# Patient Record
Sex: Female | Born: 1935 | Race: White | Hispanic: No | Marital: Married | Smoking: Former smoker
Health system: Southern US, Community
[De-identification: ages and names within clinical notes are randomized; demographics above are authoritative.]

## PROBLEM LIST (undated history)

## (undated) DIAGNOSIS — I350 Nonrheumatic aortic (valve) stenosis: Secondary | ICD-10-CM

## (undated) DIAGNOSIS — I2510 Atherosclerotic heart disease of native coronary artery without angina pectoris: Secondary | ICD-10-CM

## (undated) DIAGNOSIS — K922 Gastrointestinal hemorrhage, unspecified: Secondary | ICD-10-CM

## (undated) DIAGNOSIS — E785 Hyperlipidemia, unspecified: Secondary | ICD-10-CM

## (undated) DIAGNOSIS — E669 Obesity, unspecified: Secondary | ICD-10-CM

---
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
## 2016-08-12 DIAGNOSIS — I2510 Atherosclerotic heart disease of native coronary artery without angina pectoris: Secondary | ICD-10-CM

## 2016-08-12 HISTORY — DX: Atherosclerotic heart disease of native coronary artery without angina pectoris: I25.10

## 2017-06-01 ENCOUNTER — Encounter
Admission: EM | Disposition: A | Discharge status: Home / Self Care | Payer: Self-pay | Source: Home / Self Care | Attending: Internal Medicine

## 2017-06-01 ENCOUNTER — Inpatient Hospital Stay
Admission: EM | Admit: 2017-06-01 | Discharge: 2017-06-04 | DRG: 247 | Disposition: A | Discharge status: Home / Self Care | Payer: Medicare Other | Attending: Internal Medicine | Admitting: Internal Medicine

## 2017-06-01 DIAGNOSIS — I2119 ST elevation (STEMI) myocardial infarction involving other coronary artery of inferior wall: Principal | ICD-10-CM | POA: Diagnosis present

## 2017-06-01 DIAGNOSIS — F17210 Nicotine dependence, cigarettes, uncomplicated: Secondary | ICD-10-CM | POA: Diagnosis present

## 2017-06-01 DIAGNOSIS — R079 Chest pain, unspecified: Secondary | ICD-10-CM | POA: Diagnosis not present

## 2017-06-01 DIAGNOSIS — M79604 Pain in right leg: Secondary | ICD-10-CM

## 2017-06-01 DIAGNOSIS — N179 Acute kidney failure, unspecified: Secondary | ICD-10-CM | POA: Diagnosis present

## 2017-06-01 DIAGNOSIS — I350 Nonrheumatic aortic (valve) stenosis: Secondary | ICD-10-CM | POA: Diagnosis present

## 2017-06-01 DIAGNOSIS — I959 Hypotension, unspecified: Secondary | ICD-10-CM | POA: Diagnosis present

## 2017-06-01 DIAGNOSIS — R739 Hyperglycemia, unspecified: Secondary | ICD-10-CM | POA: Diagnosis not present

## 2017-06-01 DIAGNOSIS — Z9889 Other specified postprocedural states: Secondary | ICD-10-CM

## 2017-06-01 DIAGNOSIS — I213 ST elevation (STEMI) myocardial infarction of unspecified site: Secondary | ICD-10-CM

## 2017-06-01 DIAGNOSIS — M7989 Other specified soft tissue disorders: Secondary | ICD-10-CM

## 2017-06-01 DIAGNOSIS — E785 Hyperlipidemia, unspecified: Secondary | ICD-10-CM | POA: Diagnosis present

## 2017-06-01 DIAGNOSIS — Z8249 Family history of ischemic heart disease and other diseases of the circulatory system: Secondary | ICD-10-CM

## 2017-06-01 DIAGNOSIS — R109 Unspecified abdominal pain: Secondary | ICD-10-CM

## 2017-06-01 DIAGNOSIS — Z955 Presence of coronary angioplasty implant and graft: Secondary | ICD-10-CM

## 2017-06-01 DIAGNOSIS — I2510 Atherosclerotic heart disease of native coronary artery without angina pectoris: Secondary | ICD-10-CM | POA: Diagnosis present

## 2017-06-01 HISTORY — DX: Atherosclerotic heart disease of native coronary artery without angina pectoris: I25.10

## 2017-06-01 HISTORY — PX: CORONARY STENT INTERVENTION: CATH118234

## 2017-06-01 HISTORY — PX: LEFT HEART CATH AND CORONARY ANGIOGRAPHY: CATH118249

## 2017-06-01 LAB — LIPID PANEL
CHOLESTEROL: 240 mg/dL — AB (ref 0–200)
HDL: 52 mg/dL (ref >40)
LDL CALC: 158 mg/dL — AB (ref 0–99)
Total CHOL/HDL Ratio: 4.6 RATIO
Triglycerides: 148 mg/dL (ref <150)
VLDL: 30 mg/dL (ref 0–40)

## 2017-06-01 LAB — CBC WITH DIFFERENTIAL/PLATELET
BASOS ABS: 0.1 10*3/uL (ref 0–0.1)
Basophils Relative: 1 %
EOS PCT: 3 %
Eosinophils Absolute: 0.3 10*3/uL (ref 0–0.7)
HCT: 46.6 % (ref 35.0–47.0)
Hemoglobin: 15.5 g/dL (ref 12.0–16.0)
LYMPHS ABS: 4.1 10*3/uL — AB (ref 1.0–3.6)
LYMPHS PCT: 39 %
MCH: 29.9 pg (ref 26.0–34.0)
MCHC: 33.2 g/dL (ref 32.0–36.0)
MCV: 90.2 fL (ref 80.0–100.0)
MONO ABS: 1 10*3/uL — AB (ref 0.2–0.9)
Monocytes Relative: 9 %
Neutro Abs: 5.1 10*3/uL (ref 1.4–6.5)
Neutrophils Relative %: 48 %
PLATELETS: 246 10*3/uL (ref 150–440)
RBC: 5.17 MIL/uL (ref 3.80–5.20)
RDW: 13.7 % (ref 11.5–14.5)
WBC: 10.7 10*3/uL (ref 3.6–11.0)

## 2017-06-01 LAB — COMPREHENSIVE METABOLIC PANEL
ALK PHOS: 61 U/L (ref 38–126)
ALT: 14 U/L (ref 14–54)
AST: 21 U/L (ref 15–41)
Albumin: 4.2 g/dL (ref 3.5–5.0)
Anion gap: 9 (ref 5–15)
BUN: 24 mg/dL — AB (ref 6–20)
CALCIUM: 9.6 mg/dL (ref 8.9–10.3)
CO2: 25 mmol/L (ref 22–32)
CREATININE: 1.25 mg/dL — AB (ref 0.44–1.00)
Chloride: 104 mmol/L (ref 101–111)
GFR calc non Af Amer: 39 mL/min — ABNORMAL LOW (ref >60)
GFR, EST AFRICAN AMERICAN: 45 mL/min — AB (ref >60)
Glucose, Bld: 162 mg/dL — ABNORMAL HIGH (ref 65–99)
Potassium: 3.8 mmol/L (ref 3.5–5.1)
SODIUM: 138 mmol/L (ref 135–145)
Total Bilirubin: 1.1 mg/dL (ref 0.3–1.2)
Total Protein: 8 g/dL (ref 6.5–8.1)

## 2017-06-01 LAB — PROTIME-INR
INR: 0.99
Prothrombin Time: 13 seconds (ref 11.4–15.2)

## 2017-06-01 LAB — APTT: aPTT: 34 seconds (ref 24–36)

## 2017-06-01 LAB — TROPONIN I: Troponin I: 0.03 ng/mL (ref <0.03)

## 2017-06-01 SURGERY — LEFT HEART CATH AND CORONARY ANGIOGRAPHY
Anesthesia: Moderate Sedation

## 2017-06-01 MED ORDER — TICAGRELOR 90 MG PO TABS
ORAL_TABLET | ORAL | Status: DC | PRN
  Administered 2017-06-01: 180 mg via ORAL

## 2017-06-01 MED ORDER — BIVALIRUDIN BOLUS VIA INFUSION - CUPID
INTRAVENOUS | Status: DC | PRN
  Administered 2017-06-01: 68.025 mg via INTRAVENOUS

## 2017-06-01 MED ORDER — BIVALIRUDIN TRIFLUOROACETATE 250 MG IV SOLR
INTRAVENOUS | Status: AC | PRN
  Administered 2017-06-01: 1.75 mg/kg/h via INTRAVENOUS

## 2017-06-01 MED ORDER — VERAPAMIL HCL 2.5 MG/ML IV SOLN
INTRAVENOUS | Status: DC | PRN
  Administered 2017-06-01: 2.5 mg via INTRAVENOUS

## 2017-06-01 MED ORDER — SODIUM CHLORIDE 0.9 % IV SOLN: INTRAVENOUS | Status: DC

## 2017-06-01 MED ORDER — HEPARIN BOLUS VIA INFUSION
4000.0000 [IU] | Freq: Once | INTRAVENOUS | Status: AC
  Administered 2017-06-01: 4000 [IU] via INTRAVENOUS

## 2017-06-01 MED ORDER — VERAPAMIL HCL 2.5 MG/ML IV SOLN
INTRAVENOUS | Status: AC
  Filled 2017-06-01: qty 2

## 2017-06-01 MED ORDER — LIDOCAINE HCL (PF) 1 % IJ SOLN
INTRAMUSCULAR | Status: AC
  Filled 2017-06-01: qty 30

## 2017-06-01 MED ORDER — TICAGRELOR 90 MG PO TABS
ORAL_TABLET | ORAL | Status: AC
  Filled 2017-06-01: qty 2

## 2017-06-01 MED ORDER — ATROPINE SULFATE 1 MG/10ML IJ SOSY
PREFILLED_SYRINGE | INTRAMUSCULAR | Status: AC
  Filled 2017-06-01: qty 10

## 2017-06-01 MED ORDER — ATROPINE SULFATE 1 MG/10ML IJ SOSY
PREFILLED_SYRINGE | INTRAMUSCULAR | Status: DC | PRN
  Administered 2017-06-01: 1 mg via INTRAVENOUS

## 2017-06-01 MED ORDER — NITROGLYCERIN 5 MG/ML IV SOLN
INTRAVENOUS | Status: AC
Start: 2017-06-01 — End: ?
  Filled 2017-06-01: qty 10

## 2017-06-01 MED ORDER — HEPARIN SODIUM (PORCINE) 5000 UNIT/ML IJ SOLN
INTRAMUSCULAR | Status: AC
  Filled 2017-06-01: qty 1

## 2017-06-01 MED ORDER — NOREPINEPHRINE BITARTRATE 1 MG/ML IV SOLN
INTRAVENOUS | Status: AC
  Filled 2017-06-01: qty 4

## 2017-06-01 MED ORDER — DEXTROSE 5 % IV SOLN
INTRAVENOUS | Status: DC | PRN
  Administered 2017-06-01: 10 ug/min via INTRAVENOUS

## 2017-06-01 MED ORDER — ASPIRIN 81 MG PO CHEW: 324.0000 mg | CHEWABLE_TABLET | Freq: Once | ORAL | Status: DC

## 2017-06-01 MED ORDER — BIVALIRUDIN TRIFLUOROACETATE 250 MG IV SOLR
INTRAVENOUS | Status: AC
  Filled 2017-06-01: qty 250

## 2017-06-01 SURGICAL SUPPLY — 23 items
BALLN TREK RX 2.5X12 (BALLOONS) ×3
BALLN TREK RX 2.75X20 (BALLOONS) ×3
BALLN ~~LOC~~ EUPHORA RX 4.0X20 (BALLOONS) ×3
BALLOON TREK RX 2.5X12 (BALLOONS) ×1 IMPLANT
BALLOON TREK RX 2.75X20 (BALLOONS) ×1 IMPLANT
BALLOON ~~LOC~~ EUPHORA RX 4.0X20 (BALLOONS) ×1 IMPLANT
CATH INFINITI 5FR ANG PIGTAIL (CATHETERS) IMPLANT
CATH INFINITI 5FR JL4 (CATHETERS) ×3 IMPLANT
CATH LAUNCHER 6FR JR4 (CATHETERS) ×3 IMPLANT
DEVICE CLOSURE MYNXGRIP 6/7F (Vascular Products) ×3 IMPLANT
DEVICE INFLAT 30 PLUS (MISCELLANEOUS) ×3 IMPLANT
DEVICE RAD COMP TR BAND LRG (VASCULAR PRODUCTS) ×3 IMPLANT
GLIDESHEATH SLEND SS 6F .021 (SHEATH) ×3 IMPLANT
KIT MANI 3VAL PERCEP (MISCELLANEOUS) ×3 IMPLANT
NEEDLE PERC 18GX7CM (NEEDLE) ×3 IMPLANT
PACK CARDIAC CATH (CUSTOM PROCEDURE TRAY) ×3 IMPLANT
SHEATH AVANTI 6FR X 11CM (SHEATH) ×3 IMPLANT
STENT SIERRA 3.50 X 33 MM (Permanent Stent) ×3 IMPLANT
WIRE EMERALD 3MM-J .035X150CM (WIRE) ×3 IMPLANT
WIRE EMERALD ST .035X150CM (WIRE) ×3 IMPLANT
WIRE HITORQ VERSACORE ST 145CM (WIRE) ×3 IMPLANT
WIRE ROSEN-J .035X260CM (WIRE) ×3 IMPLANT
WIRE RUNTHROUGH .014X180CM (WIRE) ×3 IMPLANT

## 2017-06-01 NOTE — ED Triage Notes (Signed)
Pt arrives via [facility] with Code Stemi. Pt had STEMI on [facility] 12 lead. Pt reports chest pain 2-3 days with a sudden increase in the last three hours. EMS administered 324 mg baby aspirin en route. Pt is pale and diaphoretic at this time in triage.

## 2017-06-01 NOTE — ED Provider Notes (Signed)
[facility] Emergency Department Provider Note   ____________________________________________   First MD Initiated Contact with Patient 06/01/17 2229     (approximate)  I have reviewed the triage vital signs and the nursing notes.   HISTORY  Chief Complaint Code STEMI  EM caveat: Limited somewhat by time in patients time sensitive need for intervention and catheterization  HPI Monica [name] is a 83 y.o. female here for evaluation of chest pain  Patient presents for evaluation of chest pain.  Felt as though she had acid reflux this morning but began having worsening chest pain tonight.  Seen by EMS, given 324 of aspirin noted to have mild hypotension in route.  Patient activated as a STEMI  Patient reports she only takes ibuprofen, no other medications and denies significant medical history.  She is a smoker.  No trouble breathing.  No back pain.  No abdominal pain.  No headache nausea or vomiting.  No past medical history on file. Denies previous history There are no active problems to display for this patient.   No past surgical history on file.  Prior to Admission medications   Not on File  Takes no medication  Allergies Patient has no known allergies.  No family history on file.  Social History Social History  Substance Use Topics  . Smoking status: Not on file  . Smokeless tobacco: Not on file  . Alcohol use Not on file  Does smoke, no illegal drug use no illegal drug  Review of Systems Constitutional: No fever/chills Respiratory: Denies shortness of breath. Gastrointestinal: No abdominal pain.  No nausea, no vomiting.  Musculoskeletal: Negative for back pain. Neurological: Negative for headaches  EM caveat    ____________________________________________   PHYSICAL EXAM:  VITAL SIGNS: ED Triage Vitals  Enc Vitals Group     BP 06/01/17 2232 (!) 155/107     Pulse Rate 06/01/17 2232 (!) 107     Resp --      Temp  06/01/17 2232 (!) 97.5 F (36.4 C)     Temp Source 06/01/17 2232 Oral     SpO2 06/01/17 2232 98 %     Weight 06/01/17 2235 200 lb (90.7 kg)     Height 06/01/17 2235 5\' 3"  (1.6 m)     Head Circumference --      Peak Flow --      Pain Score 06/01/17 2228 10     Pain Loc --      Pain Edu? --      Excl. in GC? --     Constitutional: Alert and oriented.  Mildly ill-appearing but in no distress eyes: Conjunctivae are normal. Head: Atraumatic. Neck: No stridor.   Cardiovascular: Normal rate, regular rhythm. Grossly normal heart sounds.  Good peripheral circulation. Respiratory: Normal respiratory effort.  No retractions. Lungs CTAB for may be some very slight wheezing in the lower lobes bilateral.  Speaks in full clear sentences Gastrointestinal: Soft and nontender. Musculoskeletal: No lower extremity tenderness nor edema. Neurologic:  Normal speech and language. No gross focal neurologic deficits are appreciated.  Skin:  Skin is warm, dry and intact. No rash noted. Psychiatric: Mood and affect are normal.  ____________________________________________   LABS (all labs ordered are listed, but only abnormal results are displayed)  Labs Reviewed  CBC WITH DIFFERENTIAL/PLATELET - Abnormal; Notable for the following:       Result Value   Lymphs Abs 4.1 (*)    Monocytes Absolute 1.0 (*)    All other  components within normal limits  PROTIME-INR  APTT  COMPREHENSIVE METABOLIC PANEL  TROPONIN I  LIPID PANEL   ____________________________________________  EKG  Reviewed and entered by me at 2232 Heart rate 105 QRS 90 QTC 420 Sinus tachycardia, obvious ST elevation involving the inferior and anterior region ____________________________________________  RADIOLOGY   ____________________________________________   PROCEDURES  Procedure(s) performed: None  Procedures  Critical Care performed: Yes, see critical care note(s)  CRITICAL CARE Performed by: Sharyn CreamerQUALE,  Violette Morneault   Total critical care time: 20 minutes  Critical care time was exclusive of separately billable procedures and treating other patients.  Critical care was necessary to treat or prevent imminent or life-threatening deterioration.  Critical care was time spent personally by me on the following activities: development of treatment plan with patient and/or surrogate as well as nursing, discussions with consultants, evaluation of patient's response to treatment, examination of patient, obtaining history from patient or surrogate, ordering and performing treatments and interventions, ordering and review of laboratory studies, ordering and review of radiographic studies, pulse oximetry and re-evaluation of patient's condition.  ____________________________________________   INITIAL IMPRESSION / ASSESSMENT AND PLAN / ED COURSE  Pertinent labs & imaging results that were available during my care of the patient were reviewed by me and considered in my medical decision making (see chart for details).    Clinical Course as of Jun 01 2253  Wynelle LinkSun Jun 01, 2017  2229 Dr. Kirke CorinArida at bedside.  [MQ]    Clinical Course User Index [MQ] Sharyn CreamerQuale, Miamor Ayler, MD   Patient presents for chest pain with EKG demonstrating ST elevation MI.  Patient given heparin bolus per recommendation of Dr. Kirke CorinArida.  Patient being taken for emergency cath with Dr. Kirke CorinArida.  The patient was alert, stable hemodynamics at the time of transfer to the Cath Lab with the Cath Lab team.  ____________________________________________   FINAL CLINICAL IMPRESSION(S) / ED DIAGNOSES  Final diagnoses:  ST elevation myocardial infarction (STEMI), unspecified artery (HCC)      NEW MEDICATIONS STARTED DURING THIS VISIT:  There are no discharge medications for this patient.    Note:  This document was prepared using Dragon voice recognition software and may include unintentional dictation errors.     Sharyn CreamerQuale, Kariel Skillman, MD 06/01/17 [phone]

## 2017-06-01 NOTE — ED Notes (Signed)
Pt transported to Cath Lab at this time with this RN, Mackey BirchwoodPaulette RN, and MD Kirke CorinArida.

## 2017-06-02 ENCOUNTER — Encounter: Payer: Self-pay | Admitting: Cardiovascular Disease

## 2017-06-02 ENCOUNTER — Inpatient Hospital Stay (HOSPITAL_COMMUNITY)
Admit: 2017-06-02 | Discharge: 2017-06-02 | Disposition: A | Discharge status: Home / Self Care | Payer: Medicare Other | Attending: Cardiovascular Disease | Admitting: Cardiovascular Disease

## 2017-06-02 ENCOUNTER — Inpatient Hospital Stay: Payer: Medicare Other

## 2017-06-02 DIAGNOSIS — I340 Nonrheumatic mitral (valve) insufficiency: Secondary | ICD-10-CM

## 2017-06-02 DIAGNOSIS — I25118 Atherosclerotic heart disease of native coronary artery with other forms of angina pectoris: Secondary | ICD-10-CM | POA: Diagnosis not present

## 2017-06-02 DIAGNOSIS — I2510 Atherosclerotic heart disease of native coronary artery without angina pectoris: Secondary | ICD-10-CM | POA: Diagnosis not present

## 2017-06-02 DIAGNOSIS — N179 Acute kidney failure, unspecified: Secondary | ICD-10-CM | POA: Diagnosis present

## 2017-06-02 DIAGNOSIS — I2111 ST elevation (STEMI) myocardial infarction involving right coronary artery: Secondary | ICD-10-CM | POA: Diagnosis not present

## 2017-06-02 DIAGNOSIS — Z8249 Family history of ischemic heart disease and other diseases of the circulatory system: Secondary | ICD-10-CM | POA: Diagnosis not present

## 2017-06-02 DIAGNOSIS — I351 Nonrheumatic aortic (valve) insufficiency: Secondary | ICD-10-CM

## 2017-06-02 DIAGNOSIS — K922 Gastrointestinal hemorrhage, unspecified: Secondary | ICD-10-CM | POA: Diagnosis not present

## 2017-06-02 DIAGNOSIS — M79604 Pain in right leg: Secondary | ICD-10-CM | POA: Diagnosis not present

## 2017-06-02 DIAGNOSIS — I2119 ST elevation (STEMI) myocardial infarction involving other coronary artery of inferior wall: Secondary | ICD-10-CM | POA: Diagnosis present

## 2017-06-02 DIAGNOSIS — R112 Nausea with vomiting, unspecified: Secondary | ICD-10-CM | POA: Diagnosis not present

## 2017-06-02 DIAGNOSIS — E785 Hyperlipidemia, unspecified: Secondary | ICD-10-CM | POA: Diagnosis present

## 2017-06-02 DIAGNOSIS — F17210 Nicotine dependence, cigarettes, uncomplicated: Secondary | ICD-10-CM | POA: Diagnosis present

## 2017-06-02 DIAGNOSIS — R739 Hyperglycemia, unspecified: Secondary | ICD-10-CM | POA: Diagnosis not present

## 2017-06-02 DIAGNOSIS — Z955 Presence of coronary angioplasty implant and graft: Secondary | ICD-10-CM | POA: Diagnosis not present

## 2017-06-02 DIAGNOSIS — I350 Nonrheumatic aortic (valve) stenosis: Secondary | ICD-10-CM | POA: Diagnosis present

## 2017-06-02 DIAGNOSIS — R197 Diarrhea, unspecified: Secondary | ICD-10-CM | POA: Diagnosis not present

## 2017-06-02 DIAGNOSIS — R1030 Lower abdominal pain, unspecified: Secondary | ICD-10-CM | POA: Diagnosis not present

## 2017-06-02 DIAGNOSIS — M7989 Other specified soft tissue disorders: Secondary | ICD-10-CM | POA: Diagnosis not present

## 2017-06-02 DIAGNOSIS — R079 Chest pain, unspecified: Secondary | ICD-10-CM | POA: Diagnosis present

## 2017-06-02 DIAGNOSIS — I959 Hypotension, unspecified: Secondary | ICD-10-CM | POA: Diagnosis present

## 2017-06-02 LAB — BASIC METABOLIC PANEL WITH GFR
Anion gap: 3 — ABNORMAL LOW (ref 5–15)
BUN: 22 mg/dL — ABNORMAL HIGH (ref 6–20)
CO2: 24 mmol/L (ref 22–32)
Calcium: 8.8 mg/dL — ABNORMAL LOW (ref 8.9–10.3)
Chloride: 109 mmol/L (ref 101–111)
Creatinine, Ser: 1.03 mg/dL — ABNORMAL HIGH (ref 0.44–1.00)
GFR calc Af Amer: 57 mL/min — ABNORMAL LOW
GFR calc non Af Amer: 50 mL/min — ABNORMAL LOW
Glucose, Bld: 120 mg/dL — ABNORMAL HIGH (ref 65–99)
Potassium: 4.3 mmol/L (ref 3.5–5.1)
Sodium: 136 mmol/L (ref 135–145)

## 2017-06-02 LAB — CBC
HCT: 41.1 % (ref 35.0–47.0)
Hemoglobin: 13.8 g/dL (ref 12.0–16.0)
MCH: 30.3 pg (ref 26.0–34.0)
MCHC: 33.5 g/dL (ref 32.0–36.0)
MCV: 90.4 fL (ref 80.0–100.0)
PLATELETS: 216 10*3/uL (ref 150–440)
RBC: 4.55 MIL/uL (ref 3.80–5.20)
RDW: 13.5 % (ref 11.5–14.5)
WBC: 9.8 10*3/uL (ref 3.6–11.0)

## 2017-06-02 LAB — ECHOCARDIOGRAM COMPLETE
HEIGHTINCHES: 63 in
WEIGHTICAEL: 3200 [oz_av]

## 2017-06-02 LAB — MRSA PCR SCREENING: MRSA by PCR: NEGATIVE

## 2017-06-02 LAB — POCT ACTIVATED CLOTTING TIME: ACTIVATED CLOTTING TIME: 279 s

## 2017-06-02 LAB — PHOSPHORUS: Phosphorus: 3.6 mg/dL (ref 2.5–4.6)

## 2017-06-02 LAB — GLUCOSE, CAPILLARY: GLUCOSE-CAPILLARY: 130 mg/dL — AB (ref 65–99)

## 2017-06-02 LAB — HEMOGLOBIN A1C
Hgb A1c MFr Bld: 5.7 % — ABNORMAL HIGH (ref 4.8–5.6)
Mean Plasma Glucose: 116.89 mg/dL

## 2017-06-02 LAB — MAGNESIUM: Magnesium: 2 mg/dL (ref 1.7–2.4)

## 2017-06-02 LAB — TROPONIN I: Troponin I: 13 ng/mL

## 2017-06-02 MED ORDER — IOPAMIDOL (ISOVUE-300) INJECTION 61%
INTRAVENOUS | Status: DC | PRN
  Administered 2017-06-02: 150 mL via INTRA_ARTERIAL

## 2017-06-02 MED ORDER — ONDANSETRON HCL 4 MG/2ML IJ SOLN
4.0000 mg | Freq: Four times a day (QID) | INTRAMUSCULAR | Status: DC | PRN
  Filled 2017-06-02: qty 2

## 2017-06-02 MED ORDER — SODIUM CHLORIDE 0.9 % IV BOLUS (SEPSIS)
500.0000 mL | Freq: Once | INTRAVENOUS | Status: AC
  Administered 2017-06-02: 500 mL via INTRAVENOUS

## 2017-06-02 MED ORDER — SODIUM CHLORIDE 0.9 % IV BOLUS (SEPSIS): 1000.0000 mL | Freq: Once | INTRAVENOUS | Status: DC

## 2017-06-02 MED ORDER — MORPHINE SULFATE (PF) 2 MG/ML IV SOLN
2.0000 mg | INTRAVENOUS | Status: DC | PRN
  Administered 2017-06-02: 2 mg via INTRAVENOUS
  Filled 2017-06-02 (×2): qty 1

## 2017-06-02 MED ORDER — SODIUM CHLORIDE 0.9 % IV SOLN
0.2500 mg/kg/h | INTRAVENOUS | Status: AC
  Administered 2017-06-02: 0.25 mg/kg/h via INTRAVENOUS
  Filled 2017-06-02: qty 250

## 2017-06-02 MED ORDER — SIMETHICONE 80 MG PO CHEW
80.0000 mg | CHEWABLE_TABLET | Freq: Once | ORAL | Status: DC
  Filled 2017-06-02: qty 1

## 2017-06-02 MED ORDER — NITROGLYCERIN 0.4 MG SL SUBL
SUBLINGUAL_TABLET | SUBLINGUAL | Status: AC
  Administered 2017-06-02: 0.4 mg via SUBLINGUAL
  Filled 2017-06-02: qty 1

## 2017-06-02 MED ORDER — ATORVASTATIN CALCIUM 20 MG PO TABS
80.0000 mg | ORAL_TABLET | Freq: Every day | ORAL | Status: DC
  Administered 2017-06-02 – 2017-06-03 (×2): 80 mg via ORAL
  Filled 2017-06-02 (×2): qty 4

## 2017-06-02 MED ORDER — ALUM & MAG HYDROXIDE-SIMETH 200-200-20 MG/5ML PO SUSP
15.0000 mL | Freq: Once | ORAL | Status: AC
  Administered 2017-06-02: 15 mL via ORAL
  Filled 2017-06-02: qty 30

## 2017-06-02 MED ORDER — CARVEDILOL 3.125 MG PO TABS
3.1250 mg | ORAL_TABLET | Freq: Two times a day (BID) | ORAL | Status: DC
  Administered 2017-06-02 – 2017-06-04 (×5): 3.125 mg via ORAL
  Filled 2017-06-02 (×5): qty 1

## 2017-06-02 MED ORDER — TICAGRELOR 90 MG PO TABS
90.0000 mg | ORAL_TABLET | Freq: Two times a day (BID) | ORAL | Status: DC
  Administered 2017-06-02 – 2017-06-04 (×5): 90 mg via ORAL
  Filled 2017-06-02 (×6): qty 1

## 2017-06-02 MED ORDER — ACETAMINOPHEN 325 MG PO TABS
650.0000 mg | ORAL_TABLET | ORAL | Status: DC | PRN
  Administered 2017-06-04: 650 mg via ORAL
  Filled 2017-06-02 (×2): qty 2

## 2017-06-02 MED ORDER — ONDANSETRON HCL 4 MG/2ML IJ SOLN
4.0000 mg | Freq: Once | INTRAMUSCULAR | Status: AC
  Administered 2017-06-02: 4 mg via INTRAVENOUS

## 2017-06-02 MED ORDER — NITROGLYCERIN 0.4 MG SL SUBL
0.4000 mg | SUBLINGUAL_TABLET | SUBLINGUAL | Status: DC | PRN
  Administered 2017-06-02 (×2): 0.4 mg via SUBLINGUAL

## 2017-06-02 MED ORDER — SODIUM CHLORIDE 0.9% FLUSH
3.0000 mL | Freq: Two times a day (BID) | INTRAVENOUS | Status: DC
  Administered 2017-06-02 – 2017-06-03 (×4): 3 mL via INTRAVENOUS

## 2017-06-02 MED ORDER — SODIUM CHLORIDE 0.9 % IV SOLN
INTRAVENOUS | Status: DC
  Administered 2017-06-02 (×2): via INTRAVENOUS

## 2017-06-02 MED ORDER — SODIUM CHLORIDE 0.9 % IV SOLN: 250.0000 mL | INTRAVENOUS | Status: DC | PRN

## 2017-06-02 MED ORDER — SODIUM CHLORIDE 0.9% FLUSH: 3.0000 mL | INTRAVENOUS | Status: DC | PRN

## 2017-06-02 MED ORDER — ASPIRIN 81 MG PO CHEW
81.0000 mg | CHEWABLE_TABLET | Freq: Every day | ORAL | Status: DC
  Administered 2017-06-02 – 2017-06-04 (×3): 81 mg via ORAL
  Filled 2017-06-02 (×3): qty 1

## 2017-06-02 MED ORDER — ALUM & MAG HYDROXIDE-SIMETH 200-200-20 MG/5ML PO SUSP
15.0000 mL | ORAL | Status: DC | PRN
  Filled 2017-06-02: qty 30

## 2017-06-02 NOTE — Plan of Care (Signed)
Problem: Food- and Nutrition-Related Knowledge Deficit (NB-1.1) Goal: Nutrition education Formal process to instruct or train a patient/client in a skill or to impart knowledge to help patients/clients voluntarily manage or modify food choices and eating behavior to maintain or improve health. Outcome: Completed/Met Date Met: 06/02/17 Nutrition Education Note  RD consulted for nutrition education regarding a Heart Healthy diet.   Lipid Panel     Component Value Date/Time   CHOL 240 (H) 06/01/2017 2228   TRIG 148 06/01/2017 2228   HDL 52 06/01/2017 2228   CHOLHDL 4.6 06/01/2017 2228   VLDL 30 06/01/2017 2228   LDLCALC 158 (H) 06/01/2017 2228   Spoke with patient at bedside. She reports she has never received education on a heart healthy diet before, but is motivated to learn about it. She has not seen a doctor for 30 years. She lives at home with her husband. She reports she does not always eat a set meal pattern, but snacks during the day. She may have a piece of pie for a meal, or crackers and cheese. When she does cook meals she reports cooking with a lot of butter. She drinks water and coke during the day.  RD provided "Heart Healthy Nutrition Therapy" handout from the Academy of [employer]. Reviewed patient's dietary recall. Provided examples on ways to decrease sodium and fat intake in diet. Discouraged intake of processed foods and use of salt shaker. Encouraged fresh fruits and vegetables as well as whole grain sources of carbohydrates to maximize fiber intake. Teach back method used.  Expect good compliance.  Body mass index is 35.43 kg/m. Pt meets criteria for Obesity Class II based on current BMI.  Current diet order is Heart Healthy. Meal completion not recorded in chart, but patient reports she is eating well, finishing most of her meals, and has a good appetite. Labs and medications reviewed. No further nutrition interventions warranted at this time. RD contact  information provided. If additional nutrition issues arise, please re-consult RD.  Willey Blade, MS, [place], LDN Office: [phone] Pager: [phone] After Hours/Weekend Pager: [phone]

## 2017-06-02 NOTE — Progress Notes (Signed)
Pt arrive to floor from cath lab around 0045 on 06/02/17, her vital signs were stable but she continue to have ingestion, pain and belching numberous times. Contacted MD Kirke CorinArida and he order some Tylenol and Maalox and to continue to monitor.  Pt continue to have issues though out the night and NP Maggie ordered NTG and Morphine. Pt states she felt better after medication. Pt vital signs are stable at 0700.

## 2017-06-02 NOTE — Consult Note (Signed)
Cardiology Consultation:   Patient ID: Monica [name] Kossman; [id number]; 07-27-1934   Admit date: 06/01/2017 Date of Consult: 06/02/2017  Primary Care Provider: Patient, No Pcp Per Primary Cardiologist: Ellis ParentsNew Kirke Corin(Arida) Primary Electrophysiologist:  None   Patient Profile:   Monica [name] Staffa is a 83 y.o. female with a hx of Tobacco use who is being seen today for the evaluation of chest pain and abnormal EKG at the request of Dr. Fanny BienQuale.  History of Present Illness:   Monica [name] is an 83 year old female who does not see a primary care physician on a regular basis and has no prior cardiac history. She has prolonged history of tobacco use. She does not take any medications on a regular basis. She reports intermittent exertional chest pain over the last few weeks. However, this evening shortly after dinner, she started having severe substernal chest pain which was associated with shortness of breath and diaphoresis. The pain was very severe in intensity thus she called EMS. EKG at the field showed inferior ST elevation. A code STEMI was activated. The patient was mildly hypotensive in route. She was given aspirin and then was given 4000 units of unfractionated heparin in the emergency room. I recommended proceeding with emergent cardiac catheterization and possible PCI.  No past medical history on file.  No past surgical history on file.   Home Medications:  Prior to Admission medications   Not on File    Inpatient Medications: Scheduled Meds: . [MAR Hold] aspirin  324 mg Oral Once   Continuous Infusions: . sodium chloride     PRN Meds:   Allergies:   No Known Allergies  Social History:   Social History   Social History  . Marital status: Married    Spouse name: N/A  . Number of children: N/A  . Years of education: N/A   Occupational History  . Not on file.   Social History Main Topics  . Smoking status: Not on file  . Smokeless tobacco: Not on file  . Alcohol use Not on  file  . Drug use: Unknown  . Sexual activity: Not on file   Other Topics Concern  . Not on file   Social History Narrative  . No narrative on file    Family History:   No family history of premature coronary artery disease.  ROS:  Please see the history of present illness.  ROS  All other ROS reviewed and negative.     Physical Exam/Data:   Vitals:   06/01/17 2234 06/01/17 2235 06/01/17 2235 06/01/17 2250  BP:      Pulse:   (!) 110   Resp: (!) 21     Temp:      TempSrc:      SpO2:   98% 94%  Weight:  200 lb (90.7 kg)    Height:  5\' 3"  (1.6 m)     No intake or output data in the 24 hours ending 06/02/17 0038 Filed Weights   06/01/17 2235  Weight: 200 lb (90.7 kg)   Body mass index is 35.43 kg/m.  General:  Well nourished, well developed, in Mild distress due to pain. HEENT: normal Lymph: no adenopathy Neck: no JVD Endocrine:  No thryomegaly Vascular:  FA pulses 2+ bilaterally without bruits  Cardiac:  normal S1, S2; RRR; 2/6 crescendo decrescendo systolic murmur which is mid peaking Lungs:  clear to auscultation bilaterally with diminished breath sounds, no wheezing, rhonchi or rales  Abd: soft, nontender, no hepatomegaly  Ext: no edema  Musculoskeletal:  No deformities, BUE and BLE strength normal and equal Skin: warm and dry  Neuro:  CNs 2-12 intact, no focal abnormalities noted Psych:  Normal affect   EKG:  The EKG was personally reviewed and demonstrates: Normal sinus rhythm with at least 4 mm inferior ST elevation with reciprocal changes in the anterior leads   Relevant CV Studies: Cardiac catheterization: 1. Severe one-vessel coronary artery disease with thrombotic occlusion of the proximal and mid right coronary artery. Moderate ostial left circumflex stenosis and mild LAD disease. 2. At least moderate aortic stenosis with a peak to peak gradient of 22 mmHg. Normal left ventricular end-diastolic pressure. 3. Successful angioplasty and drug-eluting  stent placement to the proximal right coronary artery extending into the midsegment. 4. Severe hypotension and bradycardia after opening the right coronary artery  likely due to neurohormonal reflects and required treatment with atropine and norepinephrine drip which was discontinued at the end of the case.  Recommendations: Dual antiplatelet therapy for at least one year. Aggressive treatment of risk factors and smoking cessation. Obtain an echocardiogram to evaluate aortic stenosis and LV systolic function. Avoid catheterization via the right radial artery in the future due to right radial loop.  Laboratory Data:  Chemistry Recent Labs Lab 06/01/17 2228  NA 138  K 3.8  CL 104  CO2 25  GLUCOSE 162*  BUN 24*  CREATININE 1.25*  CALCIUM 9.6  GFRNONAA 39*  GFRAA 45*  ANIONGAP 9     Recent Labs Lab 06/01/17 2228  PROT 8.0  ALBUMIN 4.2  AST 21  ALT 14  ALKPHOS 61  BILITOT 1.1   Hematology Recent Labs Lab 06/01/17 2228  WBC 10.7  RBC 5.17  HGB 15.5  HCT 46.6  MCV 90.2  MCH 29.9  MCHC 33.2  RDW 13.7  PLT 246   Cardiac Enzymes Recent Labs Lab 06/01/17 2228  TROPONINI 0.03*   No results for input(s): TROPIPOC in the last 168 hours.  BNPNo results for input(s): BNP, PROBNP in the last 168 hours.  DDimer No results for input(s): DDIMER in the last 168 hours.  Radiology/Studies:  No results found.  Assessment and Plan:   1. Acute inferior ST elevation myocardial infarction: The patient was given aspirin and heparin. Emergent cardiac catheterization was performed which showed an occluded right coronary artery which was the culprit. I performed successful angioplasty and drug-eluting stent placement. There was mild to moderate disease in the left system and there was evidence of at least moderate aortic stenosis. The patient developed severe bradycardia and hypotension after opening the right coronary artery likely due to neurohormonal reflex . This was treated with  atropine and norepinephrine drip which was discontinued at the end of the case. I recommend dual antiplatelet therapy for at least one year. The patient will be admitted to the intensive care unit with close monitoring. Start small dose carvedilol once blood pressure tolerates. I ordered an echocardiogram. 2.  aortic stenosis: At least moderate by cardiac catheterization with peak to peak gradient of 22 mmHg. Obtain an echocardiogram for evaluation. 3. Tobacco use: Smoking cessation is strongly advised.   For questions or updates, please contact [facility] Please consult [email or web address] for contact info under Cardiology/STEMI.   Signed, Lorine Bears, MD  06/02/2017 12:38 AM

## 2017-06-02 NOTE — Progress Notes (Signed)
Progress Note  Patient Name: Monica [name] Date of Encounter: 06/02/2017  Primary Cardiologist: New to [facility] - consult by Arida  Subjective   No further chest pain. No SOB. Off Levophed. Vitals stable. Troponin 0.03 on 10/21, pending this AM. Tolerating medications without issues.   Inpatient Medications    Scheduled Meds: . aspirin  324 mg Oral Once  . aspirin  81 mg Oral Daily  . atorvastatin  80 mg Oral q1800  . carvedilol  3.125 mg Oral BID WC  . sodium chloride flush  3 mL Intravenous Q12H  . ticagrelor  90 mg Oral BID   Continuous Infusions: . sodium chloride    . sodium chloride 100 mL/hr at 06/02/17 0215  . sodium chloride     PRN Meds: sodium chloride, acetaminophen, alum & mag hydroxide-simeth, morphine injection, nitroGLYCERIN, ondansetron (ZOFRAN) IV, sodium chloride flush   Vital Signs    Vitals:   06/02/17 0200 06/02/17 0400 06/02/17 0500 06/02/17 0600  BP: (!) 137/98 111/70 (!) 82/42 104/61  Pulse: (!) 105 88 90 78  Resp: 17 (!) 22 20 20   Temp:  (!) 97.2 F (36.2 C)    TempSrc:  Axillary    SpO2: 97% 95% 92% 95%  Weight:      Height:        Intake/Output Summary (Last 24 hours) at 06/02/17 0740 Last data filed at 06/02/17 0200  Gross per 24 hour  Intake                0 ml  Output              400 ml  Net             -400 ml   Filed Weights   06/01/17 2235  Weight: 200 lb (90.7 kg)    Telemetry    NSR, 6 beats of NSVT - Personally Reviewed  ECG    n/a - Personally Reviewed  Physical Exam   GEN: No acute distress.   Neck: No JVD Cardiac: RRR, II/VI systolic murmur, no rubs, or gallops. Cath site without bleeding, bruising, swelling, erythema or TTP. Pulse 2+.  Respiratory: Clear to auscultation bilaterally. GI: Soft, nontender, non-distended  MS: No edema; No deformity. Neuro:  Nonfocal  Psych: Normal affect   Labs    Chemistry Recent Labs Lab 06/01/17 2228 06/02/17 0615  NA 138 136  K 3.8 4.3  CL 104 109  CO2 25  24  GLUCOSE 162* 120*  BUN 24* 22*  CREATININE 1.25* 1.03*  CALCIUM 9.6 8.8*  PROT 8.0  --   ALBUMIN 4.2  --   AST 21  --   ALT 14  --   ALKPHOS 61  --   BILITOT 1.1  --   GFRNONAA 39* 50*  GFRAA 45* 57*  ANIONGAP 9 3*     Hematology Recent Labs Lab 06/01/17 2228 06/02/17 0615  WBC 10.7 9.8  RBC 5.17 4.55  HGB 15.5 13.8  HCT 46.6 41.1  MCV 90.2 90.4  MCH 29.9 30.3  MCHC 33.2 33.5  RDW 13.7 13.5  PLT 246 216    Cardiac Enzymes Recent Labs Lab 06/01/17 2228  TROPONINI 0.03*   No results for input(s): TROPIPOC in the last 168 hours.   BNPNo results for input(s): BNP, PROBNP in the last 168 hours.   DDimer No results for input(s): DDIMER in the last 168 hours.   Radiology    Dg Abd 1 View  Addendum Date:  06/02/2017   ADDENDUM REPORT: 06/02/2017 07:23 ADDENDUM: Aortic Atherosclerosis (ICD10-I70.0). Aortic aneurysm NOS (ICD10-I71.9). Electronically Signed   By: Bretta BangWilliam  Woodruff III M.D.   On: 06/02/2017 07:23   Result Date: 06/02/2017 CLINICAL DATA:  Abdominal pain and distention EXAM: ABDOMEN - 1 VIEW COMPARISON:  None. FINDINGS: Stomach is mildly distended with air. There is no appreciable small or large bowel dilatation. No air-fluid levels. No free air evident. There is contrast in the urinary bladder. There is aortic atherosclerosis with apparent aneurysmal dilatation of the distal aorta measuring 4.8 cm by radiography. IMPRESSION: 1. Evidence of abdominal aortic aneurysm. This finding warrants ultrasound or CT to further characterize. 2. Stomach mildly distended with air. No bowel obstruction or free air evident. 3.  Contrast noted in urinary bladder. Electronically Signed: By: Bretta BangWilliam  Woodruff III M.D. On: 06/02/2017 07:17   Dg Chest Port 1 View  Result Date: 06/02/2017 CLINICAL DATA:  Status post cardiac catheterization with echocardiogram changes suggesting acute infarct EXAM: PORTABLE CHEST 1 VIEW COMPARISON:  None. FINDINGS: There is a degree of  interstitial pulmonary edema. No airspace consolidation or volume loss. Heart is mildly enlarged with pulmonary venous hypertension. No adenopathy. No bone lesions. IMPRESSION: Slight cardiomegaly with pulmonary venous hypertension. There is interstitial edema, slightly more on the left than the right. No airspace consolidation. No evident adenopathy. Electronically Signed   By: Bretta BangWilliam  Woodruff III M.D.   On: 06/02/2017 07:18    Cardiac Studies   LHC 06/01/2017: Coronary Findings   Dominance: Right  Left Main  Vessel is angiographically normal.  LM lesion, 20% stenosed.  Left Anterior Descending  There is mild the vessel.  First Diagonal Branch  Vessel is large in size.  Ost 1st Diag to 1st Diag lesion, 20% stenosed.  Third Diagonal Branch  Vessel is small in size. There is mild disease in the vessel.  Left Circumflex  Ost Cx to Prox Cx lesion, 50% stenosed.  First Obtuse Marginal Branch  There is mild disease in the vessel.  Second Obtuse Marginal Branch  The vessel exhibits minimal luminal irregularities.  Right Coronary Artery  Prox RCA to Mid RCA lesion, 100% stenosed. The lesion is type C and thrombotic. The lesion is moderately calcified.  Angioplasty: Lesion crossed with guidewire. Pre-stent angioplasty was performed. A drug eluting stent was successfully placed. Post-stent angioplasty was performed. There is no pre-interventional antegrade distal flow (TIMI 0). The post-interventional distal flow is normal (TIMI 3). The intervention was successful . No complications occurred at this lesion.  There is no residual stenosis post intervention.  Mid RCA lesion, 50% stenosed.  Dist RCA lesion, 30% stenosed.  Coronary Diagrams   Diagnostic Diagram       Post-Intervention Diagram        1. Severe one-vessel coronary artery disease with thrombotic occlusion of the proximal and mid right coronary artery. Moderate ostial left circumflex stenosis and mild LAD disease. 2. At  least moderate aortic stenosis with a peak to peak gradient of 22 mmHg. Normal left ventricular end-diastolic pressure. 3. Successful angioplasty and drug-eluting stent placement to the proximal right coronary artery extending into the midsegment. 4. Severe hypotension and bradycardia after opening the right coronary artery  likely due to neurohormonal reflects and required treatment with atropine and norepinephrine drip which was discontinued at the end of the case.  Recommendations: Dual antiplatelet therapy for at least one year. Aggressive treatment of risk factors and smoking cessation. Obtain an echocardiogram to evaluate aortic stenosis and LV systolic function.  Avoid catheterization via the right radial artery in the future due to right radial loop.   TTE pending.   Patient Profile     83 y.o. female with history of tobacco abuse who presented to [facility] on 10/21 with inferior ST elevation MI.   Assessment & Plan    1. Inferior ST elevation MI: -Currently, without chest pain -Troponin pending this morning -Continue DAPT with ASA 81 mg daily and Brilinta 90 mg bid -Echo pending -Coreg -Initially required atropine and Levophed post procedure due to bradycardia and hypotension s/p PCI felt to be 2/2 neurohormonal reflex. Now stable and off supportive care -Keep in ICU for the better part of 10/22 with possible transfer to 2A this PM vs 10/23 -Cardiac rehab  2. Aortic stenosis: -Echo pending -Outpatient follow up  3. HLD: -Goal LDL < 70 -Lipitor -Recheck lipid and liver function in 8 weeks as an outpatient  3. Tobacco abuse: -Cessation advised   4. Hyperglycemia: -A1c pending  For questions or updates, please contact [facility] Please consult [email or web address] for contact info under Cardiology/STEMI.      Signed, Eula Listen, PA-C  06/02/2017, 7:40 AM

## 2017-06-02 NOTE — Progress Notes (Signed)
CRITICAL VALUE ALERT  Critical Value:  Troponin of 13  Date & Time Notied: 06/02/2017 11:25 AM  Provider Notified: MD Kasa  Orders Received/Actions taken: No new orders, will inform MD Arida as well.

## 2017-06-02 NOTE — H&P (Signed)
Monica [name] is an 83 y.o. female.   Chief Complaint: Chest pain HPI: The patient with no previous past medical history presents emergency department complaining of chest pain. She states that she may benefit tonight which was not very strenuous and sat down to rest. She began feeling a sensation like heartburn that radiated to her back. She felt mildly nauseous but did not vomit. She denies lightheadedness or diaphoresis. She admits to feeling similar symptoms the last 4 nights but the pain was more severe tonight which prompted her to seek evaluation in the emergency department. She was found to have an ST elevation myocardial infarction which activated the cardiology team who angioplasty and stent placement. The patient remained stable throughout and is chest pain-free at this time. Following intervention the hospitalist service assumed care of the patient.  Past Medical History:  Diagnosis Date  . CAD (coronary artery disease) 2018    Past Surgical History:  Procedure Laterality Date  . ANKLE RECONSTRUCTION  1956   also ORIF of right arm  . CORONARY STENT INTERVENTION N/A 06/01/2017   Procedure: Coronary/Graft Acute MI Revascularization;  Surgeon: Wellington Hampshire, MD;  Location: [facility];  Service: Cardiovascular;  Laterality: N/A;  . LEFT HEART CATH AND CORONARY ANGIOGRAPHY N/A 06/01/2017   Procedure: LEFT HEART CATH AND CORONARY ANGIOGRAPHY;  Surgeon: Wellington Hampshire, MD;  Location: [facility];  Service: Cardiovascular;  Laterality: N/A;    Family History  Problem Relation Age of Onset  . Valvular heart disease Mother   . Diabetes Mellitus II Daughter    Social History:  has no tobacco, alcohol, and drug history on file.  Allergies: No Known Allergies  No prescriptions prior to admission.    Results for orders placed or performed during the hospital encounter of 06/01/17 (from the past 48 hour(s))  CBC with Differential/Platelet     Status: Abnormal    Collection Time: 06/01/17 10:28 PM  Result Value Ref Range   WBC 10.7 3.6 - 11.0 K/uL   RBC 5.17 3.80 - 5.20 MIL/uL   Hemoglobin 15.5 12.0 - 16.0 g/dL   HCT 46.6 35.0 - 47.0 %   MCV 90.2 80.0 - 100.0 fL   MCH 29.9 26.0 - 34.0 pg   MCHC 33.2 32.0 - 36.0 g/dL   RDW 13.7 11.5 - 14.5 %   Platelets 246 150 - 440 K/uL   Neutrophils Relative % 48 %   Neutro Abs 5.1 1.4 - 6.5 K/uL   Lymphocytes Relative 39 %   Lymphs Abs 4.1 (H) 1.0 - 3.6 K/uL   Monocytes Relative 9 %   Monocytes Absolute 1.0 (H) 0.2 - 0.9 K/uL   Eosinophils Relative 3 %   Eosinophils Absolute 0.3 0 - 0.7 K/uL   Basophils Relative 1 %   Basophils Absolute 0.1 0 - 0.1 K/uL  Protime-INR     Status: None   Collection Time: 06/01/17 10:28 PM  Result Value Ref Range   Prothrombin Time 13.0 11.4 - 15.2 seconds   INR 0.99   APTT     Status: None   Collection Time: 06/01/17 10:28 PM  Result Value Ref Range   aPTT 34 24 - 36 seconds  Comprehensive metabolic panel     Status: Abnormal   Collection Time: 06/01/17 10:28 PM  Result Value Ref Range   Sodium 138 135 - 145 mmol/L   Potassium 3.8 3.5 - 5.1 mmol/L   Chloride 104 101 - 111 mmol/L   CO2  25 22 - 32 mmol/L   Glucose, Bld 162 (H) 65 - 99 mg/dL   BUN 24 (H) 6 - 20 mg/dL   Creatinine, Ser 1.25 (H) 0.44 - 1.00 mg/dL   Calcium 9.6 8.9 - 10.3 mg/dL   Total Protein 8.0 6.5 - 8.1 g/dL   Albumin 4.2 3.5 - 5.0 g/dL   AST 21 15 - 41 U/L   ALT 14 14 - 54 U/L   Alkaline Phosphatase 61 38 - 126 U/L   Total Bilirubin 1.1 0.3 - 1.2 mg/dL   GFR calc non Af Amer 39 (L) >60 mL/min   GFR calc Af Amer 45 (L) >60 mL/min    Comment: (NOTE) The eGFR has been calculated using the CKD EPI equation. This calculation has not been validated in all clinical situations. eGFR's persistently <60 mL/min signify possible Chronic Kidney Disease.    Anion gap 9 5 - 15  Troponin I     Status: Abnormal   Collection Time: 06/01/17 10:28 PM  Result Value Ref Range   Troponin I 0.03 (HH)  <0.03 ng/mL    Comment: CRITICAL RESULT CALLED TO, READ BACK BY AND VERIFIED WITH ANNE PRIDE AT 2304 06/01/17.PMH  Lipid panel     Status: Abnormal   Collection Time: 06/01/17 10:28 PM  Result Value Ref Range   Cholesterol 240 (H) 0 - 200 mg/dL   Triglycerides 148 <150 mg/dL   HDL 52 >40 mg/dL   Total CHOL/HDL Ratio 4.6 RATIO   VLDL 30 0 - 40 mg/dL   LDL Cholesterol 158 (H) 0 - 99 mg/dL    Comment:        Total Cholesterol/HDL:CHD Risk Coronary Heart Disease Risk Table                     Men   Women  1/2 Average Risk   3.4   3.3  Average Risk       5.0   4.4  2 X Average Risk   9.6   7.1  3 X Average Risk  23.4   11.0        Use the calculated Patient Ratio above and the CHD Risk Table to determine the patient's CHD Risk.        ATP III CLASSIFICATION (LDL):  <100     mg/dL   Optimal  100-129  mg/dL   Near or Above                    Optimal  130-159  mg/dL   Borderline  160-189  mg/dL   High  >190     mg/dL   Very High   POCT Activated clotting time     Status: None   Collection Time: 06/01/17 11:20 PM  Result Value Ref Range   Activated Clotting Time 279 seconds  Glucose, capillary     Status: Abnormal   Collection Time: 06/02/17 12:50 AM  Result Value Ref Range   Glucose-Capillary 130 (H) 65 - 99 mg/dL   Comment 1 Notify RN    Comment 2 Document in Chart   MRSA PCR Screening     Status: None   Collection Time: 06/02/17  1:24 AM  Result Value Ref Range   MRSA by PCR NEGATIVE NEGATIVE    Comment:        The GeneXpert MRSA Assay (FDA approved for NASAL specimens only), is one component of a comprehensive MRSA colonization surveillance program. It is not intended  to diagnose MRSA infection nor to guide or monitor treatment for MRSA infections.   CBC     Status: None   Collection Time: 06/02/17  6:15 AM  Result Value Ref Range   WBC 9.8 3.6 - 11.0 K/uL   RBC 4.55 3.80 - 5.20 MIL/uL   Hemoglobin 13.8 12.0 - 16.0 g/dL   HCT 41.1 35.0 - 47.0 %   MCV 90.4  80.0 - 100.0 fL   MCH 30.3 26.0 - 34.0 pg   MCHC 33.5 32.0 - 36.0 g/dL   RDW 13.5 11.5 - 14.5 %   Platelets 216 150 - 440 K/uL  Basic metabolic panel     Status: Abnormal   Collection Time: 06/02/17  6:15 AM  Result Value Ref Range   Sodium 136 135 - 145 mmol/L   Potassium 4.3 3.5 - 5.1 mmol/L   Chloride 109 101 - 111 mmol/L   CO2 24 22 - 32 mmol/L   Glucose, Bld 120 (H) 65 - 99 mg/dL   BUN 22 (H) 6 - 20 mg/dL   Creatinine, Ser 1.03 (H) 0.44 - 1.00 mg/dL   Calcium 8.8 (L) 8.9 - 10.3 mg/dL   GFR calc non Af Amer 50 (L) >60 mL/min   GFR calc Af Amer 57 (L) >60 mL/min    Comment: (NOTE) The eGFR has been calculated using the CKD EPI equation. This calculation has not been validated in all clinical situations. eGFR's persistently <60 mL/min signify possible Chronic Kidney Disease.    Anion gap 3 (L) 5 - 15  Magnesium     Status: None   Collection Time: 06/02/17  6:15 AM  Result Value Ref Range   Magnesium 2.0 1.7 - 2.4 mg/dL  Phosphorus     Status: None   Collection Time: 06/02/17  6:15 AM  Result Value Ref Range   Phosphorus 3.6 2.5 - 4.6 mg/dL   Dg Abd 1 View  Addendum Date: 06/02/2017   ADDENDUM REPORT: 06/02/2017 07:23 ADDENDUM: Aortic Atherosclerosis (ICD10-I70.0). Aortic aneurysm NOS (ICD10-I71.9). Electronically Signed   By: Lowella Grip III M.D.   On: 06/02/2017 07:23   Result Date: 06/02/2017 CLINICAL DATA:  Abdominal pain and distention EXAM: ABDOMEN - 1 VIEW COMPARISON:  None. FINDINGS: Stomach is mildly distended with air. There is no appreciable small or large bowel dilatation. No air-fluid levels. No free air evident. There is contrast in the urinary bladder. There is aortic atherosclerosis with apparent aneurysmal dilatation of the distal aorta measuring 4.8 cm by radiography. IMPRESSION: 1. Evidence of abdominal aortic aneurysm. This finding warrants ultrasound or CT to further characterize. 2. Stomach mildly distended with air. No bowel obstruction or free  air evident. 3.  Contrast noted in urinary bladder. Electronically Signed: By: Lowella Grip III M.D. On: 06/02/2017 07:17   Dg Chest Port 1 View  Result Date: 06/02/2017 CLINICAL DATA:  Status post cardiac catheterization with echocardiogram changes suggesting acute infarct EXAM: PORTABLE CHEST 1 VIEW COMPARISON:  None. FINDINGS: There is a degree of interstitial pulmonary edema. No airspace consolidation or volume loss. Heart is mildly enlarged with pulmonary venous hypertension. No adenopathy. No bone lesions. IMPRESSION: Slight cardiomegaly with pulmonary venous hypertension. There is interstitial edema, slightly more on the left than the right. No airspace consolidation. No evident adenopathy. Electronically Signed   By: Lowella Grip III M.D.   On: 06/02/2017 07:18    Review of Systems  Constitutional: Negative for chills and fever.  HENT: Negative for sore throat and tinnitus.  Eyes: Negative for blurred vision and redness.  Respiratory: Negative for cough and shortness of breath.   Cardiovascular: Positive for chest pain (Resolved). Negative for palpitations, orthopnea and PND.  Gastrointestinal: Negative for abdominal pain, diarrhea, nausea and vomiting.  Genitourinary: Negative for dysuria, frequency and urgency.  Musculoskeletal: Negative for joint pain and myalgias.  Skin: Negative for rash.       No lesions  Neurological: Negative for speech change, focal weakness and weakness.  Endo/Heme/Allergies: Does not bruise/bleed easily.       No temperature intolerance  Psychiatric/Behavioral: Negative for depression and suicidal ideas.    Blood pressure 104/61, pulse 78, temperature (!) 97.2 F (36.2 C), temperature source Axillary, resp. rate 20, height _0  (1.6 m), weight 90.7 kg (200 lb), SpO2 95 %. Physical Exam  Vitals reviewed. Constitutional: She is oriented to person, place, and time. She appears well-developed and well-nourished. No distress.  HENT:  Head:  Normocephalic and atraumatic.  Mouth/Throat: Oropharynx is clear and moist.  Eyes: Pupils are equal, round, and reactive to light. Conjunctivae and EOM are normal. No scleral icterus.  Neck: Normal range of motion. Neck supple. No JVD present. No tracheal deviation present. No thyromegaly present.  Cardiovascular: Normal rate and regular rhythm.  Exam reveals no gallop and no friction rub.   Murmur heard.  Systolic murmur is present with a grade of 3/6  Heard best over aortic area  Respiratory: Effort normal and breath sounds normal.  GI: Soft. Bowel sounds are normal. She exhibits no distension. There is no tenderness.  Genitourinary:  Genitourinary Comments: Deferred  Musculoskeletal: Normal range of motion. She exhibits no edema.  Lymphadenopathy:    She has no cervical adenopathy.  Neurological: She is alert and oriented to person, place, and time. No cranial nerve deficit. She exhibits normal muscle tone.  Skin: Skin is warm and dry. No rash noted. No erythema.  Psychiatric: She has a normal mood and affect. Her behavior is normal. Judgment and thought content normal.     Assessment/Plan This is an 83 year old female admitted for STEMI. 1. ST elevation MI: Status post revascularization; continue to IIb/IIIa platelet inhibitor as well as systemic anticoagulation. Manage blood pressure to decrease myocardial oxygen demand. Carvedilol per cardiology. 2. CAD: Status post drug-eluting stent to RCA. Continue aspirin. Nitroglycerin and morphine for pain as needed. 3. AKI: Prerenal; hydrate with intravenous fluid. Monitor urine output 4. Hyperlipidemia: Elevated LDL; continue high potency statin therapy 5. DVT prophylaxis: Angiomax for now 6. GI prophylaxis: None The patient is a full code. Time spent on admission orders and critical care approximately 45 minutes  Harrie Foreman, MD 06/02/2017, 7:58 AM

## 2017-06-02 NOTE — Progress Notes (Signed)
ANTICOAGULATION CONSULT NOTE - Initial Consult  Pharmacy Consult for Angiomax Indication: chest pain/ACS  No Known Allergies  Patient Measurements: Height: 5\' 3"  (160 cm) Weight: 200 lb (90.7 kg) IBW/kg (Calculated) : 52.4 Heparin Dosing Weight:   Vital Signs: Temp: 97.7 F (36.5 C) (10/22 0045) Temp Source: Oral (10/22 0045) BP: 137/98 (10/22 0200) Pulse Rate: 105 (10/22 0200)  Labs:  Recent Labs  06/01/17 2228  HGB 15.5  HCT 46.6  PLT 246  APTT 34  LABPROT 13.0  INR 0.99  CREATININE 1.25*  TROPONINI 0.03*    Estimated Creatinine Clearance: 37.7 mL/min (A) (by C-G formula based on SCr of 1.25 mg/dL (H)).   Medical History: No past medical history on file.  Medications:  No prescriptions prior to admission.    Assessment: Pharmacy consulted to dose angiomax in this 83 year old S/P cardiac cath.  Per MD consult, only wants to run at 0.25 mg/kg/hr for 4 hrs.  CrCl = 37.7 ml/min   Goal of Therapy:  inhibition platelet aggregation    Plan:  Angiomax 0.25 mg/kg/hr (4.5 ml/hr) IV X 4 hrs ordered per MD request.    Ausha Sieh D 06/02/2017,3:02 AM

## 2017-06-02 NOTE — Progress Notes (Signed)
CH responded to a PG for a Code Stemi. Pt arrived via EMS. Family arrived soon afterwards. CH escorted family to ED waiting room. Pt was taken to Cath Lab and CH was instructed to take the family to the Special procedure waiting room. CH stayed with the family until the RN informed of the Pt progress. Seeing they were in a good space I excused myself. CH is available for follow up.    06/02/17 0000  Clinical Encounter Type  Visited With Patient;Patient and family together;Health care provider  Visit Type Initial;Spiritual support;Code;ED (Code Stemi)  Referral From Nurse  Consult/Referral To Chaplain  Spiritual Encounters  Spiritual Needs Prayer;Emotional

## 2017-06-02 NOTE — Consult Note (Signed)
PULMONARY / CRITICAL CARE MEDICINE   Name: Monica [name] MRN: [id number] DOB: 10-14-1933    ADMISSION DATE:  06/01/2017   CONSULTATION DATE: 06/02/2017  REFERRING MD: Dr. Sheryle Hailiamond  Reason: ICU management Status Post left heart catheterization with PCI and stent placement  CHIEF COMPLAINT: Chest pain  HISTORY OF PRESENT ILLNESS: This is an 83 year old Caucasian female with no past medical history and no visit to healthcare provider in over 30 years who presented to the ED with complaints of sudden onset chest pain that has persisted over 3 days.  Last night pain got excruciating hence she decided to call EMS.  Upon EMS arrival patient was pale and diaphoretic.  She was given aspirin and at the ED she was ruled in for an ST elevation MI.  She was taken to the Cath Lab emergently for left heart catheterization with PCI and stent placement to RCA.  She is being admitted to the ICU for further management status post left heart catheterization with stent placement.   He reports persistent epigastric fullness as well as gas pains denies chest pain.  Very minimal relief with sublingual nitro glycerin.   PAST MEDICAL HISTORY :  She  has no past medical history on file.  PAST SURGICAL HISTORY: She  has no past surgical history on file.  No Known Allergies  No current facility-administered medications on file prior to encounter.    No current outpatient prescriptions on file prior to encounter.    FAMILY HISTORY:  Her has no family status information on file.    SOCIAL HISTORY: She    REVIEW OF SYSTEMS:   Constitutional: Negative for fever and chills.  HENT: Negative for congestion and rhinorrhea.  Eyes: Negative for redness and visual disturbance.  Respiratory: Negative for shortness of breath and wheezing.  Cardiovascular: Negative for chest pain and palpitations.  Gastrointestinal: Positive for nausea and abdominal cramping but negative for vomiting and loose  stools Genitourinary: Negative for dysuria and urgency.  Endocrine: Denies polyuria, polyphagia and heat intolerance Musculoskeletal: Negative for myalgias and arthralgias.  Skin: Negative for pallor and wound.  Neurological: Negative for dizziness and headaches   SUBJECTIVE:   VITAL SIGNS: BP (!) 137/98 (BP Location: Right Arm)   Pulse (!) 105   Temp 97.7 F (36.5 C) (Oral)   Resp 17   Ht 5\' 3"  (1.6 m)   Wt 200 lb (90.7 kg)   SpO2 97%   BMI 35.43 kg/m   HEMODYNAMICS:    VENTILATOR SETTINGS:    INTAKE / OUTPUT: No intake/output data recorded.  PHYSICAL EXAMINATION: General: Well nourished, well developed, Moderate distress Neuro: AAO X4, CNs intact HEENT:  PERRLA, trachea midline, no JVD Cardiovascular: Regular, S1/S2, +murmur, +2 pulses, no edema Lungs: Normal work of breathing, bilateral breath sounds, diminished in the bases, no wheezes or rhonchi Abdomen: Nondistended, hypoactive bowel sounds, palpation reveals no organomegaly, tympanic on percussion Musculoskeletal: Positive range of motion, no deformity Skin: Warm and dry, no cyanosis  LABS:  BMET  Recent Labs Lab 06/01/17 2228  NA 138  K 3.8  CL 104  CO2 25  BUN 24*  CREATININE 1.25*  GLUCOSE 162*    Electrolytes  Recent Labs Lab 06/01/17 2228  CALCIUM 9.6    CBC  Recent Labs Lab 06/01/17 2228  WBC 10.7  HGB 15.5  HCT 46.6  PLT 246    Coag's  Recent Labs Lab 06/01/17 2228  APTT 34  INR 0.99    Sepsis Markers No results for input(s):  LATICACIDVEN, PROCALCITON, O2SATVEN in the last 168 hours.  ABG No results for input(s): PHART, PCO2ART, PO2ART in the last 168 hours.  Liver Enzymes  Recent Labs Lab 06/01/17 2228  AST 21  ALT 14  ALKPHOS 61  BILITOT 1.1  ALBUMIN 4.2    Cardiac Enzymes  Recent Labs Lab 06/01/17 2228  TROPONINI 0.03*    Glucose  Recent Labs Lab 06/02/17 0050  GLUCAP 130*    Imaging No results found.   STUDIES:  LEFT HEART CATH  AND CORONARY ANGIOGRAPHY  Conclusion     Mid RCA lesion, 50 %stenosed.  Dist RCA lesion, 30 %stenosed.  LM lesion, 20 %stenosed.  Ost 1st Diag to 1st Diag lesion, 20 %stenosed.  Ost Cx to Prox Cx lesion, 50 %stenosed.  A drug eluting stent was successfully placed.  Prox RCA to Mid RCA lesion, 100 %stenosed.  Post intervention, there is a 0% residual stenosis.   1. Severe one-vessel coronary artery disease with thrombotic occlusion of the proximal and mid right coronary artery. Moderate ostial left circumflex stenosis and mild LAD disease. 2. At least moderate aortic stenosis with a peak to peak gradient of 22 mmHg. Normal left ventricular end-diastolic pressure. 3. Successful angioplasty and drug-eluting stent placement to the proximal right coronary artery extending into the midsegment. 4. Severe hypotension and bradycardia after opening the right coronary artery  likely due to neurohormonal reflects and required treatment with atropine and norepinephrine drip which was discontinued at the end of the case.  Recommendations: Dual antiplatelet therapy for at least one year. Aggressive treatment of risk factors and smoking cessation. Obtain an echocardiogram to evaluate aortic stenosis and LV systolic function. Avoid catheterization via the right radial artery in the future due to right radial loop.   2D echo pending  CULTURES: None  ANTIBIOTICS: None  SIGNIFICANT EVENTS: 06/02/2017: Admitted, PCI  LINES/TUBES: Peripheral IV  DISCUSSION: 83 year old female presenting with an acute ST elevation MI; now status post left heart catheterization with stent placement to the RCA  ASSESSMENT  Acute inferior wall ST elevation MI status post PCI Heart murmur/aortic stenosis Tobacco use disorder Abdominal pain  PLAN Antiplatelet therapy per cardiology Statin and beta-blocker Hemodynamics but ICU protocol IV fluids Nitroglycerin sublingual as needed for chest  pain Morphine as needed for chest pain Maalox as needed Abdominal x-ray Chest x-ray-status post PCI left Follow-up EKG status post PCI Trend cardiac enzymes Monitor and correct electrolyte imbalances Smoking cessation advice given; patient offered a nicotine patch prn GI and DVT prophylaxis Further changes in treatment plan pending clinical course and diagnostics  FAMILY  - Updates: Patient updated on current treatment plan.  Patient is a full code  - Inter-disciplinary family meet or Palliative Care meeting due by:  day 7  Monica [name] [name] ANP-BC Pulmonary and Critical Care Medicine [facility] Pager [phone] or [phone] 06/02/2017, 3:59 AM

## 2017-06-02 NOTE — Progress Notes (Signed)
This is an 83 year old female admitted for STEMI. The patient is status post stent placement. No chest pain. Vital signs are stable. Physical examinations is unremarkable except systolic heart murmur.   1. ST elevation MI: Status post revascularization; continue to IIb/IIIa platelet inhibitor as well as systemic anticoagulation. Manage blood pressure to decrease myocardial oxygen demand. Carvedilol per cardiology. 2. CAD: Status post drug-eluting stent to RCA. Continue aspirin. Nitroglycerin and morphine for pain as needed. 3. AKI: Prerenal; hydrate with intravenous fluid. Monitor urine output 4. Hyperlipidemia: Elevated LDL; continue high potency statin therapy  Tobacco abuse. Smoking cessation was counseled for 4 minutes. The patient doesn't want a nicotine patch this time.  Time spent 30 minutes.

## 2017-06-02 NOTE — Progress Notes (Signed)
*  PRELIMINARY RESULTS* Echocardiogram 2D Echocardiogram has been performed.  Cristela BlueHege, Stylianos Stradling 06/02/2017, 12:17 PM

## 2017-06-02 NOTE — Progress Notes (Signed)
eLink Physician-Brief Progress Note Patient Name: Monica ArmourSylvia [name] DOB: Oct 03, 1933 MRN: [id number]   Date of Service  06/02/2017  HPI/Events of Note  83 year old with acute inferior wall MI status post stenting of right coronary artery  eICU Interventions  Stable on camera check No eICU interventions.     Intervention Category Evaluation Type: New Patient Evaluation  Faith Patricelli 06/02/2017, 1:24 AM

## 2017-06-03 ENCOUNTER — Encounter: Payer: Self-pay | Admitting: Internal Medicine

## 2017-06-03 DIAGNOSIS — I350 Nonrheumatic aortic (valve) stenosis: Secondary | ICD-10-CM

## 2017-06-03 LAB — TROPONIN I: Troponin I: 8 ng/mL (ref <0.03)

## 2017-06-03 NOTE — Progress Notes (Addendum)
[facility] - [place] at [facility]   PATIENT NAME: Monica [name]    MR#:  [id number]  DATE OF BIRTH:  February 17, 1934  SUBJECTIVE:  CHIEF COMPLAINT:   Chief Complaint  Patient presents with  . Code STEMI   No complaint. REVIEW OF SYSTEMS:  Review of Systems  Constitutional: Negative for chills, fever and malaise/fatigue.  HENT: Negative for sore throat.   Eyes: Negative for blurred vision and double vision.  Respiratory: Negative for cough, hemoptysis, shortness of breath, wheezing and stridor.   Cardiovascular: Negative for chest pain, palpitations, orthopnea and leg swelling.  Gastrointestinal: Negative for abdominal pain, blood in stool, diarrhea, melena, nausea and vomiting.  Genitourinary: Negative for dysuria, flank pain and hematuria.  Musculoskeletal: Negative for back pain and joint pain.  Skin: Negative for rash.  Neurological: Negative for dizziness, sensory change, focal weakness, seizures, loss of consciousness, weakness and headaches.  Endo/Heme/Allergies: Negative for polydipsia.  Psychiatric/Behavioral: Negative for depression. The patient is not nervous/anxious.     DRUG ALLERGIES:  No Known Allergies VITALS:  Blood pressure 130/70, pulse 86, temperature 97.6 F (36.4 C), temperature source Axillary, resp. rate 17, height 5\' 3"  (1.6 m), weight 200 lb (90.7 kg), SpO2 98 %. PHYSICAL EXAMINATION:  Physical Exam  Constitutional: She is oriented to person, place, and time and well-developed, well-nourished, and in no distress.  HENT:  Head: Normocephalic.  Mouth/Throat: Oropharynx is clear and moist.  Eyes: Pupils are equal, round, and reactive to light. Conjunctivae and EOM are normal. No scleral icterus.  Neck: Normal range of motion. Neck supple. No JVD present. No tracheal deviation present.  Cardiovascular: Normal rate, regular rhythm and normal heart sounds.  Exam reveals no gallop.   No murmur heard. Pulmonary/Chest: Effort normal and  breath sounds normal. No respiratory distress. She has no wheezes. She has no rales.  Abdominal: Soft. Bowel sounds are normal. She exhibits no distension. There is no tenderness. There is no rebound.  Musculoskeletal: Normal range of motion. She exhibits no edema or tenderness.  Neurological: She is alert and oriented to person, place, and time. No cranial nerve deficit.  Skin: No rash noted. No erythema.  Psychiatric: Affect normal.   LABORATORY PANEL:  Female CBC  Recent Labs Lab 06/02/17 0615  WBC 9.8  HGB 13.8  HCT 41.1  PLT 216   ------------------------------------------------------------------------------------------------------------------ Chemistries   Recent Labs Lab 06/01/17 2228 06/02/17 0615  NA 138 136  K 3.8 4.3  CL 104 109  CO2 25 24  GLUCOSE 162* 120*  BUN 24* 22*  CREATININE 1.25* 1.03*  CALCIUM 9.6 8.8*  MG  --  2.0  AST 21  --   ALT 14  --   ALKPHOS 61  --   BILITOT 1.1  --    RADIOLOGY:  No results found. ASSESSMENT AND PLAN:    1. ST elevation MI: Status post revascularization; continue to IIb/IIIa platelet inhibitor as well as systemic anticoagulation. Manage blood pressure to decrease myocardial oxygen demand. Carvedilol per cardiology.  Moderate, noncritical aortic stenosis. Follow-up cardiologist as outpatient.  2. CAD: Status post drug-eluting stent to RCA. Continue aspirin. Nitroglycerin and morphine for pain as needed. 3. AKI: Prerenal; hydrated with intravenous fluid. Improved. 4. Hyperlipidemia: Elevated LDL; continue high potency statin therapy  Tobacco abuse. Smoking cessation was counseled for 4 minutes. The patient doesn't want a nicotine patch this time.  All the records are reviewed and case discussed with Care Management/Social Worker. Management plans discussed with the patient, her  granddaughter and they are in agreement.  CODE STATUS: Full Code  TOTAL TIME TAKING CARE OF THIS PATIENT: 28 minutes.   More than 50% of  the time was spent in counseling/coordination of care: YES  POSSIBLE D/C IN 1 DAYS, DEPENDING ON CLINICAL CONDITION.   Monica [name], Monica [name] M.D on 06/03/2017 at 3:07 PM  Between 7am to 6pm - Pager - [phone]  After 6pm go to [email or web address] - Therapist, nutritionalpassword [email or web address]  [facility] [place] [facility]

## 2017-06-03 NOTE — Care Management (Signed)
Patient to discharge on Brilinta. Patient verbally  confirmed pharmacy coverage with insurance.  Provided with coupon for 30 day trial  

## 2017-06-03 NOTE — Progress Notes (Addendum)
Report called to A. Lansana on 2A, pt will transport in wheelchair with her chart, belongings, and meds.  No supp O2 required. VSS,  A&O x 4, ambulatory.

## 2017-06-03 NOTE — Progress Notes (Signed)
CH made a follow up visit. Pt was in good spirits and is looking forward to discharge tomorrow. Granddaughter is bedside. Pt stated that she is "good medicine" for her. CH will follow up as needed.    06/03/17 1100  Clinical Encounter Type  Visited With Patient;Patient and family together  Visit Type Follow-up;Spiritual support;Critical Care  Consult/Referral To Chaplain

## 2017-06-03 NOTE — Progress Notes (Signed)
No further chest pain No distress No new complaints I have placed orders for transfer to MedSurg with cardiac monitoring Counseled regarding smoking cessation After transfer, PCCM will sign off. Please call if we can be of further assistance  Billy Fischeravid Janeth Terry, MD PCCM service Mobile [phone] Pager [phone] 06/03/2017 1:52 PM

## 2017-06-03 NOTE — Progress Notes (Addendum)
Dr Sung AmabileSimonds informed of a new bruise/hematoma on patient's RLE, is sore to the touch.  Patient denies she struck her leg on anything in the room.  Writer did not observe this bruise this AM. Monitor for now, no new orders

## 2017-06-03 NOTE — Progress Notes (Signed)
Patient received from ICU.Has cath anf PCI 06/02/17.Patient is now stable,denies pain.

## 2017-06-03 NOTE — Progress Notes (Signed)
Increased swelling and bruising  of right lower extremity noted and patient said it is painful,MD notified.

## 2017-06-03 NOTE — Progress Notes (Signed)
Progress Note  Patient Name: Monica [name] Date of Encounter: 06/03/2017  Primary Cardiologist: New to [facility] - consult by Arida  Subjective   No further chest pain. No SOB. Echo showed preserved EF of 55-60% with mild basal-midinferior hypokinesis. Troponin 13 on 10/22, pending this morning. Vitals stable.   Inpatient Medications    Scheduled Meds: . aspirin  324 mg Oral Once  . aspirin  81 mg Oral Daily  . atorvastatin  80 mg Oral q1800  . carvedilol  3.125 mg Oral BID WC  . sodium chloride flush  3 mL Intravenous Q12H  . ticagrelor  90 mg Oral BID   Continuous Infusions: . sodium chloride    . sodium chloride     PRN Meds: sodium chloride, acetaminophen, alum & mag hydroxide-simeth, morphine injection, nitroGLYCERIN, ondansetron (ZOFRAN) IV, sodium chloride flush   Vital Signs    Vitals:   06/03/17 0000 06/03/17 0200 06/03/17 0400 06/03/17 0600  BP: 120/67 94/64 (!) 89/61 114/75  Pulse: 81 81 83 80  Resp: 19 19 18 17   Temp: 98.1 F (36.7 C)     TempSrc: Oral     SpO2: 93% 91% 93% 94%  Weight:      Height:        Intake/Output Summary (Last 24 hours) at 06/03/17 0708 Last data filed at 06/03/17 0441  Gross per 24 hour  Intake              865 ml  Output             1135 ml  Net             -270 ml   Filed Weights   06/01/17 2235  Weight: 200 lb (90.7 kg)    Telemetry    NSR - Personally Reviewed  ECG    n/a - Personally Reviewed  Physical Exam   GEN: No acute distress.   Neck: No JVD. Cardiac: RRR, no murmurs, rubs, or gallops.  Respiratory: Clear to auscultation bilaterally. Right radial and femoral cath sites without bleeding, bruising, swelling, or erythema. Radial pulse 2+. No femoral bruit.  GI: Soft, nontender, non-distended.   MS: No edema; No deformity. Neuro:  Alert and oriented x 3; Nonfocal.  Psych: Normal affect.  Labs    Chemistry Recent Labs Lab 06/01/17 2228 06/02/17 0615  NA 138 136  K 3.8 4.3  CL 104 109  CO2  25 24  GLUCOSE 162* 120*  BUN 24* 22*  CREATININE 1.25* 1.03*  CALCIUM 9.6 8.8*  PROT 8.0  --   ALBUMIN 4.2  --   AST 21  --   ALT 14  --   ALKPHOS 61  --   BILITOT 1.1  --   GFRNONAA 39* 50*  GFRAA 45* 57*  ANIONGAP 9 3*     Hematology Recent Labs Lab 06/01/17 2228 06/02/17 0615  WBC 10.7 9.8  RBC 5.17 4.55  HGB 15.5 13.8  HCT 46.6 41.1  MCV 90.2 90.4  MCH 29.9 30.3  MCHC 33.2 33.5  RDW 13.7 13.5  PLT 246 216    Cardiac Enzymes Recent Labs Lab 06/01/17 2228 06/02/17 0615  TROPONINI 0.03* 13.00*   BNPNo results for input(s): BNP, PROBNP in the last 168 hours.   DDimer No results for input(s): DDIMER in the last 168 hours.   Radiology    Dg Abd 1 View  Addendum Date: 06/02/2017   ADDENDUM REPORT: 06/02/2017 07:23 ADDENDUM: Aortic Atherosclerosis (ICD10-I70.0). Aortic aneurysm  NOS (ICD10-I71.9). Electronically Signed   By: Bretta Bang III M.D.   On: 06/02/2017 07:23   Result Date: 06/02/2017 IMPRESSION: 1. Evidence of abdominal aortic aneurysm. This finding warrants ultrasound or CT to further characterize. 2. Stomach mildly distended with air. No bowel obstruction or free air evident. 3.  Contrast noted in urinary bladder. Electronically Signed: By: Bretta Bang III M.D. On: 06/02/2017 07:17   Dg Chest Port 1 View  Result Date: 06/02/2017 IMPRESSION: Slight cardiomegaly with pulmonary venous hypertension. There is interstitial edema, slightly more on the left than the right. No airspace consolidation. No evident adenopathy. Electronically Signed   By: Bretta Bang III M.D.   On: 06/02/2017 07:18    Cardiac Studies   LHC 06/01/2017: Coronary Findings   Dominance: Right  Left Main  Vessel is angiographically normal.  LM lesion, 20% stenosed.  Left Anterior Descending  There is mild the vessel.  First Diagonal Branch  Vessel is large in size.  Ost 1st Diag to 1st Diag lesion, 20% stenosed.  Third Diagonal Branch  Vessel is small  in size. There is mild disease in the vessel.  Left Circumflex  Ost Cx to Prox Cx lesion, 50% stenosed.  First Obtuse Marginal Branch  There is mild disease in the vessel.  Second Obtuse Marginal Branch  The vessel exhibits minimal luminal irregularities.  Right Coronary Artery  Prox RCA to Mid RCA lesion, 100% stenosed. The lesion is type C and thrombotic. The lesion is moderately calcified.  Angioplasty: Lesion crossed with guidewire. Pre-stent angioplasty was performed. A drug eluting stent was successfully placed. Post-stent angioplasty was performed. There is no pre-interventional antegrade distal flow (TIMI 0). The post-interventional distal flow is normal (TIMI 3). The intervention was successful . No complications occurred at this lesion.  There is no residual stenosis post intervention.  Mid RCA lesion, 50% stenosed.  Dist RCA lesion, 30% stenosed.  Coronary Diagrams   Diagnostic Diagram       Post-Intervention Diagram        1. Severe one-vessel coronary artery disease with thrombotic occlusion of the proximal and mid right coronary artery. Moderate ostial left circumflex stenosis and mild LAD disease. 2. At least moderate aortic stenosis with a peak to peak gradient of 22 mmHg. Normal left ventricular end-diastolic pressure. 3. Successful angioplasty and drug-eluting stent placement to the proximal right coronary artery extending into the midsegment. 4. Severe hypotension and bradycardia after opening the right coronary artery likely due to neurohormonal reflects and required treatment with atropine and norepinephrine drip which was discontinued at the end of the case.  Recommendations: Dual antiplatelet therapy for at least one year. Aggressive treatment of risk factors and smoking cessation. Obtain an echocardiogram to evaluate aortic stenosis and LV systolic function. Avoid catheterization via the right radial artery in the future due to right radial  loop.   TTE 06/02/2017:  Study Conclusions  - Left ventricle: The cavity size was normal. There was mild   concentric hypertrophy. Systolic function was normal. The   estimated ejection fraction was in the range of 55% to 60%. Mild   hypokinesis of the basal-midinferior myocardium. Doppler   parameters are consistent with abnormal left ventricular   relaxation (grade 1 diastolic dysfunction). - Aortic valve: There was moderate to severe stenosis. Mean   gradient (S): 21 mm Hg. Valve area (VTI): 0.99 cm^2. - Mitral valve: Calcified annulus. There was mild regurgitation. - Left atrium: The atrium was mildly dilated.  Patient Profile  83 y.o. female with history of tobacco abuse who [facility] with inferior ST elevation MI.   Assessment & Plan    1. Inferior ST elevation MI: -Currently, without chest pain -Troponin peaked at 13, pending this morning -Continue DAPT with ASA 81 mg daily and Brilinta 90 mg bid -Echo showed normal EF of 55-60% and mild HK of the basal-inferior myocardium -Coreg -Initially required atropine and Levophed post procedure due to bradycardia and hypotension s/p PCI felt to be 2/2 neurohormonal reflex. Now stable and off supportive care -Keep in ICU for the better part of 10/22 with possible transfer to 2A this PM vs 10/23 -Cardiac rehab  2. Aortic stenosis: -Echo showed moderate to severe aortic stenosis with a mean gradient of 21 mmHg and a valve area of 0.99 cm^2 -Outpatient follow up  3. HLD: -Goal LDL < 70 -Lipitor -Recheck lipid and liver function in 8 weeks as an outpatient  3. Tobacco abuse: -Cessation advised   4. Hyperglycemia: -A1c 5.7% -Outpatient follow up  5. Dispo: -Transfer to 2A today -Ambulate -Discharge 10/24 if no events   For questions or updates, please contact [facility] Please consult [email or web address] for contact info under Cardiology/STEMI.    Signed, Eula Listenyan Dunn, PA-C [facility] Pager: [phone] 06/03/2017, 7:08 AM

## 2017-06-04 ENCOUNTER — Telehealth: Payer: Self-pay | Admitting: Cardiovascular Disease

## 2017-06-04 ENCOUNTER — Inpatient Hospital Stay: Payer: Medicare Other

## 2017-06-04 ENCOUNTER — Inpatient Hospital Stay
Admission: EM | Admit: 2017-06-04 | Discharge: 2017-06-07 | DRG: 378 | Disposition: A | Discharge status: Home Health | Payer: Medicare Other | Attending: Internal Medicine | Admitting: Internal Medicine

## 2017-06-04 DIAGNOSIS — Z955 Presence of coronary angioplasty implant and graft: Secondary | ICD-10-CM

## 2017-06-04 DIAGNOSIS — I2510 Atherosclerotic heart disease of native coronary artery without angina pectoris: Secondary | ICD-10-CM | POA: Diagnosis present

## 2017-06-04 DIAGNOSIS — E785 Hyperlipidemia, unspecified: Secondary | ICD-10-CM | POA: Diagnosis present

## 2017-06-04 DIAGNOSIS — R1030 Lower abdominal pain, unspecified: Secondary | ICD-10-CM

## 2017-06-04 DIAGNOSIS — K921 Melena: Secondary | ICD-10-CM | POA: Diagnosis present

## 2017-06-04 DIAGNOSIS — Z79899 Other long term (current) drug therapy: Secondary | ICD-10-CM

## 2017-06-04 DIAGNOSIS — M79604 Pain in right leg: Secondary | ICD-10-CM

## 2017-06-04 DIAGNOSIS — M7989 Other specified soft tissue disorders: Secondary | ICD-10-CM

## 2017-06-04 DIAGNOSIS — K920 Hematemesis: Secondary | ICD-10-CM | POA: Diagnosis not present

## 2017-06-04 DIAGNOSIS — E8770 Fluid overload, unspecified: Secondary | ICD-10-CM | POA: Diagnosis present

## 2017-06-04 DIAGNOSIS — D72829 Elevated white blood cell count, unspecified: Secondary | ICD-10-CM

## 2017-06-04 DIAGNOSIS — R197 Diarrhea, unspecified: Secondary | ICD-10-CM

## 2017-06-04 DIAGNOSIS — I350 Nonrheumatic aortic (valve) stenosis: Secondary | ICD-10-CM | POA: Diagnosis present

## 2017-06-04 DIAGNOSIS — E669 Obesity, unspecified: Secondary | ICD-10-CM | POA: Diagnosis present

## 2017-06-04 DIAGNOSIS — I252 Old myocardial infarction: Secondary | ICD-10-CM

## 2017-06-04 DIAGNOSIS — R112 Nausea with vomiting, unspecified: Secondary | ICD-10-CM | POA: Diagnosis not present

## 2017-06-04 DIAGNOSIS — E860 Dehydration: Secondary | ICD-10-CM

## 2017-06-04 DIAGNOSIS — Z6841 Body Mass Index (BMI) 40.0 and over, adult: Secondary | ICD-10-CM

## 2017-06-04 DIAGNOSIS — K922 Gastrointestinal hemorrhage, unspecified: Secondary | ICD-10-CM | POA: Diagnosis present

## 2017-06-04 DIAGNOSIS — I10 Essential (primary) hypertension: Secondary | ICD-10-CM | POA: Diagnosis present

## 2017-06-04 DIAGNOSIS — D62 Acute posthemorrhagic anemia: Secondary | ICD-10-CM | POA: Diagnosis present

## 2017-06-04 DIAGNOSIS — Z7982 Long term (current) use of aspirin: Secondary | ICD-10-CM

## 2017-06-04 HISTORY — DX: Hyperlipidemia, unspecified: E78.5

## 2017-06-04 HISTORY — DX: Nonrheumatic aortic (valve) stenosis: I35.0

## 2017-06-04 HISTORY — DX: Obesity, unspecified: E66.9

## 2017-06-04 HISTORY — DX: Gastrointestinal hemorrhage, unspecified: K92.2

## 2017-06-04 LAB — GLUCOSE, CAPILLARY: Glucose-Capillary: 134 mg/dL — ABNORMAL HIGH (ref 65–99)

## 2017-06-04 MED ORDER — ASPIRIN 81 MG PO CHEW: 81.0000 mg | CHEWABLE_TABLET | Freq: Every day | ORAL | 2 refills | Status: DC

## 2017-06-04 MED ORDER — CARVEDILOL 3.125 MG PO TABS: 3.1250 mg | ORAL_TABLET | Freq: Two times a day (BID) | ORAL | 2 refills | Status: DC

## 2017-06-04 MED ORDER — NITROGLYCERIN 0.4 MG SL SUBL: 0.4000 mg | SUBLINGUAL_TABLET | SUBLINGUAL | 2 refills | Status: DC | PRN

## 2017-06-04 MED ORDER — TICAGRELOR 90 MG PO TABS: 90.0000 mg | ORAL_TABLET | Freq: Two times a day (BID) | ORAL | 2 refills | Status: DC

## 2017-06-04 MED ORDER — ATORVASTATIN CALCIUM 80 MG PO TABS: 80.0000 mg | ORAL_TABLET | Freq: Every day | ORAL | 2 refills | Status: DC

## 2017-06-04 NOTE — Progress Notes (Addendum)
Patient referred to Cardiac Rehab with dx of STEMI / S/P Coronary Stent (DES) to right proximal to mid RCA.  EF on echo was 55 to 60%.   Patient with HLD and is a current smoker.  Reviewed with patient the anatomy of the heart and the location of the blocked artery causing her heart attack.  Explained to patient she has CAD and what that means.  "Bouncing Back After Heart Attack"  Booklet given and reviewed with patient.    Risk Factors of Heart Disease reviewed with patient.  Explained the importance of controlling BP, cholesterol, and blood sugar; maintaining a healthy weight; smoking cessation, and exercise.  Smoking cessation discussed with patient.  Patient informed this RN that she had been wanting to quit smoking and had been tapering down.  Patient reports smoking 1/2 pack per day.  Information on the effects of smoking on the body, tips for quitting, relaxation apps, smoking cessation apps, and Quit Smart classes was provided to patient.    Discussed the importance of following a low sodium, low fat, low cholesterol heart healthy diet.  Patient reported the Dietitian had already been in to see her and provided her with information on a heart healthy diet.  Patient stated this information was very helpful.   Exercise discussed.  I explained to patient that Dr. Kirke CorinArida had referred to Cardiac Rehab. An overview of the Cardiac Rehab program was provided to patient.   Barriers to exercise for patient:  Patient has plates / screws in her right ankle / foot.  Patient stated this limits her mobility.  In addition, she has pain in her ankle when ambulating.  Patient stated she would like to talk with Dr. Kirke CorinArida and think about Cardiac Rehab.  Brochure for Cardiac Rehab provided to patient.     Reviewed cardiac medications, rationale for taking, and side effects.  Informed patient the nurse will provide her with the stent card and instructed her to keep with her at all times.    Provided patient with  information on [facility].  Invited / Encourage patient to attend.    Patient for discharge to home today.    Patient thanked me for providing the above information to her.    Army Meliaiane Raina Sole, RN, BSN, [facility] Pulmonary Nurse Navigator

## 2017-06-04 NOTE — Discharge Summary (Addendum)
[facility] - [place] at [facility]   PATIENT NAME: Monica [name]    MR#:  [id number]  DATE OF BIRTH:  11-22-1933  DATE OF ADMISSION:  06/01/2017   ADMITTING PHYSICIAN: Arnaldo NatalMichael S Diamond, MD  DATE OF DISCHARGE: 06/04/2017 PRIMARY CARE PHYSICIAN: Patient, No Pcp Per   ADMISSION DIAGNOSIS:  Code stemi STEMI DISCHARGE DIAGNOSIS:  Active Problems:   Acute ST elevation myocardial infarction (STEMI) of inferior wall (HCC)  SECONDARY DIAGNOSIS:   Past Medical History:  Diagnosis Date  . CAD (coronary artery disease) 2018   HOSPITAL COURSE:   1. ST elevation MI: Status post revascularization; continue to IIb/IIIa platelet inhibitor as well as systemic anticoagulation. Manage blood pressure to decrease myocardial oxygen demand.Carvedilol per cardiology.  Moderate, noncritical aortic stenosis. Follow-up cardiologist as outpatient.  2. CAD: Status post drug-eluting stent to RCA. Continue aspirin. Nitroglycerin and morphine for pain as needed. 3. AKI: Prerenal; hydrated with intravenous fluid. Improved. 4. Hyperlipidemia: Elevated LDL; continue high potency statin therapy  Tobacco abuse. Smoking cessation was counseled for 4 minutes. The patient doesn't want a nicotine patch this time.  Right leg bruises and the swelling. No DVT per venous duplex. I discussed with cardiology PA, Mr. Shea EvansDunn. DISCHARGE CONDITIONS:  Stable, [facility]. CONSULTS OBTAINED:  Treatment Team:  Iran OuchArida, Muhammad A, MD DRUG ALLERGIES:  No Known Allergies DISCHARGE MEDICATIONS:   Allergies as of 06/04/2017   No Known Allergies     Medication List    TAKE these medications   aspirin 81 MG chewable tablet Chew 1 tablet (81 mg total) by mouth daily.   atorvastatin 80 MG tablet Commonly known as:  LIPITOR Take 1 tablet (80 mg total) by mouth daily at 6 PM.   carvedilol 3.125 MG tablet Commonly known as:  COREG Take 1 tablet (3.125 mg total)  by mouth 2 (two) times daily with a meal.   nitroGLYCERIN 0.4 MG SL tablet Commonly known as:  NITROSTAT Place 1 tablet (0.4 mg total) under the tongue every 5 (five) minutes as needed for chest pain.   ticagrelor 90 MG Tabs tablet Commonly known as:  BRILINTA Take 1 tablet (90 mg total) by mouth 2 (two) times daily.        DISCHARGE INSTRUCTIONS:  See AVS.   If you experience worsening of your admission symptoms, develop shortness of breath, life threatening emergency, suicidal or homicidal thoughts you must seek medical attention immediately by calling 911 or calling your MD immediately  if symptoms less severe.  You Must read complete instructions/literature along with all the possible adverse reactions/side effects for all the Medicines you take and that have been prescribed to you. Take any new Medicines after you have completely understood and accpet all the possible adverse reactions/side effects.   Please note  You were cared for by a hospitalist during your hospital stay. If you have any questions about your discharge medications or the care you received while you were in the hospital after you are discharged, you can call the unit and asked to speak with the hospitalist on call if the hospitalist that took care of you is not available. Once you are discharged, your primary care physician will handle any further medical issues. Please note that NO REFILLS for any discharge medications will be authorized once you are discharged, as it is imperative that you return to your primary care physician (or establish a relationship with a primary care physician if you do  not have one) for your aftercare needs so that they can reassess your need for medications and monitor your lab values.    On the day of Discharge:  VITAL SIGNS:  Blood pressure 107/73, pulse 89, temperature 98.2 F (36.8 C), temperature source Oral, resp. rate (!) 24, height 5\' 3"  (1.6 m), weight 200 lb (90.7 kg), SpO2  98 %. PHYSICAL EXAMINATION:  GENERAL:  83 y.o.-year-old patient lying in the bed with no acute distress.  EYES: Pupils equal, round, reactive to light and accommodation. No scleral icterus. Extraocular muscles intact.  HEENT: Head atraumatic, normocephalic. Oropharynx and nasopharynx clear.  NECK:  Supple, no jugular venous distention. No thyroid enlargement, no tenderness.  LUNGS: Normal breath sounds bilaterally, no wheezing, rales,rhonchi or crepitation. No use of accessory muscles of respiration.  CARDIOVASCULAR: S1, S2 normal. No murmurs, rubs, or gallops.  ABDOMEN: Soft, non-tender, non-distended. Bowel sounds present. No organomegaly or mass.  EXTREMITIES: No pedal edema, cyanosis, or clubbing. No calf tenderness. Lower part of shin has bruise and swelling. NEUROLOGIC: Cranial nerves II through XII are intact. Muscle strength 5/5 in all extremities. Sensation intact. Gait not checked.  PSYCHIATRIC: The patient is alert and oriented x 3.  SKIN: No obvious rash, lesion, or ulcer.  DATA REVIEW:   CBC  Recent Labs Lab 06/02/17 0615  WBC 9.8  HGB 13.8  HCT 41.1  PLT 216    Chemistries   Recent Labs Lab 06/01/17 2228 06/02/17 0615  NA 138 136  K 3.8 4.3  CL 104 109  CO2 25 24  GLUCOSE 162* 120*  BUN 24* 22*  CREATININE 1.25* 1.03*  CALCIUM 9.6 8.8*  MG  --  2.0  AST 21  --   ALT 14  --   ALKPHOS 61  --   BILITOT 1.1  --      Microbiology Results  Results for orders placed or performed during the hospital encounter of 06/01/17  MRSA PCR Screening     Status: None   Collection Time: 06/02/17  1:24 AM  Result Value Ref Range Status   MRSA by PCR NEGATIVE NEGATIVE Final    Comment:        The GeneXpert MRSA Assay (FDA approved for NASAL specimens only), is one component of a comprehensive MRSA colonization surveillance program. It is not intended to diagnose MRSA infection nor to guide or monitor treatment for MRSA infections.     RADIOLOGY:  No  results found.   Management plans discussed with the patient, family and they are in agreement.  CODE STATUS: Full Code   TOTAL TIME TAKING CARE OF THIS PATIENT: 35 minutes.    Shaune Pollack M.D on 06/04/2017 at 10:25 AM  Between 7am to 6pm - Pager - [phone]  After 6pm go to [email or web address] - Social research officer, government  [facility] [place] [facility]  Office  [phone]  CC: Primary care physician; Patient, No Pcp Per   Note: This dictation was prepared with Dragon dictation along with smaller phrase technology. Any transcriptional errors that result from this process are unintentional.

## 2017-06-04 NOTE — Progress Notes (Signed)
Patient complained of nausea to the desk.  Found her in the bathroom, SOB and diaphoretic.  Got her back to bed. BP 89/50 sitting at side of bed.  RR: 32.  PO2 98%.  Got her laying flat.  BP 103/69.  10 minutes later BP 107/73.  Dr. Mariah MillingGollan here for rounds.  Thinks she had a vasal vagal episode.  Will monitor closely.

## 2017-06-04 NOTE — Plan of Care (Signed)
Problem: Health Behavior/Discharge Planning: Goal: Ability to manage health-related needs will improve Outcome: Adequate for Discharge Will need home health supervision and encouragement to continue her meds at home.

## 2017-06-04 NOTE — Progress Notes (Signed)
Progress Note  Patient Name: Monica [name] Date of Encounter: 06/04/2017  Primary Cardiologist: New to [facility] - consult by Arida  Subjective   No further chest pain. No SOB. Echo showed preserved EF of 55-60% with mild basal-midinferior hypokinesis. Troponin peaked at 13. BP stable. Mildly tachycardic this morning in the 110s to 120s bpm.   Inpatient Medications    Scheduled Meds: . aspirin  81 mg Oral Daily  . atorvastatin  80 mg Oral q1800  . carvedilol  3.125 mg Oral BID WC  . sodium chloride flush  3 mL Intravenous Q12H  . ticagrelor  90 mg Oral BID   Continuous Infusions: . sodium chloride     PRN Meds: sodium chloride, acetaminophen, alum & mag hydroxide-simeth, nitroGLYCERIN, ondansetron (ZOFRAN) IV, sodium chloride flush   Vital Signs    Vitals:   06/03/17 1600 06/03/17 1658 06/03/17 1922 06/04/17 0502  BP: 124/84 (!) 110/56 130/61 110/86  Pulse: 90 91 92 (!) 101  Resp: 17 18    Temp: (!) 97.4 F (36.3 C)  98.6 F (37 C) 98.2 F (36.8 C)  TempSrc: Oral  Oral Oral  SpO2: 96% 94% 92% 97%  Weight:      Height:        Intake/Output Summary (Last 24 hours) at 06/04/17 0720 Last data filed at 06/04/17 0600  Gross per 24 hour  Intake              834 ml  Output             1302 ml  Net             -468 ml   Filed Weights   06/01/17 2235  Weight: 200 lb (90.7 kg)    Telemetry    Sinus tachycardia, 110s to 120s bpm, rare PVC - Personally Reviewed  ECG    n/a - Personally Reviewed  Physical Exam   GEN: No acute distress.   Neck: No JVD. Cardiac: RRR, no murmurs, rubs, or gallops.  Respiratory: Clear to auscultation bilaterally. Right radial and femoral cath sites without bleeding, bruising, swelling, or erythema. Radial pulse 2+. No femoral bruit.  GI: Soft, nontender, non-distended.   MS: No edema; No deformity. Neuro:  Alert and oriented x 3; Nonfocal.  Psych: Normal affect.  Labs    Chemistry  Recent Labs Lab 06/01/17 2228  06/02/17 0615  NA 138 136  K 3.8 4.3  CL 104 109  CO2 25 24  GLUCOSE 162* 120*  BUN 24* 22*  CREATININE 1.25* 1.03*  CALCIUM 9.6 8.8*  PROT 8.0  --   ALBUMIN 4.2  --   AST 21  --   ALT 14  --   ALKPHOS 61  --   BILITOT 1.1  --   GFRNONAA 39* 50*  GFRAA 45* 57*  ANIONGAP 9 3*     Hematology  Recent Labs Lab 06/01/17 2228 06/02/17 0615  WBC 10.7 9.8  RBC 5.17 4.55  HGB 15.5 13.8  HCT 46.6 41.1  MCV 90.2 90.4  MCH 29.9 30.3  MCHC 33.2 33.5  RDW 13.7 13.5  PLT 246 216    Cardiac Enzymes  Recent Labs Lab 06/01/17 2228 06/02/17 0615 06/03/17 0837  TROPONINI 0.03* 13.00* 8.00*   BNPNo results for input(s): BNP, PROBNP in the last 168 hours.   DDimer No results for input(s): DDIMER in the last 168 hours.   Radiology    Dg Abd 1 View  Addendum Date: 06/02/2017  ADDENDUM REPORT: 06/02/2017 07:23 ADDENDUM: Aortic Atherosclerosis (ICD10-I70.0). Aortic aneurysm NOS (ICD10-I71.9). Electronically Signed   By: Bretta Bang III M.D.   On: 06/02/2017 07:23   Result Date: 06/02/2017 IMPRESSION: 1. Evidence of abdominal aortic aneurysm. This finding warrants ultrasound or CT to further characterize. 2. Stomach mildly distended with air. No bowel obstruction or free air evident. 3.  Contrast noted in urinary bladder. Electronically Signed: By: Bretta Bang III M.D. On: 06/02/2017 07:17   Dg Chest Port 1 View  Result Date: 06/02/2017 IMPRESSION: Slight cardiomegaly with pulmonary venous hypertension. There is interstitial edema, slightly more on the left than the right. No airspace consolidation. No evident adenopathy. Electronically Signed   By: Bretta Bang III M.D.   On: 06/02/2017 07:18    Cardiac Studies   LHC 06/01/2017: Coronary Findings   Dominance: Right  Left Main  Vessel is angiographically normal.  LM lesion, 20% stenosed.  Left Anterior Descending  There is mild the vessel.  First Diagonal Branch  Vessel is large in size.  Ost  1st Diag to 1st Diag lesion, 20% stenosed.  Third Diagonal Branch  Vessel is small in size. There is mild disease in the vessel.  Left Circumflex  Ost Cx to Prox Cx lesion, 50% stenosed.  First Obtuse Marginal Branch  There is mild disease in the vessel.  Second Obtuse Marginal Branch  The vessel exhibits minimal luminal irregularities.  Right Coronary Artery  Prox RCA to Mid RCA lesion, 100% stenosed. The lesion is type C and thrombotic. The lesion is moderately calcified.  Angioplasty: Lesion crossed with guidewire. Pre-stent angioplasty was performed. A drug eluting stent was successfully placed. Post-stent angioplasty was performed. There is no pre-interventional antegrade distal flow (TIMI 0). The post-interventional distal flow is normal (TIMI 3). The intervention was successful . No complications occurred at this lesion.  There is no residual stenosis post intervention.  Mid RCA lesion, 50% stenosed.  Dist RCA lesion, 30% stenosed.  Coronary Diagrams   Diagnostic Diagram       Post-Intervention Diagram        1. Severe one-vessel coronary artery disease with thrombotic occlusion of the proximal and mid right coronary artery. Moderate ostial left circumflex stenosis and mild LAD disease. 2. At least moderate aortic stenosis with a peak to peak gradient of 22 mmHg. Normal left ventricular end-diastolic pressure. 3. Successful angioplasty and drug-eluting stent placement to the proximal right coronary artery extending into the midsegment. 4. Severe hypotension and bradycardia after opening the right coronary artery likely due to neurohormonal reflects and required treatment with atropine and norepinephrine drip which was discontinued at the end of the case.  Recommendations: Dual antiplatelet therapy for at least one year. Aggressive treatment of risk factors and smoking cessation. Obtain an echocardiogram to evaluate aortic stenosis and LV systolic function. Avoid  catheterization via the right radial artery in the future due to right radial loop.   TTE 06/02/2017:  Study Conclusions  - Left ventricle: The cavity size was normal. There was mild   concentric hypertrophy. Systolic function was normal. The   estimated ejection fraction was in the range of 55% to 60%. Mild   hypokinesis of the basal-midinferior myocardium. Doppler   parameters are consistent with abnormal left ventricular   relaxation (grade 1 diastolic dysfunction). - Aortic valve: There was moderate to severe stenosis. Mean   gradient (S): 21 mm Hg. Valve area (VTI): 0.99 cm^2. - Mitral valve: Calcified annulus. There was mild regurgitation. - Left atrium:  The atrium was mildly dilated.  Patient Profile     83 y.o. female with history of tobacco abuse who [facility] with inferior ST elevation MI.   Assessment & Plan    1. Inferior ST elevation MI: -Currently, without chest pain -Troponin peaked at 13 -Continue DAPT with ASA 81 mg daily and Brilinta 90 mg bid (has Brilinta card) -Echo showed normal EF of 55-60% and mild HK of the basal-inferior myocardium -Mildly tachycardic this morning with ambulation, could increase Coreg to 6.25 mg bid -Initially required atropine and Levophed post procedure due to bradycardia and hypotension s/p PCI felt to be 2/2 neurohormonal reflex. Now stable and off supportive care -Cardiac rehab  2. Aortic stenosis: -Echo showed moderate aortic stenosis with a mean gradient of 21 mmHg and a valve area of 0.99 cm^2 -Outpatient follow up  3. HLD: -Goal LDL < 70 -Lipitor -Recheck lipid and liver function in 8 weeks as an outpatient  3. Tobacco abuse: -Cessation advised   4. Hyperglycemia: -A1c 5.7% -Outpatient follow up  5. Dispo: -Will need follow up in 1 week   For questions or updates, please contact [facility] Please consult [email or web address] for contact info under Cardiology/STEMI.    Signed, Eula Listenyan Dunn,  PA-C [facility] Pager: [phone] 06/04/2017, 7:20 AM  Attending Note Patient seen and examined, agree with detailed note above,  Patient presentation and plan discussed on rounds.   Was doing well this morning until she try to have a bowel movement Initially had abdominal discomfort Then felt diaphoretic, hot/sweaty, dizzy Went back to bed, blood pressure 80 systolic slowly improved up to 100 systolic Continue to have some abdominal discomfort, reported having bowel movement yesterday but not sure when Felt like she has to go again  On physical exam cool, clammy, lungs clear to auscultation, heart sounds regular with 2/6 systolic ejection murmur heard right sternal border, abdomen soft nontender, no significant lower extremity edema  Lab work reviewed showing troponin of 8 down from 13  A/P: STEMI Stent placed to her RCA Catheterization results discussed with her Echocardiogram results discussed with her Stressed importance of staying on her aspirin, Brilinta, carvedilol  2) hypertension, diaphoresis Likely vasovagal event in the setting of abdominal discomfort, possibly constipation Blood pressure slowly improving in the supine position She feels that she has to have another bowel movement Recommended nurses we give IV fluids if low blood pressure persists  3) aortic valve stenosis Peak velocity in the mild to moderate range, mean gradient in the mild range This can be managed as an outpatient with periodic echocardiogram  Long discussion with patient and nursing concerning vasovagal event this morning Greater than 50% was spent in counseling and coordination of care with patient Total encounter time 35 minutes or more   Signed: Dossie Arbourim Petrina Melby  M.D., Ph.D. [facility]

## 2017-06-04 NOTE — Telephone Encounter (Signed)
Patient currently admitted at this time. 

## 2017-06-04 NOTE — Care Management (Signed)
An appointment has been made to get patient established [facility].  Patient now tells CM she does not think she has pharmacy coverage because "I have not been to a doctor in 30 years."  Discussed that now is open enrollment and needs to sign up for drug plan regardless if has to pay a penalty.  Provided patient with coupons from [employer]  for [employer] and four dollar list.  She says she will be able to pay for the nitrostat, coreg and atorvastatin.   She confirms that she has the Brilinta 30 day coupon.  Provided patient with application for [facility] and Brilinta Patient Assistance application.  Due to concerns over patient's ability to manage her new medial regime, discussed home health nurse and social worker- to assist patient with sign up for medicare D and [facility] application.  She is in agreement.  No agency preference. [facility]. Confirmed patient's demographic information

## 2017-06-04 NOTE — Discharge Instructions (Signed)
Heart healthy diet. °Smoking cessation. °

## 2017-06-04 NOTE — Progress Notes (Signed)
Discharged to home with husband and son.  Stressed importance of Coreg and 81 mg of aspirin daily.  She is concerned that she I having side effects,  "she feels weird".  Follow up with home RN and SW.

## 2017-06-04 NOTE — Care Management Important Message (Signed)
Important Message  Patient Details  Name: Monica [name] MRN: [id number] Date of Birth: [birth year]   Medicare Important Message Given:  Yes Signed IM notice given    Eber HongGreene, Madyson Lukach R, RN 06/04/2017, 9:50 AM

## 2017-06-04 NOTE — Telephone Encounter (Signed)
TCM... Pt is being discharged today They saw Alycia RossettiRyan & Dr Kirke CorinArida in hospital  It was for a stemi  They are scheduled for 06/23/17 to see Dr Kirke CorinArida

## 2017-06-05 ENCOUNTER — Encounter: Payer: Self-pay | Admitting: Emergency Medicine

## 2017-06-05 ENCOUNTER — Emergency Department: Payer: Medicare Other

## 2017-06-05 DIAGNOSIS — E669 Obesity, unspecified: Secondary | ICD-10-CM | POA: Diagnosis present

## 2017-06-05 DIAGNOSIS — E785 Hyperlipidemia, unspecified: Secondary | ICD-10-CM | POA: Diagnosis present

## 2017-06-05 DIAGNOSIS — I2510 Atherosclerotic heart disease of native coronary artery without angina pectoris: Secondary | ICD-10-CM | POA: Diagnosis present

## 2017-06-05 DIAGNOSIS — K920 Hematemesis: Secondary | ICD-10-CM | POA: Diagnosis present

## 2017-06-05 DIAGNOSIS — R197 Diarrhea, unspecified: Secondary | ICD-10-CM

## 2017-06-05 DIAGNOSIS — K922 Gastrointestinal hemorrhage, unspecified: Secondary | ICD-10-CM | POA: Diagnosis present

## 2017-06-05 DIAGNOSIS — I350 Nonrheumatic aortic (valve) stenosis: Secondary | ICD-10-CM | POA: Diagnosis present

## 2017-06-05 DIAGNOSIS — I10 Essential (primary) hypertension: Secondary | ICD-10-CM | POA: Diagnosis present

## 2017-06-05 DIAGNOSIS — R112 Nausea with vomiting, unspecified: Secondary | ICD-10-CM

## 2017-06-05 DIAGNOSIS — Z955 Presence of coronary angioplasty implant and graft: Secondary | ICD-10-CM | POA: Diagnosis not present

## 2017-06-05 DIAGNOSIS — D62 Acute posthemorrhagic anemia: Secondary | ICD-10-CM | POA: Diagnosis present

## 2017-06-05 DIAGNOSIS — I25118 Atherosclerotic heart disease of native coronary artery with other forms of angina pectoris: Secondary | ICD-10-CM | POA: Diagnosis not present

## 2017-06-05 DIAGNOSIS — Z7982 Long term (current) use of aspirin: Secondary | ICD-10-CM | POA: Diagnosis not present

## 2017-06-05 DIAGNOSIS — K921 Melena: Secondary | ICD-10-CM | POA: Diagnosis present

## 2017-06-05 DIAGNOSIS — E860 Dehydration: Secondary | ICD-10-CM | POA: Diagnosis present

## 2017-06-05 DIAGNOSIS — E8770 Fluid overload, unspecified: Secondary | ICD-10-CM | POA: Diagnosis present

## 2017-06-05 DIAGNOSIS — I252 Old myocardial infarction: Secondary | ICD-10-CM | POA: Diagnosis not present

## 2017-06-05 DIAGNOSIS — I2119 ST elevation (STEMI) myocardial infarction involving other coronary artery of inferior wall: Secondary | ICD-10-CM | POA: Diagnosis not present

## 2017-06-05 DIAGNOSIS — Z6841 Body Mass Index (BMI) 40.0 and over, adult: Secondary | ICD-10-CM | POA: Diagnosis not present

## 2017-06-05 DIAGNOSIS — Z79899 Other long term (current) drug therapy: Secondary | ICD-10-CM | POA: Diagnosis not present

## 2017-06-05 LAB — LACTIC ACID, PLASMA
Lactic Acid, Venous: 2 mmol/L (ref 0.5–1.9)
Lactic Acid, Venous: 1.3 mmol/L (ref 0.5–1.9)

## 2017-06-05 LAB — CBC
HCT: 29.3 % — ABNORMAL LOW (ref 35.0–47.0)
Hemoglobin: 9.9 g/dL — ABNORMAL LOW (ref 12.0–16.0)
MCH: 30.9 pg (ref 26.0–34.0)
MCHC: 34 g/dL (ref 32.0–36.0)
MCV: 91 fL (ref 80.0–100.0)
Platelets: 198 10*3/uL (ref 150–440)
RBC: 3.22 MIL/uL — ABNORMAL LOW (ref 3.80–5.20)
RDW: 13.7 % (ref 11.5–14.5)
WBC: 17.3 10*3/uL — ABNORMAL HIGH (ref 3.6–11.0)

## 2017-06-05 LAB — COMPREHENSIVE METABOLIC PANEL
ALT: 14 U/L (ref 14–54)
AST: 26 U/L (ref 15–41)
Albumin: 3.4 g/dL — ABNORMAL LOW (ref 3.5–5.0)
Alkaline Phosphatase: 41 U/L (ref 38–126)
Anion gap: 9 (ref 5–15)
BUN: 86 mg/dL — ABNORMAL HIGH (ref 6–20)
CO2: 18 mmol/L — ABNORMAL LOW (ref 22–32)
Calcium: 8.9 mg/dL (ref 8.9–10.3)
Chloride: 110 mmol/L (ref 101–111)
Creatinine, Ser: 1.26 mg/dL — ABNORMAL HIGH (ref 0.44–1.00)
GFR calc Af Amer: 45 mL/min — ABNORMAL LOW (ref >60)
GFR calc non Af Amer: 39 mL/min — ABNORMAL LOW (ref >60)
Glucose, Bld: 199 mg/dL — ABNORMAL HIGH (ref 65–99)
Potassium: 4.6 mmol/L (ref 3.5–5.1)
Sodium: 137 mmol/L (ref 135–145)
Total Bilirubin: 1.5 mg/dL — ABNORMAL HIGH (ref 0.3–1.2)
Total Protein: 6.6 g/dL (ref 6.5–8.1)

## 2017-06-05 LAB — BASIC METABOLIC PANEL
ANION GAP: 6 (ref 5–15)
BUN: 71 mg/dL — ABNORMAL HIGH (ref 6–20)
CHLORIDE: 113 mmol/L — AB (ref 101–111)
CO2: 20 mmol/L — AB (ref 22–32)
Calcium: 8.2 mg/dL — ABNORMAL LOW (ref 8.9–10.3)
Creatinine, Ser: 1.11 mg/dL — ABNORMAL HIGH (ref 0.44–1.00)
GFR calc non Af Amer: 45 mL/min — ABNORMAL LOW (ref >60)
GFR, EST AFRICAN AMERICAN: 53 mL/min — AB (ref >60)
Glucose, Bld: 113 mg/dL — ABNORMAL HIGH (ref 65–99)
POTASSIUM: 4.1 mmol/L (ref 3.5–5.1)
Sodium: 139 mmol/L (ref 135–145)

## 2017-06-05 LAB — PROTIME-INR
INR: 1.13
Prothrombin Time: 14.4 seconds (ref 11.4–15.2)

## 2017-06-05 LAB — HEMOGLOBIN AND HEMATOCRIT, BLOOD
HCT: 24.7 % — ABNORMAL LOW (ref 35.0–47.0)
HCT: 22.2 % — ABNORMAL LOW (ref 35.0–47.0)
HEMATOCRIT: 26.3 % — AB (ref 35.0–47.0)
HEMATOCRIT: 25.6 % — AB (ref 35.0–47.0)
HEMOGLOBIN: 8.7 g/dL — AB (ref 12.0–16.0)
HEMOGLOBIN: 7.9 g/dL — AB (ref 12.0–16.0)
HEMOGLOBIN: 8.4 g/dL — AB (ref 12.0–16.0)
Hemoglobin: 7.1 g/dL — ABNORMAL LOW (ref 12.0–16.0)

## 2017-06-05 LAB — PREPARE RBC (CROSSMATCH)

## 2017-06-05 LAB — URINALYSIS, COMPLETE (UACMP) WITH MICROSCOPIC
BILIRUBIN URINE: NEGATIVE
Bacteria, UA: NONE SEEN
GLUCOSE, UA: NEGATIVE mg/dL
Hgb urine dipstick: NEGATIVE
KETONES UR: NEGATIVE mg/dL
LEUKOCYTES UA: NEGATIVE
Nitrite: NEGATIVE
PH: 5 (ref 5.0–8.0)
PROTEIN: NEGATIVE mg/dL
Specific Gravity, Urine: 1.017 (ref 1.005–1.030)
WBC UA: NONE SEEN WBC/hpf (ref 0–5)

## 2017-06-05 LAB — TROPONIN I
Troponin I: 2.47 ng/mL (ref <0.03)
Troponin I: 2.97 ng/mL (ref <0.03)
Troponin I: 3.58 ng/mL (ref <0.03)

## 2017-06-05 LAB — BRAIN NATRIURETIC PEPTIDE: B Natriuretic Peptide: 379 pg/mL — ABNORMAL HIGH (ref 0.0–100.0)

## 2017-06-05 LAB — LIPASE, BLOOD: Lipase: 24 U/L (ref 11–51)

## 2017-06-05 LAB — GLUCOSE, CAPILLARY
GLUCOSE-CAPILLARY: 106 mg/dL — AB (ref 65–99)
Glucose-Capillary: 110 mg/dL — ABNORMAL HIGH (ref 65–99)

## 2017-06-05 LAB — ABO/RH: ABO/RH(D): A POS

## 2017-06-05 MED ORDER — CARVEDILOL 3.125 MG PO TABS
3.1250 mg | ORAL_TABLET | Freq: Two times a day (BID) | ORAL | Status: DC
  Administered 2017-06-05 – 2017-06-07 (×4): 3.125 mg via ORAL
  Filled 2017-06-05 (×4): qty 1

## 2017-06-05 MED ORDER — ACETAMINOPHEN 650 MG RE SUPP: 650.0000 mg | Freq: Four times a day (QID) | RECTAL | Status: DC | PRN

## 2017-06-05 MED ORDER — CLOPIDOGREL BISULFATE 75 MG PO TABS
75.0000 mg | ORAL_TABLET | Freq: Every day | ORAL | Status: DC
  Administered 2017-06-06 – 2017-06-07 (×2): 75 mg via ORAL
  Filled 2017-06-05 (×2): qty 1

## 2017-06-05 MED ORDER — SODIUM CHLORIDE 0.9 % IV SOLN
INTRAVENOUS | Status: DC
  Administered 2017-06-05: 06:00:00 via INTRAVENOUS

## 2017-06-05 MED ORDER — ACETAMINOPHEN 325 MG PO TABS
650.0000 mg | ORAL_TABLET | Freq: Four times a day (QID) | ORAL | Status: DC | PRN
  Administered 2017-06-05 (×2): 650 mg via ORAL
  Filled 2017-06-05 (×2): qty 2

## 2017-06-05 MED ORDER — ALBUTEROL SULFATE (2.5 MG/3ML) 0.083% IN NEBU: 2.5000 mg | INHALATION_SOLUTION | RESPIRATORY_TRACT | Status: DC | PRN

## 2017-06-05 MED ORDER — HYDROCODONE-ACETAMINOPHEN 7.5-325 MG PO TABS
1.0000 | ORAL_TABLET | Freq: Four times a day (QID) | ORAL | Status: DC | PRN
  Administered 2017-06-05 – 2017-06-06 (×4): 1 via ORAL
  Filled 2017-06-05 (×4): qty 1

## 2017-06-05 MED ORDER — PANTOPRAZOLE SODIUM 40 MG IV SOLR
40.0000 mg | Freq: Once | INTRAVENOUS | Status: AC
  Administered 2017-06-05: 40 mg via INTRAVENOUS
  Filled 2017-06-05: qty 40

## 2017-06-05 MED ORDER — ASPIRIN EC 81 MG PO TBEC
81.0000 mg | DELAYED_RELEASE_TABLET | Freq: Every day | ORAL | Status: DC
  Administered 2017-06-05: 81 mg via ORAL
  Filled 2017-06-05: qty 1

## 2017-06-05 MED ORDER — ONDANSETRON HCL 4 MG/2ML IJ SOLN: 4.0000 mg | Freq: Four times a day (QID) | INTRAMUSCULAR | Status: DC | PRN

## 2017-06-05 MED ORDER — ONDANSETRON HCL 4 MG/2ML IJ SOLN
4.0000 mg | Freq: Once | INTRAMUSCULAR | Status: AC | PRN
  Administered 2017-06-05: 4 mg via INTRAVENOUS
  Filled 2017-06-05: qty 2

## 2017-06-05 MED ORDER — ATORVASTATIN CALCIUM 20 MG PO TABS
80.0000 mg | ORAL_TABLET | Freq: Every day | ORAL | Status: DC
  Administered 2017-06-05 – 2017-06-06 (×2): 80 mg via ORAL
  Filled 2017-06-05 (×2): qty 4

## 2017-06-05 MED ORDER — NITROGLYCERIN 0.4 MG SL SUBL: 0.4000 mg | SUBLINGUAL_TABLET | SUBLINGUAL | Status: DC | PRN

## 2017-06-05 MED ORDER — SODIUM CHLORIDE 0.9 % IV SOLN
Freq: Once | INTRAVENOUS | Status: AC
  Administered 2017-06-05: 11:00:00 via INTRAVENOUS

## 2017-06-05 MED ORDER — SODIUM CHLORIDE 0.9 % IV BOLUS (SEPSIS)
1000.0000 mL | Freq: Once | INTRAVENOUS | Status: AC
  Administered 2017-06-05: 1000 mL via INTRAVENOUS

## 2017-06-05 MED ORDER — ONDANSETRON HCL 4 MG PO TABS: 4.0000 mg | ORAL_TABLET | Freq: Four times a day (QID) | ORAL | Status: DC | PRN

## 2017-06-05 MED ORDER — SODIUM CHLORIDE 0.9 % IV SOLN
8.0000 mg/h | INTRAVENOUS | Status: DC
  Administered 2017-06-05 – 2017-06-07 (×6): 8 mg/h via INTRAVENOUS
  Filled 2017-06-05 (×5): qty 80

## 2017-06-05 MED ORDER — TICAGRELOR 90 MG PO TABS
90.0000 mg | ORAL_TABLET | Freq: Two times a day (BID) | ORAL | Status: DC
  Administered 2017-06-05: 90 mg via ORAL
  Filled 2017-06-05: qty 1

## 2017-06-05 MED ORDER — SODIUM CHLORIDE 0.9 % IV SOLN: 8.0000 mg/h | INTRAVENOUS | Status: DC

## 2017-06-05 NOTE — Care Management (Signed)
Patient discharged 10/24 after stay for nstemi with pci and new Brilinta.  Presented back to ED late evening on 10/24 due to coffee ground emesis.  GI is consulting. Receiving blood transfusions. Notified [facility]

## 2017-06-05 NOTE — Progress Notes (Signed)
Patient evaluated on evening rounds HCT improved after PRBC x 1 She feels improved, less SOB Scheduled to receive another unit PRBC She denies any bowel movements. Ate broth , no N/V Talked with famoly at the bedside  Signed, Dossie Arbourim Gollan, MD, Ph.D [facility]

## 2017-06-05 NOTE — Progress Notes (Signed)
Talked to Dr. Cherlynn KaiserSainani about patient's multiple lab stick they are unable to draw blood in her bil. Upper extremities for type and cross, order ok to draw blood in the foot. Patient is to receive 2 units of PRBC. Also asked if patient need to be NPO and per MD will keep patient NPO.

## 2017-06-05 NOTE — Telephone Encounter (Signed)
Patient currently admitted at this time. 

## 2017-06-05 NOTE — Progress Notes (Signed)
Talked to Dr. Cherlynn KaiserSainani about patient's complaints of shortness of breath after trying to wean down to room air, patient SpO2 was 100% initially, place patient back to oxygen 2L [place], order to give PRN albuterol treatment. Patient's BP is also 92/46, patient has scheduled coreg, order to hold. Patient will place to clear liquid and will discontinue continuous IV fluids, patient had no episode of black stool. No other concern at the moment. RN will continue to monitor.

## 2017-06-05 NOTE — ED Triage Notes (Addendum)
Pt arrived via EMS from home. Per EMS, pt had MI on Sunday, discharged from hospital on Wednesday. Once returning home pt started to have N/V/D. Pt is A&O x4 but has O2 sat of 86% on RA. Pt c/o nausea and SOB but denies chest pain. Pt sts she has not started prescribed medications.

## 2017-06-05 NOTE — ED Provider Notes (Signed)
[facility] Emergency Department Provider Note   ____________________________________________   First MD Initiated Contact with Patient 06/05/17 0013     (approximate)  I have reviewed the triage vital signs and the nursing notes.   HISTORY  Chief Complaint Emesis    HPI Monica [name] is a 83 y.o. female brought to the ED from home via EMS with a chief complaint of nausea/vomiting/diarrhea and shortness of breath.  Patient was discharged home from the hospital yesterday following ST elevation MI.  Reports she was having vomiting and diarrhea prior to leaving the hospital. They worsened when she returned home.  Patient notes black stools.  Denies chest pain but complains of shortness of breath.  Denies fever, chills, abdominal pain, dysuria.  Discharged home on Brilinta and aspirin.   Past Medical History:  Diagnosis Date  . CAD (coronary artery disease) 2018    Patient Active Problem List   Diagnosis Date Noted  . Acute ST elevation myocardial infarction (STEMI) of inferior wall (HCC) 06/02/2017    Past Surgical History:  Procedure Laterality Date  . ANKLE RECONSTRUCTION  1956   also ORIF of right arm  . CORONARY STENT INTERVENTION N/A 06/01/2017   Procedure: Coronary/Graft Acute MI Revascularization;  Surgeon: Iran Ouch, MD;  Location: [facility] INVASIVE CV LAB;  Service: Cardiovascular;  Laterality: N/A;  . LEFT HEART CATH AND CORONARY ANGIOGRAPHY N/A 06/01/2017   Procedure: LEFT HEART CATH AND CORONARY ANGIOGRAPHY;  Surgeon: Iran Ouch, MD;  Location: [facility] INVASIVE CV LAB;  Service: Cardiovascular;  Laterality: N/A;    Prior to Admission medications   Medication Sig Start Date End Date Taking? Authorizing Provider  aspirin 81 MG chewable tablet Chew 1 tablet (81 mg total) by mouth daily. 06/04/17   Shaune Pollack, MD  atorvastatin (LIPITOR) 80 MG tablet Take 1 tablet (80 mg total) by mouth daily at 6 PM. 06/04/17   Shaune Pollack, MD    carvedilol (COREG) 3.125 MG tablet Take 1 tablet (3.125 mg total) by mouth 2 (two) times daily with a meal. 06/04/17   Shaune Pollack, MD  nitroGLYCERIN (NITROSTAT) 0.4 MG SL tablet Place 1 tablet (0.4 mg total) under the tongue every 5 (five) minutes as needed for chest pain. 06/04/17   Shaune Pollack, MD  ticagrelor (BRILINTA) 90 MG TABS tablet Take 1 tablet (90 mg total) by mouth 2 (two) times daily. 06/04/17   Shaune Pollack, MD    Allergies Patient has no known allergies.  Family History  Problem Relation Age of Onset  . Valvular heart disease Mother   . Diabetes Mellitus II Daughter     Social History Social History  Substance Use Topics  . Smoking status: Never Smoker  . Smokeless tobacco: Never Used  . Alcohol use Not on file    Review of Systems  Constitutional: No fever/chills. Eyes: No visual changes. ENT: No sore throat. Cardiovascular: Denies chest pain. Respiratory: Positive for shortness of breath. Gastrointestinal: No abdominal pain.  Positive for nausea, vomiting and diarrhea.  Positive for dark stools.  No constipation. Genitourinary: Negative for dysuria. Musculoskeletal: Negative for back pain. Skin: Negative for rash. Neurological: Negative for headaches, focal weakness or numbness.   ____________________________________________   PHYSICAL EXAM:  VITAL SIGNS: ED Triage Vitals  Enc Vitals Group     BP 06/05/17 0010 (!) 129/110     Pulse Rate 06/05/17 0007 (!) 118     Resp 06/05/17 0007 (!) 22     Temp 06/05/17 0007 98.3 F (  36.8 C)     Temp Source 06/05/17 0007 Oral     SpO2 06/05/17 0007 97 %     Weight 06/05/17 0008 200 lb (90.7 kg)     Height --      Head Circumference --      Peak Flow --      Pain Score --      Pain Loc --      Pain Edu? --      Excl. in GC? --     Constitutional: Alert and oriented.  Ill appearing and in mild acute distress. Eyes: Conjunctivae are normal. PERRL. EOMI. Head: Atraumatic. Nose: No  congestion/rhinnorhea. Mouth/Throat: Mucous membranes are moist.  Oropharynx non-erythematous. Neck: No stridor.   Cardiovascular: Tachycardic rate, regular rhythm. Grossly normal heart sounds.  Good peripheral circulation. Respiratory: Increased respiratory effort.  No retractions. Lungs diminished bibasilarly. Gastrointestinal: Obese.  Soft and nontender to light or deep palpation. No distention. No abdominal bruits. No CVA tenderness. Musculoskeletal: No lower extremity tenderness nor edema.  No joint effusions. Neurologic:  Normal speech and language. No gross focal neurologic deficits are appreciated.  Skin:  Skin is pale, cool, dry and intact. No rash noted. Psychiatric: Mood and affect are normal. Speech and behavior are normal.  ____________________________________________   LABS (all labs ordered are listed, but only abnormal results are displayed)  Labs Reviewed  COMPREHENSIVE METABOLIC PANEL - Abnormal; Notable for the following:       Result Value   CO2 18 (*)    Glucose, Bld 199 (*)    BUN 86 (*)    Creatinine, Ser 1.26 (*)    Albumin 3.4 (*)    Total Bilirubin 1.5 (*)    GFR calc non Af Amer 39 (*)    GFR calc Af Amer 45 (*)    All other components within normal limits  CBC - Abnormal; Notable for the following:    WBC 17.3 (*)    RBC 3.22 (*)    Hemoglobin 9.9 (*)    HCT 29.3 (*)    All other components within normal limits  TROPONIN I - Abnormal; Notable for the following:    Troponin I 3.58 (*)    All other components within normal limits  GASTROINTESTINAL PANEL BY PCR, STOOL (REPLACES STOOL CULTURE)  C DIFFICILE QUICK SCREEN W PCR REFLEX  CULTURE, BLOOD (ROUTINE X 2)  CULTURE, BLOOD (ROUTINE X 2)  LIPASE, BLOOD  URINALYSIS, COMPLETE (UACMP) WITH MICROSCOPIC  PROTIME-INR  LACTIC ACID, PLASMA  LACTIC ACID, PLASMA   ____________________________________________  EKG  ED ECG REPORT I, SUNG,JADE J, the attending physician, personally viewed and  interpreted this ECG.   Date: 06/05/2017  EKG Time: 0039  Rate: 117  Rhythm: sinus tachycardia  Axis: Normal  Intervals:none  ST&T Change: Nonspecific  ____________________________________________  RADIOLOGY  US Venous Img Lower Unilateral Right  Result Date: 06/04/2017 CLINICAL DATA:  Right lower extremity pain and edema for the past day. Evaluate for DVT. EXAM: RIGHT LOWER EXTREMITY VENOUS DOPPLER ULTRASOUND TECHNIQUE: Gray-scale sonography with graded compression, as well as color Doppler and duplex ultrasound were performed to evaluate the lower extremity deep venous systems from the level of the common femoral vein and including the common femoral, femoral, profunda femoral, popliteal and calf veins including the posterior tibial, peroneal and gastrocnemius veins when visible. The superficial great saphenous vein was also interrogated. Spectral Doppler was utilized to evaluate flow at rest and with distal augmentation maneuvers in the common femoral, femoral and  popliteal veins. COMPARISON:  None. FINDINGS: Contralateral Common Femoral Vein: Respiratory phasicity is normal and symmetric with the symptomatic side. No evidence of thrombus. Normal compressibility. Common Femoral Vein: No evidence of thrombus. Normal compressibility, respiratory phasicity and response to augmentation. Saphenofemoral Junction: No evidence of thrombus. Normal compressibility and flow on color Doppler imaging. Profunda Femoral Vein: No evidence of thrombus. Normal compressibility and flow on color Doppler imaging. Femoral Vein: No evidence of thrombus. Normal compressibility, respiratory phasicity and response to augmentation. Popliteal Vein: No evidence of thrombus. Normal compressibility, respiratory phasicity and response to augmentation. Calf Veins: No evidence of thrombus. Normal compressibility and flow on color Doppler imaging. Superficial Great Saphenous Vein: No evidence of thrombus. Normal compressibility.  Venous Reflux:  None. Other Findings:  None. IMPRESSION: No evidence of DVT within the right lower extremity. Electronically Signed   By: Simonne ComeJohn  Watts M.D.   On: 06/04/2017 11:39   Dg Chest Portable 1 View  Result Date: 06/05/2017 CLINICAL DATA:  Acute onset of nausea, vomiting and diarrhea. Decreased O2 saturation. Initial encounter. EXAM: PORTABLE CHEST 1 VIEW COMPARISON:  Chest radiograph performed 06/02/2017 FINDINGS: The lungs are well-aerated. Minimal right midlung scarring is noted. There is no evidence of pleural effusion or pneumothorax. The cardiomediastinal silhouette is within normal limits. No acute osseous abnormalities are seen. IMPRESSION: Minimal right midlung scarring.  Lungs otherwise clear. Electronically Signed   By: Roanna RaiderJeffery  Chang M.D.   On: 06/05/2017 00:27    ____________________________________________   PROCEDURES  Procedure(s) performed:   Rectal exam: External exam within normal limits without hemorrhoids or fissures.  Dark stool on gloved finger which is immediately heme positive.  Procedures  Critical Care performed: Yes, see critical care note(s)   CRITICAL CARE Performed by: Irean HongSUNG,JADE J   Total critical care time: 30 minutes  Critical care time was exclusive of separately billable procedures and treating other patients.  Critical care was necessary to treat or prevent imminent or life-threatening deterioration.  Critical care was time spent personally by me on the following activities: development of treatment plan with patient and/or surrogate as well as nursing, discussions with consultants, evaluation of patient's response to treatment, examination of patient, obtaining history from patient or surrogate, ordering and performing treatments and interventions, ordering and review of laboratory studies, ordering and review of radiographic studies, pulse oximetry and re-evaluation of patient's  condition.  ____________________________________________   INITIAL IMPRESSION / ASSESSMENT AND PLAN / ED COURSE  As part of my medical decision making, I reviewed the following data within the electronic MEDICAL RECORD NUMBER Nursing notes reviewed and incorporated, EKG interpreted , Radiograph reviewed, Discussed with admitting physician and Notes from prior ED visits.  83 year old female who presents with shortness of breath, N/V/D and dark stools following recent hospital discharge status post STEMI. Differential includes, but is not limited to, viral syndrome, bronchitis including COPD exacerbation, pneumonia, reactive airway disease including asthma, CHF including exacerbation with or without pulmonary/interstitial edema, pneumothorax, ACS, thoracic trauma, and pulmonary embolism, gastritis, upper GI bleed, lower GI bleed.  Patient is ill-appearing, tachycardic, borderline hypotensive with systolic in the 90s.  Will initiate IV fluid resuscitation, place second PIV, initiate Protonix bolus with drip.  Anticipate hospitalization.  Clinical Course as of Jun 05 109  Thu Jun 05, 2017  [id number] Updated patient of laboratory and imaging results.  Discussed with hospitalist Dr. Katheren ShamsSalary who will evaluate patient in the emergency department for admission.  [JS]    Clinical Course User Index [JS] Irean HongSung, Jade J, MD  ____________________________________________   FINAL CLINICAL IMPRESSION(S) / ED DIAGNOSES  Final diagnoses:  Nausea vomiting and diarrhea  Leukocytosis, unspecified type  Gastrointestinal hemorrhage, unspecified gastrointestinal hemorrhage type  Dehydration      NEW MEDICATIONS STARTED DURING THIS VISIT:  New Prescriptions   No medications on file     Note:  This document was prepared using Dragon voice recognition software and may include unintentional dictation errors.    Irean Hong, MD 06/05/17 [phone]

## 2017-06-05 NOTE — Progress Notes (Signed)
[employer] [facility] [place] [facility]   PATIENT NAME: Monica ArmourSylvia [name]    MR#:  [id number]  DATE OF BIRTH:  1933-12-25  SUBJECTIVE:   Patient here due to coffee-ground emesis and also melanotic stools. Was just discharged yesterday after a non-ST elevation MI status post cardiac catheterization and stent placement. Hemoglobin has trended down significantly and patient to be transfused 2 units of packed red blood cells. Denies any hematemesis, coffee-ground emesis, melena while in the hospital. Patient's husband and granddaughter is at bedside  REVIEW OF SYSTEMS:    Review of Systems  Constitutional: Negative for chills and fever.  HENT: Negative for congestion and tinnitus.   Eyes: Negative for blurred vision and double vision.  Respiratory: Negative for cough, shortness of breath and wheezing.   Cardiovascular: Negative for chest pain, orthopnea and PND.  Gastrointestinal: Negative for abdominal pain, diarrhea, nausea and vomiting.  Genitourinary: Negative for dysuria and hematuria.  Neurological: Positive for weakness. Negative for dizziness, sensory change and focal weakness.  All other systems reviewed and are negative.   Nutrition: NPO  Tolerating Diet: No Tolerating PT: Await Eval.   DRUG ALLERGIES:  No Known Allergies  VITALS:  Blood pressure 110/65, pulse (!) 107, temperature 98.1 F (36.7 C), temperature source Oral, resp. rate 18, height 5\' 3"  (1.6 m), weight 107 kg (235 lb 14.4 oz), SpO2 100 %.  PHYSICAL EXAMINATION:   Physical Exam  GENERAL:  83 y.o.-year-old patient lying in bed in no acute distress.  EYES: Pupils equal, round, reactive to light and accommodation. No scleral icterus. Extraocular muscles intact.  HEENT: Head atraumatic, normocephalic. Oropharynx and nasopharynx clear.  NECK:  Supple, no jugular venous distention. No thyroid enlargement, no tenderness.  LUNGS: Normal breath sounds bilaterally, no wheezing, rales, rhonchi. No use of  accessory muscles of respiration.  CARDIOVASCULAR: S1, S2 normal. III/VI SEM at RSB, No rubs, or gallops.  ABDOMEN: Soft, nontender, nondistended. Bowel sounds present. No organomegaly or mass.  EXTREMITIES: No cyanosis, clubbing or edema b/l.    NEUROLOGIC: Cranial nerves II through XII are intact. No focal Motor or sensory deficits b/l. Globally weak PSYCHIATRIC: The patient is alert and oriented x 3.  SKIN: No obvious rash, lesion, or ulcer.    LABORATORY PANEL:   CBC  Recent Labs Lab 06/05/17 0006  06/05/17 1332  WBC 17.3*  --   --   HGB 9.9*  < > 7.1*  HCT 29.3*  < > 22.2*  PLT 198  --   --   < > = values in this interval not displayed. ------------------------------------------------------------------------------------------------------------------  Chemistries   Recent Labs Lab 06/02/17 0615 06/05/17 0006 06/05/17 0733  NA 136 137 139  K 4.3 4.6 4.1  CL 109 110 113*  CO2 24 18* 20*  GLUCOSE 120* 199* 113*  BUN 22* 86* 71*  CREATININE 1.03* 1.26* 1.11*  CALCIUM 8.8* 8.9 8.2*  MG 2.0  --   --   AST  --  26  --   ALT  --  14  --   ALKPHOS  --  41  --   BILITOT  --  1.5*  --    ------------------------------------------------------------------------------------------------------------------  Cardiac Enzymes  Recent Labs Lab 06/05/17 1332  TROPONINI 2.47*   ------------------------------------------------------------------------------------------------------------------  RADIOLOGY:  [place] Venous Img Lower Unilateral Right  Result Date: 06/04/2017 CLINICAL DATA:  Right lower extremity pain and edema for the past day. Evaluate for DVT. EXAM: RIGHT LOWER EXTREMITY VENOUS DOPPLER ULTRASOUND TECHNIQUE: Gray-scale sonography with graded  compression, as well as color Doppler and duplex ultrasound were performed to evaluate the lower extremity deep venous systems from the level of the common femoral vein and including the common femoral, femoral, profunda femoral,  popliteal and calf veins including the posterior tibial, peroneal and gastrocnemius veins when visible. The superficial great saphenous vein was also interrogated. Spectral Doppler was utilized to evaluate flow at rest and with distal augmentation maneuvers in the common femoral, femoral and popliteal veins. COMPARISON:  None. FINDINGS: Contralateral Common Femoral Vein: Respiratory phasicity is normal and symmetric with the symptomatic side. No evidence of thrombus. Normal compressibility. Common Femoral Vein: No evidence of thrombus. Normal compressibility, respiratory phasicity and response to augmentation. Saphenofemoral Junction: No evidence of thrombus. Normal compressibility and flow on color Doppler imaging. Profunda Femoral Vein: No evidence of thrombus. Normal compressibility and flow on color Doppler imaging. Femoral Vein: No evidence of thrombus. Normal compressibility, respiratory phasicity and response to augmentation. Popliteal Vein: No evidence of thrombus. Normal compressibility, respiratory phasicity and response to augmentation. Calf Veins: No evidence of thrombus. Normal compressibility and flow on color Doppler imaging. Superficial Great Saphenous Vein: No evidence of thrombus. Normal compressibility. Venous Reflux:  None. Other Findings:  None. IMPRESSION: No evidence of DVT within the right lower extremity. Electronically Signed   By: Simonne Come M.D.   On: 06/04/2017 11:39   Dg Chest Portable 1 View  Result Date: 06/05/2017 CLINICAL DATA:  Acute onset of nausea, vomiting and diarrhea. Decreased O2 saturation. Initial encounter. EXAM: PORTABLE CHEST 1 VIEW COMPARISON:  Chest radiograph performed 06/02/2017 FINDINGS: The lungs are well-aerated. Minimal right midlung scarring is noted. There is no evidence of pleural effusion or pneumothorax. The cardiomediastinal silhouette is within normal limits. No acute osseous abnormalities are seen. IMPRESSION: Minimal right midlung scarring.  Lungs  otherwise clear. Electronically Signed   By: Roanna Raider M.D.   On: 06/05/2017 00:27     ASSESSMENT AND PLAN:   83 year old female with past medical history of hypertension, hyperlipidemia, aortic stenosis, coronary artery disease status post cardiac catheterization and stent to the RCA who was just discharged to the hospital yesterday and returns back due to coffee-ground emesis, melanotic stools.  1. GI bleed-this is a acute upper GI bleed given the patient's melena and coffee-ground emesis. -Patient was started on dual antiplatelet therapy due to her cardiac catheterization and non-ST elevation MI. Anticoagulation presently cannot be stopped. -We'll do supportive care with blood transfusions and patient to be transfused 2 units of packed red blood cells today. Seen by gastroenterology and no plans for acute intervention unless patient continues to have recurrent bleeding. We'll follow serial hemoglobins. Continue Protonix drip.  2. Acute blood loss anemia-secondary to the GI bleed. Patient to be transfused 2 units of packed red blood cells today and will follow hemoglobin.  3. Recent non-ST elevation MI status post drug-eluting stent placement-currently not having any chest pain or shortness of breath. -Continue aspirin, Plavix, carvedilol, atorvastatin.  4. Essential hypertension-continue carvedilol.  5. Hyperlipidemia continue atorvastatin.    All the records are reviewed and case discussed with Care Management/Social Worker. Management plans discussed with the patient, family and they are in agreement.  CODE STATUS: Full code  DVT Prophylaxis: Ted's & SCD's  TOTAL TIME TAKING CARE OF THIS PATIENT: 30 minutes.   POSSIBLE D/C IN 2-3 DAYS, DEPENDING ON CLINICAL CONDITION.   Houston Siren M.D on 06/05/2017 at 3:56 PM  Between 7am to 6pm - Pager - [phone]  After 6pm go to  [email or web address] - Social research officer, government  [facility] [place] [facility]  Office   [phone]  CC: Primary care physician; Patient, No Pcp Per

## 2017-06-05 NOTE — Plan of Care (Signed)
Problem: Bowel/Gastric: Goal: Will show no signs and symptoms of gastrointestinal bleeding Outcome: Progressing No stools noted since arrival.

## 2017-06-05 NOTE — H&P (Addendum)
[facility] [place] [facility]   PATIENT NAME: Monica [name]    MR#:  [id number]  DATE OF BIRTH:  1933/11/17  DATE OF ADMISSION:  06/04/2017  PRIMARY CARE PHYSICIAN: Patient, No Pcp Per   REQUESTING/REFERRING PHYSICIAN:   CHIEF COMPLAINT:   Chief Complaint  Patient presents with  . Emesis    HISTORY OF PRESENT ILLNESS: Monica [name]  is a 83 y.o. female with a known history of coronary artery disease, status post recent STEMI with the coronary stent drug-eluting to the right proximal to mid right coronary artery which was done a day ago. Patient was discharged home yesterday. She felt very weak and dizzy she had nausea and vomiting and also had diarrhea. Patient says she had black stool and vomited black stuff. Has generalized weakness. No complaints of any chest pain. She was evaluated in the emergency room her hemoglobin was low around 9.9. Her last hemoglobin on 22nd of October was 13.8. Patient was started on IV Protonix drip in the emergency room for gastrointestinal bleeding. Currently patient is on aspirin and Brilinta. She took her medications yesterday. Hospitalist service was consulted for further care. No complaints of any shortness of breath, orthopnea. Her lactic acid level was mildly elevated. Troponin was 3.58 but was trending down compared to the numbers that were checked at the time of discharge when she had ST elevation MI. Venous Doppler ultrasound of right lower extremity no DVT.  PAST MEDICAL HISTORY:   Past Medical History:  Diagnosis Date  . CAD (coronary artery disease) 2018    PAST SURGICAL HISTORY: Past Surgical History:  Procedure Laterality Date  . ANKLE RECONSTRUCTION  1956   also ORIF of right arm  . CORONARY STENT INTERVENTION N/A 06/01/2017   Procedure: Coronary/Graft Acute MI Revascularization;  Surgeon: Iran Ouch, MD;  Location: [facility] INVASIVE CV LAB;  Service: Cardiovascular;  Laterality: N/A;  . LEFT HEART  CATH AND CORONARY ANGIOGRAPHY N/A 06/01/2017   Procedure: LEFT HEART CATH AND CORONARY ANGIOGRAPHY;  Surgeon: Iran Ouch, MD;  Location: [facility] INVASIVE CV LAB;  Service: Cardiovascular;  Laterality: N/A;    SOCIAL HISTORY:  Social History  Substance Use Topics  . Smoking status: Never Smoker  . Smokeless tobacco: Never Used  . Alcohol use No    FAMILY HISTORY:  Family History  Problem Relation Age of Onset  . Valvular heart disease Mother   . Diabetes Mellitus II Daughter     DRUG ALLERGIES: No Known Allergies  REVIEW OF SYSTEMS:   CONSTITUTIONAL: No fever, has weakness.  EYES: No blurred or double vision.  EARS, NOSE, AND THROAT: No tinnitus or ear pain.  RESPIRATORY: No cough, shortness of breath, wheezing or hemoptysis.  CARDIOVASCULAR: No chest pain, orthopnea, edema.  GASTROINTESTINAL: Has nausea, vomiting, diarrhea , mild abdominal pain.  Black stool GENITOURINARY: No dysuria, hematuria.  ENDOCRINE: No polyuria, nocturia,  HEMATOLOGY: Has anemia SKIN: No rash or lesion. MUSCULOSKELETAL: No joint pain or arthritis.   NEUROLOGIC: No tingling, numbness, weakness.  PSYCHIATRY: No anxiety or depression.   MEDICATIONS AT HOME:  Prior to Admission medications   Medication Sig Start Date End Date Taking? Authorizing Provider  aspirin 81 MG chewable tablet Chew 1 tablet (81 mg total) by mouth daily. 06/04/17  Yes Shaune Pollack, MD  atorvastatin (LIPITOR) 80 MG tablet Take 1 tablet (80 mg total) by mouth daily at 6 PM. 06/04/17  Yes Shaune Pollack, MD  carvedilol (COREG) 3.125 MG tablet Take 1 tablet (  3.125 mg total) by mouth 2 (two) times daily with a meal. 06/04/17  Yes Shaune Pollack, MD  nitroGLYCERIN (NITROSTAT) 0.4 MG SL tablet Place 1 tablet (0.4 mg total) under the tongue every 5 (five) minutes as needed for chest pain. 06/04/17  Yes Shaune Pollack, MD  ticagrelor (BRILINTA) 90 MG TABS tablet Take 1 tablet (90 mg total) by mouth 2 (two) times daily. 06/04/17  Yes Shaune Pollack,  MD      PHYSICAL EXAMINATION:   VITAL SIGNS: Blood pressure (!) 126/59, pulse (!) 105, temperature 98.5 F (36.9 C), temperature source Rectal, resp. rate 18, weight 90.7 kg (200 lb), SpO2 100 %.  GENERAL:  83 y.o.-year-old patient lying in the bed with no acute distress.  EYES: Pupils equal, round, reactive to light and accommodation. No scleral icterus. Extraocular muscles intact. Pallor present. HEENT: Head atraumatic, normocephalic. Oropharynx dry and nasopharynx clear.  NECK:  Supple, no jugular venous distention. No thyroid enlargement, no tenderness.  LUNGS: Normal breath sounds bilaterally, no wheezing, rales,rhonchi or crepitation. No use of accessory muscles of respiration.  CARDIOVASCULAR: S1, S2 tachycardia noted. No murmurs, rubs, or gallops.  ABDOMEN: Soft, nontender, nondistended. Bowel sounds present. No organomegaly or mass.  EXTREMITIES: No pedal edema, cyanosis, or clubbing.  NEUROLOGIC: Cranial nerves II through XII are intact. Muscle strength 5/5 in all extremities. Sensation intact. Gait not checked.  PSYCHIATRIC: The patient is alert and oriented x 3.  SKIN: No obvious rash, lesion, or ulcer.   LABORATORY PANEL:   CBC  Recent Labs Lab 06/01/17 2228 06/02/17 0615 06/05/17 0006  WBC 10.7 9.8 17.3*  HGB 15.5 13.8 9.9*  HCT 46.6 41.1 29.3*  PLT 246 216 198  MCV 90.2 90.4 91.0  MCH 29.9 30.3 30.9  MCHC 33.2 33.5 34.0  RDW 13.7 13.5 13.7  LYMPHSABS 4.1*  --   --   MONOABS 1.0*  --   --   EOSABS 0.3  --   --   BASOSABS 0.1  --   --    ------------------------------------------------------------------------------------------------------------------  Chemistries   Recent Labs Lab 06/01/17 2228 06/02/17 0615 06/05/17 0006  NA 138 136 137  K 3.8 4.3 4.6  CL 104 109 110  CO2 25 24 18*  GLUCOSE 162* 120* 199*  BUN 24* 22* 86*  CREATININE 1.25* 1.03* 1.26*  CALCIUM 9.6 8.8* 8.9  MG  --  2.0  --   AST 21  --  26  ALT 14  --  14  ALKPHOS 61  --   41  BILITOT 1.1  --  1.5*   ------------------------------------------------------------------------------------------------------------------ estimated creatinine clearance is 37.4 mL/min (A) (by C-G formula based on SCr of 1.26 mg/dL (H)). ------------------------------------------------------------------------------------------------------------------ No results for input(s): TSH, T4TOTAL, T3FREE, THYROIDAB in the last 72 hours.  Invalid input(s): FREET3   Coagulation profile  Recent Labs Lab 06/01/17 2228 06/05/17 0006  INR 0.99 1.13   ------------------------------------------------------------------------------------------------------------------- No results for input(s): DDIMER in the last 72 hours. -------------------------------------------------------------------------------------------------------------------  Cardiac Enzymes  Recent Labs Lab 06/02/17 0615 06/03/17 0837 06/05/17 0006  TROPONINI 13.00* 8.00* 3.58*   ------------------------------------------------------------------------------------------------------------------ Invalid input(s): POCBNP  ---------------------------------------------------------------------------------------------------------------  Urinalysis    Component Value Date/Time   COLORURINE YELLOW (A) 06/05/2017 0041   APPEARANCEUR CLOUDY (A) 06/05/2017 0041   LABSPEC 1.017 06/05/2017 0041   PHURINE 5.0 06/05/2017 0041   GLUCOSEU NEGATIVE 06/05/2017 0041   HGBUR NEGATIVE 06/05/2017 0041   BILIRUBINUR NEGATIVE 06/05/2017 0041   KETONESUR NEGATIVE 06/05/2017 0041   PROTEINUR NEGATIVE 06/05/2017 0041  NITRITE NEGATIVE 06/05/2017 0041   LEUKOCYTESUR NEGATIVE 06/05/2017 0041     RADIOLOGY: US Venous Img Lower Unilateral Right  Result Date: 06/04/2017 CLINICAL DATA:  Right lower extremity pain and edema for the past day. Evaluate for DVT. EXAM: RIGHT LOWER EXTREMITY VENOUS DOPPLER ULTRASOUND TECHNIQUE: Gray-scale sonography  with graded compression, as well as color Doppler and duplex ultrasound were performed to evaluate the lower extremity deep venous systems from the level of the common femoral vein and including the common femoral, femoral, profunda femoral, popliteal and calf veins including the posterior tibial, peroneal and gastrocnemius veins when visible. The superficial great saphenous vein was also interrogated. Spectral Doppler was utilized to evaluate flow at rest and with distal augmentation maneuvers in the common femoral, femoral and popliteal veins. COMPARISON:  None. FINDINGS: Contralateral Common Femoral Vein: Respiratory phasicity is normal and symmetric with the symptomatic side. No evidence of thrombus. Normal compressibility. Common Femoral Vein: No evidence of thrombus. Normal compressibility, respiratory phasicity and response to augmentation. Saphenofemoral Junction: No evidence of thrombus. Normal compressibility and flow on color Doppler imaging. Profunda Femoral Vein: No evidence of thrombus. Normal compressibility and flow on color Doppler imaging. Femoral Vein: No evidence of thrombus. Normal compressibility, respiratory phasicity and response to augmentation. Popliteal Vein: No evidence of thrombus. Normal compressibility, respiratory phasicity and response to augmentation. Calf Veins: No evidence of thrombus. Normal compressibility and flow on color Doppler imaging. Superficial Great Saphenous Vein: No evidence of thrombus. Normal compressibility. Venous Reflux:  None. Other Findings:  None. IMPRESSION: No evidence of DVT within the right lower extremity. Electronically Signed   By: Simonne Come M.D.   On: 06/04/2017 11:39   Dg Chest Portable 1 View  Result Date: 06/05/2017 CLINICAL DATA:  Acute onset of nausea, vomiting and diarrhea. Decreased O2 saturation. Initial encounter. EXAM: PORTABLE CHEST 1 VIEW COMPARISON:  Chest radiograph performed 06/02/2017 FINDINGS: The lungs are well-aerated. Minimal  right midlung scarring is noted. There is no evidence of pleural effusion or pneumothorax. The cardiomediastinal silhouette is within normal limits. No acute osseous abnormalities are seen. IMPRESSION: Minimal right midlung scarring.  Lungs otherwise clear. Electronically Signed   By: Roanna Raider M.D.   On: 06/05/2017 00:27    EKG: Orders placed or performed during the hospital encounter of 06/04/17  . ED EKG  . ED EKG  . EKG 12-Lead  . EKG 12-Lead  . EKG 12-Lead  . EKG 12-Lead    IMPRESSION AND PLAN: 83 year old elderly female patient with history of coronary artery disease, recent ST elevation MI, cardiac stent presented to the emergency room with nausea, vomiting and black stool.  Admitting diagnosis 1. Acute gastrointestinal bleeding 2. Symptomatic anemia 3. Dehydration 4.Nausea vomiting. 5.coronary artery disease with cardiac stent   Treatment plan Admit patient to telemetry IV fluid hydration IV Protonix drip Hold blood thinner medication Serial hemoglobin and hematocrit monitoring Gastroenterology consultation Cardiology consultation Follow-up lactic acid level DVT prophylaxis sequential compression devices to lower extremities   All the records are reviewed and case discussed with ED provider. Management plans discussed with the patient, family and they are in agreement.  CODE STATUS: Full code Code Status History    Date Active Date Inactive Code Status Order ID Comments User Context   06/02/2017 12:59 AM 06/04/2017  7:32 PM Full Code [id number]  Iran Ouch, MD Inpatient       TOTAL TIME TAKING CARE OF THIS PATIENT: 57 minutes.    Ihor Austin M.D on 06/05/2017 at 3:18 AM  Between 7am to 6pm - Pager - [phone]  After 6pm go to [email or web address] - password [email or web address]  [facility]  Office  [phone]  CC: Primary care physician; Patient, No Pcp Per

## 2017-06-05 NOTE — Consult Note (Signed)
Midge Minium, MD [facility]  [address], [address] [place], [place] [address] Phone: [phone] Fax : [phone]  Consultation  Referring Provider:     Dr. Tobi Bastos Primary Care Physician:  Patient, No Pcp Per Primary Gastroenterologist:  Gentry Fitz         Reason for Consultation:     Melena and hematemesis  Date of Admission:  06/04/2017 Date of Consultation:  06/05/2017         HPI:   Monica [name] is a 83 y.o. female Who was recently admitted with a STEMI and had a drug eluding stent placed.  The patient reports that she was having some diarrhea before she was discharged but there is no report of her reporting any black stools or bloody stools.  The patient went home and started to have nausea and vomiting of dark material with black stools.  The patient's troponin 3 days ago was 13 that went down to 8 and is now just below 3.  She was feeling lightheaded and weak and was noted to have a drop in her hemoglobin. The patient's hemoglobin back on October On a second was 13.8 and she came in with a hemoglobin of 9.1 that has gone down to 8.7 at 3 AM and then was 7.9 this morning and this afternoon was 7.1.  The patient has had no further vomiting since she was admitted. The patient's repeat troponin this afternoon was 2.47.  The patient has been ordered 2 units of blood.  Has not received that yet.  Past Medical History:  Diagnosis Date  . CAD (coronary artery disease) 2018    Past Surgical History:  Procedure Laterality Date  . ANKLE RECONSTRUCTION  1956   also ORIF of right arm  . CORONARY STENT INTERVENTION N/A 06/01/2017   Procedure: Coronary/Graft Acute MI Revascularization;  Surgeon: Iran Ouch, MD;  Location: [facility] INVASIVE CV LAB;  Service: Cardiovascular;  Laterality: N/A;  . LEFT HEART CATH AND CORONARY ANGIOGRAPHY N/A 06/01/2017   Procedure: LEFT HEART CATH AND CORONARY ANGIOGRAPHY;  Surgeon: Iran Ouch, MD;  Location: [facility] INVASIVE CV LAB;  Service:  Cardiovascular;  Laterality: N/A;    Prior to Admission medications   Medication Sig Start Date End Date Taking? Authorizing Provider  aspirin 81 MG chewable tablet Chew 1 tablet (81 mg total) by mouth daily. 06/04/17  Yes Shaune Pollack, MD  atorvastatin (LIPITOR) 80 MG tablet Take 1 tablet (80 mg total) by mouth daily at 6 PM. 06/04/17  Yes Shaune Pollack, MD  carvedilol (COREG) 3.125 MG tablet Take 1 tablet (3.125 mg total) by mouth 2 (two) times daily with a meal. 06/04/17  Yes Shaune Pollack, MD  nitroGLYCERIN (NITROSTAT) 0.4 MG SL tablet Place 1 tablet (0.4 mg total) under the tongue every 5 (five) minutes as needed for chest pain. 06/04/17  Yes Shaune Pollack, MD  ticagrelor (BRILINTA) 90 MG TABS tablet Take 1 tablet (90 mg total) by mouth 2 (two) times daily. 06/04/17  Yes Shaune Pollack, MD    Family History  Problem Relation Age of Onset  . Valvular heart disease Mother   . Diabetes Mellitus II Daughter      Social History  Substance Use Topics  . Smoking status: Never Smoker  . Smokeless tobacco: Never Used  . Alcohol use No    Allergies as of 06/04/2017  . (No Known Allergies)    Review of Systems:    All systems reviewed and negative except where noted in HPI.  Physical Exam:  Vital signs in last 24 hours: Temp:  [98.1 F (36.7 C)-98.5 F (36.9 C)] 98.1 F (36.7 C) (10/25 0456) Pulse Rate:  [105-118] 107 (10/25 0456) Resp:  [15-22] 18 (10/25 0230) BP: (103-131)/(59-110) 110/65 (10/25 0456) SpO2:  [97 %-100 %] 100 % (10/25 0456) Weight:  [200 lb (90.7 kg)-235 lb 14.4 oz (107 kg)] 235 lb 14.4 oz (107 kg) (10/25 0454) Last BM Date: 06/04/17 General:   Pleasant, cooperative in NAD Head:  Normocephalic and atraumatic. Eyes:   No icterus.   Conjunctiva pink. PERRLA. Ears:  Normal auditory acuity. Neck:  Supple; no masses or thyroidomegaly Lungs: Respirations even and unlabored. Lungs clear to auscultation bilaterally.   No wheezes, crackles, or rhonchi.  Heart:  Regular rate and  rhythm;  Positive murmur, clicks, rubs or gallops Abdomen:  Soft, nondistended, nontender. Normal bowel sounds. No appreciable masses or hepatomegaly.  No rebound or guarding.  Rectal:  Not performed. Msk:  Symmetrical without gross deformities.    Extremities:  Without edema, cyanosis or clubbing. Neurologic:  Alert and oriented x3;  grossly normal neurologically. Skin:  Intact without significant lesions or rashes. Cervical Nodes:  No significant cervical adenopathy. Psych:  Alert and cooperative. Normal affect.  LAB RESULTS:  Recent Labs  06/05/17 0006 06/05/17 0335 06/05/17 0733  WBC 17.3*  --   --   HGB 9.9* 8.7* 7.9*  HCT 29.3* 26.3* 24.7*  PLT 198  --   --    BMET  Recent Labs  06/05/17 0006 06/05/17 0733  NA 137 139  K 4.6 4.1  CL 110 113*  CO2 18* 20*  GLUCOSE 199* 113*  BUN 86* 71*  CREATININE 1.26* 1.11*  CALCIUM 8.9 8.2*   LFT  Recent Labs  06/05/17 0006  PROT 6.6  ALBUMIN 3.4*  AST 26  ALT 14  ALKPHOS 41  BILITOT 1.5*   PT/INR  Recent Labs  06/05/17 0006  LABPROT 14.4  INR 1.13    STUDIES: [place] Venous Img Lower Unilateral Right  Result Date: 06/04/2017 CLINICAL DATA:  Right lower extremity pain and edema for the past day. Evaluate for DVT. EXAM: RIGHT LOWER EXTREMITY VENOUS DOPPLER ULTRASOUND TECHNIQUE: Gray-scale sonography with graded compression, as well as color Doppler and duplex ultrasound were performed to evaluate the lower extremity deep venous systems from the level of the common femoral vein and including the common femoral, femoral, profunda femoral, popliteal and calf veins including the posterior tibial, peroneal and gastrocnemius veins when visible. The superficial great saphenous vein was also interrogated. Spectral Doppler was utilized to evaluate flow at rest and with distal augmentation maneuvers in the common femoral, femoral and popliteal veins. COMPARISON:  None. FINDINGS: Contralateral Common Femoral Vein: Respiratory  phasicity is normal and symmetric with the symptomatic side. No evidence of thrombus. Normal compressibility. Common Femoral Vein: No evidence of thrombus. Normal compressibility, respiratory phasicity and response to augmentation. Saphenofemoral Junction: No evidence of thrombus. Normal compressibility and flow on color Doppler imaging. Profunda Femoral Vein: No evidence of thrombus. Normal compressibility and flow on color Doppler imaging. Femoral Vein: No evidence of thrombus. Normal compressibility, respiratory phasicity and response to augmentation. Popliteal Vein: No evidence of thrombus. Normal compressibility, respiratory phasicity and response to augmentation. Calf Veins: No evidence of thrombus. Normal compressibility and flow on color Doppler imaging. Superficial Great Saphenous Vein: No evidence of thrombus. Normal compressibility. Venous Reflux:  None. Other Findings:  None. IMPRESSION: No evidence of DVT within the right lower extremity. Electronically Signed  By: Simonne Come M.D.   On: 06/04/2017 11:39   Dg Chest Portable 1 View  Result Date: 06/05/2017 CLINICAL DATA:  Acute onset of nausea, vomiting and diarrhea. Decreased O2 saturation. Initial encounter. EXAM: PORTABLE CHEST 1 VIEW COMPARISON:  Chest radiograph performed 06/02/2017 FINDINGS: The lungs are well-aerated. Minimal right midlung scarring is noted. There is no evidence of pleural effusion or pneumothorax. The cardiomediastinal silhouette is within normal limits. No acute osseous abnormalities are seen. IMPRESSION: Minimal right midlung scarring.  Lungs otherwise clear. Electronically Signed   By: Roanna Raider M.D.   On: 06/05/2017 00:27      Impression / Plan:   Monica [name] is a 83 y.o. y/o female with status post a STEMI with a stent placement and the patient was put on anticoagulation and aspirin. The patient was then discharged and came back with black stools and vomiting blood.  The patient has been noted to have a  drop in her hemoglobin.  I have spoken to the cardiologist and the hospitalist about the patient and the patient is high risk for any endoscopic procedures due to her recent coronary event.  The patient will be given 2 units of blood and if the patient's blood count does not appropriately track up or the patient continues to bleed then we may have no choice but to risk an EGD on this patient.  The patient and her family have been informed that any endoscopy on the patient so close to a recent cardiac event can cause cardiac arrest and death.  I will continue to monitor the patient with you.  Thank you for involving me in the care of this patient.      LOS: 0 days   Midge Minium, MD  06/05/2017, 1:36 PM   Note: This dictation was prepared with Dragon dictation along with smaller phrase technology. Any transcriptional errors that result from this process are unintentional.

## 2017-06-05 NOTE — Consult Note (Signed)
Cardiology Consultation:   Patient ID: Monica [name]; [id number]; [birth year]   Admit date: 06/04/2017 Date of Consult: 06/05/2017  Primary Care Provider: Patient, No Pcp Per Primary Cardiologist: Kirke CorinArida   Patient Profile:   Monica [name] is a 83 y.o. female with a hx of recent inferior ST elevation MI on 10/22 s/p PCI/DEs to the RCA as detailed below, moderate aortic stenosis, HLD and tobacco abuse who is being seen today for the evaluation of GI bleed at the request of Dr. Tobi BastosPyreddy, MD.  History of Present Illness:   Monica [name] was recently admitted as an acute inferior ST elevation with successful PCI/DES to the RCA. Post procedure care with briefly complicated by hypotension and bradycardia requiring short course of Levophed. Echo showed EF 55-60%, hypokinesis of the basal-midinferior myocardium, Gr1DD, moderate to severe AS with a mean gradient of 21 and a valve area of 0.99. She did well post procedure and ambulated without issues. Troponin peaked at 13. On 10/24 she did have a fair amount of diarrhea with associated positional dizziness. CBC from 10/23 showed a stable HGB as was unremarkable. She was discharged on DAPT on 10/24. Upon arriving at home she continued to have diarrhea and began to develop emesis that was very dark/block. Her diarrhea was consistent with melena. There was also associated increased fatigue and dizziness. [facility] where she was noted to be newly anemic with a HGB of 9.9 (prior 13.8 on 10/23), WBC 17.3. HGB has continued to down trend to 7.9 at time of cardiology consult. Troponin is down trending, consistent with prior elevation of 13 from st elevation MI. BP has been soft in the 80s systolic, though is currently stable at 110 systolic. No chest pain. [facility] she last took Brilinta in the evening of 10/24.    Past Medical History:  Diagnosis Date  . CAD (coronary artery disease) 2018    Past Surgical History:    Procedure Laterality Date  . ANKLE RECONSTRUCTION  1956   also ORIF of right arm  . CORONARY STENT INTERVENTION N/A 06/01/2017   Procedure: Coronary/Graft Acute MI Revascularization;  Surgeon: Iran OuchArida, Muhammad A, MD;  Location: [facility] INVASIVE CV LAB;  Service: Cardiovascular;  Laterality: N/A;  . LEFT HEART CATH AND CORONARY ANGIOGRAPHY N/A 06/01/2017   Procedure: LEFT HEART CATH AND CORONARY ANGIOGRAPHY;  Surgeon: Iran OuchArida, Muhammad A, MD;  Location: [facility] INVASIVE CV LAB;  Service: Cardiovascular;  Laterality: N/A;     Home Meds: Prior to Admission medications   Medication Sig Start Date End Date Taking? Authorizing Provider  aspirin 81 MG chewable tablet Chew 1 tablet (81 mg total) by mouth daily. 06/04/17  Yes Shaune Pollackhen, Qing, MD  atorvastatin (LIPITOR) 80 MG tablet Take 1 tablet (80 mg total) by mouth daily at 6 PM. 06/04/17  Yes Shaune Pollackhen, Qing, MD  carvedilol (COREG) 3.125 MG tablet Take 1 tablet (3.125 mg total) by mouth 2 (two) times daily with a meal. 06/04/17  Yes Shaune Pollackhen, Qing, MD  nitroGLYCERIN (NITROSTAT) 0.4 MG SL tablet Place 1 tablet (0.4 mg total) under the tongue every 5 (five) minutes as needed for chest pain. 06/04/17  Yes Shaune Pollackhen, Qing, MD  ticagrelor (BRILINTA) 90 MG TABS tablet Take 1 tablet (90 mg total) by mouth 2 (two) times daily. 06/04/17  Yes Shaune Pollackhen, Qing, MD    Inpatient Medications: Scheduled Meds: . aspirin EC  81 mg Oral Daily  . atorvastatin  80 mg Oral q1800  . carvedilol  3.125 mg Oral BID WC  . ticagrelor  90 mg Oral BID   Continuous Infusions: . sodium chloride 100 mL/hr at 06/05/17 0613  . pantoprozole (PROTONIX) infusion 8 mg/hr (06/05/17 0114)   PRN Meds: acetaminophen **OR** acetaminophen, nitroGLYCERIN, ondansetron **OR** ondansetron (ZOFRAN) IV  Allergies:  No Known Allergies  Social History:   Social History   Social History  . Marital status: Married    Spouse name: N/A  . Number of children: N/A  . Years of education: N/A   Occupational History   . retired    Social History Main Topics  . Smoking status: Never Smoker  . Smokeless tobacco: Never Used  . Alcohol use No  . Drug use: No  . Sexual activity: No   Other Topics Concern  . Not on file   Social History Narrative  . No narrative on file     Family History:   Family History  Problem Relation Age of Onset  . Valvular heart disease Mother   . Diabetes Mellitus II Daughter     ROS:  Review of Systems  Constitutional: Positive for malaise/fatigue. Negative for chills, diaphoresis, fever and weight loss.  HENT: Negative for congestion.   Eyes: Negative for discharge and redness.  Respiratory: Negative for cough, hemoptysis, sputum production, shortness of breath and wheezing.   Cardiovascular: Negative for chest pain, palpitations, orthopnea, claudication, leg swelling and PND.  Gastrointestinal: Positive for diarrhea, melena and vomiting. Negative for abdominal pain, blood in stool, heartburn and nausea.       Vomit black  Genitourinary: Negative for hematuria.  Musculoskeletal: Positive for myalgias. Negative for falls.       Contusion RLE  Skin: Negative for rash.  Neurological: Positive for weakness. Negative for dizziness, tingling, tremors, sensory change, speech change, focal weakness and loss of consciousness.  Endo/Heme/Allergies: Does not bruise/bleed easily.  Psychiatric/Behavioral: Negative for substance abuse. The patient is not nervous/anxious.   All other systems reviewed and are negative.     Physical Exam/Data:   Vitals:   06/05/17 0230 06/05/17 0300 06/05/17 0454 06/05/17 0456  BP: 113/73 (!) 126/59  110/65  Pulse: (!) 118 (!) 105  (!) 107  Resp: 18     Temp:    98.1 F (36.7 C)  TempSrc:    Oral  SpO2: 100% 100%  100%  Weight:   235 lb 14.4 oz (107 kg)   Height:   5\' 3"  (1.6 m)     Intake/Output Summary (Last 24 hours) at 06/05/17 0802 Last data filed at 06/05/17 0333  Gross per 24 hour  Intake             1000 ml  Output                 0 ml  Net             1000 ml   Filed Weights   06/05/17 0008 06/05/17 0454  Weight: 200 lb (90.7 kg) 235 lb 14.4 oz (107 kg)   Body mass index is 41.79 kg/m.   Physical Exam: General: Pale appearing, in no acute distress. Head: Normocephalic, atraumatic, sclera non-icteric, no xanthomas, nares without discharge. Neck: Negative for carotid bruits. JVD not elevated. Lungs: Clear bilaterally to auscultation without wheezes, rales, or rhonchi. Breathing is unlabored. Heart: Mildly tachycardic with S1 S2. II/VI systolic murmur, no rubs, or gallops appreciated. Abdomen: Soft, non-tender, non-distended with normoactive bowel sounds. No hepatomegaly. No rebound/guarding. No obvious abdominal masses. Msk:  Strength  and tone appear normal for age. Extremities: Contusion noted along lateral aspect of RLE. No edema. Distal pedal pulses are 2+ and equal bilaterally. Neuro: Alert and oriented X 3. No facial asymmetry. No focal deficit. Moves all extremities spontaneously. Psych:  Responds to questions appropriately with a normal affect.   EKG:  The EKG was personally reviewed and demonstrates: sinus tachycardia, 117 bpm, prior inferior infarct with lateral st depression Telemetry:  Telemetry was personally reviewed and demonstrates: sinus tachycardia, low 100s bpm  Weights: Filed Weights   06/05/17 0008 06/05/17 0454  Weight: 200 lb (90.7 kg) 235 lb 14.4 oz (107 kg)    Relevant CV Studies: LHC 06/02/2017: Coronary Findings   Dominance: Right  Left Main  Vessel is angiographically normal.  LM lesion, 20% stenosed.  Left Anterior Descending  There is mild the vessel.  First Diagonal Branch  Vessel is large in size.  Ost 1st Diag to 1st Diag lesion, 20% stenosed.  Third Diagonal Branch  Vessel is small in size. There is mild disease in the vessel.  Left Circumflex  Ost Cx to Prox Cx lesion, 50% stenosed.  First Obtuse Marginal Branch  There is mild disease in the vessel.   Second Obtuse Marginal Branch  The vessel exhibits minimal luminal irregularities.  Right Coronary Artery  Prox RCA to Mid RCA lesion, 100% stenosed. The lesion is type C and thrombotic. The lesion is moderately calcified.  Angioplasty: Lesion crossed with guidewire. Pre-stent angioplasty was performed. A drug eluting stent was successfully placed. Post-stent angioplasty was performed. There is no pre-interventional antegrade distal flow (TIMI 0). The post-interventional distal flow is normal (TIMI 3). The intervention was successful . No complications occurred at this lesion.  There is no residual stenosis post intervention.  Mid RCA lesion, 50% stenosed.  Dist RCA lesion, 30% stenosed.  Coronary Diagrams   Diagnostic Diagram       Post-Intervention Diagram         TTE 06/02/2017: Study Conclusions  - Left ventricle: The cavity size was normal. There was mild   concentric hypertrophy. Systolic function was normal. The   estimated ejection fraction was in the range of 55% to 60%. Mild   hypokinesis of the basal-midinferior myocardium. Doppler   parameters are consistent with abnormal left ventricular   relaxation (grade 1 diastolic dysfunction). - Aortic valve: There was moderate to severe stenosis. Mean   gradient (S): 21 mm Hg. Valve area (VTI): 0.99 cm^2. - Mitral valve: Calcified annulus. There was mild regurgitation. - Left atrium: The atrium was mildly dilated.  Laboratory Data:  Chemistry Recent Labs Lab 06/01/17 2228 06/02/17 0615 06/05/17 0006  NA 138 136 137  K 3.8 4.3 4.6  CL 104 109 110  CO2 25 24 18*  GLUCOSE 162* 120* 199*  BUN 24* 22* 86*  CREATININE 1.25* 1.03* 1.26*  CALCIUM 9.6 8.8* 8.9  GFRNONAA 39* 50* 39*  GFRAA 45* 57* 45*  ANIONGAP 9 3* 9     Recent Labs Lab 06/01/17 2228 06/05/17 0006  PROT 8.0 6.6  ALBUMIN 4.2 3.4*  AST 21 26  ALT 14 14  ALKPHOS 61 41  BILITOT 1.1 1.5*   Hematology Recent Labs Lab 06/01/17 2228  06/02/17 0615 06/05/17 0006 06/05/17 0335 06/05/17 0733  WBC 10.7 9.8 17.3*  --   --   RBC 5.17 4.55 3.22*  --   --   HGB 15.5 13.8 9.9* 8.7* 7.9*  HCT 46.6 41.1 29.3* 26.3* 24.7*  MCV 90.2 90.4 91.0  --   --  MCH 29.9 30.3 30.9  --   --   MCHC 33.2 33.5 34.0  --   --   RDW 13.7 13.5 13.7  --   --   PLT 246 216 198  --   --    Cardiac Enzymes Recent Labs Lab 06/01/17 2228 06/02/17 0615 06/03/17 0837 06/05/17 0006  TROPONINI 0.03* 13.00* 8.00* 3.58*   No results for input(s): TROPIPOC in the last 168 hours.  BNP Recent Labs Lab 06/05/17 0130  BNP 379.0*    DDimer No results for input(s): DDIMER in the last 168 hours.  Radiology/Studies:  Dg Abd 1 View  Addendum Date: 06/02/2017   ADDENDUM REPORT: 06/02/2017 07:23 ADDENDUM: Aortic Atherosclerosis (ICD10-I70.0). Aortic aneurysm NOS (ICD10-I71.9). Electronically Signed   By: Bretta Bang III M.D.   On: 06/02/2017 07:23   Result Date: 06/02/2017 CLINICAL DATA:  Abdominal pain and distention EXAM: ABDOMEN - 1 VIEW COMPARISON:  None. FINDINGS: Stomach is mildly distended with air. There is no appreciable small or large bowel dilatation. No air-fluid levels. No free air evident. There is contrast in the urinary bladder. There is aortic atherosclerosis with apparent aneurysmal dilatation of the distal aorta measuring 4.8 cm by radiography. IMPRESSION: 1. Evidence of abdominal aortic aneurysm. This finding warrants ultrasound or CT to further characterize. 2. Stomach mildly distended with air. No bowel obstruction or free air evident. 3.  Contrast noted in urinary bladder. Electronically Signed: By: Bretta Bang III M.D. On: 06/02/2017 07:17   US Venous Img Lower Unilateral Right  Result Date: 06/04/2017 CLINICAL DATA:  Right lower extremity pain and edema for the past day. Evaluate for DVT. EXAM: RIGHT LOWER EXTREMITY VENOUS DOPPLER ULTRASOUND TECHNIQUE: Gray-scale sonography with graded compression, as well as color  Doppler and duplex ultrasound were performed to evaluate the lower extremity deep venous systems from the level of the common femoral vein and including the common femoral, femoral, profunda femoral, popliteal and calf veins including the posterior tibial, peroneal and gastrocnemius veins when visible. The superficial great saphenous vein was also interrogated. Spectral Doppler was utilized to evaluate flow at rest and with distal augmentation maneuvers in the common femoral, femoral and popliteal veins. COMPARISON:  None. FINDINGS: Contralateral Common Femoral Vein: Respiratory phasicity is normal and symmetric with the symptomatic side. No evidence of thrombus. Normal compressibility. Common Femoral Vein: No evidence of thrombus. Normal compressibility, respiratory phasicity and response to augmentation. Saphenofemoral Junction: No evidence of thrombus. Normal compressibility and flow on color Doppler imaging. Profunda Femoral Vein: No evidence of thrombus. Normal compressibility and flow on color Doppler imaging. Femoral Vein: No evidence of thrombus. Normal compressibility, respiratory phasicity and response to augmentation. Popliteal Vein: No evidence of thrombus. Normal compressibility, respiratory phasicity and response to augmentation. Calf Veins: No evidence of thrombus. Normal compressibility and flow on color Doppler imaging. Superficial Great Saphenous Vein: No evidence of thrombus. Normal compressibility. Venous Reflux:  None. Other Findings:  None. IMPRESSION: No evidence of DVT within the right lower extremity. Electronically Signed   By: Simonne Come M.D.   On: 06/04/2017 11:39   Dg Chest Portable 1 View  Result Date: 06/05/2017 CLINICAL DATA:  Acute onset of nausea, vomiting and diarrhea. Decreased O2 saturation. Initial encounter. EXAM: PORTABLE CHEST 1 VIEW COMPARISON:  Chest radiograph performed 06/02/2017 FINDINGS: The lungs are well-aerated. Minimal right midlung scarring is noted. There is  no evidence of pleural effusion or pneumothorax. The cardiomediastinal silhouette is within normal limits. No acute osseous abnormalities are seen. IMPRESSION: Minimal right  midlung scarring.  Lungs otherwise clear. Electronically Signed   By: Roanna Raider M.D.   On: 06/05/2017 00:27   Dg Chest Port 1 View  Result Date: 06/02/2017 CLINICAL DATA:  Status post cardiac catheterization with echocardiogram changes suggesting acute infarct EXAM: PORTABLE CHEST 1 VIEW COMPARISON:  None. FINDINGS: There is a degree of interstitial pulmonary edema. No airspace consolidation or volume loss. Heart is mildly enlarged with pulmonary venous hypertension. No adenopathy. No bone lesions. IMPRESSION: Slight cardiomegaly with pulmonary venous hypertension. There is interstitial edema, slightly more on the left than the right. No airspace consolidation. No evident adenopathy. Electronically Signed   By: Bretta Bang III M.D.   On: 06/02/2017 07:18    Assessment and Plan:   1. Recent inferior ST elevation MI: -No chest pain -Troponin is down trending, consistent with prior peak of 13 at time of STEMI -Given recently placed DES she will need to continue antiplatelet therapy  -She did receive ASA and Brilinta this morning  -We have contacted interventional cardiology who recommends changing Brilinta to Plavix and discontinuation of ASA at this time. She will not get an evening dose of Brilinta and start Plavix on 10/26 per interventional cardiology   2. GI bleed/acute blood loss anemia: -Transfuse 2 units pRBC per IM -Check H/H between each transfusion  -GI to see for possible EGD as her bleed appear to be upper -Maintain HGB > 8.5  3. Aortic stenosis: -Echo showed moderate aortic stenosis with a mean gradient of 21 mmHg and a valve area of 0.99 cm^2 -Outpatient follow up   For questions or updates, please contact [facility] Please consult [email or web address] for contact info under Cardiology/STEMI.    Signed, Eula Listen, PA-C [facility] Pager: [phone] 06/05/2017, 8:02 AM

## 2017-06-05 NOTE — ED Notes (Addendum)
Date and time results received: 06/05/17 0127  Test: Lactic Acid  Critical Value: 2.0  Name of Provider Notified: Dr. Dolores FrameSung

## 2017-06-06 ENCOUNTER — Encounter: Payer: Self-pay | Admitting: Nurse Practitioner

## 2017-06-06 DIAGNOSIS — I2119 ST elevation (STEMI) myocardial infarction involving other coronary artery of inferior wall: Secondary | ICD-10-CM

## 2017-06-06 LAB — BASIC METABOLIC PANEL
ANION GAP: 3 — AB (ref 5–15)
BUN: 39 mg/dL — ABNORMAL HIGH (ref 6–20)
CHLORIDE: 115 mmol/L — AB (ref 101–111)
CO2: 22 mmol/L (ref 22–32)
Calcium: 8 mg/dL — ABNORMAL LOW (ref 8.9–10.3)
Creatinine, Ser: 0.92 mg/dL (ref 0.44–1.00)
GFR calc non Af Amer: 57 mL/min — ABNORMAL LOW (ref >60)
Glucose, Bld: 87 mg/dL (ref 65–99)
Potassium: 3.9 mmol/L (ref 3.5–5.1)
SODIUM: 140 mmol/L (ref 135–145)

## 2017-06-06 LAB — TYPE AND SCREEN
ABO/RH(D): A POS
ANTIBODY SCREEN: NEGATIVE
UNIT DIVISION: 0
Unit division: 0

## 2017-06-06 LAB — BPAM RBC
BLOOD PRODUCT EXPIRATION DATE: 201811162359
Blood Product Expiration Date: 201811162359
ISSUE DATE / TIME: 201810252204
ISSUE DATE / TIME: 201810251614
UNIT TYPE AND RH: 6200
Unit Type and Rh: 6200

## 2017-06-06 LAB — CBC
HCT: 26.3 % — ABNORMAL LOW (ref 35.0–47.0)
HEMOGLOBIN: 9 g/dL — AB (ref 12.0–16.0)
MCH: 30.4 pg (ref 26.0–34.0)
MCHC: 34.2 g/dL (ref 32.0–36.0)
MCV: 88.8 fL (ref 80.0–100.0)
Platelets: 154 10*3/uL (ref 150–440)
RBC: 2.96 MIL/uL — AB (ref 3.80–5.20)
RDW: 14.2 % (ref 11.5–14.5)
WBC: 12 10*3/uL — AB (ref 3.6–11.0)

## 2017-06-06 MED ORDER — FUROSEMIDE 10 MG/ML IJ SOLN
20.0000 mg | Freq: Once | INTRAMUSCULAR | Status: AC
  Administered 2017-06-06: 20 mg via INTRAVENOUS
  Filled 2017-06-06: qty 2

## 2017-06-06 NOTE — Progress Notes (Signed)
Monica Miniumarren Daila Elbert, MD [facility]   [address] [place], [place] [address] Phone: [phone] Fax : [phone]   Subjective: Patient has had no further GI bleeding overnight and her hemoglobin is stable at 9.0 after 2 units of blood.  The patient's hemoglobin yesterday was 7.1.  The patient denies any abdominal pain nausea vomiting fevers or chills.   Objective: Vital signs in last 24 hours: Vitals:   06/05/17 2235 06/06/17 0052 06/06/17 0447 06/06/17 0741  BP: (!) 92/49 (!) 97/42 119/63 (!) 110/56  Pulse: 94 96 94 92  Resp: 18  18 18   Temp: 98 F (36.7 C) 98.2 F (36.8 C) 98.6 F (37 C) 97.7 F (36.5 C)  TempSrc: Oral Oral Oral Oral  SpO2: 100% 99% 100% 97%  Weight:      Height:       Weight change:   Intake/Output Summary (Last 24 hours) at 06/06/17 1131 Last data filed at 06/06/17 1026  Gross per 24 hour  Intake             2090 ml  Output             1400 ml  Net              690 ml     Exam: Heart:: Regular rate and rhythm, S1S2 present or without murmur or extra heart sounds Lungs: normal and clear to auscultation and percussion Abdomen: soft, nontender, normal bowel sounds   Lab Results: @LABTEST2 @ Micro Results: Recent Results (from the past 240 hour(s))  MRSA PCR Screening     Status: None   Collection Time: 06/02/17  1:24 AM  Result Value Ref Range Status   MRSA by PCR NEGATIVE NEGATIVE Final    Comment:        The GeneXpert MRSA Assay (FDA approved for NASAL specimens only), is one component of a comprehensive MRSA colonization surveillance program. It is not intended to diagnose MRSA infection nor to guide or monitor treatment for MRSA infections.   Culture, blood (routine x 2)     Status: None (Preliminary result)   Collection Time: 06/05/17 12:41 AM  Result Value Ref Range Status   Specimen Description BLOOD LT HAND  Final   Special Requests   Final    BOTTLES DRAWN AEROBIC AND ANAEROBIC Blood Culture adequate volume   Culture NO  GROWTH 1 DAY  Final   Report Status PENDING  Incomplete  Culture, blood (routine x 2)     Status: None (Preliminary result)   Collection Time: 06/05/17 12:41 AM  Result Value Ref Range Status   Specimen Description BLOOD RT HAND  Final   Special Requests   Final    BOTTLES DRAWN AEROBIC AND ANAEROBIC Blood Culture adequate volume   Culture NO GROWTH 1 DAY  Final   Report Status PENDING  Incomplete   Studies/Results: Dg Chest Portable 1 View  Result Date: 06/05/2017 CLINICAL DATA:  Acute onset of nausea, vomiting and diarrhea. Decreased O2 saturation. Initial encounter. EXAM: PORTABLE CHEST 1 VIEW COMPARISON:  Chest radiograph performed 06/02/2017 FINDINGS: The lungs are well-aerated. Minimal right midlung scarring is noted. There is no evidence of pleural effusion or pneumothorax. The cardiomediastinal silhouette is within normal limits. No acute osseous abnormalities are seen. IMPRESSION: Minimal right midlung scarring.  Lungs otherwise clear. Electronically Signed   By: Roanna RaiderJeffery  Chang M.D.   On: 06/05/2017 00:27   Medications: I have reviewed the patient's current medications. Scheduled Meds: . atorvastatin  80 mg  Oral q1800  . carvedilol  3.125 mg Oral BID WC  . clopidogrel  75 mg Oral Daily   Continuous Infusions: . pantoprozole (PROTONIX) infusion 8 mg/hr (06/06/17 0721)   PRN Meds:.acetaminophen **OR** acetaminophen, albuterol, HYDROcodone-acetaminophen, nitroGLYCERIN, ondansetron **OR** ondansetron (ZOFRAN) IV   Assessment: Active Problems:   GI bleed   Nausea vomiting and diarrhea    Plan: This patient is status post a STEMI with an elevated troponin and a stent placement.  The patient subsequently had a upper GI bleed with melanotic stools and a drop in her hemoglobin from her baseline of 13.8 down to 7.1.  The patient's hemoglobin is 9.0 today after 2 units of blood.  The patient does not appear to have any further GI bleeding at this time.  I would continue monitoring  her hemoglobin and only recommend an endoscopic procedure if the patient should acutely bleed.   LOS: 1 day   Monica Minium 06/06/2017, 11:31 AM

## 2017-06-06 NOTE — Care Management Important Message (Signed)
Important Message  Patient Details  Name: Monica [name] MRN: [id number] Date of Birth: Nov 08, 1933   Medicare Important Message Given:  Yes Signed IM notice given    Eber HongGreene, Teniya Filter R, RN 06/06/2017, 3:45 PM

## 2017-06-06 NOTE — Telephone Encounter (Signed)
Patient contacted regarding discharge from [facility] 06/04/17 on 06/04/17.  Patient was readmitted but her son understands to follow up with provider Dr. Kirke CorinArida on 06/23/17 at 2:40 PM at [facility].  Patients son states that she had to be taken back to hospital due to bleeding. So she is still admitted. Reviewed appointment information and advised him to have her call back if they should have any questions regarding discharge instructions or medications.

## 2017-06-06 NOTE — Progress Notes (Signed)
[facility] - [place] at [facility]   PATIENT NAME: Monica [name]    MR#:  [id number]  DATE OF BIRTH:  [birth year]  SUBJECTIVE:   No acute bleeding overnight, hemoglobin stable.  Patient denies any chest pains, shortness of breath or abdominal pain.  REVIEW OF SYSTEMS:    Review of Systems  Constitutional: Negative for chills and fever.  HENT: Negative for congestion and tinnitus.   Eyes: Negative for blurred vision and double vision.  Respiratory: Negative for cough, shortness of breath and wheezing.   Cardiovascular: Negative for chest pain, orthopnea and PND.  Gastrointestinal: Negative for abdominal pain, diarrhea, nausea and vomiting.  Genitourinary: Negative for dysuria and hematuria.  Neurological: Positive for weakness. Negative for dizziness, sensory change and focal weakness.  All other systems reviewed and are negative.   Nutrition: Full liquids Tolerating Diet: yes Tolerating PT: Ambulatory  DRUG ALLERGIES:  No Known Allergies  VITALS:  Blood pressure (!) 99/44, pulse 81, temperature 97.6 F (36.4 C), temperature source Oral, resp. rate 18, height 5\' 3"  (1.6 m), weight 107 kg (235 lb 14.4 oz), SpO2 94 %.  PHYSICAL EXAMINATION:   Physical Exam  GENERAL:  83 y.o.-year-old patient lying in bed in no acute distress.  EYES: Pupils equal, round, reactive to light and accommodation. No scleral icterus. Extraocular muscles intact.  HEENT: Head atraumatic, normocephalic. Oropharynx and nasopharynx clear.  NECK:  Supple, no jugular venous distention. No thyroid enlargement, no tenderness.  LUNGS: Normal breath sounds bilaterally, no wheezing, rales, rhonchi. No use of accessory muscles of respiration.  CARDIOVASCULAR: S1, S2 normal. III/VI SEM at RSB, No rubs, or gallops.  ABDOMEN: Soft, nontender, nondistended. Bowel sounds present. No organomegaly or mass.  EXTREMITIES: No cyanosis, clubbing or edema b/l.    NEUROLOGIC: Cranial nerves II through XII  are intact. No focal Motor or sensory deficits b/l. Globally weak PSYCHIATRIC: The patient is alert and oriented x 3.  SKIN: No obvious rash, lesion, or ulcer.    LABORATORY PANEL:   CBC  Recent Labs Lab 06/06/17 0202  WBC 12.0*  HGB 9.0*  HCT 26.3*  PLT 154   ------------------------------------------------------------------------------------------------------------------  Chemistries   Recent Labs Lab 06/02/17 0615 06/05/17 0006  06/06/17 0202  NA 136 137  < > 140  K 4.3 4.6  < > 3.9  CL 109 110  < > 115*  CO2 24 18*  < > 22  GLUCOSE 120* 199*  < > 87  BUN 22* 86*  < > 39*  CREATININE 1.03* 1.26*  < > 0.92  CALCIUM 8.8* 8.9  < > 8.0*  MG 2.0  --   --   --   AST  --  26  --   --   ALT  --  14  --   --   ALKPHOS  --  41  --   --   BILITOT  --  1.5*  --   --   < > = values in this interval not displayed. ------------------------------------------------------------------------------------------------------------------  Cardiac Enzymes  Recent Labs Lab 06/05/17 1332  TROPONINI 2.47*   ------------------------------------------------------------------------------------------------------------------  RADIOLOGY:  Dg Chest Portable 1 View  Result Date: 06/05/2017 CLINICAL DATA:  Acute onset of nausea, vomiting and diarrhea. Decreased O2 saturation. Initial encounter. EXAM: PORTABLE CHEST 1 VIEW COMPARISON:  Chest radiograph performed 06/02/2017 FINDINGS: The lungs are well-aerated. Minimal right midlung scarring is noted. There is no evidence of pleural effusion or pneumothorax. The cardiomediastinal silhouette is within normal limits. No acute osseous  abnormalities are seen. IMPRESSION: Minimal right midlung scarring.  Lungs otherwise clear. Electronically Signed   By: Roanna RaiderJeffery  Chang M.D.   On: 06/05/2017 00:27     ASSESSMENT AND PLAN:   83 year old female with past medical history of hypertension, hyperlipidemia, aortic stenosis, coronary artery disease status  post cardiac catheterization and stent to the RCA who was just discharged to the hospital yesterday and returns back due to coffee-ground emesis, melanotic stools.  1. GI bleed-this is an acute upper GI bleed given the patient's melena and coffee-ground emesis. -Patient was started on dual antiplatelet therapy due to her cardiac catheterization and non-ST elevation MI. Anticoagulation presently cannot be stopped. -Continue Protonix drip, seen by gastroenterology no plans for intervention given acute cardiac event recently.  Hemoglobin remained stable posttransfusion.  Will advance diet from clear to full liquids. - If hemoglobin remains stable tomorrow patient can likely be discharged with outpatient follow-up.drip.  2. Acute blood loss anemia-secondary to the GI bleed. -Patient was transfused 2 units of packed red blood cells yesterday and hemoglobin has improved posttransfusion at 9 and remained stable.  Will follow hemoglobin in a.m.  3. Recent non-ST elevation MI status post drug-eluting stent placement-currently not having any chest pain or shortness of breath. -Continue aspirin, Plavix, carvedilol, atorvastatin.  4. Essential hypertension-continue carvedilol.  5. Hyperlipidemia continue atorvastatin.  Likely discharge home tomorrow if hemoglobin remains stable and no other acute cardiac symptoms.  All the records are reviewed and case discussed with Care Management/Social Worker. Management plans discussed with the patient, family and they are in agreement.  CODE STATUS: Full code  DVT Prophylaxis: Ted's & SCD's  TOTAL TIME TAKING CARE OF THIS PATIENT: 30 minutes.   POSSIBLE D/C IN 1-2 DAYS, DEPENDING ON CLINICAL CONDITION.   [place] SirenSAINANI,Leannah Guse J M.D on 06/06/2017 at 3:51 PM  Between 7am to 6pm - Pager - [phone]  After 6pm go to [email or web address] - Social research officer, governmentpassword [email or web address]  [facility]  [facility]  Office  [phone]  CC: Primary care physician; Patient, No Pcp  Per

## 2017-06-06 NOTE — Progress Notes (Signed)
Progress Note  Patient Name: Monica [name] Date of Encounter: 06/06/2017  Primary Cardiologist: Judie Petit. Kirke Corin, MD   Subjective   No chest pain or shortness of breath overnight.  H&H stable following transfusion.  Inpatient Medications    Scheduled Meds: . atorvastatin  80 mg Oral q1800  . carvedilol  3.125 mg Oral BID WC  . clopidogrel  75 mg Oral Daily   Continuous Infusions: . pantoprozole (PROTONIX) infusion 8 mg/hr (06/05/17 2000)   PRN Meds: acetaminophen **OR** acetaminophen, albuterol, HYDROcodone-acetaminophen, nitroGLYCERIN, ondansetron **OR** ondansetron (ZOFRAN) IV   Vital Signs    Vitals:   06/05/17 2204 06/05/17 2235 06/06/17 0052 06/06/17 0447  BP: (!) 99/46 (!) 92/49 (!) 97/42 119/63  Pulse: 95 94 96 94  Resp: 18 18  18   Temp: 98.2 F (36.8 C) 98 F (36.7 C) 98.2 F (36.8 C) 98.6 F (37 C)  TempSrc: Oral Oral Oral Oral  SpO2: 100% 100% 99% 100%  Weight:      Height:        Intake/Output Summary (Last 24 hours) at 06/06/17 0614 Last data filed at 06/06/17 0447  Gross per 24 hour  Intake             1390 ml  Output              950 ml  Net              440 ml   Filed Weights   06/05/17 0008 06/05/17 0454  Weight: 200 lb (90.7 kg) 235 lb 14.4 oz (107 kg)    Physical Exam   GEN: Well nourished, well developed, in no acute distress.  HEENT: Grossly normal.  Neck: Supple, no JVD, carotid bruits, or masses. Cardiac: RRR, 3/6 systolic ejection murmur at the upper sternal borders but heard throughout.  No rubs, or gallops. No clubbing, cyanosis, significant right anterior lower leg ecchymosis and swelling with tenderness.  No edema otherwise.  Radials/DP/PT 2+ and equal bilaterally.  Respiratory:  Respirations regular and unlabored,  bibasilar crackles. GI: Soft, obese, nontender, nondistended, BS + x 4. MS: no deformity or atrophy. Skin: warm and dry, no rash. Neuro:  Strength and sensation are intact. Psych: AAOx3.  Normal affect.  Labs      Chemistry Recent Labs Lab 06/01/17 2228  06/05/17 0006 06/05/17 0733 06/06/17 0202  NA 138  < > 137 139 140  K 3.8  < > 4.6 4.1 3.9  CL 104  < > 110 113* 115*  CO2 25  < > 18* 20* 22  GLUCOSE 162*  < > 199* 113* 87  BUN 24*  < > 86* 71* 39*  CREATININE 1.25*  < > 1.26* 1.11* 0.92  CALCIUM 9.6  < > 8.9 8.2* 8.0*  PROT 8.0  --  6.6  --   --   ALBUMIN 4.2  --  3.4*  --   --   AST 21  --  26  --   --   ALT 14  --  14  --   --   ALKPHOS 61  --  41  --   --   BILITOT 1.1  --  1.5*  --   --   GFRNONAA 39*  < > 39* 45* 57*  GFRAA 45*  < > 45* 53* >60  ANIONGAP 9  < > 9 6 3*  < > = values in this interval not displayed.   Hematology Recent Labs Lab 06/02/17 0615 06/05/17 0006  06/05/17 1332 06/05/17 2100 06/06/17 0202  WBC 9.8 17.3*  --   --   --  12.0*  RBC 4.55 3.22*  --   --   --  2.96*  HGB 13.8 9.9*  < > 7.1* 8.4* 9.0*  HCT 41.1 29.3*  < > 22.2* 25.6* 26.3*  MCV 90.4 91.0  --   --   --  88.8  MCH 30.3 30.9  --   --   --  30.4  MCHC 33.5 34.0  --   --   --  34.2  RDW 13.5 13.7  --   --   --  14.2  PLT 216 198  --   --   --  154  < > = values in this interval not displayed.  Cardiac Enzymes Recent Labs Lab 06/03/17 0837 06/05/17 0006 06/05/17 0733 06/05/17 1332  TROPONINI 8.00* 3.58* 2.97* 2.47*      BNP Recent Labs Lab 06/05/17 0130  BNP 379.0*      Radiology    US Venous Img Lower Unilateral Right  Result Date: 06/04/2017 CLINICAL DATA:  Right lower extremity pain and edema for the past day. Evaluate for DVT. EXAM: RIGHT LOWER EXTREMITY VENOUS DOPPLER ULTRASOUND TECHNIQUE: Gray-scale sonography with graded compression, as well as color Doppler and duplex ultrasound were performed to evaluate the lower extremity deep venous systems from the level of the common femoral vein and including the common femoral, femoral, profunda femoral, popliteal and calf veins including the posterior tibial, peroneal and gastrocnemius veins when visible. The superficial  great saphenous vein was also interrogated. Spectral Doppler was utilized to evaluate flow at rest and with distal augmentation maneuvers in the common femoral, femoral and popliteal veins. COMPARISON:  None. FINDINGS: Contralateral Common Femoral Vein: Respiratory phasicity is normal and symmetric with the symptomatic side. No evidence of thrombus. Normal compressibility. Common Femoral Vein: No evidence of thrombus. Normal compressibility, respiratory phasicity and response to augmentation. Saphenofemoral Junction: No evidence of thrombus. Normal compressibility and flow on color Doppler imaging. Profunda Femoral Vein: No evidence of thrombus. Normal compressibility and flow on color Doppler imaging. Femoral Vein: No evidence of thrombus. Normal compressibility, respiratory phasicity and response to augmentation. Popliteal Vein: No evidence of thrombus. Normal compressibility, respiratory phasicity and response to augmentation. Calf Veins: No evidence of thrombus. Normal compressibility and flow on color Doppler imaging. Superficial Great Saphenous Vein: No evidence of thrombus. Normal compressibility. Venous Reflux:  None. Other Findings:  None. IMPRESSION: No evidence of DVT within the right lower extremity. Electronically Signed   By: Simonne Come M.D.   On: 06/04/2017 11:39   Dg Chest Portable 1 View  Result Date: 06/05/2017 CLINICAL DATA:  Acute onset of nausea, vomiting and diarrhea. Decreased O2 saturation. Initial encounter. EXAM: PORTABLE CHEST 1 VIEW COMPARISON:  Chest radiograph performed 06/02/2017 FINDINGS: The lungs are well-aerated. Minimal right midlung scarring is noted. There is no evidence of pleural effusion or pneumothorax. The cardiomediastinal silhouette is within normal limits. No acute osseous abnormalities are seen. IMPRESSION: Minimal right midlung scarring.  Lungs otherwise clear. Electronically Signed   By: Roanna Raider M.D.   On: 06/05/2017 00:27    Telemetry    Sinus  rhythm with frequent PVCs, couplets, 3 beat nonsustained VT.- Personally Reviewed  Cardiac Studies   TTE 06/02/2017: Study Conclusions  - Left ventricle: The cavity size was normal. There was mild concentric hypertrophy. Systolic function was normal. The estimated ejection fraction was in the range of 55% to 60%. Mild  hypokinesis of the basal-midinferior myocardium. Doppler parameters are consistent with abnormal left ventricular relaxation (grade 1 diastolic dysfunction). - Aortic valve: There was moderate to severe stenosis. Mean gradient (S): 21 mm Hg. Valve area (VTI): 0.99 cm^2. - Mitral valve: Calcified annulus. There was mild regurgitation. - Left atrium: The atrium was mildly dilated.  Patient Profile     83 y.o. female s/p recent inferior STEMI w/ PCI/DES to the rCA, mod AS, HL, and tob abuse, who was admitted 10/25 w/ GIB.  Assessment & Plan    1.  GIB: Patient admitted with melena and coffee-ground emesis in the setting of recent inferior STEMI with aspirin and Brilinta therapy.  H&H 7.1 and 22.2 on admission.  Now status post 2 units of packed red blood cells and stable this morning at 9.0 and 26.3.  Brilinta switch to Plavix.  Aspirin discontinued.  Seen by GI with plan for initial conservative therapy in the setting of recent STEMI.  She is on PPI infusion.  EGD reserved for recurrent bleeding.  2.  Coronary artery disease/inferior ST elevation MI, subsequent episode of care:  2.  Inferior STEMI, subsequent episode of care/CAD: Trop trending down from last admission.  Status post PCI drug-eluting stent placement to the occluded right coronary artery on October 22.  No chest pain this admission.  Troponin trending down from last admission.  As above, Brilinta  switched to Plavix.  Aspirin on hold.  Continue high potency statin and beta-blocker therapy.  3.  HL: LDL 158 on October 21.  Continue high potency statin therapy with plan for outpatient  follow-up.  4.  Moderate to severe AS: Noted on recent echo.  Plan on surveillance echoes as an outpatient with consideration for therapy in the future.  5.  Mild volume overload: Patient with bibasilar crackles and ongoing supplemental oxygen requirement.  I will give low-dose IV Lasix x1 given transfusions yesterday.  Signed, Nicolasa Duckinghristopher Rickard Kennerly, NP  06/06/2017, 6:14 AM    For questions or updates, please contact   Please consult [email or web address] for contact info under Cardiology/STEMI.

## 2017-06-07 LAB — CBC
HEMATOCRIT: 26.5 % — AB (ref 35.0–47.0)
HEMOGLOBIN: 9.1 g/dL — AB (ref 12.0–16.0)
MCH: 31 pg (ref 26.0–34.0)
MCHC: 34.4 g/dL (ref 32.0–36.0)
MCV: 90 fL (ref 80.0–100.0)
PLATELETS: 172 10*3/uL (ref 150–440)
RBC: 2.94 MIL/uL — AB (ref 3.80–5.20)
RDW: 14.1 % (ref 11.5–14.5)
WBC: 10.5 10*3/uL (ref 3.6–11.0)

## 2017-06-07 MED ORDER — PANTOPRAZOLE SODIUM 40 MG PO TBEC: 40.0000 mg | DELAYED_RELEASE_TABLET | Freq: Two times a day (BID) | ORAL | 0 refills | Status: DC

## 2017-06-07 MED ORDER — CLOPIDOGREL BISULFATE 75 MG PO TABS: 75.0000 mg | ORAL_TABLET | Freq: Every day | ORAL | 0 refills | Status: DC

## 2017-06-07 NOTE — Plan of Care (Signed)
Problem: Education: Goal: Knowledge of [facility] Education information/materials will improve Outcome: Completed/Met Date Met: 06/07/17 Discharge instructions: medication changes, Plavis, discharge instructions, follow up.

## 2017-06-07 NOTE — Discharge Summary (Signed)
[employer] - [place] at [facility]   PATIENT NAME: Monica [name]    MR#:  [id number]  DATE OF BIRTH:  [birth year]  DATE OF ADMISSION:  06/04/2017 ADMITTING PHYSICIAN: Ihor Austin, MD  DATE OF DISCHARGE: 06/07/2017  PRIMARY CARE PHYSICIAN: Patient, No Pcp Per    ADMISSION DIAGNOSIS:  Dehydration [E86.0] Nausea vomiting and diarrhea [R11.2, R19.7] Gastrointestinal hemorrhage, unspecified gastrointestinal hemorrhage type [K92.2] Leukocytosis, unspecified type [D72.829] GI bleed [K92.2]  DISCHARGE DIAGNOSIS:  Active Problems:   GI bleed   Nausea vomiting and diarrhea   SECONDARY DIAGNOSIS:   Past Medical History:  Diagnosis Date  . Aortic stenosis    a. LHC 06/02/17: At least moderate aortic stenosis with a peak to peak gradient of 22 mmHg; b. TTE 05/2017: EF 55-60%, mild HK basal-midinferior wall, Gr1DD, mod to sev AS w/ mean gradient 21 mmHg, valve area 0.99, mild MR, mildly dilated LA   . CAD (coronary artery disease) 2018   a. inferior STEMI 06/02/2017: LHC 06/02/17: LM 20, D1 20%, o-pLCx 50, p-mRCA 100% s/p PCI/DES, mRCA 50, dRCA 30  . GI bleed    a. noted 06/05/2017  . HLD (hyperlipidemia)   . Obesity     HOSPITAL COURSE:  83 year old female with past medical history of hypertension, hyperlipidemia, aortic stenosis, coronary artery disease status post cardiac catheterization and stent to the RCA who was just discharged home for 1 day  and returns back due to coffee-ground emesis, melanotic stools.  1.  Acute upper GI bleed with coffee-ground emesis and melena: Patient was seen and evaluated by gastroenterology.  No plans for intervention given recent acute cardiac event.  Her hemoglobin has remained stable posttransfusion. She does need dual antiplatelet therapy due to her PCI.   2.  Acute blood loss anemia: Patient has been transfused 2 units of PRBCs.  Her hemoglobin has remained stable.  3.  Recent inferior ST elevation MI status post DES PCI  to occluded right coronary artery on October 22. Patient is being discharged without chest pain. She has been switched from Brilinta to Plavix due to GI bleed.  She will continue on low-dose aspirin.  Her conditions by cardiology are dual antiplatelet therapy.  She will have follow-up with cardiology within 1 week.  4.  Hyperlipidemia: LDL 158.  She will continue high potency statin.  5.  Moderate to severe left ear: Patient will have close follow-up with cardiology   6.  Essential hypertension: Continue Coreg DISCHARGE CONDITIONS AND DIET:   Stable for discharge on cardiac diet  CONSULTS OBTAINED:  Treatment Team:  Antonieta Iba, MD Midge Minium, MD  DRUG ALLERGIES:  No Known Allergies  DISCHARGE MEDICATIONS:   Current Discharge Medication List    START taking these medications   Details  clopidogrel (PLAVIX) 75 MG tablet Take 1 tablet (75 mg total) by mouth daily. Qty: 30 tablet, Refills: 0    pantoprazole (PROTONIX) 40 MG tablet Take 1 tablet (40 mg total) by mouth 2 (two) times daily. Qty: 60 tablet, Refills: 0      CONTINUE these medications which have NOT CHANGED   Details  aspirin 81 MG chewable tablet Chew 1 tablet (81 mg total) by mouth daily. Qty: 30 tablet, Refills: 2    atorvastatin (LIPITOR) 80 MG tablet Take 1 tablet (80 mg total) by mouth daily at 6 PM. Qty: 30 tablet, Refills: 2    carvedilol (COREG) 3.125 MG tablet Take 1 tablet (3.125 mg total) by mouth 2 (two) times daily  with a meal. Qty: 60 tablet, Refills: 2    nitroGLYCERIN (NITROSTAT) 0.4 MG SL tablet Place 1 tablet (0.4 mg total) under the tongue every 5 (five) minutes as needed for chest pain. Qty: 30 tablet, Refills: 2      STOP taking these medications     ticagrelor (BRILINTA) 90 MG TABS tablet           Today   CHIEF COMPLAINT:  Patient is doing well this morning.  She has no chest pain or shortness of breath.  She wants to go home.  She does not want home health care  intervention.   VITAL SIGNS:  Blood pressure (!) 103/57, pulse 95, temperature 98.5 F (36.9 C), temperature source Oral, resp. rate 18, height 5\' 3"  (1.6 m), weight 108.6 kg (239 lb 6.4 oz), SpO2 98 %.   REVIEW OF SYSTEMS:  Review of Systems  Constitutional: Negative.  Negative for chills, fever and malaise/fatigue.  HENT: Negative.  Negative for ear discharge, ear pain, hearing loss, nosebleeds and sore throat.   Eyes: Negative.  Negative for blurred vision and pain.  Respiratory: Positive for cough. Negative for hemoptysis, shortness of breath and wheezing.   Cardiovascular: Negative.  Negative for chest pain, palpitations and leg swelling.  Gastrointestinal: Negative.  Negative for abdominal pain, blood in stool, diarrhea, nausea and vomiting.  Genitourinary: Negative.  Negative for dysuria.  Musculoskeletal: Negative.  Negative for back pain.  Skin: Negative.   Neurological: Negative for dizziness, tremors, speech change, focal weakness, seizures and headaches.  Endo/Heme/Allergies: Negative.  Does not bruise/bleed easily.  Psychiatric/Behavioral: Negative.  Negative for depression, hallucinations and suicidal ideas.     PHYSICAL EXAMINATION:  GENERAL:  83 y.o.-year-old patient lying in the bed with no acute distress.  NECK:  Supple, no jugular venous distention. No thyroid enlargement, no tenderness.  LUNGS: Normal breath sounds bilaterally, no wheezing, rales,rhonchi  No use of accessory muscles of respiration.  CARDIOVASCULAR: S1, S2 normal. 3/6 SEM NOrubs, or gallops.  ABDOMEN: Soft, non-tender, non-distended. Bowel sounds present. No organomegaly or mass.  EXTREMITIES: No pedal edema, cyanosis, or clubbing.  PSYCHIATRIC: The patient is alert and oriented x 3.  SKIN: No obvious rash, lesion, or ulcer.   DATA REVIEW:   CBC  Recent Labs Lab 06/07/17 0549  WBC 10.5  HGB 9.1*  HCT 26.5*  PLT 172    Chemistries   Recent Labs Lab 06/02/17 0615 06/05/17 0006   06/06/17 0202  NA 136 137  < > 140  K 4.3 4.6  < > 3.9  CL 109 110  < > 115*  CO2 24 18*  < > 22  GLUCOSE 120* 199*  < > 87  BUN 22* 86*  < > 39*  CREATININE 1.03* 1.26*  < > 0.92  CALCIUM 8.8* 8.9  < > 8.0*  MG 2.0  --   --   --   AST  --  26  --   --   ALT  --  14  --   --   ALKPHOS  --  41  --   --   BILITOT  --  1.5*  --   --   < > = values in this interval not displayed.  Cardiac Enzymes  Recent Labs Lab 06/05/17 0006 06/05/17 0733 06/05/17 1332  TROPONINI 3.58* 2.97* 2.47*    Microbiology Results  @MICRORSLT48 @  RADIOLOGY:  No results found.    Current Discharge Medication List    START taking these  medications   Details  clopidogrel (PLAVIX) 75 MG tablet Take 1 tablet (75 mg total) by mouth daily. Qty: 30 tablet, Refills: 0    pantoprazole (PROTONIX) 40 MG tablet Take 1 tablet (40 mg total) by mouth 2 (two) times daily. Qty: 60 tablet, Refills: 0      CONTINUE these medications which have NOT CHANGED   Details  aspirin 81 MG chewable tablet Chew 1 tablet (81 mg total) by mouth daily. Qty: 30 tablet, Refills: 2    atorvastatin (LIPITOR) 80 MG tablet Take 1 tablet (80 mg total) by mouth daily at 6 PM. Qty: 30 tablet, Refills: 2    carvedilol (COREG) 3.125 MG tablet Take 1 tablet (3.125 mg total) by mouth 2 (two) times daily with a meal. Qty: 60 tablet, Refills: 2    nitroGLYCERIN (NITROSTAT) 0.4 MG SL tablet Place 1 tablet (0.4 mg total) under the tongue every 5 (five) minutes as needed for chest pain. Qty: 30 tablet, Refills: 2      STOP taking these medications     ticagrelor (BRILINTA) 90 MG TABS tablet          Aspirin prescribed at discharge?  Yes High Intensity Statin Prescribed? (Lipitor 40-80mg  or Crestor 20-40mg ): Yes Beta Blocker Prescribed? Yes For EF <40%, was ACEI/ARB Prescribed? Yes ADP Receptor Inhibitor Prescribed? (i.e. Plavix etc.-Includes Medically Managed Patients): : yes   Management plans discussed with the patient  and she is in agreement. Stable for discharge   Patient should follow up with cardiology  CODE STATUS:     Code Status Orders        Start     Ordered   06/05/17 0522  Full code  Continuous     06/05/17 0521    Code Status History    Date Active Date Inactive Code Status Order ID Comments User Context   06/02/2017 12:59 AM 06/04/2017  7:32 PM Full Code [id number]  Iran Ouch, MD Inpatient      TOTAL TIME TAKING CARE OF THIS PATIENT: 37 minutes.    Note: This dictation was prepared with Dragon dictation along with smaller phrase technology. Any transcriptional errors that result from this process are unintentional.  Elizibeth Breau M.D on 06/07/2017 at 8:15 AM  Between 7am to 6pm - Pager - [phone] After 6pm go to [email or web address] - password [employer]  [facility] [place] [facility]  Office  [phone]  CC: Primary care physician; Patient, No Pcp Per

## 2017-06-07 NOTE — Care Management Note (Addendum)
Case Management Note  Patient Details  Name: Monica ArmourSylvia [name] MRN: [id number] Date of Birth: [birth year]  Subjective/Objective:     Call to Dr Juliene PinaMody to request orders for HH=RN, SW [facility]. Monica Monica GreaserBoswell [facility] during her last hospital admission but [facility] was not able to see her prior to her being readmitted. [facility] now has to start over and needs [name] new MD home health order for RN and SW and [name] new face-to-face.             Action/Plan:   Expected Discharge Date:  06/07/17               Expected Discharge Plan:  Home w Home Health Services  In-House Referral:  NA  Discharge planning Services  CM Consult  Post Acute Care Choice:  Home Health, Durable Medical Equipment Choice offered to:  Patient  DME Arranged:  Dan HumphreysWalker DME Agency:  [facility]  HH Arranged:  RN, Social Work HH [facility]  [facility]  Status of Service:  Completed, signed off  If discussed at [employer] Length of Stay Meetings, dates discussed:    Additional Comments:  Monica Berrett A, RN 06/07/2017, 9:32 AM

## 2017-06-10 LAB — CULTURE, BLOOD (ROUTINE X 2)
CULTURE: NO GROWTH
Culture: NO GROWTH
SPECIAL REQUESTS: ADEQUATE
SPECIAL REQUESTS: ADEQUATE

## 2017-06-10 NOTE — Care Management (Signed)
Informed by [facility] that patient would not allow agency to visit and initiate services.

## 2017-06-23 ENCOUNTER — Ambulatory Visit (INDEPENDENT_AMBULATORY_CARE_PROVIDER_SITE_OTHER): Payer: Medicare Other | Admitting: Cardiovascular Disease

## 2017-06-23 ENCOUNTER — Encounter: Payer: Self-pay | Admitting: Cardiovascular Disease

## 2017-06-23 VITALS — BP 134/62 | HR 89 | Ht 64.0 in | Wt 239.8 lb

## 2017-06-23 DIAGNOSIS — I10 Essential (primary) hypertension: Secondary | ICD-10-CM

## 2017-06-23 DIAGNOSIS — I2510 Atherosclerotic heart disease of native coronary artery without angina pectoris: Secondary | ICD-10-CM

## 2017-06-23 DIAGNOSIS — E7800 Pure hypercholesterolemia, unspecified: Secondary | ICD-10-CM | POA: Diagnosis not present

## 2017-06-23 DIAGNOSIS — I350 Nonrheumatic aortic (valve) stenosis: Secondary | ICD-10-CM

## 2017-06-23 DIAGNOSIS — Z7689 Persons encountering health services in other specified circumstances: Secondary | ICD-10-CM

## 2017-06-23 MED ORDER — ROSUVASTATIN CALCIUM 10 MG PO TABS: 10.0000 mg | ORAL_TABLET | Freq: Every day | ORAL | 3 refills | Status: DC

## 2017-06-23 NOTE — Progress Notes (Signed)
Cardiology Office Note   Date:  06/23/2017   ID:  Monica [name], DOB Apr 15, 1934, MRN [id number]  PCP:  Patient, No Pcp Per  Cardiologist:   Lorine BearsMuhammad Mala Gibbard, MD   Chief Complaint  Patient presents with  . other    [facility]; s/p cardiac cath & stent placement. Meds reviewed by the pt. verbally. Pt. c/o LE edema as well as blurred vision.       History of Present Illness: Monica ArmourSylvia Graw is a 83 y.o. female who presents for a follow-up visit after recent hospitalization for inferior ST elevation myocardial infarction followed by another hospitalization for upper GI bleed. The patient has prolonged history of tobacco use and presented last month with acute inferior ST elevation myocardial infarction.  This was complicated by hypotension and bradycardia.  She underwent successful angioplasty and drug-eluting stent placement to the proximal right coronary artery.  She was noted to have moderate aortic stenosis.  Echo showed an EF of 50-60%.  Aortic valve mean gradient was 21 mmHg with a valve area of 0.99. She was discharged home but returned one day after with upper GI bleed with a hemoglobin of 9.9 which decreased to 7.9.  She underwent blood transfusion.  Brilinta was switched to Plavix.  The patient was seen by GI who felt that she was too high risk for endoscopic procedures. She has been doing reasonably well with no recent chest pain and no further bleeding.  She did develop bilateral leg edema worse on the right side.  Lower extremity venous duplex showed no evidence of DVT.  She reports diarrhea and GI symptoms atorvastatin and stopped taking the medication.  Past Medical History:  Diagnosis Date  . Aortic stenosis    a. LHC 06/02/17: At least moderate aortic stenosis with a peak to peak gradient of 22 mmHg; b. TTE 05/2017: EF 55-60%, mild HK basal-midinferior wall, Gr1DD, mod to sev AS w/ mean gradient 21 mmHg, valve area 0.99, mild MR, mildly dilated LA   . CAD (coronary  artery disease) 2018   a. inferior STEMI 06/02/2017: LHC 06/02/17: LM 20, D1 20%, o-pLCx 50, p-mRCA 100% s/p PCI/DES, mRCA 50, dRCA 30  . GI bleed    a. noted 06/05/2017  . HLD (hyperlipidemia)   . Obesity     Past Surgical History:  Procedure Laterality Date  . ANKLE RECONSTRUCTION  1956   also ORIF of right arm     Current Outpatient Medications  Medication Sig Dispense Refill  . aspirin 81 MG chewable tablet Chew 1 tablet (81 mg total) by mouth daily. 30 tablet 2  . carvedilol (COREG) 3.125 MG tablet Take 1 tablet (3.125 mg total) by mouth 2 (two) times daily with a meal. 60 tablet 2  . clopidogrel (PLAVIX) 75 MG tablet Take 1 tablet (75 mg total) by mouth daily. 30 tablet 0  . nitroGLYCERIN (NITROSTAT) 0.4 MG SL tablet Place 1 tablet (0.4 mg total) under the tongue every 5 (five) minutes as needed for chest pain. 30 tablet 2  . pantoprazole (PROTONIX) 40 MG tablet Take 1 tablet (40 mg total) by mouth 2 (two) times daily. 60 tablet 0  . rosuvastatin (CRESTOR) 10 MG tablet Take 1 tablet (10 mg total) daily by mouth. 90 tablet 3   No current facility-administered medications for this visit.     Allergies:   Patient has no known allergies.    Social History:  The patient  reports that she quit smoking about 3 weeks  ago. Her smoking use included cigarettes. She has a 20.00 pack-year smoking history. she has never used smokeless tobacco. She reports that she does not drink alcohol or use drugs.   Family History:  The patient's family history includes Diabetes Mellitus II in her daughter; Valvular heart disease in her mother.    ROS:  Please see the history of present illness.   Otherwise, review of systems are positive for none.   All other systems are reviewed and negative.    PHYSICAL EXAM: VS:  BP 134/62 (BP Location: Left Arm, Patient Position: Sitting, Cuff Size: Large)   Pulse 89   Ht 5\' 4"  (1.626 m)   Wt 239 lb 12 oz (108.7 kg)   BMI 41.15 kg/m  , BMI Body mass index  is 41.15 kg/m. GEN: Well nourished, well developed, in no acute distress  HEENT: normal  Neck: no JVD, carotid bruits, or masses Cardiac: RRR; no  rubs, or gallops moderate leg edema worse on the right side.  3 out of 6 crescendo decrescendo systolic murmur in the aortic area which is late peaking Respiratory:  clear to auscultation bilaterally, normal work of breathing GI: soft, nontender, nondistended, + BS MS: no deformity or atrophy  Skin: warm and dry, no rash Neuro:  Strength and sensation are intact Psych: euthymic mood, full affect   EKG:  EKG is ordered today. The ekg ordered today demonstrates normal sinus rhythm with first-degree AV block with a PVC and left atrial enlargement.   Recent Labs: 06/02/2017: Magnesium 2.0 06/05/2017: ALT 14; B Natriuretic Peptide 379.0 06/06/2017: BUN 39; Creatinine, Ser 0.92; Potassium 3.9; Sodium 140 06/07/2017: Hemoglobin 9.1; Platelets 172    Lipid Panel    Component Value Date/Time   CHOL 240 (H) 06/01/2017 2228   TRIG 148 06/01/2017 2228   HDL 52 06/01/2017 2228   CHOLHDL 4.6 06/01/2017 2228   VLDL 30 06/01/2017 2228   LDLCALC 158 (H) 06/01/2017 2228      Wt Readings from Last 3 Encounters:  06/23/17 239 lb 12 oz (108.7 kg)  06/07/17 239 lb 6.4 oz (108.6 kg)  06/01/17 200 lb (90.7 kg)      No flowsheet data found.    ASSESSMENT AND PLAN:  1.  Coronary artery disease involving native coronary arteries without angina: The patient had inferior ST elevation myocardial infarction last month.  Brilinta was switched to Plavix due to GI bleed.  She seems to be tolerating aspirin and Plavix at the present time.  I recommend dual antiplatelet therapy for at least one year. I advised her to attend cardiac rehab but she is not able to do so.  2.  Moderate aortic stenosis: By physical exam, the stenosis might be severe.  I recommend a repeat echocardiogram in 6 months from now.  3.  Hyperlipidemia: She did not tolerate high-dose  atorvastatin due to GI symptoms.  I elected to start on rosuvastatin 10 mg daily.  Check lipid and liver profile in 1 month.  4.  Essential hypertension: Blood pressure is controlled on lisinopril.  5.  Recent GI bleed: Continue treatment with Protonix as long as she is on dual antiplatelet therapy.  I strongly advised her not to use any nonsteroidal anti-inflammatory medications.  6.  Previous tobacco use: She has not smoked since her myocardial infarction.   Disposition:   FU with me in 3 months  Signed,  Lorine BearsMuhammad Eyvette Cordon, MD  06/23/2017 4:24 PM    [facility]

## 2017-06-23 NOTE — Patient Instructions (Addendum)
Medication Instructions:  Your physician has recommended you make the following change in your medication:  STOP taking lipitor START taking rosuvastatin 10mg  once daily   Labwork: [facility]. No appt needed. Nothing to eat or drink after midnight the evening before your labs.   Testing/Procedures: none  Follow-Up: Your physician recommends that you schedule a follow-up appointment in: 3 months with Dr. Kirke CorinArida.    Any Other Special Instructions Will Be Listed Below (If Applicable).     If you need a refill on your cardiac medications before your next appointment, please call your pharmacy.  Lipid Profile Test Why am I having this test? The lipid profile test gives results that can help predict the likelihood of developing heart disease. The test is also used to monitor treatment for high cholesterol to see if you are reaching your goals. A lipid profile measures the following:  Total cholesterol. Cholesterol is a waxy fat in your blood. If your total cholesterol is elevated, this can increase your risk of coronary heart disease.  High-density lipoprotein (HDL). This is known as the good cholesterol. Having a high level of HDL is good. Your HDL level may be low if you smoke or do not get enough exercise.  Low-density lipoprotein (LDL). This is known as the bad cholesterol and is responsible for the formation of plaque in the arteries. Having a low level of LDL is best.  Cholesterol to HDL ratio. This is calculated by dividing the total cholesterol by the HDL cholesterol. The ratio is used by health care providers for determining your risk of heart disease. A low ratio is best.  Triglycerides. These are a type of fat in the blood responsible for providing energy to your cells. Low levels are best.  What kind of sample is taken? A blood sample is required for this test. It is usually collected by inserting a needle into a  vein. How do I prepare for this test? Do not eat or drink anything after midnight on the night before the test or as directed by your health care provider. What are the reference ranges? Reference ranges are considered healthy ranges established after testing a large group of healthy people. Reference ranges may vary among different people, labs, and hospitals. It is your responsibility to obtain your test results. Ask the lab or department performing the test when and how you will get your results. Reference ranges for the lipid profile test are as follows: Total Cholesterol  Adult or elderly: less than 200 mg/dL or less than 7.825.20 mmol/L (SI units).  Child: 120-200 mg/dL.  Infant: 70-175 mg/dL.  Newborns: 53-135 mg/dL. HDL  Female: greater than 45 mg/dL or greater than 9.560.75 mmol/L (SI units).  Female: greater than 55 mg/dL or greater than 2.130.91 mmol/L (SI units). HDL reference values based on risk of heart disease:  For low risk of heart disease: ? Female: 60 mg/dL or 0.861.55 mmol/L. ? Female: 70 mg/dL or 5.781.81 mmol/L.  For moderate risk of heart disease: ? Female: 45 mg/dL or 4.691.17 mmol/L. ? Female: 55 mg/dL or 6.291.42 mmol/L.  For high risk of heart disease: ? Female: 25 mg/dL or 5.280.65 mmol/L. ? Female: 35 mg/dL or 4.130.90 mmol/L.  LDL  Adult: less than 130 mg/dL.  Children: less than 110 mg/dL. Cholesterol to HDL Ratio Reference values based on risk for coronary heart disease:  Risk that is one half average: ? Female: 3.4. ? Female: 3.3.  Average risk: ? Female: 5.0. ? Female: 4.4.  Risk that is two times average (moderate risk): ? Female: 10.0. ? Female: 7.0.  Risk that is three times average (high risk): ? Female: 24.0. ? Female: 11.0.  Triglycerides  Adult or elderly: ? Female: 40-160 mg/dL or 1.61-0.960.45-1.81 mmol/L (SI units). ? Female: 35-135 mg/dL or 0.45-4.090.40-1.52 mmol/L (SI units).  Children 200-81 years old: ? Female: 30-86 mg/dL. ? Female: 32-99 mg/dL.  Children 6-11 years  old: ? Female: 31-108 mg/dL. ? Female: 35-114 mg/dL.  Children [id number] years old: ? Female: 36-138 mg/dL. ? Female: 41-138 mg/dL.  Children 1316-81 years old: ? Female: 40-163 mg/dL. ? Female: 40-128 mg/dL. Triglycerides should be less than 400 mg/dL even when you are not fasting. What do the results mean? Talk with your health care provider to discuss your results, treatment options, and if necessary, the need for more tests. Talk with your health care provider if you have any questions about your results. Talk with your health care provider to discuss your results, treatment options, and if necessary, the need for more tests. Talk with your health care provider if you have any questions about your results. This information is not intended to replace advice given to you by your health care provider. Make sure you discuss any questions you have with your health care provider. Document Released: 08/22/2004 Document Revised: 04/03/2016 Document Reviewed: 11/18/2013 Elsevier Interactive Patient Education  2018 [employer]  Cholesterol Cholesterol is a white, waxy, fat-like substance that is needed by the human body in small amounts. The liver makes all the cholesterol we need. Cholesterol is carried from the liver by the blood through the blood vessels. Deposits of cholesterol (plaques) may build up on blood vessel (artery) walls. Plaques make the arteries narrower and stiffer. Cholesterol plaques increase the risk for heart attack and stroke. You cannot feel your cholesterol level even if it is very high. The only way to know that it is high is to have a blood test. Once you know your cholesterol levels, you should keep a record of the test results. Work with your health care provider to keep your levels in the desired range. What do the results mean?  Total cholesterol is a rough measure of all the cholesterol in your blood.  LDL (low-density lipoprotein) is the "bad" cholesterol. This is the type  that causes plaque to build up on the artery walls. You want this level to be low.  HDL (high-density lipoprotein) is the "good" cholesterol because it cleans the arteries and carries the LDL away. You want this level to be high.  Triglycerides are fat that the body can either burn for energy or store. High levels are closely linked to heart disease. What are the desired levels of cholesterol?  Total cholesterol below 200.  LDL below 100 for people who are at risk, below 70 for people at very high risk.  HDL above 40 is good. A level of 60 or higher is considered to be protective against heart disease.  Triglycerides below 150. How can I lower my cholesterol? Diet Follow your diet program as told by your health care provider.  Choose fish or white meat chicken and [place], roasted or baked. Limit fatty cuts of red meat, fried foods, and processed meats, such as sausage and lunch meats.  Eat lots of fresh fruits and vegetables.  Choose whole grains, beans, pasta, potatoes, and cereals.  Choose olive oil, corn oil, or canola oil, and use only small amounts.  Avoid butter, mayonnaise, shortening, or palm kernel oils.  Avoid foods with trans fats.  Drink skim or nonfat milk and eat low-fat or nonfat yogurt and cheeses. Avoid whole milk, cream, ice cream, egg yolks, and full-fat cheeses.  Healthier desserts include angel food cake, ginger snaps, animal crackers, hard candy, popsicles, and low-fat or nonfat frozen yogurt. Avoid pastries, cakes, pies, and cookies.  Exercise  Follow your exercise program as told by your health care provider. A regular program: ? Helps to decrease LDL and raise HDL. ? Helps with weight control.  Do things that increase your activity level, such as gardening, walking, and taking the stairs.  Ask your health care provider about ways that you can be more active in your daily life.  Medicine  Take over-the-counter and prescription medicines only as  told by your health care provider. ? Medicine may be prescribed by your health care provider to help lower cholesterol and decrease the risk for heart disease. This is usually done if diet and exercise have failed to bring down cholesterol levels. ? If you have several risk factors, you may need medicine even if your levels are normal.  This information is not intended to replace advice given to you by your health care provider. Make sure you discuss any questions you have with your health care provider. Document Released: 04/23/2001 Document Revised: 02/24/2016 Document Reviewed: 01/27/2016 Elsevier Interactive Patient Education  2017 [employer].

## 2017-07-07 ENCOUNTER — Other Ambulatory Visit: Payer: Self-pay

## 2017-07-07 MED ORDER — CLOPIDOGREL BISULFATE 75 MG PO TABS: 75.0000 mg | ORAL_TABLET | Freq: Every day | ORAL | 0 refills | Status: DC

## 2017-07-07 MED ORDER — PANTOPRAZOLE SODIUM 40 MG PO TBEC: 40.0000 mg | DELAYED_RELEASE_TABLET | Freq: Two times a day (BID) | ORAL | 0 refills | Status: DC

## 2017-09-11 ENCOUNTER — Other Ambulatory Visit: Payer: Self-pay

## 2017-09-11 ENCOUNTER — Telehealth: Payer: Self-pay | Admitting: Cardiovascular Disease

## 2017-09-11 MED ORDER — CARVEDILOL 3.125 MG PO TABS: 3.1250 mg | ORAL_TABLET | Freq: Two times a day (BID) | ORAL | 0 refills | Status: DC

## 2017-09-11 MED ORDER — ASPIRIN 81 MG PO CHEW: 81.0000 mg | CHEWABLE_TABLET | Freq: Every day | ORAL | 0 refills | Status: DC

## 2017-09-11 NOTE — Telephone Encounter (Signed)
Requested Prescriptions   Signed Prescriptions Disp Refills  . aspirin 81 MG chewable tablet 90 tablet 0    Sig: Chew 1 tablet (81 mg total) by mouth daily.    Authorizing Provider: Lorine BearsARIDA, MUHAMMAD A    Ordering User: Margrett RudSLAYTON, Kagen Kunath N  . carvedilol (COREG) 3.125 MG tablet 180 tablet 0    Sig: Take 1 tablet (3.125 mg total) by mouth 2 (two) times daily with a meal.    Authorizing Provider: Lorine BearsARIDA, MUHAMMAD A    Ordering User: Margrett RudSLAYTON, Sharman Garrott N

## 2017-09-11 NOTE — Telephone Encounter (Signed)
°*  STAT* If patient is at the pharmacy, call can be transferred to refill team.   1. Which medications need to be refilled? (please list name of each medication and dose if known)  Coreg Baby aspirin   2. Which pharmacy/location (including street and city if local pharmacy) is medication to be sent to? [facility] on [address]  3. Do they need a 30 day or 90 day supply? 90 day

## 2017-09-11 NOTE — Telephone Encounter (Signed)
Requested Prescriptions   Signed Prescriptions Disp Refills  . aspirin 81 MG chewable tablet 90 tablet 0    Sig: Chew 1 tablet (81 mg total) by mouth daily.    Authorizing Provider: ARIDA, MUHAMMAD A    Ordering User: Frankee Gritz N  . carvedilol (COREG) 3.125 MG tablet 180 tablet 0    Sig: Take 1 tablet (3.125 mg total) by mouth 2 (two) times daily with a meal.    Authorizing Provider: ARIDA, MUHAMMAD A    Ordering User: Nashonda Limberg N    

## 2017-09-23 ENCOUNTER — Encounter: Payer: Self-pay | Admitting: Cardiovascular Disease

## 2017-09-23 ENCOUNTER — Ambulatory Visit (INDEPENDENT_AMBULATORY_CARE_PROVIDER_SITE_OTHER): Payer: Medicare Other | Admitting: Cardiovascular Disease

## 2017-09-23 ENCOUNTER — Ambulatory Visit: Payer: Medicare Other | Admitting: Cardiovascular Disease

## 2017-09-23 VITALS — BP 142/78 | HR 80 | Ht 65.0 in | Wt 240.0 lb

## 2017-09-23 DIAGNOSIS — E785 Hyperlipidemia, unspecified: Secondary | ICD-10-CM | POA: Diagnosis not present

## 2017-09-23 DIAGNOSIS — I2510 Atherosclerotic heart disease of native coronary artery without angina pectoris: Secondary | ICD-10-CM | POA: Diagnosis not present

## 2017-09-23 DIAGNOSIS — I350 Nonrheumatic aortic (valve) stenosis: Secondary | ICD-10-CM | POA: Diagnosis not present

## 2017-09-23 DIAGNOSIS — I10 Essential (primary) hypertension: Secondary | ICD-10-CM | POA: Diagnosis not present

## 2017-09-23 MED ORDER — CLOPIDOGREL BISULFATE 75 MG PO TABS: 75.0000 mg | ORAL_TABLET | Freq: Every day | ORAL | 11 refills | Status: DC

## 2017-09-23 NOTE — Progress Notes (Signed)
Cardiology Office Note   Date:  09/23/2017   ID:  Monica [name], DOB 20-Mar-1934, MRN [id number]  PCP:  Patient, No Pcp Per  Cardiologist:   Lorine Bears, MD   Chief Complaint  Patient presents with  . Other    3 month follow up. Patient c/o SOB at times and pain in both legs. Meds reviewed verbally with patient.       History of Present Illness: Monica [name] is a 84 y.o. female who presents for a follow-up visit regarding coronary artery disease.  The patient has prolonged history of tobacco use and presented in October 2018 with acute inferior ST elevation myocardial infarction.  This was complicated by hypotension and bradycardia.  She underwent successful angioplasty and drug-eluting stent placement to the proximal right coronary artery.  She was noted to have moderate aortic stenosis.  Echo showed an EF of 50-60%.  Aortic valve mean gradient was 21 mmHg with a valve area of 0.99. She was discharged home but returned one day after with upper GI bleed with a hemoglobin of 9.9 which decreased to 7.9.  She underwent blood transfusion.  Brilinta was switched to Plavix.  The patient was seen by GI who felt that she was too high risk for endoscopic procedures.  She has been doing well over the last few months with no recent chest pain.  No bleeding episodes.  Continues to have mild exertional dyspnea.  She complains of left leg pain with walking and at rest.  She did fall last year.  Pulses are palpable in the left leg.  Quit smoking since her myocardial infarction.  Past Medical History:  Diagnosis Date  . Aortic stenosis    a. LHC 06/02/17: At least moderate aortic stenosis with a peak to peak gradient of 22 mmHg; b. TTE 05/2017: EF 55-60%, mild HK basal-midinferior wall, Gr1DD, mod to sev AS w/ mean gradient 21 mmHg, valve area 0.99, mild MR, mildly dilated LA   . CAD (coronary artery disease) 2018   a. inferior STEMI 06/02/2017: LHC 06/02/17: LM 20, D1 20%, o-pLCx 50, p-mRCA  100% s/p PCI/DES, mRCA 50, dRCA 30  . GI bleed    a. noted 06/05/2017  . HLD (hyperlipidemia)   . Obesity     Past Surgical History:  Procedure Laterality Date  . ANKLE RECONSTRUCTION  1956   also ORIF of right arm  . CORONARY STENT INTERVENTION N/A 06/01/2017   Procedure: Coronary/Graft Acute MI Revascularization;  Surgeon: Iran Ouch, MD;  Location: [facility] INVASIVE CV LAB;  Service: Cardiovascular;  Laterality: N/A;  . LEFT HEART CATH AND CORONARY ANGIOGRAPHY N/A 06/01/2017   Procedure: LEFT HEART CATH AND CORONARY ANGIOGRAPHY;  Surgeon: Iran Ouch, MD;  Location: [facility] INVASIVE CV LAB;  Service: Cardiovascular;  Laterality: N/A;     Current Outpatient Medications  Medication Sig Dispense Refill  . aspirin 81 MG chewable tablet Chew 1 tablet (81 mg total) by mouth daily. 90 tablet 0  . carvedilol (COREG) 3.125 MG tablet Take 1 tablet (3.125 mg total) by mouth 2 (two) times daily with a meal. 180 tablet 0  . clopidogrel (PLAVIX) 75 MG tablet Take 1 tablet (75 mg total) by mouth daily. 90 tablet 0  . nitroGLYCERIN (NITROSTAT) 0.4 MG SL tablet Place 1 tablet (0.4 mg total) under the tongue every 5 (five) minutes as needed for chest pain. 30 tablet 2  . pantoprazole (PROTONIX) 40 MG tablet Take 1 tablet (40 mg total) by mouth 2 (two)  times daily. 180 tablet 0  . rosuvastatin (CRESTOR) 10 MG tablet Take 1 tablet (10 mg total) daily by mouth. 90 tablet 3   No current facility-administered medications for this visit.     Allergies:   Patient has no known allergies.    Social History:  The patient  reports that she quit smoking about 3 months ago. Her smoking use included cigarettes. She has a 20.00 pack-year smoking history. she has never used smokeless tobacco. She reports that she does not drink alcohol or use drugs.   Family History:  The patient's family history includes Diabetes Mellitus II in her daughter; Valvular heart disease in her mother.    ROS:  Please see the  history of present illness.   Otherwise, review of systems are positive for none.   All other systems are reviewed and negative.    PHYSICAL EXAM: VS:  BP (!) 142/78 (BP Location: Right Arm, Patient Position: Sitting, Cuff Size: Large)   Pulse 80   Ht 5\' 5"  (1.651 m)   Wt 240 lb (108.9 kg)   BMI 39.94 kg/m  , BMI Body mass index is 39.94 kg/m. GEN: Well nourished, well developed, in no acute distress  HEENT: normal  Neck: no JVD, carotid bruits, or masses Cardiac: RRR; no  rubs, or gallops moderate leg edema worse on the right side.  3 out of 6 crescendo decrescendo systolic murmur in the aortic area which is late peaking Respiratory:  clear to auscultation bilaterally, normal work of breathing GI: soft, nontender, nondistended, + BS MS: no deformity or atrophy  Skin: warm and dry, no rash Neuro:  Strength and sensation are intact Psych: euthymic mood, full affect   EKG:  EKG is ordered today. The ekg ordered today demonstrates normal sinus rhythm with evidence of old inferior infarct.   Recent Labs: 06/02/2017: Magnesium 2.0 06/05/2017: ALT 14; B Natriuretic Peptide 379.0 06/06/2017: BUN 39; Creatinine, Ser 0.92; Potassium 3.9; Sodium 140 06/07/2017: Hemoglobin 9.1; Platelets 172    Lipid Panel    Component Value Date/Time   CHOL 240 (H) 06/01/2017 2228   TRIG 148 06/01/2017 2228   HDL 52 06/01/2017 2228   CHOLHDL 4.6 06/01/2017 2228   VLDL 30 06/01/2017 2228   LDLCALC 158 (H) 06/01/2017 2228      Wt Readings from Last 3 Encounters:  09/23/17 240 lb (108.9 kg)  06/23/17 239 lb 12 oz (108.7 kg)  06/07/17 239 lb 6.4 oz (108.6 kg)      No flowsheet data found.    ASSESSMENT AND PLAN:  1.  Coronary artery disease involving native coronary arteries without angina: She is doing very well overall with no anginal symptoms.  She is tolerating dual antiplatelet therapy.  Continue same medications.  2.  Moderate aortic stenosis: I requested a follow-up  echocardiogram to be done in 3 months from now  3.  Hyperlipidemia: She did not tolerate high-dose atorvastatin due to GI symptoms.   She is tolerating rosuvastatin.  We will get a follow-up lipid and liver profile in few months as she missed her appointment for labs.  4.  Essential hypertension: Blood pressure is controlled on lisinopril.  5.  Previous tobacco use: She has not smoked since her myocardial infarction.  6.  Left leg pain: Her pulses are palpable and symptoms do not seem to be due to peripheral arterial disease.  I again stressed the importance of not taking NSAIDs given that she is on dual antiplatelet therapy and had GI bleed  in October.  I strongly advised her to establish with a primary care physician for this.  Disposition:   FU with me in 3 months  Signed,  Lorine BearsMuhammad Arida, MD  09/23/2017 4:45 PM    [facility]

## 2017-09-23 NOTE — Patient Instructions (Addendum)
Medication Instructions:  Your physician recommends that you continue on your current medications as directed. Please refer to the Current Medication list given to you today.   Labwork: Fasting lipid and liver profile when you come in for your echo. Nothing to eat or drink after midnight the evening before your labs.   Testing/Procedures: Your physician has requested that you have an echocardiogram. Echocardiography is a painless test that uses sound waves to create images of your heart. It provides your doctor with information about the size and shape of your heart and how well your heart's chambers and valves are working. This procedure takes approximately one hour. There are no restrictions for this procedure. You may have this the same day as your labs and follow up appointment.     Follow-Up: Your physician recommends that you schedule a follow-up appointment in: 3 months with Dr. Kirke CorinArida.    Any Other Special Instructions Will Be Listed Below (If Applicable).     If you need a refill on your cardiac medications before your next appointment, please call your pharmacy.  Echocardiogram An echocardiogram, or echocardiography, uses sound waves (ultrasound) to produce an image of your heart. The echocardiogram is simple, painless, obtained within a short period of time, and offers valuable information to your health care provider. The images from an echocardiogram can provide information such as:  Evidence of coronary artery disease (CAD).  Heart size.  Heart muscle function.  Heart valve function.  Aneurysm detection.  Evidence of a past heart attack.  Fluid buildup around the heart.  Heart muscle thickening.  Assess heart valve function.  Tell a health care provider about:  Any allergies you have.  All medicines you are taking, including vitamins, herbs, eye drops, creams, and over-the-counter medicines.  Any problems you or family members have had with anesthetic  medicines.  Any blood disorders you have.  Any surgeries you have had.  Any medical conditions you have.  Whether you are pregnant or may be pregnant. What happens before the procedure? No special preparation is needed. Eat and drink normally. What happens during the procedure?  In order to produce an image of your heart, gel will be applied to your chest and a wand-like tool (transducer) will be moved over your chest. The gel will help transmit the sound waves from the transducer. The sound waves will harmlessly bounce off your heart to allow the heart images to be captured in real-time motion. These images will then be recorded.  You may need an IV to receive a medicine that improves the quality of the pictures. What happens after the procedure? You may return to your normal schedule including diet, activities, and medicines, unless your health care provider tells you otherwise. This information is not intended to replace advice given to you by your health care provider. Make sure you discuss any questions you have with your health care provider. Document Released: 07/26/2000 Document Revised: 03/16/2016 Document Reviewed: 04/05/2013 Elsevier Interactive Patient Education  2017 [employer]

## 2017-09-24 ENCOUNTER — Telehealth: Payer: Self-pay | Admitting: Cardiovascular Disease

## 2017-09-24 NOTE — Telephone Encounter (Signed)
Per patient AVS, please schedule her for fasting labs, echo and follow up all in three months. They may all be done on the same day.  Routed to support pool.

## 2017-09-25 NOTE — Telephone Encounter (Signed)
Entered 3 m fu recall unable to schedule labs in office without schedule for may  Will call patient when staffing schedule available

## 2017-11-26 ENCOUNTER — Other Ambulatory Visit: Payer: Self-pay | Admitting: Cardiovascular Disease

## 2017-12-18 ENCOUNTER — Other Ambulatory Visit: Payer: Medicare Other

## 2017-12-19 ENCOUNTER — Other Ambulatory Visit: Payer: Self-pay | Admitting: Cardiovascular Disease

## 2017-12-19 NOTE — Telephone Encounter (Signed)
Please advise if ok to refill. 

## 2017-12-19 NOTE — Telephone Encounter (Signed)
Ok to refill 

## 2017-12-23 ENCOUNTER — Ambulatory Visit (INDEPENDENT_AMBULATORY_CARE_PROVIDER_SITE_OTHER): Payer: Medicare Other

## 2017-12-23 ENCOUNTER — Other Ambulatory Visit: Payer: Self-pay

## 2017-12-23 ENCOUNTER — Other Ambulatory Visit
Admission: RE | Admit: 2017-12-23 | Discharge: 2017-12-23 | Disposition: A | Discharge status: Home / Self Care | Payer: Medicare Other | Source: Ambulatory Visit | Attending: Cardiovascular Disease | Admitting: Cardiovascular Disease

## 2017-12-23 DIAGNOSIS — E7800 Pure hypercholesterolemia, unspecified: Secondary | ICD-10-CM

## 2017-12-23 DIAGNOSIS — I350 Nonrheumatic aortic (valve) stenosis: Secondary | ICD-10-CM

## 2017-12-23 LAB — LIPID PANEL
CHOL/HDL RATIO: 2.5 ratio
CHOLESTEROL: 143 mg/dL (ref 0–200)
HDL: 58 mg/dL (ref >40)
LDL CALC: 66 mg/dL (ref 0–99)
TRIGLYCERIDES: 95 mg/dL (ref <150)
VLDL: 19 mg/dL (ref 0–40)

## 2017-12-23 LAB — HEPATIC FUNCTION PANEL
ALK PHOS: 59 U/L (ref 38–126)
ALT: 10 U/L — ABNORMAL LOW (ref 14–54)
AST: 20 U/L (ref 15–41)
Albumin: 4.1 g/dL (ref 3.5–5.0)
BILIRUBIN TOTAL: 1.9 mg/dL — AB (ref 0.3–1.2)
Bilirubin, Direct: 0.2 mg/dL (ref 0.1–0.5)
Indirect Bilirubin: 1.7 mg/dL — ABNORMAL HIGH (ref 0.3–0.9)
TOTAL PROTEIN: 7.9 g/dL (ref 6.5–8.1)

## 2017-12-26 ENCOUNTER — Other Ambulatory Visit: Payer: Self-pay | Admitting: *Deleted

## 2017-12-26 ENCOUNTER — Encounter: Payer: Self-pay | Admitting: Cardiovascular Disease

## 2017-12-26 ENCOUNTER — Ambulatory Visit (INDEPENDENT_AMBULATORY_CARE_PROVIDER_SITE_OTHER): Payer: Medicare Other | Admitting: Cardiovascular Disease

## 2017-12-26 VITALS — BP 138/74 | HR 75 | Ht 65.0 in | Wt 246.5 lb

## 2017-12-26 DIAGNOSIS — E785 Hyperlipidemia, unspecified: Secondary | ICD-10-CM

## 2017-12-26 DIAGNOSIS — I10 Essential (primary) hypertension: Secondary | ICD-10-CM

## 2017-12-26 DIAGNOSIS — I2510 Atherosclerotic heart disease of native coronary artery without angina pectoris: Secondary | ICD-10-CM | POA: Diagnosis not present

## 2017-12-26 DIAGNOSIS — I350 Nonrheumatic aortic (valve) stenosis: Secondary | ICD-10-CM

## 2017-12-26 MED ORDER — PANTOPRAZOLE SODIUM 40 MG PO TBEC: 40.0000 mg | DELAYED_RELEASE_TABLET | Freq: Every day | ORAL | 3 refills | Status: DC

## 2017-12-26 NOTE — Patient Instructions (Addendum)
Medication Instructions: Your physician has recommended you make the following change in your medication:  1. DECREASE Protonix to 40 mg once daily   Labwork: None.   Procedures/Testing: None.   Follow-Up: 6 months with Dr. Kirke Corin.   Any Additional Special Instructions Will Be Listed Below (If Applicable).     If you need a refill on your cardiac medications before your next appointment, please call your pharmacy.

## 2017-12-26 NOTE — Progress Notes (Signed)
Cardiology Office Note   Date:  12/26/2017   ID:  Monica [name], DOB July 18, 1934, MRN [id number]  PCP:  Patient, No Pcp Per  Cardiologist:   Lorine Bears, MD   Chief Complaint  Patient presents with  . OTHER    F/u echo c/o sob. Meds reviewed verbally with pt.      History of Present Illness: Monica [name] is a 84 y.o. female who presents for a follow-up visit regarding coronary artery disease.  The patient has prolonged history of tobacco use and presented in October 2018 with acute inferior ST elevation myocardial infarction.  This was complicated by hypotension and bradycardia.  She underwent successful angioplasty and drug-eluting stent placement to the proximal right coronary artery.  She was noted to have moderate aortic stenosis.  Echo showed an EF of 50-60%.  Aortic valve mean gradient was 21 mmHg with a valve area of 0.99. She was rehospitalized with upper GI bleed with a hemoglobin of 9.9 which decreased to 7.9.  She underwent blood transfusion.  Brilinta was switched to Plavix.  The patient was seen by GI who felt that she was too high risk for endoscopic procedures. Quit smoking since her myocardial infarction.  She has been doing well with no recent chest pain.  Shortness of breath is stable.  No further bleeding episodes.  Past Medical History:  Diagnosis Date  . Aortic stenosis    a. LHC 06/02/17: At least moderate aortic stenosis with a peak to peak gradient of 22 mmHg; b. TTE 05/2017: EF 55-60%, mild HK basal-midinferior wall, Gr1DD, mod to sev AS w/ mean gradient 21 mmHg, valve area 0.99, mild MR, mildly dilated LA   . CAD (coronary artery disease) 2018   a. inferior STEMI 06/02/2017: LHC 06/02/17: LM 20, D1 20%, o-pLCx 50, p-mRCA 100% s/p PCI/DES, mRCA 50, dRCA 30  . GI bleed    a. noted 06/05/2017  . HLD (hyperlipidemia)   . Obesity     Past Surgical History:  Procedure Laterality Date  . ANKLE RECONSTRUCTION  1956   also ORIF of right arm  .  CORONARY STENT INTERVENTION N/A 06/01/2017   Procedure: Coronary/Graft Acute MI Revascularization;  Surgeon: Iran Ouch, MD;  Location: [facility] INVASIVE CV LAB;  Service: Cardiovascular;  Laterality: N/A;  . LEFT HEART CATH AND CORONARY ANGIOGRAPHY N/A 06/01/2017   Procedure: LEFT HEART CATH AND CORONARY ANGIOGRAPHY;  Surgeon: Iran Ouch, MD;  Location: [facility] INVASIVE CV LAB;  Service: Cardiovascular;  Laterality: N/A;     Current Outpatient Medications  Medication Sig Dispense Refill  . carvedilol (COREG) 3.125 MG tablet take 1 tablet by mouth twice a day with food 180 tablet 0  . clopidogrel (PLAVIX) 75 MG tablet Take 1 tablet (75 mg total) by mouth daily. 90 tablet 11  . nitroGLYCERIN (NITROSTAT) 0.4 MG SL tablet Place 1 tablet (0.4 mg total) under the tongue every 5 (five) minutes as needed for chest pain. 30 tablet 2  . pantoprazole (PROTONIX) 40 MG tablet take 1 tablet by mouth twice a day 180 tablet 0  . RA ASPIRIN ADULT LOW STRENGTH 81 MG chewable tablet chew and swallow 1 tablet by mouth once daily 90 tablet 2  . rosuvastatin (CRESTOR) 10 MG tablet Take 1 tablet (10 mg total) daily by mouth. 90 tablet 3   No current facility-administered medications for this visit.     Allergies:   Patient has no known allergies.    Social History:  The patient  reports that she quit smoking about 6 months ago. Her smoking use included cigarettes. She has a 20.00 pack-year smoking history. She has never used smokeless tobacco. She reports that she does not drink alcohol or use drugs.   Family History:  The patient's family history includes Diabetes Mellitus II in her daughter; Valvular heart disease in her mother.    ROS:  Please see the history of present illness.   Otherwise, review of systems are positive for none.   All other systems are reviewed and negative.    PHYSICAL EXAM: VS:  BP 138/74 (BP Location: Left Arm, Patient Position: Sitting, Cuff Size: Large)   Pulse 75   Ht   (1.651 m)   Wt 246 lb 8 oz (111.8 kg)   SpO2 96%   BMI 41.02 kg/m  , BMI Body mass index is 41.02 kg/m. GEN: Well nourished, well developed, in no acute distress  HEENT: normal  Neck: no JVD, carotid bruits, or masses Cardiac: RRR; no  rubs, or gallops.  Mild bilateral leg edema.  3 out of 6 crescendo decrescendo systolic murmur in the aortic area which is late peaking Respiratory:  clear to auscultation bilaterally, normal work of breathing GI: soft, nontender, nondistended, + BS MS: no deformity or atrophy  Skin: warm and dry, no rash Neuro:  Strength and sensation are intact Psych: euthymic mood, full affect   EKG:  EKG is ordered today. The ekg ordered today demonstrates normal sinus rhythm with sinus arrhythmia.   Recent Labs: 06/02/2017: Magnesium 2.0 06/05/2017: B Natriuretic Peptide 379.0 06/06/2017: BUN 39; Creatinine, Ser 0.92; Potassium 3.9; Sodium 140 06/07/2017: Hemoglobin 9.1; Platelets 172 12/23/2017: ALT 10    Lipid Panel    Component Value Date/Time   CHOL 143 12/23/2017 0927   TRIG 95 12/23/2017 0927   HDL 58 12/23/2017 0927   CHOLHDL 2.5 12/23/2017 0927   VLDL 19 12/23/2017 0927   LDLCALC 66 12/23/2017 0927      Wt Readings from Last 3 Encounters:  12/26/17 246 lb 8 oz (111.8 kg)  09/23/17 240 lb (108.9 kg)  06/23/17 239 lb 12 oz (108.7 kg)      No flowsheet data found.    ASSESSMENT AND PLAN:  1.  Coronary artery disease involving native coronary arteries without angina: She is doing very well overall with no anginal symptoms.  She is tolerating dual antiplatelet therapy.  Continue same medications.  The plan is to discontinue clopidogrel in October or November.  2.  Moderate aortic stenosis: She had a repeat echocardiogram recently which showed mild progression with a mean gradient of 31 mmHg and valve area of 0.97.  I plan on repeat echocardiogram in 6 months and most likely she will require evaluation for TAVR.   3.   Hyperlipidemia: She did not tolerate high-dose atorvastatin due to GI symptoms.   She is tolerating rosuvastatin.   Lipid profile recently showed significant improvement in LDL down to 66.  4.  Essential hypertension: Blood pressure is controlled on carvedilol.   Disposition:   FU with me in 6 months  Signed,  Lorine Bears, MD  12/26/2017 2:37 PM    [facility]

## 2018-03-19 ENCOUNTER — Other Ambulatory Visit: Payer: Self-pay | Admitting: Cardiovascular Disease

## 2018-04-29 ENCOUNTER — Inpatient Hospital Stay
Admission: EM | Admit: 2018-04-29 | Discharge: 2018-05-02 | DRG: 603 | Disposition: A | Discharge status: Home / Self Care | Payer: Medicare Other | Attending: Internal Medicine | Admitting: Internal Medicine

## 2018-04-29 ENCOUNTER — Other Ambulatory Visit: Payer: Self-pay

## 2018-04-29 ENCOUNTER — Encounter: Payer: Self-pay | Admitting: Emergency Medicine

## 2018-04-29 ENCOUNTER — Emergency Department: Payer: Medicare Other

## 2018-04-29 ENCOUNTER — Inpatient Hospital Stay: Admit: 2018-04-29 | Payer: Medicare Other

## 2018-04-29 DIAGNOSIS — Z87891 Personal history of nicotine dependence: Secondary | ICD-10-CM

## 2018-04-29 DIAGNOSIS — E785 Hyperlipidemia, unspecified: Secondary | ICD-10-CM | POA: Diagnosis present

## 2018-04-29 DIAGNOSIS — K219 Gastro-esophageal reflux disease without esophagitis: Secondary | ICD-10-CM | POA: Diagnosis present

## 2018-04-29 DIAGNOSIS — X58XXXA Exposure to other specified factors, initial encounter: Secondary | ICD-10-CM | POA: Diagnosis present

## 2018-04-29 DIAGNOSIS — I248 Other forms of acute ischemic heart disease: Secondary | ICD-10-CM | POA: Diagnosis present

## 2018-04-29 DIAGNOSIS — L03116 Cellulitis of left lower limb: Secondary | ICD-10-CM | POA: Diagnosis not present

## 2018-04-29 DIAGNOSIS — I350 Nonrheumatic aortic (valve) stenosis: Secondary | ICD-10-CM | POA: Diagnosis present

## 2018-04-29 DIAGNOSIS — Z79899 Other long term (current) drug therapy: Secondary | ICD-10-CM

## 2018-04-29 DIAGNOSIS — Z7982 Long term (current) use of aspirin: Secondary | ICD-10-CM | POA: Diagnosis not present

## 2018-04-29 DIAGNOSIS — R5381 Other malaise: Secondary | ICD-10-CM | POA: Diagnosis present

## 2018-04-29 DIAGNOSIS — I2510 Atherosclerotic heart disease of native coronary artery without angina pectoris: Secondary | ICD-10-CM | POA: Diagnosis present

## 2018-04-29 DIAGNOSIS — I252 Old myocardial infarction: Secondary | ICD-10-CM | POA: Diagnosis not present

## 2018-04-29 DIAGNOSIS — Z955 Presence of coronary angioplasty implant and graft: Secondary | ICD-10-CM | POA: Diagnosis not present

## 2018-04-29 DIAGNOSIS — T148XXA Other injury of unspecified body region, initial encounter: Secondary | ICD-10-CM | POA: Diagnosis present

## 2018-04-29 DIAGNOSIS — I351 Nonrheumatic aortic (valve) insufficiency: Secondary | ICD-10-CM | POA: Diagnosis not present

## 2018-04-29 DIAGNOSIS — Z66 Do not resuscitate: Secondary | ICD-10-CM | POA: Diagnosis present

## 2018-04-29 DIAGNOSIS — I10 Essential (primary) hypertension: Secondary | ICD-10-CM | POA: Diagnosis present

## 2018-04-29 DIAGNOSIS — R531 Weakness: Secondary | ICD-10-CM | POA: Diagnosis present

## 2018-04-29 DIAGNOSIS — Y92013 Bedroom of single-family (private) house as the place of occurrence of the external cause: Secondary | ICD-10-CM

## 2018-04-29 DIAGNOSIS — W06XXXA Fall from bed, initial encounter: Secondary | ICD-10-CM | POA: Diagnosis present

## 2018-04-29 DIAGNOSIS — L0390 Cellulitis, unspecified: Secondary | ICD-10-CM

## 2018-04-29 LAB — TROPONIN I
TROPONIN I: 0.23 ng/mL — AB (ref <0.03)
TROPONIN I: 0.19 ng/mL — AB (ref <0.03)
Troponin I: 0.16 ng/mL (ref <0.03)

## 2018-04-29 LAB — CBC WITH DIFFERENTIAL/PLATELET
BASOS ABS: 0 10*3/uL (ref 0–0.1)
BASOS PCT: 0 %
EOS ABS: 0 10*3/uL (ref 0–0.7)
Eosinophils Relative: 0 %
HCT: 39.3 % (ref 35.0–47.0)
HEMOGLOBIN: 13 g/dL (ref 12.0–16.0)
Lymphocytes Relative: 12 %
Lymphs Abs: 2.1 10*3/uL (ref 1.0–3.6)
MCH: 29.1 pg (ref 26.0–34.0)
MCHC: 33.1 g/dL (ref 32.0–36.0)
MCV: 87.7 fL (ref 80.0–100.0)
MONOS PCT: 11 %
Monocytes Absolute: 2 10*3/uL — ABNORMAL HIGH (ref 0.2–0.9)
NEUTROS PCT: 77 %
Neutro Abs: 13.7 10*3/uL — ABNORMAL HIGH (ref 1.4–6.5)
Platelets: 227 10*3/uL (ref 150–440)
RBC: 4.48 MIL/uL (ref 3.80–5.20)
RDW: 15 % — ABNORMAL HIGH (ref 11.5–14.5)
WBC: 17.8 10*3/uL — ABNORMAL HIGH (ref 3.6–11.0)

## 2018-04-29 LAB — URINALYSIS, COMPLETE (UACMP) WITH MICROSCOPIC
BILIRUBIN URINE: NEGATIVE
Glucose, UA: NEGATIVE mg/dL
Ketones, ur: 5 mg/dL — AB
Leukocytes, UA: NEGATIVE
NITRITE: NEGATIVE
PH: 7 (ref 5.0–8.0)
Protein, ur: NEGATIVE mg/dL
SPECIFIC GRAVITY, URINE: 1.01 (ref 1.005–1.030)

## 2018-04-29 LAB — COMPREHENSIVE METABOLIC PANEL
ALK PHOS: 67 U/L (ref 38–126)
ALT: 13 U/L (ref 0–44)
ANION GAP: 10 (ref 5–15)
AST: 34 U/L (ref 15–41)
Albumin: 3.6 g/dL (ref 3.5–5.0)
BILIRUBIN TOTAL: 3.9 mg/dL — AB (ref 0.3–1.2)
BUN: 17 mg/dL (ref 8–23)
CO2: 25 mmol/L (ref 22–32)
Calcium: 8.7 mg/dL — ABNORMAL LOW (ref 8.9–10.3)
Chloride: 97 mmol/L — ABNORMAL LOW (ref 98–111)
Creatinine, Ser: 0.9 mg/dL (ref 0.44–1.00)
GFR calc Af Amer: >60 mL/min (ref >60)
GFR, EST NON AFRICAN AMERICAN: 58 mL/min — AB (ref >60)
GLUCOSE: 110 mg/dL — AB (ref 70–99)
Potassium: 3.7 mmol/L (ref 3.5–5.1)
Sodium: 132 mmol/L — ABNORMAL LOW (ref 135–145)
TOTAL PROTEIN: 7.8 g/dL (ref 6.5–8.1)

## 2018-04-29 LAB — CK: Total CK: 386 U/L — ABNORMAL HIGH (ref 38–234)

## 2018-04-29 MED ORDER — ONDANSETRON HCL 4 MG PO TABS: 4.0000 mg | ORAL_TABLET | Freq: Four times a day (QID) | ORAL | Status: DC | PRN

## 2018-04-29 MED ORDER — CARVEDILOL 3.125 MG PO TABS
3.1250 mg | ORAL_TABLET | Freq: Two times a day (BID) | ORAL | Status: DC
  Administered 2018-04-29 – 2018-05-02 (×6): 3.125 mg via ORAL
  Filled 2018-04-29 (×6): qty 1

## 2018-04-29 MED ORDER — INFLUENZA VAC SPLIT HIGH-DOSE 0.5 ML IM SUSY
0.5000 mL | PREFILLED_SYRINGE | INTRAMUSCULAR | Status: DC
  Filled 2018-04-29: qty 0.5

## 2018-04-29 MED ORDER — ENOXAPARIN SODIUM 40 MG/0.4ML ~~LOC~~ SOLN
40.0000 mg | Freq: Two times a day (BID) | SUBCUTANEOUS | Status: DC
  Administered 2018-04-29 – 2018-05-01 (×4): 40 mg via SUBCUTANEOUS
  Filled 2018-04-29 (×6): qty 0.4

## 2018-04-29 MED ORDER — ROSUVASTATIN CALCIUM 10 MG PO TABS
10.0000 mg | ORAL_TABLET | Freq: Every day | ORAL | Status: DC
  Administered 2018-04-29 – 2018-05-02 (×4): 10 mg via ORAL
  Filled 2018-04-29 (×4): qty 1

## 2018-04-29 MED ORDER — ACETAMINOPHEN 650 MG RE SUPP: 650.0000 mg | Freq: Four times a day (QID) | RECTAL | Status: DC | PRN

## 2018-04-29 MED ORDER — VANCOMYCIN HCL 10 G IV SOLR
1250.0000 mg | INTRAVENOUS | Status: DC
  Administered 2018-04-29 – 2018-05-02 (×4): 1250 mg via INTRAVENOUS
  Filled 2018-04-29 (×5): qty 1250

## 2018-04-29 MED ORDER — RAMELTEON 8 MG PO TABS
8.0000 mg | ORAL_TABLET | Freq: Every day | ORAL | Status: DC
  Administered 2018-04-29 – 2018-05-01 (×3): 8 mg via ORAL
  Filled 2018-04-29 (×4): qty 1

## 2018-04-29 MED ORDER — PANTOPRAZOLE SODIUM 40 MG PO TBEC
40.0000 mg | DELAYED_RELEASE_TABLET | Freq: Every day | ORAL | Status: DC
  Administered 2018-04-29 – 2018-05-02 (×4): 40 mg via ORAL
  Filled 2018-04-29 (×4): qty 1

## 2018-04-29 MED ORDER — ACETAMINOPHEN 325 MG PO TABS: 650.0000 mg | ORAL_TABLET | Freq: Four times a day (QID) | ORAL | Status: DC | PRN

## 2018-04-29 MED ORDER — ASPIRIN 81 MG PO CHEW
81.0000 mg | CHEWABLE_TABLET | Freq: Every day | ORAL | Status: DC
  Administered 2018-04-30 – 2018-05-02 (×3): 81 mg via ORAL
  Filled 2018-04-29 (×3): qty 1

## 2018-04-29 MED ORDER — ASPIRIN 81 MG PO CHEW
324.0000 mg | CHEWABLE_TABLET | Freq: Once | ORAL | Status: AC
  Administered 2018-04-29: 324 mg via ORAL
  Filled 2018-04-29: qty 4

## 2018-04-29 MED ORDER — PNEUMOCOCCAL VAC POLYVALENT 25 MCG/0.5ML IJ INJ
0.5000 mL | INJECTION | INTRAMUSCULAR | Status: DC
  Filled 2018-04-29: qty 0.5

## 2018-04-29 MED ORDER — OXYCODONE HCL 5 MG PO TABS
5.0000 mg | ORAL_TABLET | Freq: Four times a day (QID) | ORAL | Status: DC | PRN
  Administered 2018-04-29 – 2018-05-02 (×6): 5 mg via ORAL
  Filled 2018-04-29 (×6): qty 1

## 2018-04-29 MED ORDER — VANCOMYCIN HCL IN DEXTROSE 1-5 GM/200ML-% IV SOLN
1000.0000 mg | Freq: Once | INTRAVENOUS | Status: AC
  Administered 2018-04-29: 1000 mg via INTRAVENOUS
  Filled 2018-04-29: qty 200

## 2018-04-29 MED ORDER — NITROGLYCERIN 0.4 MG SL SUBL: 0.4000 mg | SUBLINGUAL_TABLET | SUBLINGUAL | Status: DC | PRN

## 2018-04-29 MED ORDER — SODIUM CHLORIDE 0.9 % IV SOLN
3.0000 g | Freq: Once | INTRAVENOUS | Status: AC
  Administered 2018-04-29: 3 g via INTRAVENOUS
  Filled 2018-04-29: qty 3

## 2018-04-29 MED ORDER — ONDANSETRON HCL 4 MG/2ML IJ SOLN: 4.0000 mg | Freq: Four times a day (QID) | INTRAMUSCULAR | Status: DC | PRN

## 2018-04-29 MED ORDER — CLOPIDOGREL BISULFATE 75 MG PO TABS
75.0000 mg | ORAL_TABLET | Freq: Every day | ORAL | Status: DC
  Administered 2018-04-29 – 2018-05-02 (×4): 75 mg via ORAL
  Filled 2018-04-29 (×4): qty 1

## 2018-04-29 NOTE — Progress Notes (Addendum)
Pharmacy Antibiotic Note  Monica [name] is a 84 y.o. female admitted on 04/29/2018 with cellulitis.  Pharmacy has been consulted for vancomycin dosing.  Plan: EDP ordered Unasyn. Admitting wants to continue vancomycin and stop Unasyn. Vancomycin 1 gm IV x 1 in ED followed in approximately 6 hours (stacked dosing) by vancomycin 1.25 gm IV Q18H, predicted trough 15 mcg/ml. Pharmacy will continue to follow and adjust as needed to maintain trough 10 to 15 mcg/ml.   Vd 53.5 L, Ke 0.053 hr-1, T1/2 13.2 hr  Height: 5\' 4"  (162.6 cm) Weight: 240 lb (108.9 kg) IBW/kg (Calculated) : 54.7  Temp (24hrs), Avg:98.6 F (37 C), Min:98.6 F (37 C), Max:98.6 F (37 C)  Recent Labs  Lab 04/29/18 1502  WBC 17.8*    CrCl cannot be calculated (Patient's most recent lab result is older than the maximum 21 days allowed.).    No Known Allergies  Antimicrobials this admission: Vancomycin 1 gm and Unasyn 3 gm in ED  Dose adjustments this admission:   Microbiology results:  BCx:   UCx:    Sputum:    MRSA PCR:   Thank you for allowing pharmacy to be a part of this patient's care.  Carola FrostNathan A Jadarion Halbig, Pharm.D., BCPS Clinical Pharmacist 04/29/2018 3:46 PM

## 2018-04-29 NOTE — ED Triage Notes (Signed)
Pt arrived via [facility] after sliding out of bed at approx 0900. Denies hitting head. - LOC. Family was not able to help her get off the floor. Pt c/o of lt leg pain, redness, and swelling for approx. 1 month

## 2018-04-29 NOTE — ED Notes (Signed)
Patient transported to Ultrasound 

## 2018-04-29 NOTE — ED Provider Notes (Addendum)
[facility] Emergency Department Provider Note   ____________________________________________   First MD Initiated Contact with Patient 04/29/18 1217     (approximate)  I have reviewed the triage vital signs and the nursing notes.   HISTORY  Chief Complaint Fall    HPI Monica [name] is a 84 y.o. female patient reports she slid out of bed last night and was too weak to get up off the floor.  Last night and yesterday she been having a lot of shaking chills.  Her left leg has been red and swollen for a month is been getting worse in the last 2 weeks.  She does not think she has had a fever but she is not sure.  Any chest pain shortness of breath or any other complaints.  She is not coughing or having a headache.  She did not hit her head when she slid out of bed. Patient had a cath about 6 months ago and has been doing well since then. Past Medical History:  Diagnosis Date  . Aortic stenosis    a. LHC 06/02/17: At least moderate aortic stenosis with a peak to peak gradient of 22 mmHg; b. TTE 05/2017: EF 55-60%, mild HK basal-midinferior wall, Gr1DD, mod to sev AS w/ mean gradient 21 mmHg, valve area 0.99, mild MR, mildly dilated LA   . CAD (coronary artery disease) 2018   a. inferior STEMI 06/02/2017: LHC 06/02/17: LM 20, D1 20%, o-pLCx 50, p-mRCA 100% s/p PCI/DES, mRCA 50, dRCA 30  . GI bleed    a. noted 06/05/2017  . HLD (hyperlipidemia)   . Obesity     Patient Active Problem List   Diagnosis Date Noted  . GI bleed 06/05/2017  . Nausea vomiting and diarrhea   . Acute ST elevation myocardial infarction (STEMI) of inferior wall (HCC) 06/02/2017    Past Surgical History:  Procedure Laterality Date  . ANKLE RECONSTRUCTION  1956   also ORIF of right arm  . CORONARY STENT INTERVENTION N/A 06/01/2017   Procedure: Coronary/Graft Acute MI Revascularization;  Surgeon: Iran OuchArida, Muhammad A, MD;  Location: [facility] INVASIVE CV LAB;  Service: Cardiovascular;   Laterality: N/A;  . LEFT HEART CATH AND CORONARY ANGIOGRAPHY N/A 06/01/2017   Procedure: LEFT HEART CATH AND CORONARY ANGIOGRAPHY;  Surgeon: Iran OuchArida, Muhammad A, MD;  Location: [facility] INVASIVE CV LAB;  Service: Cardiovascular;  Laterality: N/A;    Prior to Admission medications   Medication Sig Start Date End Date Taking? Authorizing Provider  carvedilol (COREG) 3.125 MG tablet take 1 tablet by mouth twice a day with food 03/19/18  Yes Iran OuchArida, Muhammad A, MD  clopidogrel (PLAVIX) 75 MG tablet Take 1 tablet (75 mg total) by mouth daily. 09/23/17  Yes Iran OuchArida, Muhammad A, MD  nitroGLYCERIN (NITROSTAT) 0.4 MG SL tablet Place 1 tablet (0.4 mg total) under the tongue every 5 (five) minutes as needed for chest pain. 06/04/17  Yes Shaune Pollackhen, Qing, MD  pantoprazole (PROTONIX) 40 MG tablet Take 1 tablet (40 mg total) by mouth daily. 12/26/17  Yes Iran OuchArida, Muhammad A, MD  RA ASPIRIN ADULT LOW STRENGTH 81 MG chewable tablet chew and swallow 1 tablet by mouth once daily Patient taking differently: Chew 81 mg by mouth daily.  12/19/17  Yes Iran OuchArida, Muhammad A, MD  rosuvastatin (CRESTOR) 10 MG tablet Take 1 tablet (10 mg total) daily by mouth. 06/23/17 04/29/18 Yes Iran OuchArida, Muhammad A, MD    Allergies Patient has no known allergies.  Family History  Problem Relation Age of  Onset  . Valvular heart disease Mother   . Diabetes Mellitus II Daughter     Social History Social History   Tobacco Use  . Smoking status: Former Smoker    Packs/day: 1.00    Years: 20.00    Pack years: 20.00    Types: Cigarettes    Last attempt to quit: 06/01/2017    Years since quitting: 0.9  . Smokeless tobacco: Never Used  Substance Use Topics  . Alcohol use: No  . Drug use: No    Review of Systems  Constitutional: No fever/chills Eyes: No visual changes. ENT: No sore throat. Cardiovascular: Denies chest pain. Respiratory: Denies shortness of breath. Gastrointestinal: No abdominal pain.  No nausea, no vomiting.  No diarrhea.  No  constipation. Genitourinary: Negative for dysuria. Musculoskeletal: Negative for back pain. Skin: Negative for rash. Neurological: Negative for headaches, focal weakness   ____________________________________________   PHYSICAL EXAM:  VITAL SIGNS: ED Triage Vitals  Enc Vitals Group     BP 04/29/18 1223 117/71     Pulse Rate 04/29/18 1223 95     Resp 04/29/18 1223 16     Temp 04/29/18 1223 98.6 F (37 C)     Temp Source 04/29/18 1223 Oral     SpO2 04/29/18 1223 94 %     Weight 04/29/18 1224 240 lb (108.9 kg)     Height 04/29/18 1224 5\' 4"  (1.626 m)     Head Circumference --      Peak Flow --      Pain Score 04/29/18 1223 10     Pain Loc --      Pain Edu? --      Excl. in GC? --     Constitutional: Alert and oriented. Well appearing and in no acute distress. Eyes: Conjunctivae are normal.  Head: Atraumatic. Nose: No congestion/rhinnorhea. Mouth/Throat: Mucous membranes are moist.  Oropharynx non-erythematous. Neck: No stridor.  Cardiovascular: Normal rate, regular rhythm. Grossly normal heart sounds.  Good peripheral circulation. Respiratory: Normal respiratory effort.  No retractions. Lungs CTAB. Gastrointestinal: Soft and nontender. No distention. No abdominal bruits. No CVA tenderness. Musculoskeletal: Left lower leg is red swollen tender and warm.  There is some oozing of serosanguineous fluid from it as well. Neurologic:  Normal speech and language. No gross focal neurologic deficits are appreciated. Skin:  Skin is warm, dry and intact. No rash noted. Psychiatric: Mood and affect are normal. Speech and behavior are normal.  ____________________________________________   LABS (all labs ordered are listed, but only abnormal results are displayed)  Labs Reviewed  CBC WITH DIFFERENTIAL/PLATELET - Abnormal; Notable for the following components:      Result Value   WBC 17.8 (*)    RDW 15.0 (*)    Neutro Abs 13.7 (*)    Monocytes Absolute 2.0 (*)    All other  components within normal limits  COMPREHENSIVE METABOLIC PANEL  TROPONIN I  URINALYSIS, COMPLETE (UACMP) WITH MICROSCOPIC   ____________________________________________  EKG EKG read interpreted by me shows normal sinus rhythm rate of 99 normal axis very irregular baseline no obvious acute ST-T changes  ____________________________________________  RADIOLOGY  ED MD interpretation: Ultrasound shows no proximal DVT patient refused evaluation of the lower leg.  Based on the patient's symptoms I think likely would be negative as well.  Official radiology report(s): US Venous Img Lower Unilateral Left  Result Date: 04/29/2018 CLINICAL DATA:  Left lower extremity pain and edema. Evaluate for DVT. The patient is currently on anticoagulation. EXAM: LEFT  LOWER EXTREMITY VENOUS DOPPLER ULTRASOUND TECHNIQUE: Gray-scale sonography with graded compression, as well as color Doppler and duplex ultrasound were performed to evaluate the lower extremity deep venous systems from the level of the common femoral vein and including the common femoral, femoral, profunda femoral, popliteal and calf veins including the posterior tibial, peroneal and gastrocnemius veins when visible. The superficial great saphenous vein was also interrogated. Spectral Doppler was utilized to evaluate flow at rest and with distal augmentation maneuvers in the common femoral, femoral and popliteal veins. COMPARISON:  None. FINDINGS: Contralateral Common Femoral Vein: Respiratory phasicity is normal and symmetric with the symptomatic side. No evidence of thrombus. Normal compressibility. Common Femoral Vein: No evidence of thrombus. Normal compressibility, respiratory phasicity and response to augmentation. Saphenofemoral Junction: No evidence of thrombus. Normal compressibility and flow on color Doppler imaging. Profunda Femoral Vein: No evidence of thrombus. Normal compressibility and flow on color Doppler imaging. Femoral Vein: No evidence  of thrombus. Normal compressibility, respiratory phasicity and response to augmentation. Patient refused sonographic evaluation of the left greater saphenous, popliteal and tibial veins. Other Findings: Note is made of a benign appearing non pathologically enlarged left inguinal lymph node which measures approximately 0.8 cm in greatest short axis diameter and maintains a benign fatty hilum, presumably reactive in etiology. IMPRESSION: No evidence of DVT involving the proximal venous system of the left lower extremity. Patient refused sonographic evaluation of the left greater saphenous, popliteal and tibial veins. Electronically Signed   By: Simonne Come M.D.   On: 04/29/2018 14:02    ____________________________________________   PROCEDURES  Procedu  Procedures  Critical Care performed:   ____________________________________________   INITIAL IMPRESSION / ASSESSMENT AND PLAN / ED COURSE Complains of being too weak to get up this morning when she slipped onto the floor.  She has increasing redness swelling and pain in the left lower leg.  She has shaking chills and mildly elevated white blood count.  Appears to be cellulitis.  Because of the fact that she is been getting worse for a month and that she was too weak to get up out of the floor this morning I think the safest thing would be to observe her for a day or so and give her IV antibiotics and then send her home.  Also her troponin is elevated.  Discussed this with Dr. Cherlynn Kaiser hospital doc he feels it probably demand ischemia patient has no chest pain.  We will get the EKG and admit her with some aspirin.       ____________________________________________   FINAL CLINICAL IMPRESSION(S) / ED DIAGNOSES  Final diagnoses:  Weakness  Cellulitis, unspecified cellulitis site     ED Discharge Orders    None       Note:  This document was prepared using Dragon voice recognition software and may include unintentional dictation  errors.    Arnaldo Natal, MD 04/29/18 1538    Arnaldo Natal, MD 04/29/18 1605    Arnaldo Natal, MD 04/29/18 [phone]

## 2018-04-29 NOTE — Progress Notes (Signed)
Anticoagulation monitoring(Lovenox):  84 yo female ordered Lovenox 40 mg Q24h  Filed Weights   04/29/18 1224 04/29/18 1727  Weight: 240 lb (108.9 kg) 253 lb 14.4 oz (115.2 kg)   BMI 43.56   Lab Results  Component Value Date   CREATININE 0.90 04/29/2018   CREATININE 0.92 06/06/2017   CREATININE 1.11 (H) 06/05/2017   Estimated Creatinine Clearance: 60 mL/min (by C-G formula based on SCr of 0.9 mg/dL). Hemoglobin & Hematocrit     Component Value Date/Time   HGB 13.0 04/29/2018 1502   HCT 39.3 04/29/2018 1502     Per Protocol for Patient with estCrcl > 30 ml/min and BMI > 40, will transition to Lovenox 40 mg Q12h.

## 2018-04-29 NOTE — ED Notes (Signed)
Pt given blanket.

## 2018-04-29 NOTE — H&P (Signed)
[employer] [facility] [place] [facility]   PATIENT NAME: Monica [name]    MR#:  [id number]  DATE OF BIRTH:  02/16/1934  DATE OF ADMISSION:  04/29/2018  PRIMARY CARE PHYSICIAN: Patient, No Pcp Per   REQUESTING/REFERRING PHYSICIAN: Dr. Dorothea Glassman  CHIEF COMPLAINT:   Chief Complaint  Patient presents with  . Fall    HISTORY OF PRESENT ILLNESS:  Monica [name]  is a 84 y.o. female with a known history of moderate aortic stenosis, history of coronary artery disease status post stent placement, hypertension, hyperlipidemia who presents to the hospital after a fall and noted to have left lower extremity redness and pain.  Patient says she slipped out of her bed this morning was significantly weak and could not get herself back up.  She lives with her husband attempted to get her up but could not help her, EMS was called and when EMS arrived they noticed her left leg was somewhat swollen and red and therefore brought her to to the ER for further evaluation.  In the emergency room patient was clinically diagnosed with a left lower extremity cellulitis and also noted to have an elevated troponin incidentally.  Hospitalist services were contacted for admission.  Patient denies any chest pains, shortness of breath, abdominal pain, nausea, vomiting, fever.  She admits to some chills but no other associated symptoms.  He does complain of some left lower extremity pain over the past few days and also has noticed redness in the left lower ext.   PAST MEDICAL HISTORY:   Past Medical History:  Diagnosis Date  . Aortic stenosis    a. LHC 06/02/17: At least moderate aortic stenosis with a peak to peak gradient of 22 mmHg; b. TTE 05/2017: EF 55-60%, mild HK basal-midinferior wall, Gr1DD, mod to sev AS w/ mean gradient 21 mmHg, valve area 0.99, mild MR, mildly dilated LA   . CAD (coronary artery disease) 2018   a. inferior STEMI 06/02/2017: LHC 06/02/17: LM 20, D1 20%, o-pLCx 50, p-mRCA 100% s/p  PCI/DES, mRCA 50, dRCA 30  . GI bleed    a. noted 06/05/2017  . HLD (hyperlipidemia)   . Obesity     PAST SURGICAL HISTORY:   Past Surgical History:  Procedure Laterality Date  . ANKLE RECONSTRUCTION  1956   also ORIF of right arm  . CORONARY STENT INTERVENTION N/A 06/01/2017   Procedure: Coronary/Graft Acute MI Revascularization;  Surgeon: Iran Ouch, MD;  Location: [facility] INVASIVE CV LAB;  Service: Cardiovascular;  Laterality: N/A;  . LEFT HEART CATH AND CORONARY ANGIOGRAPHY N/A 06/01/2017   Procedure: LEFT HEART CATH AND CORONARY ANGIOGRAPHY;  Surgeon: Iran Ouch, MD;  Location: [facility] INVASIVE CV LAB;  Service: Cardiovascular;  Laterality: N/A;    SOCIAL HISTORY:   Social History   Tobacco Use  . Smoking status: Former Smoker    Packs/day: 0.50    Years: 50.00    Pack years: 25.00    Types: Cigarettes    Last attempt to quit: 06/01/2017    Years since quitting: 0.9  . Smokeless tobacco: Never Used  Substance Use Topics  . Alcohol use: No    FAMILY HISTORY:   Family History  Problem Relation Age of Onset  . Valvular heart disease Mother   . Diabetes Mellitus II Daughter     DRUG ALLERGIES:  No Known Allergies  REVIEW OF SYSTEMS:   Review of Systems  Constitutional: Negative for fever and weight loss.  HENT: Negative for  congestion, nosebleeds and tinnitus.   Eyes: Negative for blurred vision, double vision and redness.  Respiratory: Negative for cough, hemoptysis and shortness of breath.   Cardiovascular: Negative for chest pain, orthopnea, leg swelling and PND.  Gastrointestinal: Negative for abdominal pain, diarrhea, melena, nausea and vomiting.  Genitourinary: Negative for dysuria, hematuria and urgency.  Musculoskeletal: Positive for falls. Negative for joint pain.  Skin: Positive for rash (Left Lower Ext. cellulitis).  Neurological: Negative for dizziness, tingling, sensory change, focal weakness, seizures, weakness and headaches.   Endo/Heme/Allergies: Negative for polydipsia. Does not bruise/bleed easily.  Psychiatric/Behavioral: Negative for depression and memory loss. The patient is not nervous/anxious.     MEDICATIONS AT HOME:   Prior to Admission medications   Medication Sig Start Date End Date Taking? Authorizing Provider  carvedilol (COREG) 3.125 MG tablet take 1 tablet by mouth twice a day with food 03/19/18  Yes Iran OuchArida, Muhammad A, MD  clopidogrel (PLAVIX) 75 MG tablet Take 1 tablet (75 mg total) by mouth daily. 09/23/17  Yes Iran OuchArida, Muhammad A, MD  nitroGLYCERIN (NITROSTAT) 0.4 MG SL tablet Place 1 tablet (0.4 mg total) under the tongue every 5 (five) minutes as needed for chest pain. 06/04/17  Yes Shaune Pollackhen, Qing, MD  pantoprazole (PROTONIX) 40 MG tablet Take 1 tablet (40 mg total) by mouth daily. 12/26/17  Yes Iran OuchArida, Muhammad A, MD  RA ASPIRIN ADULT LOW STRENGTH 81 MG chewable tablet chew and swallow 1 tablet by mouth once daily Patient taking differently: Chew 81 mg by mouth daily.  12/19/17  Yes Iran OuchArida, Muhammad A, MD  rosuvastatin (CRESTOR) 10 MG tablet Take 1 tablet (10 mg total) daily by mouth. 06/23/17 04/29/18 Yes Iran OuchArida, Muhammad A, MD      VITAL SIGNS:  Blood pressure 117/71, pulse 95, temperature 98.6 F (37 C), temperature source Oral, resp. rate 16, height 5\' 4"  (1.626 m), weight 108.9 kg, SpO2 94 %.  PHYSICAL EXAMINATION:  Physical Exam  GENERAL:  84 y.o.-year-old patient lying in bed in no acute distress.  EYES: Pupils equal, round, reactive to light and accommodation. No scleral icterus. Extraocular muscles intact.  HEENT: Head atraumatic, normocephalic. Oropharynx and nasopharynx clear. No oropharyngeal erythema, moist oral mucosa  NECK:  Supple, no jugular venous distention. No thyroid enlargement, no tenderness.  LUNGS: Normal breath sounds bilaterally, no wheezing, rales, rhonchi. No use of accessory muscles of respiration.  CARDIOVASCULAR: S1, S2 RRR. Loud III/VI SEM at RSB, No rubs, gallops,  clicks.  ABDOMEN: Soft, nontender, nondistended. Bowel sounds present. No organomegaly or mass.  EXTREMITIES: No pedal edema, cyanosis, or clubbing. + 2 pedal & radial pulses b/l.   NEUROLOGIC: Cranial nerves II through XII are intact. No focal Motor or sensory deficits appreciated b/l PSYCHIATRIC: The patient is alert and oriented x 3.  SKIN: No obvious rash, lesion, or ulcer.  Left lower ext. Redness, swelling due to cellulitis.   LABORATORY PANEL:   CBC Recent Labs  Lab 04/29/18 1502  WBC 17.8*  HGB 13.0  HCT 39.3  PLT 227   ------------------------------------------------------------------------------------------------------------------  Chemistries  Recent Labs  Lab 04/29/18 1502  NA 132*  K 3.7  CL 97*  CO2 25  GLUCOSE 110*  BUN 17  CREATININE 0.90  CALCIUM 8.7*  AST 34  ALT 13  ALKPHOS 67  BILITOT 3.9*   ------------------------------------------------------------------------------------------------------------------  Cardiac Enzymes Recent Labs  Lab 04/29/18 1502  TROPONINI 0.23*   ------------------------------------------------------------------------------------------------------------------  RADIOLOGY:  [place] Venous Img Lower Unilateral Left  Result Date: 04/29/2018 CLINICAL DATA:  Left  lower extremity pain and edema. Evaluate for DVT. The patient is currently on anticoagulation. EXAM: LEFT LOWER EXTREMITY VENOUS DOPPLER ULTRASOUND TECHNIQUE: Gray-scale sonography with graded compression, as well as color Doppler and duplex ultrasound were performed to evaluate the lower extremity deep venous systems from the level of the common femoral vein and including the common femoral, femoral, profunda femoral, popliteal and calf veins including the posterior tibial, peroneal and gastrocnemius veins when visible. The superficial great saphenous vein was also interrogated. Spectral Doppler was utilized to evaluate flow at rest and with distal augmentation maneuvers in  the common femoral, femoral and popliteal veins. COMPARISON:  None. FINDINGS: Contralateral Common Femoral Vein: Respiratory phasicity is normal and symmetric with the symptomatic side. No evidence of thrombus. Normal compressibility. Common Femoral Vein: No evidence of thrombus. Normal compressibility, respiratory phasicity and response to augmentation. Saphenofemoral Junction: No evidence of thrombus. Normal compressibility and flow on color Doppler imaging. Profunda Femoral Vein: No evidence of thrombus. Normal compressibility and flow on color Doppler imaging. Femoral Vein: No evidence of thrombus. Normal compressibility, respiratory phasicity and response to augmentation. Patient refused sonographic evaluation of the left greater saphenous, popliteal and tibial veins. Other Findings: Note is made of a benign appearing non pathologically enlarged left inguinal lymph node which measures approximately 0.8 cm in greatest short axis diameter and maintains a benign fatty hilum, presumably reactive in etiology. IMPRESSION: No evidence of DVT involving the proximal venous system of the left lower extremity. Patient refused sonographic evaluation of the left greater saphenous, popliteal and tibial veins. Electronically Signed   By: Simonne Come M.D.   On: 04/29/2018 14:02     IMPRESSION AND PLAN:   84 year old female with past medical history of coronary artery disease status post stent placement, hypertension, hyperlipidemia, moderate aortic stenosis who presents to the hospital after a fall and noted to have a left lower extremity cellulitis.  1.  Left lower extremity cellulitis-this is a cause of patient's left lower extremity redness swelling and pain.  Patient underwent Doppler of her left upper part of her lower extremity which was negative for DVT. - place patient on IV vancomycin for the cellulitis, follow clinically.  2.  Status post fall- we will get physical therapy consult to assess mobility given  her deconditioning and weakness.  3.  Elevated troponin-secondary to supply demand ischemia from underlying aortic stenosis.  Patient is clinically asymptomatic with no further chest pain or shortness of breath.  I will observe her on telemetry, cycle her markers, I will get a limited echocardiogram to further evaluate her aortic valve. -If needed would consider cardiology consult.  Patient follows up with Dr. Kirke Corin.   4.  Hyperlipidemia-continue Crestor.  5.  History of coronary artery disease-continue aspirin, Crestor, Plavix, carvedilol.  6.  GERD-continue Protonix.    All the records are reviewed and case discussed with ED provider. Management plans discussed with the patient, family and they are in agreement.  CODE STATUS: Full code  TOTAL TIME TAKING CARE OF THIS PATIENT: 40 minutes.    Houston Siren M.D on 04/29/2018 at 4:39 PM  Between 7am to 6pm - Pager - [phone]  After 6pm go to [email or web address] - password [email or web address]  [facility]  Office  [phone]  CC: Primary care physician; Patient, No Pcp Per

## 2018-04-30 ENCOUNTER — Other Ambulatory Visit: Payer: Self-pay

## 2018-04-30 ENCOUNTER — Inpatient Hospital Stay (HOSPITAL_COMMUNITY)
Admit: 2018-04-30 | Discharge: 2018-04-30 | Disposition: A | Discharge status: Home / Self Care | Payer: Medicare Other | Attending: Specialist | Admitting: Specialist

## 2018-04-30 DIAGNOSIS — I351 Nonrheumatic aortic (valve) insufficiency: Secondary | ICD-10-CM

## 2018-04-30 LAB — ECHOCARDIOGRAM LIMITED
HEIGHTINCHES: 64 in
WEIGHTICAEL: 4062.4 [oz_av]

## 2018-04-30 LAB — BASIC METABOLIC PANEL
Anion gap: 7 (ref 5–15)
BUN: 17 mg/dL (ref 8–23)
CALCIUM: 8.2 mg/dL — AB (ref 8.9–10.3)
CHLORIDE: 102 mmol/L (ref 98–111)
CO2: 24 mmol/L (ref 22–32)
CREATININE: 0.86 mg/dL (ref 0.44–1.00)
GFR calc non Af Amer: >60 mL/min (ref >60)
Glucose, Bld: 151 mg/dL — ABNORMAL HIGH (ref 70–99)
Potassium: 3.7 mmol/L (ref 3.5–5.1)
SODIUM: 133 mmol/L — AB (ref 135–145)

## 2018-04-30 LAB — CBC
HCT: 34.7 % — ABNORMAL LOW (ref 35.0–47.0)
Hemoglobin: 11.6 g/dL — ABNORMAL LOW (ref 12.0–16.0)
MCH: 29.2 pg (ref 26.0–34.0)
MCHC: 33.5 g/dL (ref 32.0–36.0)
MCV: 87.2 fL (ref 80.0–100.0)
PLATELETS: 203 10*3/uL (ref 150–440)
RBC: 3.98 MIL/uL (ref 3.80–5.20)
RDW: 14.7 % — AB (ref 11.5–14.5)
WBC: 13 10*3/uL — AB (ref 3.6–11.0)

## 2018-04-30 LAB — TROPONIN I: TROPONIN I: 0.18 ng/mL — AB (ref <0.03)

## 2018-04-30 NOTE — Progress Notes (Signed)
Family Meeting Note  Advance Directive:no  Today a meeting took place with the Patient.  The following clinical team members were present during this meeting:MD  The following were discussed:Patient's diagnosis: LLE cellulitis , Patient's progosis: > 12 months and Goals for treatment: DNR  Additional follow-up to be provided: DNR Chaplain consulted to start advanced directives  Time spent during discussion:16 minutes  Monica [name], Patricia PesaSITAL, MD

## 2018-04-30 NOTE — Plan of Care (Signed)

## 2018-04-30 NOTE — Progress Notes (Signed)
[facility] - [place] at [facility]   PATIENT NAME: Monica [name]    MR#:  [id number]  DATE OF BIRTH:  09/05/1933  SUBJECTIVE:   Patient here with left lower extremity edema and cellulitis  REVIEW OF SYSTEMS:    Review of Systems  Constitutional: Negative for fever, chills weight loss HENT: Negative for ear pain, nosebleeds, congestion, facial swelling, rhinorrhea, neck pain, neck stiffness and ear discharge.   Respiratory: Negative for cough, shortness of breath, wheezing  Cardiovascular: Negative for chest pain, palpitations and leg swelling.  Gastrointestinal: Negative for heartburn, abdominal pain, vomiting, diarrhea or consitpation Genitourinary: Negative for dysuria, urgency, frequency, hematuria Musculoskeletal: Negative for back pain or joint pain Neurological: Negative for dizziness, seizures, syncope, focal weakness,  numbness and headaches.  Hematological: Does not bruise/bleed easily.  Psychiatric/Behavioral: Negative for hallucinations, confusion, dysphoric mood In: Left leg swelling and redness   Tolerating Diet: yes      DRUG ALLERGIES:  No Known Allergies  VITALS:  Blood pressure 103/61, pulse 90, temperature 98.4 F (36.9 C), temperature source Oral, resp. rate 12, height 5\' 4"  (1.626 m), weight 115.2 kg, SpO2 91 %.  PHYSICAL EXAMINATION:  Constitutional: Appears base. No distress. HENT: Normocephalic. Marland Kitchen Oropharynx is clear and moist.  Eyes: Conjunctivae and EOM are normal. PERRLA, no scleral icterus.  Neck: Normal ROM. Neck supple. No JVD. No tracheal deviation. CVS: RRR, S1/S2 +, no murmurs, no gallops, no carotid bruit.  Pulmonary: Effort and breath sounds normal, no stridor, rhonchi, wheezes, rales.  Abdominal: Soft. BS +,  no distension, tenderness, rebound or guarding.  Musculoskeletal: Normal range of motion. No edema and no tenderness.  Neuro: Alert. CN 2-12 grossly intact. No focal deficits. Skin: Left lower extremity with  erythema, tenderness to touch and warmth with 4+ edema   psychiatric: Normal mood and affect.      LABORATORY PANEL:   CBC Recent Labs  Lab 04/30/18 0145  WBC 13.0*  HGB 11.6*  HCT 34.7*  PLT 203   ------------------------------------------------------------------------------------------------------------------  Chemistries  Recent Labs  Lab 04/29/18 1502 04/30/18 0145  NA 132* 133*  K 3.7 3.7  CL 97* 102  CO2 25 24  GLUCOSE 110* 151*  BUN 17 17  CREATININE 0.90 0.86  CALCIUM 8.7* 8.2*  AST 34  --   ALT 13  --   ALKPHOS 67  --   BILITOT 3.9*  --    ------------------------------------------------------------------------------------------------------------------  Cardiac Enzymes Recent Labs  Lab 04/29/18 1750 04/29/18 2117 04/30/18 0145  TROPONINI 0.16* 0.19* 0.18*   ------------------------------------------------------------------------------------------------------------------  RADIOLOGY:  US Venous Img Lower Unilateral Left  Result Date: 04/29/2018 CLINICAL DATA:  Left lower extremity pain and edema. Evaluate for DVT. The patient is currently on anticoagulation. EXAM: LEFT LOWER EXTREMITY VENOUS DOPPLER ULTRASOUND TECHNIQUE: Gray-scale sonography with graded compression, as well as color Doppler and duplex ultrasound were performed to evaluate the lower extremity deep venous systems from the level of the common femoral vein and including the common femoral, femoral, profunda femoral, popliteal and calf veins including the posterior tibial, peroneal and gastrocnemius veins when visible. The superficial great saphenous vein was also interrogated. Spectral Doppler was utilized to evaluate flow at rest and with distal augmentation maneuvers in the common femoral, femoral and popliteal veins. COMPARISON:  None. FINDINGS: Contralateral Common Femoral Vein: Respiratory phasicity is normal and symmetric with the symptomatic side. No evidence of thrombus. Normal  compressibility. Common Femoral Vein: No evidence of thrombus. Normal compressibility, respiratory phasicity and response to augmentation. Saphenofemoral  Junction: No evidence of thrombus. Normal compressibility and flow on color Doppler imaging. Profunda Femoral Vein: No evidence of thrombus. Normal compressibility and flow on color Doppler imaging. Femoral Vein: No evidence of thrombus. Normal compressibility, respiratory phasicity and response to augmentation. Patient refused sonographic evaluation of the left greater saphenous, popliteal and tibial veins. Other Findings: Note is made of a benign appearing non pathologically enlarged left inguinal lymph node which measures approximately 0.8 cm in greatest short axis diameter and maintains a benign fatty hilum, presumably reactive in etiology. IMPRESSION: No evidence of DVT involving the proximal venous system of the left lower extremity. Patient refused sonographic evaluation of the left greater saphenous, popliteal and tibial veins. Electronically Signed   By: Simonne ComeJohn  Watts M.D.   On: 04/29/2018 14:02     ASSESSMENT AND PLAN:   84 year old female with history of CAD who presents with left lower extremity edema.  1.  Left lower extremity cellulitis with erythema and pain: Lower extreme Dopplers negative for DVT Elevate legs on 2-3 pillows Continue IV vancomycin and if no improvement then consider CT scan tomorrow to rule out abscess  2.  Status post fall: PT consultation  3.  Elevated troponin due to demand ischemia. Troponins are flat Follow-up on echocardiogram Patient follows up with Dr. Kirke CorinArida   4.  Hyperlipidemia: Continue statin  5. CAD: Continue ASA, Crestor, Plavix, Coreg     Management plans discussed with the patient and she is in agreement.  CODE STATUS: DNR  TOTAL TIME TAKING CARE OF THIS PATIENT: 30 minutes.     POSSIBLE D/C 1-2 days, DEPENDING ON CLINICAL CONDITION.   Raynald Rouillard M.D on 04/30/2018 at 10:14  AM  Between 7am to 6pm - Pager - [phone] After 6pm go to [email or web address] - password [email or web address]  [facility] [place] [facility]  Office  [phone]  CC: Primary care physician; Patient, No Pcp Per  Note: This dictation was prepared with Dragon dictation along with smaller phrase technology. Any transcriptional errors that result from this process are unintentional.

## 2018-04-30 NOTE — Progress Notes (Signed)
Nutrition Brief Note  Patient identified to be seen for malnutrition screening tool.   Filed Weights   04/29/18 1224 04/29/18 1727  Weight: 108.9 kg 115.2 kg   Pt is a 84 y.o. Female w PMHx of CAD, HLD, and coronary stent intervention admitted for cellulitis of the left lower extremity. Upon visitation with patient she says she has had a healthy appetite in the hospital and PTA. She reports no issues with eating, chewing/swallowing, N/V, or irregular bowel movements. Weight is stable. Physical exam shows no fat or muscle wasting. Pt does not meet criteria for malnutrition at this time.   Body mass index is 43.58 kg/m. Patient meets criteria for obesity class III based on current BMI.   Current diet order is Heart Healthy and patient is consuming 100% of meals. Patient educated on heart healthy diet during previous admission. Patient has no questions at this time. No nutrition interventions are warranted. Please consult RD if nutrition issues arise.    Athaliah Baumbach, Dietetic Intern [phone]

## 2018-04-30 NOTE — Evaluation (Signed)
Physical Therapy Evaluation Patient Details Name: Monica [name] MRN: [id number] DOB: 1934/06/27 Today's Date: 04/30/2018   History of Present Illness  presented to ER after 'slip' from bed, unable to recover despite help from husband, noted with significant L LE redness/swelling; admitted for management of L LE cellulitis.  Of note, patient with mild elevation in troponin (peak 0.19), attributed to demand ischemia per notes.  Clinical Impression  Upon evaluation, patient alert and oriented; follows all commands and demonstrates fair effort with functional activities.  L LE generally painful (rated 8/10; meds administered during session); poor tolerance for ROM and WBing with mobility tasks.  Currently completes bed mobility with mod indep; sit/stand and bed/chair transfer (squat/stand pivot) with bilat UEs on armrests of chair, min/mod assist +1. Unsafe/unable for additional mobility/gait efforts.  Patient generally unsafe with transfer-poor tolerance for L LE WBing in closed-chain activities, limited safety/insight.  Did introduce and encourage use of RW to improve safety, but patient consistently declines "because I'm not used to that".  Will continue to educate on use of RW with goal of improving safety/indep with all functional activities (and to allow appropriate offloading, imrpoved pain control to L LE). At this time, unable to demonstrate ability to safely and effectively complete mobility tasks necessary to facilitate safe discharge home (simple transfers, household distance gait, stairs). Would benefit from skilled PT to address above deficits and promote optimal return to PLOF; recommend transition to STR upon discharge from acute hospitalization.     Follow Up Recommendations SNF    Equipment Recommendations       Recommendations for Other Services       Precautions / Restrictions Precautions Precautions: Fall Restrictions Weight Bearing Restrictions: No      Mobility  Bed  Mobility Overal bed mobility: Modified Independent                Transfers Overall transfer level: Needs assistance   Transfers: Sit to/from Stand Sit to Stand: Min assist;Mod assist         General transfer comment: sit/stand and SPT from bed/chair, min/mod assist.  Patient fearful/unable to problem solve use of RW, prefers to complete stand/squat pivot hold armrests of recliner.  Broad BOS, poor balance reactions, generally unsafe; however, limited receptiveness to therapist cuing/input.  Ambulation/Gait             General Gait Details: unsafe/unable due to pain in L LE  Stairs            Wheelchair Mobility    Modified Rankin (Stroke Patients Only)       Balance Overall balance assessment: Needs assistance Sitting-balance support: No upper extremity supported;Feet supported Sitting balance-Leahy Scale: Good     Standing balance support: Bilateral upper extremity supported Standing balance-Leahy Scale: Poor                               Pertinent Vitals/Pain Pain Assessment: 0-10 Pain Score: 8  Pain Location: L LE Pain Descriptors / Indicators: Aching;Grimacing;Guarding Pain Intervention(s): Limited activity within patient's tolerance;Monitored during session;Repositioned;Patient requesting pain meds-RN notified;RN gave pain meds during session    Home Living Family/patient expects to be discharged to:: Private residence Living Arrangements: Spouse/significant other Available Help at Discharge: Family Type of Home: House Home Access: Stairs to enter   Secretary/administrator of Steps: 4+3 Home Layout: One level Home Equipment: Environmental consultant - 2 wheels;Cane - single point      Prior Function Level of  Independence: Independent         Comments: Indep with ADLs, household and community mobilization; intermittent use of SPC as needed.     Hand Dominance   Dominant Hand: Right    Extremity/Trunk Assessment   Upper Extremity  Assessment Upper Extremity Assessment: Overall WFL for tasks assessed    Lower Extremity Assessment Lower Extremity Assessment: (L LE grossly 3-/5, limited by pain; unable to tolerate full knee flexion due to pain/edema.  R LE grossly 4/5)       Communication   Communication: No difficulties  Cognition Arousal/Alertness: Awake/alert Behavior During Therapy: WFL for tasks assessed/performed Overall Cognitive Status: Within Functional Limits for tasks assessed                                        General Comments      Exercises  L LE ankle pumps, 1x10; positioned in elevation for edema management Educated on role of/use of RW for basic transfers and mobility; patient voiced understanding, but continues to decline use.  Will continue education as appropriate.   Assessment/Plan    PT Assessment Patient needs continued PT services  PT Problem List Decreased strength;Decreased range of motion;Decreased activity tolerance;Decreased balance;Decreased mobility;Decreased coordination;Decreased knowledge of use of DME;Decreased safety awareness;Decreased knowledge of precautions;Obesity;Pain       PT Treatment Interventions DME instruction;Gait training;Functional mobility training;Therapeutic activities;Therapeutic exercise;Patient/family education;Stair training;Balance training    PT Goals (Current goals can be found in the Care Plan section)  Acute Rehab PT Goals Patient Stated Goal: to go home-"I don't go to rehab" PT Goal Formulation: With patient Time For Goal Achievement: 05/14/18 Potential to Achieve Goals: Good    Frequency Min 2X/week   Barriers to discharge Decreased caregiver support      Co-evaluation               AM-PAC PT "6 Clicks" Daily Activity  Outcome Measure Difficulty turning over in bed (including adjusting bedclothes, sheets and blankets)?: A Little Difficulty moving from lying on back to sitting on the side of the bed? : A  Little Difficulty sitting down on and standing up from a chair with arms (e.g., wheelchair, bedside commode, etc,.)?: Unable Help needed moving to and from a bed to chair (including a wheelchair)?: A Lot Help needed walking in hospital room?: Total Help needed climbing 3-5 steps with a railing? : Total 6 Click Score: 11    End of Session Equipment Utilized During Treatment: Gait belt Activity Tolerance: Patient limited by pain Patient left: in chair;with call bell/phone within reach;with chair alarm set;with nursing/sitter in room Nurse Communication: Mobility status PT Visit Diagnosis: Unsteadiness on feet (R26.81);Muscle weakness (generalized) (M62.81);Difficulty in walking, not elsewhere classified (R26.2);Pain Pain - Right/Left: Left Pain - part of body: Leg    Time: [id number] PT Time Calculation (min) (ACUTE ONLY): 21 min   Charges:   PT Evaluation $PT Eval Moderate Complexity: 1 Mod PT Treatments $Therapeutic Activity: 8-22 mins       Miriya Cloer H. Manson PasseyBrown, PT, DPT, NCS 04/30/18, 10:32 AM [phone]

## 2018-04-30 NOTE — Progress Notes (Signed)
*  PRELIMINARY RESULTS* Echocardiogram 2D Echocardiogram has been performed.  Cristela BlueHege, Liany Mumpower 04/30/2018, 12:50 PM

## 2018-04-30 NOTE — Clinical Social Work Note (Signed)
Clinical Social Work Assessment  Patient Details  Name: Monica [name] MRN: [id number] Date of Birth: 01/18/1934  Date of referral:  04/30/18               Reason for consult:  Facility Placement                Permission sought to share information with:  Case Manager, Customer service manager, Family Supports Permission granted to share information::  Yes, Verbal Permission Granted  Name::        Agency::     Relationship::     Contact Information:     Housing/Transportation Living arrangements for the past 2 months:  Single Family Home Source of Information:  Patient Patient Interpreter Needed:  None Criminal Activity/Legal Involvement Pertinent to Current Situation/Hospitalization:  No - Comment as needed Significant Relationships:  Adult Children, Spouse Lives with:  Spouse Do you feel safe going back to the place where you live?  Yes Need for family participation in patient care:  Yes (Comment)  Care giving concerns:  Patient lives with her husband and grandson    Facilities manager / plan:  CSW consulted for SNF placement. CSW met with patient. CSW introduced self and explained role. Patient states that she lives with her husband and grandson and has no intention of going to a facility. CSW explained PT recommendation. Patient states that she will not be going to a facility and would like to go home. CSW asked about home health and patient states that she does not need any assistance. CSW notified RN CM of above. CSW signing off. Please reconsult if further needs arise.   Employment status:  Retired Forensic scientist:  [employer] PT Recommendations:  [place] / Referral to community resources:  [place]  Patient/Family's Response to care:  Patient thanked CSW for assistance   Patient/Family's Understanding of and Emotional Response to Diagnosis, Current Treatment, and Prognosis:  Patient understanding of current  treatment   Emotional Assessment Appearance:  Appears younger than stated age Attitude/Demeanor/Rapport:    Affect (typically observed):  Accepting, Adaptable, Pleasant, Happy Orientation:  Oriented to Self, Oriented to Place, Oriented to  Time, Oriented to Situation Alcohol / Substance use:  Not Applicable Psych involvement (Current and /or in the community):  No (Comment)  Discharge Needs  Concerns to be addressed:  Discharge Planning Concerns Readmission within the last 30 days:  No Current discharge risk:  None Barriers to Discharge:  Continued Medical Work up   [employer], [place] 04/30/2018, 3:23 PM

## 2018-04-30 NOTE — Progress Notes (Signed)
   04/30/18 1035  Clinical Encounter Type  Visited With Patient  Visit Type Initial;Spiritual support  Referral From Nurse  Consult/Referral To Chaplain  Spiritual Encounters  Spiritual Needs Emotional;Other (Comment)   CH received an OR to educate the patient on the AD process. The patient was in good spirits and sitting with her legs propped up. Her left leg is severally swollen from what she reported as cellulites. I discussed the AD process and she stated that she felt like the DRN she has in place is fine. She will consult with her husband to see what he thinks about doing an AD. Ms. Monica [name] will let her RN know if she wants to complete the AD.

## 2018-05-01 ENCOUNTER — Inpatient Hospital Stay: Payer: Medicare Other

## 2018-05-01 ENCOUNTER — Encounter: Payer: Self-pay | Admitting: Radiology

## 2018-05-01 LAB — BASIC METABOLIC PANEL
Anion gap: 7 (ref 5–15)
BUN: 16 mg/dL (ref 8–23)
CHLORIDE: 102 mmol/L (ref 98–111)
CO2: 24 mmol/L (ref 22–32)
Calcium: 8.2 mg/dL — ABNORMAL LOW (ref 8.9–10.3)
Creatinine, Ser: 0.92 mg/dL (ref 0.44–1.00)
GFR calc non Af Amer: 56 mL/min — ABNORMAL LOW (ref >60)
Glucose, Bld: 110 mg/dL — ABNORMAL HIGH (ref 70–99)
POTASSIUM: 4 mmol/L (ref 3.5–5.1)
Sodium: 133 mmol/L — ABNORMAL LOW (ref 135–145)

## 2018-05-01 MED ORDER — IOPAMIDOL (ISOVUE-300) INJECTION 61%
100.0000 mL | Freq: Once | INTRAVENOUS | Status: AC | PRN
  Administered 2018-05-01: 100 mL via INTRAVENOUS

## 2018-05-01 NOTE — Plan of Care (Signed)

## 2018-05-01 NOTE — Progress Notes (Signed)
[facility] - [place] at [facility]   PATIENT NAME: Monica ArmourSylvia Cake    MR#:  [id number]  DATE OF BIRTH:  27-Oct-1933  SUBJECTIVE:   Erythema has improved however pain continues of lower extremity She has some drainage from the left leg  REVIEW OF SYSTEMS:    Review of Systems  Constitutional: Negative for fever, chills weight loss HENT: Negative for ear pain, nosebleeds, congestion, facial swelling, rhinorrhea, neck pain, neck stiffness and ear discharge.   Respiratory: Negative for cough, shortness of breath, wheezing  Cardiovascular: Negative for chest pain, palpitations and leg swelling.  Gastrointestinal: Negative for heartburn, abdominal pain, vomiting, diarrhea or consitpation Genitourinary: Negative for dysuria, urgency, frequency, hematuria Musculoskeletal: Negative for back pain or joint pain Neurological: Negative for dizziness, seizures, syncope, focal weakness,  numbness and headaches.  Hematological: Does not bruise/bleed easily.  Psychiatric/Behavioral: Negative for hallucinations, confusion, dysphoric mood  Skin: Cellulitis with improvement it appears that patient has some fluctuance at the bottom of her leg  Tolerating Diet: yes      DRUG ALLERGIES:  No Known Allergies  VITALS:  Blood pressure 112/64, pulse 85, temperature 99 F (37.2 C), temperature source Oral, resp. rate 18, height 5\' 4"  (1.626 m), weight 115.2 kg, SpO2 94 %.  PHYSICAL EXAMINATION:  Constitutional: Appears base. No distress. HENT: Normocephalic. Marland Kitchen. Oropharynx is clear and moist.  Eyes: Conjunctivae and EOM are normal. PERRLA, no scleral icterus.  Neck: Normal ROM. Neck supple. No JVD. No tracheal deviation. CVS: RRR, S1/S2 +, no murmurs, no gallops, no carotid bruit.  Pulmonary: Effort and breath sounds normal, no stridor, rhonchi, wheezes, rales.  Abdominal: Soft. BS +,  no distension, tenderness, rebound or guarding.  Musculoskeletal: Normal range of motion. No edema  and no tenderness.  Neuro: Alert. CN 2-12 grossly intact. No focal deficits. Skin: Left lower extremity with erythema (improved) tenderness to touch and warmth with 4+ edema   psychiatric: Normal mood and affect.      LABORATORY PANEL:   CBC Recent Labs  Lab 04/30/18 0145  WBC 13.0*  HGB 11.6*  HCT 34.7*  PLT 203   ------------------------------------------------------------------------------------------------------------------  Chemistries  Recent Labs  Lab 04/29/18 1502  05/01/18 0434  NA 132*   < > 133*  K 3.7   < > 4.0  CL 97*   < > 102  CO2 25   < > 24  GLUCOSE 110*   < > 110*  BUN 17   < > 16  CREATININE 0.90   < > 0.92  CALCIUM 8.7*   < > 8.2*  AST 34  --   --   ALT 13  --   --   ALKPHOS 67  --   --   BILITOT 3.9*  --   --    < > = values in this interval not displayed.   ------------------------------------------------------------------------------------------------------------------  Cardiac Enzymes Recent Labs  Lab 04/29/18 1750 04/29/18 2117 04/30/18 0145  TROPONINI 0.16* 0.19* 0.18*   ------------------------------------------------------------------------------------------------------------------  RADIOLOGY:  [place] Venous Img Lower Unilateral Left  Result Date: 04/29/2018 CLINICAL DATA:  Left lower extremity pain and edema. Evaluate for DVT. The patient is currently on anticoagulation. EXAM: LEFT LOWER EXTREMITY VENOUS DOPPLER ULTRASOUND TECHNIQUE: Gray-scale sonography with graded compression, as well as color Doppler and duplex ultrasound were performed to evaluate the lower extremity deep venous systems from the level of the common femoral vein and including the common femoral, femoral, profunda femoral, popliteal and calf veins including the posterior tibial, peroneal  and gastrocnemius veins when visible. The superficial great saphenous vein was also interrogated. Spectral Doppler was utilized to evaluate flow at rest and with distal augmentation  maneuvers in the common femoral, femoral and popliteal veins. COMPARISON:  None. FINDINGS: Contralateral Common Femoral Vein: Respiratory phasicity is normal and symmetric with the symptomatic side. No evidence of thrombus. Normal compressibility. Common Femoral Vein: No evidence of thrombus. Normal compressibility, respiratory phasicity and response to augmentation. Saphenofemoral Junction: No evidence of thrombus. Normal compressibility and flow on color Doppler imaging. Profunda Femoral Vein: No evidence of thrombus. Normal compressibility and flow on color Doppler imaging. Femoral Vein: No evidence of thrombus. Normal compressibility, respiratory phasicity and response to augmentation. Patient refused sonographic evaluation of the left greater saphenous, popliteal and tibial veins. Other Findings: Note is made of a benign appearing non pathologically enlarged left inguinal lymph node which measures approximately 0.8 cm in greatest short axis diameter and maintains a benign fatty hilum, presumably reactive in etiology. IMPRESSION: No evidence of DVT involving the proximal venous system of the left lower extremity. Patient refused sonographic evaluation of the left greater saphenous, popliteal and tibial veins. Electronically Signed   By: Simonne Come M.D.   On: 04/29/2018 14:02     ASSESSMENT AND PLAN:   84 year old female with history of CAD who presents with left lower extremity edema.  1.  Left lower extremity cellulitis with erythema and pain: Lower extreme Dopplers negative for DVT To need to elevate legs on 2-3 pillows Continue IV vancomycin CT scan to rule out abscess  2.  Status post fall: PT consultation recommends skilled nursing facility however patient will go home with home health  3.  Elevated troponin due to demand ischemia. Troponins are flat Echocardiogram shows normal ejection fraction with diastolic dysfunction no wall motion abnormality   4.  Hyperlipidemia: Continue  statin  5. CAD: Continue ASA, Crestor, Plavix, Coreg     Management plans discussed with the patient and she is in agreement.  CODE STATUS: DNR  TOTAL TIME TAKING CARE OF THIS PATIENT: 24 minutes.     POSSIBLE D/C 1-2 days home with home health, DEPENDING ON CLINICAL CONDITION.   Aideen Fenster M.D on 05/01/2018 at 9:03 AM  Between 7am to 6pm - Pager - [phone] After 6pm go to [email or web address] - password [email or web address]  [facility] [place] [facility]  Office  [phone]  CC: Primary care physician; Patient, No Pcp Per  Note: This dictation was prepared with Dragon dictation along with smaller phrase technology. Any transcriptional errors that result from this process are unintentional.

## 2018-05-01 NOTE — Care Management Important Message (Addendum)
Copy of signed IM left in patient's room.    

## 2018-05-02 LAB — BASIC METABOLIC PANEL
ANION GAP: 5 (ref 5–15)
BUN: 19 mg/dL (ref 8–23)
CHLORIDE: 105 mmol/L (ref 98–111)
CO2: 26 mmol/L (ref 22–32)
Calcium: 8.3 mg/dL — ABNORMAL LOW (ref 8.9–10.3)
Creatinine, Ser: 0.96 mg/dL (ref 0.44–1.00)
GFR calc Af Amer: >60 mL/min (ref >60)
GFR calc non Af Amer: 54 mL/min — ABNORMAL LOW (ref >60)
Glucose, Bld: 127 mg/dL — ABNORMAL HIGH (ref 70–99)
POTASSIUM: 4.2 mmol/L (ref 3.5–5.1)
SODIUM: 136 mmol/L (ref 135–145)

## 2018-05-02 LAB — VANCOMYCIN, TROUGH: Vancomycin Tr: 11 ug/mL — ABNORMAL LOW (ref 15–20)

## 2018-05-02 MED ORDER — DOXYCYCLINE HYCLATE 100 MG PO TABS: 100.0000 mg | ORAL_TABLET | Freq: Two times a day (BID) | ORAL | 0 refills | Status: AC

## 2018-05-02 MED ORDER — TRAMADOL HCL 50 MG PO TABS: 50.0000 mg | ORAL_TABLET | Freq: Four times a day (QID) | ORAL | 0 refills | Status: DC | PRN

## 2018-05-02 NOTE — Progress Notes (Signed)
Pt D/C to home with son and grandson. IV removed intact by NT Angela. VSS. All belongings taken with her. Pt did claim she wanted pain medication stronger than Tramadol. Will contact MD to make aware.

## 2018-05-02 NOTE — Progress Notes (Signed)
Physical Therapy Treatment Patient Details Name: Monica [name] MRN: [id number] DOB: [birth year] Today's Date: 05/02/2018    History of Present Illness presented to ER after 'slip' from bed, unable to recover despite help from husband, noted with significant L LE redness/swelling; admitted for management of L LE cellulitis.  Of note, patient with mild elevation in troponin (peak 0.19), attributed to demand ischemia per notes.    PT Comments    Pt laying in bed beginning session this afternoon. She reports improvement in pain but continues to have 3-4/10 pain. Discussed benefit of gait training before discharges. She responded " you guys are pushing me out" I discussed with her that how did she plan to get into her house. She stated she preferred to use a SPC versus a RW. She got out of bed I, and required bed elevated to perform sit to stand. She practiced weight shifting to ease weight bearing through LLE. She ambulated 5 ft before opting to transition to the RW and ambulated 35 more ft before sitting in the recliner. Discussed she would possibly benefit from physical therapy and to potentially trial HHPT to promote safety and mobility at home, she stated she would be open it versus a SNF. No increase in pain noted at end of session.    Follow Up Recommendations  Home health PT(discuss HHPT pt stated she would be open to trying it)     Equipment Recommendations       Recommendations for Other Services       Precautions / Restrictions Restrictions Weight Bearing Restrictions: No    Mobility  Bed Mobility Overal bed mobility: Independent             General bed mobility comments: she was able to go from supine to sit independently  Transfers     Transfers: Sit to/from Stand Sit to Stand: Min assist;Mod assist         General transfer comment: she was able to perform sit to stand from EOB with bed elevated. pt did want attempt to reach with UE versus pushing up from the  bed. discussed proper form which she was resistant to with use of [facility]  Ambulation/Gait Ambulation/Gait assistance: Modified independent (Device/Increase time) Gait Distance (Feet): 40 Feet(initally 5 ft with SPC before transition to RW) Assistive device: Straight cane;Rolling walker (2 wheeled) Gait Pattern/deviations: Step-to pattern;Decreased stride length;Shuffle;Trendelenburg;Antalgic         Stairs             Wheelchair Mobility    Modified Rankin (Stroke Patients Only)       Balance                                            Cognition Arousal/Alertness: Awake/alert Behavior During Therapy: WFL for tasks assessed/performed Overall Cognitive Status: Within Functional Limits for tasks assessed                                        Exercises Other Exercises Other Exercises: standing lateral weight shifts before walking holding onto counter    General Comments        Pertinent Vitals/Pain Pain Assessment: 0-10 Pain Score: 4  Pain Location: L LE Pain Descriptors / Indicators: Aching;Sore Pain Intervention(s): Limited activity within patient's tolerance  Home Living                      Prior Function            PT Goals (current goals can now be found in the care plan section) Progress towards PT goals: Progressing toward goals    Frequency    Min 2X/week      PT Plan      Co-evaluation              AM-PAC PT "6 Clicks" Daily Activity  Outcome Measure  Difficulty turning over in bed (including adjusting bedclothes, sheets and blankets)?: A Little Difficulty moving from lying on back to sitting on the side of the bed? : A Little Difficulty sitting down on and standing up from a chair with arms (e.g., wheelchair, bedside commode, etc,.)?: A Little Help needed moving to and from a bed to chair (including a wheelchair)?: A Lot Help needed walking in hospital room?: A Lot Help needed  climbing 3-5 steps with a railing? : Total 6 Click Score: 14    End of Session Equipment Utilized During Treatment: Gait belt Activity Tolerance: Patient limited by pain;Patient limited by fatigue Patient left: in chair;with call bell/phone within reach;with chair alarm set;with nursing/sitter in room Nurse Communication: Mobility status Pain - Right/Left: Left Pain - part of body: Leg     Time: 1330-1407 PT Time Calculation (min) (ACUTE ONLY): 37 min  Charges:  $Gait Training: 8-22 mins $Therapeutic Activity: 8-22 mins                     Heidi Maclin PT, DPT, LAT, ATC  05/02/18  2:24 PM        Gerry Heaphy 05/02/2018, 2:20 PM

## 2018-05-02 NOTE — Progress Notes (Signed)
Pharmacy Antibiotic Note  Monica [name] is a 84 y.o. female admitted on 04/29/2018 with cellulitis.  Pharmacy has been consulted for vancomycin dosing.  Plan: EDP ordered Unasyn. Admitting wants to continue vancomycin and stop Unasyn. Vancomycin 1 gm IV x 1 in ED followed in approximately 6 hours (stacked dosing) by vancomycin 1.25 gm IV Q18H, predicted trough 15 mcg/ml. Pharmacy will continue to follow and adjust as needed to maintain trough 10 to 15 mcg/ml.   Vd 53.5 L, Ke 0.053 hr-1, T1/2 13.2 hr  Height: 5\' 4"  (162.6 cm) Weight: 253 lb 14.4 oz (115.2 kg) IBW/kg (Calculated) : 54.7  Temp (24hrs), Avg:98.3 F (36.8 C), Min:98 F (36.7 C), Max:98.6 F (37 C)  Recent Labs  Lab 04/29/18 1502 04/30/18 0145 05/01/18 0434 05/02/18 0336  WBC 17.8* 13.0*  --   --   CREATININE 0.90 0.86 0.92 0.96  VANCOTROUGH  --   --   --  11*    Estimated Creatinine Clearance: 56.3 mL/min (by C-G formula based on SCr of 0.96 mg/dL).    No Known Allergies  Antimicrobials this admission: Vancomycin 1 gm and Unasyn 3 gm in ED  Dose adjustments this admission: 9/21 Vanc level 11. Continue current regimen   Microbiology results:  BCx:   UCx:    Sputum:    MRSA PCR:   Thank you for allowing pharmacy to be a part of this patient's care.  Anjeanette Petzold S, Pharm.D., BCPS Clinical Pharmacist 05/02/2018 4:30 AM

## 2018-05-03 NOTE — Discharge Summary (Signed)
[facility] [place] [facility]   PATIENT NAME: Monica [name]    MR#:  [id number]  DATE OF BIRTH:  05/24/1934  DATE OF ADMISSION:  04/29/2018   ADMITTING PHYSICIAN: Houston SirenVivek J Sainani, MD  DATE OF DISCHARGE: 05/02/2018  3:08 PM  PRIMARY CARE PHYSICIAN: Patient, No Pcp Per   ADMISSION DIAGNOSIS:   Weakness [R53.1] Cellulitis, unspecified cellulitis site [L03.90]  DISCHARGE DIAGNOSIS:   Active Problems:   Cellulitis of left lower extremity   SECONDARY DIAGNOSIS:   Past Medical History:  Diagnosis Date  . Aortic stenosis    a. LHC 06/02/17: At least moderate aortic stenosis with a peak to peak gradient of 22 mmHg; b. TTE 05/2017: EF 55-60%, mild HK basal-midinferior wall, Gr1DD, mod to sev AS w/ mean gradient 21 mmHg, valve area 0.99, mild MR, mildly dilated LA   . CAD (coronary artery disease) 2018   a. inferior STEMI 06/02/2017: LHC 06/02/17: LM 20, D1 20%, o-pLCx 50, p-mRCA 100% s/p PCI/DES, mRCA 50, dRCA 30  . GI bleed    a. noted 06/05/2017  . HLD (hyperlipidemia)   . Obesity     HOSPITAL COURSE:   84 year old female with past medical history significant for aortic stenosis, CAD status post stents, hypertension and hyperlipidemia presents to hospital secondary to worsening left leg redness and pain.  1.  Left lower extremity cellulitis-secondary to open skin wounds from repeated scratching. -Started on IV antibiotics. -Advised to keep the leg elevated.  Being discharged on oral doxycycline. -CT of the leg done did not show any abscess or fluid collection.  2.  Elevated troponin-secondary to demand ischemia. -Echocardiogram showing moderate to severe aortic stenosis, normal EF. -Patient follows with liver cardiology as outpatient.  Continue outpatient follow-up  3.  CAD-stable.  Continue outpatient follow-up with cardiology.  On Plavix, Coreg and statin  4.  GERD-on Protonix  Worked with physical therapy.  They have recommended home  health.  Will be discharged home today  DISCHARGE CONDITIONS:   None  CONSULTS OBTAINED:   None  DRUG ALLERGIES:   No Known Allergies DISCHARGE MEDICATIONS:   Allergies as of 05/02/2018   No Known Allergies     Medication List    TAKE these medications   carvedilol 3.125 MG tablet Commonly known as:  COREG take 1 tablet by mouth twice a day with food   clopidogrel 75 MG tablet Commonly known as:  PLAVIX Take 1 tablet (75 mg total) by mouth daily.   doxycycline 100 MG tablet Commonly known as:  VIBRA-TABS Take 1 tablet (100 mg total) by mouth 2 (two) times daily for 10 days.   nitroGLYCERIN 0.4 MG SL tablet Commonly known as:  NITROSTAT Place 1 tablet (0.4 mg total) under the tongue every 5 (five) minutes as needed for chest pain.   pantoprazole 40 MG tablet Commonly known as:  PROTONIX Take 1 tablet (40 mg total) by mouth daily.   RA ASPIRIN ADULT LOW STRENGTH 81 MG chewable tablet Generic drug:  aspirin chew and swallow 1 tablet by mouth once daily What changed:  See the new instructions.   rosuvastatin 10 MG tablet Commonly known as:  CRESTOR Take 1 tablet (10 mg total) daily by mouth.   traMADol 50 MG tablet Commonly known as:  ULTRAM Take 1 tablet (50 mg total) by mouth every 6 (six) hours as needed.        DISCHARGE INSTRUCTIONS:   1.  PCP follow-up in 1 to 2 weeks 2.  Cardiology follow-up in 2 to 3 weeks  DIET:   Cardiac diet  ACTIVITY:   Activity as tolerated  OXYGEN:   Home Oxygen: No.  Oxygen Delivery: room air  DISCHARGE LOCATION:   home   If you experience worsening of your admission symptoms, develop shortness of breath, life threatening emergency, suicidal or homicidal thoughts you must seek medical attention immediately by calling 911 or calling your MD immediately  if symptoms less severe.  You Must read complete instructions/literature along with all the possible adverse reactions/side effects for all the Medicines you  take and that have been prescribed to you. Take any new Medicines after you have completely understood and accpet all the possible adverse reactions/side effects.   Please note  You were cared for by a hospitalist during your hospital stay. If you have any questions about your discharge medications or the care you received while you were in the hospital after you are discharged, you can call the unit and asked to speak with the hospitalist on call if the hospitalist that took care of you is not available. Once you are discharged, your primary care physician will handle any further medical issues. Please note that NO REFILLS for any discharge medications will be authorized once you are discharged, as it is imperative that you return to your primary care physician (or establish a relationship with a primary care physician if you do not have one) for your aftercare needs so that they can reassess your need for medications and monitor your lab values.    On the day of Discharge:  VITAL SIGNS:   Blood pressure 114/71, pulse 76, temperature 98.2 F (36.8 C), temperature source Oral, resp. rate 18, height 5\' 4"  (1.626 m), weight 115.2 kg, SpO2 95 %.  PHYSICAL EXAMINATION:    GENERAL:  84 y.o.-year-old elderly patient lying in the bed with no acute distress.  EYES: Pupils equal, round, reactive to light and accommodation. No scleral icterus. Extraocular muscles intact.  HEENT: Head atraumatic, normocephalic. Oropharynx and nasopharynx clear.  NECK:  Supple, no jugular venous distention. No thyroid enlargement, no tenderness.  LUNGS: Normal breath sounds bilaterally, no wheezing, rales,rhonchi or crepitation. No use of accessory muscles of respiration.  CARDIOVASCULAR: S1, S2 normal. No murmurs, rubs, or gallops.  ABDOMEN: Soft, non-tender, non-distended. Bowel sounds present. No organomegaly or mass.  EXTREMITIES: Swelling and erythema of left lower extremity noted.  Open scabby wounds noted from  scratching.  No pedal edema of right leg, no cyanosis, or clubbing.  NEUROLOGIC: Cranial nerves II through XII are intact. Muscle strength 5/5 in all extremities. Sensation intact. Gait not checked.  PSYCHIATRIC: The patient is alert and oriented x 3.  SKIN: No obvious rash, lesion, or ulcer.   DATA REVIEW:   CBC Recent Labs  Lab 04/30/18 0145  WBC 13.0*  HGB 11.6*  HCT 34.7*  PLT 203    Chemistries  Recent Labs  Lab 04/29/18 1502  05/02/18 0336  NA 132*   < > 136  K 3.7   < > 4.2  CL 97*   < > 105  CO2 25   < > 26  GLUCOSE 110*   < > 127*  BUN 17   < > 19  CREATININE 0.90   < > 0.96  CALCIUM 8.7*   < > 8.3*  AST 34  --   --   ALT 13  --   --   ALKPHOS 67  --   --   BILITOT  3.9*  --   --    < > = values in this interval not displayed.     Microbiology Results  Results for orders placed or performed during the hospital encounter of 06/04/17  Culture, blood (routine x 2)     Status: None   Collection Time: 06/05/17 12:41 AM  Result Value Ref Range Status   Specimen Description BLOOD LT HAND  Final   Special Requests   Final    BOTTLES DRAWN AEROBIC AND ANAEROBIC Blood Culture adequate volume   Culture NO GROWTH 5 DAYS  Final   Report Status 06/10/2017 FINAL  Final  Culture, blood (routine x 2)     Status: None   Collection Time: 06/05/17 12:41 AM  Result Value Ref Range Status   Specimen Description BLOOD RT HAND  Final   Special Requests   Final    BOTTLES DRAWN AEROBIC AND ANAEROBIC Blood Culture adequate volume   Culture NO GROWTH 5 DAYS  Final   Report Status 06/10/2017 FINAL  Final    RADIOLOGY:  No results found.   Management plans discussed with the patient, family and they are in agreement.  CODE STATUS:  Code Status History    Date Active Date Inactive Code Status Order ID Comments User Context   04/30/2018 1018 05/02/2018 1808 DNR [id number]  Adrian Saran, MD Inpatient   04/29/2018 1732 04/30/2018 1018 Full Code [id number]  Houston Siren, MD  Inpatient   06/05/2017 0521 06/07/2017 1404 Full Code [id number]  Ihor Austin, MD Inpatient   06/02/2017 0059 06/04/2017 1932 Full Code [id number]  Iran Ouch, MD Inpatient    Questions for Most Recent Historical Code Status (Order [id number])    Question Answer Comment   In the event of cardiac or respiratory ARREST Do not call a "code blue"    In the event of cardiac or respiratory ARREST Do not perform Intubation, CPR, defibrillation or ACLS    In the event of cardiac or respiratory ARREST Use medication by any route, position, wound care, and other measures to relive pain and suffering. May use oxygen, suction and manual treatment of airway obstruction as needed for comfort.       TOTAL TIME TAKING CARE OF THIS PATIENT: 38 minutes.    Enid Baas M.D on 05/03/2018 at 1:38 PM  Between 7am to 6pm - Pager - [phone]  After 6pm go to [email or web address] - Social research officer, government  [facility]  Hospitalists  Office  [phone]  CC: Primary care physician; Patient, No Pcp Per   Note: This dictation was prepared with Dragon dictation along with smaller phrase technology. Any transcriptional errors that result from this process are unintentional.

## 2018-05-05 ENCOUNTER — Telehealth: Payer: Self-pay

## 2018-05-05 NOTE — Telephone Encounter (Signed)
EMMI Follow-up: Noted on the report that the patient didn't know who to call if there was a change in her condition and had no follow-up appointment scheduled.  I called to talk with Mrs. Kassing and her husband said she was nappKarma Greasering as she is in a lot of pain and not sleeping very well.  Mrs. [name] does not have a PCP and I offered the [facility] number but he asked if I could call her back tomorrow and I told him I would.

## 2018-05-07 ENCOUNTER — Telehealth: Payer: Self-pay

## 2018-05-07 NOTE — Telephone Encounter (Signed)
EMMI Follow-up: Second call made to Monica [name] and her first choice of PCP would be Dr. Yetta Numbers but the referral line would be helpful too in case he wasn't taking new patients.  Number provided and she thanked me. Said she was doing pretty good but her legs were still swelling some but to be expected.  I let her know there would be a second automated call with a different series of other questions and to let us know if she had any other concerns at that time.

## 2018-06-29 ENCOUNTER — Telehealth: Payer: Self-pay | Admitting: Cardiovascular Disease

## 2018-06-29 NOTE — Telephone Encounter (Signed)
Patient calling stating she would like to know the echo results from when she was in the hospital   She did this on 04/30/18   Please call back

## 2018-06-30 NOTE — Telephone Encounter (Signed)
Echos showed normal EF and stable aortic stenosis.

## 2018-06-30 NOTE — Telephone Encounter (Signed)
Patient made aware of results and verbalized understanding.  

## 2018-06-30 NOTE — Telephone Encounter (Signed)
Pt advised that her Echo is available in her chart but since done in the hospital there is no note to us in the office as results to give her.. Will forward to Dr. Kirke CorinArida and his nurse for their review and we will let her know Dr. Jari SportsmanArida's recommendations if any.. I also made pt an appt 08/21/17 for her 6 month rov that is due 11/19.Marland Kitchen.Marland Kitchen.pt declined a sooner appt. Pt says that she is feeling well.

## 2018-07-29 ENCOUNTER — Other Ambulatory Visit: Payer: Self-pay | Admitting: Cardiovascular Disease

## 2018-07-29 MED ORDER — ROSUVASTATIN CALCIUM 10 MG PO TABS: 10.0000 mg | ORAL_TABLET | Freq: Every day | ORAL | 0 refills | Status: DC

## 2018-07-29 NOTE — Telephone Encounter (Signed)
°*  STAT* If patient is at the pharmacy, call can be transferred to refill team.   1. Which medications need to be refilled? (please list name of each medication and dose if known) rosuvastatin 10 mg po q d   2. Which pharmacy/location (including street and city if local pharmacy) is medication to be sent to? [facility] [place]   3. Do they need a 30 day or 90 day supply? 90

## 2018-08-03 ENCOUNTER — Other Ambulatory Visit: Payer: Self-pay

## 2018-08-03 MED ORDER — ROSUVASTATIN CALCIUM 10 MG PO TABS: 10.0000 mg | ORAL_TABLET | Freq: Every day | ORAL | 0 refills | Status: DC

## 2018-08-17 ENCOUNTER — Telehealth: Payer: Self-pay | Admitting: Cardiovascular Disease

## 2018-08-17 NOTE — Telephone Encounter (Signed)
Spoke with patient to reschedule 1/10 ov with Dr. Kirke Corin due to schedule change.  Patient declines sooner appt with APP . Schedule next available in March with Kirke Corin

## 2018-08-21 ENCOUNTER — Ambulatory Visit: Payer: Medicare Other | Admitting: Cardiovascular Disease

## 2018-08-27 ENCOUNTER — Ambulatory Visit: Payer: Medicare Other | Admitting: Physician Assistant

## 2018-10-22 NOTE — Progress Notes (Signed)
Cardiology Office Note   Date:  10/23/2018   ID:  Monica [name], DOB Sep 09, 1933, MRN [id number]  PCP:  Patient, No Pcp Per  Cardiologist:  Lorine Bears, MD   Chief Complaint  Patient presents with  . other    6 month f/u no complaints today. Meds reviewed verbally with pt.      History of Present Illness: Monica [name] is a 85 y.o. female who presents for a follow-up visit regarding coronary artery disease.  The patient has prolonged history of tobacco use and presented in October 2018 with acute inferior ST elevation myocardial infarction.  This was complicated by hypotension and bradycardia.  She underwent successful angioplasty and drug-eluting stent placement to the proximal right coronary artery.  She was noted to have moderate aortic stenosis.  Echo showed an EF of 50-60%.  Aortic valve mean gradient was 21 mmHg with a valve area of 0.99. She was rehospitalized in 2018 with upper GI bleed with a hemoglobin of 9.9 which decreased to 7.9.  She underwent blood transfusion.  Brilinta was switched to Plavix.  The patient was seen by GI who felt that she was too high risk for endoscopic procedures. Quit smoking since her myocardial infarction. She was hospitalized in September of last year with lower extremity cellulitis that was treated with antibiotics.  She had mildly elevated troponin thought to be due to supply demand ischemia.  She had an echocardiogram done which showed normal LV systolic function with stable moderate aortic stenosis with a mean gradient of 22 mmHg and valve area of 0.99.  The patient is doing well today. Sometimes her blood pressure is high. She has shortness of breath with exertion. Denies dizziness or getting light-headed. She quit taking clopidogrel in November 2019. She quit smoking in 2018. She is tolerating the Crestor and needs refills. She has been having headaches recently. She is taking aspirin 81 mg. She denies chest pain, palpitations or any other  related symptoms or complaints at this time.    Past Medical History:  Diagnosis Date  . Aortic stenosis    a. LHC 06/02/17: At least moderate aortic stenosis with a peak to peak gradient of 22 mmHg; b. TTE 05/2017: EF 55-60%, mild HK basal-midinferior wall, Gr1DD, mod to sev AS w/ mean gradient 21 mmHg, valve area 0.99, mild MR, mildly dilated LA   . CAD (coronary artery disease) 2018   a. inferior STEMI 06/02/2017: LHC 06/02/17: LM 20, D1 20%, o-pLCx 50, p-mRCA 100% s/p PCI/DES, mRCA 50, dRCA 30  . GI bleed    a. noted 06/05/2017  . HLD (hyperlipidemia)   . Obesity     Past Surgical History:  Procedure Laterality Date  . ANKLE RECONSTRUCTION  1956   also ORIF of right arm  . CORONARY STENT INTERVENTION N/A 06/01/2017   Procedure: Coronary/Graft Acute MI Revascularization;  Surgeon: Iran Ouch, MD;  Location: [facility] INVASIVE CV LAB;  Service: Cardiovascular;  Laterality: N/A;  . LEFT HEART CATH AND CORONARY ANGIOGRAPHY N/A 06/01/2017   Procedure: LEFT HEART CATH AND CORONARY ANGIOGRAPHY;  Surgeon: Iran Ouch, MD;  Location: [facility] INVASIVE CV LAB;  Service: Cardiovascular;  Laterality: N/A;     Current Outpatient Medications  Medication Sig Dispense Refill  . carvedilol (COREG) 3.125 MG tablet take 1 tablet by mouth twice a day with food 180 tablet 3  . clopidogrel (PLAVIX) 75 MG tablet Take 1 tablet (75 mg total) by mouth daily. 90 tablet 11  . nitroGLYCERIN (NITROSTAT)  0.4 MG SL tablet Place 1 tablet (0.4 mg total) under the tongue every 5 (five) minutes as needed for chest pain. 30 tablet 2  . pantoprazole (PROTONIX) 40 MG tablet Take 1 tablet (40 mg total) by mouth daily. 90 tablet 3  . RA ASPIRIN ADULT LOW STRENGTH 81 MG chewable tablet chew and swallow 1 tablet by mouth once daily (Patient taking differently: Chew 81 mg by mouth daily. ) 90 tablet 2  . rosuvastatin (CRESTOR) 10 MG tablet Take 1 tablet (10 mg total) by mouth daily. 90 tablet 0  . traMADol (ULTRAM) 50  MG tablet Take 1 tablet (50 mg total) by mouth every 6 (six) hours as needed. 20 tablet 0   No current facility-administered medications for this visit.     Allergies:   Patient has no known allergies.    Social History:  The patient  reports that she quit smoking about 16 months ago. Her smoking use included cigarettes. She has a 25.00 pack-year smoking history. She has never used smokeless tobacco. She reports that she does not drink alcohol or use drugs.   Family History:  The patient's family history includes Diabetes Mellitus II in her daughter; Valvular heart disease in her mother.    ROS:  Please see the history of present illness.   Otherwise, review of systems are positive for none.   All other systems are reviewed and negative.    PHYSICAL EXAM: VS:  Ht 5\' 5"  (1.651 m)   Wt 251 lb (113.9 kg)   BMI 41.77 kg/m  , BMI Body mass index is 41.77 kg/m. GEN: Well nourished, well developed, in no acute distress  HEENT: normal  Neck: no JVD, carotid bruits, or masses Cardiac: RRR; no  rubs, or gallops.  Mild bilateral leg edema.  3 / 6 crescendo decrescendo systolic murmur in the aortic area which is late peaking Respiratory:  clear to auscultation bilaterally, normal work of breathing GI: soft, nontender, nondistended, + BS MS: no deformity or atrophy  Skin: warm and dry, no rash Neuro:  Strength and sensation are intact Psych: euthymic mood, full affect   EKG:  EKG is ordered today. The ekg ordered today demonstrates normal sinus rhythm with low voltage and old inferior infarct.   Recent Labs: 04/29/2018: ALT 13 04/30/2018: Hemoglobin 11.6; Platelets 203 05/02/2018: BUN 19; Creatinine, Ser 0.96; Potassium 4.2; Sodium 136    Lipid Panel    Component Value Date/Time   CHOL 143 12/23/2017 0927   TRIG 95 12/23/2017 0927   HDL 58 12/23/2017 0927   CHOLHDL 2.5 12/23/2017 0927   VLDL 19 12/23/2017 0927   LDLCALC 66 12/23/2017 0927      Wt Readings from Last 3  Encounters:  10/23/18 251 lb (113.9 kg)  04/29/18 253 lb 14.4 oz (115.2 kg)  12/26/17 246 lb 8 oz (111.8 kg)      No flowsheet data found.    ASSESSMENT AND PLAN:  1.  Coronary artery disease involving native coronary arteries without angina: She is doing very well overall with no anginal symptoms.    2.  Moderate aortic stenosis: This has been stable on most recent echocardiogram in September.  I am going to repeat echocardiogram in September of this year.  3.  Hyperlipidemia: She did not tolerate high-dose atorvastatin due to GI symptoms.   She is tolerating rosuvastatin.   Follow-up lipid profile showed an LDL of 66.  4.  Essential hypertension: Blood pressure is mildly elevated.  We can consider  increasing carvedilol if blood pressure continues to be elevated.   Disposition:   FU with me in 6 months  I, Jesus Reyes am acting as a Neurosurgeonscribe for Lorine BearsMuhammad Macey Wurtz, M.D.  I have reviewed the above documentation for accuracy and completeness, and I agree with the above.    Signed, Lorine BearsMuhammad Brooklin Rieger, MD 10/23/18 [facility] [place] [phone]

## 2018-10-23 ENCOUNTER — Encounter: Payer: Self-pay | Admitting: Cardiovascular Disease

## 2018-10-23 ENCOUNTER — Other Ambulatory Visit: Payer: Self-pay

## 2018-10-23 ENCOUNTER — Ambulatory Visit: Payer: Medicare Other | Admitting: Cardiovascular Disease

## 2018-10-23 VITALS — BP 144/92 | HR 83 | Ht 65.0 in | Wt 251.0 lb

## 2018-10-23 DIAGNOSIS — I2510 Atherosclerotic heart disease of native coronary artery without angina pectoris: Secondary | ICD-10-CM

## 2018-10-23 DIAGNOSIS — E785 Hyperlipidemia, unspecified: Secondary | ICD-10-CM | POA: Diagnosis not present

## 2018-10-23 DIAGNOSIS — I350 Nonrheumatic aortic (valve) stenosis: Secondary | ICD-10-CM

## 2018-10-23 DIAGNOSIS — I10 Essential (primary) hypertension: Secondary | ICD-10-CM | POA: Diagnosis not present

## 2018-10-23 MED ORDER — CARVEDILOL 3.125 MG PO TABS: 3.1250 mg | ORAL_TABLET | Freq: Two times a day (BID) | ORAL | 1 refills | Status: DC

## 2018-10-23 MED ORDER — ROSUVASTATIN CALCIUM 10 MG PO TABS: 10.0000 mg | ORAL_TABLET | Freq: Every day | ORAL | 1 refills | Status: DC

## 2018-10-23 NOTE — Patient Instructions (Addendum)
Medication Instructions:  No changes If you need a refill on your cardiac medications before your next appointment, please call your pharmacy.   Lab work: None ordered  Testing/Procedures: Your physician has requested that you have an echocardiogram in 6 months. Echocardiography is a painless test that uses sound waves to create images of your heart. It provides your doctor with information about the size and shape of your heart and how well your heart's chambers and valves are working. You may receive an ultrasound enhancing agent through an IV if needed to better visualize your heart during the echo.This procedure takes approximately one hour. There are no restrictions for this procedure. This will take place at the [facility] clinic.    Follow-Up: At [facility], you and your health needs are our priority.  As part of our continuing mission to provide you with exceptional heart care, we have created designated Provider Care Teams.  These Care Teams include your primary Cardiologist (physician) and Advanced Practice Providers (APPs -  Physician Assistants and Nurse Practitioners) who all work together to provide you with the care you need, when you need it. You will need a follow up appointment in 6 months after the echo.  Please call our office 2 months in advance to schedule this appointment.  You may see Dr. Kirke Corin or one of the following Advanced Practice Providers on your designated Care Team:   Nicolasa Ducking, NP Eula Listen, PA-C . Marisue Ivan, PA-C

## 2019-05-12 ENCOUNTER — Other Ambulatory Visit: Payer: Self-pay | Admitting: Cardiovascular Disease

## 2019-05-14 ENCOUNTER — Telehealth: Payer: Self-pay | Admitting: Cardiovascular Disease

## 2019-05-14 ENCOUNTER — Other Ambulatory Visit: Payer: Self-pay

## 2019-05-14 MED ORDER — ROSUVASTATIN CALCIUM 10 MG PO TABS: 10.0000 mg | ORAL_TABLET | Freq: Every day | ORAL | 0 refills | Status: DC

## 2019-05-14 MED ORDER — CARVEDILOL 3.125 MG PO TABS: 3.1250 mg | ORAL_TABLET | Freq: Two times a day (BID) | ORAL | 1 refills | Status: DC

## 2019-05-14 NOTE — Telephone Encounter (Signed)
°*  STAT* If patient is at the pharmacy, call can be transferred to refill team.   1. Which medications need to be refilled? (please list name of each medication and dose if known)  Rosuvastatin 10 MG 1 tablet daily  carvedilol 3.125 MG  1 tablet 2 times daily   2. Which pharmacy/location (including street and city if local pharmacy) is medication to be sent to? [facility] on [address]   3. Do they need a 30 day or 90 day supply? 90 day

## 2019-05-14 NOTE — Telephone Encounter (Signed)
Refills sent

## 2019-05-14 NOTE — Telephone Encounter (Signed)
Virtual Visit Pre-Appointment Phone Call  "(Name), I am calling you today to discuss your upcoming appointment. We are currently trying to limit exposure to the virus that causes COVID-19 by seeing patients at home rather than in the office."  1. "What is the BEST phone number to call the day of the visit?" - include this in appointment notes  2. Do you have or have access to (through a family member/friend) a smartphone with video capability that we can use for your visit?" a. If yes - list this number in appt notes as cell (if different from BEST phone #) and list the appointment type as a VIDEO visit in appointment notes b. If no - list the appointment type as a PHONE visit in appointment notes  3. Confirm consent - "In the setting of the current Covid19 crisis, you are scheduled for a (phone or video) visit with your provider on (date) at (time).  Just as we do with many in-office visits, in order for you to participate in this visit, we must obtain consent.  If you'd like, I can send this to your mychart (if signed up) or email for you to review.  Otherwise, I can obtain your verbal consent now.  All virtual visits are billed to your insurance company just like a normal visit would be.  By agreeing to a virtual visit, we'd like you to understand that the technology does not allow for your provider to perform an examination, and thus may limit your provider's ability to fully assess your condition. If your provider identifies any concerns that need to be evaluated in person, we will make arrangements to do so.  Finally, though the technology is pretty good, we cannot assure that it will always work on either your or our end, and in the setting of a video visit, we may have to convert it to a phone-only visit.  In either situation, we cannot ensure that we have a secure connection.  Are you willing to proceed?" STAFF: Did the patient verbally acknowledge consent to telehealth visit? Document  YES/NO here: YES  4. Advise patient to be prepared - "Two hours prior to your appointment, go ahead and check your blood pressure, pulse, oxygen saturation, and your weight (if you have the equipment to check those) and write them all down. When your visit starts, your provider will ask you for this information. If you have an Apple Watch or Kardia device, please plan to have heart rate information ready on the day of your appointment. Please have a pen and paper handy nearby the day of the visit as well."  5. Give patient instructions for MyChart download to smartphone OR Doximity/Doxy.me as below if video visit (depending on what platform provider is using)  6. Inform patient they will receive a phone call 15 minutes prior to their appointment time (may be from unknown caller ID) so they should be prepared to answer    TELEPHONE CALL NOTE  Monica [name] has been deemed a candidate for a follow-up tele-health visit to limit community exposure during the Covid-19 pandemic. I spoke with the patient via phone to ensure availability of phone/video source, confirm preferred email & phone number, and discuss instructions and expectations.  I reminded Monica [name] to be prepared with any vital sign and/or heart rhythm information that could potentially be obtained via home monitoring, at the time of her visit. I reminded Monica [name] to expect a phone call prior to her visit.  Monica [name] 05/14/2019 11:32 AM   INSTRUCTIONS FOR DOWNLOADING THE MYCHART APP TO SMARTPHONE  - The patient must first make sure to have activated MyChart and know their login information - If Apple, go to [employer] and type in MyChart in the search bar and download the app. If Android, ask patient to go to [employer] and type in [place] in the search bar and download the app. The app is free but as with any other app downloads, their phone may require them to verify saved payment information or Apple/Android  password.  - The patient will need to then log into the app with their MyChart username and password, and select [facility] as their healthcare provider to link the account. When it is time for your visit, go to the MyChart app, find appointments, and click Begin Video Visit. Be sure to Select Allow for your device to access the Microphone and Camera for your visit. You will then be connected, and your provider will be with you shortly.  **If they have any issues connecting, or need assistance please contact MyChart service desk [phone])**  **If using a computer, in order to ensure the best quality for their visit they will need to use either of the following Internet Browsers: [employer] or Google Chrome**  IF USING DOXIMITY or DOXY.ME - The patient will receive a link just prior to their visit by text.     FULL LENGTH CONSENT FOR TELE-HEALTH VISIT   I hereby voluntarily request, consent and authorize [facility] and its employed or contracted physicians, physician assistants, nurse practitioners or other licensed health care professionals (the Practitioner), to provide me with telemedicine health care services (the Services") as deemed necessary by the treating Practitioner. I acknowledge and consent to receive the Services by the Practitioner via telemedicine. I understand that the telemedicine visit will involve communicating with the Practitioner through live audiovisual communication technology and the disclosure of certain medical information by electronic transmission. I acknowledge that I have been given the opportunity to request an in-person assessment or other available alternative prior to the telemedicine visit and am voluntarily participating in the telemedicine visit.  I understand that I have the right to withhold or withdraw my consent to the use of telemedicine in the course of my care at any time, without affecting my right to future care or treatment,  and that the Practitioner or I may terminate the telemedicine visit at any time. I understand that I have the right to inspect all information obtained and/or recorded in the course of the telemedicine visit and may receive copies of available information for a reasonable fee.  I understand that some of the potential risks of receiving the Services via telemedicine include:   Delay or interruption in medical evaluation due to technological equipment failure or disruption;  Information transmitted may not be sufficient (e.g. poor resolution of images) to allow for appropriate medical decision making by the Practitioner; and/or   In rare instances, security protocols could fail, causing a breach of personal health information.  Furthermore, I acknowledge that it is my responsibility to provide information about my medical history, conditions and care that is complete and accurate to the best of my ability. I acknowledge that Practitioner's advice, recommendations, and/or decision may be based on factors not within their control, such as incomplete or inaccurate data provided by me or distortions of diagnostic images or specimens that may result from electronic transmissions. I understand that the practice  of medicine is not an Chief Strategy Officer and that Practitioner makes no warranties or guarantees regarding treatment outcomes. I acknowledge that I will receive a copy of this consent concurrently upon execution via email to the email address I last provided but may also request a printed copy by calling the office of [place].    I understand that my insurance will be billed for this visit.   I have read or had this consent read to me.  I understand the contents of this consent, which adequately explains the benefits and risks of the Services being provided via telemedicine.   I have been provided ample opportunity to ask questions regarding this consent and the Services and have had my questions  answered to my satisfaction.  I give my informed consent for the services to be provided through the use of telemedicine in my medical care  By participating in this telemedicine visit I agree to the above.

## 2019-05-27 ENCOUNTER — Other Ambulatory Visit: Payer: Self-pay | Admitting: Cardiovascular Disease

## 2019-05-27 ENCOUNTER — Encounter: Payer: Self-pay | Admitting: Cardiovascular Disease

## 2019-05-27 ENCOUNTER — Other Ambulatory Visit: Payer: Self-pay

## 2019-05-27 ENCOUNTER — Telehealth (INDEPENDENT_AMBULATORY_CARE_PROVIDER_SITE_OTHER): Payer: Medicare Other | Admitting: Cardiovascular Disease

## 2019-05-27 VITALS — BP 150/90 | Ht 65.0 in | Wt 240.0 lb

## 2019-05-27 DIAGNOSIS — I2510 Atherosclerotic heart disease of native coronary artery without angina pectoris: Secondary | ICD-10-CM | POA: Diagnosis not present

## 2019-05-27 DIAGNOSIS — I350 Nonrheumatic aortic (valve) stenosis: Secondary | ICD-10-CM

## 2019-05-27 DIAGNOSIS — I10 Essential (primary) hypertension: Secondary | ICD-10-CM

## 2019-05-27 DIAGNOSIS — E785 Hyperlipidemia, unspecified: Secondary | ICD-10-CM

## 2019-05-27 MED ORDER — CARVEDILOL 3.125 MG PO TABS: 3.1250 mg | ORAL_TABLET | Freq: Two times a day (BID) | ORAL | 1 refills | Status: DC

## 2019-05-27 MED ORDER — ROSUVASTATIN CALCIUM 10 MG PO TABS: 10.0000 mg | ORAL_TABLET | Freq: Every day | ORAL | 3 refills | Status: DC

## 2019-05-27 MED ORDER — ROSUVASTATIN CALCIUM 10 MG PO TABS: 10.0000 mg | ORAL_TABLET | Freq: Every day | ORAL | 1 refills | Status: DC

## 2019-05-27 MED ORDER — CARVEDILOL 3.125 MG PO TABS: 3.1250 mg | ORAL_TABLET | Freq: Two times a day (BID) | ORAL | 3 refills | Status: DC

## 2019-05-27 NOTE — Telephone Encounter (Signed)
Requested Prescriptions   Signed Prescriptions Disp Refills  . rosuvastatin (CRESTOR) 10 MG tablet 90 tablet 1    Sig: Take 1 tablet (10 mg total) by mouth daily.    Authorizing Provider: Kathlyn Sacramento A    Ordering User: Raelene Bott, Ryosuke Ericksen L  . carvedilol (COREG) 3.125 MG tablet 180 tablet 1    Sig: Take 1 tablet (3.125 mg total) by mouth 2 (two) times daily with a meal.    Authorizing Provider: Kathlyn Sacramento A    Ordering User: Raelene Bott, Etheleen Valtierra L

## 2019-05-27 NOTE — Patient Instructions (Signed)

## 2019-05-27 NOTE — Telephone Encounter (Signed)
°*  STAT* If patient is at the pharmacy, call can be transferred to refill team.   1. Which medications need to be refilled? (please list name of each medication and dose if known)  Carvedilol   Rosuvastatin   2. Which pharmacy/location (including street and city if local pharmacy) is medication to be sent to?   [place]    3. Do they need a 30 day or 90 day supply? [place]

## 2019-05-27 NOTE — Progress Notes (Signed)
Virtual Visit via Telephone Note   This visit type was conducted due to national recommendations for restrictions regarding the COVID-19 Pandemic (e.g. social distancing) in an effort to limit this patient's exposure and mitigate transmission in our community.  Due to her co-morbid illnesses, this patient is at least at moderate risk for complications without adequate follow up.  This format is felt to be most appropriate for this patient at this time.  The patient did not have access to video technology/had technical difficulties with video requiring transitioning to audio format only (telephone).  All issues noted in this document were discussed and addressed.  No physical exam could be performed with this format.  Please refer to the patient's chart for her  consent to telehealth for [facility].   Date:  05/27/2019   ID:  Monica ArmourSylvia [name], DOB 1933/09/03, MRN [id number]  Patient Location: Home Provider Location: Office  PCP:  Patient, No Pcp Per  Cardiologist:  Lorine BearsMuhammad Arida, MD  Electrophysiologist:  None   Evaluation Performed:  Follow-Up Visit  Chief Complaint:   Doing well  History of Present Illness:    Monica ArmourSylvia [name] is a 85 y.o. female was reached via phone for follow-up visit regarding coronary artery disease and aortic stenosis.  The patient has prolonged history of tobacco use and presented in October 2018 with acute inferior ST elevation myocardial infarction.  This was complicated by hypotension and bradycardia.  She underwent successful angioplasty and drug-eluting stent placement to the proximal right coronary artery.  She was noted to have moderate aortic stenosis.  Echo showed an EF of 50-60%.  Aortic valve mean gradient was 21 mmHg with a valve area of 0.99. She was rehospitalized in 2018 with upper GI bleed with a hemoglobin of 9.9 which decreased to 7.9.  She underwent blood transfusion.  Brilinta was switched to Plavix.   Quit smoking since her myocardial  infarction.  Most recent echocardiogram in September 2019 showed an EF of 60 to 65% with moderate aortic stenosis.  Mean gradient was 22 mmHg with a valve area of 0.99 and mild pulmonary hypertension.  He has been doing well with no recent chest pain.  She reports stable exertional dyspnea.  She ran out of rosuvastatin and carvedilol few weeks ago and did not go to the pharmacy to pick up the medications.  We did send refills earlier this month.   The patient does not have symptoms concerning for COVID-19 infection (fever, chills, cough, or new shortness of breath).    Past Medical History:  Diagnosis Date  . Aortic stenosis    a. LHC 06/02/17: At least moderate aortic stenosis with a peak to peak gradient of 22 mmHg; b. TTE 05/2017: EF 55-60%, mild HK basal-midinferior wall, Gr1DD, mod to sev AS w/ mean gradient 21 mmHg, valve area 0.99, mild MR, mildly dilated LA   . CAD (coronary artery disease) 2018   a. inferior STEMI 06/02/2017: LHC 06/02/17: LM 20, D1 20%, o-pLCx 50, p-mRCA 100% s/p PCI/DES, mRCA 50, dRCA 30  . GI bleed    a. noted 06/05/2017  . HLD (hyperlipidemia)   . Obesity    Past Surgical History:  Procedure Laterality Date  . ANKLE RECONSTRUCTION  1956   also ORIF of right arm  . CORONARY STENT INTERVENTION N/A 06/01/2017   Procedure: Coronary/Graft Acute MI Revascularization;  Surgeon: Iran OuchArida, Muhammad A, MD;  Location: [facility] INVASIVE CV LAB;  Service: Cardiovascular;  Laterality: N/A;  . LEFT HEART CATH AND CORONARY ANGIOGRAPHY  N/A 06/01/2017   Procedure: LEFT HEART CATH AND CORONARY ANGIOGRAPHY;  Surgeon: Wellington Hampshire, MD;  Location: [place] CV LAB;  Service: Cardiovascular;  Laterality: N/A;     Current Meds  Medication Sig  . aspirin (ASPIRIN 81) 81 MG EC tablet Take 81 mg by mouth daily. Swallow whole.  . carvedilol (COREG) 3.125 MG tablet Take 1 tablet (3.125 mg total) by mouth 2 (two) times daily with a meal.  . nitroGLYCERIN (NITROSTAT) 0.4 MG SL  tablet Place 1 tablet (0.4 mg total) under the tongue every 5 (five) minutes as needed for chest pain.  . rosuvastatin (CRESTOR) 10 MG tablet Take 1 tablet (10 mg total) by mouth daily.     Allergies:   Patient has no known allergies.   Social History   Tobacco Use  . Smoking status: Former Smoker    Packs/day: 0.50    Years: 50.00    Pack years: 25.00    Types: Cigarettes    Quit date: 06/01/2017    Years since quitting: 1.9  . Smokeless tobacco: Never Used  Substance Use Topics  . Alcohol use: No  . Drug use: No     Family Hx: The patient's family history includes Diabetes Mellitus II in her daughter; Valvular heart disease in her mother.  ROS:   Please see the history of present illness.     All other systems reviewed and are negative.   Prior CV studies:   The following studies were reviewed today:  Echocardiogram in 2019 discussed with the patient as outlined above  Labs/Other Tests and Data Reviewed:    EKG:  No ECG reviewed.  Recent Labs: No results found for requested labs within last 8760 hours.   Recent Lipid Panel Lab Results  Component Value Date/Time   CHOL 143 12/23/2017 09:27 AM   TRIG 95 12/23/2017 09:27 AM   HDL 58 12/23/2017 09:27 AM   CHOLHDL 2.5 12/23/2017 09:27 AM   LDLCALC 66 12/23/2017 09:27 AM    Wt Readings from Last 3 Encounters:  05/27/19 240 lb (108.9 kg)  10/23/18 251 lb (113.9 kg)  04/29/18 253 lb 14.4 oz (115.2 kg)     Objective:    Vital Signs:  BP (!) 150/90   Ht 5\' 5"  (1.651 m)   Wt 240 lb (108.9 kg)   BMI 39.94 kg/m    VITAL SIGNS:  reviewed  ASSESSMENT & PLAN:    1.  Coronary artery disease involving native coronary arteries without angina: She is doing very well overall with no anginal symptoms.    2.  Moderate aortic stenosis: This has been stable on most recent echocardiogram in September of 2019.  I requested a repeat echocardiogram.  3.  Hyperlipidemia: She did not tolerate high-dose atorvastatin due  to GI symptoms.   She is tolerating rosuvastatin.  I instructed her to go get her refills from her pharmacy as she ran out 2 weeks ago.  Most recent LDL was 66.  4.  Essential hypertension: Blood pressure is mildly elevated but she has not taken carvedilol in few weeks and she will get her refills today.   COVID-19 Education: The signs and symptoms of COVID-19 were discussed with the patient and how to seek care for testing (follow up with PCP or arrange E-visit).  The importance of social distancing was discussed today.  Time:   Today, I have spent 8 minutes with the patient with telehealth technology discussing the above problems.     Medication  Adjustments/Labs and Tests Ordered: Current medicines are reviewed at length with the patient today.  Concerns regarding medicines are outlined above.   Tests Ordered: No orders of the defined types were placed in this encounter.   Medication Changes: No orders of the defined types were placed in this encounter.   Follow Up:  In Person in 6 month(s)  Signed, Lorine Bears, MD  05/27/2019 9:33 AM    [facility]

## 2019-08-20 ENCOUNTER — Telehealth: Payer: Self-pay | Admitting: Cardiovascular Disease

## 2019-08-20 NOTE — Telephone Encounter (Signed)
Returned the patient's call. Patient sts that she has always has arthritic pain in her legs and would take Ibuprofen for relief. She was told that she  Should no longer take NSAIDs and has been taking Tylenol for pain. Pt sts that Tylenol is not relieving the pain. She would like Dr. Jari Sportsman recommendation on what she can take.  She does not have pcp. Recommended that she try OTC voltaren topical. She prefers to have Dr. Jari Sportsman recommendation and wonders if he could possible prescribe something for pain.  Advised the patient that we do not normally prescribe non-cardiac medications. I will fwd the message to Dr. Kirke Corin and call back with his response.  Pt verbalized understanding and voiced appreciation for the call back.

## 2019-08-20 NOTE — Telephone Encounter (Signed)
Pt c/o medication issue:  1. Name of Medication: OTC or RX or leg Pain   2. How are you currently taking this medication (dosage and times per day)?  na  3. Are you having a reaction (difficulty breathing--STAT)? no  4. What is your medication issue? Patient wants to know what she can take now considering cardiac issues that she can take otc or rx .  Patient previously took motrin and was told not to take due to card meds.  Patient taking tylenol and it is not working.

## 2019-08-23 NOTE — Telephone Encounter (Signed)
I agree with over-the-counter Voltaren topical and extra strength Tylenol.  These are probably the safest options.  We should find a primary care physician for her.

## 2019-08-24 NOTE — Telephone Encounter (Signed)
Spoke with the patient and made her aware of Dr. Jari Sportsman response and recommendation. Patient sts that Tylenol has not worked for her in the past. She is currently possibly try the voltaren gel in the future of needed. Offered the patient that telephone number for [facility] to help her establish care with a pcp. Patient sts that she already has a pcp in mind. She will call back for the [facility] number if she is unable to get in with that pcp's office.  Patient voice appreciation for the call.

## 2019-10-25 ENCOUNTER — Other Ambulatory Visit: Payer: Medicare Other

## 2019-10-26 ENCOUNTER — Other Ambulatory Visit: Payer: Self-pay

## 2019-10-26 ENCOUNTER — Ambulatory Visit (INDEPENDENT_AMBULATORY_CARE_PROVIDER_SITE_OTHER): Payer: Medicare Other

## 2019-10-26 DIAGNOSIS — I350 Nonrheumatic aortic (valve) stenosis: Secondary | ICD-10-CM | POA: Diagnosis not present

## 2019-10-28 ENCOUNTER — Telehealth: Payer: Self-pay

## 2019-10-28 NOTE — Telephone Encounter (Signed)
-----   Message from Iran Ouch, MD sent at 10/27/2019  3:48 PM EDT ----- Normal ejection fraction.  However, aortic stenosis is worse than before.  Schedule a follow-up appointment to discuss.

## 2019-10-28 NOTE — Telephone Encounter (Signed)
Patient made aware of echo results and Dr. Jari Sportsman recommendation. She will need an appt after 3:30 pm since she rely's on her grandson for transportation. Appt sch with Dr. Kirke Corin 11/16/19 @ 4:40pm. Advised the pt to contact the office sooner if she develop chest pain, sob, pre-syncope or syncope. Patient verbalized understanding.

## 2019-11-16 ENCOUNTER — Ambulatory Visit (INDEPENDENT_AMBULATORY_CARE_PROVIDER_SITE_OTHER): Payer: Medicare Other | Admitting: Cardiovascular Disease

## 2019-11-16 ENCOUNTER — Encounter: Payer: Self-pay | Admitting: Cardiovascular Disease

## 2019-11-16 ENCOUNTER — Other Ambulatory Visit: Payer: Self-pay

## 2019-11-16 VITALS — BP 136/86 | HR 81 | Ht 65.0 in | Wt 240.0 lb

## 2019-11-16 DIAGNOSIS — I10 Essential (primary) hypertension: Secondary | ICD-10-CM

## 2019-11-16 DIAGNOSIS — I350 Nonrheumatic aortic (valve) stenosis: Secondary | ICD-10-CM

## 2019-11-16 DIAGNOSIS — I2510 Atherosclerotic heart disease of native coronary artery without angina pectoris: Secondary | ICD-10-CM

## 2019-11-16 DIAGNOSIS — E785 Hyperlipidemia, unspecified: Secondary | ICD-10-CM | POA: Diagnosis not present

## 2019-11-16 NOTE — Patient Instructions (Signed)
Medication Instructions:  Your physician recommends that you continue on your current medications as directed. Please refer to the Current Medication list given to you today.  *If you need a refill on your cardiac medications before your next appointment, please call your pharmacy*   Lab Work: None ordered If you have labs (blood work) drawn today and your tests are completely normal, you will receive your results only by: . MyChart Message (if you have MyChart) OR . A paper copy in the mail If you have any lab test that is abnormal or we need to change your treatment, we will call you to review the results.   Testing/Procedures: None ordered   Follow-Up: At [facility], you and your health needs are our priority.  As part of our continuing mission to provide you with exceptional heart care, we have created designated Provider Care Teams.  These Care Teams include your primary Cardiologist (physician) and Advanced Practice Providers (APPs -  Physician Assistants and Nurse Practitioners) who all work together to provide you with the care you need, when you need it.  We recommend signing up for the patient portal called "MyChart".  Sign up information is provided on this After Visit Summary.  MyChart is used to connect with patients for Virtual Visits (Telemedicine).  Patients are able to view lab/test results, encounter notes, upcoming appointments, etc.  Non-urgent messages can be sent to your provider as well.   To learn more about what you can do with MyChart, go to [email or web address].    Your next appointment:   3 month(s)  The format for your next appointment:   In Person  Provider:    You may see Muhammad Arida, MD or one of the following Advanced Practice Providers on your designated Care Team:    Christopher Berge, NP  Ryan Dunn, PA-C  Jacquelyn Visser, PA-C    Other Instructions N/A  

## 2019-11-16 NOTE — Progress Notes (Signed)
Cardiology Office Note   Date:  11/16/2019   ID:  Monica [name], DOB 11-20-1933, MRN [id number]  PCP:  Patient, No Pcp Per  Cardiologist:  Lorine Bears, MD   Chief Complaint  Patient presents with  . Other    Follow up to discuss worsening aortic stenosis. Patient c.o SOB and swelling at times. Meds reviewed verbally with patient.       History of Present Illness: Monica [name] is a 86 y.o. female who presents for a follow-up visit regarding coronary artery disease and aortic stenosis.  The patient has prolonged history of tobacco use and presented in October 2018 with acute inferior ST elevation myocardial infarction.  This was complicated by hypotension and bradycardia.  She underwent successful angioplasty and drug-eluting stent placement to the proximal right coronary artery.  She was noted to have moderate aortic stenosis.  Echo showed an EF of 50-60%.  Aortic valve mean gradient was 21 mmHg with a valve area of 0.99. She was rehospitalized in 2018 with upper GI bleed with a hemoglobin of 9.9 which decreased to 7.9.  She underwent blood transfusion.  Brilinta was switched to Plavix.   Quit smoking after her myocardial infarction. She underwent repeat echocardiogram last month which showed an EF of 60 to 65% with progression of aortic stenosis to the severe range.  Mean gradient was 34 mmHg with a valve area of 0.88.  She reports worsening exertional dyspnea but no chest pain.  No orthopnea or PND.  No significant leg edema.  No dizziness or syncope.    Past Medical History:  Diagnosis Date  . Aortic stenosis    a. LHC 06/02/17: At least moderate aortic stenosis with a peak to peak gradient of 22 mmHg; b. TTE 05/2017: EF 55-60%, mild HK basal-midinferior wall, Gr1DD, mod to sev AS w/ mean gradient 21 mmHg, valve area 0.99, mild MR, mildly dilated LA   . CAD (coronary artery disease) 2018   a. inferior STEMI 06/02/2017: LHC 06/02/17: LM 20, D1 20%, o-pLCx 50, p-mRCA 100%  s/p PCI/DES, mRCA 50, dRCA 30  . GI bleed    a. noted 06/05/2017  . HLD (hyperlipidemia)   . Obesity     Past Surgical History:  Procedure Laterality Date  . ANKLE RECONSTRUCTION  1956   also ORIF of right arm  . CORONARY STENT INTERVENTION N/A 06/01/2017   Procedure: Coronary/Graft Acute MI Revascularization;  Surgeon: Iran Ouch, MD;  Location: [facility] INVASIVE CV LAB;  Service: Cardiovascular;  Laterality: N/A;  . LEFT HEART CATH AND CORONARY ANGIOGRAPHY N/A 06/01/2017   Procedure: LEFT HEART CATH AND CORONARY ANGIOGRAPHY;  Surgeon: Iran Ouch, MD;  Location: [facility] INVASIVE CV LAB;  Service: Cardiovascular;  Laterality: N/A;     Current Outpatient Medications  Medication Sig Dispense Refill  . aspirin (ASPIRIN 81) 81 MG EC tablet Take 81 mg by mouth daily. Swallow whole.    . carvedilol (COREG) 3.125 MG tablet Take 1 tablet (3.125 mg total) by mouth 2 (two) times daily with a meal. 180 tablet 1  . nitroGLYCERIN (NITROSTAT) 0.4 MG SL tablet Place 1 tablet (0.4 mg total) under the tongue every 5 (five) minutes as needed for chest pain. 30 tablet 2  . rosuvastatin (CRESTOR) 10 MG tablet Take 1 tablet (10 mg total) by mouth daily. 90 tablet 1   No current facility-administered medications for this visit.    Allergies:   Patient has no known allergies.    Social History:  The  patient  reports that she quit smoking about 2 years ago. Her smoking use included cigarettes. She has a 25.00 pack-year smoking history. She has never used smokeless tobacco. She reports that she does not drink alcohol or use drugs.   Family History:  The patient's family history includes Diabetes Mellitus II in her daughter; Valvular heart disease in her mother.    ROS:  Please see the history of present illness.   Otherwise, review of systems are positive for none.   All other systems are reviewed and negative.    PHYSICAL EXAM: VS:  BP 136/86 (BP Location: Right Arm, Patient Position: Sitting,  Cuff Size: Large)   Pulse 81   Ht 5\' 5"  (1.651 m)   Wt 240 lb (108.9 kg)   BMI 39.94 kg/m  , BMI Body mass index is 39.94 kg/m. GEN: Well nourished, well developed, in no acute distress  HEENT: normal  Neck: no JVD, carotid bruits, or masses Cardiac: RRR; no  rubs, or gallops.  Mild bilateral leg edema.  3 / 6 crescendo decrescendo systolic murmur in the aortic area which is late peaking .S2 is almost absent. Respiratory:  clear to auscultation bilaterally, normal work of breathing GI: soft, nontender, nondistended, + BS MS: no deformity or atrophy  Skin: warm and dry, no rash Neuro:  Strength and sensation are intact Psych: euthymic mood, full affect   EKG:  EKG is ordered today. The ekg ordered today demonstrates normal sinus rhythm with no significant ST or T wave changes.   Recent Labs: No results found for requested labs within last 8760 hours.    Lipid Panel    Component Value Date/Time   CHOL 143 12/23/2017 0927   TRIG 95 12/23/2017 0927   HDL 58 12/23/2017 0927   CHOLHDL 2.5 12/23/2017 0927   VLDL 19 12/23/2017 0927   LDLCALC 66 12/23/2017 0927      Wt Readings from Last 3 Encounters:  11/16/19 240 lb (108.9 kg)  05/27/19 240 lb (108.9 kg)  10/23/18 251 lb (113.9 kg)      No flowsheet data found.    ASSESSMENT AND PLAN:  1.  Coronary artery disease involving native coronary arteries without angina: She is doing reasonably well overall.  Suspect exertional dyspnea is due to aortic stenosis.    2.  Severe aortic stenosis: Aortic stenosis progressed gradually over the last few years and currently is in the severe range although her mean gradient is still less than 40.  I do think she has severe aortic stenosis based on her physical exam.  She does describe gradual worsening of exertional dyspnea which is likely due to this.  I discussed with her different management options and recommended proceeding with evaluation for TAVR.  The patient wants to think  about this before deciding.  We will bring her back in 3 months.    3.  Hyperlipidemia: She did not tolerate high-dose atorvastatin due to GI symptoms.   She is tolerating rosuvastatin.  I instructed her to go get her refills from her pharmacy as she ran out 2 weeks ago.  Most recent LDL was 66.  4.  Essential hypertension: Blood pressure is reasonably controlled.  The patient has not received COVID-19 vaccine and she seems to have a lot of concerns about it.  I discussed this in length with her and recommended that she proceeds with obtaining this.   Disposition:   FU with me in 3 months   Signed, Kathlyn Sacramento, MD 11/16/19  [place], [place]

## 2020-02-17 ENCOUNTER — Ambulatory Visit: Payer: Medicare Other | Admitting: Cardiovascular Disease

## 2020-05-16 ENCOUNTER — Other Ambulatory Visit: Payer: Self-pay

## 2020-05-16 ENCOUNTER — Ambulatory Visit: Payer: Medicare Other | Admitting: Cardiovascular Disease

## 2020-05-16 ENCOUNTER — Encounter: Payer: Self-pay | Admitting: Cardiovascular Disease

## 2020-05-16 VITALS — BP 138/80 | HR 79 | Ht 65.0 in | Wt 256.0 lb

## 2020-05-16 DIAGNOSIS — I350 Nonrheumatic aortic (valve) stenosis: Secondary | ICD-10-CM | POA: Diagnosis not present

## 2020-05-16 DIAGNOSIS — I2510 Atherosclerotic heart disease of native coronary artery without angina pectoris: Secondary | ICD-10-CM

## 2020-05-16 DIAGNOSIS — E785 Hyperlipidemia, unspecified: Secondary | ICD-10-CM

## 2020-05-16 DIAGNOSIS — I10 Essential (primary) hypertension: Secondary | ICD-10-CM

## 2020-05-16 NOTE — Progress Notes (Signed)
Cardiology Office Note   Date:  05/16/2020   ID:  Monica [name], DOB [birth year], MRN [id number]  PCP:  Patient, No Pcp Per  Cardiologist:  Lorine Bears, MD   Chief Complaint  Patient presents with  . OTHER    3 month fu c/o sob worse when walking or light activity. Meds reviewed verbally with pt.      History of Present Illness: Monica [name] is a 86 y.o. female who presents for a follow-up visit regarding coronary artery disease and aortic stenosis.  The patient has prolonged history of tobacco use and presented in October 2018 with acute inferior ST elevation myocardial infarction.  This was complicated by hypotension and bradycardia.  She underwent successful angioplasty and drug-eluting stent placement to the proximal right coronary artery.  She was noted to have moderate aortic stenosis.  Echo showed an EF of 50-60%.  Aortic valve mean gradient was 21 mmHg with a valve area of 0.99. She was rehospitalized in 2018 with upper GI bleed with a hemoglobin of 9.9 which decreased to 7.9.  She underwent blood transfusion.  Brilinta was switched to Plavix.   Quit smoking after her myocardial infarction. Most recent echocardiogram in March 2021 showed an EF of 60 to 65% with progression of aortic stenosis to the severe range.  Mean gradient was 34 mmHg with a valve area of 0.88.  She reports stable exertional dyspnea with no dizziness or chest pain.  She walks with a cane due to previous injury to her right ankle.    Past Medical History:  Diagnosis Date  . Aortic stenosis    a. LHC 06/02/17: At least moderate aortic stenosis with a peak to peak gradient of 22 mmHg; b. TTE 05/2017: EF 55-60%, mild HK basal-midinferior wall, Gr1DD, mod to sev AS w/ mean gradient 21 mmHg, valve area 0.99, mild MR, mildly dilated LA   . CAD (coronary artery disease) 2018   a. inferior STEMI 06/02/2017: LHC 06/02/17: LM 20, D1 20%, o-pLCx 50, p-mRCA 100% s/p PCI/DES, mRCA 50, dRCA 30  . GI bleed     a. noted 06/05/2017  . HLD (hyperlipidemia)   . Obesity     Past Surgical History:  Procedure Laterality Date  . ANKLE RECONSTRUCTION  1956   also ORIF of right arm  . CORONARY STENT INTERVENTION N/A 06/01/2017   Procedure: Coronary/Graft Acute MI Revascularization;  Surgeon: Iran Ouch, MD;  Location: [facility] INVASIVE CV LAB;  Service: Cardiovascular;  Laterality: N/A;  . LEFT HEART CATH AND CORONARY ANGIOGRAPHY N/A 06/01/2017   Procedure: LEFT HEART CATH AND CORONARY ANGIOGRAPHY;  Surgeon: Iran Ouch, MD;  Location: [facility] INVASIVE CV LAB;  Service: Cardiovascular;  Laterality: N/A;     Current Outpatient Medications  Medication Sig Dispense Refill  . aspirin (ASPIRIN 81) 81 MG EC tablet Take 81 mg by mouth daily. Swallow whole.    . carvedilol (COREG) 3.125 MG tablet Take 1 tablet (3.125 mg total) by mouth 2 (two) times daily with a meal. 180 tablet 1  . nitroGLYCERIN (NITROSTAT) 0.4 MG SL tablet Place 1 tablet (0.4 mg total) under the tongue every 5 (five) minutes as needed for chest pain. 30 tablet 2  . rosuvastatin (CRESTOR) 10 MG tablet Take 1 tablet (10 mg total) by mouth daily. 90 tablet 1   No current facility-administered medications for this visit.    Allergies:   Patient has no known allergies.    Social History:  The patient  reports that  she quit smoking about 2 years ago. Her smoking use included cigarettes. She has a 25.00 pack-year smoking history. She has never used smokeless tobacco. She reports that she does not drink alcohol and does not use drugs.   Family History:  The patient's family history includes Diabetes Mellitus II in her daughter; Valvular heart disease in her mother.    ROS:  Please see the history of present illness.   Otherwise, review of systems are positive for none.   All other systems are reviewed and negative.    PHYSICAL EXAM: VS:  BP 138/80 (BP Location: Left Arm, Patient Position: Sitting, Cuff Size: Large)   Pulse 79   Ht  5\' 5"  (1.651 m)   Wt 256 lb (116.1 kg)   SpO2 96%   BMI 42.60 kg/m  , BMI Body mass index is 42.6 kg/m. GEN: Well nourished, well developed, in no acute distress  HEENT: normal  Neck: no JVD, carotid bruits, or masses Cardiac: RRR; no  rubs, or gallops.  Mild bilateral leg edema.  3 / 6 crescendo decrescendo systolic murmur in the aortic area which is late peaking .S2 is almost absent. Respiratory:  clear to auscultation bilaterally, normal work of breathing GI: soft, nontender, nondistended, + BS MS: no deformity or atrophy  Skin: warm and dry, no rash Neuro:  Strength and sensation are intact Psych: euthymic mood, full affect   EKG:  EKG is ordered today. The ekg ordered today demonstrates normal sinus rhythm with first-degree AV block   Recent Labs: No results found for requested labs within last 8760 hours.    Lipid Panel    Component Value Date/Time   CHOL 143 12/23/2017 0927   TRIG 95 12/23/2017 0927   HDL 58 12/23/2017 0927   CHOLHDL 2.5 12/23/2017 0927   VLDL 19 12/23/2017 0927   LDLCALC 66 12/23/2017 0927      Wt Readings from Last 3 Encounters:  05/16/20 256 lb (116.1 kg)  11/16/19 240 lb (108.9 kg)  05/27/19 240 lb (108.9 kg)      No flowsheet data found.    ASSESSMENT AND PLAN:  1.  Coronary artery disease involving native coronary arteries without angina: She is doing reasonably well overall.  Suspect exertional dyspnea is due to aortic stenosis.    2.  Severe aortic stenosis: She has known severe aortic stenosis and current symptoms include exertional dyspnea without chest pain or dizziness.  I am going to repeat echocardiogram in 3 months and follow-up at that time.  She wants to postpone procedures until after the holidays.  She will let me know if her symptoms worsen before then.  She will need evaluation for TAVR at that time.  3.  Hyperlipidemia: She did not tolerate high-dose atorvastatin due to GI symptoms.   She is tolerating rosuvastatin.     4.  Essential hypertension: Blood pressure is reasonably controlled.  The patient has not received COVID-19 vaccine and I again discussed the importance of this with her especially with anticipated hospitalizations.  She is still hesitant.  Disposition:   FU with me in 3 months   Signed, 05/29/19, MD 05/16/20 [facility] [place], [place]

## 2020-05-16 NOTE — Patient Instructions (Signed)
Medication Instructions:  Your physician recommends that you continue on your current medications as directed. Please refer to the Current Medication list given to you today.  *If you need a refill on your cardiac medications before your next appointment, please call your pharmacy*   Lab Work: None ordered If you have labs (blood work) drawn today and your tests are completely normal, you will receive your results only by: Marland Kitchen MyChart Message (if you have MyChart) OR . A paper copy in the mail If you have any lab test that is abnormal or we need to change your treatment, we will call you to review the results.   Testing/Procedures: Your physician has requested that you have an echocardiogram. Echocardiography is a painless test that uses sound waves to create images of your heart. It provides your doctor with information about the size and shape of your heart and how well your heart's chambers and valves are working. This procedure takes approximately one hour. There are no restrictions for this procedure. (To be scheduled in 3 months)    Follow-Up: At [facility], you and your health needs are our priority.  As part of our continuing mission to provide you with exceptional heart care, we have created designated Provider Care Teams.  These Care Teams include your primary Cardiologist (physician) and Advanced Practice Providers (APPs -  Physician Assistants and Nurse Practitioners) who all work together to provide you with the care you need, when you need it.  We recommend signing up for the patient portal called "MyChart".  Sign up information is provided on this After Visit Summary.  MyChart is used to connect with patients for Virtual Visits (Telemedicine).  Patients are able to view lab/test results, encounter notes, upcoming appointments, etc.  Non-urgent messages can be sent to your provider as well.   To learn more about what you can do with MyChart, go to [email or web address].     Your next appointment:   3 month(s)  The format for your next appointment:   In Person  Provider:   You may see Lorine Bears, MD or one of the following Advanced Practice Providers on your designated Care Team:    Nicolasa Ducking, NP  Eula Listen, PA-C  Marisue Ivan, PA-C  Cadence Fransico Michael, [place]    Other Instructions N/A

## 2020-07-24 ENCOUNTER — Other Ambulatory Visit: Payer: Self-pay | Admitting: Cardiovascular Disease

## 2020-08-15 ENCOUNTER — Other Ambulatory Visit: Payer: Medicare Other

## 2020-08-22 ENCOUNTER — Ambulatory Visit: Payer: Medicare Other | Admitting: Family

## 2020-10-02 ENCOUNTER — Inpatient Hospital Stay
Admission: EM | Admit: 2020-10-02 | Discharge: 2020-10-09 | DRG: 480 | Disposition: A | Discharge status: Skilled Nursing Facility | Payer: Medicare Other | Attending: Internal Medicine | Admitting: Internal Medicine

## 2020-10-02 ENCOUNTER — Emergency Department: Payer: Medicare Other

## 2020-10-02 ENCOUNTER — Other Ambulatory Visit: Payer: Self-pay

## 2020-10-02 DIAGNOSIS — I952 Hypotension due to drugs: Secondary | ICD-10-CM | POA: Diagnosis not present

## 2020-10-02 DIAGNOSIS — R451 Restlessness and agitation: Secondary | ICD-10-CM | POA: Diagnosis not present

## 2020-10-02 DIAGNOSIS — E8809 Other disorders of plasma-protein metabolism, not elsewhere classified: Secondary | ICD-10-CM | POA: Diagnosis present

## 2020-10-02 DIAGNOSIS — I5033 Acute on chronic diastolic (congestive) heart failure: Secondary | ICD-10-CM | POA: Diagnosis not present

## 2020-10-02 DIAGNOSIS — Z6839 Body mass index (BMI) 39.0-39.9, adult: Secondary | ICD-10-CM | POA: Diagnosis not present

## 2020-10-02 DIAGNOSIS — Y9301 Activity, walking, marching and hiking: Secondary | ICD-10-CM | POA: Diagnosis present

## 2020-10-02 DIAGNOSIS — E669 Obesity, unspecified: Secondary | ICD-10-CM | POA: Diagnosis present

## 2020-10-02 DIAGNOSIS — I110 Hypertensive heart disease with heart failure: Secondary | ICD-10-CM | POA: Diagnosis present

## 2020-10-02 DIAGNOSIS — I340 Nonrheumatic mitral (valve) insufficiency: Secondary | ICD-10-CM | POA: Diagnosis not present

## 2020-10-02 DIAGNOSIS — I272 Pulmonary hypertension, unspecified: Secondary | ICD-10-CM | POA: Diagnosis present

## 2020-10-02 DIAGNOSIS — I350 Nonrheumatic aortic (valve) stenosis: Secondary | ICD-10-CM | POA: Diagnosis present

## 2020-10-02 DIAGNOSIS — S72002A Fracture of unspecified part of neck of left femur, initial encounter for closed fracture: Secondary | ICD-10-CM | POA: Diagnosis present

## 2020-10-02 DIAGNOSIS — Z87891 Personal history of nicotine dependence: Secondary | ICD-10-CM

## 2020-10-02 DIAGNOSIS — K8020 Calculus of gallbladder without cholecystitis without obstruction: Secondary | ICD-10-CM | POA: Diagnosis present

## 2020-10-02 DIAGNOSIS — Z01810 Encounter for preprocedural cardiovascular examination: Secondary | ICD-10-CM | POA: Diagnosis not present

## 2020-10-02 DIAGNOSIS — Y92009 Unspecified place in unspecified non-institutional (private) residence as the place of occurrence of the external cause: Secondary | ICD-10-CM

## 2020-10-02 DIAGNOSIS — Z20822 Contact with and (suspected) exposure to covid-19: Secondary | ICD-10-CM | POA: Diagnosis present

## 2020-10-02 DIAGNOSIS — T3995XA Adverse effect of unspecified nonopioid analgesic, antipyretic and antirheumatic, initial encounter: Secondary | ICD-10-CM | POA: Diagnosis not present

## 2020-10-02 DIAGNOSIS — I2510 Atherosclerotic heart disease of native coronary artery without angina pectoris: Secondary | ICD-10-CM | POA: Diagnosis present

## 2020-10-02 DIAGNOSIS — W010XXA Fall on same level from slipping, tripping and stumbling without subsequent striking against object, initial encounter: Secondary | ICD-10-CM | POA: Diagnosis present

## 2020-10-02 DIAGNOSIS — E785 Hyperlipidemia, unspecified: Secondary | ICD-10-CM | POA: Diagnosis present

## 2020-10-02 DIAGNOSIS — Z7982 Long term (current) use of aspirin: Secondary | ICD-10-CM

## 2020-10-02 DIAGNOSIS — D62 Acute posthemorrhagic anemia: Secondary | ICD-10-CM | POA: Diagnosis not present

## 2020-10-02 DIAGNOSIS — I10 Essential (primary) hypertension: Secondary | ICD-10-CM | POA: Diagnosis not present

## 2020-10-02 DIAGNOSIS — Z79899 Other long term (current) drug therapy: Secondary | ICD-10-CM

## 2020-10-02 DIAGNOSIS — J439 Emphysema, unspecified: Secondary | ICD-10-CM | POA: Diagnosis present

## 2020-10-02 DIAGNOSIS — Z419 Encounter for procedure for purposes other than remedying health state, unspecified: Secondary | ICD-10-CM

## 2020-10-02 DIAGNOSIS — Z955 Presence of coronary angioplasty implant and graft: Secondary | ICD-10-CM

## 2020-10-02 DIAGNOSIS — R2681 Unsteadiness on feet: Secondary | ICD-10-CM | POA: Diagnosis present

## 2020-10-02 DIAGNOSIS — Z833 Family history of diabetes mellitus: Secondary | ICD-10-CM | POA: Diagnosis not present

## 2020-10-02 DIAGNOSIS — I252 Old myocardial infarction: Secondary | ICD-10-CM | POA: Diagnosis not present

## 2020-10-02 DIAGNOSIS — J9601 Acute respiratory failure with hypoxia: Secondary | ICD-10-CM

## 2020-10-02 DIAGNOSIS — S72142D Displaced intertrochanteric fracture of left femur, subsequent encounter for closed fracture with routine healing: Secondary | ICD-10-CM | POA: Diagnosis not present

## 2020-10-02 DIAGNOSIS — E441 Mild protein-calorie malnutrition: Secondary | ICD-10-CM | POA: Diagnosis present

## 2020-10-02 DIAGNOSIS — S72142A Displaced intertrochanteric fracture of left femur, initial encounter for closed fracture: Secondary | ICD-10-CM | POA: Diagnosis present

## 2020-10-02 DIAGNOSIS — M1812 Unilateral primary osteoarthritis of first carpometacarpal joint, left hand: Secondary | ICD-10-CM | POA: Diagnosis present

## 2020-10-02 DIAGNOSIS — Z8249 Family history of ischemic heart disease and other diseases of the circulatory system: Secondary | ICD-10-CM | POA: Diagnosis not present

## 2020-10-02 DIAGNOSIS — I25118 Atherosclerotic heart disease of native coronary artery with other forms of angina pectoris: Secondary | ICD-10-CM | POA: Diagnosis not present

## 2020-10-02 DIAGNOSIS — D509 Iron deficiency anemia, unspecified: Secondary | ICD-10-CM | POA: Diagnosis present

## 2020-10-02 DIAGNOSIS — I342 Nonrheumatic mitral (valve) stenosis: Secondary | ICD-10-CM | POA: Diagnosis not present

## 2020-10-02 LAB — CBC WITH DIFFERENTIAL/PLATELET
Abs Immature Granulocytes: 0.04 10*3/uL (ref 0.00–0.07)
Basophils Absolute: 0 10*3/uL (ref 0.0–0.1)
Basophils Relative: 0 %
Eosinophils Absolute: 0.2 10*3/uL (ref 0.0–0.5)
Eosinophils Relative: 2 %
HCT: 43.9 % (ref 36.0–46.0)
Hemoglobin: 13.9 g/dL (ref 12.0–15.0)
Immature Granulocytes: 0 %
Lymphocytes Relative: 14 %
Lymphs Abs: 1.5 10*3/uL (ref 0.7–4.0)
MCH: 27.6 pg (ref 26.0–34.0)
MCHC: 31.7 g/dL (ref 30.0–36.0)
MCV: 87.3 fL (ref 80.0–100.0)
Monocytes Absolute: 1 10*3/uL (ref 0.1–1.0)
Monocytes Relative: 9 %
Neutro Abs: 8.1 10*3/uL — ABNORMAL HIGH (ref 1.7–7.7)
Neutrophils Relative %: 75 %
Platelets: 196 10*3/uL (ref 150–400)
RBC: 5.03 MIL/uL (ref 3.87–5.11)
RDW: 14.6 % (ref 11.5–15.5)
WBC: 10.9 10*3/uL — ABNORMAL HIGH (ref 4.0–10.5)
nRBC: 0 % (ref 0.0–0.2)

## 2020-10-02 LAB — RESP PANEL BY RT-PCR (FLU A&B, COVID) ARPGX2
Influenza A by PCR: NEGATIVE
Influenza B by PCR: NEGATIVE
SARS Coronavirus 2 by RT PCR: NEGATIVE

## 2020-10-02 LAB — BASIC METABOLIC PANEL
Anion gap: 6 (ref 5–15)
BUN: 19 mg/dL (ref 8–23)
CO2: 26 mmol/L (ref 22–32)
Calcium: 8.7 mg/dL — ABNORMAL LOW (ref 8.9–10.3)
Chloride: 104 mmol/L (ref 98–111)
Creatinine, Ser: 0.87 mg/dL (ref 0.44–1.00)
GFR, Estimated: >60 mL/min (ref >60)
Glucose, Bld: 118 mg/dL — ABNORMAL HIGH (ref 70–99)
Potassium: 4.2 mmol/L (ref 3.5–5.1)
Sodium: 136 mmol/L (ref 135–145)

## 2020-10-02 LAB — TROPONIN I (HIGH SENSITIVITY)
Troponin I (High Sensitivity): 14 ng/L (ref <18)
Troponin I (High Sensitivity): 12 ng/L (ref <18)

## 2020-10-02 LAB — PROTIME-INR
INR: 1.1 (ref 0.8–1.2)
Prothrombin Time: 13.9 seconds (ref 11.4–15.2)

## 2020-10-02 LAB — BRAIN NATRIURETIC PEPTIDE: B Natriuretic Peptide: 178.2 pg/mL — ABNORMAL HIGH (ref 0.0–100.0)

## 2020-10-02 MED ORDER — SENNA 8.6 MG PO TABS
1.0000 | ORAL_TABLET | Freq: Two times a day (BID) | ORAL | Status: DC
  Administered 2020-10-02 – 2020-10-03 (×2): 8.6 mg via ORAL
  Filled 2020-10-02 (×2): qty 1

## 2020-10-02 MED ORDER — MORPHINE SULFATE (PF) 2 MG/ML IV SOLN
0.5000 mg | INTRAVENOUS | Status: DC | PRN
  Administered 2020-10-02 (×3): 0.5 mg via INTRAVENOUS
  Filled 2020-10-02 (×3): qty 1

## 2020-10-02 MED ORDER — METHOCARBAMOL 500 MG PO TABS
500.0000 mg | ORAL_TABLET | Freq: Four times a day (QID) | ORAL | Status: DC | PRN
  Administered 2020-10-02: 500 mg via ORAL
  Filled 2020-10-02 (×2): qty 1

## 2020-10-02 MED ORDER — ONDANSETRON HCL 4 MG/2ML IJ SOLN
4.0000 mg | Freq: Once | INTRAMUSCULAR | Status: AC
  Administered 2020-10-02: 4 mg via INTRAVENOUS
  Filled 2020-10-02: qty 2

## 2020-10-02 MED ORDER — NITROGLYCERIN 0.4 MG SL SUBL: 0.4000 mg | SUBLINGUAL_TABLET | SUBLINGUAL | Status: DC | PRN

## 2020-10-02 MED ORDER — METHOCARBAMOL 1000 MG/10ML IJ SOLN
500.0000 mg | Freq: Four times a day (QID) | INTRAVENOUS | Status: DC | PRN
  Filled 2020-10-02: qty 5

## 2020-10-02 MED ORDER — MORPHINE SULFATE (PF) 4 MG/ML IV SOLN
4.0000 mg | Freq: Once | INTRAVENOUS | Status: AC
  Administered 2020-10-02: 4 mg via INTRAVENOUS
  Filled 2020-10-02: qty 1

## 2020-10-02 MED ORDER — SUCCINYLCHOLINE CHLORIDE 200 MG/10ML IV SOSY
PREFILLED_SYRINGE | INTRAVENOUS | Status: AC
  Filled 2020-10-02: qty 10

## 2020-10-02 MED ORDER — LIDOCAINE HCL (PF) 2 % IJ SOLN
INTRAMUSCULAR | Status: AC
  Filled 2020-10-02: qty 5

## 2020-10-02 MED ORDER — FENTANYL CITRATE (PF) 100 MCG/2ML IJ SOLN
INTRAMUSCULAR | Status: AC
  Filled 2020-10-02: qty 2

## 2020-10-02 MED ORDER — TRANEXAMIC ACID-NACL 1000-0.7 MG/100ML-% IV SOLN: 1000.0000 mg | INTRAVENOUS | Status: DC

## 2020-10-02 MED ORDER — ROSUVASTATIN CALCIUM 10 MG PO TABS
10.0000 mg | ORAL_TABLET | Freq: Every day | ORAL | Status: DC
Start: 2020-10-02 — End: 2020-10-09
  Administered 2020-10-03 – 2020-10-09 (×7): 10 mg via ORAL
  Filled 2020-10-02 (×8): qty 1

## 2020-10-02 MED ORDER — ROCURONIUM BROMIDE 10 MG/ML (PF) SYRINGE
PREFILLED_SYRINGE | INTRAVENOUS | Status: AC
  Filled 2020-10-02: qty 10

## 2020-10-02 MED ORDER — PROPOFOL 10 MG/ML IV BOLUS
INTRAVENOUS | Status: AC
  Filled 2020-10-02: qty 20

## 2020-10-02 MED ORDER — CARVEDILOL 3.125 MG PO TABS
3.1250 mg | ORAL_TABLET | Freq: Two times a day (BID) | ORAL | Status: DC
  Administered 2020-10-02 – 2020-10-04 (×3): 3.125 mg via ORAL
  Filled 2020-10-02 (×3): qty 1

## 2020-10-02 MED ORDER — HYDROCODONE-ACETAMINOPHEN 5-325 MG PO TABS
1.0000 | ORAL_TABLET | Freq: Four times a day (QID) | ORAL | Status: DC | PRN
  Administered 2020-10-02 – 2020-10-03 (×3): 2 via ORAL
  Filled 2020-10-02 (×3): qty 2

## 2020-10-02 NOTE — Progress Notes (Addendum)
Full consult note and discussion with patient to follow later today.   Called by ED staff. Imaging reviewed.   - Plan for OR later this afternoon ~230pm, pending OR availability. - Keep NPO  - Hold anticoagulation - Admit to Hospitalist team. Appreciate preop evaluation.

## 2020-10-02 NOTE — ED Provider Notes (Signed)
[facility] Emergency Department Provider Note ____________________________________________   Event Date/Time   First MD Initiated Contact with Patient 10/02/20 [id number]     (approximate)  I have reviewed the triage vital signs and the nursing notes.   HISTORY  Chief Complaint Fall    HPI Monica [name] is a 87 y.o. female with PMH as noted below who presents with left hip injury after mechanical fall about an hour ago.  The patient also reports hitting her left hand but denies any other injuries.  She did not hit her head and had no LOC.  She was unable to get up or walk afterwards.  EMS noted that her O2 saturation was 90% on room air but the patient denies any acute shortness of breath.  Past Medical History:  Diagnosis Date  . Aortic stenosis    a. LHC 06/02/17: At least moderate aortic stenosis with a peak to peak gradient of 22 mmHg; b. TTE 05/2017: EF 55-60%, mild HK basal-midinferior wall, Gr1DD, mod to sev AS w/ mean gradient 21 mmHg, valve area 0.99, mild MR, mildly dilated LA   . CAD (coronary artery disease) 2018   a. inferior STEMI 06/02/2017: LHC 06/02/17: LM 20, D1 20%, o-pLCx 50, p-mRCA 100% s/p PCI/DES, mRCA 50, dRCA 30  . GI bleed    a. noted 06/05/2017  . HLD (hyperlipidemia)   . Obesity     Patient Active Problem List   Diagnosis Date Noted  . Cellulitis of left lower extremity 04/29/2018  . GI bleed 06/05/2017  . Nausea vomiting and diarrhea   . Acute ST elevation myocardial infarction (STEMI) of inferior wall (HCC) 06/02/2017    Past Surgical History:  Procedure Laterality Date  . ANKLE RECONSTRUCTION  1956   also ORIF of right arm  . CORONARY STENT INTERVENTION N/A 06/01/2017   Procedure: Coronary/Graft Acute MI Revascularization;  Surgeon: Iran Ouch, MD;  Location: [facility] INVASIVE CV LAB;  Service: Cardiovascular;  Laterality: N/A;  . LEFT HEART CATH AND CORONARY ANGIOGRAPHY N/A 06/01/2017   Procedure: LEFT HEART CATH  AND CORONARY ANGIOGRAPHY;  Surgeon: Iran Ouch, MD;  Location: [facility] INVASIVE CV LAB;  Service: Cardiovascular;  Laterality: N/A;    Prior to Admission medications   Medication Sig Start Date End Date Taking? Authorizing Provider  aspirin (ASPIRIN 81) 81 MG EC tablet Take 81 mg by mouth daily. Swallow whole.    [provider]  carvedilol (COREG) 3.125 MG tablet TAKE 1 TABLET BY MOUTH TWICE DAILY WITH A MEAL 07/24/20   Iran Ouch, MD  nitroGLYCERIN (NITROSTAT) 0.4 MG SL tablet Place 1 tablet (0.4 mg total) under the tongue every 5 (five) minutes as needed for chest pain. 06/04/17   Shaune Pollack, MD  rosuvastatin (CRESTOR) 10 MG tablet Take 1 tablet by mouth once daily 07/24/20   Iran Ouch, MD    Allergies Patient has no known allergies.  Family History  Problem Relation Age of Onset  . Valvular heart disease Mother   . Diabetes Mellitus II Daughter     Social History Social History   Tobacco Use  . Smoking status: Former Smoker    Packs/day: 0.50    Years: 50.00    Pack years: 25.00    Types: Cigarettes    Quit date: 06/01/2017    Years since quitting: 3.3  . Smokeless tobacco: Never Used  Vaping Use  . Vaping Use: Never used  Substance Use Topics  . Alcohol use: No  .  Drug use: No    Review of Systems  Constitutional: No fever. Eyes: No redness. ENT: No sore throat. Cardiovascular: Denies chest pain. Respiratory: Denies shortness of breath. Gastrointestinal: No vomiting or diarrhea.  Genitourinary: Negative for flank pain. Musculoskeletal: Negative for back pain.  Positive for left hip pain. Skin: Negative for rash. Neurological: Negative for headache.   ____________________________________________   PHYSICAL EXAM:  VITAL SIGNS: ED Triage Vitals  Enc Vitals Group     BP 10/02/20 0858 (!) 171/111     Pulse Rate 10/02/20 0858 89     Resp 10/02/20 0858 18     Temp 10/02/20 0858 97.9 F (36.6 C)     Temp Source 10/02/20 0858  Oral     SpO2 10/02/20 0858 100 %     Weight 10/02/20 0859 240 lb (108.9 kg)     Height 10/02/20 0859 5\' 5"  (1.651 m)     Head Circumference --      Peak Flow --      Pain Score 10/02/20 0859 5     Pain Loc --      Pain Edu? --      Excl. in GC? --     Constitutional: Alert and oriented.  Uncomfortable appearing but in no acute distress. Eyes: Conjunctivae are normal.  EOMI. Head: Atraumatic. Nose: No congestion/rhinnorhea. Mouth/Throat: Mucous membranes are moist.   Neck: Normal range of motion.  Cardiovascular: Normal rate, regular rhythm. Grossly normal heart sounds.  Good peripheral circulation. Respiratory: Normal respiratory effort.  No retractions. Lungs CTAB. Gastrointestinal: No distention.  Musculoskeletal: No lower extremity edema.  Extremities warm and well perfused.  Pain on any range of motion of left hip.  Tender laterally.  Left leg slightly shortened.  Mild tenderness and swelling to the dorsum of the left hand. Neurologic:  Normal speech and language.  Motor intact in all extremities.  Normal coordination.  No gross focal neurologic deficits are appreciated.  Skin:  Skin is warm and dry. No rash noted. Psychiatric: Mood and affect are normal. Speech and behavior are normal.  ____________________________________________   LABS (all labs ordered are listed, but only abnormal results are displayed)  Labs Reviewed  BASIC METABOLIC PANEL - Abnormal; Notable for the following components:      Result Value   Glucose, Bld 118 (*)    Calcium 8.7 (*)    All other components within normal limits  CBC WITH DIFFERENTIAL/PLATELET - Abnormal; Notable for the following components:   WBC 10.9 (*)    Neutro Abs 8.1 (*)    All other components within normal limits  BRAIN NATRIURETIC PEPTIDE - Abnormal; Notable for the following components:   B Natriuretic Peptide 178.2 (*)    All other components within normal limits  RESP PANEL BY RT-PCR (FLU A&B, COVID) [id number]   PROTIME-INR  TROPONIN I (HIGH SENSITIVITY)  TROPONIN I (HIGH SENSITIVITY)   ____________________________________________  EKG  ED ECG REPORT I, 10/04/20, the attending physician, personally viewed and interpreted this ECG.  Date: 10/02/2020 EKG Time: 0903 Rate: 87 Rhythm: normal sinus rhythm QRS Axis: Borderline left axis Intervals: normal ST/T Wave abnormalities: normal Narrative Interpretation: no evidence of acute ischemia  ____________________________________________  RADIOLOGY  Chest x-ray interpreted by me shows XR L hip: Comminuted intertrochanteric hip fracture XR L hand: No acute fracture  ____________________________________________   PROCEDURES  Procedure(s) performed: No  Procedures  Critical Care performed: No ____________________________________________   INITIAL IMPRESSION / ASSESSMENT AND PLAN / ED COURSE  Pertinent labs &  imaging results that were available during my care of the patient were reviewed by me and considered in my medical decision making (see chart for details).  87 year old female with PMH as noted above presents with left hip pain after mechanical fall from standing height.  She also has some pain to the left hand.  She was unable to get up or bear weight on the left leg.  She did not hit her head and had no LOC.  On exam, the patient is uncomfortable appearing but in no acute distress.  Her vital signs are normal except for hypertension.  She has pain on any range of motion of the left hip and slight shortening of the left leg but no deformity.  There is mild swelling to the left hand.  All extremities are neuro/vascular intact.  Neurologic exam is nonfocal.  Presentation is concerning for left hip or pelvic fracture.  We will obtain x-rays of the left hip and left hand.  The patient is borderline hypoxic on room air of unclear etiology.  She has a history of CAD and aortic stenosis.  Will obtain a chest x-ray, Covid swab,  and lab work-up as well.  ----------------------------------------- 11:15 AM on 10/02/2020 -----------------------------------------  X-ray shows a comminuted left intertrochanteric hip fracture.  I consulted Dr. Allena Katz from orthopedics who will evaluate the patient.  I then consulted Dr. Joylene Igo from the hospitalist service for admission.  __________________________  Reatha Armour was evaluated in Emergency Department on 10/02/2020 for the symptoms described in the history of present illness. She was evaluated in the context of the global COVID-19 pandemic, which necessitated consideration that the patient might be at risk for infection with the SARS-CoV-2 virus that causes COVID-19. Institutional protocols and algorithms that pertain to the evaluation of patients at risk for COVID-19 are in a state of rapid change based on information released by regulatory bodies including the CDC and federal and state organizations. These policies and algorithms were followed during the patient's care in the ED.  ____________________________________________   FINAL CLINICAL IMPRESSION(S) / ED DIAGNOSES  Final diagnoses:  Closed fracture of left hip, initial encounter (HCC)      NEW MEDICATIONS STARTED DURING THIS VISIT:  New Prescriptions   No medications on file     Note:  This document was prepared using Dragon voice recognition software and may include unintentional dictation errors.    Dionne Bucy, MD 10/02/20 1115

## 2020-10-02 NOTE — Consult Note (Signed)
Cardiology Consultation:   Patient ID: Monica [name]; [id number]; 02-Feb-1934   Admit date: 10/02/2020 Date of Consult: 10/02/2020  Primary Care Provider: Patient, No Pcp Per Primary Cardiologist: Kirke Corin Primary Electrophysiologist:  None   Patient Profile:   Monica [name] is a 87 y.o. female with a hx of CAD with inferior STEMI in 05/2017 complicated by hypotension and bradycardia status post PCI/DES to the proximal RCA, severe aortic stenosis, GI bleed in 2018, prolonged tobacco use quitting after her MI, HLD, and obesity who is being seen today for for preoperative cardiac risk stratification at the request of Dr. Joylene Igo.  History of Present Illness:   [name] was admitted to the hospital in 05/2017 with an inferior STEMI complicated by hypotension and bradycardia.  LHC showed severe one-vessel CAD with thrombotic occlusion of the proximal and mid RCA as well as moderate ostial LCx and mild LAD stenoses.  She underwent successful PCI/DES to the proximal RCA.  There was at least moderate aortic stenosis noted at that time.  Echo in 05/2017 showed an EF of 55 to 60%, mild hypokinesis of the basal mid inferior myocardium, grade 1 diastolic dysfunction, moderate to severe aortic stenosis with a mean gradient of 21 mmHg and a valve area of 0.99 cm.  She was readmitted in 2018 with an upper GI bleed which required packed red blood cell transfusion.  In this setting Brilinta was transitioned to Plavix.  Most recent echo from 10/2019 showed a preserved LV systolic function with progression of aortic disease to the severe range with a mean gradient of 34 mmHg and a valve area of 0.88 cm.  She was last seen in the office in 05/2020 and noted stable exertional dyspnea without dizziness or chest pain.  She preferred to defer any further cardiac testing regarding her aortic stenosis until after the holidays.  At baseline, she is somewhat unsteady on her feet and typically ambulates with a cane.  This morning, around 6:40 AM, she got up to turn her heat up. While walking around her bed, she lost her balance and fell onto her left hip. There was no chest pain, dizziness, palpitations, dyspnea, presyncope, or syncope associated with this. Prior to this fall, she indicates she had done well from cardiac perspective.   She was admitted to the hospital on 10/02/2020 with a mechanical fall.  Upon arrival she was noted to be hypertensive with BP in the 170s over low 100s.  Labs showed an initial high-sensitivity troponin of negative, BNP 178, WBC at 10.9, Hgb 13.9, glucose 118, normal renal function, and potassium 4.2.  Covid negative.  Chest x-ray showed possible diffuse scarring with potential superimposed atypical inflammation versus edema.  She was noted to have a severely comminuted and displaced intertrochanteric fracture of the left proximal femur.  She denies any anginal or syncopal symptoms. Maintaining sinus rhythm on telemetry.     Past Medical History:  Diagnosis Date  . Aortic stenosis    a. LHC 06/02/17: At least moderate aortic stenosis with a peak to peak gradient of 22 mmHg; b. TTE 05/2017: EF 55-60%, mild HK basal-midinferior wall, Gr1DD, mod to sev AS w/ mean gradient 21 mmHg, valve area 0.99, mild MR, mildly dilated LA   . CAD (coronary artery disease) 2018   a. inferior STEMI 06/02/2017: LHC 06/02/17: LM 20, D1 20%, o-pLCx 50, p-mRCA 100% s/p PCI/DES, mRCA 50, dRCA 30  . GI bleed    a. noted 06/05/2017  . HLD (hyperlipidemia)   .  Obesity     Past Surgical History:  Procedure Laterality Date  . ANKLE RECONSTRUCTION  1956   also ORIF of right arm  . CORONARY STENT INTERVENTION N/A 06/01/2017   Procedure: Coronary/Graft Acute MI Revascularization;  Surgeon: Iran Ouch, MD;  Location: [facility] INVASIVE CV LAB;  Service: Cardiovascular;  Laterality: N/A;  . LEFT HEART CATH AND CORONARY ANGIOGRAPHY N/A 06/01/2017   Procedure: LEFT HEART CATH AND CORONARY ANGIOGRAPHY;   Surgeon: Iran Ouch, MD;  Location: [facility] INVASIVE CV LAB;  Service: Cardiovascular;  Laterality: N/A;     Home Meds: Prior to Admission medications   Medication Sig Start Date End Date Taking? Authorizing Provider  aspirin (ASPIRIN 81) 81 MG EC tablet Take 81 mg by mouth daily. Swallow whole.    [provider]  carvedilol (COREG) 3.125 MG tablet TAKE 1 TABLET BY MOUTH TWICE DAILY WITH A MEAL 07/24/20   Iran Ouch, MD  nitroGLYCERIN (NITROSTAT) 0.4 MG SL tablet Place 1 tablet (0.4 mg total) under the tongue every 5 (five) minutes as needed for chest pain. 06/04/17   Shaune Pollack, MD  rosuvastatin (CRESTOR) 10 MG tablet Take 1 tablet by mouth once daily 07/24/20   Iran Ouch, MD    Inpatient Medications: Scheduled Meds: . carvedilol  3.125 mg Oral BID WC  . rosuvastatin  10 mg Oral Daily  . senna  1 tablet Oral BID   Continuous Infusions: . methocarbamol (ROBAXIN) IV    . tranexamic acid     PRN Meds: HYDROcodone-acetaminophen, methocarbamol **OR** methocarbamol (ROBAXIN) IV, morphine injection, nitroGLYCERIN  Allergies:  No Known Allergies  Social History:   Social History   Socioeconomic History  . Marital status: Married    Spouse name: Not on file  . Number of children: Not on file  . Years of education: Not on file  . Highest education level: Not on file  Occupational History  . Occupation: retired  Tobacco Use  . Smoking status: Former Smoker    Packs/day: 0.50    Years: 50.00    Pack years: 25.00    Types: Cigarettes    Quit date: 06/01/2017    Years since quitting: 3.3  . Smokeless tobacco: Never Used  Vaping Use  . Vaping Use: Never used  Substance and Sexual Activity  . Alcohol use: No  . Drug use: No  . Sexual activity: Never    Partners: Female  Other Topics Concern  . Not on file  Social History Narrative  . Not on file   Social Determinants of Health   Financial Resource Strain: Not on file  Food Insecurity: Not on  file  Transportation Needs: Not on file  Physical Activity: Not on file  Stress: Not on file  Social Connections: Not on file  Intimate Partner Violence: Not on file     Family History:   Family History  Problem Relation Age of Onset  . Valvular heart disease Mother   . Diabetes Mellitus II Daughter     ROS:  Review of Systems  Constitutional: Positive for malaise/fatigue. Negative for chills, diaphoresis, fever and weight loss.  HENT: Negative for congestion.   Eyes: Negative for discharge and redness.  Respiratory: Negative for cough, sputum production, shortness of breath and wheezing.   Cardiovascular: Negative for chest pain, palpitations, orthopnea, claudication, leg swelling and PND.  Gastrointestinal: Negative for abdominal pain, heartburn, nausea and vomiting.  Musculoskeletal: Positive for falls and joint pain. Negative for myalgias.  Skin: Negative for  rash.  Neurological: Positive for weakness. Negative for dizziness, tingling, tremors, sensory change, speech change, focal weakness and loss of consciousness.  Endo/Heme/Allergies: Does not bruise/bleed easily.  Psychiatric/Behavioral: Negative for substance abuse. The patient is not nervous/anxious.   All other systems reviewed and are negative.     Physical Exam/Data:   Vitals:   10/02/20 1100 10/02/20 1130 10/02/20 1200 10/02/20 1230  BP: 136/73 140/80 128/78 126/74  Pulse: 80 83 87 89  Resp: 15 14 16 15   Temp:      TempSrc:      SpO2: 100% 100% 95% 95%  Weight:      Height:       No intake or output data in the 24 hours ending 10/02/20 1249 Filed Weights   10/02/20 0859  Weight: 108.9 kg   Body mass index is 39.94 kg/m.   Physical Exam: General: Well developed, well nourished, in no acute distress. Head: Normocephalic, atraumatic, sclera non-icteric, no xanthomas, nares without discharge.  Neck: Negative for carotid bruits. JVD not elevated. Lungs: Clear bilaterally to auscultation without  wheezes, rales, or rhonchi. Breathing is unlabored. Heart: RRR with S1 S2. III/VI harsh systolic murmur in the RUSB, no rubs, or gallops appreciated. Abdomen: Soft, non-tender, non-distended with normoactive bowel sounds. No hepatomegaly. No rebound/guarding. No obvious abdominal masses. Msk:  Strength and tone appear normal for age. Extremities: No clubbing or cyanosis. No edema. Distal pedal pulses are 2+ and equal bilaterally. Neuro: Alert and oriented X 3. No facial asymmetry. No focal deficit. Moves all extremities spontaneously. Psych:  Responds to questions appropriately with a normal affect.   EKG:  The EKG was personally reviewed and demonstrates: Not in Epic for review Telemetry:  Telemetry was personally reviewed and demonstrates: SR  Weights: [employer]iled Weights   10/02/20 0859  Weight: 108.9 kg    Relevant CV Studies:  2D echo this admission pending __________  2D echo 10/2019: 1. Left ventricular ejection fraction, by estimation, is 60 to 65%. The  left ventricle has normal function. The left ventricle has no regional  wall motion abnormalities. There is moderate left ventricular hypertrophy.  Indeterminate diastolic filling due  to E-A fusion.  2. Right ventricular systolic function is normal. The right ventricular  size is normal. Tricuspid regurgitation signal is inadequate for assessing  PA pressure.  3. Left atrial size was mildly dilated.  4. The mitral valve is degenerative. No evidence of mitral valve  regurgitation. No evidence of mitral stenosis.  5. The aortic valve has an indeterminant number of cusps. Aortic valve  regurgitation is trivial. Moderate to severe aortic valve stenosis. Aortic  valve area, by VTI measures 0.88 cm. Aortic valve mean gradient measures  34.0 mmHg. Aortic valve Vmax  measures 3.92 m/s.  6. The inferior vena cava is normal in size with greater than 50%  respiratory variability, suggesting right atrial pressure of 3  mmHg. __________  2D echo 04/2018: - Left ventricle: The cavity size was normal. There was mild  concentric hypertrophy. Systolic function was normal. The  estimated ejection fraction was in the range of 60% to 65%. Wall  motion was normal; there were no regional wall motion  abnormalities. Doppler parameters are consistent with abnormal  left ventricular relaxation (grade 1 diastolic dysfunction).  - Aortic valve: There was moderate to severe stenosis. Mean  gradient (S): 22 mm Hg. Valve area (VTI): 0.99 cm^2.  - Left atrium: The atrium was moderately dilated.  - Pulmonary arteries: Systolic pressure was mildly increased. PA  peak pressure: 35 mm Hg (S).  - Pericardium, extracardiac: A trivial pericardial effusion was  identified posterior to the heart. __________  2D echo 12/2017: - Left ventricle: The cavity size was normal. There was mild  concentric hypertrophy. Systolic function was normal. The  estimated ejection fraction was in the range of 60% to 65%. Wall  motion was normal; there were no regional wall motion  abnormalities. Doppler parameters are consistent with abnormal  left ventricular relaxation (grade 1 diastolic dysfunction).  - Aortic valve: Transvalvular velocity was increased. There was  moderate to severe stenosis by estimated AVA, moderate stenosis  by mean gradient and peak velocity. . Peak velocity (S): 376  cm/s. Mean gradient (S): 31 mm Hg. Valve area (VTI): 0.97 cm^2.  - Mitral valve: Calcified annulus.  - Left atrium: The atrium was mildly dilated.  - Right ventricle: Systolic function was normal.  - Pulmonary arteries: Systolic pressure was mildly elevated. PA  peak pressure: 43 mm Hg (S). __________  2D echo 05/2017: - Left ventricle: The cavity size was normal. There was mild  concentric hypertrophy. Systolic function was normal. The  estimated ejection fraction was in the range of 55% to 60%. Mild   hypokinesis of the basal-midinferior myocardium. Doppler  parameters are consistent with abnormal left ventricular  relaxation (grade 1 diastolic dysfunction).  - Aortic valve: There was moderate to severe stenosis. Mean  gradient (S): 21 mm Hg. Valve area (VTI): 0.99 cm^2.  - Mitral valve: Calcified annulus. There was mild regurgitation.  - Left atrium: The atrium was mildly dilated. __________  LHC 05/2017:   Mid RCA lesion, 50 %stenosed.  Dist RCA lesion, 30 %stenosed.  LM lesion, 20 %stenosed.  Ost 1st Diag to 1st Diag lesion, 20 %stenosed.  Ost Cx to Prox Cx lesion, 50 %stenosed.  A drug eluting stent was successfully placed.  Prox RCA to Mid RCA lesion, 100 %stenosed.  Post intervention, there is a 0% residual stenosis.   1. Severe one-vessel coronary artery disease with thrombotic occlusion of the proximal and mid right coronary artery. Moderate ostial left circumflex stenosis and mild LAD disease. 2. At least moderate aortic stenosis with a peak to peak gradient of 22 mmHg. Normal left ventricular end-diastolic pressure. 3. Successful angioplasty and drug-eluting stent placement to the proximal right coronary artery extending into the midsegment. 4. Severe hypotension and bradycardia after opening the right coronary artery  likely due to neurohormonal reflects and required treatment with atropine and norepinephrine drip which was discontinued at the end of the case.  Recommendations: Dual antiplatelet therapy for at least one year. Aggressive treatment of risk factors and smoking cessation. Obtain an echocardiogram to evaluate aortic stenosis and LV systolic function. Avoid catheterization via the right radial artery in the future due to right radial loop.   Laboratory Data:  Chemistry Recent Labs  Lab 10/02/20 0907  NA 136  K 4.2  CL 104  CO2 26  GLUCOSE 118*  BUN 19  CREATININE 0.87  CALCIUM 8.7*  GFRNONAA >60  ANIONGAP 6    No results for  input(s): PROT, ALBUMIN, AST, ALT, ALKPHOS, BILITOT in the last 168 hours. Hematology Recent Labs  Lab 10/02/20 0907  WBC 10.9*  RBC 5.03  HGB 13.9  HCT 43.9  MCV 87.3  MCH 27.6  MCHC 31.7  RDW 14.6  PLT 196   Cardiac EnzymesNo results for input(s): TROPONINI in the last 168 hours. No results for input(s): TROPIPOC in the last 168 hours.  BNP Recent  Labs  Lab 10/02/20 0907  BNP 178.2*    DDimer No results for input(s): DDIMER in the last 168 hours.  Radiology/Studies:  DG Hand 2 View Left  Result Date: 10/02/2020 IMPRESSION: Moderate osteoarthritis of the first carpometacarpal joint. No acute abnormality seen in the left hand. Electronically Signed   By: Lupita Raider M.D.   On: 10/02/2020 09:52   DG Chest Portable 1 View  Result Date: 10/02/2020 IMPRESSION: Increased diffuse interstitial densities are noted throughout both lungs which may represent diffuse scarring, but acute superimposed atypical inflammation or edema cannot be excluded. Electronically Signed   By: Lupita Raider M.D.   On: 10/02/2020 09:50   DG Hip Unilat W or Wo Pelvis 2-3 Views Left  Result Date: 10/02/2020 IMPRESSION: Severely comminuted and displaced intertrochanteric fracture of proximal left femur. Electronically Signed   By: Lupita Raider M.D.   On: 10/02/2020 09:49    Assessment and Plan:   1.  Preoperative cardiac risk stratification: -Patient suffered a severely comminuted and displaced intertrochanteric fracture of the left proximal femur in the setting of a mechanical fall earlier today -Per [facility] Activity Status Index, she is able to achieve > 4 METs -Formal risk stratification to be provided pending echo as she has known severe aortic stenosis, discussed with MD -Recommend continuing aspirin in the perioperative setting if possible  2.  CAD involving the native coronary arteries without angina: -Never with anginal symptoms  -High-sensitivity troponin of 14 with delta of 12 -Echo  pending  -Continue ASA, carvedilol, and Crestor  3.  Severe aortic stenosis: -Most recent echo from 10/2019 showed moderate to severe aortic stenosis with a valve area of 0.88 cm and a mean gradient of 34 mmHg which showed progression of disease when compared to prior study -Update echo as outlined above with further recommendations regarding preoperative cardiac risk stratification pending these results -Will need TAVR evaluation with timing to be determined  4.  HTN: -Initially elevated in the 170s over low 100s and currently improved -Continue current medical therapy  5.  HLD: -Continue PTA Crestor   For questions or updates, please contact [facility] Please consult [email or web address] for contact info under Cardiology/STEMI.   Signed, Eula Listen, PA-C [facility] Pager: [phone] 10/02/2020, 12:49 PM

## 2020-10-02 NOTE — Consult Note (Signed)
ORTHOPAEDIC CONSULTATION  REQUESTING PHYSICIAN: Lucile Shutters, MD  Chief Complaint:   L hip pain  History of Present Illness: Monica [name] is a 87 y.o. female with a history of prior MI with stent placement in 2018, aortic stenosis, and GI bleed who had a fall earlier today.  The patient noted immediate hip pain and inability to ambulate.  The patient ambulates with a cane or walker at baseline.  The patient lives at home with her daughter. Pain is described as sharp at its worst and a dull ache at its best.  Pain is rated a 10 out of 10 in severity with movement or weightbearing. X-rays in the emergency department show a left intertrochanteric hip fracture.    Past Medical History:  Diagnosis Date  . Aortic stenosis    a. LHC 06/02/17: At least moderate aortic stenosis with a peak to peak gradient of 22 mmHg; b. TTE 05/2017: EF 55-60%, mild HK basal-midinferior wall, Gr1DD, mod to sev AS w/ mean gradient 21 mmHg, valve area 0.99, mild MR, mildly dilated LA   . CAD (coronary artery disease) 2018   a. inferior STEMI 06/02/2017: LHC 06/02/17: LM 20, D1 20%, o-pLCx 50, p-mRCA 100% s/p PCI/DES, mRCA 50, dRCA 30  . GI bleed    a. noted 06/05/2017  . HLD (hyperlipidemia)   . Obesity    Past Surgical History:  Procedure Laterality Date  . ANKLE RECONSTRUCTION  1956   also ORIF of right arm  . CORONARY STENT INTERVENTION N/A 06/01/2017   Procedure: Coronary/Graft Acute MI Revascularization;  Surgeon: Iran Ouch, MD;  Location: [facility] INVASIVE CV LAB;  Service: Cardiovascular;  Laterality: N/A;  . LEFT HEART CATH AND CORONARY ANGIOGRAPHY N/A 06/01/2017   Procedure: LEFT HEART CATH AND CORONARY ANGIOGRAPHY;  Surgeon: Iran Ouch, MD;  Location: [facility] INVASIVE CV LAB;  Service: Cardiovascular;  Laterality: N/A;   Social History   Socioeconomic History  . Marital status: Married    Spouse name: Not on file  .  Number of children: Not on file  . Years of education: Not on file  . Highest education level: Not on file  Occupational History  . Occupation: retired  Tobacco Use  . Smoking status: Former Smoker    Packs/day: 0.50    Years: 50.00    Pack years: 25.00    Types: Cigarettes    Quit date: 06/01/2017    Years since quitting: 3.3  . Smokeless tobacco: Never Used  Vaping Use  . Vaping Use: Never used  Substance and Sexual Activity  . Alcohol use: No  . Drug use: No  . Sexual activity: Never    Partners: Female  Other Topics Concern  . Not on file  Social History Narrative  . Not on file   Social Determinants of Health   Financial Resource Strain: Not on file  Food Insecurity: Not on file  Transportation Needs: Not on file  Physical Activity: Not on file  Stress: Not on file  Social Connections: Not on file   Family History  Problem Relation Age of Onset  . Valvular heart disease Mother   . Diabetes Mellitus II Daughter    No Known Allergies Prior to Admission medications   Medication Sig Start Date End Date Taking? Authorizing Provider  aspirin 81 MG EC tablet Take 81 mg by mouth daily.   Yes [provider]  carvedilol (COREG) 3.125 MG tablet TAKE 1 TABLET BY MOUTH TWICE DAILY WITH A MEAL Patient taking differently:  Take 3.125 mg by mouth 2 (two) times daily with a meal. 07/24/20  Yes Iran Ouch, MD  nitroGLYCERIN (NITROSTAT) 0.4 MG SL tablet Place 1 tablet (0.4 mg total) under the tongue every 5 (five) minutes as needed for chest pain. 06/04/17  Yes Shaune Pollack, MD  rosuvastatin (CRESTOR) 10 MG tablet Take 1 tablet by mouth once daily Patient taking differently: Take 10 mg by mouth daily. 07/24/20  Yes Iran Ouch, MD   Recent Labs    10/02/20 [phone] 10/02/20 1153  WBC 10.9*  --   HGB 13.9  --   HCT 43.9  --   PLT 196  --   K 4.2  --   CL 104  --   CO2 26  --   BUN 19  --   CREATININE 0.87  --   GLUCOSE 118*  --   CALCIUM 8.7*  --    INR  --  1.1   DG Hand 2 View Left  Result Date: 10/02/2020 CLINICAL DATA:  Left hand pain after fall. EXAM: LEFT HAND - 2 VIEW COMPARISON:  None. FINDINGS: There is no evidence of fracture or dislocation. Moderate degenerative changes seen involving the first carpometacarpal joint. Soft tissues are unremarkable. 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 No acute abnormality seen in the left hand. Electronically Signed   By: Lupita Raider M.D.   On: 10/02/2020 09:52   DG Chest Portable 1 View  Result Date: 10/02/2020 CLINICAL DATA:  Left hip fracture. EXAM: PORTABLE CHEST 1 VIEW COMPARISON:  June 05, 2017. FINDINGS: Mild cardiomegaly is noted. No pneumothorax pleural effusion is noted. Increased diffuse interstitial densities are noted throughout both lungs which may represent diffuse scarring, but acute superimposed atypical inflammation or edema cannot be excluded. Old right rib fractures are noted. IMPRESSION: Increased diffuse interstitial densities are noted throughout both lungs which may represent diffuse scarring, but acute superimposed atypical inflammation or edema cannot be excluded. Electronically Signed   By: Lupita Raider M.D.   On: 10/02/2020 09:50   DG Hip Unilat W or Wo Pelvis 2-3 Views Left  Result Date: 10/02/2020 CLINICAL DATA:  Left hip pain after fall. EXAM: DG HIP (WITH OR WITHOUT PELVIS) 2-3V LEFT COMPARISON:  None. FINDINGS: Severely comminuted and displaced fracture is seen involving the intertrochanteric region of the proximal left femur. IMPRESSION: Severely comminuted and displaced intertrochanteric fracture of proximal left femur. Electronically Signed   By: Lupita Raider M.D.   On: 10/02/2020 09:49     Positive ROS: All other systems have been reviewed and were otherwise negative with the exception of those mentioned in the HPI and as above.  Physical Exam: BP 118/74   Pulse 91   Temp 97.9 F (36.6 C) (Oral)   Resp 16    Ht 5\' 5"  (1.651 m)   Wt 108.9 kg   SpO2 97%   BMI 39.94 kg/m  General:  Alert, no acute distress Psychiatric:  Patient is competent for consent with normal mood and affect   Cardiovascular:  No pedal edema, regular rate and rhythm Respiratory:  No wheezing, non-labored breathing GI:  Abdomen is soft and non-tender Skin:  No lesions in the area of chief complaint, no erythema Neurologic:  Sensation intact distally, CN grossly intact Lymphatic:  No axillary or cervical lymphadenopathy  Orthopedic Exam:  LLE: + DF/PF/EHL SILT grossly over foot Foot wwp +Log roll/axial load   X-rays:  As above: L intertrochanteric hip fracture  Assessment/Plan:  Jem Castro is a 87 y.o. female with a L intertrochanteric hip fracture.  Cardiology team recommending echocardiogram prior to surgical intervention.   1. I discussed the various treatment options including both surgical and non-surgical management of the fracture with the patient. We discussed the high risk of perioperative complications due to patient's age and other co-morbidities. After discussion of risks, benefits, and alternatives to surgery, the patient was in agreement to proceed with surgery.The goals of surgery would be to provide adequate pain relief and allow for mobilization. Plan for surgery is L hip cephalomedullary nailing tomorrow, 10/03/20 at ~12pm. 2. NPO after midnight 3. Hold anticoagulation in advance of OR 4. Admit to Hospitalist service   Signa Kell   10/02/2020 2:27 PM

## 2020-10-02 NOTE — H&P (Signed)
History and Physical    Monica [name] NMM:[id number] DOB: 1933-09-16 DOA: 10/02/2020  PCP: Patient, No Pcp Per   Patient coming from: Home  I have personally briefly reviewed patient's old medical records in [facility]  Chief Complaint: Fall                                Left hip pain  HPI: Monica [name] is a 87 y.o. female with medical history significant for aortic stenosis, coronary artery disease, dyslipidemia and obesity who presents to the ER via EMS for evaluation following a mechanical fall.  Patient states that she was walking around her bed to turn up the thermostat when she lost balance and fell into the dresser landing on her left side.  She was unable to get up or bear weight on the left leg and her daughter who heard her fall called 911.  She denied feeling dizzy or lightheaded prior to the fall and denied head trauma or loss of consciousness.  Per EMS her left lower extremity was shortened. Patient denies having any chest pain, no shortness of breath, no palpitations, no diaphoresis, no nausea, no vomiting, no abdominal pain, no diarrhea, no constipation, no fever, no chills, no cough, no sore throat, no urinary frequency, no nocturia, no dysuria, no headache, no blurred vision, weakness or focal deficits. Labs show sodium 136, potassium 4.2, chloride 104, bicarb 26, glucose 118, BUN 19, creatinine 0.87, calcium 8.7, BNP 178, troponin XIV, white count 10.9, hemoglobin 13.8, hematocrit 43.9, MCV 87.3, RDW 14.6, platelet count 196 Respiratory viral panel is negative Chest x-ray reviewed by me shows increased diffuse interstitial densities are noted throughout both lungs which may represent diffuse scarring, but acute superimposed atypical inflammation or edema cannot be excluded. Left hand x-ray shows moderate osteoarthritis of the first carpometacarpal joint. No acute abnormality seen in the left hand. Left femur x-ray shows severely comminuted and displaced  intertrochanteric fracture of proximal left femur. Twelve-lead EKG reviewed by me shows sinus rhythm    ED Course: Patient is an 87 year old Caucasian female who presents to the ER for evaluation following a mechanical fall at home and has a severely comminuted and displaced intertrochanteric fracture of the proximal left femur.  Orthopedic surgery was consulted in the ER and surgical repair is planned for 10/02/20.  She will be admitted to the hospital for further evaluation.  Review of Systems: As per HPI otherwise all other systems reviewed and negative.    Past Medical History:  Diagnosis Date  . Aortic stenosis    a. LHC 06/02/17: At least moderate aortic stenosis with a peak to peak gradient of 22 mmHg; b. TTE 05/2017: EF 55-60%, mild HK basal-midinferior wall, Gr1DD, mod to sev AS w/ mean gradient 21 mmHg, valve area 0.99, mild MR, mildly dilated LA   . CAD (coronary artery disease) 2018   a. inferior STEMI 06/02/2017: LHC 06/02/17: LM 20, D1 20%, o-pLCx 50, p-mRCA 100% s/p PCI/DES, mRCA 50, dRCA 30  . GI bleed    a. noted 06/05/2017  . HLD (hyperlipidemia)   . Obesity     Past Surgical History:  Procedure Laterality Date  . ANKLE RECONSTRUCTION  1956   also ORIF of right arm  . CORONARY STENT INTERVENTION N/A 06/01/2017   Procedure: Coronary/Graft Acute MI Revascularization;  Surgeon: Iran Ouch, MD;  Location: [facility] INVASIVE CV LAB;  Service: Cardiovascular;  Laterality: N/A;  . LEFT  HEART CATH AND CORONARY ANGIOGRAPHY N/A 06/01/2017   Procedure: LEFT HEART CATH AND CORONARY ANGIOGRAPHY;  Surgeon: Iran Ouch, MD;  Location: [facility] INVASIVE CV LAB;  Service: Cardiovascular;  Laterality: N/A;     reports that she quit smoking about 3 years ago. Her smoking use included cigarettes. She has a 25.00 pack-year smoking history. She has never used smokeless tobacco. She reports that she does not drink alcohol and does not use drugs.  No Known Allergies  Family History   Problem Relation Age of Onset  . Valvular heart disease Mother   . Diabetes Mellitus II Daughter       Prior to Admission medications   Medication Sig Start Date End Date Taking? Authorizing Provider  aspirin (ASPIRIN 81) 81 MG EC tablet Take 81 mg by mouth daily. Swallow whole.    [provider]  carvedilol (COREG) 3.125 MG tablet TAKE 1 TABLET BY MOUTH TWICE DAILY WITH A MEAL 07/24/20   Iran Ouch, MD  nitroGLYCERIN (NITROSTAT) 0.4 MG SL tablet Place 1 tablet (0.4 mg total) under the tongue every 5 (five) minutes as needed for chest pain. 06/04/17   Shaune Pollack, MD  rosuvastatin (CRESTOR) 10 MG tablet Take 1 tablet by mouth once daily 07/24/20   Iran Ouch, MD    Physical Exam: Vitals:   10/02/20 0902 10/02/20 1000 10/02/20 1100 10/02/20 1130  BP: (!) 160/98 (!) 160/112 136/73 140/80  Pulse:  88 80 83  Resp:  17 15 14   Temp:      TempSrc:      SpO2:  98% 100% 100%  Weight:      Height:         Vitals:   10/02/20 0902 10/02/20 1000 10/02/20 1100 10/02/20 1130  BP: (!) 160/98 (!) 160/112 136/73 140/80  Pulse:  88 80 83  Resp:  17 15 14   Temp:      TempSrc:      SpO2:  98% 100% 100%  Weight:      Height:          Constitutional: Alert and oriented x 3.  Appears uncomfortable but in painful distress  HEENT:      Head: Normocephalic and atraumatic.         Eyes: PERLA, EOMI, Conjunctivae are normal. Sclera is non-icteric.       Mouth/Throat: Mucous membranes are moist.       Neck: Supple with no signs of meningismus. Cardiovascular: Regular rate and rhythm. + Systolic ejection murmur, no gallops, or rubs. 2+ symmetrical distal pulses are present . No JVD. No LE edema Respiratory: Respiratory effort normal .Lungs sounds clear bilaterally. No wheezes, crackles, or rhonchi.  Gastrointestinal: Soft, non tender, and non distended with positive bowel sounds.  Genitourinary: No CVA tenderness. Musculoskeletal:  Decreased range of motion right hip ,  shortening of left lower extremity, swelling involving the left hand Neurologic:  Face is symmetric. Moving all extremities. No gross focal neurologic deficits  Skin: Skin is warm, dry.  No rash or ulcers Psychiatric: Mood and affect are normal    Labs on Admission: I have personally reviewed following labs and imaging studies  CBC: Recent Labs  Lab 10/02/20 0907  WBC 10.9*  NEUTROABS 8.1*  HGB 13.9  HCT 43.9  MCV 87.3  PLT 196   Basic Metabolic Panel: Recent Labs  Lab 10/02/20 0907  NA 136  K 4.2  CL 104  CO2 26  GLUCOSE 118*  BUN 19  CREATININE  0.87  CALCIUM 8.7*   GFR: Estimated Creatinine Clearance: 59.1 mL/min (by C-G formula based on SCr of 0.87 mg/dL). Liver Function Tests: No results for input(s): AST, ALT, ALKPHOS, BILITOT, PROT, ALBUMIN in the last 168 hours. No results for input(s): LIPASE, AMYLASE in the last 168 hours. No results for input(s): AMMONIA in the last 168 hours. Coagulation Profile: No results for input(s): INR, PROTIME in the last 168 hours. Cardiac Enzymes: No results for input(s): CKTOTAL, CKMB, CKMBINDEX, TROPONINI in the last 168 hours. BNP (last 3 results) No results for input(s): PROBNP in the last 8760 hours. HbA1C: No results for input(s): HGBA1C in the last 72 hours. CBG: No results for input(s): GLUCAP in the last 168 hours. Lipid Profile: No results for input(s): CHOL, HDL, LDLCALC, TRIG, CHOLHDL, LDLDIRECT in the last 72 hours. Thyroid Function Tests: No results for input(s): TSH, T4TOTAL, FREET4, T3FREE, THYROIDAB in the last 72 hours. Anemia Panel: No results for input(s): VITAMINB12, FOLATE, FERRITIN, TIBC, IRON, RETICCTPCT in the last 72 hours. Urine analysis:    Component Value Date/Time   COLORURINE YELLOW (A) 04/29/2018 1357   APPEARANCEUR CLEAR (A) 04/29/2018 1357   LABSPEC 1.010 04/29/2018 1357   PHURINE 7.0 04/29/2018 1357   GLUCOSEU NEGATIVE 04/29/2018 1357   HGBUR SMALL (A) 04/29/2018 1357   BILIRUBINUR  NEGATIVE 04/29/2018 1357   KETONESUR 5 (A) 04/29/2018 1357   PROTEINUR NEGATIVE 04/29/2018 1357   NITRITE NEGATIVE 04/29/2018 1357   LEUKOCYTESUR NEGATIVE 04/29/2018 1357    Radiological Exams on Admission: DG Hand 2 View Left  Result Date: 10/02/2020 CLINICAL DATA:  Left hand pain after fall. EXAM: LEFT HAND - 2 VIEW COMPARISON:  None. FINDINGS: There is no evidence of fracture or dislocation. Moderate degenerative changes seen involving the first carpometacarpal joint. Soft tissues are unremarkable. 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 No acute abnormality seen in the left hand. Electronically Signed   By: Lupita RaiderJames  Green Jr M.D.   On: 10/02/2020 09:52   DG Chest Portable 1 View  Result Date: 10/02/2020 CLINICAL DATA:  Left hip fracture. EXAM: PORTABLE CHEST 1 VIEW COMPARISON:  June 05, 2017. FINDINGS: Mild cardiomegaly is noted. No pneumothorax pleural effusion is noted. Increased diffuse interstitial densities are noted throughout both lungs which may represent diffuse scarring, but acute superimposed atypical inflammation or edema cannot be excluded. Old right rib fractures are noted. IMPRESSION: Increased diffuse interstitial densities are noted throughout both lungs which may represent diffuse scarring, but acute superimposed atypical inflammation or edema cannot be excluded. Electronically Signed   By: Lupita RaiderJames  Green Jr M.D.   On: 10/02/2020 09:50   DG Hip Unilat W or Wo Pelvis 2-3 Views Left  Result Date: 10/02/2020 CLINICAL DATA:  Left hip pain after fall. EXAM: DG HIP (WITH OR WITHOUT PELVIS) 2-3V LEFT COMPARISON:  None. FINDINGS: Severely comminuted and displaced fracture is seen involving the intertrochanteric region of the proximal left femur. IMPRESSION: Severely comminuted and displaced intertrochanteric fracture of proximal left femur. Electronically Signed   By: Lupita RaiderJames  Green Jr M.D.   On: 10/02/2020 09:49     Assessment/Plan Principal  Problem:   Closed comminuted intertrochanteric fracture of proximal end of left femur (HCC) Active Problems:   Aortic stenosis   Obesity   CAD (coronary artery disease)    Closed comminuted intertrochanteric fracture femur Status post mechanical fall Immobilize left lower extremity Pain control Start patient on muscle relaxants Consult orthopedic surgery    History of coronary artery disease Hold aspirin for  planned surgery Continue statins and carvedilol    Obesity (BMI 39 kg/m2) Complicates overall prognosis and care  DVT prophylaxis: SCD Code Status: full code Family Communication: Greater than 50% of time was spent discussing patient's condition and plan of care with her at the bedside.  All questions and concerns have been addressed.  She verbalizes understanding and agrees with the plan. Disposition Plan: Back to previous home environment Consults called: Orthopedic surgery Status: Inpatient.  The medical decision making for this patient was of high complexity and patient is at high risk for clinical deterioration during this hospitalization.    Lucile Shutters MD [facility]     10/02/2020, 12:27 PM

## 2020-10-02 NOTE — ED Triage Notes (Signed)
Pt arrived via EMS for mechanical fall.  Pt states she lost her balance and fell into the dresser.  C/O pain to  left hip/leg  and left hand.  Shortening noticed to left lower extremity.   CMS intact.   Pt denies head head injury, denies LOC.  Pt is AAO x4.

## 2020-10-03 ENCOUNTER — Inpatient Hospital Stay (HOSPITAL_COMMUNITY)
Admit: 2020-10-03 | Discharge: 2020-10-03 | Disposition: A | Discharge status: Home / Self Care | Payer: Medicare Other | Attending: Physician Assistant | Admitting: Physician Assistant

## 2020-10-03 ENCOUNTER — Inpatient Hospital Stay: Payer: Medicare Other | Admitting: Anesthesiology

## 2020-10-03 ENCOUNTER — Inpatient Hospital Stay: Payer: Medicare Other

## 2020-10-03 ENCOUNTER — Encounter: Payer: Self-pay | Admitting: Internal Medicine

## 2020-10-03 ENCOUNTER — Encounter
Admission: EM | Disposition: A | Discharge status: Skilled Nursing Facility | Payer: Self-pay | Source: Home / Self Care | Attending: Internal Medicine

## 2020-10-03 DIAGNOSIS — I340 Nonrheumatic mitral (valve) insufficiency: Secondary | ICD-10-CM

## 2020-10-03 DIAGNOSIS — I350 Nonrheumatic aortic (valve) stenosis: Secondary | ICD-10-CM | POA: Diagnosis not present

## 2020-10-03 DIAGNOSIS — I342 Nonrheumatic mitral (valve) stenosis: Secondary | ICD-10-CM | POA: Diagnosis not present

## 2020-10-03 HISTORY — PX: INTRAMEDULLARY (IM) NAIL INTERTROCHANTERIC: SHX5875

## 2020-10-03 LAB — ECHOCARDIOGRAM COMPLETE
AR max vel: 0.53 cm2
AV Area VTI: 0.51 cm2
AV Area mean vel: 0.53 cm2
AV Mean grad: 60 mmHg
AV Peak grad: 93.3 mmHg
Ao pk vel: 4.83 m/s
Area-P 1/2: 7.29 cm2
Height: 65 in
MV VTI: 1.93 cm2
S' Lateral: 2.5 cm
Weight: 3840 oz

## 2020-10-03 SURGERY — FIXATION, FRACTURE, INTERTROCHANTERIC, WITH INTRAMEDULLARY ROD
Anesthesia: General | Laterality: Left

## 2020-10-03 MED ORDER — PROPOFOL 10 MG/ML IV BOLUS
INTRAVENOUS | Status: AC
  Filled 2020-10-03: qty 20

## 2020-10-03 MED ORDER — FLEET ENEMA 7-19 GM/118ML RE ENEM: 1.0000 | ENEMA | Freq: Once | RECTAL | Status: DC | PRN

## 2020-10-03 MED ORDER — ETOMIDATE 2 MG/ML IV SOLN
INTRAVENOUS | Status: AC
  Filled 2020-10-03: qty 10

## 2020-10-03 MED ORDER — SUCCINYLCHOLINE CHLORIDE 20 MG/ML IJ SOLN
INTRAMUSCULAR | Status: DC | PRN
  Administered 2020-10-03: 100 mg via INTRAVENOUS

## 2020-10-03 MED ORDER — CEFAZOLIN SODIUM-DEXTROSE 2-4 GM/100ML-% IV SOLN
2.0000 g | Freq: Four times a day (QID) | INTRAVENOUS | Status: AC
  Administered 2020-10-03 – 2020-10-04 (×3): 2 g via INTRAVENOUS
  Filled 2020-10-03 (×3): qty 100

## 2020-10-03 MED ORDER — DEXAMETHASONE SODIUM PHOSPHATE 10 MG/ML IJ SOLN
INTRAMUSCULAR | Status: DC | PRN
  Administered 2020-10-03: 5 mg via INTRAVENOUS

## 2020-10-03 MED ORDER — ACETAMINOPHEN 500 MG PO TABS
1000.0000 mg | ORAL_TABLET | Freq: Three times a day (TID) | ORAL | Status: DC
  Administered 2020-10-03 – 2020-10-07 (×9): 1000 mg via ORAL
  Filled 2020-10-03 (×10): qty 2

## 2020-10-03 MED ORDER — ROCURONIUM BROMIDE 100 MG/10ML IV SOLN
INTRAVENOUS | Status: DC | PRN
  Administered 2020-10-03 (×3): 20 mg via INTRAVENOUS
  Administered 2020-10-03: 30 mg via INTRAVENOUS
  Administered 2020-10-03: 10 mg via INTRAVENOUS
  Administered 2020-10-03: 20 mg via INTRAVENOUS

## 2020-10-03 MED ORDER — METHOCARBAMOL 1000 MG/10ML IJ SOLN
500.0000 mg | Freq: Four times a day (QID) | INTRAVENOUS | Status: DC | PRN
  Filled 2020-10-03: qty 5

## 2020-10-03 MED ORDER — KETOROLAC TROMETHAMINE 15 MG/ML IJ SOLN
7.5000 mg | Freq: Four times a day (QID) | INTRAMUSCULAR | Status: AC
  Administered 2020-10-03 – 2020-10-04 (×4): 7.5 mg via INTRAVENOUS
  Filled 2020-10-03 (×4): qty 1

## 2020-10-03 MED ORDER — ONDANSETRON HCL 4 MG/2ML IJ SOLN
INTRAMUSCULAR | Status: DC | PRN
  Administered 2020-10-03: 4 mg via INTRAVENOUS

## 2020-10-03 MED ORDER — LIDOCAINE HCL (PF) 2 % IJ SOLN
INTRAMUSCULAR | Status: AC
  Filled 2020-10-03: qty 5

## 2020-10-03 MED ORDER — ENOXAPARIN SODIUM 40 MG/0.4ML ~~LOC~~ SOLN
40.0000 mg | SUBCUTANEOUS | Status: DC
  Administered 2020-10-04 – 2020-10-09 (×6): 40 mg via SUBCUTANEOUS
  Filled 2020-10-03 (×6): qty 0.4

## 2020-10-03 MED ORDER — SUCCINYLCHOLINE CHLORIDE 200 MG/10ML IV SOSY
PREFILLED_SYRINGE | INTRAVENOUS | Status: AC
  Filled 2020-10-03: qty 10

## 2020-10-03 MED ORDER — DEXAMETHASONE SODIUM PHOSPHATE 10 MG/ML IJ SOLN
INTRAMUSCULAR | Status: AC
  Filled 2020-10-03: qty 1

## 2020-10-03 MED ORDER — ASPIRIN EC 81 MG PO TBEC
81.0000 mg | DELAYED_RELEASE_TABLET | Freq: Every day | ORAL | Status: DC
  Administered 2020-10-04 – 2020-10-09 (×6): 81 mg via ORAL
  Filled 2020-10-03 (×6): qty 1

## 2020-10-03 MED ORDER — ONDANSETRON HCL 4 MG/2ML IJ SOLN
INTRAMUSCULAR | Status: AC
  Filled 2020-10-03: qty 2

## 2020-10-03 MED ORDER — ROCURONIUM BROMIDE 10 MG/ML (PF) SYRINGE
PREFILLED_SYRINGE | INTRAVENOUS | Status: AC
  Filled 2020-10-03: qty 10

## 2020-10-03 MED ORDER — SUGAMMADEX SODIUM 200 MG/2ML IV SOLN
INTRAVENOUS | Status: DC | PRN
  Administered 2020-10-03: 200 mg via INTRAVENOUS

## 2020-10-03 MED ORDER — SENNOSIDES-DOCUSATE SODIUM 8.6-50 MG PO TABS
1.0000 | ORAL_TABLET | Freq: Every evening | ORAL | Status: DC | PRN
  Filled 2020-10-03: qty 1

## 2020-10-03 MED ORDER — SODIUM CHLORIDE 0.9 % IV SOLN: INTRAVENOUS | Status: DC

## 2020-10-03 MED ORDER — ENSURE ENLIVE PO LIQD
237.0000 mL | Freq: Two times a day (BID) | ORAL | Status: DC
  Administered 2020-10-04 – 2020-10-09 (×10): 237 mL via ORAL
  Filled 2020-10-03: qty 237

## 2020-10-03 MED ORDER — PHENYLEPHRINE HCL (PRESSORS) 10 MG/ML IV SOLN
INTRAVENOUS | Status: AC
  Filled 2020-10-03: qty 1

## 2020-10-03 MED ORDER — CEFAZOLIN SODIUM 1 G IJ SOLR
INTRAMUSCULAR | Status: AC
  Filled 2020-10-03: qty 40

## 2020-10-03 MED ORDER — METOCLOPRAMIDE HCL 10 MG PO TABS: 5.0000 mg | ORAL_TABLET | Freq: Three times a day (TID) | ORAL | Status: DC | PRN

## 2020-10-03 MED ORDER — FENTANYL CITRATE (PF) 100 MCG/2ML IJ SOLN
25.0000 ug | INTRAMUSCULAR | Status: DC | PRN
  Administered 2020-10-03: 12.5 ug via INTRAVENOUS

## 2020-10-03 MED ORDER — BISACODYL 10 MG RE SUPP: 10.0000 mg | Freq: Every day | RECTAL | Status: DC | PRN

## 2020-10-03 MED ORDER — METOCLOPRAMIDE HCL 5 MG/ML IJ SOLN: 5.0000 mg | Freq: Three times a day (TID) | INTRAMUSCULAR | Status: DC | PRN

## 2020-10-03 MED ORDER — FENTANYL CITRATE (PF) 100 MCG/2ML IJ SOLN
INTRAMUSCULAR | Status: DC | PRN
  Administered 2020-10-03 (×3): 25 ug via INTRAVENOUS
  Administered 2020-10-03 (×2): 12.5 ug via INTRAVENOUS

## 2020-10-03 MED ORDER — OXYCODONE HCL 5 MG PO TABS
2.5000 mg | ORAL_TABLET | ORAL | Status: DC | PRN
  Administered 2020-10-04 (×2): 5 mg via ORAL
  Filled 2020-10-03 (×2): qty 1

## 2020-10-03 MED ORDER — METHOCARBAMOL 500 MG PO TABS
500.0000 mg | ORAL_TABLET | Freq: Four times a day (QID) | ORAL | Status: DC | PRN
  Administered 2020-10-03 – 2020-10-05 (×3): 500 mg via ORAL
  Filled 2020-10-03 (×3): qty 1

## 2020-10-03 MED ORDER — DOCUSATE SODIUM 100 MG PO CAPS
100.0000 mg | ORAL_CAPSULE | Freq: Two times a day (BID) | ORAL | Status: DC
  Administered 2020-10-03 – 2020-10-09 (×8): 100 mg via ORAL
  Filled 2020-10-03 (×10): qty 1

## 2020-10-03 MED ORDER — CHLORHEXIDINE GLUCONATE CLOTH 2 % EX PADS
6.0000 | MEDICATED_PAD | Freq: Every day | CUTANEOUS | Status: DC
  Administered 2020-10-04 – 2020-10-07 (×5): 6 via TOPICAL

## 2020-10-03 MED ORDER — PROPOFOL 10 MG/ML IV BOLUS
INTRAVENOUS | Status: AC
  Filled 2020-10-03: qty 40

## 2020-10-03 MED ORDER — VASOPRESSIN 20 UNIT/ML IV SOLN
INTRAVENOUS | Status: AC
  Filled 2020-10-03: qty 1

## 2020-10-03 MED ORDER — ONDANSETRON HCL 4 MG PO TABS: 4.0000 mg | ORAL_TABLET | Freq: Four times a day (QID) | ORAL | Status: DC | PRN

## 2020-10-03 MED ORDER — ACETAMINOPHEN 10 MG/ML IV SOLN
INTRAVENOUS | Status: DC | PRN
  Administered 2020-10-03: 1000 mg via INTRAVENOUS

## 2020-10-03 MED ORDER — SODIUM CHLORIDE 0.9 % IR SOLN
Status: DC | PRN
  Administered 2020-10-03: 1000 mL via TOPICAL

## 2020-10-03 MED ORDER — ONDANSETRON HCL 4 MG/2ML IJ SOLN: 4.0000 mg | Freq: Four times a day (QID) | INTRAMUSCULAR | Status: DC | PRN

## 2020-10-03 MED ORDER — BUPIVACAINE HCL (PF) 0.5 % IJ SOLN
INTRAMUSCULAR | Status: DC | PRN
  Administered 2020-10-03: 30 mL

## 2020-10-03 MED ORDER — LACTATED RINGERS IV SOLN: INTRAVENOUS | Status: DC

## 2020-10-03 MED ORDER — VASOPRESSIN 20 UNIT/ML IV SOLN
INTRAVENOUS | Status: DC | PRN
  Administered 2020-10-03 (×4): 1 [IU] via INTRAVENOUS
  Administered 2020-10-03: 2 [IU] via INTRAVENOUS
  Administered 2020-10-03 (×4): 1 [IU] via INTRAVENOUS

## 2020-10-03 MED ORDER — LIDOCAINE HCL (CARDIAC) PF 100 MG/5ML IV SOSY
PREFILLED_SYRINGE | INTRAVENOUS | Status: DC | PRN
  Administered 2020-10-03: 60 mg via INTRAVENOUS

## 2020-10-03 MED ORDER — OXYCODONE HCL 5 MG PO TABS
5.0000 mg | ORAL_TABLET | ORAL | Status: DC | PRN
  Administered 2020-10-05 – 2020-10-08 (×8): 10 mg via ORAL
  Administered 2020-10-08: 5 mg via ORAL
  Filled 2020-10-03 (×4): qty 2
  Filled 2020-10-03: qty 1
  Filled 2020-10-03 (×4): qty 2

## 2020-10-03 MED ORDER — SODIUM CHLORIDE 0.9 % IV SOLN
INTRAVENOUS | Status: DC | PRN
  Administered 2020-10-03: 20 ug/min via INTRAVENOUS

## 2020-10-03 MED ORDER — TRAMADOL HCL 50 MG PO TABS
50.0000 mg | ORAL_TABLET | Freq: Four times a day (QID) | ORAL | Status: DC | PRN
Start: 2020-10-03 — End: 2020-10-09
  Administered 2020-10-05 – 2020-10-09 (×4): 50 mg via ORAL
  Filled 2020-10-03 (×4): qty 1

## 2020-10-03 MED ORDER — BUPIVACAINE LIPOSOME 1.3 % IJ SUSP
INTRAMUSCULAR | Status: DC | PRN
  Administered 2020-10-03: 20 mL

## 2020-10-03 MED ORDER — CEFAZOLIN SODIUM-DEXTROSE 2-3 GM-%(50ML) IV SOLR
INTRAVENOUS | Status: DC | PRN
  Administered 2020-10-03: 2 g via INTRAVENOUS

## 2020-10-03 MED ORDER — FENTANYL CITRATE (PF) 100 MCG/2ML IJ SOLN
INTRAMUSCULAR | Status: AC
  Filled 2020-10-03: qty 2

## 2020-10-03 MED ORDER — HYDROMORPHONE HCL 1 MG/ML IJ SOLN
0.2500 mg | INTRAMUSCULAR | Status: DC | PRN
Start: 2020-10-03 — End: 2020-10-07

## 2020-10-03 MED ORDER — ONDANSETRON HCL 4 MG/2ML IJ SOLN: 4.0000 mg | Freq: Once | INTRAMUSCULAR | Status: DC | PRN

## 2020-10-03 MED ORDER — ADULT MULTIVITAMIN W/MINERALS CH
1.0000 | ORAL_TABLET | Freq: Every day | ORAL | Status: DC
  Administered 2020-10-04 – 2020-10-07 (×4): 1 via ORAL
  Filled 2020-10-03 (×7): qty 1

## 2020-10-03 MED ORDER — PERFLUTREN LIPID MICROSPHERE
1.0000 mL | INTRAVENOUS | Status: AC | PRN
  Administered 2020-10-03: 2 mL via INTRAVENOUS
  Filled 2020-10-03: qty 10

## 2020-10-03 MED ORDER — ETOMIDATE 2 MG/ML IV SOLN
INTRAVENOUS | Status: DC | PRN
  Administered 2020-10-03: 16 mg via INTRAVENOUS

## 2020-10-03 MED ORDER — CEFAZOLIN SODIUM-DEXTROSE 1-4 GM/50ML-% IV SOLN
INTRAVENOUS | Status: DC | PRN
  Administered 2020-10-03: 1 g via INTRAVENOUS

## 2020-10-03 MED ORDER — ACETAMINOPHEN 10 MG/ML IV SOLN
INTRAVENOUS | Status: AC
  Filled 2020-10-03: qty 100

## 2020-10-03 SURGICAL SUPPLY — 54 items
"PENCIL ELECTRO HAND CTR " (MISCELLANEOUS) ×1 IMPLANT
BIT DRILL INTERTAN LAG SCREW (BIT) ×1 IMPLANT
BIT DRILL SHORT 4.0 (BIT) IMPLANT
BLADE SURG 15 STRL LF DISP TIS (BLADE) ×1 IMPLANT
BLADE SURG 15 STRL SS (BLADE) ×1
CANISTER SUCT 1200ML W/VALVE (MISCELLANEOUS) ×2 IMPLANT
CHLORAPREP W/TINT 26 (MISCELLANEOUS) ×3 IMPLANT
COVER WAND RF STERILE (DRAPES) ×2 IMPLANT
DRAPE 3/4 80X56 (DRAPES) ×2 IMPLANT
DRAPE SURG 17X11 SM STRL (DRAPES) ×4 IMPLANT
DRAPE U-SHAPE 47X51 STRL (DRAPES) ×4 IMPLANT
DRILL BIT SHORT 4.0 (BIT) ×1
DRSG OPSITE POSTOP 3X4 (GAUZE/BANDAGES/DRESSINGS) ×6 IMPLANT
DRSG OPSITE POSTOP 4X6 (GAUZE/BANDAGES/DRESSINGS) ×2 IMPLANT
DRSG OPSITE POSTOP 4X8 (GAUZE/BANDAGES/DRESSINGS) ×1 IMPLANT
ELECT REM PT RETURN 9FT ADLT (ELECTROSURGICAL) ×2
ELECTRODE REM PT RTRN 9FT ADLT (ELECTROSURGICAL) ×1 IMPLANT
GLOVE SRG 8 PF TXTR STRL LF DI (GLOVE) ×1 IMPLANT
GLOVE SURG SYN 7.5  E (GLOVE) ×1
GLOVE SURG SYN 7.5 E (GLOVE) ×1 IMPLANT
GLOVE SURG SYN 7.5 PF PI (GLOVE) ×1 IMPLANT
GLOVE SURG UNDER POLY LF SZ8 (GLOVE) ×1
GOWN STRL REUS W/ TWL LRG LVL3 (GOWN DISPOSABLE) ×1 IMPLANT
GOWN STRL REUS W/ TWL XL LVL3 (GOWN DISPOSABLE) ×1 IMPLANT
GOWN STRL REUS W/TWL LRG LVL3 (GOWN DISPOSABLE) ×1
GOWN STRL REUS W/TWL XL LVL3 (GOWN DISPOSABLE) ×1
GUIDE PIN 3.2X343 (PIN) ×4
GUIDE PIN 3.2X343MM (PIN) ×4
GUIDE ROD 3.0 (MISCELLANEOUS) ×6
KIT PATIENT CARE HANA TABLE (KITS) ×2 IMPLANT
KIT TURNOVER KIT A (KITS) ×2 IMPLANT
MANIFOLD NEPTUNE II (INSTRUMENTS) ×2 IMPLANT
MAT ABSORB  FLUID 56X50 GRAY (MISCELLANEOUS) ×2
MAT ABSORB FLUID 56X50 GRAY (MISCELLANEOUS) ×2 IMPLANT
NAIL LEFT 10X36 (Nail) ×1 IMPLANT
NDL FILTER BLUNT 18X1 1/2 (NEEDLE) ×1 IMPLANT
NEEDLE FILTER BLUNT 18X 1/2SAF (NEEDLE) ×1
NEEDLE FILTER BLUNT 18X1 1/2 (NEEDLE) ×1 IMPLANT
NEEDLE HYPO 22GX1.5 SAFETY (NEEDLE) ×2 IMPLANT
NS IRRIG 1000ML POUR BTL (IV SOLUTION) ×2 IMPLANT
PACK HIP COMPR (MISCELLANEOUS) ×2 IMPLANT
PENCIL ELECTRO HAND CTR (MISCELLANEOUS) ×2 IMPLANT
PIN GUIDE 3.2X343MM (PIN) IMPLANT
ROD GUIDE 3.0 (MISCELLANEOUS) IMPLANT
SCREW LAG COMPR KIT 100/95 (Screw) ×1 IMPLANT
SCREW TRIGEN LOW PROF 5.0X40 (Screw) ×1 IMPLANT
SCREW TRIGEN LOW PROF 5.0X47.5 (Screw) ×1 IMPLANT
SPONGE LAP 18X18 RF (DISPOSABLE) ×1 IMPLANT
STAPLER SKIN PROX 35W (STAPLE) ×2 IMPLANT
SUT VIC AB 0 CT1 36 (SUTURE) ×2 IMPLANT
SUT VIC AB 2-0 CT2 27 (SUTURE) ×3 IMPLANT
SYR 10ML LL (SYRINGE) ×2 IMPLANT
SYR 30ML LL (SYRINGE) ×2 IMPLANT
TAPE CLOTH 3X10 WHT NS LF (GAUZE/BANDAGES/DRESSINGS) ×4 IMPLANT

## 2020-10-03 NOTE — Plan of Care (Signed)

## 2020-10-03 NOTE — Clinical Social Work Note (Signed)
CSW acknowledges SNF consult. Please enter orders for PT and OT so patient can be evaluated for appropriateness.  Telesha Deguzman, CSW [phone]

## 2020-10-03 NOTE — Progress Notes (Signed)
Anesthesia paged. Dr Earnie Larsson MD at bedside and is aware that her pressures are in the 90's systolic. Wants to keep her MAP above 65. Patient is agitated and swinging at staff. Pulling off monitors, and hitting staff. Cursing staff. Reassured patient that she is safe, and that we are trying to help her. Notified Dr. Allena Katz of patients feet being dusky and mottled in color. He is aware and it is only in her feet. Pulses are palpable bilaterally and marked with a skin marker. 1+. Bilaterally and are symmetrical.

## 2020-10-03 NOTE — H&P (Addendum)
H&P reviewed. No significant changes noted.  

## 2020-10-03 NOTE — Progress Notes (Signed)
Patient arterial line removed at 1800. Site is clean dry intact with pressure dressing. Patient is much more calm now and is resting well. Notified Dr. Earnie Larsson of patient low urine output of 30 ml. Amber in color. See order for 500 ml bolus. Pressures are stable.

## 2020-10-03 NOTE — Progress Notes (Signed)
*  PRELIMINARY RESULTS* Echocardiogram 2D Echocardiogram has been performed.  Monica [name] 10/03/2020, 9:20 AM

## 2020-10-03 NOTE — Anesthesia Procedure Notes (Signed)
Arterial Line Insertion Start/End2/22/2022 12:25 PM, 10/03/2020 12:30 PM Performed by: Yves Dill, MD, Elmarie Mainland, CRNA, CRNA  Preanesthetic checklist: patient identified, IV checked, site marked, risks and benefits discussed, surgical consent, monitors and equipment checked, pre-op evaluation, timeout performed and anesthesia consent radial was placed Catheter size: 20 G Hand hygiene performed   Attempts: 2 Procedure performed without using ultrasound guided technique. Post procedure complications: local hematoma. Patient tolerated the procedure well with no immediate complications.

## 2020-10-03 NOTE — Op Note (Signed)
DATE OF SURGERY: 10/03/2020  PREOPERATIVE DIAGNOSIS: Left intertrochanteric hip fracture  POSTOPERATIVE DIAGNOSIS: Left intertrochanteric hip fracture  PROCEDURE: Intramedullary nailing of L femur with cephalomedullary device  SURGEON: Rosealee Albee, MD  ANESTHESIA: Gen  EBL: 400 cc  IVF: per anesthesia record  COMPONENTS:  [employer] Trigen Intertan Long Nail: 10x338mm; lag screw with 85mm compression screw; 5x 9mm & 47.11mm distal cortical interlocking screws  INDICATIONS: Monica [name] is a 87 y.o. female who sustained an intertrochanteric fracture after a fall. Risks and benefits of intramedullary nailing were explained to the patient and/or family . Risks include but are not limited to bleeding, infection, injury to tissues, nerves, vessels, nonunion/malunion, hardware failure, limb length discrepancy/hip rotation mismatch and risks of anesthesia. The patient and/or family understand these risks, have completed an informed consent, and wish to proceed.   PROCEDURE:  The patient was brought into the operating room. After administering anesthesia, the patient was placed in the supine position on the Hana table. The uninjured leg was placed in an extended position while the injured lower extremity was placed in longitudinal traction. The fracture was reduced using longitudinal traction and appropriate rotation. The adequacy of reduction was verified fluoroscopically in AP and lateral projections and found to be acceptable. The lateral aspect of the hip and thigh were prepped with ChloraPrep solution before being draped sterilely. Preoperative IV antibiotics were administered. A timeout was performed to verify the appropriate surgical site, patient, and procedure.    The greater trochanter was identified and an approximately 10 cm incision was made about 4 fingerbreadths above the tip of the greater trochanter.  This is more proximal and a longer the incision than usual due to  the patient's BMI and significant soft tissue about the lateral aspect of the hip.  Dissection was carried down through the subcutaneous tissues to expose the gluteal fascia. This was split the length of the incision, providing access to the tip of the trochanter. Under fluoroscopic guidance, a guidewire was drilled through the tip of the trochanter into the proximal metaphysis to the level of the lesser trochanter.  This required significant effort and a spoon retractor had to be utilized to allow for a ball-tipped guidewire guidewire to enter the femoral shaft.  The starting point had to be within the fracture site itself as opposed to the more medial aspect of the tip of the greater trochanter due to inability to obtain access to this region due to the patient's size.  The incision was extended further proximally in an attempt to allow for better trajectory.  After verifying the position of the guidewire fluoroscopically in AP and lateral projections, it was overreamed with the opening reamer.  However due to the angle of entry, the opening reamer bent the guidewire.  Therefore guidewire was replaced and flexible reamers were used to ream down to the level of the lesser trochanter. The guidewire was then overreamed down to the supracondylar region sequentially using the flexible reamers. The nail was selected and advanced to the appropriate depth as verified fluoroscopically.    The guide system for the lag screw was positioned and advanced through an approximately 5cm incision over the lateral aspect of the proximal femur. The guidewire was drilled up through the femoral nail and into the femoral neck to rest within 5 mm of subchondral bone. After verifying its position in the femoral neck and head in both AP and lateral projections, the guidewire was measured and appropriate sized lag screw was  selected.  The channel for the compression screw was drilled and antirotation bar was placed.  Lag screw was drilled  and placed in appropriate position.  Compression screw was then placed.  Appropriate compression was achieved.  The set screw was locked in place.  In order to lock the set screw, incision had to be extended again more proximally to allow for appropriate trajectory of the screwdriver.  Incision was now approximately 20 cm in length.  Traction was released.   Attention was directed distally. Using the "perfect circle" technique, the leg and fluoroscopy machine were positioned appropriately. A 2cm stab incision was made over the skin and IT band at the appropriate point before the drill bit was advanced through the cortex and across the static hole of the nail. Appropriate screw length was determined with a measuring guide.  2 distal interlocking screws were placed. Again, the adequacy of screw position was verified fluoroscopically in AP and lateral projections.   The wounds were irrigated thoroughly with sterile saline solution. Local anesthetic was injected into the wounds. Deep fascia was closed with 0-Vicryl. The subcutaneous tissues were closed using 2-0 Vicryl interrupted sutures. The skin was closed using staples. Sterile occlusive dressings were applied to all wounds. The patient was then transferred to the recovery room in satisfactory condition.   Of note, this case had significantly added complexity compared to standard intramedullary nailing of an intertrochanteric femur fracture.  This was primarily due to to the patient's weight as most of her weight was about her hips.  This caused significant difficulty in obtaining the start point.  Once the start point was achieved, standard opening reamer cannot be passed due to the trajectory because of the soft tissue about the hip.  Furthermore, locking of the setscrew required significant effort and extension of the incision distally.  Lastly, closure of the wounds was significant longer due to the length of the incisions.  All told, these factors caused  this surgery to take approximately 120 minutes longer than usual, which correlates to approximately 2-3 times the normal operative time.  POSTOPERATIVE PLAN: The patient will be FFWB x 4 weeks on the operative extremity. Lovenox 40mg /day x 4 weeks to start on POD#1. Perioperative IV antibiotics x 24 hours. PT/OT on POD#1.

## 2020-10-03 NOTE — Anesthesia Postprocedure Evaluation (Signed)
Anesthesia Post Note  Patient: Monica [name]  Procedure(s) Performed: INTRAMEDULLARY (IM) NAIL INTERTROCHANTRIC (Left )  Patient location during evaluation: PACU Anesthesia Type: General Level of consciousness: awake and alert Pain management: pain level controlled Vital Signs Assessment: post-procedure vital signs reviewed and stable Respiratory status: spontaneous breathing, nonlabored ventilation, respiratory function stable and patient connected to nasal cannula oxygen Cardiovascular status: blood pressure returned to baseline and stable Postop Assessment: no apparent nausea or vomiting Anesthetic complications: no   No complications documented.   Last Vitals:  Vitals:   10/03/20 1842 10/03/20 1843  BP:  108/64  Pulse: 78 83  Resp: 17 18  Temp:    SpO2:  94%    Last Pain:  Vitals:   10/03/20 1807  TempSrc:   PainSc: Asleep                 Cleda Mccreedy Heitor Steinhoff

## 2020-10-03 NOTE — Progress Notes (Signed)
PROGRESS NOTE    Monica [name]  WNU:[id number] DOB: Sep 03, 1933 DOA: 10/02/2020 PCP: Patient, No Pcp Per     Brief Narrative:  Monica [name] is a 87 y.o. WF PMHx aortic stenosis, CAD, dyslipidemia, obesity   Presents to the ER via EMS for evaluation following a mechanical fall.  Patient states that she was walking around her bed to turn up the thermostat when she lost balance and fell into the dresser landing on her left side.  She was unable to get up or bear weight on the left leg and her daughter who heard her fall called 911.  She denied feeling dizzy or lightheaded prior to the fall and denied head trauma or loss of consciousness.  Per EMS her left lower extremity was shortened. Patient denies having any chest pain, no shortness of breath, no palpitations, no diaphoresis, no nausea, no vomiting, no abdominal pain, no diarrhea, no constipation, no fever, no chills, no cough, no sore throat, no urinary frequency, no nocturia, no dysuria, no headache, no blurred vision, weakness or focal deficits. Labs show sodium 136, potassium 4.2, chloride 104, bicarb 26, glucose 118, BUN 19, creatinine 0.87, calcium 8.7, BNP 178, troponin XIV, white count 10.9, hemoglobin 13.8, hematocrit 43.9, MCV 87.3, RDW 14.6, platelet count 196 Respiratory viral panel is negative Chest x-ray reviewed by me shows increased diffuse interstitial densities are noted throughout both lungs which may represent diffuse scarring, but acute superimposed atypical inflammation or edema cannot be excluded. Left hand x-ray shows moderate osteoarthritis of the first carpometacarpal joint. No acute abnormality seen in the left hand. Left femur x-ray shows severely comminuted and displaced intertrochanteric fracture of proximal left femur. Twelve-lead EKG reviewed by me shows sinus rhythm    ED Course: Patient is an 87 year old Caucasian female who presents to the ER for evaluation following a mechanical fall at home and has a  severely comminuted and displaced intertrochanteric fracture of the proximal left femur.  Orthopedic surgery was consulted in the ER and surgical repair is planned for 10/02/20.  She will be admitted to the hospital for further evaluation.    Subjective: Attempted twice to see patient but still in operating room no charge   Assessment & Plan: Covid vaccination;   Principal Problem:   Closed comminuted intertrochanteric fracture of proximal end of left femur (HCC) Active Problems:   Aortic stenosis   Obesity   CAD (coronary artery disease)   Closed comminuted intertrochanteric fracture of proximal end of left femur (HCC) Active Problems:   Aortic stenosis   Obesity   CAD (coronary artery disease)    Closed comminuted intertrochanteric fracture femur Status post mechanical fall Immobilize left lower extremity Pain control Start patient on muscle relaxants Consult orthopedic surgery    History of coronary artery disease Hold aspirin for planned surgery Continue statins and carvedilol    Obesity (BMI 39 kg/m2) Complicates overall prognosis and care   DVT prophylaxis: SCD Code Status: Full Family Communication:  Status is: Inpatient    Dispo: The patient is from: Home              Anticipated d/c is to: Full              Anticipated d/c date is: Per surgery              Patient currently unstable      Consultants:  Orthopedic surgery  Procedures/Significant Events:    I have personally reviewed and interpreted all radiology studies and my findings  are as above.  VENTILATOR SETTINGS:    Cultures   Antimicrobials:    Devices    LINES / TUBES:      Continuous Infusions: . lactated ringers 50 mL/hr at 10/03/20 1149  . [MAR Hold] methocarbamol (ROBAXIN) IV       Objective: Vitals:   10/03/20 0352 10/03/20 0712 10/03/20 0745 10/03/20 1058  BP: 105/70  95/71 132/90  Pulse: 94  89 92  Resp: 18  16 16   Temp: 97.8 F (36.6  C)  98.3 F (36.8 C) 98.2 F (36.8 C)  TempSrc:   Oral Oral  SpO2: 97% 97% 98% 98%  Weight:      Height:        Intake/Output Summary (Last 24 hours) at 10/03/2020 1225 Last data filed at 10/03/2020 0300 Gross per 24 hour  Intake 480 ml  Output 500 ml  Net -20 ml   Filed Weights   10/02/20 0859  Weight: 108.9 kg    Examination:  Attempted to evaluate patient on 2 different occasions however still in OR.  No charge .     Data Reviewed: Care during the described time interval was provided by me .  I have reviewed this patient's available data, including medical history, events of note, physical examination, and all test results as part of my evaluation.  CBC: Recent Labs  Lab 10/02/20 0907  WBC 10.9*  NEUTROABS 8.1*  HGB 13.9  HCT 43.9  MCV 87.3  PLT 196   Basic Metabolic Panel: Recent Labs  Lab 10/02/20 0907  NA 136  K 4.2  CL 104  CO2 26  GLUCOSE 118*  BUN 19  CREATININE 0.87  CALCIUM 8.7*   GFR: Estimated Creatinine Clearance: 59.1 mL/min (by C-G formula based on SCr of 0.87 mg/dL). Liver Function Tests: No results for input(s): AST, ALT, ALKPHOS, BILITOT, PROT, ALBUMIN in the last 168 hours. No results for input(s): LIPASE, AMYLASE in the last 168 hours. No results for input(s): AMMONIA in the last 168 hours. Coagulation Profile: Recent Labs  Lab 10/02/20 1153  INR 1.1   Cardiac Enzymes: No results for input(s): CKTOTAL, CKMB, CKMBINDEX, TROPONINI in the last 168 hours. BNP (last 3 results) No results for input(s): PROBNP in the last 8760 hours. HbA1C: No results for input(s): HGBA1C in the last 72 hours. CBG: No results for input(s): GLUCAP in the last 168 hours. Lipid Profile: No results for input(s): CHOL, HDL, LDLCALC, TRIG, CHOLHDL, LDLDIRECT in the last 72 hours. Thyroid Function Tests: No results for input(s): TSH, T4TOTAL, FREET4, T3FREE, THYROIDAB in the last 72 hours. Anemia Panel: No results for input(s): VITAMINB12, FOLATE,  FERRITIN, TIBC, IRON, RETICCTPCT in the last 72 hours. Sepsis Labs: No results for input(s): PROCALCITON, LATICACIDVEN in the last 168 hours.  Recent Results (from the past 240 hour(s))  Resp Panel by RT-PCR (Flu A&B, Covid) Nasopharyngeal Swab     Status: None   Collection Time: 10/02/20  9:07 AM   Specimen: Nasopharyngeal Swab; Nasopharyngeal(NP) swabs in vial transport medium  Result Value Ref Range Status   SARS Coronavirus 2 by RT PCR NEGATIVE NEGATIVE Final    Comment: (NOTE) SARS-CoV-2 target nucleic acids are NOT DETECTED.  The SARS-CoV-2 RNA is generally detectable in upper respiratory specimens during the acute phase of infection. The lowest concentration of SARS-CoV-2 viral copies this assay can detect is 138 copies/mL. A negative result does not preclude SARS-Cov-2 infection and should not be used as the sole basis for treatment or other patient  management decisions. A negative result may occur with  improper specimen collection/handling, submission of specimen other than nasopharyngeal swab, presence of viral mutation(s) within the areas targeted by this assay, and inadequate number of viral copies(<138 copies/mL). A negative result must be combined with clinical observations, patient history, and epidemiological information. The expected result is Negative.  Fact Sheet for Patients:  BloggerCourse.com  Fact Sheet for Healthcare Providers:  SeriousBroker.it  This test is no t yet approved or cleared by the [place] FDA and  has been authorized for detection and/or diagnosis of SARS-CoV-2 by FDA under an Emergency Use Authorization (EUA). This EUA will remain  in effect (meaning this test can be used) for the duration of the COVID-19 declaration under Section 564(b)(1) of the Act, 21 U.S.C.section 360bbb-3(b)(1), unless the authorization is terminated  or revoked sooner.       Influenza A by PCR NEGATIVE  NEGATIVE Final   Influenza B by PCR NEGATIVE NEGATIVE Final    Comment: (NOTE) The Xpert Xpress SARS-CoV-2/FLU/RSV plus assay is intended as an aid in the diagnosis of influenza from Nasopharyngeal swab specimens and should not be used as a sole basis for treatment. Nasal washings and aspirates are unacceptable for Xpert Xpress SARS-CoV-2/FLU/RSV testing.  Fact Sheet for Patients: BloggerCourse.com  Fact Sheet for Healthcare Providers: SeriousBroker.it  This test is not yet approved or cleared by the [place] FDA and has been authorized for detection and/or diagnosis of SARS-CoV-2 by FDA under an Emergency Use Authorization (EUA). This EUA will remain in effect (meaning this test can be used) for the duration of the COVID-19 declaration under Section 564(b)(1) of the Act, 21 U.S.C. section 360bbb-3(b)(1), unless the authorization is terminated or revoked.  Performed at [facility], [address] [place], [place] [address]          Radiology Studies: DG Hand 2 View Left  Result Date: 10/02/2020 CLINICAL DATA:  Left hand pain after fall. EXAM: LEFT HAND - 2 VIEW COMPARISON:  None. FINDINGS: There is no evidence of fracture or dislocation. Moderate degenerative changes seen involving the first carpometacarpal joint. Soft tissues are unremarkable. 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 No acute abnormality seen in the left hand. Electronically Signed   By: Lupita Raider M.D.   On: 10/02/2020 09:52   DG Chest Portable 1 View  Result Date: 10/02/2020 CLINICAL DATA:  Left hip fracture. EXAM: PORTABLE CHEST 1 VIEW COMPARISON:  June 05, 2017. FINDINGS: Mild cardiomegaly is noted. No pneumothorax pleural effusion is noted. Increased diffuse interstitial densities are noted throughout both lungs which may represent diffuse scarring, but acute superimposed atypical inflammation or edema  cannot be excluded. Old right rib fractures are noted. IMPRESSION: Increased diffuse interstitial densities are noted throughout both lungs which may represent diffuse scarring, but acute superimposed atypical inflammation or edema cannot be excluded. Electronically Signed   By: Lupita Raider M.D.   On: 10/02/2020 09:50   ECHOCARDIOGRAM COMPLETE  Result Date: 10/03/2020    ECHOCARDIOGRAM REPORT   Patient Name:   Monica [name] Date of Exam: 10/03/2020 Medical Rec #:  [id number]      Height:       65.0 in Accession #:    [id number]     Weight:       240.0 lb Date of Birth:  12/16/1933      BSA:          2.138 m Patient Age:    20 years  BP:           95/71 mmHg Patient Gender: F              HR:           86 bpm. Exam Location:  [facility] Procedure: 2D Echo, Color Doppler, Cardiac Doppler and Intracardiac            Opacification Agent Indications:     I35.0 Aortic stenosis  History:         Patient has prior history of Echocardiogram examinations, most                  recent 10/26/2019. CAD, Signs/Symptoms:Broken hip; Risk                  Factors:Dyslipidemia.  Sonographer:     Humphrey Rolls RDCS (AE) Referring Phys:  [id number] Sondra Barges Diagnosing Phys: Yvonne Kendall MD  Sonographer Comments: Suboptimal apical window. Image acquisition challenging due to patient body habitus. IMPRESSIONS  1. Left ventricular ejection fraction, by estimation, is 60 to 65%. The left ventricle has normal function. Left ventricular endocardial border not optimally defined to evaluate regional wall motion. There is mild left ventricular hypertrophy. Left ventricular diastolic parameters are consistent with Grade I diastolic dysfunction (impaired relaxation). Elevated left atrial pressure.  2. Right ventricular systolic function is normal. The right ventricular size is mildly enlarged. Tricuspid regurgitation signal is inadequate for assessing PA pressure.  3. Left atrial size was mildly dilated.  4. Right atrial size was mildly  dilated.  5. The mitral valve is degenerative. Trivial mitral valve regurgitation. Mild mitral stenosis. Moderate mitral annular calcification.  6. Tricuspid valve regurgitation at least moderate but suboptimally visualized.  7. The aortic valve is tricuspid. There is moderate calcification of the aortic valve. There is severe thickening of the aortic valve. Aortic valve regurgitation is not visualized. Severe aortic valve stenosis. Aortic valve area, by VTI measures 0.51 cm. Aortic valve mean gradient measures 60.0 mmHg. Aortic valve Vmax measures 4.83 m/s.  8. The inferior vena cava is normal in size with greater than 50% respiratory variability, suggesting right atrial pressure of 3 mmHg. Comparison(s): Very severe aortic stenosis, worsened compared with prior echocardiogram from 10/26/2019. FINDINGS  Left Ventricle: Left ventricular ejection fraction, by estimation, is 60 to 65%. The left ventricle has normal function. Left ventricular endocardial border not optimally defined to evaluate regional wall motion. Definity contrast agent was given IV to delineate the left ventricular endocardial borders. The left ventricular internal cavity size was normal in size. There is mild left ventricular hypertrophy. Left ventricular diastolic parameters are consistent with Grade I diastolic dysfunction (impaired relaxation). Elevated left atrial pressure. Right Ventricle: The right ventricular size is mildly enlarged. No increase in right ventricular wall thickness. Right ventricular systolic function is normal. Tricuspid regurgitation signal is inadequate for assessing PA pressure. Left Atrium: Left atrial size was mildly dilated. Right Atrium: Right atrial size was mildly dilated. Pericardium: There is no evidence of pericardial effusion. Mitral Valve: The mitral valve is degenerative in appearance. There is mild thickening of the mitral valve leaflet(s). Moderate mitral annular calcification. Trivial mitral valve  regurgitation. Mild mitral valve stenosis. MV peak gradient, 11.2 mmHg. The  mean mitral valve gradient is 5.0 mmHg. Tricuspid Valve: The tricuspid valve is not well visualized. Tricuspid valve regurgitation at least moderate but suboptimally visualized. Aortic Valve: The aortic valve is tricuspid. There is moderate calcification of the aortic valve. There is severe thickening of  the aortic valve. Aortic valve regurgitation is not visualized. Severe aortic stenosis is present. Aortic valve mean gradient measures 60.0 mmHg. Aortic valve peak gradient measures 93.3 mmHg. Aortic valve area, by VTI measures 0.51 cm. Pulmonic Valve: The pulmonic valve was not well visualized. Pulmonic valve regurgitation is not visualized. No evidence of pulmonic stenosis. Aorta: The aortic root is normal in size and structure. Pulmonary Artery: The pulmonary artery is not well seen. Venous: The inferior vena cava is normal in size with greater than 50% respiratory variability, suggesting right atrial pressure of 3 mmHg. IAS/Shunts: The interatrial septum was not well visualized.  LEFT VENTRICLE PLAX 2D LVIDd:         4.00 cm  Diastology LVIDs:         2.50 cm  LV e' medial:    8.38 cm/s LV PW:         1.36 cm  LV E/e' medial:  13.8 LV IVS:        1.09 cm  LV e' lateral:   5.44 cm/s LVOT diam:     1.90 cm  LV E/e' lateral: 21.3 LV SV:         51 LV SV Index:   24 LVOT Area:     2.84 cm  RIGHT VENTRICLE RV Basal diam:  4.60 cm LEFT ATRIUM           Index       RIGHT ATRIUM           Index LA diam:      3.90 cm 1.82 cm/m  RA Area:     23.00 cm LA Vol (A4C): 69.3 ml 32.42 ml/m RA Volume:   76.20 ml  35.65 ml/m  AORTIC VALVE                    PULMONIC VALVE AV Area (Vmax):    0.53 cm     PV Vmax:       1.23 m/s AV Area (Vmean):   0.53 cm     PV Vmean:      80.200 cm/s AV Area (VTI):     0.51 cm     PV VTI:        0.229 m AV Vmax:           483.00 cm/s  PV Peak grad:  6.1 mmHg AV Vmean:          346.000 cm/s PV Mean grad:  3.0 mmHg  AV VTI:            1.007 m AV Peak Grad:      93.3 mmHg AV Mean Grad:      60.0 mmHg LVOT Vmax:         89.50 cm/s LVOT Vmean:        64.100 cm/s LVOT VTI:          0.181 m LVOT/AV VTI ratio: 0.18  AORTA Ao Root diam: 3.40 cm MITRAL VALVE MV Area (PHT): 7.29 cm     SHUNTS MV Area VTI:   1.93 cm     Systemic VTI:  0.18 m MV Peak grad:  11.2 mmHg    Systemic Diam: 1.90 cm MV Mean grad:  5.0 mmHg MV Vmax:       1.67 m/s MV Vmean:      102.0 cm/s MV Decel Time: 104 msec MV E velocity: 116.00 cm/s MV A velocity: 164.00 cm/s MV E/A ratio:  0.71 Cristal Deer End MD Electronically signed by  Yvonne Kendall MD Signature Date/Time: 10/03/2020/9:54:38 AM    Final    DG Hip Unilat W or Wo Pelvis 2-3 Views Left  Result Date: 10/02/2020 CLINICAL DATA:  Left hip pain after fall. EXAM: DG HIP (WITH OR WITHOUT PELVIS) 2-3V LEFT COMPARISON:  None. FINDINGS: Severely comminuted and displaced fracture is seen involving the intertrochanteric region of the proximal left femur. IMPRESSION: Severely comminuted and displaced intertrochanteric fracture of proximal left femur. Electronically Signed   By: Lupita Raider M.D.   On: 10/02/2020 09:49        Scheduled Meds: . [MAR Hold] carvedilol  3.125 mg Oral BID WC  . [MAR Hold] rosuvastatin  10 mg Oral Daily  . [MAR Hold] senna  1 tablet Oral BID   Continuous Infusions: . lactated ringers 50 mL/hr at 10/03/20 1149  . [MAR Hold] methocarbamol (ROBAXIN) IV       LOS: 1 day    Time spent:40 min    WOODS, Roselind Messier, MD [facility]   If 7PM-7AM, please contact night-coverage 10/03/2020, 12:25 PM

## 2020-10-03 NOTE — Anesthesia Procedure Notes (Signed)
Procedure Name: Intubation Date/Time: 10/03/2020 12:05 PM Performed by: Elmarie Mainland, CRNA Pre-anesthesia Checklist: Patient identified, Emergency Drugs available, Suction available and Patient being monitored Patient Re-evaluated:Patient Re-evaluated prior to induction Oxygen Delivery Method: Circle system utilized Preoxygenation: Pre-oxygenation with 100% oxygen Induction Type: IV induction Ventilation: Mask ventilation without difficulty Laryngoscope Size: McGraph and 3 Tube type: Oral Tube size: 7.0 mm Number of attempts: 1 Airway Equipment and Method: Stylet and Oral airway Placement Confirmation: ETT inserted through vocal cords under direct vision,  positive ETCO2 and breath sounds checked- equal and bilateral Secured at: 20 cm Tube secured with: Tape Dental Injury: Teeth and Oropharynx as per pre-operative assessment

## 2020-10-03 NOTE — Transfer of Care (Signed)
Immediate Anesthesia Transfer of Care Note  Patient: Monica [name]  Procedure(s) Performed: INTRAMEDULLARY (IM) NAIL INTERTROCHANTRIC (Left )  Patient Location: PACU  Anesthesia Type:General  Level of Consciousness: awake, drowsy and patient cooperative  Airway & Oxygen Therapy: Patient Spontanous Breathing and Patient connected to face mask oxygen  Post-op Assessment: Report given to RN and Post -op Vital signs reviewed and stable  Post vital signs: Reviewed and stable  Last Vitals:  Vitals Value Taken Time  BP 118/77 10/03/20 1645  Temp    Pulse 79 10/03/20 1647  Resp 14 10/03/20 1648  SpO2 91 % 10/03/20 1647  Vitals shown include unvalidated device data.  Last Pain:  Vitals:   10/03/20 1058  TempSrc: Oral  PainSc: 4          Complications: No complications documented.

## 2020-10-03 NOTE — Anesthesia Preprocedure Evaluation (Addendum)
Anesthesia Evaluation  Patient identified by MRN, date of birth, ID band Patient awake    Reviewed: Allergy & Precautions, NPO status , Patient's Chart, lab work & pertinent test results, reviewed documented beta blocker date and time   Airway Mallampati: III       Dental   Pulmonary former smoker,    Pulmonary exam normal        Cardiovascular + CAD and + Past MI  Normal cardiovascular exam     Neuro/Psych negative neurological ROS  negative psych ROS   GI/Hepatic negative GI ROS, Neg liver ROS,   Endo/Other    Renal/GU negative Renal ROS  negative genitourinary   Musculoskeletal  (+) Arthritis ,   Abdominal Normal abdominal exam  (+)   Peds negative pediatric ROS (+)  Hematology negative hematology ROS (+)   Anesthesia Other Findings   Reproductive/Obstetrics                             Anesthesia Physical Anesthesia Plan  ASA: III  Anesthesia Plan: General   Post-op Pain Management:    Induction: Intravenous  PONV Risk Score and Plan:   Airway Management Planned: Oral ETT  Additional Equipment:   Intra-op Plan:   Post-operative Plan: Extubation in OR  Informed Consent: I have reviewed the patients History and Physical, chart, labs and discussed the procedure including the risks, benefits and alternatives for the proposed anesthesia with the patient or authorized representative who has indicated his/her understanding and acceptance.     Dental advisory given  Plan Discussed with: CRNA and Surgeon  Anesthesia Plan Comments: (Severe AS and cleared by cardiology as a high risk patient..  Will do a GOT.)       Anesthesia Quick Evaluation

## 2020-10-03 NOTE — Progress Notes (Signed)
Initial Nutrition Assessment  DOCUMENTATION CODES:   Obesity unspecified  INTERVENTION:  Provide Ensure Enlive po BID, each supplement provides 350 kcal and 20 grams of protein.  Provide MVI po daily.  NUTRITION DIAGNOSIS:   Increased nutrient needs related to post-op healing as evidenced by estimated needs.  GOAL:   Patient will meet greater than or equal to 90% of their needs  MONITOR:   Diet advancement,PO intake,Supplement acceptance,Labs,Weight trends,I & O's  REASON FOR ASSESSMENT:   Consult Assessment of nutrition requirement/status  ASSESSMENT:   87 year old female with PMHx of CAD, HLD, aortic stenosis admitted after fall with left intertrochanteric hip fracture.   Met with patient and her daughter at bedside this morning before being taken to the OR. Patient reports her appetite is good and she eats well. She typically eats 2-3 meals per day. For breakfast she may have eggs. For other meals she may have a sandwich with sides. Discussed increased nutrient needs for post-operative healing. Patient amenable to trying ONS to help meet increased needs.  Patient reports her UBW is 240 lbs and that she is weight-stable.  Medications reviewed and include: senna 1 tablet BID, LR at 50 mL/hr.  Labs reviewed.  Patient does not meet criteria for malnutrition at this time.  NUTRITION - FOCUSED PHYSICAL EXAM:  Flowsheet Row Most Recent Value  Orbital Region No depletion  Upper Arm Region No depletion  Thoracic and Lumbar Region No depletion  Buccal Region No depletion  Temple Region No depletion  Clavicle Bone Region No depletion  Clavicle and Acromion Bone Region No depletion  Scapular Bone Region No depletion  Dorsal Hand No depletion  Patellar Region No depletion  Anterior Thigh Region No depletion  Posterior Calf Region No depletion  Edema (RD Assessment) None  Hair Reviewed  Eyes Reviewed  Mouth Reviewed  Skin Reviewed  Nails Reviewed     Diet Order:    Diet Order            Diet NPO time specified  Diet effective now                EDUCATION NEEDS:   No education needs have been identified at this time  Skin:  Skin Assessment: Reviewed RN Assessment  Last BM:  10/01/2020 per chart  Height:   Ht Readings from Last 1 Encounters:  10/02/20 _0  (1.651 m)   Weight:   Wt Readings from Last 1 Encounters:  10/02/20 108.9 kg   BMI:  Body mass index is 39.94 kg/m.  Estimated Nutritional Needs:   Kcal:  1850-2050  Protein:  100-110 grams  Fluid:  1.8-2 L/day  Jacklynn Barnacle, MS, RD, LDN Pager number available on Amion

## 2020-10-04 ENCOUNTER — Encounter: Payer: Self-pay | Admitting: Orthopedic Surgery

## 2020-10-04 DIAGNOSIS — I350 Nonrheumatic aortic (valve) stenosis: Secondary | ICD-10-CM | POA: Diagnosis not present

## 2020-10-04 DIAGNOSIS — I2510 Atherosclerotic heart disease of native coronary artery without angina pectoris: Secondary | ICD-10-CM | POA: Diagnosis not present

## 2020-10-04 LAB — COMPREHENSIVE METABOLIC PANEL
ALT: 12 U/L (ref 0–44)
AST: 39 U/L (ref 15–41)
Albumin: 2.6 g/dL — ABNORMAL LOW (ref 3.5–5.0)
Alkaline Phosphatase: 46 U/L (ref 38–126)
Anion gap: 5 (ref 5–15)
BUN: 19 mg/dL (ref 8–23)
CO2: 25 mmol/L (ref 22–32)
Calcium: 7.9 mg/dL — ABNORMAL LOW (ref 8.9–10.3)
Chloride: 102 mmol/L (ref 98–111)
Creatinine, Ser: 0.91 mg/dL (ref 0.44–1.00)
GFR, Estimated: >60 mL/min (ref >60)
Glucose, Bld: 129 mg/dL — ABNORMAL HIGH (ref 70–99)
Potassium: 4.7 mmol/L (ref 3.5–5.1)
Sodium: 132 mmol/L — ABNORMAL LOW (ref 135–145)
Total Bilirubin: 1.5 mg/dL — ABNORMAL HIGH (ref 0.3–1.2)
Total Protein: 5.9 g/dL — ABNORMAL LOW (ref 6.5–8.1)

## 2020-10-04 LAB — CBC WITH DIFFERENTIAL/PLATELET
Abs Immature Granulocytes: 0.04 10*3/uL (ref 0.00–0.07)
Basophils Absolute: 0 10*3/uL (ref 0.0–0.1)
Basophils Relative: 0 %
Eosinophils Absolute: 0 10*3/uL (ref 0.0–0.5)
Eosinophils Relative: 0 %
HCT: 31 % — ABNORMAL LOW (ref 36.0–46.0)
Hemoglobin: 9.8 g/dL — ABNORMAL LOW (ref 12.0–15.0)
Immature Granulocytes: 0 %
Lymphocytes Relative: 8 %
Lymphs Abs: 1 10*3/uL (ref 0.7–4.0)
MCH: 28.1 pg (ref 26.0–34.0)
MCHC: 31.6 g/dL (ref 30.0–36.0)
MCV: 88.8 fL (ref 80.0–100.0)
Monocytes Absolute: 1 10*3/uL (ref 0.1–1.0)
Monocytes Relative: 8 %
Neutro Abs: 10.3 10*3/uL — ABNORMAL HIGH (ref 1.7–7.7)
Neutrophils Relative %: 84 %
Platelets: 145 10*3/uL — ABNORMAL LOW (ref 150–400)
RBC: 3.49 MIL/uL — ABNORMAL LOW (ref 3.87–5.11)
RDW: 14.3 % (ref 11.5–15.5)
WBC: 12.3 10*3/uL — ABNORMAL HIGH (ref 4.0–10.5)
nRBC: 0 % (ref 0.0–0.2)

## 2020-10-04 LAB — MAGNESIUM: Magnesium: 1.9 mg/dL (ref 1.7–2.4)

## 2020-10-04 LAB — PHOSPHORUS: Phosphorus: 3.2 mg/dL (ref 2.5–4.6)

## 2020-10-04 MED ORDER — ENOXAPARIN SODIUM 40 MG/0.4ML ~~LOC~~ SOLN: 40.0000 mg | SUBCUTANEOUS | 0 refills | Status: DC

## 2020-10-04 MED ORDER — BISACODYL 5 MG PO TBEC: 5.0000 mg | DELAYED_RELEASE_TABLET | Freq: Every day | ORAL | Status: DC | PRN

## 2020-10-04 MED ORDER — TRAMADOL HCL 50 MG PO TABS: 50.0000 mg | ORAL_TABLET | Freq: Four times a day (QID) | ORAL | 0 refills | Status: DC | PRN

## 2020-10-04 MED ORDER — POLYETHYLENE GLYCOL 3350 17 G PO PACK
17.0000 g | PACK | Freq: Every day | ORAL | Status: DC
  Administered 2020-10-04 – 2020-10-09 (×5): 17 g via ORAL
  Filled 2020-10-04 (×5): qty 1

## 2020-10-04 MED ORDER — OXYCODONE HCL 5 MG PO TABS: 2.5000 mg | ORAL_TABLET | ORAL | 0 refills | Status: DC | PRN

## 2020-10-04 MED ORDER — SENNOSIDES-DOCUSATE SODIUM 8.6-50 MG PO TABS
1.0000 | ORAL_TABLET | Freq: Two times a day (BID) | ORAL | Status: DC
  Administered 2020-10-04 – 2020-10-09 (×7): 1 via ORAL
  Filled 2020-10-04 (×8): qty 1

## 2020-10-04 NOTE — Progress Notes (Signed)
Physical Therapy Treatment Patient Details Name: Monica [name] MRN: [id number] DOB: Aug 17, 1933 Today's Date: 10/04/2020    History of Present Illness 87 y.o. female with a history of prior MI with stent placement in 2018, aortic stenosis, and GI bleed who had a fall.  Found to have L intertrochanteric hip fx with subsequent IM nailing ORIF 2/22.    PT Comments    Pt laying in bed, pleasant but more confused than this AM and not wanting to do a lot of mobility at this time.  PT was able to convince her to do some exercises in the bed.  She showed good effort but needed consistent cuing to stay on task and rest breaks after every few reps.  Pt did show good effort, but effort was punctuated with confused/tangential rambling at times; relatively easy to re-direct, but similarly easy to stray off task.   Follow Up Recommendations  SNF     Equipment Recommendations   (TBD at next venue)    Recommendations for Other Services       Precautions / Restrictions Precautions Precautions: Fall Restrictions Weight Bearing Restrictions: Yes LLE Weight Bearing: Touchdown weight bearing    Mobility  Bed Mobility Overal bed mobility: Needs Assistance Bed Mobility: Supine to Sit;Sit to Supine     Supine to sit: Mod assist;Max assist Sit to supine: Max assist   General bed mobility comments: Pt not wanting to perform mobility/OOB this session    Transfers Overall transfer level: Needs assistance Equipment used: Rolling walker (2 wheeled) Transfers: Sit to/from Stand Sit to Stand: Total assist         General transfer comment: pt not willing to attempt standing trials with OT. Did make good effort to come to sitting, but endorses fatique from earlier PT session. In addition, she is concerned about having enough energy for later session with PT this afternoon.  Ambulation/Gait             General Gait Details: unable/unsafe   Stairs             Wheelchair Mobility     Modified Rankin (Stroke Patients Only)       Balance Overall balance assessment: Needs assistance   Sitting balance-Leahy Scale: Fair Sitting balance - Comments: requiring UE support and extended time and assist to sqaure up hips for EOB sitting. Once hips squared, pt does demo better static sitting, but still appears to favor leaning to R. Almost appears to be in an attempt to weight shift pressure off of L post-op LE.     Standing balance-Leahy Scale: Zero Standing balance comment: pt not agreeable to attempting standing trial. tells this author several times that she's not supposed to put any weight through the L LE despite the orders in the computer stating "Toe Touch Weightbearing."                            Cognition Arousal/Alertness: Awake/alert Behavior During Therapy: Restless Overall Cognitive Status: Within Functional Limits for tasks assessed                                 General Comments: Pt with inconsistent confusion t/o the session, needed repeat cues, etc essentially the entire time      Exercises General Exercises - Lower Extremity Ankle Circles/Pumps: AROM;10 reps Quad Sets: Strengthening;10 reps Short Arc Quad: AAROM;AROM;10 reps Heel Slides: AAROM;10  reps Hip ABduction/ADduction: AROM;AAROM;10 reps Other Exercises Other Exercises: OT facilitates ed re: importance of regular OOB activity, role of OT in acute setting especially given ortho floor & s/p hip sx. Pt with moderate/good reception. OT educates pt re: potential need for rehabilitation and potential need for AE training d/t limited L hip ROM and tolerance. Pt with moderate reception.    General Comments        Pertinent Vitals/Pain Pain Assessment: 0-10 Pain Score: 6  Pain Location: L hip Pain Descriptors / Indicators: Operative site guarding;Tender Pain Intervention(s): Limited activity within patient's tolerance;Monitored during session;RN gave pain meds during  session    Home Living Family/patient expects to be discharged to:: Skilled nursing facility               Additional Comments: Pt's dtr lives with her in [facility] and she reports that her grandson is 70 and visits her every single day.    Prior Function Level of Independence: Independent with assistive device(s)      Comments: Prior to fall pt was able to be up ad lib with cane or walker, could go out with family to run errands, and performed [facility] IADLs in addition to her self care BADLs.   PT Goals (current goals can now be found in the care plan section) Acute Rehab PT Goals Patient Stated Goal: do as much as I can for myself and ideally to go home with my daughter's help PT Goal Formulation: With patient Time For Goal Achievement: 10/18/20 Potential to Achieve Goals: Fair    Frequency    BID      PT Plan Current plan remains appropriate    Co-evaluation              AM-PAC PT "6 Clicks" Mobility   Outcome Measure  Help needed turning from your back to your side while in a flat bed without using bedrails?: A Lot Help needed moving from lying on your back to sitting on the side of a flat bed without using bedrails?: A Lot Help needed moving to and from a bed to a chair (including a wheelchair)?: Total Help needed standing up from a chair using your arms (e.g., wheelchair or bedside chair)?: Total Help needed to walk in hospital room?: Total Help needed climbing 3-5 steps with a railing? : Total 6 Click Score: 8    End of Session Equipment Utilized During Treatment: Gait belt Activity Tolerance: Patient limited by pain;Patient limited by fatigue Patient left: with bed alarm set;with call bell/phone within reach;with family/visitor present Nurse Communication: Mobility status PT Visit Diagnosis: Muscle weakness (generalized) (M62.81);Difficulty in walking, not elsewhere classified (R26.2);Pain Pain - Right/Left: Left Pain - part of body: Hip     Time:  7628-3151 PT Time Calculation (min) (ACUTE ONLY): 24 min  Charges:  $Therapeutic Exercise: 23-37 mins $Therapeutic Activity: 8-22 mins                     Malachi Pro, DPT 10/04/2020, 4:27 PM

## 2020-10-04 NOTE — Progress Notes (Signed)
Progress Note  Patient Name: Monica [name] Date of Encounter: 10/04/2020  [facility] Cardiologist: Lorine Bears, MD   Subjective   Still has some leg pain, just took a pain medication.  Planning on working with physical therapy later.  Breathing is okay.  Inpatient Medications    Scheduled Meds: . acetaminophen  1,000 mg Oral Q8H  . aspirin EC  81 mg Oral Daily  . Chlorhexidine Gluconate Cloth  6 each Topical Daily  . docusate sodium  100 mg Oral BID  . enoxaparin (LOVENOX) injection  40 mg Subcutaneous Q24H  . feeding supplement  237 mL Oral BID BM  . ketorolac  7.5 mg Intravenous Q6H  . multivitamin with minerals  1 tablet Oral Daily  . rosuvastatin  10 mg Oral Daily   Continuous Infusions: . sodium chloride 75 mL/hr at 10/04/20 0430  . methocarbamol (ROBAXIN) IV     PRN Meds: bisacodyl, HYDROmorphone (DILAUDID) injection, methocarbamol **OR** methocarbamol (ROBAXIN) IV, metoCLOPramide **OR** metoCLOPramide (REGLAN) injection, ondansetron **OR** ondansetron (ZOFRAN) IV, oxyCODONE, oxyCODONE, senna-docusate, sodium phosphate, traMADol   Vital Signs    Vitals:   10/03/20 2328 10/04/20 0223 10/04/20 0443 10/04/20 1122  BP: 97/60 107/63 100/63 (!) 102/57  Pulse: 83 82 87 88  Resp: Temp: 97.8 F (36.6 C)  97.8 F (36.6 C) 97.8 F (36.6 C)  TempSrc:   Oral Oral  SpO2: 96%  100% 95%  Weight:      Height:        Intake/Output Summary (Last 24 hours) at 10/04/2020 1130 Last data filed at 10/04/2020 4098 Gross per 24 hour  Intake 4217.66 ml  Output 1080 ml  Net 3137.66 ml   Last 3 Weights 10/02/2020 05/16/2020 11/16/2019  Weight (lbs) 240 lb 256 lb 240 lb  Weight (kg) 108.863 kg 116.121 kg 108.863 kg      Telemetry    Sinus rhythm- Personally Reviewed  ECG    No new tracing- Personally Reviewed  Physical Exam   GEN: No acute distress.   Neck: No JVD Cardiac: RRR, 3/6 systolic murmur, RUSB.  Respiratory:  Diminished breath sounds at  bases GI: Soft, nontender, non-distended  MS:  Trace edema; No deformity. Neuro:  Nonfocal  Psych: Normal affect   Labs    High Sensitivity Troponin:   Recent Labs  Lab 10/02/20 0907 10/02/20 1153  TROPONINIHS 14 12      Chemistry Recent Labs  Lab 10/02/20 0907 10/04/20 0423  NA 136 132*  K 4.2 4.7  CL 104 102  CO2 26 25  GLUCOSE 118* 129*  BUN 19 19  CREATININE 0.87 0.91  CALCIUM 8.7* 7.9*  PROT  --  5.9*  ALBUMIN  --  2.6*  AST  --  39  ALT  --  12  ALKPHOS  --  46  BILITOT  --  1.5*  GFRNONAA >60 >60  ANIONGAP 6 5     Hematology Recent Labs  Lab 10/02/20 0907 10/04/20 0423  WBC 10.9* 12.3*  RBC 5.03 3.49*  HGB 13.9 9.8*  HCT 43.9 31.0*  MCV 87.3 88.8  MCH 27.6 28.1  MCHC 31.7 31.6  RDW 14.6 14.3  PLT 196 145*    BNP Recent Labs  Lab 10/02/20 0907  BNP 178.2*     DDimer No results for input(s): DDIMER in the last 168 hours.   Radiology    ECHOCARDIOGRAM COMPLETE  Result Date: 10/03/2020    ECHOCARDIOGRAM REPORT   Patient Name:  Monica [name] Date of Exam: 10/03/2020 Medical Rec #:  [id number]      Height:       65.0 in Accession #:    [id number]     Weight:       240.0 lb Date of Birth:  03/29/1934      BSA:          2.138 m Patient Age:    86 years       BP:           95/71 mmHg Patient Gender: F              HR:           86 bpm. Exam Location:  [facility] Procedure: 2D Echo, Color Doppler, Cardiac Doppler and Intracardiac            Opacification Agent Indications:     I35.0 Aortic stenosis  History:         Patient has prior history of Echocardiogram examinations, most                  recent 10/26/2019. CAD, Signs/Symptoms:Broken hip; Risk                  Factors:Dyslipidemia.  Sonographer:     Humphrey Rolls RDCS (AE) Referring Phys:  [id number] Sondra Barges Diagnosing Phys: Yvonne Kendall MD  Sonographer Comments: Suboptimal apical window. Image acquisition challenging due to patient body habitus. IMPRESSIONS  1. Left ventricular ejection fraction, by  estimation, is 60 to 65%. The left ventricle has normal function. Left ventricular endocardial border not optimally defined to evaluate regional wall motion. There is mild left ventricular hypertrophy. Left ventricular diastolic parameters are consistent with Grade I diastolic dysfunction (impaired relaxation). Elevated left atrial pressure.  2. Right ventricular systolic function is normal. The right ventricular size is mildly enlarged. Tricuspid regurgitation signal is inadequate for assessing PA pressure.  3. Left atrial size was mildly dilated.  4. Right atrial size was mildly dilated.  5. The mitral valve is degenerative. Trivial mitral valve regurgitation. Mild mitral stenosis. Moderate mitral annular calcification.  6. Tricuspid valve regurgitation at least moderate but suboptimally visualized.  7. The aortic valve is tricuspid. There is moderate calcification of the aortic valve. There is severe thickening of the aortic valve. Aortic valve regurgitation is not visualized. Severe aortic valve stenosis. Aortic valve area, by VTI measures 0.51 cm. Aortic valve mean gradient measures 60.0 mmHg. Aortic valve Vmax measures 4.83 m/s.  8. The inferior vena cava is normal in size with greater than 50% respiratory variability, suggesting right atrial pressure of 3 mmHg. Comparison(s): Very severe aortic stenosis, worsened compared with prior echocardiogram from 10/26/2019. FINDINGS  Left Ventricle: Left ventricular ejection fraction, by estimation, is 60 to 65%. The left ventricle has normal function. Left ventricular endocardial border not optimally defined to evaluate regional wall motion. Definity contrast agent was given IV to delineate the left ventricular endocardial borders. The left ventricular internal cavity size was normal in size. There is mild left ventricular hypertrophy. Left ventricular diastolic parameters are consistent with Grade I diastolic dysfunction (impaired relaxation). Elevated left atrial  pressure. Right Ventricle: The right ventricular size is mildly enlarged. No increase in right ventricular wall thickness. Right ventricular systolic function is normal. Tricuspid regurgitation signal is inadequate for assessing PA pressure. Left Atrium: Left atrial size was mildly dilated. Right Atrium: Right atrial size was mildly dilated. Pericardium: There is no evidence of pericardial effusion. Mitral Valve: The  mitral valve is degenerative in appearance. There is mild thickening of the mitral valve leaflet(s). Moderate mitral annular calcification. Trivial mitral valve regurgitation. Mild mitral valve stenosis. MV peak gradient, 11.2 mmHg. The  mean mitral valve gradient is 5.0 mmHg. Tricuspid Valve: The tricuspid valve is not well visualized. Tricuspid valve regurgitation at least moderate but suboptimally visualized. Aortic Valve: The aortic valve is tricuspid. There is moderate calcification of the aortic valve. There is severe thickening of the aortic valve. Aortic valve regurgitation is not visualized. Severe aortic stenosis is present. Aortic valve mean gradient measures 60.0 mmHg. Aortic valve peak gradient measures 93.3 mmHg. Aortic valve area, by VTI measures 0.51 cm. Pulmonic Valve: The pulmonic valve was not well visualized. Pulmonic valve regurgitation is not visualized. No evidence of pulmonic stenosis. Aorta: The aortic root is normal in size and structure. Pulmonary Artery: The pulmonary artery is not well seen. Venous: The inferior vena cava is normal in size with greater than 50% respiratory variability, suggesting right atrial pressure of 3 mmHg. IAS/Shunts: The interatrial septum was not well visualized.  LEFT VENTRICLE PLAX 2D LVIDd:         4.00 cm  Diastology LVIDs:         2.50 cm  LV e' medial:    8.38 cm/s LV PW:         1.36 cm  LV E/e' medial:  13.8 LV IVS:        1.09 cm  LV e' lateral:   5.44 cm/s LVOT diam:     1.90 cm  LV E/e' lateral: 21.3 LV SV:         51 LV SV Index:   24  LVOT Area:     2.84 cm  RIGHT VENTRICLE RV Basal diam:  4.60 cm LEFT ATRIUM           Index       RIGHT ATRIUM           Index LA diam:      3.90 cm 1.82 cm/m  RA Area:     23.00 cm LA Vol (A4C): 69.3 ml 32.42 ml/m RA Volume:   76.20 ml  35.65 ml/m  AORTIC VALVE                    PULMONIC VALVE AV Area (Vmax):    0.53 cm     PV Vmax:       1.23 m/s AV Area (Vmean):   0.53 cm     PV Vmean:      80.200 cm/s AV Area (VTI):     0.51 cm     PV VTI:        0.229 m AV Vmax:           483.00 cm/s  PV Peak grad:  6.1 mmHg AV Vmean:          346.000 cm/s PV Mean grad:  3.0 mmHg AV VTI:            1.007 m AV Peak Grad:      93.3 mmHg AV Mean Grad:      60.0 mmHg LVOT Vmax:         89.50 cm/s LVOT Vmean:        64.100 cm/s LVOT VTI:          0.181 m LVOT/AV VTI ratio: 0.18  AORTA Ao Root diam: 3.40 cm MITRAL VALVE MV Area (PHT): 7.29 cm     SHUNTS MV  Area VTI:   1.93 cm     Systemic VTI:  0.18 m MV Peak grad:  11.2 mmHg    Systemic Diam: 1.90 cm MV Mean grad:  5.0 mmHg MV Vmax:       1.67 m/s MV Vmean:      102.0 cm/s MV Decel Time: 104 msec MV E velocity: 116.00 cm/s MV A velocity: 164.00 cm/s MV E/A ratio:  0.71 Cristal Deer End MD Electronically signed by Yvonne Kendall MD Signature Date/Time: 10/03/2020/9:54:38 AM    Final    DG HIP OPERATIVE UNILAT W OR W/O PELVIS LEFT  Result Date: 10/03/2020 CLINICAL DATA:  Intramedullary nail on the LEFT. EXAM: OPERATIVE LEFT HIP (WITH PELVIS IF PERFORMED)  VIEWS TECHNIQUE: Fluoroscopic spot image(s) were submitted for interpretation post-operatively. COMPARISON:  October 02, 2020 FINDINGS: Six intraoperative fluoroscopic views with changes of intramedullary rod placement and screw fixation across the femoral neck spanning a comminuted intertrochanteric fracture of the LEFT proximal femur. Midportion of the device is not imaged. Distal interlocking screws of been placed in the distal femur within the metadiaphyseal region. Proximally there is a compression type screw in a  cannulated lag screw in place across the femoral neck. Persistent distraction of the lesser trochanteric fragment. IMPRESSION: Post ORIF of LEFT intertrochanteric fracture with intramedullary nailing and fixation devices as described. Midportion of the devices are not imaged.  Attention on follow-up. Electronically Signed   By: Donzetta Kohut M.D.   On: 10/03/2020 22:21    Cardiac Studies   Echocardiogram 10/03/2020 1. Left ventricular ejection fraction, by estimation, is 60 to 65%. The  left ventricle has normal function. Left ventricular endocardial border  not optimally defined to evaluate regional wall motion. There is mild left  ventricular hypertrophy. Left  ventricular diastolic parameters are consistent with Grade I diastolic  dysfunction (impaired relaxation). Elevated left atrial pressure.  2. Right ventricular systolic function is normal. The right ventricular  size is mildly enlarged. Tricuspid regurgitation signal is inadequate for  assessing PA pressure.  3. Left atrial size was mildly dilated.  4. Right atrial size was mildly dilated.  5. The mitral valve is degenerative. Trivial mitral valve regurgitation.  Mild mitral stenosis. Moderate mitral annular calcification.  6. Tricuspid valve regurgitation at least moderate but suboptimally  visualized.  7. The aortic valve is tricuspid. There is moderate calcification of the  aortic valve. There is severe thickening of the aortic valve. Aortic valve  regurgitation is not visualized. Severe aortic valve stenosis. Aortic  valve area, by VTI measures 0.51  cm. Aortic valve mean gradient measures 60.0 mmHg. Aortic valve Vmax  measures 4.83 m/s.  8. The inferior vena cava is normal in size with greater than 50%  respiratory variability, suggesting right atrial pressure of 3 mmHg.   Patient Profile     87 y.o. female history of severe aortic stenosis presenting with a fall, found to have left hip fracture status post  surgical repair.  Assessment & Plan    1.  Severe AS -BP low normal.  Stop carvedilol -Follow-up outpatient for TAVR work-up.  2.  CAD/inferior MI status post PCI. -EF preserved -Aspirin, statin  3.  Left hip fracture s/p repair -Management per surgical team  No additional input from a cardiac perspective.  Continue management/rehab as per surgical/primary team.  Follow-up outpatient for TAVR planning to address severe aortic valve stenosis.  Total encounter time 35 minutes  Greater than 50% was spent in counseling and coordination of care with the patient  Signed, Debbe Odea, MD  10/04/2020, 11:30 AM

## 2020-10-04 NOTE — Hospital Course (Addendum)
87 y.o. WF PMHx aortic stenosis, CAD, dyslipidemia, obesity who presented to the ED on 10/03/20 via EMS after a mechanical fall at home resulting in a LEFT proximal femur fracture.  Patient reported the fall occurred when she lost her balance walking around her bed to turn up the thermostat, falling into a dresser and landing on her left side.    Admitted to the hospitalist service with orthopedics consulted.  Patient underwent surgery on afternoon of 2/22, with intramedullary nailing of the LEFT femur.  Patient evaluated by PT and OT postoperatively, and SNF for short-term rehab was recommended after discharge.  TOC is following and placement is underway.  Admission was prolonged due to persistent hypoxia.  Patient does not typically require supplemental oxygen, has need ~4 l/min since surgery.  Likely multifactorial due to AS, pulmonary hypertension, underlying COPD/emphysema on top of physical stress of hip fracture and surgery.  Patient was evaluated by cardiology and pulmonology.  She will require oxygen on discharge, currently at 3 L/min.    Outpatient evaluation with Dr. Kirke Corin planned after short term rehab and recovery from hip fracture.

## 2020-10-04 NOTE — Progress Notes (Addendum)
PROGRESS NOTE    Monica [name]   EAV:[id number]  DOB: 19-Oct-1933  PCP: Patient, No Pcp Per    DOA: 10/02/2020 LOS: 2   Brief Narrative   87 y.o. WF PMHx aortic stenosis, CAD, dyslipidemia, obesity who presented to the ED on 10/03/20 via EMS after a mechanical fall at home resulting in a LEFT proximal femur fracture.  Patient reported the fall occurred when she lost her balance walking around her bed to turn up the thermostat, falling into a dresser and landing on her left side.    Admitted to the hospitalist service with orthopedics consulted.  Patient underwent surgery on afternoon of 2/22, with intramedullary nailing of the LEFT femur.    Assessment & Plan   Principal Problem:   Closed comminuted intertrochanteric fracture of proximal end of left femur (HCC) Active Problems:   Aortic stenosis   Obesity   CAD (coronary artery disease)   Severe aortic stenosis   Left hip fracture -status post surgical repair on 2/22.  --PT and OT evaluations  --TOC consulted for SNF placement --Lovenox, foot pumps, TED hose for DVT prophylaxis --Toe-touch weightbearing on the left lower extremity --Follow-up in [facility] 2 weeks for staple removal and x-ray --Analgesics as needed --Bowel regimen  Acute respiratory failure with hypoxia - has been requiring supplemental oxygen of 4-6 L/min in postop setting.    --Titrate O2 for sats > 88%, wean as tolerated --Further evaluation if not weaned in next 24 hrs  Severe aortic stenosis -cardiology consulted.  Blood pressures low/normal. --Coreg stopped --Outpatient follow-up recommended for TAVR evaluation  Coronary artery disease / History of inferior MI status post PCI -stable.  EF preserved. --Continue aspirin and statin --Beta-blocker has been stopped as above   Obesity: Body mass index is 39.94 kg/m.  Complicates overall care and prognosis.  DVT prophylaxis: enoxaparin (LOVENOX) injection 40 mg Start: 10/04/20 0800 SCDs  Start: 10/03/20 1908   Diet:  Diet Orders (From admission, onward)    Start     Ordered   10/03/20 1908  Diet regular Room service appropriate? Yes; Fluid consistency: Thin  Diet effective now       Question Answer Comment  Room service appropriate? Yes   Fluid consistency: Thin      10/03/20 1907            Code Status: Full Code    Subjective 10/04/20    Patient seen at bedside this morning.  Daughter was present.  She currently is not having pain.  Was about 6 out of 10 in severity prior to medication given this morning.  She denies any cough or shortness of breath, fevers or chills.  Does not use any oxygen at home, currently on 4 L/min.   Disposition Plan & Communication   Status is: Inpatient  Remains inpatient appropriate because:Inpatient level of care appropriate due to severity of illness.  Patient continues require supplemental oxygen postoperatively, evaluation of this is underway.  Weaning of oxygen   Dispo: The patient is from: Home              Anticipated d/c is to: SNF              Anticipated d/c date is: 1 day              Patient currently is not medically stable to d/c.   Difficult to place patient No  Family Communication: Daughter at bedside on rounds today   Consults, Procedures, Significant Events  Consultants:   Orthopedics  Cardiology  Procedures:   IM nail fixation of left intertrochanteric femur fracture 2/22  Antimicrobials:  Anti-infectives (From admission, onward)   Start     Dose/Rate Route Frequency Ordered Stop   10/03/20 2000  ceFAZolin (ANCEF) IVPB 2g/100 mL premix        2 g 200 mL/hr over 30 Minutes Intravenous Every 6 hours 10/03/20 1907 10/04/20 1124        Micro    Objective   Vitals:   10/03/20 2328 10/04/20 0223 10/04/20 0443 10/04/20 1122  BP: 97/60 107/63 100/63 (!) 102/57  Pulse: 83 82 87 88  Resp: 17  16 16   Temp: 97.8 F (36.6 C)  97.8 F (36.6 C) 97.8 F (36.6 C)  TempSrc:   Oral Oral   SpO2: 96%  100% 95%  Weight:      Height:        Intake/Output Summary (Last 24 hours) at 10/04/2020 1341 Last data filed at 10/04/2020 [id number] Gross per 24 hour  Intake 4167.66 ml  Output 1080 ml  Net 3087.66 ml   Filed Weights   10/02/20 0859  Weight: 108.9 kg    Physical Exam:  General exam: awake, alert, no acute distress, obese HEENT: moist mucus membranes, hearing grossly normal  Respiratory system: CTAB, no wheezes, rales or rhonchi, normal respiratory effort. Cardiovascular system: normal S1/S2, RRR, no pedal edema.   Gastrointestinal system: soft, NT, ND, +bowel sounds. Central nervous system: A&O x3. no gross focal neurologic deficits, normal speech Extremities: moves all, no edema, normal tone Skin: dry, intact, normal temperature, normal color Psychiatry: normal mood, congruent affect, judgement and insight appear normal  Labs   Data Reviewed: I have personally reviewed following labs and imaging studies  CBC: Recent Labs  Lab 10/02/20 0907 10/04/20 0423  WBC 10.9* 12.3*  NEUTROABS 8.1* 10.3*  HGB 13.9 9.8*  HCT 43.9 31.0*  MCV 87.3 88.8  PLT 196 145*   Basic Metabolic Panel: Recent Labs  Lab 10/02/20 0907 10/04/20 0423  NA 136 132*  K 4.2 4.7  CL 104 102  CO2 26 25  GLUCOSE 118* 129*  BUN 19 19  CREATININE 0.87 0.91  CALCIUM 8.7* 7.9*  MG  --  1.9  PHOS  --  3.2   GFR: Estimated Creatinine Clearance: 56.5 mL/min (by C-G formula based on SCr of 0.91 mg/dL). Liver Function Tests: Recent Labs  Lab 10/04/20 0423  AST 39  ALT 12  ALKPHOS 46  BILITOT 1.5*  PROT 5.9*  ALBUMIN 2.6*   No results for input(s): LIPASE, AMYLASE in the last 168 hours. No results for input(s): AMMONIA in the last 168 hours. Coagulation Profile: Recent Labs  Lab 10/02/20 1153  INR 1.1   Cardiac Enzymes: No results for input(s): CKTOTAL, CKMB, CKMBINDEX, TROPONINI in the last 168 hours. BNP (last 3 results) No results for input(s): PROBNP in the last 8760  hours. HbA1C: No results for input(s): HGBA1C in the last 72 hours. CBG: No results for input(s): GLUCAP in the last 168 hours. Lipid Profile: No results for input(s): CHOL, HDL, LDLCALC, TRIG, CHOLHDL, LDLDIRECT in the last 72 hours. Thyroid Function Tests: No results for input(s): TSH, T4TOTAL, FREET4, T3FREE, THYROIDAB in the last 72 hours. Anemia Panel: No results for input(s): VITAMINB12, FOLATE, FERRITIN, TIBC, IRON, RETICCTPCT in the last 72 hours. Sepsis Labs: No results for input(s): PROCALCITON, LATICACIDVEN in the last 168 hours.  Recent Results (from the past 240 hour(s))  Resp Panel by  RT-PCR (Flu A&B, Covid) Nasopharyngeal Swab     Status: None   Collection Time: 10/02/20  9:07 AM   Specimen: Nasopharyngeal Swab; Nasopharyngeal(NP) swabs in vial transport medium  Result Value Ref Range Status   SARS Coronavirus 2 by RT PCR NEGATIVE NEGATIVE Final    Comment: (NOTE) SARS-CoV-2 target nucleic acids are NOT DETECTED.  The SARS-CoV-2 RNA is generally detectable in upper respiratory specimens during the acute phase of infection. The lowest concentration of SARS-CoV-2 viral copies this assay can detect is 138 copies/mL. A negative result does not preclude SARS-Cov-2 infection and should not be used as the sole basis for treatment or other patient management decisions. A negative result may occur with  improper specimen collection/handling, submission of specimen other than nasopharyngeal swab, presence of viral mutation(s) within the areas targeted by this assay, and inadequate number of viral copies(<138 copies/mL). A negative result must be combined with clinical observations, patient history, and epidemiological information. The expected result is Negative.  Fact Sheet for Patients:  BloggerCourse.com  Fact Sheet for Healthcare Providers:  SeriousBroker.it  This test is no t yet approved or cleared by the [place] FDA and  has been authorized for detection and/or diagnosis of SARS-CoV-2 by FDA under an Emergency Use Authorization (EUA). This EUA will remain  in effect (meaning this test can be used) for the duration of the COVID-19 declaration under Section 564(b)(1) of the Act, 21 U.S.C.section 360bbb-3(b)(1), unless the authorization is terminated  or revoked sooner.       Influenza A by PCR NEGATIVE NEGATIVE Final   Influenza B by PCR NEGATIVE NEGATIVE Final    Comment: (NOTE) The Xpert Xpress SARS-CoV-2/FLU/RSV plus assay is intended as an aid in the diagnosis of influenza from Nasopharyngeal swab specimens and should not be used as a sole basis for treatment. Nasal washings and aspirates are unacceptable for Xpert Xpress SARS-CoV-2/FLU/RSV testing.  Fact Sheet for Patients: BloggerCourse.com  Fact Sheet for Healthcare Providers: SeriousBroker.it  This test is not yet approved or cleared by the [place] FDA and has been authorized for detection and/or diagnosis of SARS-CoV-2 by FDA under an Emergency Use Authorization (EUA). This EUA will remain in effect (meaning this test can be used) for the duration of the COVID-19 declaration under Section 564(b)(1) of the Act, 21 U.S.C. section 360bbb-3(b)(1), unless the authorization is terminated or revoked.  Performed at [facility], [address] [place], [place] [address]       Imaging Studies   ECHOCARDIOGRAM COMPLETE  Result Date: 10/03/2020    ECHOCARDIOGRAM REPORT   Patient Name:   Monica [name] Date of Exam: 10/03/2020 Medical Rec #:  [id number]      Height:       65.0 in Accession #:    [id number]     Weight:       240.0 lb Date of Birth:  1934/08/11      BSA:          2.138 m Patient Age:    86 years       BP:           95/71 mmHg Patient Gender: F              HR:           86 bpm. Exam Location:  [facility] Procedure: 2D Echo, Color Doppler, Cardiac Doppler and  Intracardiac            Opacification Agent Indications:  I35.0 Aortic stenosis  History:         Patient has prior history of Echocardiogram examinations, most                  recent 10/26/2019. CAD, Signs/Symptoms:Broken hip; Risk                  Factors:Dyslipidemia.  Sonographer:     Humphrey Rolls RDCS (AE) Referring Phys:  [id number] Sondra Barges Diagnosing Phys: Yvonne Kendall MD  Sonographer Comments: Suboptimal apical window. Image acquisition challenging due to patient body habitus. IMPRESSIONS  1. Left ventricular ejection fraction, by estimation, is 60 to 65%. The left ventricle has normal function. Left ventricular endocardial border not optimally defined to evaluate regional wall motion. There is mild left ventricular hypertrophy. Left ventricular diastolic parameters are consistent with Grade I diastolic dysfunction (impaired relaxation). Elevated left atrial pressure.  2. Right ventricular systolic function is normal. The right ventricular size is mildly enlarged. Tricuspid regurgitation signal is inadequate for assessing PA pressure.  3. Left atrial size was mildly dilated.  4. Right atrial size was mildly dilated.  5. The mitral valve is degenerative. Trivial mitral valve regurgitation. Mild mitral stenosis. Moderate mitral annular calcification.  6. Tricuspid valve regurgitation at least moderate but suboptimally visualized.  7. The aortic valve is tricuspid. There is moderate calcification of the aortic valve. There is severe thickening of the aortic valve. Aortic valve regurgitation is not visualized. Severe aortic valve stenosis. Aortic valve area, by VTI measures 0.51 cm. Aortic valve mean gradient measures 60.0 mmHg. Aortic valve Vmax measures 4.83 m/s.  8. The inferior vena cava is normal in size with greater than 50% respiratory variability, suggesting right atrial pressure of 3 mmHg. Comparison(s): Very severe aortic stenosis, worsened compared with prior echocardiogram from 10/26/2019.  FINDINGS  Left Ventricle: Left ventricular ejection fraction, by estimation, is 60 to 65%. The left ventricle has normal function. Left ventricular endocardial border not optimally defined to evaluate regional wall motion. Definity contrast agent was given IV to delineate the left ventricular endocardial borders. The left ventricular internal cavity size was normal in size. There is mild left ventricular hypertrophy. Left ventricular diastolic parameters are consistent with Grade I diastolic dysfunction (impaired relaxation). Elevated left atrial pressure. Right Ventricle: The right ventricular size is mildly enlarged. No increase in right ventricular wall thickness. Right ventricular systolic function is normal. Tricuspid regurgitation signal is inadequate for assessing PA pressure. Left Atrium: Left atrial size was mildly dilated. Right Atrium: Right atrial size was mildly dilated. Pericardium: There is no evidence of pericardial effusion. Mitral Valve: The mitral valve is degenerative in appearance. There is mild thickening of the mitral valve leaflet(s). Moderate mitral annular calcification. Trivial mitral valve regurgitation. Mild mitral valve stenosis. MV peak gradient, 11.2 mmHg. The  mean mitral valve gradient is 5.0 mmHg. Tricuspid Valve: The tricuspid valve is not well visualized. Tricuspid valve regurgitation at least moderate but suboptimally visualized. Aortic Valve: The aortic valve is tricuspid. There is moderate calcification of the aortic valve. There is severe thickening of the aortic valve. Aortic valve regurgitation is not visualized. Severe aortic stenosis is present. Aortic valve mean gradient measures 60.0 mmHg. Aortic valve peak gradient measures 93.3 mmHg. Aortic valve area, by VTI measures 0.51 cm. Pulmonic Valve: The pulmonic valve was not well visualized. Pulmonic valve regurgitation is not visualized. No evidence of pulmonic stenosis. Aorta: The aortic root is normal in size and  structure. Pulmonary Artery: The pulmonary artery is not  well seen. Venous: The inferior vena cava is normal in size with greater than 50% respiratory variability, suggesting right atrial pressure of 3 mmHg. IAS/Shunts: The interatrial septum was not well visualized.  LEFT VENTRICLE PLAX 2D LVIDd:         4.00 cm  Diastology LVIDs:         2.50 cm  LV e' medial:    8.38 cm/s LV PW:         1.36 cm  LV E/e' medial:  13.8 LV IVS:        1.09 cm  LV e' lateral:   5.44 cm/s LVOT diam:     1.90 cm  LV E/e' lateral: 21.3 LV SV:         51 LV SV Index:   24 LVOT Area:     2.84 cm  RIGHT VENTRICLE RV Basal diam:  4.60 cm LEFT ATRIUM           Index       RIGHT ATRIUM           Index LA diam:      3.90 cm 1.82 cm/m  RA Area:     23.00 cm LA Vol (A4C): 69.3 ml 32.42 ml/m RA Volume:   76.20 ml  35.65 ml/m  AORTIC VALVE                    PULMONIC VALVE AV Area (Vmax):    0.53 cm     PV Vmax:       1.23 m/s AV Area (Vmean):   0.53 cm     PV Vmean:      80.200 cm/s AV Area (VTI):     0.51 cm     PV VTI:        0.229 m AV Vmax:           483.00 cm/s  PV Peak grad:  6.1 mmHg AV Vmean:          346.000 cm/s PV Mean grad:  3.0 mmHg AV VTI:            1.007 m AV Peak Grad:      93.3 mmHg AV Mean Grad:      60.0 mmHg LVOT Vmax:         89.50 cm/s LVOT Vmean:        64.100 cm/s LVOT VTI:          0.181 m LVOT/AV VTI ratio: 0.18  AORTA Ao Root diam: 3.40 cm MITRAL VALVE MV Area (PHT): 7.29 cm     SHUNTS MV Area VTI:   1.93 cm     Systemic VTI:  0.18 m MV Peak grad:  11.2 mmHg    Systemic Diam: 1.90 cm MV Mean grad:  5.0 mmHg MV Vmax:       1.67 m/s MV Vmean:      102.0 cm/s MV Decel Time: 104 msec MV E velocity: 116.00 cm/s MV A velocity: 164.00 cm/s MV E/A ratio:  0.71 Cristal Deer End MD Electronically signed by Yvonne Kendall MD Signature Date/Time: 10/03/2020/9:54:38 AM    Final    DG HIP OPERATIVE UNILAT W OR W/O PELVIS LEFT  Result Date: 10/03/2020 CLINICAL DATA:  Intramedullary nail on the LEFT. EXAM: OPERATIVE LEFT  HIP (WITH PELVIS IF PERFORMED)  VIEWS TECHNIQUE: Fluoroscopic spot image(s) were submitted for interpretation post-operatively. COMPARISON:  October 02, 2020 FINDINGS: Six intraoperative fluoroscopic views with changes of intramedullary rod placement and screw fixation  across the femoral neck spanning a comminuted intertrochanteric fracture of the LEFT proximal femur. Midportion of the device is not imaged. Distal interlocking screws of been placed in the distal femur within the metadiaphyseal region. Proximally there is a compression type screw in a cannulated lag screw in place across the femoral neck. Persistent distraction of the lesser trochanteric fragment. IMPRESSION: Post ORIF of LEFT intertrochanteric fracture with intramedullary nailing and fixation devices as described. Midportion of the devices are not imaged.  Attention on follow-up. Electronically Signed   By: Donzetta Kohut M.D.   On: 10/03/2020 22:21     Medications   Scheduled Meds: . acetaminophen  1,000 mg Oral Q8H  . aspirin EC  81 mg Oral Daily  . Chlorhexidine Gluconate Cloth  6 each Topical Daily  . docusate sodium  100 mg Oral BID  . enoxaparin (LOVENOX) injection  40 mg Subcutaneous Q24H  . feeding supplement  237 mL Oral BID BM  . multivitamin with minerals  1 tablet Oral Daily  . rosuvastatin  10 mg Oral Daily   Continuous Infusions: . sodium chloride 75 mL/hr at 10/04/20 0430  . methocarbamol (ROBAXIN) IV         LOS: 2 days    Time spent: 30 minutes    Pennie Banter, DO [facility]  10/04/2020, 1:41 PM      If 7PM-7AM, please contact night-coverage. How to contact the [facility] Attending or Consulting provider 7A - 7P or covering provider during after hours 7P -7A, for this patient?    1. Check the care team in [facility] and look for a) attending/consulting [facility] provider listed and b) the [facility] team listed 2. Log into [email or web address] and use [facility] universal password to access. If you do not have  the password, please contact the hospital operator. 3. Locate the [facility] provider you are looking for under [facility] and page to a number that you can be directly reached. 4. If you still have difficulty reaching the provider, please page the [facility] (Director on Call) for the Hospitalists listed on amion for assistance.

## 2020-10-04 NOTE — Evaluation (Signed)
Occupational Therapy Evaluation Patient Details Name: Margaretta Chittum MRN: [id number] DOB: May 31, 1934 Today's Date: 10/04/2020    History of Present Illness 87 y.o. female with a history of prior MI with stent placement in 2018, aortic stenosis, and GI bleed who had a fall.  Found to have L intertrochanteric hip fx with subsequent IM nailing ORIF 2/22.   Clinical Impression   Pt seen for OT evaluation this date in setting of acute hospitalization d/t fall, now s/p IM nailing and ORIF to L hip. Pt presents with moderate pain this date (LPN medicated pt during session) and endorses it increases with mobilization attempts. Pt reports being MOD I with fxl mobility and ADLs at baseline and even performing HH IADLs such as cooking and cleaning. Pt states that her daughter lives with her and her grandson checks on her every day.  Pt impacted by pain, but also some general deconditioning and weakness to UEs/LEs. Pt requires MOD/MAX A for LB ADLs, MOD/MAX A for bed mobility, and she declines to attempt standing trials with OT this date. In addition, pt on nasal cannula which she reports she is not typically on at home, and has to pace herself with sup<>sit transition including EOB sitting x4 minutes with PLB to slow RR and work of breathing. Pt back to bed with MAX A To manage LEs and MAX A with bed inverted to assist with propulsion towards HOB. Pt left in bed with all needs met and in reach. OT educates pt re: role of OT and potential goals of care including LB dressing and potential for education with adaptive equipment. Pt with good understanding. Pt will require f/u re: WB status as she states to this author several times "I'm not supposed to put any weight on that leg" motioning to her post-operative L LE, despite the physician's order stating touchdown WB. Will continue to follow acutely, but at this time, recommend STR given pt's current limitations with mobility and self care noted this date.    Follow Up  Recommendations  SNF    Equipment Recommendations  3 in 1 bedside commode;Tub/shower seat    Recommendations for Other Services       Precautions / Restrictions Precautions Precautions: Fall Restrictions Weight Bearing Restrictions: Yes LLE Weight Bearing: Touchdown weight bearing      Mobility Bed Mobility Overal bed mobility: Needs Assistance Bed Mobility: Supine to Sit;Sit to Supine     Supine to sit: Mod assist;Max assist Sit to supine: Max assist   General bed mobility comments: MOD/MAX A, use of draw sheet and extended time required to scoot to EOB sitting. MAX A for LE mgt back to bed    Transfers Overall transfer level: Needs assistance Equipment used: Rolling walker (2 wheeled) Transfers: Sit to/from Stand Sit to Stand: Total assist         General transfer comment: pt not willing to attempt standing trials with OT. Did make good effort to come to sitting, but endorses fatique from earlier PT session. In addition, she is concerned about having enough energy for later session with PT this afternoon.    Balance Overall balance assessment: Needs assistance   Sitting balance-Leahy Scale: Fair Sitting balance - Comments: requiring UE support and extended time and assist to sqaure up hips for EOB sitting. Once hips squared, pt does demo better static sitting, but still appears to favor leaning to R. Almost appears to be in an attempt to weight shift pressure off of L post-op LE.  Standing balance-Leahy Scale: Zero Standing balance comment: pt not agreeable to attempting standing trial. tells this author several times that she's not supposed to put any weight through the L LE despite the orders in the computer stating "Toe Touch Weightbearing."                           ADL either performed or assessed with clinical judgement   ADL                                         General ADL Comments: Pt able to perform seated UB ADLs  with SETUP and requires MOD/MAX A for LB ADLs such as threading underwear/pants over L LE d/t limited ROM.     Vision Patient Visual Report: No change from baseline       Perception     Praxis      Pertinent Vitals/Pain Pain Assessment: 0-10 Pain Score: 5  Pain Location: 2/10 at rest, 5/10 with mobilization to EOB sitting Pain Descriptors / Indicators: Operative site guarding;Tender Pain Intervention(s): Limited activity within patient's tolerance;Monitored during session;RN gave pain meds during session     Hand Dominance     Extremity/Trunk Assessment Upper Extremity Assessment Upper Extremity Assessment: Generalized weakness   Lower Extremity Assessment Lower Extremity Assessment: Generalized weakness (L LE with some post-op limitations in abd/add and hip flexion as to be expected given surgery, age and fall/pain.)       Communication Communication Communication: No difficulties   Cognition Arousal/Alertness: Awake/alert Behavior During Therapy: WFL for tasks assessed/performed Overall Cognitive Status: Within Functional Limits for tasks assessed                                 General Comments: Pt is HOH so some of her repsonses are delayed or require increased processing, but appears to be r/t hearing more than actual cognition. That being said, pt had this author repeat the answer to several rehab/therapy follow-up related questions   General Comments       Exercises Exercises: General Lower Extremity General Exercises - Lower Extremity Ankle Circles/Pumps: AROM;10 reps Quad Sets: Strengthening;10 reps Heel Slides: AAROM;5 reps Hip ABduction/ADduction: AROM;AAROM;10 reps Other Exercises Other Exercises: OT facilitates ed re: importance of regular OOB activity, role of OT in acute setting especially given ortho floor & s/p hip sx. Pt with moderate/good reception. OT educates pt re: potential need for rehabilitation and potential need for AE  training d/t limited L hip ROM and tolerance. Pt with moderate reception.   Shoulder Instructions      Home Living Family/patient expects to be discharged to:: Skilled nursing facility                                 Additional Comments: Pt's dtr lives with her in [facility] and she reports that her grandson is 73 and visits her every single day.      Prior Functioning/Environment Level of Independence: Independent with assistive device(s)        Comments: Prior to fall pt was able to be up ad lib with cane or walker, could go out with family to run errands, and performed [facility] IADLs in addition to her self care BADLs.  OT Problem List: Decreased strength;Decreased range of motion;Decreased activity tolerance;Impaired balance (sitting and/or standing);Decreased knowledge of use of DME or AE;Decreased knowledge of precautions;Cardiopulmonary status limiting activity;Obesity;Pain;Increased edema      OT Treatment/Interventions:      OT Goals(Current goals can be found in the care plan section) Acute Rehab OT Goals Patient Stated Goal: do as much as I can for myself and ideally to go home with my daughter's help OT Goal Formulation: With patient Time For Goal Achievement: 10/18/20 Potential to Achieve Goals: Fair ADL Goals Pt Will Perform Lower Body Dressing: with min assist;with adaptive equipment;sit to/from stand (with LRAD) Pt Will Transfer to Toilet: with min assist;with mod assist;stand pivot transfer;bedside commode Pt Will Perform Toileting - Clothing Manipulation and hygiene: with min assist;with mod assist;sitting/lateral leans Pt/caregiver will Perform Home Exercise Program: Increased strength;Both right and left upper extremity;With minimal assist  OT Frequency:     Barriers to D/C:            Co-evaluation              AM-PAC OT "6 Clicks" Daily Activity     Outcome Measure Help from another person eating meals?: None Help from another person  taking care of personal grooming?: A Little Help from another person toileting, which includes using toliet, bedpan, or urinal?: A Lot Help from another person bathing (including washing, rinsing, drying)?: A Lot Help from another person to put on and taking off regular upper body clothing?: A Little Help from another person to put on and taking off regular lower body clothing?: A Lot 6 Click Score: 16   End of Session Nurse Communication: Mobility status;Other (comment) (notified that the lower of two dressings to pt's L thigh was moderately saturated with serosanguinous fluid. Does not appear to be actively bleeding, but is noted to be rather saturated.)  Activity Tolerance: Patient tolerated treatment well;Patient limited by pain;Patient limited by fatigue Patient left: in bed;with call bell/phone within reach;with bed alarm set  OT Visit Diagnosis: Unsteadiness on feet (R26.81);Muscle weakness (generalized) (M62.81);Pain Pain - Right/Left: Left Pain - part of body: Leg;Hip                Time: 5027-7412 OT Time Calculation (min): 61 min Charges:  OT General Charges $OT Visit: 1 Visit OT Evaluation $OT Eval Moderate Complexity: 1 Mod OT Treatments $Self Care/Home Management : 23-37 mins $Therapeutic Activity: 23-37 mins  Gerrianne Scale, MS, OTR/L ascom [phone] 10/04/20, 3:30 PM

## 2020-10-04 NOTE — NC FL2 (Signed)
[place] MEDICAID [id number] LEVEL OF CARE SCREENING TOOL     IDENTIFICATION  Patient Name: Monica [name] Birthdate: Oct 06, 1933 Sex: female Admission Date (Current Location): 10/02/2020  [place] and [place] Number:  Chiropodist and Address:  [facility], [address] [place], [place] [address]      Provider Number: [id number]  Attending Physician Name and Address:  Pennie Banter, DO  Relative Name and Phone Number:       Current Level of Care: Hospital Recommended Level of Care: Skilled Nursing Facility Prior Approval Number:    Date Approved/Denied:   PASRR Number: Need to get SS#  Discharge Plan: SNF    Current Diagnoses: Patient Active Problem List   Diagnosis Date Noted  . Severe aortic stenosis   . Closed comminuted intertrochanteric fracture of proximal end of left femur (HCC) 10/02/2020  . Aortic stenosis   . Obesity   . Cellulitis of left lower extremity 04/29/2018  . GI bleed 06/05/2017  . Nausea vomiting and diarrhea   . Acute ST elevation myocardial infarction (STEMI) of inferior wall (HCC) 06/02/2017  . CAD (coronary artery disease) 2018    Orientation RESPIRATION BLADDER Height & Weight     Self,Situation,Time,Place  O2 (Nasal 6 L. Will wean down prior to discharge.) Incontinent,Indwelling catheter Weight: 240 lb (108.9 kg) Height:  5\' 5"  (165.1 cm)  BEHAVIORAL SYMPTOMS/MOOD NEUROLOGICAL BOWEL NUTRITION STATUS   (None)  (None) Continent Diet (Regular)  AMBULATORY STATUS COMMUNICATION OF NEEDS Skin   Extensive Assist Verbally Surgical wounds,Bruising (Incisions on left hip (honeycomb, transparent dressing), left lateral thigh (honeycomb), and left lateral knee (gauze, transparent dressing).)                       Personal Care Assistance Level of Assistance  Bathing,Feeding,Dressing Bathing Assistance: Maximum assistance Feeding assistance: Limited assistance Dressing Assistance: Maximum assistance      Functional Limitations Info  Sight,Hearing,Speech Sight Info: Adequate Hearing Info: Adequate Speech Info: Adequate    SPECIAL CARE FACTORS FREQUENCY  PT (By licensed PT),OT (By licensed OT)     PT Frequency: 5 x week OT Frequency: 5 x week            Contractures Contractures Info: Not present    Additional Factors Info  Code Status,Allergies Code Status Info: Full code Allergies Info: NKDA           Current Medications (10/04/2020):  This is the current hospital active medication list Current Facility-Administered Medications  Medication Dose Route Frequency Provider Last Rate Last Admin  . acetaminophen (TYLENOL) tablet 1,000 mg  1,000 mg Oral Q8H 10/06/2020, MD   1,000 mg at 10/04/20 1421  . aspirin EC tablet 81 mg  81 mg Oral Daily 10/06/20, MD   81 mg at 10/04/20 0856  . bisacodyl (DULCOLAX) EC tablet 5 mg  5 mg Oral Daily PRN 10/06/20 A, DO      . bisacodyl (DULCOLAX) suppository 10 mg  10 mg Rectal Daily PRN Esaw Grandchild, MD      . Chlorhexidine Gluconate Cloth 2 % PADS 6 each  6 each Topical Daily Signa Kell, MD   6 each at 10/04/20 1058  . docusate sodium (COLACE) capsule 100 mg  100 mg Oral BID 10/06/20, MD   100 mg at 10/04/20 0854  . enoxaparin (LOVENOX) injection 40 mg  40 mg Subcutaneous Q24H 10/06/20, MD   40 mg at 10/04/20 10/06/20  . feeding  supplement (ENSURE ENLIVE / ENSURE PLUS) liquid 237 mL  237 mL Oral BID BM Drema Dallas, MD   237 mL at 10/04/20 1421  . HYDROmorphone (DILAUDID) injection 0.25-0.5 mg  0.25-0.5 mg Intravenous Q2H PRN Signa Kell, MD      . methocarbamol (ROBAXIN) tablet 500 mg  500 mg Oral Q6H PRN Signa Kell, MD   500 mg at 10/04/20 8299   Or  . methocarbamol (ROBAXIN) 500 mg in dextrose 5 % 50 mL IVPB  500 mg Intravenous Q6H PRN Signa Kell, MD      . metoCLOPramide (REGLAN) tablet 5-10 mg  5-10 mg Oral Q8H PRN Signa Kell, MD       Or  . metoCLOPramide (REGLAN) injection 5-10 mg  5-10 mg Intravenous  Q8H PRN Signa Kell, MD      . multivitamin with minerals tablet 1 tablet  1 tablet Oral Daily Drema Dallas, MD   1 tablet at 10/04/20 [phone]  . ondansetron (ZOFRAN) tablet 4 mg  4 mg Oral Q6H PRN Signa Kell, MD       Or  . ondansetron [facility]) injection 4 mg  4 mg Intravenous Q6H PRN Signa Kell, MD      . oxyCODONE (Oxy IR/ROXICODONE) immediate release tablet 2.5-5 mg  2.5-5 mg Oral Q3H PRN Signa Kell, MD   5 mg at 10/04/20 0853  . oxyCODONE (Oxy IR/ROXICODONE) immediate release tablet 5-10 mg  5-10 mg Oral Q4H PRN Signa Kell, MD      . polyethylene glycol (MIRALAX / GLYCOLAX) packet 17 g  17 g Oral Daily Esaw Grandchild A, DO   17 g at 10/04/20 1422  . rosuvastatin (CRESTOR) tablet 10 mg  10 mg Oral Daily Signa Kell, MD   10 mg at 10/04/20 0854  . senna-docusate (Senokot-S) tablet 1 tablet  1 tablet Oral QHS PRN Signa Kell, MD      . senna-docusate (Senokot-S) tablet 1 tablet  1 tablet Oral BID Esaw Grandchild A, DO   1 tablet at 10/04/20 1425  . sodium phosphate (FLEET) 7-19 GM/118ML enema 1 enema  1 enema Rectal Once PRN Signa Kell, MD      . traMADol Janean Sark) tablet 50 mg  50 mg Oral Q6H PRN Signa Kell, MD         Discharge Medications: Please see discharge summary for a list of discharge medications.  Relevant Imaging Results:  Relevant Lab Results:   Additional Information Need to get her SS#  CORNELL GABER, LCSW

## 2020-10-04 NOTE — Progress Notes (Signed)
  Subjective: 1 Day Post-Op Procedure(s) (LRB): INTRAMEDULLARY (IM) NAIL INTERTROCHANTRIC (Left) Patient reports pain as moderate.   Patient is well, and has had no acute complaints or problems Plan is to go Rehab after hospital stay. Negative for chest pain and shortness of breath Fever: no Gastrointestinal: Negative for nausea and vomiting  Objective: Vital signs in last 24 hours: Temp:  [96.9 F (36.1 C)-98.3 F (36.8 C)] 97.8 F (36.6 C) (02/23 0443) Pulse Rate:  [76-92] 87 (02/23 0443) Resp:  [9-21] 16 (02/23 0443) BP: (95-132)/(54-90) 100/63 (02/23 0443) SpO2:  [84 %-100 %] 100 % (02/23 0443) Arterial Line BP: (86-108)/(41-53) 108/51 (02/22 1807)  Intake/Output from previous day:  Intake/Output Summary (Last 24 hours) at 10/04/2020 0716 Last data filed at 10/04/2020 0430 Gross per 24 hour  Intake 3997.66 ml  Output 780 ml  Net 3217.66 ml    Intake/Output this shift: No intake/output data recorded.  Labs: Recent Labs    10/02/20 0907 10/04/20 0423  HGB 13.9 9.8*   Recent Labs    10/02/20 0907 10/04/20 0423  WBC 10.9* 12.3*  RBC 5.03 3.49*  HCT 43.9 31.0*  PLT 196 145*   Recent Labs    10/02/20 0907 10/04/20 0423  NA 136 132*  K 4.2 4.7  CL 104 102  CO2 26 25  BUN 19 19  CREATININE 0.87 0.91  GLUCOSE 118* 129*  CALCIUM 8.7* 7.9*   Recent Labs    10/02/20 1153  INR 1.1     EXAM General - Patient is Alert and Oriented Extremity - Neurovascular intact Sensation intact distally Dorsiflexion/Plantar flexion intact Compartment soft Dressing/Incision - clean, dry, blood tinged drainage at the distal incision Motor Function - intact, moving foot and toes well on exam.   Past Medical History:  Diagnosis Date  . Aortic stenosis    a. LHC 06/02/17: At least moderate aortic stenosis with a peak to peak gradient of 22 mmHg; b. TTE 05/2017: EF 55-60%, mild HK basal-midinferior wall, Gr1DD, mod to sev AS w/ mean gradient 21 mmHg, valve area 0.99,  mild MR, mildly dilated LA   . CAD (coronary artery disease) 2018   a. inferior STEMI 06/02/2017: LHC 06/02/17: LM 20, D1 20%, o-pLCx 50, p-mRCA 100% s/p PCI/DES, mRCA 50, dRCA 30  . GI bleed    a. noted 06/05/2017  . HLD (hyperlipidemia)   . Obesity     Assessment/Plan: 1 Day Post-Op Procedure(s) (LRB): INTRAMEDULLARY (IM) NAIL INTERTROCHANTRIC (Left) Principal Problem:   Closed comminuted intertrochanteric fracture of proximal end of left femur (HCC) Active Problems:   Aortic stenosis   Obesity   CAD (coronary artery disease)  Estimated body mass index is 39.94 kg/m as calculated from the following:   Height as of this encounter: 5\' 5"  (1.651 m).   Weight as of this encounter: 108.9 kg. Advance diet Up with therapy D/C IV fluids   Follow-up at [facility] clinic orthopedics in 2 weeks for staple removal and x-rays of the left femur  DVT Prophylaxis - Lovenox, Foot Pumps and TED hose Toe-touch Weight-Bearing to left leg  [facility], PA-C Orthopaedic Surgery 10/04/2020, 7:16 AM

## 2020-10-04 NOTE — Evaluation (Signed)
Physical Therapy Evaluation Patient Details Name: Monica [name] MRN: [id number] DOB: 1933-12-13 Today's Date: 10/04/2020   History of Present Illness  87 y.o. female with a history of prior MI with stent placement in 2018, aortic stenosis, and GI bleed who had a fall.  Found to have L intertrochanteric hip fx with subsequent IM nailing ORIF 2/22.  Clinical Impression  Pt pleasant and eager to do what she could with PT but frankly she was very functionally limited.  She showed good effort with getting to/from sitting but ultimately needed heavy assist.  With attempts to stand she reports that her L LE is normally her stronger leg and that pushing with the R while maintaining minimal/no WBing on the L would be difficult.  This proved to be very true and she did not attain fully upright on multiple heavily assisted attempts with PT.  Overall good effort and attitude but very functionally limited, pt will need STR despite hope that she would be able to manage at home.     Follow Up Recommendations SNF    Equipment Recommendations   (TBD at next venue of care)    Recommendations for Other Services       Precautions / Restrictions Precautions Precautions: Fall Restrictions Weight Bearing Restrictions: Yes LLE Weight Bearing: Touchdown weight bearing      Mobility  Bed Mobility Overal bed mobility: Needs Assistance Bed Mobility: Supine to Sit;Sit to Supine     Supine to sit: Max assist;Mod assist Sit to supine: Max assist   General bed mobility comments: Pt showed good effort, but simply could not transition w/o heavy assist    Transfers Overall transfer level: Needs assistance Equipment used: Rolling walker (2 wheeled) Transfers: Sit to/from Stand Sit to Stand: Total assist         General transfer comment: 3 attempts at standing with only minimal success.  After raising bed ~4" and with heavy assist she was able to assume near standing but struggled with NWBing and could  not tolerate fully upright standing, heavy max assist from PT t/o each effort  Ambulation/Gait             General Gait Details: unable/unsafe  Stairs            Wheelchair Mobility    Modified Rankin (Stroke Patients Only)       Balance Overall balance assessment: Needs assistance   Sitting balance-Leahy Scale: Good       Standing balance-Leahy Scale: Zero                               Pertinent Vitals/Pain Pain Assessment: 0-10 Pain Score: 2  Pain Location: increased pain with increased activity, 2/10 at rest    Home Living Family/patient expects to be discharged to:: Skilled nursing facility                      Prior Function Level of Independence: Independent with assistive device(s)         Comments: Prior to fall pt was able to be up ad lib with walker, could go out with family to run errands, etc     Hand Dominance        Extremity/Trunk Assessment   Upper Extremity Assessment Upper Extremity Assessment: Generalized weakness    Lower Extremity Assessment Lower Extremity Assessment: Generalized weakness (expected limitations on the L with very little AROM)  Communication   Communication: No difficulties  Cognition Arousal/Alertness: Awake/alert Behavior During Therapy: WFL for tasks assessed/performed Overall Cognitive Status: Within Functional Limits for tasks assessed                                        General Comments      Exercises General Exercises - Lower Extremity Ankle Circles/Pumps: AROM;10 reps Quad Sets: Strengthening;10 reps Heel Slides: AAROM;5 reps Hip ABduction/ADduction: AROM;AAROM;10 reps   Assessment/Plan    PT Assessment Patient needs continued PT services  PT Problem List Decreased strength;Decreased range of motion;Decreased activity tolerance;Decreased balance;Decreased mobility;Decreased safety awareness;Decreased knowledge of use of DME;Decreased  knowledge of precautions;Pain       PT Treatment Interventions DME instruction;Gait training;Stair training;Therapeutic activities;Therapeutic exercise;Balance training;Neuromuscular re-education;Functional mobility training;Patient/family education    PT Goals (Current goals can be found in the Care Plan section)  Acute Rehab PT Goals Patient Stated Goal: get walking again PT Goal Formulation: With patient Time For Goal Achievement: 10/18/20 Potential to Achieve Goals: Fair    Frequency BID   Barriers to discharge        Co-evaluation               AM-PAC PT "6 Clicks" Mobility  Outcome Measure Help needed turning from your back to your side while in a flat bed without using bedrails?: A Lot Help needed moving from lying on your back to sitting on the side of a flat bed without using bedrails?: A Lot Help needed moving to and from a bed to a chair (including a wheelchair)?: Total Help needed standing up from a chair using your arms (e.g., wheelchair or bedside chair)?: Total Help needed to walk in hospital room?: Total Help needed climbing 3-5 steps with a railing? : Total 6 Click Score: 8    End of Session Equipment Utilized During Treatment: Gait belt Activity Tolerance: Patient limited by pain;Patient limited by fatigue Patient left: with bed alarm set;with call bell/phone within reach;with family/visitor present Nurse Communication: Mobility status PT Visit Diagnosis: Muscle weakness (generalized) (M62.81);Difficulty in walking, not elsewhere classified (R26.2);Pain Pain - Right/Left: Left Pain - part of body: Hip    Time: 2263-3354 PT Time Calculation (min) (ACUTE ONLY): 40 min   Charges:   PT Evaluation $PT Eval Low Complexity: 1 Low PT Treatments $Therapeutic Exercise: 8-22 mins $Therapeutic Activity: 8-22 mins        Malachi Pro, DPT 10/04/2020, 1:58 PM

## 2020-10-04 NOTE — TOC Initial Note (Signed)
Transition of Care [facility]) - Initial/Assessment Note    Patient Details  Name: Monica [name] MRN: [id number] Date of Birth: 15-Aug-1933  Transition [facility] Care [facility]) CM/SW Contact:    Margarito Liner, LCSW Phone Number: 10/04/2020, 3:47 PM  Clinical Narrative:   CSW called patient in room, introduced role, and explained that PT recommendations would be discussed. Patient unsure about SNF placement and wants to discuss with her children. Patient gave CSW permission to send out her referral in case they decide to move forward with SNF. Patient's daughter moved in with her when her husband became sick and her grandson checks in on them daily. No further concerns. CSW encouraged patient to contact CSW as needed. CSW will continue to follow patient for support and facilitate discharge when medically stable.               Expected Discharge Plan: Skilled Nursing Facility Barriers to Discharge: Continued Medical Work up   Patient Goals and CMS Choice        Expected Discharge Plan and Services Expected Discharge Plan: Skilled Nursing Facility     Post Acute Care Choice:  (TBD) Living arrangements for the past 2 months: Single Family Home                                      Prior Living Arrangements/Services Living arrangements for the past 2 months: Single Family Home Lives with:: Spouse,Adult Children Patient language and need for interpreter reviewed:: Yes Do you feel safe going back to the place where you live?: Yes      Need for Family Participation in Patient Care: Yes (Comment)     Criminal Activity/Legal Involvement Pertinent to Current Situation/Hospitalization: No - Comment as needed  Activities of Daily Living      Permission Sought/Granted Permission sought to share information with : Photographer granted to share info w AGENCY: SNF's        Emotional Assessment Appearance:: Appears stated age Attitude/Demeanor/Rapport:  Engaged Affect (typically observed): Appropriate,Calm,Pleasant Orientation: : Oriented to Self,Oriented to Place,Oriented to  Time,Oriented to Situation Alcohol / Substance Use: Not Applicable Psych Involvement: No (comment)  Admission diagnosis:  Closed fracture of left hip, initial encounter (HCC) [S72.002A] Closed comminuted intertrochanteric fracture of left femur, initial encounter (HCC) [S72.142A] Patient Active Problem List   Diagnosis Date Noted  . Severe aortic stenosis   . Closed comminuted intertrochanteric fracture of proximal end of left femur (HCC) 10/02/2020  . Aortic stenosis   . Obesity   . Cellulitis of left lower extremity 04/29/2018  . GI bleed 06/05/2017  . Nausea vomiting and diarrhea   . Acute ST elevation myocardial infarction (STEMI) of inferior wall (HCC) 06/02/2017  . CAD (coronary artery disease) 2018   PCP:  Patient, No Pcp Per Pharmacy:   [facility] [address] ([address]),  - [address] [place]) [place] [address] Phone: [phone] Fax: [phone]     Social Determinants of Health (SDOH) Interventions    Readmission Risk Interventions No flowsheet data found.

## 2020-10-04 NOTE — Discharge Instructions (Signed)
INSTRUCTIONS AFTER Surgery  o Remove items at home which could result in a fall. This includes throw rugs or furniture in walking pathways o ICE to the affected joint every three hours while awake for 30 minutes at a time, for at least the first 3-5 days, and then as needed for pain and swelling.  Continue to use ice for pain and swelling. You may notice swelling that will progress down to the foot and ankle.  This is normal after surgery.  Elevate your leg when you are not up walking on it.   o Continue to use the breathing machine you got in the hospital (incentive spirometer) which will help keep your temperature down.  It is common for your temperature to cycle up and down following surgery, especially at night when you are not up moving around and exerting yourself.  The breathing machine keeps your lungs expanded and your temperature down.   DIET:  As you were doing prior to hospitalization, we recommend a well-balanced diet.  DRESSING / WOUND CARE / SHOWERING  Dressing change as needed.  No showering.  Staples will be removed in 2 weeks at [facility] clinic orthopedics.  ACTIVITY  o Increase activity slowly as tolerated, but follow the weight bearing instructions below.   o No driving for 6 weeks or until further direction given by your physician.  You cannot drive while taking narcotics.  o No lifting or carrying greater than 10 lbs. until further directed by your surgeon. o Avoid periods of inactivity such as sitting longer than an hour when not asleep. This helps prevent blood clots.  o You may return to work once you are authorized by your doctor.     WEIGHT BEARING  Toe-touch weightbearing on the left   EXERCISES Gait training and ambulation training with physical therapy.  CONSTIPATION  Constipation is defined medically as fewer than three stools per week and severe constipation as less than one stool per week.  Even if you have a regular bowel pattern at home, your normal  regimen is likely to be disrupted due to multiple reasons following surgery.  Combination of anesthesia, postoperative narcotics, change in appetite and fluid intake all can affect your bowels.   YOU MUST use at least one of the following options; they are listed in order of increasing strength to get the job done.  They are all available over the counter, and you may need to use some, POSSIBLY even all of these options:    Drink plenty of fluids (prune juice may be helpful) and high fiber foods Colace 100 mg by mouth twice a day  Senokot for constipation as directed and as needed Dulcolax (bisacodyl), take with full glass of water  Miralax (polyethylene glycol) once or twice a day as needed.  If you have tried all these things and are unable to have a bowel movement in the first 3-4 days after surgery call either your surgeon or your primary doctor.    If you experience loose stools or diarrhea, hold the medications until you stool forms back up.  If your symptoms do not get better within 1 week or if they get worse, check with your doctor.  If you experience "the worst abdominal pain ever" or develop nausea or vomiting, please contact the office immediately for further recommendations for treatment.   ITCHING:  If you experience itching with your medications, try taking only a single pain pill, or even half a pain pill at a time.  You can also use Benadryl over the counter for itching or also to help with sleep.   TED HOSE STOCKINGS:  Use stockings on both legs until for at least 2 weeks or as directed by physician office. They may be removed at night for sleeping.  MEDICATIONS:  See your medication summary on the After Visit Summary that nursing will review with you.  You may have some home medications which will be placed on hold until you complete the course of blood thinner medication.  It is important for you to complete the blood thinner medication as prescribed.  PRECAUTIONS:  If you  experience chest pain or shortness of breath - call 911 immediately for transfer to the hospital emergency department.   If you develop a fever greater that 101 F, purulent drainage from wound, increased redness or drainage from wound, foul odor from the wound/dressing, or calf pain - CONTACT YOUR SURGEON.                                                   FOLLOW-UP APPOINTMENTS:  If you do not already have a post-op appointment, please call the office for an appointment to be seen by your surgeon.  Guidelines for how soon to be seen are listed in your After Visit Summary, but are typically between 1-4 weeks after surgery.  OTHER INSTRUCTIONS:     MAKE SURE YOU:   Understand these instructions.   Get help right away if you are not doing well or get worse.    Thank you for letting us be a part of your medical care team.  It is a privilege we respect greatly.  We hope these instructions will help you stay on track for a fast and full recovery!

## 2020-10-05 ENCOUNTER — Inpatient Hospital Stay: Payer: Medicare Other

## 2020-10-05 ENCOUNTER — Encounter: Payer: Self-pay | Admitting: Internal Medicine

## 2020-10-05 DIAGNOSIS — I25118 Atherosclerotic heart disease of native coronary artery with other forms of angina pectoris: Secondary | ICD-10-CM | POA: Diagnosis not present

## 2020-10-05 DIAGNOSIS — J9601 Acute respiratory failure with hypoxia: Secondary | ICD-10-CM | POA: Diagnosis not present

## 2020-10-05 DIAGNOSIS — S72142A Displaced intertrochanteric fracture of left femur, initial encounter for closed fracture: Secondary | ICD-10-CM | POA: Diagnosis not present

## 2020-10-05 DIAGNOSIS — I350 Nonrheumatic aortic (valve) stenosis: Secondary | ICD-10-CM | POA: Diagnosis not present

## 2020-10-05 LAB — COMPREHENSIVE METABOLIC PANEL
ALT: 9 U/L (ref 0–44)
AST: 43 U/L — ABNORMAL HIGH (ref 15–41)
Albumin: 2.6 g/dL — ABNORMAL LOW (ref 3.5–5.0)
Alkaline Phosphatase: 42 U/L (ref 38–126)
Anion gap: 5 (ref 5–15)
BUN: 26 mg/dL — ABNORMAL HIGH (ref 8–23)
CO2: 27 mmol/L (ref 22–32)
Calcium: 7.9 mg/dL — ABNORMAL LOW (ref 8.9–10.3)
Chloride: 102 mmol/L (ref 98–111)
Creatinine, Ser: 0.88 mg/dL (ref 0.44–1.00)
GFR, Estimated: >60 mL/min (ref >60)
Glucose, Bld: 120 mg/dL — ABNORMAL HIGH (ref 70–99)
Potassium: 4.8 mmol/L (ref 3.5–5.1)
Sodium: 134 mmol/L — ABNORMAL LOW (ref 135–145)
Total Bilirubin: 1.6 mg/dL — ABNORMAL HIGH (ref 0.3–1.2)
Total Protein: 5.7 g/dL — ABNORMAL LOW (ref 6.5–8.1)

## 2020-10-05 LAB — CBC WITH DIFFERENTIAL/PLATELET
Abs Immature Granulocytes: 0.04 10*3/uL (ref 0.00–0.07)
Basophils Absolute: 0 10*3/uL (ref 0.0–0.1)
Basophils Relative: 0 %
Eosinophils Absolute: 0.3 10*3/uL (ref 0.0–0.5)
Eosinophils Relative: 3 %
HCT: 25.8 % — ABNORMAL LOW (ref 36.0–46.0)
Hemoglobin: 8.5 g/dL — ABNORMAL LOW (ref 12.0–15.0)
Immature Granulocytes: 0 %
Lymphocytes Relative: 19 %
Lymphs Abs: 2 10*3/uL (ref 0.7–4.0)
MCH: 28.6 pg (ref 26.0–34.0)
MCHC: 32.9 g/dL (ref 30.0–36.0)
MCV: 86.9 fL (ref 80.0–100.0)
Monocytes Absolute: 1.3 10*3/uL — ABNORMAL HIGH (ref 0.1–1.0)
Monocytes Relative: 12 %
Neutro Abs: 6.8 10*3/uL (ref 1.7–7.7)
Neutrophils Relative %: 66 %
Platelets: 145 10*3/uL — ABNORMAL LOW (ref 150–400)
RBC: 2.97 MIL/uL — ABNORMAL LOW (ref 3.87–5.11)
RDW: 14.6 % (ref 11.5–15.5)
WBC: 10.4 10*3/uL (ref 4.0–10.5)
nRBC: 0 % (ref 0.0–0.2)

## 2020-10-05 LAB — D-DIMER, QUANTITATIVE: D-Dimer, Quant: 5.73 ug/mL-FEU — ABNORMAL HIGH (ref 0.00–0.50)

## 2020-10-05 LAB — MAGNESIUM: Magnesium: 2.2 mg/dL (ref 1.7–2.4)

## 2020-10-05 LAB — BRAIN NATRIURETIC PEPTIDE: B Natriuretic Peptide: 79.2 pg/mL (ref 0.0–100.0)

## 2020-10-05 LAB — PHOSPHORUS: Phosphorus: 2.8 mg/dL (ref 2.5–4.6)

## 2020-10-05 LAB — PROCALCITONIN: Procalcitonin: 0.26 ng/mL

## 2020-10-05 MED ORDER — IOHEXOL 350 MG/ML SOLN
75.0000 mL | Freq: Once | INTRAVENOUS | Status: AC | PRN
  Administered 2020-10-05: 75 mL via INTRAVENOUS

## 2020-10-05 NOTE — Progress Notes (Addendum)
PT Cancellation Note  Patient Details Name: Monica [name] MRN: [id number] DOB: [birth year]   Cancelled Treatment:     PT/OT co treat attempted this afternoon. Pt refused. " This has been the longest / hardest day . I've been busy all day. I need to rest." Will return in AM and continue to follow pt per POC. Still awaiting results from CT scan.    Rushie Chestnut 10/05/2020, 2:07 PM

## 2020-10-05 NOTE — TOC Progression Note (Signed)
Transition of Care [facility]) - Progression Note    Patient Details  Name: Mystery Schrupp MRN: [id number] Date of Birth: 1934/04/21  Transition of Care [facility]) CM/SW [place], LCSW Phone Number: 10/05/2020, 11:23 AM  Clinical Narrative:       Met with patient and daughter Corky Downs at bedside. Provided list of SNFs from [email or web address]. Patient and daughter to review list and call CSW with preferred SNFs. Patient does not have her COVID vaccines, explained COVID isolation and they verbalized understanding. Prefer a SNF where they can still visit. Daughter to also get patients SS# for CSW so that PASRR screening can be completed.  Expected Discharge Plan: [place] Barriers to Discharge: Continued Medical Work up  Expected Discharge Plan and Services Expected Discharge Plan: [place] Acute Care Choice:  (TBD) Living arrangements for the past 2 months: Single Family Home                                       Social Determinants of Health (SDOH) Interventions    Readmission Risk Interventions No flowsheet data found.

## 2020-10-05 NOTE — Progress Notes (Signed)
OT Cancellation Note  Patient Details Name: Monica [name] MRN: [id number] DOB: 02-29-1934   Cancelled Treatment:    Reason Eval/Treat Not Completed: Patient declined, no reason specified  Pt politely but adamantly declines to participate with therapy (PT/OT) at this time citing being very fatigued and frustrated with how this day has gone including tests (PE w/u pending, MD okay'ed continued therapy) and trying to work on discharge planning. Will f/u for OT Treatment at later date/time as able/pt agreeable. Thank you.  Rejeana Brock, MS, OTR/L ascom [phone] 10/05/20, 3:46 PM

## 2020-10-05 NOTE — Progress Notes (Signed)
Progress Note  Patient Name: Monica [name] Date of Encounter: 10/05/2020  [facility] Cardiologist: Lorine Bears, MD   Subjective   Reports does not feel as well today Combination of not sleeping well, pain medication, worsening pain left upper extremity, also pain at surgical site left hip Eating okay Debating on which rehab to go to  Inpatient Medications    Scheduled Meds: . acetaminophen  1,000 mg Oral Q8H  . aspirin EC  81 mg Oral Daily  . Chlorhexidine Gluconate Cloth  6 each Topical Daily  . docusate sodium  100 mg Oral BID  . enoxaparin (LOVENOX) injection  40 mg Subcutaneous Q24H  . feeding supplement  237 mL Oral BID BM  . multivitamin with minerals  1 tablet Oral Daily  . polyethylene glycol  17 g Oral Daily  . rosuvastatin  10 mg Oral Daily  . senna-docusate  1 tablet Oral BID   Continuous Infusions: . methocarbamol (ROBAXIN) IV     PRN Meds: bisacodyl, bisacodyl, HYDROmorphone (DILAUDID) injection, methocarbamol **OR** methocarbamol (ROBAXIN) IV, metoCLOPramide **OR** metoCLOPramide (REGLAN) injection, ondansetron **OR** ondansetron (ZOFRAN) IV, oxyCODONE, oxyCODONE, senna-docusate, sodium phosphate, traMADol   Vital Signs    Vitals:   10/05/20 0434 10/05/20 0802 10/05/20 1136 10/05/20 1521  BP: 111/68 115/68 (!) 97/59 (!) 97/54  Pulse: 89 90 90 93  Resp: 18 19 18 18   Temp: 98 F (36.7 C) 98.6 F (37 C) 98.4 F (36.9 C) 98.3 F (36.8 C)  TempSrc:      SpO2: 97% 97% 98% 96%  Weight:      Height:        Intake/Output Summary (Last 24 hours) at 10/05/2020 1612 Last data filed at 10/05/2020 1519 Gross per 24 hour  Intake 600 ml  Output 1750 ml  Net -1150 ml   Last 3 Weights 10/02/2020 05/16/2020 11/16/2019  Weight (lbs) 240 lb 256 lb 240 lb  Weight (kg) 108.863 kg 116.121 kg 108.863 kg      Telemetry      ECG     - Personally Reviewed  Physical Exam   GEN: No acute distress.  Obese, laying supine in bed Neck:  Unable to estimate  JVD Cardiac: RRR, 3/6 SEM RSB, rubs, or gallops.  Respiratory: Clear to auscultation bilaterally. GI: Soft, nontender, non-distended  MS: No edema; No deformity. Neuro:  Nonfocal  Psych: Normal affect   Labs    High Sensitivity Troponin:   Recent Labs  Lab 10/02/20 0907 10/02/20 1153  TROPONINIHS 14 12      Chemistry Recent Labs  Lab 10/02/20 0907 10/04/20 0423 10/05/20 0441  NA 136 132* 134*  K 4.2 4.7 4.8  CL 104 102 102  CO2 26 25 27   GLUCOSE 118* 129* 120*  BUN 19 19 26*  CREATININE 0.87 0.91 0.88  CALCIUM 8.7* 7.9* 7.9*  PROT  --  5.9* 5.7*  ALBUMIN  --  2.6* 2.6*  AST  --  39 43*  ALT  --  12 9  ALKPHOS  --  46 42  BILITOT  --  1.5* 1.6*  GFRNONAA >60 >60 >60  ANIONGAP 6 5 5      Hematology Recent Labs  Lab 10/02/20 0907 10/04/20 0423 10/05/20 0441  WBC 10.9* 12.3* 10.4  RBC 5.03 3.49* 2.97*  HGB 13.9 9.8* 8.5*  HCT 43.9 31.0* 25.8*  MCV 87.3 88.8 86.9  MCH 27.6 28.1 28.6  MCHC 31.7 31.6 32.9  RDW 14.6 14.3 14.6  PLT 196 145* 145*  BNP Recent Labs  Lab 10/02/20 0907 10/05/20 0441  BNP 178.2* 79.2     DDimer  Recent Labs  Lab 10/05/20 0946  DDIMER 5.73*     Radiology    CT ANGIO CHEST PE W OR WO CONTRAST  Result Date: 10/05/2020 CLINICAL DATA:  Elevated D-dimer, recent surgery EXAM: CT ANGIOGRAPHY CHEST WITH CONTRAST TECHNIQUE: Multidetector CT imaging of the chest was performed using the standard protocol during bolus administration of intravenous contrast. Multiplanar CT image reconstructions and MIPs were obtained to evaluate the vascular anatomy. CONTRAST:  36mL OMNIPAQUE IOHEXOL 350 MG/ML SOLN COMPARISON:  None. FINDINGS: Cardiovascular: Satisfactory opacification of the pulmonary arteries to the proximal segmental level. However, there is respiratory motion. Question of nonocclusive right basal subsegmental and left lower lobe segmental and subsegmental filling defects. Enlargement of the main pulmonary artery measuring 3.7 cm  suggesting pulmonary arterial hypertension. Cardiomegaly. Coronary artery calcification. No pericardial effusion. Thoracic aorta is normal in caliber with mixed plaque. Mediastinum/Nodes: Mediastinal adenopathy is present. A prevascular node on image 26 of series 4 measures 1.7 cm in short axis. Subcarinal node on image 42 also measures 2 cm in short axis. Nonenlarged hilar nodes. Small hiatal hernia. Thyroid is unremarkable. Lungs/Pleura: Imaging in expiration. Interstitial thickening and patchy ground-glass density. Bibasilar atelectasis. Upper Abdomen: No acute abnormality.  Cholelithiasis. Musculoskeletal: Degenerative changes of the spine. No acute osseous abnormality. Review of the MIP images confirms the above findings. IMPRESSION: No definite pulmonary embolism. Question of nonocclusive right basal subsegmental and left lower lobe segmental and subsegmental filling defects. There is significant respiratory motion and these could be artifactual. Bibasilar atelectasis. Interstitial thickening and patchy ground-glass density, which could reflect additional atelectasis (imaging in expiration), mild edema, or less likely atypical pneumonia. Mediastinal adenopathy. Cardiomegaly. Enlargement of the main pulmonary artery suggesting pulmonary arterial hypertension. Electronically Signed   By: Guadlupe Spanish M.D.   On: 10/05/2020 15:06   DG Chest Port 1 View  Result Date: 10/05/2020 CLINICAL DATA:  Acute respiratory failure EXAM: PORTABLE CHEST 1 VIEW COMPARISON:  10/02/2020 FINDINGS: Cardiac enlargement without heart failure. Prominent lung markings diffusely unchanged. No focal consolidation or effusion. Chronic right rib fractures. IMPRESSION: No interval change. Prominent interstitial lung markings bilaterally and diffusely could represent chronic lung disease however infection or interstitial edema possible. Electronically Signed   By: Marlan Palau M.D.   On: 10/05/2020 10:37    Cardiac Studies   CT  scan chest Question of nonocclusive right basal subsegmental and left lower lobe segmental and subsegmental filling defects though unable to exclude artifact Enlargement of main pulmonary artery concerning for pulmonary arterial hypertension Bibasilar atelectasis  Patient Profile     87 y.o. female history of severe aortic stenosis presenting with a fall, found to have left hip fracture status post surgical repair.  Assessment & Plan    1.  Severe AS Almost critical AS, details discussed with her Previously reluctant to have surgery, now with complications delaying her valve treatment -Not a good candidate at this time, would push for quick rehab And expedited work-up of her valve including right and left heart catheterization and referral to TAVR clinic ASAP = We will defer to Dr. Mady Gemma to make arrangements, primary cardiologist.  He has seen her this admission  2.  CAD/inferior MI status post PCI. Normal ejection fraction, she denies any angina We will continue aspirin, statin  3.  Left hip fracture s/p repair -Management per surgical team Also with left upper extremity pain from fall  4.  Hypotension Likely secondary to pain medications Currently not on any antihypertensives that could be contributing to low pressure For symptomatic hypotension may need to add low-dose midodrine   [facility] will sign off.   Medication Recommendations:   Other recommendations (labs, testing, etc): No further testing at this time Follow up as an outpatient: Follow-up with Dr. Kirke Corin for expedited TAVR eval once she has completed rehab   Total encounter time more than 35 minutes  Greater than 50% was spent in counseling and coordination of care with the patient   Signed, Dossie Arbour, MD, Ph.D [facility]   For questions or updates, please contact [facility] Please consult [email or web address] for contact info under        Signed, Julien Nordmann, MD  10/05/2020, 4:12  PM

## 2020-10-05 NOTE — Progress Notes (Signed)
PT Cancellation Note  Patient Details Name: Monica [name] MRN: [id number] DOB: [birth year]   Cancelled Treatment:     PT attempt. MD at bedside. Will return later this AM and continue to follow pt per POC.   Rushie Chestnut 10/05/2020, 11:12 AM

## 2020-10-05 NOTE — Progress Notes (Addendum)
PROGRESS NOTE    Monica [name]   IPJ:[id number]  DOB: 04-21-1934  PCP: Patient, No Pcp Per    DOA: 10/02/2020 LOS: 3   Brief Narrative   87 y.o. WF PMHx aortic stenosis, CAD, dyslipidemia, obesity who presented to the ED on 10/03/20 via EMS after a mechanical fall at home resulting in a LEFT proximal femur fracture.  Patient reported the fall occurred when she lost her balance walking around her bed to turn up the thermostat, falling into a dresser and landing on her left side.    Admitted to the hospitalist service with orthopedics consulted.  Patient underwent surgery on afternoon of 2/22, with intramedullary nailing of the LEFT femur.  Patient evaluated by PT and OT postoperatively, and SNF for short-term rehab was recommended after discharge.  TOC is following and placement is underway.    Assessment & Plan   Principal Problem:   Closed comminuted intertrochanteric fracture of proximal end of left femur (HCC) Active Problems:   Aortic stenosis   Obesity   CAD (coronary artery disease)   Severe aortic stenosis   Left hip fracture -status post surgical repair on 2/22.  --PT and OT recommend SNF, TOC following for placement --Lovenox, foot pumps, TED hose for DVT prophylaxis --Toe-touch weightbearing on the left lower extremity --Follow-up in [facility] Orth[facility] clinic 2 weeks for staple removal and x-ray --Analgesics as needed --Bowel regimen  Acute respiratory failure with hypoxia - has been requiring supplemental oxygen of 4-6 L/min in postop setting.   D-dimer elevated.  High risk for PE in setting of postop hip repair, immobility. --Titrate O2 for sats > 88%, wean as tolerated --CTA chest is pending to evaluate for PE --Incentive spirometry  Severe aortic stenosis -cardiology consulted.  Blood pressures low/normal. --Coreg stopped --Outpatient follow-up recommended for TAVR evaluation  Coronary artery disease / History of inferior MI status post PCI -stable.  EF  preserved. --Continue aspirin and statin --Beta-blocker has been stopped as above   Obesity: Body mass index is 39.94 kg/m.  Complicates overall care and prognosis.  DVT prophylaxis: enoxaparin (LOVENOX) injection 40 mg Start: 10/04/20 0800 SCDs Start: 10/03/20 1908   Diet:  Diet Orders (From admission, onward)    Start     Ordered   10/03/20 1908  Diet regular Room service appropriate? Yes; Fluid consistency: Thin  Diet effective now       Question Answer Comment  Room service appropriate? Yes   Fluid consistency: Thin      10/03/20 1907            Code Status: Full Code    Subjective 10/05/20    Patient is postop day 2 from repair of her left hip fracture.  She is still on 4 L of oxygen, does not use any at home.  She denies any cough, shortness of breath, fevers or chills, chest pain with inspiration or other complaints.  Pain fairly well controlled.   Disposition Plan & Communication   Status is: Inpatient  Remains inpatient appropriate because:Inpatient level of care appropriate due to severity of illness.  Patient continues require supplemental oxygen, evaluation of this is underway.  Weaning oxygen.   Dispo: The patient is from: Home              Anticipated d/c is to: SNF              Anticipated d/c date is: 2 days  Patient currently is not medically stable to d/c.   Difficult to place patient No  Family Communication: Daughter at bedside on rounds today   Consults, Procedures, Significant Events   Consultants:   Orthopedics  Cardiology  Procedures:   IM nail fixation of left intertrochanteric femur fracture 2/22  Antimicrobials:  Anti-infectives (From admission, onward)   Start     Dose/Rate Route Frequency Ordered Stop   10/03/20 2000  ceFAZolin (ANCEF) IVPB 2g/100 mL premix        2 g 200 mL/hr over 30 Minutes Intravenous Every 6 hours 10/03/20 1907 10/04/20 1124        Micro    Objective   Vitals:   10/04/20 2333  10/05/20 0434 10/05/20 0802 10/05/20 1136  BP: 114/69 111/68 115/68 (!) 97/59  Pulse: 91 89 90 90  Resp: 18 18 19 18   Temp: 98.5 F (36.9 C) 98 F (36.7 C) 98.6 F (37 C) 98.4 F (36.9 C)  TempSrc:      SpO2: 99% 97% 97% 98%  Weight:      Height:        Intake/Output Summary (Last 24 hours) at 10/05/2020 1434 Last data filed at 10/05/2020 1341 Gross per 24 hour  Intake 600 ml  Output 1300 ml  Net -700 ml   Filed Weights   10/02/20 0859  Weight: 108.9 kg    Physical Exam:  General exam: awake, alert, no acute distress, obese Respiratory system: CTAB, no wheezes, rales or rhonchi, normal respiratory effort, on 4 L/min nasal cannula oxygen. Cardiovascular system: normal S1/S2, RRR, no pedal edema.   Central nervous system: A&O x3. no gross focal neurologic deficits, normal speech Extremities: moves all,, no cyanosis, normal tone Psychiatry: normal mood, congruent affect, judgement and insight appear normal  Labs   Data Reviewed: I have personally reviewed following labs and imaging studies  CBC: Recent Labs  Lab 10/02/20 0907 10/04/20 0423 10/05/20 0441  WBC 10.9* 12.3* 10.4  NEUTROABS 8.1* 10.3* 6.8  HGB 13.9 9.8* 8.5*  HCT 43.9 31.0* 25.8*  MCV 87.3 88.8 86.9  PLT 196 145* 145*   Basic Metabolic Panel: Recent Labs  Lab 10/02/20 0907 10/04/20 0423 10/05/20 0441  NA 136 132* 134*  K 4.2 4.7 4.8  CL 104 102 102  CO2 26 25 27   GLUCOSE 118* 129* 120*  BUN 19 19 26*  CREATININE 0.87 0.91 0.88  CALCIUM 8.7* 7.9* 7.9*  MG  --  1.9 2.2  PHOS  --  3.2 2.8   GFR: Estimated Creatinine Clearance: 58.4 mL/min (by C-G formula based on SCr of 0.88 mg/dL). Liver Function Tests: Recent Labs  Lab 10/04/20 0423 10/05/20 0441  AST 39 43*  ALT 12 9  ALKPHOS 46 42  BILITOT 1.5* 1.6*  PROT 5.9* 5.7*  ALBUMIN 2.6* 2.6*   No results for input(s): LIPASE, AMYLASE in the last 168 hours. No results for input(s): AMMONIA in the last 168 hours. Coagulation  Profile: Recent Labs  Lab 10/02/20 1153  INR 1.1   Cardiac Enzymes: No results for input(s): CKTOTAL, CKMB, CKMBINDEX, TROPONINI in the last 168 hours. BNP (last 3 results) No results for input(s): PROBNP in the last 8760 hours. HbA1C: No results for input(s): HGBA1C in the last 72 hours. CBG: No results for input(s): GLUCAP in the last 168 hours. Lipid Profile: No results for input(s): CHOL, HDL, LDLCALC, TRIG, CHOLHDL, LDLDIRECT in the last 72 hours. Thyroid Function Tests: No results for input(s): TSH, T4TOTAL, FREET4, T3FREE, THYROIDAB  in the last 72 hours. Anemia Panel: No results for input(s): VITAMINB12, FOLATE, FERRITIN, TIBC, IRON, RETICCTPCT in the last 72 hours. Sepsis Labs: Recent Labs  Lab 10/05/20 0441  PROCALCITON 0.26    Recent Results (from the past 240 hour(s))  Resp Panel by RT-PCR (Flu A&B, Covid) Nasopharyngeal Swab     Status: None   Collection Time: 10/02/20  9:07 AM   Specimen: Nasopharyngeal Swab; Nasopharyngeal(NP) swabs in vial transport medium  Result Value Ref Range Status   SARS Coronavirus 2 by RT PCR NEGATIVE NEGATIVE Final    Comment: (NOTE) SARS-CoV-2 target nucleic acids are NOT DETECTED.  The SARS-CoV-2 RNA is generally detectable in upper respiratory specimens during the acute phase of infection. The lowest concentration of SARS-CoV-2 viral copies this assay can detect is 138 copies/mL. A negative result does not preclude SARS-Cov-2 infection and should not be used as the sole basis for treatment or other patient management decisions. A negative result may occur with  improper specimen collection/handling, submission of specimen other than nasopharyngeal swab, presence of viral mutation(s) within the areas targeted by this assay, and inadequate number of viral copies(<138 copies/mL). A negative result must be combined with clinical observations, patient history, and epidemiological information. The expected result is  Negative.  Fact Sheet for Patients:  BloggerCourse.com  Fact Sheet for Healthcare Providers:  SeriousBroker.it  This test is no t yet approved or cleared by the [place] FDA and  has been authorized for detection and/or diagnosis of SARS-CoV-2 by FDA under an Emergency Use Authorization (EUA). This EUA will remain  in effect (meaning this test can be used) for the duration of the COVID-19 declaration under Section 564(b)(1) of the Act, 21 U.S.C.section 360bbb-3(b)(1), unless the authorization is terminated  or revoked sooner.       Influenza A by PCR NEGATIVE NEGATIVE Final   Influenza B by PCR NEGATIVE NEGATIVE Final    Comment: (NOTE) The Xpert Xpress SARS-CoV-2/FLU/RSV plus assay is intended as an aid in the diagnosis of influenza from Nasopharyngeal swab specimens and should not be used as a sole basis for treatment. Nasal washings and aspirates are unacceptable for Xpert Xpress SARS-CoV-2/FLU/RSV testing.  Fact Sheet for Patients: BloggerCourse.com  Fact Sheet for Healthcare Providers: SeriousBroker.it  This test is not yet approved or cleared by the [place] FDA and has been authorized for detection and/or diagnosis of SARS-CoV-2 by FDA under an Emergency Use Authorization (EUA). This EUA will remain in effect (meaning this test can be used) for the duration of the COVID-19 declaration under Section 564(b)(1) of the Act, 21 U.S.C. section 360bbb-3(b)(1), unless the authorization is terminated or revoked.  Performed at [facility], [address] [place], [place] [address]       Imaging Studies   DG Chest [place] 1 View  Result Date: 10/05/2020 CLINICAL DATA:  Acute respiratory failure EXAM: PORTABLE CHEST 1 VIEW COMPARISON:  10/02/2020 FINDINGS: Cardiac enlargement without heart failure. Prominent lung markings diffusely unchanged. No focal  consolidation or effusion. Chronic right rib fractures. IMPRESSION: No interval change. Prominent interstitial lung markings bilaterally and diffusely could represent chronic lung disease however infection or interstitial edema possible. Electronically Signed   By: Marlan Palau M.D.   On: 10/05/2020 10:37   DG HIP OPERATIVE UNILAT W OR W/O PELVIS LEFT  Result Date: 10/03/2020 CLINICAL DATA:  Intramedullary nail on the LEFT. EXAM: OPERATIVE LEFT HIP (WITH PELVIS IF PERFORMED)  VIEWS TECHNIQUE: Fluoroscopic spot image(s) were submitted for interpretation post-operatively. COMPARISON:  October 02, 2020 FINDINGS: Six intraoperative fluoroscopic views with changes of intramedullary rod placement and screw fixation across the femoral neck spanning a comminuted intertrochanteric fracture of the LEFT proximal femur. Midportion of the device is not imaged. Distal interlocking screws of been placed in the distal femur within the metadiaphyseal region. Proximally there is a compression type screw in a cannulated lag screw in place across the femoral neck. Persistent distraction of the lesser trochanteric fragment. IMPRESSION: Post ORIF of LEFT intertrochanteric fracture with intramedullary nailing and fixation devices as described. Midportion of the devices are not imaged.  Attention on follow-up. Electronically Signed   By: Donzetta KohutGeoffrey  Wile M.D.   On: 10/03/2020 22:21     Medications   Scheduled Meds: . acetaminophen  1,000 mg Oral Q8H  . aspirin EC  81 mg Oral Daily  . Chlorhexidine Gluconate Cloth  6 each Topical Daily  . docusate sodium  100 mg Oral BID  . enoxaparin (LOVENOX) injection  40 mg Subcutaneous Q24H  . feeding supplement  237 mL Oral BID BM  . multivitamin with minerals  1 tablet Oral Daily  . polyethylene glycol  17 g Oral Daily  . rosuvastatin  10 mg Oral Daily  . senna-docusate  1 tablet Oral BID   Continuous Infusions: . methocarbamol (ROBAXIN) IV         LOS: 3 days    Time  spent: 30 minutes with greater than 30% spent in coordination of care and at bedside    Pennie BanterKelly A Timoteo Carreiro, DO [facility]  10/05/2020, 2:34 PM      If 7PM-7AM, please contact night-coverage. How to contact the [facility] Attending or Consulting provider 7A - 7P or covering provider during after hours 7P -7A, for this patient?    1. Check the care team in [facility] and look for a) attending/consulting [facility] provider listed and b) the [facility] team listed 2. Log into [email or web address] and use 's universal password to access. If you do not have the password, please contact the hospital operator. 3. Locate the [facility] provider you are looking for under [facility] and page to a number that you can be directly reached. 4. If you still have difficulty reaching the provider, please page the [facility] (Director on Call) for the Hospitalists listed on amion for assistance.

## 2020-10-05 NOTE — Plan of Care (Signed)

## 2020-10-05 NOTE — Progress Notes (Signed)
Physical Therapy Treatment Patient Details Name: Monica [name] MRN: [id number] DOB: 1934/04/04 Today's Date: 10/05/2020    History of Present Illness 87 y.o. female with a history of prior MI with stent placement in 2018, aortic stenosis, and GI bleed who had a fall.  Found to have L intertrochanteric hip fx with subsequent IM nailing ORIF 2/22.    PT Comments    Pt was in semifowlers position upon arriving. She is alert and conversational throughout session with supportive daughter at bedside. MD ordered imaging to rule out PE however did give verbal clearance for OOB to chair. Elected to focus on strengthening this session and will progress to OOB activity once PE ruled out. HEP handout issued and pt performed. Tolerated well but does require assistance to complete. See exercises performed list below. Pt did c/o slight L arm/shoulder/armpit pain. Acute PT will continue to follow and progress as able per POC. She was repositioned in bed with bed alarm in place, call bell in reach and daughter present at conclusion of session.    Follow Up Recommendations  SNF     Equipment Recommendations  Other (comment) (defer to next level of care)    Recommendations for Other Services       Precautions / Restrictions Precautions Precautions: Fall Restrictions Weight Bearing Restrictions: Yes LLE Weight Bearing: Touchdown weight bearing    Mobility  Bed Mobility    General bed mobility comments: pt did not get OOB this session. did issue HEP handout and pt performed with assistance           Cognition Arousal/Alertness: Awake/alert Behavior During Therapy: WFL for tasks assessed/performed Overall Cognitive Status: Within Functional Limits for tasks assessed        General Comments: Pt was alert throughout session and able to follow simple one step commands well. MD states she ordered imaging to rule out PE. Did give ok to assist pt OOB to recliner however therapist elected to  perform bed level ther ex this AM session and will attempt OOB activity once PE ruled out      Exercises General Exercises - Lower Extremity Ankle Circles/Pumps: AROM;10 reps Quad Sets: AROM;10 reps Gluteal Sets: AROM;10 reps Heel Slides: AAROM;10 reps Hip ABduction/ADduction: AAROM;10 reps Straight Leg Raises: AAROM;10 reps        Pertinent Vitals/Pain Pain Assessment: 0-10 Pain Score: 4  Pain Location: L hip Pain Descriptors / Indicators: Operative site guarding;Tender Pain Intervention(s): Limited activity within patient's tolerance;Monitored during session           PT Goals (current goals can now be found in the care plan section) Acute Rehab PT Goals Patient Stated Goal: get better so I can go home eventually Progress towards PT goals: Progressing toward goals    Frequency    BID      PT Plan Current plan remains appropriate       AM-PAC PT "6 Clicks" Mobility   Outcome Measure  Help needed turning from your back to your side while in a flat bed without using bedrails?: A Lot Help needed moving from lying on your back to sitting on the side of a flat bed without using bedrails?: A Lot Help needed moving to and from a bed to a chair (including a wheelchair)?: Total Help needed standing up from a chair using your arms (e.g., wheelchair or bedside chair)?: Total Help needed to walk in hospital room?: Total Help needed climbing 3-5 steps with a railing? : Total 6 Click Score: 8  End of Session Equipment Utilized During Treatment: Oxygen Activity Tolerance: Patient tolerated treatment well Patient left: with bed alarm set;with call bell/phone within reach;with family/visitor present;in bed Nurse Communication: Mobility status PT Visit Diagnosis: Muscle weakness (generalized) (M62.81);Difficulty in walking, not elsewhere classified (R26.2);Pain Pain - Right/Left: Left Pain - part of body: Hip     Time: 1140-1158 PT Time Calculation (min) (ACUTE ONLY):  18 min  Charges:  $Therapeutic Exercise: 8-22 mins                     Jetta Lout PTA 10/05/20, 12:59 PM

## 2020-10-05 NOTE — Progress Notes (Signed)
°  Subjective: 2 Days Post-Op Procedure(s) (LRB): INTRAMEDULLARY (IM) NAIL INTERTROCHANTRIC (Left) Patient reports pain as moderate.  Resting better. Patient is well, and has had no acute complaints or problems Plan is to go Rehab after hospital stay. Negative for chest pain and shortness of breath Fever: no Gastrointestinal: Negative for nausea and vomiting  Objective: Vital signs in last 24 hours: Temp:  [97.8 F (36.6 C)-98.6 F (37 C)] 98 F (36.7 C) (02/24 0434) Pulse Rate:  [88-97] 89 (02/24 0434) Resp:  [16-18] 18 (02/24 0434) BP: (102-115)/(57-69) 111/68 (02/24 0434) SpO2:  [95 %-99 %] 97 % (02/24 0434)  Intake/Output from previous day:  Intake/Output Summary (Last 24 hours) at 10/05/2020 0707 Last data filed at 10/05/2020 0500 Gross per 24 hour  Intake 940 ml  Output 700 ml  Net 240 ml    Intake/Output this shift: No intake/output data recorded.  Labs: Recent Labs    10/02/20 0907 10/04/20 0423 10/05/20 0441  HGB 13.9 9.8* 8.5*   Recent Labs    10/04/20 0423 10/05/20 0441  WBC 12.3* 10.4  RBC 3.49* 2.97*  HCT 31.0* 25.8*  PLT 145* 145*   Recent Labs    10/04/20 0423 10/05/20 0441  NA 132* 134*  K 4.7 4.8  CL 102 102  CO2 25 27  BUN 19 26*  CREATININE 0.91 0.88  GLUCOSE 129* 120*  CALCIUM 7.9* 7.9*   Recent Labs    10/02/20 1153  INR 1.1     EXAM General - Patient is Alert and Oriented Extremity - Neurovascular intact Sensation intact distally Dorsiflexion/Plantar flexion intact Compartment soft Dressing/Incision - clean, dry, no drainage Motor Function - intact, moving foot and toes well on exam.   Past Medical History:  Diagnosis Date   Aortic stenosis    a. LHC 06/02/17: At least moderate aortic stenosis with a peak to peak gradient of 22 mmHg; b. TTE 05/2017: EF 55-60%, mild HK basal-midinferior wall, Gr1DD, mod to sev AS w/ mean gradient 21 mmHg, valve area 0.99, mild MR, mildly dilated LA    CAD (coronary artery disease)  2018   a. inferior STEMI 06/02/2017: LHC 06/02/17: LM 20, D1 20%, o-pLCx 50, p-mRCA 100% s/p PCI/DES, mRCA 50, dRCA 30   GI bleed    a. noted 06/05/2017   HLD (hyperlipidemia)    Obesity     Assessment/Plan: 2 Days Post-Op Procedure(s) (LRB): INTRAMEDULLARY (IM) NAIL INTERTROCHANTRIC (Left) Principal Problem:   Closed comminuted intertrochanteric fracture of proximal end of left femur (HCC) Active Problems:   Aortic stenosis   Obesity   CAD (coronary artery disease)   Severe aortic stenosis  Estimated body mass index is 39.94 kg/m as calculated from the following:   Height as of this encounter: 5\' 5"  (1.651 m).   Weight as of this encounter: 108.9 kg. Advance diet Up with therapy D/C IV fluids   Follow-up at [facility] clinic orthopedics in 2 weeks for staple removal and x-rays of the left femur  DVT Prophylaxis - Lovenox, Foot Pumps and TED hose Toe-touch Weight-Bearing to left leg  [facility], PA-C Orthopaedic Surgery 10/05/2020, 7:07 AM

## 2020-10-06 ENCOUNTER — Inpatient Hospital Stay: Payer: Medicare Other

## 2020-10-06 LAB — COMPREHENSIVE METABOLIC PANEL
ALT: 9 U/L (ref 0–44)
AST: 38 U/L (ref 15–41)
Albumin: 2.5 g/dL — ABNORMAL LOW (ref 3.5–5.0)
Alkaline Phosphatase: 46 U/L (ref 38–126)
Anion gap: 5 (ref 5–15)
BUN: 20 mg/dL (ref 8–23)
CO2: 28 mmol/L (ref 22–32)
Calcium: 8.2 mg/dL — ABNORMAL LOW (ref 8.9–10.3)
Chloride: 101 mmol/L (ref 98–111)
Creatinine, Ser: 0.75 mg/dL (ref 0.44–1.00)
GFR, Estimated: >60 mL/min (ref >60)
Glucose, Bld: 118 mg/dL — ABNORMAL HIGH (ref 70–99)
Potassium: 4.9 mmol/L (ref 3.5–5.1)
Sodium: 134 mmol/L — ABNORMAL LOW (ref 135–145)
Total Bilirubin: 1.9 mg/dL — ABNORMAL HIGH (ref 0.3–1.2)
Total Protein: 6 g/dL — ABNORMAL LOW (ref 6.5–8.1)

## 2020-10-06 LAB — CBC WITH DIFFERENTIAL/PLATELET
Abs Immature Granulocytes: 0.03 10*3/uL (ref 0.00–0.07)
Basophils Absolute: 0.1 10*3/uL (ref 0.0–0.1)
Basophils Relative: 1 %
Eosinophils Absolute: 0.7 10*3/uL — ABNORMAL HIGH (ref 0.0–0.5)
Eosinophils Relative: 6 %
HCT: 25.1 % — ABNORMAL LOW (ref 36.0–46.0)
Hemoglobin: 7.8 g/dL — ABNORMAL LOW (ref 12.0–15.0)
Immature Granulocytes: 0 %
Lymphocytes Relative: 24 %
Lymphs Abs: 2.7 10*3/uL (ref 0.7–4.0)
MCH: 27.6 pg (ref 26.0–34.0)
MCHC: 31.1 g/dL (ref 30.0–36.0)
MCV: 88.7 fL (ref 80.0–100.0)
Monocytes Absolute: 1.5 10*3/uL — ABNORMAL HIGH (ref 0.1–1.0)
Monocytes Relative: 13 %
Neutro Abs: 6.3 10*3/uL (ref 1.7–7.7)
Neutrophils Relative %: 56 %
Platelets: 163 10*3/uL (ref 150–400)
RBC: 2.83 MIL/uL — ABNORMAL LOW (ref 3.87–5.11)
RDW: 14.6 % (ref 11.5–15.5)
WBC: 11.3 10*3/uL — ABNORMAL HIGH (ref 4.0–10.5)
nRBC: 0 % (ref 0.0–0.2)

## 2020-10-06 LAB — HEMOGLOBIN AND HEMATOCRIT, BLOOD
HCT: 26.5 % — ABNORMAL LOW (ref 36.0–46.0)
Hemoglobin: 8.2 g/dL — ABNORMAL LOW (ref 12.0–15.0)

## 2020-10-06 LAB — PROCALCITONIN: Procalcitonin: 0.15 ng/mL

## 2020-10-06 MED ORDER — PANTOPRAZOLE SODIUM 40 MG IV SOLR
40.0000 mg | Freq: Two times a day (BID) | INTRAVENOUS | Status: DC
  Administered 2020-10-06 – 2020-10-08 (×5): 40 mg via INTRAVENOUS
  Filled 2020-10-06 (×5): qty 40

## 2020-10-06 MED ORDER — TRAZODONE HCL 50 MG PO TABS
50.0000 mg | ORAL_TABLET | Freq: Every day | ORAL | Status: DC
  Administered 2020-10-06 – 2020-10-08 (×3): 50 mg via ORAL
  Filled 2020-10-06 (×3): qty 1

## 2020-10-06 NOTE — Progress Notes (Signed)
Physical Therapy Treatment Patient Details Name: Monica [name] MRN: [id number] DOB: [birth year] Today's Date: 10/06/2020    History of Present Illness 87 y.o. female with a history of prior MI with stent placement in 2018, aortic stenosis, and GI bleed who had a fall.  Found to have L intertrochanteric hip fx with subsequent IM nailing ORIF 2/22.    PT Comments    Pt was sitting in recliner upon arriving and has been up since earlier session. She agrees to PT session requesting" Get my back into bed. I'm exhausted." Pt endorses pain in L hip however did not limit session progression. Attempted standing from lower recliner height with +2 assist however pt unable. Author questions pt's effort/participation. Reports she is too tired. She required  dependent transfer back to bed via lateral slide using draw pad. Pt did perform there ex listed below and tolerated well. Acute PT will continue to follow and progress as able per POC. She will greatly benefit from SNF at DC to address deficits while improving Independence with ADLs. At conclusion of session, pt was in bed with call bell in reach and RN aware of pt's abilities. Recommend Hoyer lift if pt is to get OOB. Pt unable to adhere to proper TTWD and is at high fall risk with all OOB activity. Will decrease frequency to 1 x a day due to pt's limited progress and limitations with wt bearing restrictions.     Follow Up Recommendations  SNF     Equipment Recommendations  Other (comment) (defer to next level of care)    Recommendations for Other Services       Precautions / Restrictions Precautions Precautions: Fall Precaution Comments: incontinent Restrictions Weight Bearing Restrictions: Yes LLE Weight Bearing: Touchdown weight bearing    Mobility  Bed Mobility Overal bed mobility: Needs Assistance Bed Mobility: Sit to Supine     Supine to sit: Mod assist;Max assist;+2 for physical assistance;+2 for safety/equipment;HOB elevated Sit  to supine: Max assist;+2 for safety/equipment   General bed mobility comments: pt required max assist of one to return to supine from EOB short sit    Transfers Overall transfer level: Needs assistance Equipment used: Rolling walker (2 wheeled) Transfers: Sit to/from Stand Sit to Stand: +2 safety/equipment;+2 physical assistance;Total assist Stand pivot transfers: Max assist;Total assist;+2 physical assistance;+2 safety/equipment       General transfer comment: Pt attempted standing from low recliner height surface however was unable.Author question's pt's participation/effort. Depedent total assist transfer performed to return pt to bed. She  Ambulation/Gait    General Gait Details: unable/unsafe at this time      Balance Overall balance assessment: Needs assistance Sitting-balance support: Feet supported Sitting balance-Leahy Scale: Good Sitting balance - Comments: no LOB seated EOB   Standing balance support: Bilateral upper extremity supported;During functional activity Standing balance-Leahy Scale: Zero Standing balance comment: requires 2p support as well as BUE support to sustain static stand for <10 seconds.         Cognition Arousal/Alertness: Awake/alert Behavior During Therapy: WFL for tasks assessed/performed;Anxious Overall Cognitive Status: Within Functional Limits for tasks assessed      General Comments: Pt is A and oriented. requesting to return to bed." I'm pooped."      Exercises General Exercises - Lower Extremity Ankle Circles/Pumps: AROM;10 reps Quad Sets: AROM;10 reps Gluteal Sets: AROM;10 reps Short Arc Quad: AAROM;AROM;10 reps Hip ABduction/ADduction: AAROM;10 reps Straight Leg Raises: AAROM;10 reps Other Exercises Other Exercises: OT spends extended time education pt re: importance of OOB  Activity, in addition, OT engages pt in FWD punches for 2 sets x10 reps and straight arm raises  for 2 sets x10 reps (L with less ROM than R following  this most recent fall d/t pain/swelling). In addiiton, OT educates pt re: modified seated tricep pushes to increase strength to offset WB restrictions to L LE when using RW for ADL transfers. Pt and dtr with good reception of education.    General Comments General comments (skin integrity, edema, etc.): reviewed importance of continued performance of HEP for strengthening      Pertinent Vitals/Pain Pain Assessment: 0-10 Pain Score: 7  Pain Location: L hip Pain Descriptors / Indicators: Operative site guarding;Tender Pain Intervention(s): Limited activity within patient's tolerance;Monitored during session;Premedicated before session;Repositioned;Ice applied           PT Goals (current goals can now be found in the care plan section) Acute Rehab PT Goals Patient Stated Goal: get better so I can go home eventually Progress towards PT goals: Progressing toward goals    Frequency    BID      PT Plan Current plan remains appropriate    Co-evaluation   Reason for Co-Treatment: Complexity of the patient's impairments (multi-system involvement) PT goals addressed during session: Mobility/safety with mobility;Balance;Strengthening/ROM OT goals addressed during session: ADL's and self-care;Proper use of Adaptive equipment and DME      AM-PAC PT "6 Clicks" Mobility   Outcome Measure  Help needed turning from your back to your side while in a flat bed without using bedrails?: A Lot Help needed moving from lying on your back to sitting on the side of a flat bed without using bedrails?: A Lot Help needed moving to and from a bed to a chair (including a wheelchair)?: Total Help needed standing up from a chair using your arms (e.g., wheelchair or bedside chair)?: Total Help needed to walk in hospital room?: Total Help needed climbing 3-5 steps with a railing? : Total 6 Click Score: 8    End of Session Equipment Utilized During Treatment: Oxygen (5 L o2 throughout) Activity  Tolerance: Patient tolerated treatment well Patient left: in bed;with call bell/phone within reach;with bed alarm set Nurse Communication: Mobility status PT Visit Diagnosis: Muscle weakness (generalized) (M62.81);Difficulty in walking, not elsewhere classified (R26.2);Pain Pain - Right/Left: Left Pain - part of body: Hip     Time: 6237-6283 PT Time Calculation (min) (ACUTE ONLY): 23 min  Charges:  $Therapeutic Activity: 23-37 mins                     Jetta Lout PTA 10/06/20, 3:32 PM

## 2020-10-06 NOTE — Progress Notes (Signed)
Occupational Therapy Treatment Patient Details Name: Monica [name] MRN: [id number] DOB: Jan 05, 1934 Today's Date: 10/06/2020    History of present illness 87 y.o. female with a history of prior MI with stent placement in 2018, aortic stenosis, and GI bleed who had a fall.  Found to have L intertrochanteric hip fx with subsequent IM nailing ORIF 2/22.   OT comments  Pt seen for OT treatment with PT co-treat this date to f/u re: safety with ADLs/ADL mobility. Pt presenting with appearance of anxiety related to mobilization OOB this date. Pt requires MAX effort/encouragement from PT/OT team to engage in transfer trials. Pt requires MOD/MAX A as well as ed re: modified technique for LB dressing. Next, PT/OT engage pt in sit to stand from elevated surface with MOD/MAX +2 with RW. Pt tolerates ~10 second stand and demos poor eccentric control for returning to sitting. On second trial from lower surface, pt requires MAX A +2. OT provides cues for safe hand placement with use of 2WW and PTA cues pt for WB precaution adherence both verbally and tacitly/manually with his foot placed under pt's L LE. Pt then assisted with transfer to chair with drop-arm feature utilized, OT positioned behind pt to assist with concentric control and pivoting hips toward chair (on her right) while PTA positioned in front of pt assisting with knee blocking and WB cues. In addition, towel roll used under pt's R foot to create a "wedge"-like feature as pt with limited dorsiflexion to R ankle at baseline (h/o old injury from her teen years). In chair, OT engages pt in below-listed exrercises as well as education regarding rationale. Pt and dtr with good understanding. Will continue to follow. Pt left in chair with all needs met and in reach, following up with PTA regarding LB exercises. Continue to anticipate that pt will require STR f/u upon d/c from acute setting to ensure strength, safety and confidence to perform self care tasks and ADL  transfers.    Follow Up Recommendations  SNF    Equipment Recommendations  3 in 1 bedside commode;Tub/shower seat    Recommendations for Other Services      Precautions / Restrictions Precautions Precautions: Fall Precaution Comments: incontinent Restrictions Weight Bearing Restrictions: Yes LLE Weight Bearing: Touchdown weight bearing       Mobility Bed Mobility Overal bed mobility: Needs Assistance Bed Mobility: Supine to Sit;Sit to Supine     Supine to sit: Mod assist;Max assist;+2 for physical assistance;+2 for safety/equipment;HOB elevated Sit to supine:  (pt in chair post session)   General bed mobility comments: pt required assistance with progressing to [facility] short sit. HHA + use of draw pad    Transfers Overall transfer level: Needs assistance Equipment used: Rolling walker (2 wheeled) Transfers: Sit to/from [employer] Transfers Sit to Stand: +2 safety/equipment;+2 physical assistance;Max assist;From elevated surface Stand pivot transfers: Max assist;Total assist;+2 physical assistance;+2 safety/equipment       General transfer comment: increased time and effort, cues for hand placement from OT with use of RW, cues for WB adherance from PT. Pt with difficulty adhering to L LE PWB, in part-d/t already decreased BOS on R foot as pt with ankle fixed in slight plantar flexion d/t h/o old injury with plates to her ankle in the 1950s.    Balance Overall balance assessment: Needs assistance Sitting-balance support: Feet supported Sitting balance-Leahy Scale: Good Sitting balance - Comments: no LOB seated EOB   Standing balance support: Bilateral upper extremity supported;During functional activity Standing balance-Leahy Scale: Zero  Standing balance comment: requires 2p support as well as BUE support to sustain static stand for <10 seconds.                           ADL either performed or assessed with clinical judgement   ADL Overall ADL's :  Needs assistance/impaired                                       General ADL Comments: Pt requires MOD/MAX A To don socks seated EOB this date, demos difficulty with hip flexion, pt attributes this to swelling. Pt able to use external hip rotation, seated position to pull R LE up to bed level and contribute to donning sock although she still requires assist to thread. OT does not educate re: AE use for LB ADLs this date as remainder of time spent on theract and therex     Vision Patient Visual Report: No change from baseline     Perception     Praxis      Cognition Arousal/Alertness: Awake/alert Behavior During Therapy: WFL for tasks assessed/performed;Anxious Overall Cognitive Status: Within Functional Limits for tasks assessed                                 General Comments: Pt somewhat anxious, requires MAX effort/encouragement from PT/OT To engage pt in transfer trials. Eventually pt agreeable.        Exercises Other Exercises Other Exercises: OT spends extended time education pt re: importance of OOB Activity, in addition, OT engages pt in FWD punches for 2 sets x10 reps and straight arm raises  for 2 sets x10 reps (L with less ROM than R following this most recent fall d/t pain/swelling). In addiiton, OT educates pt re: modified seated tricep pushes to increase strength to offset WB restrictions to L LE when using RW for ADL transfers. Pt and dtr with good reception of education.   Shoulder Instructions       General Comments reviewed importance of continued performance of HEP for strengthening    Pertinent Vitals/ Pain       Pain Assessment: 0-10 Pain Score: 4  Pain Location: L hip Pain Descriptors / Indicators: Operative site guarding;Tender Pain Intervention(s): Limited activity within patient's tolerance;Monitored during session;Repositioned  Home Living                                          Prior  Functioning/Environment              Frequency  Min 2X/week        Progress Toward Goals  OT Goals(current goals can now be found in the care plan section)  Progress towards OT goals: Progressing toward goals  Acute Rehab OT Goals Patient Stated Goal: get better so I can go home eventually OT Goal Formulation: With patient Time For Goal Achievement: 10/18/20 Potential to Achieve Goals: [place] Discharge plan remains appropriate;Frequency needs to be updated    Co-evaluation    PT/OT/SLP Co-Evaluation/Treatment: Yes Reason for Co-Treatment: Complexity of the patient's impairments (multi-system involvement) PT goals addressed during session: Mobility/safety with mobility;Balance OT goals addressed during session: ADL's and self-care;Proper use of Adaptive equipment and  DME      AM-PAC OT "6 Clicks" Daily Activity     Outcome Measure   Help from another person eating meals?: None Help from another person taking care of personal grooming?: A Little Help from another person toileting, which includes using toliet, bedpan, or urinal?: A Lot Help from another person bathing (including washing, rinsing, drying)?: A Lot Help from another person to put on and taking off regular upper body clothing?: A Little Help from another person to put on and taking off regular lower body clothing?: A Lot 6 Click Score: 16    End of Session Equipment Utilized During Treatment: Gait belt;Rolling walker  OT Visit Diagnosis: Unsteadiness on feet (R26.81);Muscle weakness (generalized) (M62.81);Pain Pain - Right/Left: Left Pain - part of body: Leg;Hip   Activity Tolerance Patient tolerated treatment well;Patient limited by pain;Patient limited by fatigue   Patient Left with call bell/phone within reach;in chair;with chair alarm set   Nurse Communication Mobility status        Time: 0539-7673 OT Time Calculation (min): 28 min  Charges: OT General Charges $OT Visit: 1 Visit OT  Treatments $Self Care/Home Management : 8-22 mins  Gerrianne Scale, [place], OTR/L ascom [phone] 10/06/20, 3:03 PM

## 2020-10-06 NOTE — Progress Notes (Signed)
Physical Therapy Treatment Patient Details Name: Monica [name] MRN: [id number] DOB: February 17, 1934 Today's Date: 10/06/2020    History of Present Illness 87 y.o. female with a history of prior MI with stent placement in 2018, aortic stenosis, and GI bleed who had a fall.  Found to have L intertrochanteric hip fx with subsequent IM nailing ORIF 2/22.    PT Comments    PT/OT co treat due to safety/assistance required for mobility/transfers. Pt resistive to session at first but with encouragement agrees to attempt OOB activity. Pt requires +2 assistance to safely exit bed and stand 2 x. She struggles adhering to proper wt bearing restrictions. +3 assist for safety to stand pivot to recliner. Was on 4 L o2 upon arrival but did require increase to 5 L to maintain > 88%. HR stable throughout. Will return for PM session to assist pt with returning to bed. RN in room at conclusion of session.     Follow Up Recommendations  SNF     Equipment Recommendations  Other (comment) (defer to next level of care)    Recommendations for Other Services       Precautions / Restrictions Precautions Precautions: Fall Precaution Comments: incontinent Restrictions Weight Bearing Restrictions: Yes LLE Weight Bearing: Touchdown weight bearing    Mobility  Bed Mobility Overal bed mobility: Needs Assistance Bed Mobility: Supine to Sit;Sit to Supine     Supine to sit: Mod assist;Max assist;+2 for physical assistance;+2 for safety/equipment;HOB elevated Sit to supine:  (pt in chair post session)   General bed mobility comments: pt required assistance with progressing to [facility] short sit. HHA + use of draw pad    Transfers Overall transfer level: Needs assistance Equipment used: Rolling walker (2 wheeled) Transfers: Sit to/from Stand Sit to Stand: +2 safety/equipment;+2 physical assistance;Max assist;From elevated surface         General transfer comment: Pt was able to stand 2 x EOB prior to stand  pivot to recliner. +2 assist for standing EOB but +3 for safety to stand pivot to recliner. Pt has poor AROM in R ankle and struggles maintaining proper wt bearing.  Ambulation/Gait      General Gait Details: unsafe/unable to safely perform      Balance Overall balance assessment: Needs assistance Sitting-balance support: Feet supported Sitting balance-Leahy Scale: Good Sitting balance - Comments: no LOB seated EOB   Standing balance support: Bilateral upper extremity supported;During functional activity Standing balance-Leahy Scale: Zero Standing balance comment: unable to static stand with BUE support + 2 max assist.       Cognition Arousal/Alertness: Awake/alert Behavior During Therapy: WFL for tasks assessed/performed Overall Cognitive Status: Within Functional Limits for tasks assessed        General Comments: Pt needs encouragement to participate in PT/OT co-treat. Eventually agrees and is cooperative. Pt aware of TTWB however struggles to adhere during transfers/standing activity. Pt has limited R ankle ROM and tends to stand on toes.         General Comments General comments (skin integrity, edema, etc.): reviewed importance of continued performance of HEP for strengthening      Pertinent Vitals/Pain Pain Assessment: 0-10 Pain Location: L hip Pain Descriptors / Indicators: Operative site guarding;Tender Pain Intervention(s): Limited activity within patient's tolerance;Monitored during session;Premedicated before session           PT Goals (current goals can now be found in the care plan section) Acute Rehab PT Goals Patient Stated Goal: get better so I can go home eventually Progress towards  PT goals: Progressing toward goals    Frequency    BID      PT Plan Current plan remains appropriate    Co-evaluation   Reason for Co-Treatment: Complexity of the patient's impairments (multi-system involvement);For patient/therapist safety;To address  functional/ADL transfers          AM-PAC PT "6 Clicks" Mobility   Outcome Measure  Help needed turning from your back to your side while in a flat bed without using bedrails?: A Lot Help needed moving from lying on your back to sitting on the side of a flat bed without using bedrails?: A Lot Help needed moving to and from a bed to a chair (including a wheelchair)?: Total Help needed standing up from a chair using your arms (e.g., wheelchair or bedside chair)?: Total Help needed to walk in hospital room?: Total Help needed climbing 3-5 steps with a railing? : Total 6 Click Score: 8    End of Session Equipment Utilized During Treatment: Oxygen;Other (comment) (on 4 L upon arriving but required increase to 5 L to maintain >88%) Activity Tolerance: Patient tolerated treatment well Patient left: in chair;with call bell/phone within reach;with chair alarm set;with family/visitor present;with nursing/sitter in room Nurse Communication: Mobility status PT Visit Diagnosis: Muscle weakness (generalized) (M62.81);Difficulty in walking, not elsewhere classified (R26.2);Pain Pain - Right/Left: Left Pain - part of body: Hip     Time: 1030-1104 PT Time Calculation (min) (ACUTE ONLY): 34 min  Charges:  $Therapeutic Activity: 8-22 mins                     Jetta Lout PTA 10/06/20, 12:05 PM

## 2020-10-06 NOTE — Plan of Care (Signed)

## 2020-10-06 NOTE — Care Management Important Message (Signed)
Important Message  Patient Details  Name: Monica [name] MRN: [id number] Date of Birth: 1933/12/10   Medicare Important Message Given:  Yes     [name] A [name] 10/06/2020, 1:52 PM

## 2020-10-06 NOTE — Progress Notes (Signed)
  Subjective: 3 Days Post-Op Procedure(s) (LRB): INTRAMEDULLARY (IM) NAIL INTERTROCHANTRIC (Left) Patient reports pain as moderate.  Resting better. Patient is well, and has had no acute complaints or problems Plan is to go Rehab after hospital stay. Negative for chest pain and shortness of breath Fever: no Gastrointestinal: Negative for nausea and vomiting  Objective: Vital signs in last 24 hours: Temp:  [98.1 F (36.7 C)-98.6 F (37 C)] 98.2 F (36.8 C) (02/25 0431) Pulse Rate:  [90-102] 99 (02/25 0431) Resp:  [18-19] 18 (02/25 0431) BP: (97-129)/(54-82) 129/82 (02/25 0431) SpO2:  [96 %-98 %] 96 % (02/25 0431)  Intake/Output from previous day:  Intake/Output Summary (Last 24 hours) at 10/06/2020 0621 Last data filed at 10/06/2020 0530 Gross per 24 hour  Intake 360 ml  Output 1800 ml  Net -1440 ml    Intake/Output this shift: Total I/O In: -  Out: 750 [Urine:750]  Labs: Recent Labs    10/04/20 0423 10/05/20 0441 10/06/20 0434  HGB 9.8* 8.5* 7.8*   Recent Labs    10/05/20 0441 10/06/20 0434  WBC 10.4 11.3*  RBC 2.97* 2.83*  HCT 25.8* 25.1*  PLT 145* 163   Recent Labs    10/05/20 0441 10/06/20 0434  NA 134* 134*  K 4.8 4.9  CL 102 101  CO2 27 28  BUN 26* 20  CREATININE 0.88 0.75  GLUCOSE 120* 118*  CALCIUM 7.9* 8.2*   No results for input(s): LABPT, INR in the last 72 hours.   EXAM General - Patient is Alert and Oriented Extremity - Neurovascular intact Sensation intact distally Dorsiflexion/Plantar flexion intact Compartment soft Dressing/Incision - clean, dry, no drainage.  Change this morning. Motor Function - intact, moving foot and toes well on exam.   Past Medical History:  Diagnosis Date  . Aortic stenosis    a. LHC 06/02/17: At least moderate aortic stenosis with a peak to peak gradient of 22 mmHg; b. TTE 05/2017: EF 55-60%, mild HK basal-midinferior wall, Gr1DD, mod to sev AS w/ mean gradient 21 mmHg, valve area 0.99, mild MR,  mildly dilated LA   . CAD (coronary artery disease) 2018   a. inferior STEMI 06/02/2017: LHC 06/02/17: LM 20, D1 20%, o-pLCx 50, p-mRCA 100% s/p PCI/DES, mRCA 50, dRCA 30  . GI bleed    a. noted 06/05/2017  . HLD (hyperlipidemia)   . Obesity     Assessment/Plan: 3 Days Post-Op Procedure(s) (LRB): INTRAMEDULLARY (IM) NAIL INTERTROCHANTRIC (Left) Principal Problem:   Closed comminuted intertrochanteric fracture of proximal end of left femur (HCC) Active Problems:   Aortic stenosis   Obesity   CAD (coronary artery disease)   Severe aortic stenosis   Acute respiratory failure with hypoxia (HCC)  Estimated body mass index is 39.94 kg/m as calculated from the following:   Height as of this encounter: 5\' 5"  (1.651 m).   Weight as of this encounter: 108.9 kg. Advance diet Up with therapy D/C IV fluids   Follow-up at [facility] clinic orthopedics in 2 weeks for staple removal and x-rays of the left femur  Acute blood loss anemia.  Hemoglobin 7.8.  Possible transfusion per medicine.  DVT Prophylaxis - Lovenox, Foot Pumps and TED hose Toe-touch Weight-Bearing to left leg  [facility], PA-C Orthopaedic Surgery 10/06/2020, 6:21 AM

## 2020-10-06 NOTE — TOC Progression Note (Addendum)
Transition of Care [facility]) - Progression Note    Patient Details  Name: Monica [name] MRN: [id number] Date of Birth: [birth year]  [facility] CM/SW Contact  Liliana Cline, LCSW Phone Number: 10/06/2020, 1:51 PM  Clinical Narrative:   Left VM for patient's daughter Cecilie Lowers requesting a return call regarding SNF preferences and with patient's SS# so that PASRR screening can be done.  3:18- Call to [facility] again. She reported they reviewed medicare.gov website and their top SNF would be [facility]. Called Tammy with [facility] to accept bed offer. Per MD, possible DC Sunday or Monday at the earliest. Tammy reported weekend [facility] can call her over the weekend if patient is ready for DC.  Added SS# to chart. Started PASRR screening. Started English as a second language teacher. Updated MD. Left handoff for Weekend TOC.  Expected Discharge Plan: Skilled Nursing Facility Barriers to Discharge: Continued Medical Work up  Expected Discharge Plan and Services Expected Discharge Plan: Skilled Nursing Facility     Post Acute Care Choice:  (TBD) Living arrangements for the past 2 months: Single Family Home                                       Social Determinants of Health (SDOH) Interventions    Readmission Risk Interventions No flowsheet data found.

## 2020-10-06 NOTE — Progress Notes (Signed)
PROGRESS NOTE    Monica [name]   WUJ:[id number]  DOB: [birth year]  PCP: Patient, No Pcp Per    DOA: 10/02/2020 LOS: 4   Brief Narrative   87 y.o. WF PMHx aortic stenosis, CAD, dyslipidemia, obesity who presented to the ED on 10/03/20 via EMS after a mechanical fall at home resulting in a LEFT proximal femur fracture.  Patient reported the fall occurred when she lost her balance walking around her bed to turn up the thermostat, falling into a dresser and landing on her left side.    Admitted to the hospitalist service with orthopedics consulted.  Patient underwent surgery on afternoon of 2/22, with intramedullary nailing of the LEFT femur.  Patient evaluated by PT and OT postoperatively, and SNF for short-term rehab was recommended after discharge.  TOC is following and placement is underway.    Assessment & Plan   Principal Problem:   Closed comminuted intertrochanteric fracture of proximal end of left femur (HCC) Active Problems:   Acute respiratory failure with hypoxia (HCC)   Aortic stenosis   Obesity   CAD (coronary artery disease)   Severe aortic stenosis   Left hip fracture -status post surgical repair on 2/22.  --PT and OT recommend SNF, TOC following for placement --Lovenox, foot pumps, TED hose for DVT prophylaxis --Toe-touch weightbearing on the left lower extremity --Follow-up in [facility] clinic 2 weeks for staple removal and x-ray --Analgesics as needed --Bowel regimen  Acute respiratory failure with hypoxia - has been requiring supplemental oxygen of 4-6 L/min in postop setting.   D-dimer elevated.  High risk for PE in setting of postop hip repair, immobility. --Titrate O2 for sats > 88%, wean as tolerated --CTA chest is pending to evaluate for PE --Incentive spirometry  Severe aortic stenosis -cardiology consulted.  Blood pressures low/normal. --Coreg stopped --Outpatient follow-up recommended for TAVR evaluation  Coronary artery disease / History of  inferior MI status post PCI -stable.  EF preserved. --Continue aspirin and statin --Beta-blocker has been stopped as above   Obesity: Body mass index is 39.94 kg/m.  Complicates overall care and prognosis.  DVT prophylaxis: enoxaparin (LOVENOX) injection 40 mg Start: 10/04/20 0800 SCDs Start: 10/03/20 1908   Diet:  Diet Orders (From admission, onward)    Start     Ordered   10/03/20 1908  Diet regular Room service appropriate? Yes; Fluid consistency: Thin  Diet effective now       Question Answer Comment  Room service appropriate? Yes   Fluid consistency: Thin      10/03/20 1907            Code Status: Full Code    Subjective 10/06/20    Patient is postop day 2 from repair of her left hip fracture.  She is still on 4 L of oxygen, does not use any at home.  She denies any cough, shortness of breath, fevers or chills, chest pain with inspiration or other complaints.  Pain fairly well controlled.   Disposition Plan & Communication   Status is: Inpatient  Remains inpatient appropriate because:Inpatient level of care appropriate due to severity of illness.  Patient continues require supplemental oxygen, evaluation of this is underway.  Weaning oxygen.   Dispo: The patient is from: Home              Anticipated d/c is to: SNF              Anticipated d/c date is: 2 days  Patient currently is not medically stable to d/c.   Difficult to place patient No  Family Communication: Daughter at bedside on rounds today   Consults, Procedures, Significant Events   Consultants:   Orthopedics  Cardiology  Procedures:   IM nail fixation of left intertrochanteric femur fracture 2/22  Antimicrobials:  Anti-infectives (From admission, onward)   Start     Dose/Rate Route Frequency Ordered Stop   10/03/20 2000  ceFAZolin (ANCEF) IVPB 2g/100 mL premix        2 g 200 mL/hr over 30 Minutes Intravenous Every 6 hours 10/03/20 1907 10/04/20 1124        Micro     Objective   Vitals:   10/05/20 1950 10/06/20 0138 10/06/20 0431 10/06/20 0929  BP: (!) 108/56 114/74 129/82 118/66  Pulse: (!) 102 96 99 94  Resp: 18 18 18 18   Temp: 98.1 F (36.7 C) 98.2 F (36.8 C) 98.2 F (36.8 C) 98 F (36.7 C)  TempSrc:      SpO2: 98% 96% 96% 100%  Weight:      Height:        Intake/Output Summary (Last 24 hours) at 10/06/2020 1753 Last data filed at 10/06/2020 1404 Gross per 24 hour  Intake 240 ml  Output 1350 ml  Net -1110 ml   Filed Weights   10/02/20 0859  Weight: 108.9 kg    Physical Exam:  General exam: awake, alert, no acute distress, obese Respiratory system: CTAB, no wheezes, rales or rhonchi, normal respiratory effort, on 4 L/min nasal cannula oxygen. Cardiovascular system: normal S1/S2, RRR, no pedal edema.   Central nervous system: A&O x3. no gross focal neurologic deficits, normal speech Extremities: moves all,, no cyanosis, normal tone Psychiatry: normal mood, congruent affect, judgement and insight appear normal  Labs   Data Reviewed: I have personally reviewed following labs and imaging studies  CBC: Recent Labs  Lab 10/02/20 0907 10/04/20 0423 10/05/20 0441 10/06/20 0434 10/06/20 1249  WBC 10.9* 12.3* 10.4 11.3*  --   NEUTROABS 8.1* 10.3* 6.8 6.3  --   HGB 13.9 9.8* 8.5* 7.8* 8.2*  HCT 43.9 31.0* 25.8* 25.1* 26.5*  MCV 87.3 88.8 86.9 88.7  --   PLT 196 145* 145* 163  --    Basic Metabolic Panel: Recent Labs  Lab 10/02/20 0907 10/04/20 0423 10/05/20 0441 10/06/20 0434  NA 136 132* 134* 134*  K 4.2 4.7 4.8 4.9  CL 104 102 102 101  CO2 26 25 27 28   GLUCOSE 118* 129* 120* 118*  BUN 19 19 26* 20  CREATININE 0.87 0.91 0.88 0.75  CALCIUM 8.7* 7.9* 7.9* 8.2*  MG  --  1.9 2.2  --   PHOS  --  3.2 2.8  --    GFR: Estimated Creatinine Clearance: 64.3 mL/min (by C-G formula based on SCr of 0.75 mg/dL). Liver Function Tests: Recent Labs  Lab 10/04/20 0423 10/05/20 0441 10/06/20 0434  AST 39 43* 38  ALT 12  9 9   ALKPHOS 46 42 46  BILITOT 1.5* 1.6* 1.9*  PROT 5.9* 5.7* 6.0*  ALBUMIN 2.6* 2.6* 2.5*   No results for input(s): LIPASE, AMYLASE in the last 168 hours. No results for input(s): AMMONIA in the last 168 hours. Coagulation Profile: Recent Labs  Lab 10/02/20 1153  INR 1.1   Cardiac Enzymes: No results for input(s): CKTOTAL, CKMB, CKMBINDEX, TROPONINI in the last 168 hours. BNP (last 3 results) No results for input(s): PROBNP in the last 8760 hours. HbA1C: No  results for input(s): HGBA1C in the last 72 hours. CBG: No results for input(s): GLUCAP in the last 168 hours. Lipid Profile: No results for input(s): CHOL, HDL, LDLCALC, TRIG, CHOLHDL, LDLDIRECT in the last 72 hours. Thyroid Function Tests: No results for input(s): TSH, T4TOTAL, FREET4, T3FREE, THYROIDAB in the last 72 hours. Anemia Panel: No results for input(s): VITAMINB12, FOLATE, FERRITIN, TIBC, IRON, RETICCTPCT in the last 72 hours. Sepsis Labs: Recent Labs  Lab 10/05/20 0441 10/06/20 0434  PROCALCITON 0.26 0.15    Recent Results (from the past 240 hour(s))  Resp Panel by RT-PCR (Flu A&B, Covid) Nasopharyngeal Swab     Status: None   Collection Time: 10/02/20  9:07 AM   Specimen: Nasopharyngeal Swab; Nasopharyngeal(NP) swabs in vial transport medium  Result Value Ref Range Status   SARS Coronavirus 2 by RT PCR NEGATIVE NEGATIVE Final    Comment: (NOTE) SARS-CoV-2 target nucleic acids are NOT DETECTED.  The SARS-CoV-2 RNA is generally detectable in upper respiratory specimens during the acute phase of infection. The lowest concentration of SARS-CoV-2 viral copies this assay can detect is 138 copies/mL. A negative result does not preclude SARS-Cov-2 infection and should not be used as the sole basis for treatment or other patient management decisions. A negative result may occur with  improper specimen collection/handling, submission of specimen other than nasopharyngeal swab, presence of viral mutation(s)  within the areas targeted by this assay, and inadequate number of viral copies(<138 copies/mL). A negative result must be combined with clinical observations, patient history, and epidemiological information. The expected result is Negative.  Fact Sheet for Patients:  BloggerCourse.com  Fact Sheet for Healthcare Providers:  SeriousBroker.it  This test is no t yet approved or cleared by the [place] FDA and  has been authorized for detection and/or diagnosis of SARS-CoV-2 by FDA under an Emergency Use Authorization (EUA). This EUA will remain  in effect (meaning this test can be used) for the duration of the COVID-19 declaration under Section 564(b)(1) of the Act, 21 U.S.C.section 360bbb-3(b)(1), unless the authorization is terminated  or revoked sooner.       Influenza A by PCR NEGATIVE NEGATIVE Final   Influenza B by PCR NEGATIVE NEGATIVE Final    Comment: (NOTE) The Xpert Xpress SARS-CoV-2/FLU/RSV plus assay is intended as an aid in the diagnosis of influenza from Nasopharyngeal swab specimens and should not be used as a sole basis for treatment. Nasal washings and aspirates are unacceptable for Xpert Xpress SARS-CoV-2/FLU/RSV testing.  Fact Sheet for Patients: BloggerCourse.com  Fact Sheet for Healthcare Providers: SeriousBroker.it  This test is not yet approved or cleared by the [place] FDA and has been authorized for detection and/or diagnosis of SARS-CoV-2 by FDA under an Emergency Use Authorization (EUA). This EUA will remain in effect (meaning this test can be used) for the duration of the COVID-19 declaration under Section 564(b)(1) of the Act, 21 U.S.C. section 360bbb-3(b)(1), unless the authorization is terminated or revoked.  Performed at [facility], [address] [place], [place] [address]       Imaging Studies   CT ANGIO CHEST PE  W OR WO CONTRAST  Result Date: 10/05/2020 CLINICAL DATA:  Elevated D-dimer, recent surgery EXAM: CT ANGIOGRAPHY CHEST WITH CONTRAST TECHNIQUE: Multidetector CT imaging of the chest was performed using the standard protocol during bolus administration of intravenous contrast. Multiplanar CT image reconstructions and MIPs were obtained to evaluate the vascular anatomy. CONTRAST:  74mL OMNIPAQUE IOHEXOL 350 MG/ML SOLN COMPARISON:  None. FINDINGS: Cardiovascular:  Satisfactory opacification of the pulmonary arteries to the proximal segmental level. However, there is respiratory motion. Question of nonocclusive right basal subsegmental and left lower lobe segmental and subsegmental filling defects. Enlargement of the main pulmonary artery measuring 3.7 cm suggesting pulmonary arterial hypertension. Cardiomegaly. Coronary artery calcification. No pericardial effusion. Thoracic aorta is normal in caliber with mixed plaque. Mediastinum/Nodes: Mediastinal adenopathy is present. A prevascular node on image 26 of series 4 measures 1.7 cm in short axis. Subcarinal node on image 42 also measures 2 cm in short axis. Nonenlarged hilar nodes. Small hiatal hernia. Thyroid is unremarkable. Lungs/Pleura: Imaging in expiration. Interstitial thickening and patchy ground-glass density. Bibasilar atelectasis. Upper Abdomen: No acute abnormality.  Cholelithiasis. Musculoskeletal: Degenerative changes of the spine. No acute osseous abnormality. Review of the MIP images confirms the above findings. IMPRESSION: No definite pulmonary embolism. Question of nonocclusive right basal subsegmental and left lower lobe segmental and subsegmental filling defects. There is significant respiratory motion and these could be artifactual. Bibasilar atelectasis. Interstitial thickening and patchy ground-glass density, which could reflect additional atelectasis (imaging in expiration), mild edema, or less likely atypical pneumonia. Mediastinal adenopathy.  Cardiomegaly. Enlargement of the main pulmonary artery suggesting pulmonary arterial hypertension. Electronically Signed   By: Guadlupe Spanish M.D.   On: 10/05/2020 15:06   US Venous Img Lower Bilateral (DVT)  Result Date: 10/06/2020 CLINICAL DATA:  Hypoxia, acute respiratory failure EXAM: BILATERAL LOWER EXTREMITY VENOUS DOPPLER ULTRASOUND TECHNIQUE: Gray-scale sonography with compression, as well as color and duplex ultrasound, were performed to evaluate the deep venous system(s) from the level of the common femoral vein through the popliteal and proximal calf veins. COMPARISON:  04/29/2018 FINDINGS: VENOUS Normal compressibility of the common femoral, superficial femoral, and popliteal veins, as well as the visualized calf veins. Visualized portions of profunda femoral vein and great saphenous vein unremarkable. No filling defects to suggest DVT on grayscale or color Doppler imaging. Doppler waveforms show normal direction of venous flow, normal respiratory phasicity and response to augmentation. Limited views of the contralateral common femoral vein are unremarkable. OTHER None. Limitations: Technologist describes technically difficult study secondary to swelling and pain in the region of the left femoral vein. IMPRESSION: No femoropopliteal DVT nor evidence of DVT within the visualized calf veins. If clinical symptoms are inconsistent or if there are persistent or worsening symptoms, further imaging (possibly involving the iliac veins) may be warranted. Electronically Signed   By: Corlis Leak M.D.   On: 10/06/2020 16:27   DG Chest Port 1 View  Result Date: 10/05/2020 CLINICAL DATA:  Acute respiratory failure EXAM: PORTABLE CHEST 1 VIEW COMPARISON:  10/02/2020 FINDINGS: Cardiac enlargement without heart failure. Prominent lung markings diffusely unchanged. No focal consolidation or effusion. Chronic right rib fractures. IMPRESSION: No interval change. Prominent interstitial lung markings bilaterally and  diffusely could represent chronic lung disease however infection or interstitial edema possible. Electronically Signed   By: Marlan Palau M.D.   On: 10/05/2020 10:37     Medications   Scheduled Meds: . acetaminophen  1,000 mg Oral Q8H  . aspirin EC  81 mg Oral Daily  . Chlorhexidine Gluconate Cloth  6 each Topical Daily  . docusate sodium  100 mg Oral BID  . enoxaparin (LOVENOX) injection  40 mg Subcutaneous Q24H  . feeding supplement  237 mL Oral BID BM  . multivitamin with minerals  1 tablet Oral Daily  . pantoprazole (PROTONIX) IV  40 mg Intravenous Q12H  . polyethylene glycol  17 g Oral Daily  .  rosuvastatin  10 mg Oral Daily  . senna-docusate  1 tablet Oral BID  . traZODone  50 mg Oral QHS   Continuous Infusions: . methocarbamol (ROBAXIN) IV         LOS: 4 days    Time spent: 30 minutes with greater than 30% spent in coordination of care and at bedside    Pennie Banter, DO [facility]  10/06/2020, 5:53 PM      If 7PM-7AM, please contact night-coverage. How to contact the [facility] Attending or Consulting provider 7A - 7P or covering provider during after hours 7P -7A, for this patient?    1. Check the care team in [facility] and look for a) attending/consulting [facility] provider listed and b) the [facility] team listed 2. Log into [email or web address] and use [place]'s universal password to access. If you do not have the password, please contact the hospital operator. 3. Locate the [facility] provider you are looking for under [facility] and page to a number that you can be directly reached. 4. If you still have difficulty reaching the provider, please page the [facility] (Director on Call) for the Hospitalists listed on amion for assistance.  PROGRESS NOTE    Monica [name]   WUJ:[id number]  DOB: 08-25-1933  PCP: Patient, No Pcp Per    DOA: 10/02/2020 LOS: 4   Brief Narrative   87 y.o. WF PMHx aortic stenosis, CAD, dyslipidemia, obesity who presented to the ED on 10/03/20  via EMS after a mechanical fall at home resulting in a LEFT proximal femur fracture.  Patient reported the fall occurred when she lost her balance walking around her bed to turn up the thermostat, falling into a dresser and landing on her left side.    Admitted to the hospitalist service with orthopedics consulted.  Patient underwent surgery on afternoon of 2/22, with intramedullary nailing of the LEFT femur.  Patient evaluated by PT and OT postoperatively, and SNF for short-term rehab was recommended after discharge.  TOC is following and placement is underway.    Assessment & Plan   Principal Problem:   Closed comminuted intertrochanteric fracture of proximal end of left femur (HCC) Active Problems:   Acute respiratory failure with hypoxia (HCC)   Aortic stenosis   Obesity   CAD (coronary artery disease)   Severe aortic stenosis   Left hip fracture -status post surgical repair on 2/22.  --PT and OT recommend SNF, TOC following for placement --Lovenox, foot pumps, TED hose for DVT prophylaxis --Toe-touch weightbearing on the left lower extremity --Follow-up in [facility] clinic 2 weeks for staple removal and x-ray --Analgesics as needed --Bowel regimen  Acute respiratory failure with hypoxia - has been requiring supplemental oxygen of 4-6 L/min in postop setting.   D-dimer elevated.  High risk for PE in setting of postop hip repair, immobility.   CTA chest on 2/24 had a lot of artifact, no definitive PE, did show enlarged main pulmonary artery consistent with pulmonary HTN --Lower extremity dopplers pending (tomorrow per pt request) --Pulmonology consulted, appreciate recs --Titrate O2 for sats > 88%, wean as tolerated --Incentive spirometry  Acute blood loss anemia - Hbg has trended down over past few days.  Hbg this AM is 7.8 (was 13.9 pre-op).  No evidence of bleeding. --Follow H&H / CBC --Transfuse pRBCs if Hbg < 7.0 --Low threshold to stop Lovenox, but high risk for VTE so  continue with caution for now --Anemia panel with AM labs --Consider iron infusion --IV Protonix BID for now  Severe aortic stenosis -cardiology consulted.  Blood pressures low/normal. --Coreg stopped --Outpatient follow-up recommended for TAVR evaluation  Coronary artery disease / History of inferior MI status post PCI -stable.  EF preserved. --Continue aspirin and statin --Beta-blocker has been stopped as above   Obesity: Body mass index is 39.94 kg/m.  Complicates overall care and prognosis.  DVT prophylaxis: enoxaparin (LOVENOX) injection 40 mg Start: 10/04/20 0800 SCDs Start: 10/03/20 1908   Diet:  Diet Orders (From admission, onward)    Start     Ordered   10/03/20 1908  Diet regular Room service appropriate? Yes; Fluid consistency: Thin  Diet effective now       Question Answer Comment  Room service appropriate? Yes   Fluid consistency: Thin      10/03/20 1907            Code Status: Full Code    Subjective 10/06/20    Patient up in chair when seen.  Reports not feeling well this afternoon, feels "all over", groggy.  Felt good earlier in the day.  Suspects due to some medication not sure which one. Pain controlled fairly well.  Denies SOB, cough, CP.  Reports baseline DOE.   Disposition Plan & Communication   Status is: Inpatient  Remains inpatient appropriate because:Inpatient level of care appropriate due to severity of illness.  Patient continues require supplemental oxygen, evaluation of this is underway.  Weaning oxygen.  Pulmonology consulted.  Also has worsening acute anemia.   Dispo: The patient is from: Home              Anticipated d/c is to: SNF              Anticipated d/c date is: 2-3 days              Patient currently is not medically stable to d/c.   Difficult to place patient No  Family Communication: Daughter at bedside on rounds today   Consults, Procedures, Significant Events   Consultants:    Orthopedics  Cardiology  Procedures:   IM nail fixation of left intertrochanteric femur fracture 2/22  Antimicrobials:  Anti-infectives (From admission, onward)   Start     Dose/Rate Route Frequency Ordered Stop   10/03/20 2000  ceFAZolin (ANCEF) IVPB 2g/100 mL premix        2 g 200 mL/hr over 30 Minutes Intravenous Every 6 hours 10/03/20 1907 10/04/20 1124        Micro    Objective   Vitals:   10/05/20 1950 10/06/20 0138 10/06/20 0431 10/06/20 0929  BP: (!) 108/56 114/74 129/82 118/66  Pulse: (!) 102 96 99 94  Resp: Temp: 98.1 F (36.7 C) 98.2 F (36.8 C) 98.2 F (36.8 C) 98 F (36.7 C)  TempSrc:      SpO2: 98% 96% 96% 100%  Weight:      Height:        Intake/Output Summary (Last 24 hours) at 10/06/2020 1753 Last data filed at 10/06/2020 1404 Gross per 24 hour  Intake 240 ml  Output 1350 ml  Net -1110 ml   Filed Weights   10/02/20 0859  Weight: 108.9 kg    Physical Exam:  General exam: up in chair, awake, alert, no acute distress, obese Respiratory system: CTAB, diminished bases, no wheezes, normal respiratory effort, on 4 L/min nasal cannula oxygen. Cardiovascular system: normal S1/S2, RRR, no edema   Central nervous system: A&O x3. no gross focal neurologic deficits, normal  speech Extremities: shins tender on palpation, no erythema warmth or rash seen, moves all, normal tone Psychiatry: normal mood, congruent affect, judgement and insight appear normal  Labs   Data Reviewed: I have personally reviewed following labs and imaging studies  CBC: Recent Labs  Lab 10/02/20 0907 10/04/20 0423 10/05/20 0441 10/06/20 0434 10/06/20 1249  WBC 10.9* 12.3* 10.4 11.3*  --   NEUTROABS 8.1* 10.3* 6.8 6.3  --   HGB 13.9 9.8* 8.5* 7.8* 8.2*  HCT 43.9 31.0* 25.8* 25.1* 26.5*  MCV 87.3 88.8 86.9 88.7  --   PLT 196 145* 145* 163  --    Basic Metabolic Panel: Recent Labs  Lab 10/02/20 0907 10/04/20 0423 10/05/20 0441 10/06/20 0434  NA  136 132* 134* 134*  K 4.2 4.7 4.8 4.9  CL 104 102 102 101  CO2 26 25 27 28   GLUCOSE 118* 129* 120* 118*  BUN 19 19 26* 20  CREATININE 0.87 0.91 0.88 0.75  CALCIUM 8.7* 7.9* 7.9* 8.2*  MG  --  1.9 2.2  --   PHOS  --  3.2 2.8  --    GFR: Estimated Creatinine Clearance: 64.3 mL/min (by C-G formula based on SCr of 0.75 mg/dL). Liver Function Tests: Recent Labs  Lab 10/04/20 0423 10/05/20 0441 10/06/20 0434  AST 39 43* 38  ALT 12 9 9   ALKPHOS 46 42 46  BILITOT 1.5* 1.6* 1.9*  PROT 5.9* 5.7* 6.0*  ALBUMIN 2.6* 2.6* 2.5*   No results for input(s): LIPASE, AMYLASE in the last 168 hours. No results for input(s): AMMONIA in the last 168 hours. Coagulation Profile: Recent Labs  Lab 10/02/20 1153  INR 1.1   Cardiac Enzymes: No results for input(s): CKTOTAL, CKMB, CKMBINDEX, TROPONINI in the last 168 hours. BNP (last 3 results) No results for input(s): PROBNP in the last 8760 hours. HbA1C: No results for input(s): HGBA1C in the last 72 hours. CBG: No results for input(s): GLUCAP in the last 168 hours. Lipid Profile: No results for input(s): CHOL, HDL, LDLCALC, TRIG, CHOLHDL, LDLDIRECT in the last 72 hours. Thyroid Function Tests: No results for input(s): TSH, T4TOTAL, FREET4, T3FREE, THYROIDAB in the last 72 hours. Anemia Panel: No results for input(s): VITAMINB12, FOLATE, FERRITIN, TIBC, IRON, RETICCTPCT in the last 72 hours. Sepsis Labs: Recent Labs  Lab 10/05/20 0441 10/06/20 0434  PROCALCITON 0.26 0.15    Recent Results (from the past 240 hour(s))  Resp Panel by RT-PCR (Flu A&B, Covid) Nasopharyngeal Swab     Status: None   Collection Time: 10/02/20  9:07 AM   Specimen: Nasopharyngeal Swab; Nasopharyngeal(NP) swabs in vial transport medium  Result Value Ref Range Status   SARS Coronavirus 2 by RT PCR NEGATIVE NEGATIVE Final    Comment: (NOTE) SARS-CoV-2 target nucleic acids are NOT DETECTED.  The SARS-CoV-2 RNA is generally detectable in upper  respiratory specimens during the acute phase of infection. The lowest concentration of SARS-CoV-2 viral copies this assay can detect is 138 copies/mL. A negative result does not preclude SARS-Cov-2 infection and should not be used as the sole basis for treatment or other patient management decisions. A negative result may occur with  improper specimen collection/handling, submission of specimen other than nasopharyngeal swab, presence of viral mutation(s) within the areas targeted by this assay, and inadequate number of viral copies(<138 copies/mL). A negative result must be combined with clinical observations, patient history, and epidemiological information. The expected result is Negative.  Fact Sheet for Patients:  10/08/20  Fact Sheet for  Healthcare Providers:  SeriousBroker.it  This test is no t yet approved or cleared by the [place] and  has been authorized for detection and/or diagnosis of SARS-CoV-2 by FDA under an Emergency Use Authorization (EUA). This EUA will remain  in effect (meaning this test can be used) for the duration of the COVID-19 declaration under Section 564(b)(1) of the Act, 21 U.S.C.section 360bbb-3(b)(1), unless the authorization is terminated  or revoked sooner.       Influenza A by PCR NEGATIVE NEGATIVE Final   Influenza B by PCR NEGATIVE NEGATIVE Final    Comment: (NOTE) The Xpert Xpress SARS-CoV-2/FLU/RSV plus assay is intended as an aid in the diagnosis of influenza from Nasopharyngeal swab specimens and should not be used as a sole basis for treatment. Nasal washings and aspirates are unacceptable for Xpert Xpress SARS-CoV-2/FLU/RSV testing.  Fact Sheet for Patients: BloggerCourse.com  Fact Sheet for Healthcare Providers: SeriousBroker.it  This test is not yet approved or cleared by the [place] FDA and has been  authorized for detection and/or diagnosis of SARS-CoV-2 by FDA under an Emergency Use Authorization (EUA). This EUA will remain in effect (meaning this test can be used) for the duration of the COVID-19 declaration under Section 564(b)(1) of the Act, 21 U.S.C. section 360bbb-3(b)(1), unless the authorization is terminated or revoked.  Performed at [facility], [address] [place], [place] [address]       Imaging Studies   CT ANGIO CHEST PE W OR WO CONTRAST  Result Date: 10/05/2020 CLINICAL DATA:  Elevated D-dimer, recent surgery EXAM: CT ANGIOGRAPHY CHEST WITH CONTRAST TECHNIQUE: Multidetector CT imaging of the chest was performed using the standard protocol during bolus administration of intravenous contrast. Multiplanar CT image reconstructions and MIPs were obtained to evaluate the vascular anatomy. CONTRAST:  75mL OMNIPAQUE IOHEXOL 350 MG/ML SOLN COMPARISON:  None. FINDINGS: Cardiovascular: Satisfactory opacification of the pulmonary arteries to the proximal segmental level. However, there is respiratory motion. Question of nonocclusive right basal subsegmental and left lower lobe segmental and subsegmental filling defects. Enlargement of the main pulmonary artery measuring 3.7 cm suggesting pulmonary arterial hypertension. Cardiomegaly. Coronary artery calcification. No pericardial effusion. Thoracic aorta is normal in caliber with mixed plaque. Mediastinum/Nodes: Mediastinal adenopathy is present. A prevascular node on image 26 of series 4 measures 1.7 cm in short axis. Subcarinal node on image 42 also measures 2 cm in short axis. Nonenlarged hilar nodes. Small hiatal hernia. Thyroid is unremarkable. Lungs/Pleura: Imaging in expiration. Interstitial thickening and patchy ground-glass density. Bibasilar atelectasis. Upper Abdomen: No acute abnormality.  Cholelithiasis. Musculoskeletal: Degenerative changes of the spine. No acute osseous abnormality. Review of the MIP images  confirms the above findings. IMPRESSION: No definite pulmonary embolism. Question of nonocclusive right basal subsegmental and left lower lobe segmental and subsegmental filling defects. There is significant respiratory motion and these could be artifactual. Bibasilar atelectasis. Interstitial thickening and patchy ground-glass density, which could reflect additional atelectasis (imaging in expiration), mild edema, or less likely atypical pneumonia. Mediastinal adenopathy. Cardiomegaly. Enlargement of the main pulmonary artery suggesting pulmonary arterial hypertension. Electronically Signed   By: Guadlupe Spanish M.D.   On: 10/05/2020 15:06   US Venous Img Lower Bilateral (DVT)  Result Date: 10/06/2020 CLINICAL DATA:  Hypoxia, acute respiratory failure EXAM: BILATERAL LOWER EXTREMITY VENOUS DOPPLER ULTRASOUND TECHNIQUE: Gray-scale sonography with compression, as well as color and duplex ultrasound, were performed to evaluate the deep venous system(s) from the level of the common femoral vein through the popliteal and proximal calf  veins. COMPARISON:  04/29/2018 FINDINGS: VENOUS Normal compressibility of the common femoral, superficial femoral, and popliteal veins, as well as the visualized calf veins. Visualized portions of profunda femoral vein and great saphenous vein unremarkable. No filling defects to suggest DVT on grayscale or color Doppler imaging. Doppler waveforms show normal direction of venous flow, normal respiratory phasicity and response to augmentation. Limited views of the contralateral common femoral vein are unremarkable. OTHER None. Limitations: Technologist describes technically difficult study secondary to swelling and pain in the region of the left femoral vein. IMPRESSION: No femoropopliteal DVT nor evidence of DVT within the visualized calf veins. If clinical symptoms are inconsistent or if there are persistent or worsening symptoms, further imaging (possibly involving the iliac veins) may  be warranted. Electronically Signed   By: Corlis Leak M.D.   On: 10/06/2020 16:27   DG Chest Port 1 View  Result Date: 10/05/2020 CLINICAL DATA:  Acute respiratory failure EXAM: PORTABLE CHEST 1 VIEW COMPARISON:  10/02/2020 FINDINGS: Cardiac enlargement without heart failure. Prominent lung markings diffusely unchanged. No focal consolidation or effusion. Chronic right rib fractures. IMPRESSION: No interval change. Prominent interstitial lung markings bilaterally and diffusely could represent chronic lung disease however infection or interstitial edema possible. Electronically Signed   By: Marlan Palau M.D.   On: 10/05/2020 10:37     Medications   Scheduled Meds: . acetaminophen  1,000 mg Oral Q8H  . aspirin EC  81 mg Oral Daily  . Chlorhexidine Gluconate Cloth  6 each Topical Daily  . docusate sodium  100 mg Oral BID  . enoxaparin (LOVENOX) injection  40 mg Subcutaneous Q24H  . feeding supplement  237 mL Oral BID BM  . multivitamin with minerals  1 tablet Oral Daily  . pantoprazole (PROTONIX) IV  40 mg Intravenous Q12H  . polyethylene glycol  17 g Oral Daily  . rosuvastatin  10 mg Oral Daily  . senna-docusate  1 tablet Oral BID  . traZODone  50 mg Oral QHS   Continuous Infusions: . methocarbamol (ROBAXIN) IV         LOS: 4 days    Time spent: 30 minutes with greater than 30% spent in coordination of care and at bedside    Pennie Banter, DO [facility]  10/06/2020, 5:53 PM      If 7PM-7AM, please contact night-coverage. How to contact the [facility] Attending or Consulting provider 7A - 7P or covering provider during after hours 7P -7A, for this patient?    5. Check the care team in [facility] and look for a) attending/consulting [facility] provider listed and b) the [facility] team listed 6. Log into [email or web address] and use [place]'s universal password to access. If you do not have the password, please contact the hospital operator. 7. Locate the [facility] provider you are looking for  under [facility] and page to a number that you can be directly reached. 8. If you still have difficulty reaching the provider, please page the [facility] (Director on Call) for the Hospitalists listed on amion for assistance.

## 2020-10-07 ENCOUNTER — Inpatient Hospital Stay: Payer: Medicare Other

## 2020-10-07 LAB — BLOOD GAS, ARTERIAL
Acid-Base Excess: 12.9 mmol/L — ABNORMAL HIGH (ref 0.0–2.0)
Allens test (pass/fail): POSITIVE — AB
Bicarbonate: 37.4 mmol/L — ABNORMAL HIGH (ref 20.0–28.0)
FIO2: 0.36
O2 Saturation: 95.1 %
Patient temperature: 37
pCO2 arterial: 48 mmHg (ref 32.0–48.0)
pH, Arterial: 7.5 — ABNORMAL HIGH (ref 7.350–7.450)
pO2, Arterial: 69 mmHg — ABNORMAL LOW (ref 83.0–108.0)

## 2020-10-07 LAB — COMPREHENSIVE METABOLIC PANEL
ALT: 17 U/L (ref 0–44)
AST: 42 U/L — ABNORMAL HIGH (ref 15–41)
Albumin: 2.6 g/dL — ABNORMAL LOW (ref 3.5–5.0)
Alkaline Phosphatase: 53 U/L (ref 38–126)
Anion gap: 7 (ref 5–15)
BUN: 16 mg/dL (ref 8–23)
CO2: 30 mmol/L (ref 22–32)
Calcium: 8.4 mg/dL — ABNORMAL LOW (ref 8.9–10.3)
Chloride: 99 mmol/L (ref 98–111)
Creatinine, Ser: 0.68 mg/dL (ref 0.44–1.00)
GFR, Estimated: >60 mL/min (ref >60)
Glucose, Bld: 119 mg/dL — ABNORMAL HIGH (ref 70–99)
Potassium: 4.8 mmol/L (ref 3.5–5.1)
Sodium: 136 mmol/L (ref 135–145)
Total Bilirubin: 2.4 mg/dL — ABNORMAL HIGH (ref 0.3–1.2)
Total Protein: 6.2 g/dL — ABNORMAL LOW (ref 6.5–8.1)

## 2020-10-07 LAB — CBC WITH DIFFERENTIAL/PLATELET
Abs Immature Granulocytes: 0.04 10*3/uL (ref 0.00–0.07)
Basophils Absolute: 0.1 10*3/uL (ref 0.0–0.1)
Basophils Relative: 1 %
Eosinophils Absolute: 0.7 10*3/uL — ABNORMAL HIGH (ref 0.0–0.5)
Eosinophils Relative: 7 %
HCT: 26.8 % — ABNORMAL LOW (ref 36.0–46.0)
Hemoglobin: 8.4 g/dL — ABNORMAL LOW (ref 12.0–15.0)
Immature Granulocytes: 0 %
Lymphocytes Relative: 24 %
Lymphs Abs: 2.6 10*3/uL (ref 0.7–4.0)
MCH: 28 pg (ref 26.0–34.0)
MCHC: 31.3 g/dL (ref 30.0–36.0)
MCV: 89.3 fL (ref 80.0–100.0)
Monocytes Absolute: 1.4 10*3/uL — ABNORMAL HIGH (ref 0.1–1.0)
Monocytes Relative: 13 %
Neutro Abs: 6 10*3/uL (ref 1.7–7.7)
Neutrophils Relative %: 55 %
Platelets: 196 10*3/uL (ref 150–400)
RBC: 3 MIL/uL — ABNORMAL LOW (ref 3.87–5.11)
RDW: 14.8 % (ref 11.5–15.5)
WBC: 10.8 10*3/uL — ABNORMAL HIGH (ref 4.0–10.5)
nRBC: 0 % (ref 0.0–0.2)

## 2020-10-07 LAB — PROCALCITONIN: Procalcitonin: 0.15 ng/mL

## 2020-10-07 LAB — D-DIMER, QUANTITATIVE: D-Dimer, Quant: 4.62 ug/mL-FEU — ABNORMAL HIGH (ref 0.00–0.50)

## 2020-10-07 MED ORDER — ALBUMIN HUMAN 25 % IV SOLN
12.5000 g | Freq: Once | INTRAVENOUS | Status: AC
  Administered 2020-10-07: 12.5 g via INTRAVENOUS
  Filled 2020-10-07: qty 50

## 2020-10-07 MED ORDER — FUROSEMIDE 10 MG/ML IJ SOLN
20.0000 mg | Freq: Two times a day (BID) | INTRAMUSCULAR | Status: DC
  Administered 2020-10-07 – 2020-10-08 (×2): 20 mg via INTRAVENOUS
  Filled 2020-10-07 (×2): qty 4

## 2020-10-07 NOTE — Progress Notes (Signed)
OT Cancellation Note  Patient Details Name: Monica [name] MRN: [id number] DOB: 1934-07-05   Cancelled Treatment:    Reason Eval/Treat Not Completed: Patient at procedure or test/ unavailable  Pt OTF to [place] at this time. Will f/u at later date/time for OT treatment. Thank you.  Rejeana Brock, MS, OTR/L ascom [phone] 10/07/20, 2:52 PM

## 2020-10-07 NOTE — Progress Notes (Signed)
PROGRESS NOTE    Denene Alamillo   ZOX:[id number]  DOB: [birth year]  PCP: Patient, No Pcp Per    DOA: 10/02/2020 LOS: 5   Brief Narrative   87 y.o. WF PMHx aortic stenosis, CAD, dyslipidemia, obesity who presented to the ED on 10/03/20 via EMS after a mechanical fall at home resulting in a LEFT proximal femur fracture.  Patient reported the fall occurred when she lost her balance walking around her bed to turn up the thermostat, falling into a dresser and landing on her left side.    Admitted to the hospitalist service with orthopedics consulted.  Patient underwent surgery on afternoon of 2/22, with intramedullary nailing of the LEFT femur.  Patient evaluated by PT and OT postoperatively, and SNF for short-term rehab was recommended after discharge.  TOC is following and placement is underway.  Admission has been prolonged due to persistent hypoxia.  Patient does not typically require supplemental oxygen, has need ~4 l/min since surgery.  Evaluation underway and pulmonary consulted.    Assessment & Plan   Principal Problem:   Closed comminuted intertrochanteric fracture of proximal end of left femur (HCC) Active Problems:   Acute respiratory failure with hypoxia (HCC)   Aortic stenosis   Obesity   CAD (coronary artery disease)   Severe aortic stenosis   Left hip fracture -status post surgical repair on 2/22.  --PT and OT recommend SNF, TOC following for placement --Lovenox, foot pumps, TED hose for DVT prophylaxis --Toe-touch weightbearing on the left lower extremity --Follow-up in [facility] Orth[facility] clinic 2 weeks for staple removal and x-ray --Analgesics as needed --Bowel regimen  Acute respiratory failure with hypoxia - has been requiring supplemental oxygen of 4-6 L/min in postop setting.   D-dimer elevated.  High risk for PE in setting of postop hip repair, immobility. --Titrate O2 for sats > 88%, wean as tolerated --CTA chest is pending to evaluate for PE --Incentive  spirometry --Pulmonology consulted, refer to their recommendations --Will like d/c with oxygen  Severe aortic stenosis -cardiology consulted.  Blood pressures low/normal. --Coreg stopped --Outpatient follow-up recommended for TAVR evaluation  Coronary artery disease / History of inferior MI status post PCI -stable.  EF preserved. --Continue aspirin and statin --Beta-blocker has been stopped as above   Obesity: Body mass index is 39.94 kg/m.  Complicates overall care and prognosis.  DVT prophylaxis: enoxaparin (LOVENOX) injection 40 mg Start: 10/04/20 0800 SCDs Start: 10/03/20 1908   Diet:  Diet Orders (From admission, onward)    Start     Ordered   10/07/20 1449  Diet regular Room service appropriate? Yes; Fluid consistency: Thin  Diet effective now       Question Answer Comment  Room service appropriate? Yes   Fluid consistency: Thin      10/07/20 1448            Code Status: Full Code    Subjective 10/07/20    Patient is postop day 2 from repair of her left hip fracture.  She is still on 4 L of oxygen, does not use any at home.  She denies any cough, shortness of breath, fevers or chills, chest pain with inspiration or other complaints.  Pain fairly well controlled.   Disposition Plan & Communication   Status is: Inpatient  Remains inpatient appropriate because:Inpatient level of care appropriate due to severity of illness.  Patient continues require supplemental oxygen, evaluation of this is underway.  Weaning oxygen.   Dispo: The patient is from: Home  Anticipated d/c is to: SNF              Anticipated d/c date is: 2 days              Patient currently is not medically stable to d/c.   Difficult to place patient No  Family Communication: Daughter at bedside on rounds today   Consults, Procedures, Significant Events   Consultants:   Orthopedics  Cardiology  Procedures:   IM nail fixation of left intertrochanteric femur fracture  2/22  Antimicrobials:  Anti-infectives (From admission, onward)   Start     Dose/Rate Route Frequency Ordered Stop   10/03/20 2000  ceFAZolin (ANCEF) IVPB 2g/100 mL premix        2 g 200 mL/hr over 30 Minutes Intravenous Every 6 hours 10/03/20 1907 10/04/20 1124        Micro    Objective   Vitals:   10/07/20 0646 10/07/20 0749 10/07/20 1102 10/07/20 1528  BP:  121/80 (!) 115/57 (!) 88/42  Pulse:  100 92 85  Resp:  Temp:  (!) 97.5 F (36.4 C) 98.2 F (36.8 C) 98.2 F (36.8 C)  TempSrc:  Oral Oral   SpO2: 94% 97% 99% 91%  Weight:      Height:        Intake/Output Summary (Last 24 hours) at 10/07/2020 1550 Last data filed at 10/07/2020 1345 Gross per 24 hour  Intake 240 ml  Output 1050 ml  Net -810 ml   Filed Weights   10/02/20 0859  Weight: 108.9 kg    Physical Exam:  General exam: awake, alert, no acute distress, obese Respiratory system: CTAB, no wheezes, rales or rhonchi, normal respiratory effort, on 4 L/min nasal cannula oxygen. Cardiovascular system: normal S1/S2, RRR, no pedal edema.   Central nervous system: A&O x3. no gross focal neurologic deficits, normal speech Extremities: moves all,, no cyanosis, normal tone Psychiatry: normal mood, congruent affect, judgement and insight appear normal  Labs   Data Reviewed: I have personally reviewed following labs and imaging studies  CBC: Recent Labs  Lab 10/02/20 0907 10/04/20 0423 10/05/20 0441 10/06/20 0434 10/06/20 1249 10/07/20 0345  WBC 10.9* 12.3* 10.4 11.3*  --  10.8*  NEUTROABS 8.1* 10.3* 6.8 6.3  --  6.0  HGB 13.9 9.8* 8.5* 7.8* 8.2* 8.4*  HCT 43.9 31.0* 25.8* 25.1* 26.5* 26.8*  MCV 87.3 88.8 86.9 88.7  --  89.3  PLT 196 145* 145* 163  --  196   Basic Metabolic Panel: Recent Labs  Lab 10/02/20 0907 10/04/20 0423 10/05/20 0441 10/06/20 0434 10/07/20 0345  NA 136 132* 134* 134* 136  K 4.2 4.7 4.8 4.9 4.8  CL 104 102 102 101 99  CO2 GLUCOSE 118* 129* 120*  118* 119*  BUN 19 19 26* 20 16  CREATININE 0.87 0.91 0.88 0.75 0.68  CALCIUM 8.7* 7.9* 7.9* 8.2* 8.4*  MG  --  1.9 2.2  --   --   PHOS  --  3.2 2.8  --   --    GFR: Estimated Creatinine Clearance: 64.3 mL/min (by C-G formula based on SCr of 0.68 mg/dL). Liver Function Tests: Recent Labs  Lab 10/04/20 0423 10/05/20 0441 10/06/20 0434 10/07/20 0345  AST 39 43* 38 42*  ALT ALKPHOS 46 42 46 53  BILITOT 1.5* 1.6* 1.9* 2.4*  PROT 5.9* 5.7* 6.0* 6.2*  ALBUMIN 2.6* 2.6* 2.5* 2.6*  No results for input(s): LIPASE, AMYLASE in the last 168 hours. No results for input(s): AMMONIA in the last 168 hours. Coagulation Profile: Recent Labs  Lab 10/02/20 1153  INR 1.1   Cardiac Enzymes: No results for input(s): CKTOTAL, CKMB, CKMBINDEX, TROPONINI in the last 168 hours. BNP (last 3 results) No results for input(s): PROBNP in the last 8760 hours. HbA1C: No results for input(s): HGBA1C in the last 72 hours. CBG: No results for input(s): GLUCAP in the last 168 hours. Lipid Profile: No results for input(s): CHOL, HDL, LDLCALC, TRIG, CHOLHDL, LDLDIRECT in the last 72 hours. Thyroid Function Tests: No results for input(s): TSH, T4TOTAL, FREET4, T3FREE, THYROIDAB in the last 72 hours. Anemia Panel: No results for input(s): VITAMINB12, FOLATE, FERRITIN, TIBC, IRON, RETICCTPCT in the last 72 hours. Sepsis Labs: Recent Labs  Lab 10/05/20 0441 10/06/20 0434 10/07/20 0345  PROCALCITON 0.26 0.15 0.15    Recent Results (from the past 240 hour(s))  Resp Panel by RT-PCR (Flu A&B, Covid) Nasopharyngeal Swab     Status: None   Collection Time: 10/02/20  9:07 AM   Specimen: Nasopharyngeal Swab; Nasopharyngeal(NP) swabs in vial transport medium  Result Value Ref Range Status   SARS Coronavirus 2 by RT PCR NEGATIVE NEGATIVE Final    Comment: (NOTE) SARS-CoV-2 target nucleic acids are NOT DETECTED.  The SARS-CoV-2 RNA is generally detectable in upper respiratory specimens during  the acute phase of infection. The lowest concentration of SARS-CoV-2 viral copies this assay can detect is 138 copies/mL. A negative result does not preclude SARS-Cov-2 infection and should not be used as the sole basis for treatment or other patient management decisions. A negative result may occur with  improper specimen collection/handling, submission of specimen other than nasopharyngeal swab, presence of viral mutation(s) within the areas targeted by this assay, and inadequate number of viral copies(<138 copies/mL). A negative result must be combined with clinical observations, patient history, and epidemiological information. The expected result is Negative.  Fact Sheet for Patients:  BloggerCourse.com  Fact Sheet for Healthcare Providers:  SeriousBroker.it  This test is no t yet approved or cleared by the [place] FDA and  has been authorized for detection and/or diagnosis of SARS-CoV-2 by FDA under an Emergency Use Authorization (EUA). This EUA will remain  in effect (meaning this test can be used) for the duration of the COVID-19 declaration under Section 564(b)(1) of the Act, 21 U.S.C.section 360bbb-3(b)(1), unless the authorization is terminated  or revoked sooner.       Influenza A by PCR NEGATIVE NEGATIVE Final   Influenza B by PCR NEGATIVE NEGATIVE Final    Comment: (NOTE) The Xpert Xpress SARS-CoV-2/FLU/RSV plus assay is intended as an aid in the diagnosis of influenza from Nasopharyngeal swab specimens and should not be used as a sole basis for treatment. Nasal washings and aspirates are unacceptable for Xpert Xpress SARS-CoV-2/FLU/RSV testing.  Fact Sheet for Patients: BloggerCourse.com  Fact Sheet for Healthcare Providers: SeriousBroker.it  This test is not yet approved or cleared by the [place] FDA and has been authorized for detection and/or  diagnosis of SARS-CoV-2 by FDA under an Emergency Use Authorization (EUA). This EUA will remain in effect (meaning this test can be used) for the duration of the COVID-19 declaration under Section 564(b)(1) of the Act, 21 U.S.C. section 360bbb-3(b)(1), unless the authorization is terminated or revoked.  Performed at [facility], [address] [place], [place] [address]       Imaging Studies   US Venous  Img Lower Bilateral (DVT)  Result Date: 10/06/2020 CLINICAL DATA:  Hypoxia, acute respiratory failure EXAM: BILATERAL LOWER EXTREMITY VENOUS DOPPLER ULTRASOUND TECHNIQUE: Gray-scale sonography with compression, as well as color and duplex ultrasound, were performed to evaluate the deep venous system(s) from the level of the common femoral vein through the popliteal and proximal calf veins. COMPARISON:  04/29/2018 FINDINGS: VENOUS Normal compressibility of the common femoral, superficial femoral, and popliteal veins, as well as the visualized calf veins. Visualized portions of profunda femoral vein and great saphenous vein unremarkable. No filling defects to suggest DVT on grayscale or color Doppler imaging. Doppler waveforms show normal direction of venous flow, normal respiratory phasicity and response to augmentation. Limited views of the contralateral common femoral vein are unremarkable. OTHER None. Limitations: Technologist describes technically difficult study secondary to swelling and pain in the region of the left femoral vein. IMPRESSION: No femoropopliteal DVT nor evidence of DVT within the visualized calf veins. If clinical symptoms are inconsistent or if there are persistent or worsening symptoms, further imaging (possibly involving the iliac veins) may be warranted. Electronically Signed   By: Corlis Leak M.D.   On: 10/06/2020 16:27   US Abdomen Limited RUQ (LIVER/GB)  Result Date: 10/07/2020 CLINICAL DATA:  Hyperbilirubinemia EXAM: ULTRASOUND ABDOMEN LIMITED RIGHT UPPER  QUADRANT COMPARISON:  None. FINDINGS: Gallbladder: Multiple mobile gallstones measuring up to 1.7 cm. No pericholecystic fluid or wall thickening visualized. No sonographic Murphy sign noted by sonographer. Common bile duct: Diameter: 7 mm which is within normal limits for patient's age. Liver: No focal lesion identified. Within normal limits in parenchymal echogenicity. Portal vein is patent on color Doppler imaging with normal direction of blood flow towards the liver. Other: None. IMPRESSION: Cholelithiasis without sonographic evidence of acute cholecystitis. Electronically Signed   By: Maudry Mayhew MD   On: 10/07/2020 14:39     Medications   Scheduled Meds: . acetaminophen  1,000 mg Oral Q8H  . aspirin EC  81 mg Oral Daily  . Chlorhexidine Gluconate Cloth  6 each Topical Daily  . docusate sodium  100 mg Oral BID  . enoxaparin (LOVENOX) injection  40 mg Subcutaneous Q24H  . feeding supplement  237 mL Oral BID BM  . furosemide  20 mg Intravenous BID  . multivitamin with minerals  1 tablet Oral Daily  . pantoprazole (PROTONIX) IV  40 mg Intravenous Q12H  . polyethylene glycol  17 g Oral Daily  . rosuvastatin  10 mg Oral Daily  . senna-docusate  1 tablet Oral BID  . traZODone  50 mg Oral QHS   Continuous Infusions: . methocarbamol (ROBAXIN) IV         LOS: 5 days    Time spent: 30 minutes with greater than 30% spent in coordination of care and at bedside    Pennie Banter, DO [facility]  10/07/2020, 3:50 PM      If 7PM-7AM, please contact night-coverage. How to contact the [facility] Attending or Consulting provider 7A - 7P or covering provider during after hours 7P -7A, for this patient?    1. Check the care team in [facility] and look for a) attending/consulting [facility] provider listed and b) the [facility] team listed 2. Log into [email or web address] and use [place]'s universal password to access. If you do not have the password, please contact the hospital operator. 3. Locate the  [facility] provider you are looking for under [facility] and page to a number that you can be directly reached. 4. If you still  have difficulty reaching the provider, please page the [facility] (Director on Call) for the Hospitalists listed on amion for assistance.  PROGRESS NOTE    Levina Boyack   ZHY:[id number]  DOB: [birth year]  PCP: Patient, No Pcp Per    DOA: 10/02/2020 LOS: 5   Brief Narrative   87 y.o. WF PMHx aortic stenosis, CAD, dyslipidemia, obesity who presented to the ED on 10/03/20 via EMS after a mechanical fall at home resulting in a LEFT proximal femur fracture.  Patient reported the fall occurred when she lost her balance walking around her bed to turn up the thermostat, falling into a dresser and landing on her left side.    Admitted to the hospitalist service with orthopedics consulted.  Patient underwent surgery on afternoon of 2/22, with intramedullary nailing of the LEFT femur.  Patient evaluated by PT and OT postoperatively, and SNF for short-term rehab was recommended after discharge.  TOC is following and placement is underway.  Admission has been prolonged due to persistent hypoxia.  Patient does not typically require supplemental oxygen, has need ~4 l/min since surgery.  Evaluation underway and pulmonary consulted.    Assessment & Plan   Principal Problem:   Closed comminuted intertrochanteric fracture of proximal end of left femur (HCC) Active Problems:   Acute respiratory failure with hypoxia (HCC)   Aortic stenosis   Obesity   CAD (coronary artery disease)   Severe aortic stenosis   Left hip fracture -status post surgical repair on 2/22.  --PT and OT recommend SNF, TOC following for placement --Lovenox, foot pumps, TED hose for DVT prophylaxis --Toe-touch weightbearing on the left lower extremity --Follow-up in [facility] clinic 2 weeks for staple removal and x-ray --Analgesics as needed --Bowel regimen  Acute respiratory failure with hypoxia - has  been requiring supplemental oxygen of 4-6 L/min in postop setting.   D-dimer elevated.  High risk for PE in setting of postop hip repair, immobility.   CTA chest on 2/24 had a lot of artifact, no definitive PE, did show enlarged main pulmonary artery consistent with pulmonary HTN --Lower extremity dopplers pending (tomorrow per pt request) --Pulmonology consulted, appreciate recs --Titrate O2 for sats > 88%, wean as tolerated --Incentive spirometry  Acute blood loss anemia - Hbg has trended down over past few days.  Hbg this AM is 7.8 (was 13.9 pre-op).  No evidence of bleeding. --Follow H&H / CBC --Transfuse pRBCs if Hbg < 7.0 --Low threshold to stop Lovenox, but high risk for VTE so continue with caution for now --Anemia panel with AM labs --Consider iron infusion --IV Protonix BID for now  Severe aortic stenosis -cardiology consulted.  Blood pressures low/normal. --Coreg stopped --Outpatient follow-up recommended for TAVR evaluation  Coronary artery disease / History of inferior MI status post PCI -stable.  EF preserved. --Continue aspirin and statin --Beta-blocker has been stopped as above   Obesity: Body mass index is 39.94 kg/m.  Complicates overall care and prognosis.  DVT prophylaxis: enoxaparin (LOVENOX) injection 40 mg Start: 10/04/20 0800 SCDs Start: 10/03/20 1908   Diet:  Diet Orders (From admission, onward)    Start     Ordered   10/07/20 1449  Diet regular Room service appropriate? Yes; Fluid consistency: Thin  Diet effective now       Question Answer Comment  Room service appropriate? Yes   Fluid consistency: Thin      10/07/20 1448            Code Status: Full Code  Subjective 10/07/20    Patient getting ready to go to ultrasound.  Seen with sister at bedside.  Patient seen by pulmonology and we discussed their recommendations, she seems not to fully understand.  Denies fever/chills.  Says she's felt better, but has no specific complaints.  Hip pain  okay, but she's very uncomfortable in the bed.   Disposition Plan & Communication   Status is: Inpatient  Remains inpatient appropriate because:Inpatient level of care appropriate due to severity of illness.  Patient continues require supplemental oxygen, evaluation of this is underway.  Weaning oxygen.  Pulmonology consulted.  Also monitoring acute anemia.   Dispo: The patient is from: Home              Anticipated d/c is to: SNF              Anticipated d/c date is: 2-3 days              Patient currently is not medically stable to d/c.   Difficult to place patient No  Family Communication: Daughter at bedside on rounds today   Consults, Procedures, Significant Events   Consultants:   Orthopedics  Cardiology  Pulmonology  Procedures:   IM nail fixation of left intertrochanteric femur fracture 2/22  Antimicrobials:  Anti-infectives (From admission, onward)   Start     Dose/Rate Route Frequency Ordered Stop   10/03/20 2000  ceFAZolin (ANCEF) IVPB 2g/100 mL premix        2 g 200 mL/hr over 30 Minutes Intravenous Every 6 hours 10/03/20 1907 10/04/20 1124        Micro    Objective   Vitals:   10/07/20 0646 10/07/20 0749 10/07/20 1102 10/07/20 1528  BP:  121/80 (!) 115/57 (!) 88/42  Pulse:  100 92 85  Resp:  17 19 18   Temp:  (!) 97.5 F (36.4 C) 98.2 F (36.8 C) 98.2 F (36.8 C)  TempSrc:  Oral Oral   SpO2: 94% 97% 99% 91%  Weight:      Height:        Intake/Output Summary (Last 24 hours) at 10/07/2020 1550 Last data filed at 10/07/2020 1345 Gross per 24 hour  Intake 240 ml  Output 1050 ml  Net -810 ml   Filed Weights   10/02/20 0859  Weight: 108.9 kg    Physical Exam:  General exam: up in chair, awake, alert, no acute distress, obese Respiratory system: normal respiratory effort, symmetric chest rise, on 4 L/min nasal cannula oxygen. Cardiovascular system: RRR, no peripheral edema   GI: soft, non-tender Extremities: shins tender on  palpation, no erythema warmth or rash seen, moves all, normal tone Psychiatry: normal mood, congruent affect  Labs   Data Reviewed: I have personally reviewed following labs and imaging studies  CBC: Recent Labs  Lab 10/02/20 0907 10/04/20 0423 10/05/20 0441 10/06/20 0434 10/06/20 1249 10/07/20 0345  WBC 10.9* 12.3* 10.4 11.3*  --  10.8*  NEUTROABS 8.1* 10.3* 6.8 6.3  --  6.0  HGB 13.9 9.8* 8.5* 7.8* 8.2* 8.4*  HCT 43.9 31.0* 25.8* 25.1* 26.5* 26.8*  MCV 87.3 88.8 86.9 88.7  --  89.3  PLT 196 145* 145* 163  --  196   Basic Metabolic Panel: Recent Labs  Lab 10/02/20 0907 10/04/20 0423 10/05/20 0441 10/06/20 0434 10/07/20 0345  NA 136 132* 134* 134* 136  K 4.2 4.7 4.8 4.9 4.8  CL 104 102 102 101 99  CO2 26 25 27 28 30   GLUCOSE  118* 129* 120* 118* 119*  BUN 19 19 26* 20 16  CREATININE 0.87 0.91 0.88 0.75 0.68  CALCIUM 8.7* 7.9* 7.9* 8.2* 8.4*  MG  --  1.9 2.2  --   --   PHOS  --  3.2 2.8  --   --    GFR: Estimated Creatinine Clearance: 64.3 mL/min (by C-G formula based on SCr of 0.68 mg/dL). Liver Function Tests: Recent Labs  Lab 10/04/20 0423 10/05/20 0441 10/06/20 0434 10/07/20 0345  AST 39 43* 38 42*  ALT 12 9 9 17   ALKPHOS 46 42 46 53  BILITOT 1.5* 1.6* 1.9* 2.4*  PROT 5.9* 5.7* 6.0* 6.2*  ALBUMIN 2.6* 2.6* 2.5* 2.6*   No results for input(s): LIPASE, AMYLASE in the last 168 hours. No results for input(s): AMMONIA in the last 168 hours. Coagulation Profile: Recent Labs  Lab 10/02/20 1153  INR 1.1   Cardiac Enzymes: No results for input(s): CKTOTAL, CKMB, CKMBINDEX, TROPONINI in the last 168 hours. BNP (last 3 results) No results for input(s): PROBNP in the last 8760 hours. HbA1C: No results for input(s): HGBA1C in the last 72 hours. CBG: No results for input(s): GLUCAP in the last 168 hours. Lipid Profile: No results for input(s): CHOL, HDL, LDLCALC, TRIG, CHOLHDL, LDLDIRECT in the last 72 hours. Thyroid Function Tests: No results for  input(s): TSH, T4TOTAL, FREET4, T3FREE, THYROIDAB in the last 72 hours. Anemia Panel: No results for input(s): VITAMINB12, FOLATE, FERRITIN, TIBC, IRON, RETICCTPCT in the last 72 hours. Sepsis Labs: Recent Labs  Lab 10/05/20 0441 10/06/20 0434 10/07/20 0345  PROCALCITON 0.26 0.15 0.15    Recent Results (from the past 240 hour(s))  Resp Panel by RT-PCR (Flu A&B, Covid) Nasopharyngeal Swab     Status: None   Collection Time: 10/02/20  9:07 AM   Specimen: Nasopharyngeal Swab; Nasopharyngeal(NP) swabs in vial transport medium  Result Value Ref Range Status   SARS Coronavirus 2 by RT PCR NEGATIVE NEGATIVE Final    Comment: (NOTE) SARS-CoV-2 target nucleic acids are NOT DETECTED.  The SARS-CoV-2 RNA is generally detectable in upper respiratory specimens during the acute phase of infection. The lowest concentration of SARS-CoV-2 viral copies this assay can detect is 138 copies/mL. A negative result does not preclude SARS-Cov-2 infection and should not be used as the sole basis for treatment or other patient management decisions. A negative result may occur with  improper specimen collection/handling, submission of specimen other than nasopharyngeal swab, presence of viral mutation(s) within the areas targeted by this assay, and inadequate number of viral copies(<138 copies/mL). A negative result must be combined with clinical observations, patient history, and epidemiological information. The expected result is Negative.  Fact Sheet for Patients:  10/04/20  Fact Sheet for Healthcare Providers:  BloggerCourse.com  This test is no t yet approved or cleared by the [place] FDA and  has been authorized for detection and/or diagnosis of SARS-CoV-2 by FDA under an Emergency Use Authorization (EUA). This EUA will remain  in effect (meaning this test can be used) for the duration of the COVID-19 declaration under Section  564(b)(1) of the Act, 21 U.S.C.section 360bbb-3(b)(1), unless the authorization is terminated  or revoked sooner.       Influenza A by PCR NEGATIVE NEGATIVE Final   Influenza B by PCR NEGATIVE NEGATIVE Final    Comment: (NOTE) The Xpert Xpress SARS-CoV-2/FLU/RSV plus assay is intended as an aid in the diagnosis of influenza from Nasopharyngeal swab specimens and should not be used as  a sole basis for treatment. Nasal washings and aspirates are unacceptable for Xpert Xpress SARS-CoV-2/FLU/RSV testing.  Fact Sheet for Patients: BloggerCourse.com  Fact Sheet for Healthcare Providers: SeriousBroker.it  This test is not yet approved or cleared by the [place] FDA and has been authorized for detection and/or diagnosis of SARS-CoV-2 by FDA under an Emergency Use Authorization (EUA). This EUA will remain in effect (meaning this test can be used) for the duration of the COVID-19 declaration under Section 564(b)(1) of the Act, 21 U.S.C. section 360bbb-3(b)(1), unless the authorization is terminated or revoked.  Performed at [facility], [address] [place], [place] [address]       Imaging Studies   US Venous Img Lower Bilateral (DVT)  Result Date: 10/06/2020 CLINICAL DATA:  Hypoxia, acute respiratory failure EXAM: BILATERAL LOWER EXTREMITY VENOUS DOPPLER ULTRASOUND TECHNIQUE: Gray-scale sonography with compression, as well as color and duplex ultrasound, were performed to evaluate the deep venous system(s) from the level of the common femoral vein through the popliteal and proximal calf veins. COMPARISON:  04/29/2018 FINDINGS: VENOUS Normal compressibility of the common femoral, superficial femoral, and popliteal veins, as well as the visualized calf veins. Visualized portions of profunda femoral vein and great saphenous vein unremarkable. No filling defects to suggest DVT on grayscale or color Doppler imaging. Doppler  waveforms show normal direction of venous flow, normal respiratory phasicity and response to augmentation. Limited views of the contralateral common femoral vein are unremarkable. OTHER None. Limitations: Technologist describes technically difficult study secondary to swelling and pain in the region of the left femoral vein. IMPRESSION: No femoropopliteal DVT nor evidence of DVT within the visualized calf veins. If clinical symptoms are inconsistent or if there are persistent or worsening symptoms, further imaging (possibly involving the iliac veins) may be warranted. Electronically Signed   By: Corlis Leak M.D.   On: 10/06/2020 16:27   US Abdomen Limited RUQ (LIVER/GB)  Result Date: 10/07/2020 CLINICAL DATA:  Hyperbilirubinemia EXAM: ULTRASOUND ABDOMEN LIMITED RIGHT UPPER QUADRANT COMPARISON:  None. FINDINGS: Gallbladder: Multiple mobile gallstones measuring up to 1.7 cm. No pericholecystic fluid or wall thickening visualized. No sonographic Murphy sign noted by sonographer. Common bile duct: Diameter: 7 mm which is within normal limits for patient's age. Liver: No focal lesion identified. Within normal limits in parenchymal echogenicity. Portal vein is patent on color Doppler imaging with normal direction of blood flow towards the liver. Other: None. IMPRESSION: Cholelithiasis without sonographic evidence of acute cholecystitis. Electronically Signed   By: Maudry Mayhew MD   On: 10/07/2020 14:39     Medications   Scheduled Meds: . acetaminophen  1,000 mg Oral Q8H  . aspirin EC  81 mg Oral Daily  . Chlorhexidine Gluconate Cloth  6 each Topical Daily  . docusate sodium  100 mg Oral BID  . enoxaparin (LOVENOX) injection  40 mg Subcutaneous Q24H  . feeding supplement  237 mL Oral BID BM  . furosemide  20 mg Intravenous BID  . multivitamin with minerals  1 tablet Oral Daily  . pantoprazole (PROTONIX) IV  40 mg Intravenous Q12H  . polyethylene glycol  17 g Oral Daily  . rosuvastatin  10 mg Oral  Daily  . senna-docusate  1 tablet Oral BID  . traZODone  50 mg Oral QHS   Continuous Infusions: . methocarbamol (ROBAXIN) IV         LOS: 5 days    Time spent: 25 minutes with greater than 30% spent in coordination of care and at  bedside    Pennie Banter, DO [facility]  10/07/2020, 3:50 PM      If 7PM-7AM, please contact night-coverage. How to contact the [facility] Attending or Consulting provider 7A - 7P or covering provider during after hours 7P -7A, for this patient?    5. Check the care team in [facility] and look for a) attending/consulting [facility] provider listed and b) the [facility] team listed 6. Log into [email or web address] and use [facility]'s universal password to access. If you do not have the password, please contact the hospital operator. 7. Locate the [facility] provider you are looking for under [facility] and page to a number that you can be directly reached. 8. If you still have difficulty reaching the provider, please page the [facility] (Director on Call) for the Hospitalists listed on amion for assistance.

## 2020-10-07 NOTE — Consult Note (Signed)
Pulmonary Medicine          Date: 10/07/2020,   MRN# [id number] Monica [name] 04-10-1934     AdmissionWeight: 108.9 kg                 CurrentWeight: 108.9 kg   Referring physician: Dr Denton Lank   CHIEF COMPLAINT:   Post-operative respiratory failure   HISTORY OF PRESENT ILLNESS   87 y.o. female with medical history significant for aortic stenosis, coronary artery disease, dyslipidemia and obesity who presents to the ER via EMS for evaluation following a mechanical fall.  Patientfell into the dresser landing on her left side.  She was unable to get up or bear weight on the left leg and her daughter who heard her fall called [id number].  She denied feeling dizzy or lightheaded prior to the fall and denied head trauma or loss of consciousness.  Per EMS her left lower extremity was shortened. Patient denies having any chest pain, no shortness of breath, no palpitations, no diaphoresis, no nausea, no vomiting, no abdominal pain, no diarrhea, no constipation, no fever, no chills, no cough, no sore throat, no urinary frequency, no nocturia, no dysuria, no headache, no blurred vision, weakness or focal deficits.Left femur x-ray shows severely comminuted and displaced intertrochanteric fracture of proximal left femur  Orthopedic surgery was consulted in the ER and surgical repair is planned for 10/02/20. Patient was noted to have inreased oxygen requirement post operatively.CTPE was done and included in pictorial documentation below with findings of diffuse congestion with interstitial edema and prominent vascular pedicle and generous pulmonary arteries with GN:FAOZHY index >1 suggestive of PH as well as mild flattening and enlargement of RV size suggestive of RV strain.  There is absence of consolidated infiltrate to suggest pneumonia, there is only mild bibasilar atelectasis, no masses or significant nodules are present. The adenopathy noted here is significant and may be reactive to congestion  or infective/inflammatory process. Patient does appear to have chronic moderate normocytic anemia. She is with hypoalbuminemia which in some instances may be due to malnuritment when not in context of liver or renal failure and leads to additional third spacing of fluid. She has had transient episodes of hypoxemia overnight despite supplemental O2.  She has had episodes of hypotension overnight but mostly vitals have been in reference range.    PAST MEDICAL HISTORY   Past Medical History:  Diagnosis Date  . Aortic stenosis    a. LHC 06/02/17: At least moderate aortic stenosis with a peak to peak gradient of 22 mmHg; b. TTE 05/2017: EF 55-60%, mild HK basal-midinferior wall, Gr1DD, mod to sev AS w/ mean gradient 21 mmHg, valve area 0.99, mild MR, mildly dilated LA   . CAD (coronary artery disease) 2018   a. inferior STEMI 06/02/2017: LHC 06/02/17: LM 20, D1 20%, o-pLCx 50, p-mRCA 100% s/p PCI/DES, mRCA 50, dRCA 30  . GI bleed    a. noted 06/05/2017  . HLD (hyperlipidemia)   . Obesity      SURGICAL HISTORY   Past Surgical History:  Procedure Laterality Date  . ANKLE RECONSTRUCTION  1956   also ORIF of right arm  . CORONARY STENT INTERVENTION N/A 06/01/2017   Procedure: Coronary/Graft Acute MI Revascularization;  Surgeon: Iran Ouch, MD;  Location: [facility] INV[facility]IVE CV [facility];  Service: Cardiovascular;  Laterality: N/A;  . INTRAMEDULLARY (IM) NAIL INTERTROCHANTERIC Left 10/03/2020   Procedure: INTRAMEDULLARY (IM) NAIL INTERTROCHANTRIC;  Surgeon: Signa Kell, MD;  Location: [facility] ORS;  Service:  Orthopedics;  Laterality: Left;  . LEFT HEART CATH AND CORONARY ANGIOGRAPHY N/A 06/01/2017   Procedure: LEFT HEART CATH AND CORONARY ANGIOGRAPHY;  Surgeon: Iran Ouch, MD;  Location: [facility] INV[facility] CV [facility];  Service: Cardiovascular;  Laterality: N/A;     FAMILY HISTORY   Family History  Problem Relation Age of Onset  . Valvular heart disease Mother   . Diabetes Mellitus II Daughter       SOCIAL HISTORY   Social History   Tobacco Use  . Smoking status: Former Smoker    Packs/day: 0.50    Years: 50.00    Pack years: 25.00    Types: Cigarettes    Quit date: 06/01/2017    Years since quitting: 3.3  . Smokeless tobacco: Never Used  Vaping Use  . Vaping Use: Never used  Substance Use Topics  . Alcohol use: No  . Drug use: No     MEDICATIONS    Home Medication:  Current Outpatient Rx  . Order #: [id number] Class: Print  . Order #: [id number] Class: Print  . Order #: [id number] Class: Print    Current Medication:  Current Facility-Administered Medications:  .  acetaminophen (TYLENOL) tablet 1,000 mg, 1,000 mg, Oral, Q8H, Signa Kell, MD, 1,000 mg at 10/06/20 1400 .  aspirin EC tablet 81 mg, 81 mg, Oral, Daily, Signa Kell, MD, 81 mg at 10/07/20 0925 .  bisacodyl (DULCOLAX) EC tablet 5 mg, 5 mg, Oral, Daily PRN, Esaw Grandchild A, DO .  bisacodyl (DULCOLAX) suppository 10 mg, 10 mg, Rectal, Daily PRN, Signa Kell, MD .  Chlorhexidine Gluconate Cloth 2 % PADS 6 each, 6 each, Topical, Daily, Signa Kell, MD, 6 each at 10/07/20 [phone] .  docusate sodium (COLACE) capsule 100 mg, 100 mg, Oral, BID, Signa Kell, MD, 100 mg at 10/07/20 7846 .  enoxaparin (LOVENOX) injection 40 mg, 40 mg, Subcutaneous, Q24H, Signa Kell, MD, 40 mg at 10/07/20 9629 .  feeding supplement (ENSURE ENLIVE / ENSURE PLUS) liquid 237 mL, 237 mL, Oral, BID BM, Drema Dallas, MD, 237 mL at 10/07/20 0929 .  methocarbamol (ROBAXIN) tablet 500 mg, 500 mg, Oral, Q6H PRN, 500 mg at 10/05/20 0910 **OR** methocarbamol (ROBAXIN) 500 mg in dextrose 5 % 50 mL IVPB, 500 mg, Intravenous, Q6H PRN, Signa Kell, MD .  metoCLOPramide (REGLAN) tablet 5-10 mg, 5-10 mg, Oral, Q8H PRN **OR** metoCLOPramide (REGLAN) injection 5-10 mg, 5-10 mg, Intravenous, Q8H PRN, Signa Kell, MD .  multivitamin with minerals tablet 1 tablet, 1 tablet, Oral, Daily, Drema Dallas, MD, 1 tablet at 10/07/20 [phone] .  ondansetron  (ZOFRAN) tablet 4 mg, 4 mg, Oral, Q6H PRN **OR** ondansetron (ZOFRAN) injection 4 mg, 4 mg, Intravenous, Q6H PRN, Signa Kell, MD .  oxyCODONE (Oxy IR/ROXICODONE) immediate release tablet 2.5-5 mg, 2.5-5 mg, Oral, Q3H PRN, Signa Kell, MD, 5 mg at 10/04/20 0853 .  oxyCODONE (Oxy IR/ROXICODONE) immediate release tablet 5-10 mg, 5-10 mg, Oral, Q4H PRN, Signa Kell, MD, 10 mg at 10/07/20 0934 .  pantoprazole (PROTONIX) injection 40 mg, 40 mg, Intravenous, Q12H, Esaw Grandchild A, DO, 40 mg at 10/07/20 0925 .  polyethylene glycol (MIRALAX / GLYCOLAX) packet 17 g, 17 g, Oral, Daily, Esaw Grandchild A, DO, 17 g at 10/07/20 0926 .  rosuvastatin (CRESTOR) tablet 10 mg, 10 mg, Oral, Daily, Signa Kell, MD, 10 mg at 10/07/20 0929 .  senna-docusate (Senokot-S) tablet 1 tablet, 1 tablet, Oral, BID, Pennie Banter, DO, 1 tablet at 10/07/20 1324 .  sodium phosphate (FLEET) 7-19 GM/118ML  enema 1 enema, 1 enema, Rectal, Once PRN, Signa Kell, MD .  traMADol Janean Sark) tablet 50 mg, 50 mg, Oral, Q6H PRN, Signa Kell, MD, 50 mg at 10/06/20 1012 .  traZODone (DESYREL) tablet 50 mg, 50 mg, Oral, QHS, Esaw Grandchild A, DO, 50 mg at 10/06/20 2140    ALLERGIES   Patient has no known allergies.     REVIEW OF SYSTEMS    Review of Systems:  Gen:  Denies  fever, sweats, chills weigh loss  HEENT: Denies blurred vision, double vision, ear pain, eye pain, hearing loss, nose bleeds, sore throat Cardiac:  No dizziness, chest pain or heaviness, chest tightness,edema Resp:   Denies cough or sputum porduction, shortness of breath,wheezing, hemoptysis,  Gi: Denies swallowing difficulty, stomach pain, nausea or vomiting, diarrhea, constipation, bowel incontinence Gu:  Denies bladder incontinence, burning urine Ext:   Denies Joint pain, stiffness or swelling Skin: Denies  skin rash, easy bruising or bleeding or hives Endoc:  Denies polyuria, polydipsia , polyphagia or weight change Psych:   Denies depression,  insomnia or hallucinations   Other:  All other systems negative   VS: BP 121/80 (BP Location: Right Arm)   Pulse 100   Temp (!) 97.5 F (36.4 C) (Oral)   Resp 17   Ht 5\' 5"  (1.651 m)   Wt 108.9 kg   SpO2 97%   BMI 39.94 kg/m      PHYSICAL EXAM    GENERAL:NAD, no fevers, chills, no weakness no fatigue HEAD: Normocephalic, atraumatic.  EYES: Pupils equal, round, reactive to light. Extraocular muscles intact. No scleral icterus.  MOUTH: Moist mucosal membrane. Dentition intact. No abscess noted.  EAR, NOSE, THROAT: Clear without exudates. No external lesions.  NECK: Supple. No thyromegaly. No nodules. No JVD.  PULMONARY: Diffuse coarse rhonchi right sided +wheezes CARDIOVASCULAR: S1 and S2. Regular rate and rhythm. No murmurs, rubs, or gallops. No edema. Pedal pulses 2+ bilaterally.  GASTROINTESTINAL: Soft, nontender, nondistended. No masses. Positive bowel sounds. No hepatosplenomegaly.  MUSCULOSKELETAL: No swelling, clubbing, or edema. Range of motion full in all extremities.  NEUROLOGIC: Cranial nerves II through XII are intact. No gross focal neurological deficits. Sensation intact. Reflexes intact.  SKIN: No ulceration, lesions, rashes, or cyanosis. Skin warm and dry. Turgor intact.  PSYCHIATRIC: Mood, affect within normal limits. The patient is awake, alert and oriented x 3. Insight, judgment intact.       IMAGING    CT ANGIO CHEST PE W OR WO CONTRAST  Result Date: 10/05/2020 CLINICAL DATA:  Elevated D-dimer, recent surgery EXAM: CT ANGIOGRAPHY CHEST WITH CONTRAST TECHNIQUE: Multidetector CT imaging of the chest was performed using the standard protocol during bolus administration of intravenous contrast. Multiplanar CT image reconstructions and MIPs were obtained to evaluate the vascular anatomy. CONTRAST:  60mL OMNIPAQUE IOHEXOL 350 MG/ML SOLN COMPARISON:  None. FINDINGS: Cardiovascular: Satisfactory opacification of the pulmonary arteries to the proximal segmental  level. However, there is respiratory motion. Question of nonocclusive right basal subsegmental and left lower lobe segmental and subsegmental filling defects. Enlargement of the main pulmonary artery measuring 3.7 cm suggesting pulmonary arterial hypertension. Cardiomegaly. Coronary artery calcification. No pericardial effusion. Thoracic aorta is normal in caliber with mixed plaque. Mediastinum/Nodes: Mediastinal adenopathy is present. A prevascular node on image 26 of series 4 measures 1.7 cm in short axis. Subcarinal node on image 42 also measures 2 cm in short axis. Nonenlarged hilar nodes. Small hiatal hernia. Thyroid is unremarkable. Lungs/Pleura: Imaging in expiration. Interstitial thickening and patchy ground-glass density. Bibasilar  atelectasis. Upper Abdomen: No acute abnormality.  Cholelithiasis. Musculoskeletal: Degenerative changes of the spine. No acute osseous abnormality. Review of the MIP images confirms the above findings. IMPRESSION: No definite pulmonary embolism. Question of nonocclusive right basal subsegmental and left lower lobe segmental and subsegmental filling defects. There is significant respiratory motion and these could be artifactual. Bibasilar atelectasis. Interstitial thickening and patchy ground-glass density, which could reflect additional atelectasis (imaging in expiration), mild edema, or less likely atypical pneumonia. Mediastinal adenopathy. Cardiomegaly. Enlargement of the main pulmonary artery suggesting pulmonary arterial hypertension. Electronically Signed   By: Guadlupe Spanish M.D.   On: 10/05/2020 15:06   DG Hand 2 View Left  Result Date: 10/02/2020 CLINICAL DATA:  Left hand pain after fall. EXAM: LEFT HAND - 2 VIEW COMPARISON:  None. FINDINGS: There is no evidence of fracture or dislocation. Moderate degenerative changes seen involving the first carpometacarpal joint. Soft tissues are unremarkable. IMPRESSION: Moderate osteoarthritis of the first carpometacarpal  joint. No acute abnormality seen in the left hand. Electronically Signed   By: Lupita Raider M.D.   On: 10/02/2020 09:52   US Venous Img Lower Bilateral (DVT)  Result Date: 10/06/2020 CLINICAL DATA:  Hypoxia, acute respiratory failure EXAM: BILATERAL LOWER EXTREMITY VENOUS DOPPLER ULTRASOUND TECHNIQUE: Gray-scale sonography with compression, as well as color and duplex ultrasound, were performed to evaluate the deep venous system(s) from the level of the common femoral vein through the popliteal and proximal calf veins. COMPARISON:  04/29/2018 FINDINGS: VENOUS Normal compressibility of the common femoral, superficial femoral, and popliteal veins, as well as the visualized calf veins. Visualized portions of profunda femoral vein and great saphenous vein unremarkable. No filling defects to suggest DVT on grayscale or color Doppler imaging. Doppler waveforms show normal direction of venous flow, normal respiratory phasicity and response to augmentation. Limited views of the contralateral common femoral vein are unremarkable. OTHER None. Limitations: Technologist describes technically difficult study secondary to swelling and pain in the region of the left femoral vein. IMPRESSION: No femoropopliteal DVT nor evidence of DVT within the visualized calf veins. If clinical symptoms are inconsistent or if there are persistent or worsening symptoms, further imaging (possibly involving the iliac veins) may be warranted. Electronically Signed   By: Corlis Leak M.D.   On: 10/06/2020 16:27   DG Chest Port 1 View  Result Date: 10/05/2020 CLINICAL DATA:  Acute respiratory failure EXAM: PORTABLE CHEST 1 VIEW COMPARISON:  10/02/2020 FINDINGS: Cardiac enlargement without heart failure. Prominent lung markings diffusely unchanged. No focal consolidation or effusion. Chronic right rib fractures. IMPRESSION: No interval change. Prominent interstitial lung markings bilaterally and diffusely could represent chronic lung disease  however infection or interstitial edema possible. Electronically Signed   By: Marlan Palau M.D.   On: 10/05/2020 10:37   DG Chest Portable 1 View  Result Date: 10/02/2020 CLINICAL DATA:  Left hip fracture. EXAM: PORTABLE CHEST 1 VIEW COMPARISON:  June 05, 2017. FINDINGS: Mild cardiomegaly is noted. No pneumothorax pleural effusion is noted. Increased diffuse interstitial densities are noted throughout both lungs which may represent diffuse scarring, but acute superimposed atypical inflammation or edema cannot be excluded. Old right rib fractures are noted. IMPRESSION: Increased diffuse interstitial densities are noted throughout both lungs which may represent diffuse scarring, but acute superimposed atypical inflammation or edema cannot be excluded. Electronically Signed   By: Lupita Raider M.D.   On: 10/02/2020 09:50   ECHOCARDIOGRAM COMPLETE  Result Date: 10/03/2020    ECHOCARDIOGRAM REPORT   Patient Name:  Corrinne Stauch Date of Exam: 10/03/2020 Medical Rec #:  [id number]      Height:       65.0 in Accession #:    [id number]     Weight:       240.0 lb Date of Birth:  Mar 16, 1934      BSA:          2.138 m Patient Age:    86 years       BP:           95/71 mmHg Patient Gender: F              HR:           86 bpm. Exam Location:  [facility] Procedure: 2D Echo, Color Doppler, Cardiac Doppler and Intracardiac            Opacification Agent Indications:     I35.0 Aortic stenosis  History:         Patient has prior history of Echocardiogram examinations, most                  recent 10/26/2019. CAD, Signs/Symptoms:Broken hip; Risk                  Factors:Dyslipidemia.  Sonographer:     Humphrey Rolls RDCS (AE) Referring Phys:  [id number] Sondra Barges Diagnosing Phys: Yvonne Kendall MD  Sonographer Comments: Suboptimal apical window. Image acquisition challenging due to patient body habitus. IMPRESSIONS  1. Left ventricular ejection fraction, by estimation, is 60 to 65%. The left ventricle has normal function. Left  ventricular endocardial border not optimally defined to evaluate regional wall motion. There is mild left ventricular hypertrophy. Left ventricular diastolic parameters are consistent with Grade I diastolic dysfunction (impaired relaxation). Elevated left atrial pressure.  2. Right ventricular systolic function is normal. The right ventricular size is mildly enlarged. Tricuspid regurgitation signal is inadequate for assessing PA pressure.  3. Left atrial size was mildly dilated.  4. Right atrial size was mildly dilated.  5. The mitral valve is degenerative. Trivial mitral valve regurgitation. Mild mitral stenosis. Moderate mitral annular calcification.  6. Tricuspid valve regurgitation at least moderate but suboptimally visualized.  7. The aortic valve is tricuspid. There is moderate calcification of the aortic valve. There is severe thickening of the aortic valve. Aortic valve regurgitation is not visualized. Severe aortic valve stenosis. Aortic valve area, by VTI measures 0.51 cm. Aortic valve mean gradient measures 60.0 mmHg. Aortic valve Vmax measures 4.83 m/s.  8. The inferior vena cava is normal in size with greater than 50% respiratory variability, suggesting right atrial pressure of 3 mmHg. Comparison(s): Very severe aortic stenosis, worsened compared with prior echocardiogram from 10/26/2019. FINDINGS  Left Ventricle: Left ventricular ejection fraction, by estimation, is 60 to 65%. The left ventricle has normal function. Left ventricular endocardial border not optimally defined to evaluate regional wall motion. Definity contrast agent was given IV to delineate the left ventricular endocardial borders. The left ventricular internal cavity size was normal in size. There is mild left ventricular hypertrophy. Left ventricular diastolic parameters are consistent with Grade I diastolic dysfunction (impaired relaxation). Elevated left atrial pressure. Right Ventricle: The right ventricular size is mildly enlarged.  No increase in right ventricular wall thickness. Right ventricular systolic function is normal. Tricuspid regurgitation signal is inadequate for assessing PA pressure. Left Atrium: Left atrial size was mildly dilated. Right Atrium: Right atrial size was mildly dilated. Pericardium: There is no evidence of pericardial effusion. Mitral Valve: The  mitral valve is degenerative in appearance. There is mild thickening of the mitral valve leaflet(s). Moderate mitral annular calcification. Trivial mitral valve regurgitation. Mild mitral valve stenosis. MV peak gradient, 11.2 mmHg. The  mean mitral valve gradient is 5.0 mmHg. Tricuspid Valve: The tricuspid valve is not well visualized. Tricuspid valve regurgitation at least moderate but suboptimally visualized. Aortic Valve: The aortic valve is tricuspid. There is moderate calcification of the aortic valve. There is severe thickening of the aortic valve. Aortic valve regurgitation is not visualized. Severe aortic stenosis is present. Aortic valve mean gradient measures 60.0 mmHg. Aortic valve peak gradient measures 93.3 mmHg. Aortic valve area, by VTI measures 0.51 cm. Pulmonic Valve: The pulmonic valve was not well visualized. Pulmonic valve regurgitation is not visualized. No evidence of pulmonic stenosis. Aorta: The aortic root is normal in size and structure. Pulmonary Artery: The pulmonary artery is not well seen. Venous: The inferior vena cava is normal in size with greater than 50% respiratory variability, suggesting right atrial pressure of 3 mmHg. IAS/Shunts: The interatrial septum was not well visualized.  LEFT VENTRICLE PLAX 2D LVIDd:         4.00 cm  Diastology LVIDs:         2.50 cm  LV e' medial:    8.38 cm/s LV PW:         1.36 cm  LV E/e' medial:  13.8 LV IVS:        1.09 cm  LV e' lateral:   5.44 cm/s LVOT diam:     1.90 cm  LV E/e' lateral: 21.3 LV SV:         51 LV SV Index:   24 LVOT Area:     2.84 cm  RIGHT VENTRICLE RV Basal diam:  4.60 cm LEFT ATRIUM            Index       RIGHT ATRIUM           Index LA diam:      3.90 cm 1.82 cm/m  RA Area:     23.00 cm LA Vol (A4C): 69.3 ml 32.42 ml/m RA Volume:   76.20 ml  35.65 ml/m  AORTIC VALVE                    PULMONIC VALVE AV Area (Vmax):    0.53 cm     PV Vmax:       1.23 m/s AV Area (Vmean):   0.53 cm     PV Vmean:      80.200 cm/s AV Area (VTI):     0.51 cm     PV VTI:        0.229 m AV Vmax:           483.00 cm/s  PV Peak grad:  6.1 mmHg AV Vmean:          346.000 cm/s PV Mean grad:  3.0 mmHg AV VTI:            1.007 m AV Peak Grad:      93.3 mmHg AV Mean Grad:      60.0 mmHg LVOT Vmax:         89.50 cm/s LVOT Vmean:        64.100 cm/s LVOT VTI:          0.181 m LVOT/AV VTI ratio: 0.18  AORTA Ao Root diam: 3.40 cm MITRAL VALVE MV Area (PHT): 7.29 cm     SHUNTS MV  Area VTI:   1.93 cm     Systemic VTI:  0.18 m MV Peak grad:  11.2 mmHg    Systemic Diam: 1.90 cm MV Mean grad:  5.0 mmHg MV Vmax:       1.67 m/s MV Vmean:      102.0 cm/s MV Decel Time: 104 msec MV E velocity: 116.00 cm/s MV A velocity: 164.00 cm/s MV E/A ratio:  0.71 Cristal Deer End MD Electronically signed by Yvonne Kendall MD Signature Date/Time: 10/03/2020/9:54:38 AM    Final    DG HIP OPERATIVE UNILAT W OR W/O PELVIS LEFT  Result Date: 10/03/2020 CLINICAL DATA:  Intramedullary nail on the LEFT. EXAM: OPERATIVE LEFT HIP (WITH PELVIS IF PERFORMED)  VIEWS TECHNIQUE: Fluoroscopic spot image(s) were submitted for interpretation post-operatively. COMPARISON:  October 02, 2020 FINDINGS: Six intraoperative fluoroscopic views with changes of intramedullary rod placement and screw fixation across the femoral neck spanning a comminuted intertrochanteric fracture of the LEFT proximal femur. Midportion of the device is not imaged. Distal interlocking screws of been placed in the distal femur within the metadiaphyseal region. Proximally there is a compression type screw in a cannulated lag screw in place across the femoral neck. Persistent  distraction of the lesser trochanteric fragment. IMPRESSION: Post ORIF of LEFT intertrochanteric fracture with intramedullary nailing and fixation devices as described. Midportion of the devices are not imaged.  Attention on follow-up. Electronically Signed   By: Donzetta Kohut M.D.   On: 10/03/2020 22:21   DG Hip Unilat W or Wo Pelvis 2-3 Views Left  Result Date: 10/02/2020 CLINICAL DATA:  Left hip pain after fall. EXAM: DG HIP (WITH OR WITHOUT PELVIS) 2-3V LEFT COMPARISON:  None. FINDINGS: Severely comminuted and displaced fracture is seen involving the intertrochanteric region of the proximal left femur. IMPRESSION: Severely comminuted and displaced intertrochanteric fracture of proximal left femur. Electronically Signed   By: Lupita Raider M.D.   On: 10/02/2020 09:49      ASSESSMENT/PLAN   Acute hypoxemic respiratory failure  - patient has cardiac history including severe AS, CAD s/p MI and PCI, however new worsening TTE with new findings of RV dilation and LA abnormalities as well as findings of pulmonary hypertension, this may be due to PE vs development of pulmonary hypertension.  I would recommend formal right heart catheterization.  Further possibility of PE has been evaluated with PE however not definitive due to motion.  I would like to investigate this further and will consider vascular surgery input.  Additionally will obtain ABG with shunt fraction. Occasionally patients may develop bone marrow emboli post long bone fractures.  She smoked in the past but denies COPD or emphysema history and has never used inhaler previously or supplemental oxygen therapy.  - She was not sick prior to fall will hold off on further testing for infection.   - Patient does have component of edema and is being diuresed, although fluid balance is documented as +900 cc she has >1L urine in canister above her head. -CT above does have further evidence of Pulmonary hypertension and I suspect this may be acute  or acute on chronic >> ABG, lasix, diuresis, cardiology consult -Anemia is contributing to dyspnea and she had GI bleed last year, currently on DVT ppx with lovenox, will order Fecal occult stool -She does do PT with OOB to chair but is deconditioned, will order Incentive spirometer and will hold off on Vest therapy due to fracture and will consider chest PT with Metaneb if does not  improve   Probable Pulmonary hypertension  - will hold off on pulmonary vasolidatory at this time  - cardiology consult - request for RHC to delineate pre/post capillary pulmonary pressures  -diuresis - lasix 20 bid with albumin 25% 1 amp daily IV -ABG -possible further workup for PE or BM embolism syndrome post long bone fracture - will think about V/Q vs vascular surgery eval ?. , will obtain ddimer and ABG in the interim  - PT/OT/CPT    Mild protein calorie malnutrition - decreased peripheral muscle mass with  low albumin  -contributing to third spacing of fluid and dyspnea - RD dietary consultation and continue PT/OT/CPT    Thank you for allowing me to participate in the care of this patient.   Patient/Family are satisfied with care plan and all questions have been answered.  This document was prepared using Dragon voice recognition software and may include unintentional dictation errors.     Vida RiggerFuad Kore Madlock, M.D.  Division of Pulmonary & Critical Care Medicine  [facility]

## 2020-10-07 NOTE — Progress Notes (Signed)
Subjective: 4 Days Post-Op Procedure(s) (LRB): INTRAMEDULLARY (IM) NAIL INTERTROCHANTRIC (Left) Patient reports pain as moderate. Patient is well, and has had no acute complaints or problems Plan is to go Rehab after hospital stay. Negative for chest pain and shortness of breath Fever: no Gastrointestinal: Negative for nausea and vomiting [place] of legs performed yesterday, negative for DVT. Patient has had a BM following surgery.  Objective: Vital signs in last 24 hours: Temp:  [97.5 F (36.4 C)-98.5 F (36.9 C)] 97.5 F (36.4 C) (02/26 0749) Pulse Rate:  [91-100] 100 (02/26 0749) Resp:  [17-18] 17 (02/26 0749) BP: (99-121)/(51-87) 121/80 (02/26 0749) SpO2:  [85 %-100 %] 97 % (02/26 0749)  Intake/Output from previous day:  Intake/Output Summary (Last 24 hours) at 10/07/2020 0831 Last data filed at 10/07/2020 0201 Gross per 24 hour  Intake 480 ml  Output 1250 ml  Net -770 ml    Intake/Output this shift: No intake/output data recorded.  Labs: Recent Labs    10/05/20 0441 10/06/20 0434 10/06/20 1249 10/07/20 0345  HGB 8.5* 7.8* 8.2* 8.4*   Recent Labs    10/06/20 0434 10/06/20 1249 10/07/20 0345  WBC 11.3*  --  10.8*  RBC 2.83*  --  3.00*  HCT 25.1* 26.5* 26.8*  PLT 163  --  196   Recent Labs    10/06/20 0434 10/07/20 0345  NA 134* 136  K 4.9 4.8  CL 101 99  CO2 28 30  BUN 20 16  CREATININE 0.75 0.68  GLUCOSE 118* 119*  CALCIUM 8.2* 8.4*   No results for input(s): LABPT, INR in the last 72 hours.  EXAM General - Patient is Alert and Oriented Extremity - Neurovascular intact Sensation intact distally Dorsiflexion/Plantar flexion intact Incision: dressing C/D/I Compartment soft  Patient does report some left arm pain with motion above her head.  No deformity or ecchymosis. Positive impingement test to the left shoulder. Dressing/Incision - clean, dry, no drainage.  Change this morning. Motor Function - intact, moving foot and toes well on exam.    Past Medical History:  Diagnosis Date  . Aortic stenosis    a. LHC 06/02/17: At least moderate aortic stenosis with a peak to peak gradient of 22 mmHg; b. TTE 05/2017: EF 55-60%, mild HK basal-midinferior wall, Gr1DD, mod to sev AS w/ mean gradient 21 mmHg, valve area 0.99, mild MR, mildly dilated LA   . CAD (coronary artery disease) 2018   a. inferior STEMI 06/02/2017: LHC 06/02/17: LM 20, D1 20%, o-pLCx 50, p-mRCA 100% s/p PCI/DES, mRCA 50, dRCA 30  . GI bleed    a. noted 06/05/2017  . HLD (hyperlipidemia)   . Obesity     Assessment/Plan: 4 Days Post-Op Procedure(s) (LRB): INTRAMEDULLARY (IM) NAIL INTERTROCHANTRIC (Left) Principal Problem:   Closed comminuted intertrochanteric fracture of proximal end of left femur (HCC) Active Problems:   Aortic stenosis   Obesity   CAD (coronary artery disease)   Severe aortic stenosis   Acute respiratory failure with hypoxia (HCC)  Estimated body mass index is 39.94 kg/m as calculated from the following:   Height as of this encounter: 5\' 5"  (1.651 m).   Weight as of this encounter: 108.9 kg. Advance diet Up with therapy D/C IV fluids   Labs reviewed this AM. Hg 8.4 this morning. Continue to wean off of O2 as tolerated. Plan for discharge to SNF when appropriate. negative for DVT yesterday. Upon discharge, follow-up with [facility] in two weeks for staple removal and x-rays of  the left femur.  DVT Prophylaxis - Lovenox, Foot Pumps and TED hose Toe-touch Weight-Bearing to left leg  J. Horris Latino, PA-C Orthopaedic Surgery 10/07/2020, 8:31 AM

## 2020-10-07 NOTE — Progress Notes (Addendum)
PT Cancellation Note  Patient Details Name: Monica [name] MRN: [id number] DOB: 03/15/1934   Cancelled Treatment:   1313:  PT attempt. Stopped by RN prior to entering room. RN care in progress. Will return at later time and continue to follow per POC.   1408: Pt is off floor for [place]. Daughter in room and requested to hold PT today. " She's having a rough day. Can you come back tomorrow?"  Rushie Chestnut 10/07/2020, 1:29 PM

## 2020-10-08 LAB — COMPREHENSIVE METABOLIC PANEL
ALT: 18 U/L (ref 0–44)
AST: 38 U/L (ref 15–41)
Albumin: 2.7 g/dL — ABNORMAL LOW (ref 3.5–5.0)
Alkaline Phosphatase: 54 U/L (ref 38–126)
Anion gap: 7 (ref 5–15)
BUN: 19 mg/dL (ref 8–23)
CO2: 30 mmol/L (ref 22–32)
Calcium: 8.2 mg/dL — ABNORMAL LOW (ref 8.9–10.3)
Chloride: 95 mmol/L — ABNORMAL LOW (ref 98–111)
Creatinine, Ser: 0.8 mg/dL (ref 0.44–1.00)
GFR, Estimated: >60 mL/min (ref >60)
Glucose, Bld: 116 mg/dL — ABNORMAL HIGH (ref 70–99)
Potassium: 4.4 mmol/L (ref 3.5–5.1)
Sodium: 132 mmol/L — ABNORMAL LOW (ref 135–145)
Total Bilirubin: 2.7 mg/dL — ABNORMAL HIGH (ref 0.3–1.2)
Total Protein: 6.1 g/dL — ABNORMAL LOW (ref 6.5–8.1)

## 2020-10-08 LAB — CBC WITH DIFFERENTIAL/PLATELET
Abs Immature Granulocytes: 0.06 10*3/uL (ref 0.00–0.07)
Basophils Absolute: 0.1 10*3/uL (ref 0.0–0.1)
Basophils Relative: 1 %
Eosinophils Absolute: 0.7 10*3/uL — ABNORMAL HIGH (ref 0.0–0.5)
Eosinophils Relative: 7 %
HCT: 24.8 % — ABNORMAL LOW (ref 36.0–46.0)
Hemoglobin: 7.8 g/dL — ABNORMAL LOW (ref 12.0–15.0)
Immature Granulocytes: 1 %
Lymphocytes Relative: 18 %
Lymphs Abs: 1.7 10*3/uL (ref 0.7–4.0)
MCH: 27.8 pg (ref 26.0–34.0)
MCHC: 31.5 g/dL (ref 30.0–36.0)
MCV: 88.3 fL (ref 80.0–100.0)
Monocytes Absolute: 1.4 10*3/uL — ABNORMAL HIGH (ref 0.1–1.0)
Monocytes Relative: 15 %
Neutro Abs: 5.5 10*3/uL (ref 1.7–7.7)
Neutrophils Relative %: 58 %
Platelets: 223 10*3/uL (ref 150–400)
RBC: 2.81 MIL/uL — ABNORMAL LOW (ref 3.87–5.11)
RDW: 14.7 % (ref 11.5–15.5)
WBC: 9.4 10*3/uL (ref 4.0–10.5)
nRBC: 0 % (ref 0.0–0.2)

## 2020-10-08 LAB — PROTIME-INR
INR: 1.1 (ref 0.8–1.2)
Prothrombin Time: 13.5 seconds (ref 11.4–15.2)

## 2020-10-08 LAB — LACTATE DEHYDROGENASE: LDH: 239 U/L — ABNORMAL HIGH (ref 98–192)

## 2020-10-08 LAB — BILIRUBIN, FRACTIONATED(TOT/DIR/INDIR)
Bilirubin, Direct: 0.5 mg/dL — ABNORMAL HIGH (ref 0.0–0.2)
Indirect Bilirubin: 2.5 mg/dL — ABNORMAL HIGH (ref 0.3–0.9)
Total Bilirubin: 3 mg/dL — ABNORMAL HIGH (ref 0.3–1.2)

## 2020-10-08 MED ORDER — PREDNISONE 10 MG (21) PO TBPK: 10.0000 mg | ORAL_TABLET | ORAL | Status: DC

## 2020-10-08 MED ORDER — PREDNISONE 10 MG (21) PO TBPK: 20.0000 mg | ORAL_TABLET | Freq: Every morning | ORAL | Status: DC

## 2020-10-08 MED ORDER — PREDNISONE 50 MG PO TABS
60.0000 mg | ORAL_TABLET | Freq: Once | ORAL | Status: AC
  Administered 2020-10-08: 60 mg via ORAL
  Filled 2020-10-08: qty 1

## 2020-10-08 MED ORDER — BUMETANIDE 0.25 MG/ML IJ SOLN
1.0000 mg | Freq: Two times a day (BID) | INTRAMUSCULAR | Status: DC
  Administered 2020-10-08: 1 mg via INTRAVENOUS
  Filled 2020-10-08 (×4): qty 4

## 2020-10-08 MED ORDER — PREDNISONE 10 MG PO TABS: 10.0000 mg | ORAL_TABLET | Freq: Every day | ORAL | Status: DC

## 2020-10-08 MED ORDER — PREDNISONE 10 MG (21) PO TBPK
20.0000 mg | ORAL_TABLET | Freq: Every evening | ORAL | Status: DC
Start: 2020-10-09 — End: 2020-10-08

## 2020-10-08 MED ORDER — PREDNISONE 50 MG PO TABS
50.0000 mg | ORAL_TABLET | Freq: Every day | ORAL | Status: AC
  Administered 2020-10-09: 50 mg via ORAL
  Filled 2020-10-08: qty 1

## 2020-10-08 MED ORDER — PREDNISONE 20 MG PO TABS: 20.0000 mg | ORAL_TABLET | Freq: Every day | ORAL | Status: DC

## 2020-10-08 MED ORDER — PREDNISONE 10 MG (21) PO TBPK: 10.0000 mg | ORAL_TABLET | Freq: Four times a day (QID) | ORAL | Status: DC

## 2020-10-08 MED ORDER — PREDNISONE 20 MG PO TABS: 40.0000 mg | ORAL_TABLET | Freq: Every day | ORAL | Status: DC

## 2020-10-08 MED ORDER — PREDNISONE 10 MG (21) PO TBPK: 10.0000 mg | ORAL_TABLET | Freq: Three times a day (TID) | ORAL | Status: DC

## 2020-10-08 MED ORDER — PREDNISONE 20 MG PO TABS: 30.0000 mg | ORAL_TABLET | Freq: Every day | ORAL | Status: DC

## 2020-10-08 MED ORDER — PREDNISONE 10 MG (21) PO TBPK: 20.0000 mg | ORAL_TABLET | Freq: Every evening | ORAL | Status: DC

## 2020-10-08 MED ORDER — PANTOPRAZOLE SODIUM 40 MG PO TBEC
40.0000 mg | DELAYED_RELEASE_TABLET | Freq: Two times a day (BID) | ORAL | Status: DC
  Administered 2020-10-08 – 2020-10-09 (×2): 40 mg via ORAL
  Filled 2020-10-08 (×2): qty 1

## 2020-10-08 NOTE — Progress Notes (Signed)
Pulmonary Medicine          Date: 10/08/2020,   MRN# [id number] Monica [name] 1934/04/22     AdmissionWeight: 108.9 kg                 CurrentWeight: 108.9 kg   Referring physician: Dr Denton Lank   CHIEF COMPLAINT:   Post-operative respiratory failure   HISTORY OF PRESENT ILLNESS   87 y.o. female with medical history significant for aortic stenosis, coronary artery disease, dyslipidemia and obesity who presents to the ER via EMS for evaluation following a mechanical fall.  Patientfell into the dresser landing on her left side.  She was unable to get up or bear weight on the left leg and her daughter who heard her fall called 911.  She denied feeling dizzy or lightheaded prior to the fall and denied head trauma or loss of consciousness.  Per EMS her left lower extremity was shortened. Patient denies having any chest pain, no shortness of breath, no palpitations, no diaphoresis, no nausea, no vomiting, no abdominal pain, no diarrhea, no constipation, no fever, no chills, no cough, no sore throat, no urinary frequency, no nocturia, no dysuria, no headache, no blurred vision, weakness or focal deficits.Left femur x-ray shows severely comminuted and displaced intertrochanteric fracture of proximal left femur  Orthopedic surgery was consulted in the ER and surgical repair is planned for 10/02/20. Patient was noted to have inreased oxygen requirement post operatively.CTPE was done and included in pictorial documentation below with findings of diffuse congestion with interstitial edema and prominent vascular pedicle and generous pulmonary arteries with WU:[id number] index >1 suggestive of PH as well as mild flattening and enlargement of RV size suggestive of RV strain.  There is absence of consolidated infiltrate to suggest pneumonia, there is only mild bibasilar atelectasis, no masses or significant nodules are present. The adenopathy noted here is significant and may be reactive to congestion  or infective/inflammatory process. Patient does appear to have chronic moderate normocytic anemia. She is with hypoalbuminemia which in some instances may be due to malnuritment when not in context of liver or renal failure and leads to additional third spacing of fluid. She has had transient episodes of hypoxemia overnight despite supplemental O2.  She has had episodes of hypotension overnight but mostly vitals have been in reference range.    09/2720- patient is examined at bedside and is improved.Daguther Cordelia Pen is at bedside and we discussed careplan.  She is weaned to 3L/min [place] from 4 and is 100% but can wean further. She did well with PT today but had leg pain after which makes sense since she just had surgery, overall seems to be recovering well. She has COPD so its taking her a bit longer. Will start low dose prednisone today.   PAST MEDICAL HISTORY   Past Medical History:  Diagnosis Date  . Aortic stenosis    a. LHC 06/02/17: At least moderate aortic stenosis with a peak to peak gradient of 22 mmHg; b. TTE 05/2017: EF 55-60%, mild HK basal-midinferior wall, Gr1DD, mod to sev AS w/ mean gradient 21 mmHg, valve area 0.99, mild MR, mildly dilated LA   . CAD (coronary artery disease) 2018   a. inferior STEMI 06/02/2017: LHC 06/02/17: LM 20, D1 20%, o-pLCx 50, p-mRCA 100% s/p PCI/DES, mRCA 50, dRCA 30  . GI bleed    a. noted 06/05/2017  . HLD (hyperlipidemia)   . Obesity      SURGICAL HISTORY   Past Surgical  History:  Procedure Laterality Date  . ANKLE RECONSTRUCTION  1956   also ORIF of right arm  . CORONARY STENT INTERVENTION N/A 06/01/2017   Procedure: Coronary/Graft Acute MI Revascularization;  Surgeon: Iran Ouch, MD;  Location: [facility] INVASIVE CV LAB;  Service: Cardiovascular;  Laterality: N/A;  . INTRAMEDULLARY (IM) NAIL INTERTROCHANTERIC Left 10/03/2020   Procedure: INTRAMEDULLARY (IM) NAIL INTERTROCHANTRIC;  Surgeon: Signa Kell, MD;  Location: [facility] ORS;  Service:  Orthopedics;  Laterality: Left;  . LEFT HEART CATH AND CORONARY ANGIOGRAPHY N/A 06/01/2017   Procedure: LEFT HEART CATH AND CORONARY ANGIOGRAPHY;  Surgeon: Iran Ouch, MD;  Location: [facility] INVASIVE CV LAB;  Service: Cardiovascular;  Laterality: N/A;     FAMILY HISTORY   Family History  Problem Relation Age of Onset  . Valvular heart disease Mother   . Diabetes Mellitus II Daughter      SOCIAL HISTORY   Social History   Tobacco Use  . Smoking status: Former Smoker    Packs/day: 0.50    Years: 50.00    Pack years: 25.00    Types: Cigarettes    Quit date: 06/01/2017    Years since quitting: 3.3  . Smokeless tobacco: Never Used  Vaping Use  . Vaping Use: Never used  Substance Use Topics  . Alcohol use: No  . Drug use: No     MEDICATIONS    Home Medication:  Current Outpatient Rx  . Order #: [id number] Class: Print  . Order #: [id number] Class: Print  . Order #: [id number] Class: Print    Current Medication:  Current Facility-Administered Medications:  .  acetaminophen (TYLENOL) tablet 1,000 mg, 1,000 mg, Oral, Q8H, Signa Kell, MD, 1,000 mg at 10/07/20 1449 .  aspirin EC tablet 81 mg, 81 mg, Oral, Daily, Signa Kell, MD, 81 mg at 10/08/20 1696 .  bisacodyl (DULCOLAX) EC tablet 5 mg, 5 mg, Oral, Daily PRN, Esaw Grandchild A, DO .  bisacodyl (DULCOLAX) suppository 10 mg, 10 mg, Rectal, Daily PRN, Signa Kell, MD .  Chlorhexidine Gluconate Cloth 2 % PADS 6 each, 6 each, Topical, Daily, Signa Kell, MD, 6 each at 10/07/20 [phone] .  docusate sodium (COLACE) capsule 100 mg, 100 mg, Oral, BID, Signa Kell, MD, 100 mg at 10/08/20 8101 .  enoxaparin (LOVENOX) injection 40 mg, 40 mg, Subcutaneous, Q24H, Signa Kell, MD, 40 mg at 10/08/20 7510 .  feeding supplement (ENSURE ENLIVE / ENSURE PLUS) liquid 237 mL, 237 mL, Oral, BID BM, Drema Dallas, MD, 237 mL at 10/08/20 1319 .  furosemide (LASIX) injection 20 mg, 20 mg, Intravenous, BID, Vida Rigger, MD, 20 mg at  10/08/20 0937 .  methocarbamol (ROBAXIN) tablet 500 mg, 500 mg, Oral, Q6H PRN, 500 mg at 10/05/20 0910 **OR** methocarbamol (ROBAXIN) 500 mg in dextrose 5 % 50 mL IVPB, 500 mg, Intravenous, Q6H PRN, Signa Kell, MD .  metoCLOPramide (REGLAN) tablet 5-10 mg, 5-10 mg, Oral, Q8H PRN **OR** metoCLOPramide (REGLAN) injection 5-10 mg, 5-10 mg, Intravenous, Q8H PRN, Signa Kell, MD .  multivitamin with minerals tablet 1 tablet, 1 tablet, Oral, Daily, Drema Dallas, MD, 1 tablet at 10/07/20 [phone] .  ondansetron (ZOFRAN) tablet 4 mg, 4 mg, Oral, Q6H PRN **OR** ondansetron (ZOFRAN) injection 4 mg, 4 mg, Intravenous, Q6H PRN, Signa Kell, MD .  oxyCODONE (Oxy IR/ROXICODONE) immediate release tablet 2.5-5 mg, 2.5-5 mg, Oral, Q3H PRN, Signa Kell, MD, 5 mg at 10/04/20 0853 .  oxyCODONE (Oxy IR/ROXICODONE) immediate release tablet 5-10 mg, 5-10 mg, Oral, Q4H PRN, Signa Kell,  MD, 10 mg at 10/07/20 2128 .  pantoprazole (PROTONIX) EC tablet 40 mg, 40 mg, Oral, BID, Chappell, Alex B, RPH .  polyethylene glycol (MIRALAX / GLYCOLAX) packet 17 g, 17 g, Oral, Daily, Esaw Grandchild A, DO, 17 g at 10/08/20 0932 .  rosuvastatin (CRESTOR) tablet 10 mg, 10 mg, Oral, Daily, Signa Kell, MD, 10 mg at 10/08/20 3557 .  senna-docusate (Senokot-S) tablet 1 tablet, 1 tablet, Oral, BID, Pennie Banter, DO, 1 tablet at 10/08/20 [phone] .  sodium phosphate (FLEET) 7-19 GM/118ML enema 1 enema, 1 enema, Rectal, Once PRN, Signa Kell, MD .  traMADol Janean Sark) tablet 50 mg, 50 mg, Oral, Q6H PRN, Signa Kell, MD, 50 mg at 10/06/20 1012 .  traZODone (DESYREL) tablet 50 mg, 50 mg, Oral, QHS, Esaw Grandchild A, DO, 50 mg at 10/07/20 2129    ALLERGIES   Patient has no known allergies.     REVIEW OF SYSTEMS    Review of Systems:  Gen:  Denies  fever, sweats, chills weigh loss  HEENT: Denies blurred vision, double vision, ear pain, eye pain, hearing loss, nose bleeds, sore throat Cardiac:  No dizziness, chest pain or  heaviness, chest tightness,edema Resp:   Denies cough or sputum porduction, shortness of breath,wheezing, hemoptysis,  Gi: Denies swallowing difficulty, stomach pain, nausea or vomiting, diarrhea, constipation, bowel incontinence Gu:  Denies bladder incontinence, burning urine Ext:   Denies Joint pain, stiffness or swelling Skin: Denies  skin rash, easy bruising or bleeding or hives Endoc:  Denies polyuria, polydipsia , polyphagia or weight change Psych:   Denies depression, insomnia or hallucinations   Other:  All other systems negative   VS: BP 105/62 (BP Location: Left Arm)   Pulse 98   Temp 98.3 F (36.8 C) (Oral)   Resp 19   Ht 5\' 5"  (1.651 m)   Wt 108.9 kg   SpO2 100%   BMI 39.94 kg/m      PHYSICAL EXAM    GENERAL:NAD, no fevers, chills, no weakness no fatigue HEAD: Normocephalic, atraumatic.  EYES: Pupils equal, round, reactive to light. Extraocular muscles intact. No scleral icterus.  MOUTH: Moist mucosal membrane. Dentition intact. No abscess noted.  EAR, NOSE, THROAT: Clear without exudates. No external lesions.  NECK: Supple. No thyromegaly. No nodules. No JVD.  PULMONARY: Diffuse coarse rhonchi right sided +wheezes CARDIOVASCULAR: S1 and S2. Regular rate and rhythm. No murmurs, rubs, or gallops. No edema. Pedal pulses 2+ bilaterally.  GASTROINTESTINAL: Soft, nontender, nondistended. No masses. Positive bowel sounds. No hepatosplenomegaly.  MUSCULOSKELETAL: No swelling, clubbing, or edema. Range of motion full in all extremities.  NEUROLOGIC: Cranial nerves II through XII are intact. No gross focal neurological deficits. Sensation intact. Reflexes intact.  SKIN: No ulceration, lesions, rashes, or cyanosis. Skin warm and dry. Turgor intact.  PSYCHIATRIC: Mood, affect within normal limits. The patient is awake, alert and oriented x 3. Insight, judgment intact.       IMAGING    CT ANGIO CHEST PE W OR WO CONTRAST  Result Date: 10/05/2020 CLINICAL DATA:   Elevated D-dimer, recent surgery EXAM: CT ANGIOGRAPHY CHEST WITH CONTRAST TECHNIQUE: Multidetector CT imaging of the chest was performed using the standard protocol during bolus administration of intravenous contrast. Multiplanar CT image reconstructions and MIPs were obtained to evaluate the vascular anatomy. CONTRAST:  78mL OMNIPAQUE IOHEXOL 350 MG/ML SOLN COMPARISON:  None. FINDINGS: Cardiovascular: Satisfactory opacification of the pulmonary arteries to the proximal segmental level. However, there is respiratory motion. Question of nonocclusive right basal subsegmental  and left lower lobe segmental and subsegmental filling defects. Enlargement of the main pulmonary artery measuring 3.7 cm suggesting pulmonary arterial hypertension. Cardiomegaly. Coronary artery calcification. No pericardial effusion. Thoracic aorta is normal in caliber with mixed plaque. Mediastinum/Nodes: Mediastinal adenopathy is present. A prevascular node on image 26 of series 4 measures 1.7 cm in short axis. Subcarinal node on image 42 also measures 2 cm in short axis. Nonenlarged hilar nodes. Small hiatal hernia. Thyroid is unremarkable. Lungs/Pleura: Imaging in expiration. Interstitial thickening and patchy ground-glass density. Bibasilar atelectasis. Upper Abdomen: No acute abnormality.  Cholelithiasis. Musculoskeletal: Degenerative changes of the spine. No acute osseous abnormality. Review of the MIP images confirms the above findings. IMPRESSION: No definite pulmonary embolism. Question of nonocclusive right basal subsegmental and left lower lobe segmental and subsegmental filling defects. There is significant respiratory motion and these could be artifactual. Bibasilar atelectasis. Interstitial thickening and patchy ground-glass density, which could reflect additional atelectasis (imaging in expiration), mild edema, or less likely atypical pneumonia. Mediastinal adenopathy. Cardiomegaly. Enlargement of the main pulmonary artery  suggesting pulmonary arterial hypertension. Electronically Signed   By: Guadlupe Spanish M.D.   On: 10/05/2020 15:06   DG Hand 2 View Left  Result Date: 10/02/2020 CLINICAL DATA:  Left hand pain after fall. EXAM: LEFT HAND - 2 VIEW COMPARISON:  None. FINDINGS: There is no evidence of fracture or dislocation. Moderate degenerative changes seen involving the first carpometacarpal joint. Soft tissues are unremarkable. 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 No acute abnormality seen in the left hand. Electronically Signed   By: Lupita Raider M.D.   On: 10/02/2020 09:52   US Venous Img Lower Bilateral (DVT)  Result Date: 10/06/2020 CLINICAL DATA:  Hypoxia, acute respiratory failure EXAM: BILATERAL LOWER EXTREMITY VENOUS DOPPLER ULTRASOUND TECHNIQUE: Gray-scale sonography with compression, as well as color and duplex ultrasound, were performed to evaluate the deep venous system(s) from the level of the common femoral vein through the popliteal and proximal calf veins. COMPARISON:  04/29/2018 FINDINGS: VENOUS Normal compressibility of the common femoral, superficial femoral, and popliteal veins, as well as the visualized calf veins. Visualized portions of profunda femoral vein and great saphenous vein unremarkable. No filling defects to suggest DVT on grayscale or color Doppler imaging. Doppler waveforms show normal direction of venous flow, normal respiratory phasicity and response to augmentation. Limited views of the contralateral common femoral vein are unremarkable. OTHER None. Limitations: Technologist describes technically difficult study secondary to swelling and pain in the region of the left femoral vein. IMPRESSION: No femoropopliteal DVT nor evidence of DVT within the visualized calf veins. If clinical symptoms are inconsistent or if there are persistent or worsening symptoms, further imaging (possibly involving the iliac veins) may be warranted. Electronically Signed    By: Corlis Leak M.D.   On: 10/06/2020 16:27   DG Chest Port 1 View  Result Date: 10/05/2020 CLINICAL DATA:  Acute respiratory failure EXAM: PORTABLE CHEST 1 VIEW COMPARISON:  10/02/2020 FINDINGS: Cardiac enlargement without heart failure. Prominent lung markings diffusely unchanged. No focal consolidation or effusion. Chronic right rib fractures. IMPRESSION: No interval change. Prominent interstitial lung markings bilaterally and diffusely could represent chronic lung disease however infection or interstitial edema possible. Electronically Signed   By: Marlan Palau M.D.   On: 10/05/2020 10:37   DG Chest Portable 1 View  Result Date: 10/02/2020 CLINICAL DATA:  Left hip fracture. EXAM: PORTABLE CHEST 1 VIEW COMPARISON:  June 05, 2017. FINDINGS: Mild cardiomegaly is noted. No pneumothorax pleural effusion  is noted. Increased diffuse interstitial densities are noted throughout both lungs which may represent diffuse scarring, but acute superimposed atypical inflammation or edema cannot be excluded. Old right rib fractures are noted. IMPRESSION: Increased diffuse interstitial densities are noted throughout both lungs which may represent diffuse scarring, but acute superimposed atypical inflammation or edema cannot be excluded. Electronically Signed   By: Lupita Raider M.D.   On: 10/02/2020 09:50   ECHOCARDIOGRAM COMPLETE  Result Date: 10/03/2020    ECHOCARDIOGRAM REPORT   Patient Name:   MILADY FLEENER Date of Exam: 10/03/2020 Medical Rec #:  [id number]      Height:       65.0 in Accession #:    [id number]     Weight:       240.0 lb Date of Birth:  1934/02/04      BSA:          2.138 m Patient Age:    86 years       BP:           95/71 mmHg Patient Gender: F              HR:           86 bpm. Exam Location:  [facility] Procedure: 2D Echo, Color Doppler, Cardiac Doppler and Intracardiac            Opacification Agent Indications:     I35.0 Aortic stenosis  History:         Patient has prior history of  Echocardiogram examinations, most                  recent 10/26/2019. CAD, Signs/Symptoms:Broken hip; Risk                  Factors:Dyslipidemia.  Sonographer:     Humphrey Rolls RDCS (AE) Referring Phys:  [id number] Sondra Barges Diagnosing Phys: Yvonne Kendall MD  Sonographer Comments: Suboptimal apical window. Image acquisition challenging due to patient body habitus. IMPRESSIONS  1. Left ventricular ejection fraction, by estimation, is 60 to 65%. The left ventricle has normal function. Left ventricular endocardial border not optimally defined to evaluate regional wall motion. There is mild left ventricular hypertrophy. Left ventricular diastolic parameters are consistent with Grade I diastolic dysfunction (impaired relaxation). Elevated left atrial pressure.  2. Right ventricular systolic function is normal. The right ventricular size is mildly enlarged. Tricuspid regurgitation signal is inadequate for assessing PA pressure.  3. Left atrial size was mildly dilated.  4. Right atrial size was mildly dilated.  5. The mitral valve is degenerative. Trivial mitral valve regurgitation. Mild mitral stenosis. Moderate mitral annular calcification.  6. Tricuspid valve regurgitation at least moderate but suboptimally visualized.  7. The aortic valve is tricuspid. There is moderate calcification of the aortic valve. There is severe thickening of the aortic valve. Aortic valve regurgitation is not visualized. Severe aortic valve stenosis. Aortic valve area, by VTI measures 0.51 cm. Aortic valve mean gradient measures 60.0 mmHg. Aortic valve Vmax measures 4.83 m/s.  8. The inferior vena cava is normal in size with greater than 50% respiratory variability, suggesting right atrial pressure of 3 mmHg. Comparison(s): Very severe aortic stenosis, worsened compared with prior echocardiogram from 10/26/2019. FINDINGS  Left Ventricle: Left ventricular ejection fraction, by estimation, is 60 to 65%. The left ventricle has normal function. Left  ventricular endocardial border not optimally defined to evaluate regional wall motion. Definity contrast agent was given IV to delineate the left ventricular endocardial borders.  The left ventricular internal cavity size was normal in size. There is mild left ventricular hypertrophy. Left ventricular diastolic parameters are consistent with Grade I diastolic dysfunction (impaired relaxation). Elevated left atrial pressure. Right Ventricle: The right ventricular size is mildly enlarged. No increase in right ventricular wall thickness. Right ventricular systolic function is normal. Tricuspid regurgitation signal is inadequate for assessing PA pressure. Left Atrium: Left atrial size was mildly dilated. Right Atrium: Right atrial size was mildly dilated. Pericardium: There is no evidence of pericardial effusion. Mitral Valve: The mitral valve is degenerative in appearance. There is mild thickening of the mitral valve leaflet(s). Moderate mitral annular calcification. Trivial mitral valve regurgitation. Mild mitral valve stenosis. MV peak gradient, 11.2 mmHg. The  mean mitral valve gradient is 5.0 mmHg. Tricuspid Valve: The tricuspid valve is not well visualized. Tricuspid valve regurgitation at least moderate but suboptimally visualized. Aortic Valve: The aortic valve is tricuspid. There is moderate calcification of the aortic valve. There is severe thickening of the aortic valve. Aortic valve regurgitation is not visualized. Severe aortic stenosis is present. Aortic valve mean gradient measures 60.0 mmHg. Aortic valve peak gradient measures 93.3 mmHg. Aortic valve area, by VTI measures 0.51 cm. Pulmonic Valve: The pulmonic valve was not well visualized. Pulmonic valve regurgitation is not visualized. No evidence of pulmonic stenosis. Aorta: The aortic root is normal in size and structure. Pulmonary Artery: The pulmonary artery is not well seen. Venous: The inferior vena cava is normal in size with greater than 50%  respiratory variability, suggesting right atrial pressure of 3 mmHg. IAS/Shunts: The interatrial septum was not well visualized.  LEFT VENTRICLE PLAX 2D LVIDd:         4.00 cm  Diastology LVIDs:         2.50 cm  LV e' medial:    8.38 cm/s LV PW:         1.36 cm  LV E/e' medial:  13.8 LV IVS:        1.09 cm  LV e' lateral:   5.44 cm/s LVOT diam:     1.90 cm  LV E/e' lateral: 21.3 LV SV:         51 LV SV Index:   24 LVOT Area:     2.84 cm  RIGHT VENTRICLE RV Basal diam:  4.60 cm LEFT ATRIUM           Index       RIGHT ATRIUM           Index LA diam:      3.90 cm 1.82 cm/m  RA Area:     23.00 cm LA Vol (A4C): 69.3 ml 32.42 ml/m RA Volume:   76.20 ml  35.65 ml/m  AORTIC VALVE                    PULMONIC VALVE AV Area (Vmax):    0.53 cm     PV Vmax:       1.23 m/s AV Area (Vmean):   0.53 cm     PV Vmean:      80.200 cm/s AV Area (VTI):     0.51 cm     PV VTI:        0.229 m AV Vmax:           483.00 cm/s  PV Peak grad:  6.1 mmHg AV Vmean:          346.000 cm/s PV Mean grad:  3.0 mmHg AV VTI:  1.007 m AV Peak Grad:      93.3 mmHg AV Mean Grad:      60.0 mmHg LVOT Vmax:         89.50 cm/s LVOT Vmean:        64.100 cm/s LVOT VTI:          0.181 m LVOT/AV VTI ratio: 0.18  AORTA Ao Root diam: 3.40 cm MITRAL VALVE MV Area (PHT): 7.29 cm     SHUNTS MV Area VTI:   1.93 cm     Systemic VTI:  0.18 m MV Peak grad:  11.2 mmHg    Systemic Diam: 1.90 cm MV Mean grad:  5.0 mmHg MV Vmax:       1.67 m/s MV Vmean:      102.0 cm/s MV Decel Time: 104 msec MV E velocity: 116.00 cm/s MV A velocity: 164.00 cm/s MV E/A ratio:  0.71 Cristal Deer End MD Electronically signed by Yvonne Kendall MD Signature Date/Time: 10/03/2020/9:54:38 AM    Final    DG HIP OPERATIVE UNILAT W OR W/O PELVIS LEFT  Result Date: 10/03/2020 CLINICAL DATA:  Intramedullary nail on the LEFT. EXAM: OPERATIVE LEFT HIP (WITH PELVIS IF PERFORMED)  VIEWS TECHNIQUE: Fluoroscopic spot image(s) were submitted for interpretation post-operatively. COMPARISON:   October 02, 2020 FINDINGS: Six intraoperative fluoroscopic views with changes of intramedullary rod placement and screw fixation across the femoral neck spanning a comminuted intertrochanteric fracture of the LEFT proximal femur. Midportion of the device is not imaged. Distal interlocking screws of been placed in the distal femur within the metadiaphyseal region. Proximally there is a compression type screw in a cannulated lag screw in place across the femoral neck. Persistent distraction of the lesser trochanteric fragment. IMPRESSION: Post ORIF of LEFT intertrochanteric fracture with intramedullary nailing and fixation devices as described. Midportion of the devices are not imaged.  Attention on follow-up. Electronically Signed   By: Donzetta Kohut M.D.   On: 10/03/2020 22:21   DG Hip Unilat W or Wo Pelvis 2-3 Views Left  Result Date: 10/02/2020 CLINICAL DATA:  Left hip pain after fall. EXAM: DG HIP (WITH OR WITHOUT PELVIS) 2-3V LEFT COMPARISON:  None. FINDINGS: Severely comminuted and displaced fracture is seen involving the intertrochanteric region of the proximal left femur. IMPRESSION: Severely comminuted and displaced intertrochanteric fracture of proximal left femur. Electronically Signed   By: Lupita Raider M.D.   On: 10/02/2020 09:49   US Abdomen Limited RUQ (LIVER/GB)  Result Date: 10/07/2020 CLINICAL DATA:  Hyperbilirubinemia EXAM: ULTRASOUND ABDOMEN LIMITED RIGHT UPPER QUADRANT COMPARISON:  None. FINDINGS: Gallbladder: Multiple mobile gallstones measuring up to 1.7 cm. No pericholecystic fluid or wall thickening visualized. No sonographic Murphy sign noted by sonographer. Common bile duct: Diameter: 7 mm which is within normal limits for patient's age. Liver: No focal lesion identified. Within normal limits in parenchymal echogenicity. Portal vein is patent on color Doppler imaging with normal direction of blood flow towards the liver. Other: None. IMPRESSION: Cholelithiasis without  sonographic evidence of acute cholecystitis. Electronically Signed   By: Maudry Mayhew MD   On: 10/07/2020 14:39      ASSESSMENT/PLAN   Acute hypoxemic respiratory failure  - patient has cardiac history including severe AS, CAD s/p MI and PCI, however new worsening TTE with new findings of RV dilation and LA abnormalities as well as findings of pulmonary hypertension, this may be due to PE vs development of pulmonary hypertension.  I would recommend formal right heart catheterization.  Further possibility of PE  has been evaluated with PE however not definitive due to motion.  I would like to investigate this further and will consider vascular surgery input.  Additionally will obtain ABG with shunt fraction. Occasionally patients may develop bone marrow emboli post long bone fractures.  She smoked in the past but denies COPD or emphysema history and has never used inhaler previously or supplemental oxygen therapy.  - She was not sick prior to fall will hold off on further testing for infection.   - Patient does have component of edema and is being diuresed, although fluid balance is documented as +900 cc she has >1L urine in canister above her head. -CT above does have further evidence of Pulmonary hypertension and I suspect this may be acute or acute on chronic >> ABG, lasix, diuresis, cardiology consult -Anemia is contributing to dyspnea and she had GI bleed last year, currently on DVT ppx with lovenox, will order Fecal occult stool -She does do PT with OOB to chair but is deconditioned, will order Incentive spirometer and will hold off on Vest therapy due to fracture and will consider chest PT with Metaneb if does not improve   Probable Pulmonary hypertension  - will hold off on pulmonary vasolidatory at this time  - cardiology consult - request for [facility] to delineate pre/post capillary pulmonary pressures  -diuresis - lasix 20 bid with albumin 25% 1 amp daily IV -ABG -possible further workup  for PE or BM embolism syndrome post long bone fracture - will think about V/Q vs vascular surgery eval ?. , will obtain ddimer and ABG in the interim  - PT/OT/CPT    Mild protein calorie malnutrition - decreased peripheral muscle mass with  low albumin  -contributing to third spacing of fluid and dyspnea - RD dietary consultation and continue PT/OT/CPT    Cholelithiasis  US liver no acute cholecystitis - patient denies abd pain.   Thank you for allowing me to participate in the care of this patient.   Patient/Family are satisfied with care plan and all questions have been answered.  This document was prepared using Dragon voice recognition software and may include unintentional dictation errors.     Vida RiggerFuad Tianah Lonardo, M.D.  Division of Pulmonary & Critical Care Medicine  [facility]

## 2020-10-08 NOTE — Progress Notes (Addendum)
PROGRESS NOTE    Monica [name]   ZOX:[id number]  DOB: September 22, 1933  PCP: Patient, No Pcp Per    DOA: 10/02/2020 LOS: 6   Brief Narrative   87 y.o. WF PMHx aortic stenosis, CAD, dyslipidemia, obesity who presented to the ED on 10/03/20 via EMS after a mechanical fall at home resulting in a LEFT proximal femur fracture.  Patient reported the fall occurred when she lost her balance walking around her bed to turn up the thermostat, falling into a dresser and landing on her left side.    Admitted to the hospitalist service with orthopedics consulted.  Patient underwent surgery on afternoon of 2/22, with intramedullary nailing of the LEFT femur.  Patient evaluated by PT and OT postoperatively, and SNF for short-term rehab was recommended after discharge.  TOC is following and placement is underway.  Admission has been prolonged due to persistent hypoxia.  Patient does not typically require supplemental oxygen, has need ~4 l/min since surgery.  Evaluation underway and pulmonary consulted.    Assessment & Plan   Principal Problem:   Closed comminuted intertrochanteric fracture of proximal end of left femur (HCC) Active Problems:   Acute respiratory failure with hypoxia (HCC)   Aortic stenosis   Obesity   CAD (coronary artery disease)   Severe aortic stenosis   Left hip fracture -status post surgical repair on 2/22.  --PT and OT recommend SNF, TOC following for placement --Lovenox, foot pumps, TED hose for DVT prophylaxis --Toe-touch weightbearing on the left lower extremity --Follow-up in [facility] Ortho clinic 2 weeks for staple removal and x-ray --Analgesics as needed --Bowel regimen  Acute respiratory failure with hypoxia - has been requiring supplemental oxygen of 4-6 L/min in postop setting.   D-dimer elevated.  High risk for PE in setting of postop hip repair, immobility. 2/28: weaned 4>>3 L/min --Titrate O2 for sats > 88%, wean as tolerated --CTA chest is pending to evaluate for  PE --Incentive spirometry --Pulmonology consulted, refer to their recommendations    Added prednisone, recommend RHC  --Will likely d/c with oxygen  Pulmonary Hypertension - as per CTA chest this admission.  Cardiology following , informed pulmonology would like RHC.   Severe aortic stenosis -cardiology consulted.  Blood pressures low/normal. --Coreg stopped --Outpatient follow-up recommended for TAVR evaluation  Coronary artery disease / History of inferior MI status post PCI -stable.  EF preserved. --Continue aspirin and statin --Beta-blocker has been stopped as above   Obesity: Body mass index is 39.94 kg/m.  Complicates overall care and prognosis.  DVT prophylaxis: enoxaparin (LOVENOX) injection 40 mg Start: 10/04/20 0800 SCDs Start: 10/03/20 1908   Diet:  Diet Orders (From admission, onward)    Start     Ordered   10/07/20 1449  Diet regular Room service appropriate? Yes; Fluid consistency: Thin  Diet effective now       Question Answer Comment  Room service appropriate? Yes   Fluid consistency: Thin      10/07/20 1448            Code Status: Full Code    Subjective 10/08/20    Patient reports feeling bad since starting Lasix - weak, groggy, no appetite at all.  Onset with start on Lasix.  Says she felt good this AM, since she got Lasix has felt much worse.     Disposition Plan & Communication   Status is: Inpatient  Remains inpatient appropriate because:Inpatient level of care appropriate due to severity of illness.  Patient continues require supplemental  oxygen, evaluation of this is underway.  Weaning oxygen.   Dispo: The patient is from: Home              Anticipated d/c is to: SNF              Anticipated d/c date is: 2 days              Patient currently is not medically stable to d/c.   Difficult to place patient No  Family Communication: Daughter at bedside on rounds today   Consults, Procedures, Significant Events   Consultants:    Orthopedics  Cardiology  Procedures:   IM nail fixation of left intertrochanteric femur fracture 2/22  Antimicrobials:  Anti-infectives (From admission, onward)   Start     Dose/Rate Route Frequency Ordered Stop   10/03/20 2000  ceFAZolin (ANCEF) IVPB 2g/100 mL premix        2 g 200 mL/hr over 30 Minutes Intravenous Every 6 hours 10/03/20 1907 10/04/20 1124        Micro    Objective   Vitals:   10/08/20 0502 10/08/20 0810 10/08/20 1137 10/08/20 1516  BP: (!) 103/49 107/72 105/62 (!) 101/55  Pulse: (!) 102 100 98 (!) 101  Resp: Temp: 98.9 F (37.2 C) 99.4 F (37.4 C) 98.3 F (36.8 C) 98.2 F (36.8 C)  TempSrc: Oral  Oral   SpO2: 95% 95% 100% 95%  Weight:      Height:        Intake/Output Summary (Last 24 hours) at 10/08/2020 1538 Last data filed at 10/08/2020 1300 Gross per 24 hour  Intake 240 ml  Output 3100 ml  Net -2860 ml   Filed Weights   10/02/20 0859  Weight: 108.9 kg    Physical Exam:  General exam: awake, alert, no acute distress, obese Respiratory system: CTAB, no wheezes, rales or rhonchi, normal respiratory effort, on 4 L/min nasal cannula oxygen. Cardiovascular system: normal S1/S2, RRR, no pedal edema.   Central nervous system: A&O x3. no gross focal neurologic deficits, normal speech Extremities: moves all,, no cyanosis, normal tone Psychiatry: normal mood, congruent affect, judgement and insight appear normal  Labs   Data Reviewed: I have personally reviewed following labs and imaging studies  CBC: Recent Labs  Lab 10/04/20 0423 10/05/20 0441 10/06/20 0434 10/06/20 1249 10/07/20 0345 10/08/20 0555  WBC 12.3* 10.4 11.3*  --  10.8* 9.4  NEUTROABS 10.3* 6.8 6.3  --  6.0 5.5  HGB 9.8* 8.5* 7.8* 8.2* 8.4* 7.8*  HCT 31.0* 25.8* 25.1* 26.5* 26.8* 24.8*  MCV 88.8 86.9 88.7  --  89.3 88.3  PLT 145* 145* 163  --  196 223   Basic Metabolic Panel: Recent Labs  Lab 10/04/20 0423 10/05/20 0441 10/06/20 0434  10/07/20 0345 10/08/20 0555  NA 132* 134* 134* 136 132*  K 4.7 4.8 4.9 4.8 4.4  CL 102 102 101 99 95*  CO2 GLUCOSE 129* 120* 118* 119* 116*  BUN 19 26* CREATININE 0.91 0.88 0.75 0.68 0.80  CALCIUM 7.9* 7.9* 8.2* 8.4* 8.2*  MG 1.9 2.2  --   --   --   PHOS 3.2 2.8  --   --   --    GFR: Estimated Creatinine Clearance: 64.3 mL/min (by C-G formula based on SCr of 0.8 mg/dL). Liver Function Tests: Recent Labs  Lab 10/04/20 0423 10/05/20 0441 10/06/20 0434 10/07/20 0345 10/08/20 2956  10/08/20 1000  AST 39 43* 38 42* 38  --   ALT --   ALKPHOS 46 42 46 53 54  --   BILITOT 1.5* 1.6* 1.9* 2.4* 2.7* 3.0*  PROT 5.9* 5.7* 6.0* 6.2* 6.1*  --   ALBUMIN 2.6* 2.6* 2.5* 2.6* 2.7*  --    No results for input(s): LIPASE, AMYLASE in the last 168 hours. No results for input(s): AMMONIA in the last 168 hours. Coagulation Profile: Recent Labs  Lab 10/02/20 1153 10/08/20 1000  INR 1.1 1.1   Cardiac Enzymes: No results for input(s): CKTOTAL, CKMB, CKMBINDEX, TROPONINI in the last 168 hours. BNP (last 3 results) No results for input(s): PROBNP in the last 8760 hours. HbA1C: No results for input(s): HGBA1C in the last 72 hours. CBG: No results for input(s): GLUCAP in the last 168 hours. Lipid Profile: No results for input(s): CHOL, HDL, LDLCALC, TRIG, CHOLHDL, LDLDIRECT in the last 72 hours. Thyroid Function Tests: No results for input(s): TSH, T4TOTAL, FREET4, T3FREE, THYROIDAB in the last 72 hours. Anemia Panel: No results for input(s): VITAMINB12, FOLATE, FERRITIN, TIBC, IRON, RETICCTPCT in the last 72 hours. Sepsis Labs: Recent Labs  Lab 10/05/20 0441 10/06/20 0434 10/07/20 0345  PROCALCITON 0.26 0.15 0.15    Recent Results (from the past 240 hour(s))  Resp Panel by RT-PCR (Flu A&B, Covid) Nasopharyngeal Swab     Status: None   Collection Time: 10/02/20  9:07 AM   Specimen: Nasopharyngeal Swab; Nasopharyngeal(NP) swabs in vial  transport medium  Result Value Ref Range Status   SARS Coronavirus 2 by RT PCR NEGATIVE NEGATIVE Final    Comment: (NOTE) SARS-CoV-2 target nucleic acids are NOT DETECTED.  The SARS-CoV-2 RNA is generally detectable in upper respiratory specimens during the acute phase of infection. The lowest concentration of SARS-CoV-2 viral copies this assay can detect is 138 copies/mL. A negative result does not preclude SARS-Cov-2 infection and should not be used as the sole basis for treatment or other patient management decisions. A negative result may occur with  improper specimen collection/handling, submission of specimen other than nasopharyngeal swab, presence of viral mutation(s) within the areas targeted by this assay, and inadequate number of viral copies(<138 copies/mL). A negative result must be combined with clinical observations, patient history, and epidemiological information. The expected result is Negative.  Fact Sheet for Patients:  BloggerCourse.com  Fact Sheet for Healthcare Providers:  SeriousBroker.it  This test is no t yet approved or cleared by the [place] FDA and  has been authorized for detection and/or diagnosis of SARS-CoV-2 by FDA under an Emergency Use Authorization (EUA). This EUA will remain  in effect (meaning this test can be used) for the duration of the COVID-19 declaration under Section 564(b)(1) of the Act, 21 U.S.C.section 360bbb-3(b)(1), unless the authorization is terminated  or revoked sooner.       Influenza A by PCR NEGATIVE NEGATIVE Final   Influenza B by PCR NEGATIVE NEGATIVE Final    Comment: (NOTE) The Xpert Xpress SARS-CoV-2/FLU/RSV plus assay is intended as an aid in the diagnosis of influenza from Nasopharyngeal swab specimens and should not be used as a sole basis for treatment. Nasal washings and aspirates are unacceptable for Xpert Xpress SARS-CoV-2/FLU/RSV testing.  Fact  Sheet for Patients: BloggerCourse.com  Fact Sheet for Healthcare Providers: SeriousBroker.it  This test is not yet approved or cleared by the [place] FDA and has been authorized for detection and/or diagnosis of SARS-CoV-2 by FDA under an  Emergency Use Authorization (EUA). This EUA will remain in effect (meaning this test can be used) for the duration of the COVID-19 declaration under Section 564(b)(1) of the Act, 21 U.S.C. section 360bbb-3(b)(1), unless the authorization is terminated or revoked.  Performed at [facility], [address] [place], [place] [address]       Imaging Studies   US Venous Img Lower Bilateral (DVT)  Result Date: 10/06/2020 CLINICAL DATA:  Hypoxia, acute respiratory failure EXAM: BILATERAL LOWER EXTREMITY VENOUS DOPPLER ULTRASOUND TECHNIQUE: Gray-scale sonography with compression, as well as color and duplex ultrasound, were performed to evaluate the deep venous system(s) from the level of the common femoral vein through the popliteal and proximal calf veins. COMPARISON:  04/29/2018 FINDINGS: VENOUS Normal compressibility of the common femoral, superficial femoral, and popliteal veins, as well as the visualized calf veins. Visualized portions of profunda femoral vein and great saphenous vein unremarkable. No filling defects to suggest DVT on grayscale or color Doppler imaging. Doppler waveforms show normal direction of venous flow, normal respiratory phasicity and response to augmentation. Limited views of the contralateral common femoral vein are unremarkable. OTHER None. Limitations: Technologist describes technically difficult study secondary to swelling and pain in the region of the left femoral vein. IMPRESSION: No femoropopliteal DVT nor evidence of DVT within the visualized calf veins. If clinical symptoms are inconsistent or if there are persistent or worsening symptoms, further imaging  (possibly involving the iliac veins) may be warranted. Electronically Signed   By: Corlis Leak M.D.   On: 10/06/2020 16:27   US Abdomen Limited RUQ (LIVER/GB)  Result Date: 10/07/2020 CLINICAL DATA:  Hyperbilirubinemia EXAM: ULTRASOUND ABDOMEN LIMITED RIGHT UPPER QUADRANT COMPARISON:  None. FINDINGS: Gallbladder: Multiple mobile gallstones measuring up to 1.7 cm. No pericholecystic fluid or wall thickening visualized. No sonographic Murphy sign noted by sonographer. Common bile duct: Diameter: 7 mm which is within normal limits for patient's age. Liver: No focal lesion identified. Within normal limits in parenchymal echogenicity. Portal vein is patent on color Doppler imaging with normal direction of blood flow towards the liver. Other: None. IMPRESSION: Cholelithiasis without sonographic evidence of acute cholecystitis. Electronically Signed   By: Maudry Mayhew MD   On: 10/07/2020 14:39     Medications   Scheduled Meds: . acetaminophen  1,000 mg Oral Q8H  . aspirin EC  81 mg Oral Daily  . Chlorhexidine Gluconate Cloth  6 each Topical Daily  . docusate sodium  100 mg Oral BID  . enoxaparin (LOVENOX) injection  40 mg Subcutaneous Q24H  . feeding supplement  237 mL Oral BID BM  . furosemide  20 mg Intravenous BID  . multivitamin with minerals  1 tablet Oral Daily  . pantoprazole  40 mg Oral BID  . polyethylene glycol  17 g Oral Daily  . [START ON 10/09/2020] predniSONE  50 mg Oral Q breakfast   Followed by  . [START ON 10/10/2020] predniSONE  40 mg Oral Q breakfast   Followed by  . [START ON 10/11/2020] predniSONE  30 mg Oral Q breakfast   Followed by  . [START ON 10/12/2020] predniSONE  20 mg Oral Q breakfast   Followed by  . [START ON 10/13/2020] predniSONE  10 mg Oral Q breakfast  . rosuvastatin  10 mg Oral Daily  . senna-docusate  1 tablet Oral BID  . traZODone  50 mg Oral QHS   Continuous Infusions: . methocarbamol (ROBAXIN) IV         LOS: 6 days  Time spent: 30 minutes with  greater than 30% spent in coordination of care and at bedside    Pennie BanterKelly A Brizeyda Holtmeyer, DO [facility]  10/08/2020, 3:38 PM      If 7PM-7AM, please contact night-coverage. How to contact the [facility] Attending or Consulting provider 7A - 7P or covering provider during after hours 7P -7A, for this patient?    1. Check the care team in [facility] and look for a) attending/consulting [facility] provider listed and b) the [facility] team listed 2. Log into [email or web address] and use [facility]'s universal password to access. If you do not have the password, please contact the hospital operator. 3. Locate the [facility] provider you are looking for under [facility] and page to a number that you can be directly reached. 4. If you still have difficulty reaching the provider, please page the [facility] (Director on Call) for the Hospitalists listed on amion for assistance.  PROGRESS NOTE    Monica ArmourSylvia Algeo   ZOX:[id number]  DOB: September 17, 1933  PCP: Patient, No Pcp Per    DOA: 10/02/2020 LOS: 6   Brief Narrative   87 y.o. WF PMHx aortic stenosis, CAD, dyslipidemia, obesity who presented to the ED on 10/03/20 via EMS after a mechanical fall at home resulting in a LEFT proximal femur fracture.  Patient reported the fall occurred when she lost her balance walking around her bed to turn up the thermostat, falling into a dresser and landing on her left side.    Admitted to the hospitalist service with orthopedics consulted.  Patient underwent surgery on afternoon of 2/22, with intramedullary nailing of the LEFT femur.  Patient evaluated by PT and OT postoperatively, and SNF for short-term rehab was recommended after discharge.  TOC is following and placement is underway.  Admission has been prolonged due to persistent hypoxia.  Patient does not typically require supplemental oxygen, has need ~4 l/min since surgery.  Evaluation underway and pulmonary consulted.    Assessment & Plan   Principal Problem:   Closed comminuted  intertrochanteric fracture of proximal end of left femur (HCC) Active Problems:   Acute respiratory failure with hypoxia (HCC)   Aortic stenosis   Obesity   CAD (coronary artery disease)   Severe aortic stenosis   Left hip fracture -status post surgical repair on 2/22.  --PT and OT recommend SNF, TOC following for placement --Lovenox, foot pumps, TED hose for DVT prophylaxis --Toe-touch weightbearing on the left lower extremity --[facility] 2 weeks for staple removal and x-ray --Analgesics as needed --Bowel regimen  Acute respiratory failure with hypoxia - has been requiring supplemental oxygen of 4-6 L/min in postop setting.   D-dimer elevated.  High risk for PE in setting of postop hip repair, immobility.   CTA chest on 2/24 had a lot of artifact, no definitive PE, did show enlarged main pulmonary artery consistent with pulmonary HTN --Lower extremity dopplers pending (tomorrow per pt request) --Pulmonology consulted, appreciate recs --Titrate O2 for sats > 88%, wean as tolerated --Incentive spirometry  Acute blood loss anemia - Hbg has trended down over past few days.  Hbg this AM is 7.8 (was 13.9 pre-op).  No evidence of bleeding. --Follow H&H / CBC --Transfuse pRBCs if Hbg < 7.0 --Low threshold to stop Lovenox, but high risk for VTE so continue with caution for now --Anemia panel with AM labs --Consider iron infusion --IV Protonix BID for now  Severe aortic stenosis -cardiology consulted.  Blood pressures low/normal. --Coreg stopped --Outpatient follow-up recommended  for TAVR evaluation  Coronary artery disease / History of inferior MI status post PCI -stable.  EF preserved. --Continue aspirin and statin --Beta-blocker has been stopped as above   Obesity: Body mass index is 39.94 kg/m.  Complicates overall care and prognosis.  DVT prophylaxis: enoxaparin (LOVENOX) injection 40 mg Start: 10/04/20 0800 SCDs Start: 10/03/20 1908   Diet:  Diet  Orders (From admission, onward)    Start     Ordered   10/07/20 1449  Diet regular Room service appropriate? Yes; Fluid consistency: Thin  Diet effective now       Question Answer Comment  Room service appropriate? Yes   Fluid consistency: Thin      10/07/20 1448            Code Status: Full Code    Subjective 10/08/20    Patient getting ready to go to ultrasound.  Seen with sister at bedside.  Patient seen by pulmonology and we discussed their recommendations, she seems not to fully understand.  Denies fever/chills.  Says she's felt better, but has no specific complaints.  Hip pain okay, but she's very uncomfortable in the bed.   Disposition Plan & Communication   Status is: Inpatient  Remains inpatient appropriate because:Inpatient level of care appropriate due to severity of illness.  Patient continues require supplemental oxygen, evaluation of this is underway.  Weaning oxygen.  Pulmonology consulted.  Also monitoring acute anemia.   Dispo: The patient is from: Home              Anticipated d/c is to: SNF              Anticipated d/c date is: 2-3 days              Patient currently is not medically stable to d/c.   Difficult to place patient No  Family Communication: Daughter at bedside on rounds today   Consults, Procedures, Significant Events   Consultants:   Orthopedics  Cardiology  Pulmonology  Procedures:   IM nail fixation of left intertrochanteric femur fracture 2/22  Antimicrobials:  Anti-infectives (From admission, onward)   Start     Dose/Rate Route Frequency Ordered Stop   10/03/20 2000  ceFAZolin (ANCEF) IVPB 2g/100 mL premix        2 g 200 mL/hr over 30 Minutes Intravenous Every 6 hours 10/03/20 1907 10/04/20 1124        Micro    Objective   Vitals:   10/08/20 0502 10/08/20 0810 10/08/20 1137 10/08/20 1516  BP: (!) 103/49 107/72 105/62 (!) 101/55  Pulse: (!) 102 100 98 (!) 101  Resp: 17 17 19 17   Temp: 98.9 F (37.2 C) 99.4 F  (37.4 C) 98.3 F (36.8 C) 98.2 F (36.8 C)  TempSrc: Oral  Oral   SpO2: 95% 95% 100% 95%  Weight:      Height:        Intake/Output Summary (Last 24 hours) at 10/08/2020 1538 Last data filed at 10/08/2020 1300 Gross per 24 hour  Intake 240 ml  Output 3100 ml  Net -2860 ml   Filed Weights   10/02/20 0859  Weight: 108.9 kg    Physical Exam:  General exam: up in chair, awake, alert, no acute distress, obese Respiratory system: CTAB diminished bases, symmetric chest rise, on 4 L/min nasal cannula oxygen, no respiratory distress, shallow inspirations. Cardiovascular system: RRR, no peripheral edema   Neurologic: normal speech, CN's gross intact Extremities: shins tender on palpation, no LE  edema   Labs   Data Reviewed: I have personally reviewed following labs and imaging studies  CBC: Recent Labs  Lab 10/04/20 0423 10/05/20 0441 10/06/20 0434 10/06/20 1249 10/07/20 0345 10/08/20 0555  WBC 12.3* 10.4 11.3*  --  10.8* 9.4  NEUTROABS 10.3* 6.8 6.3  --  6.0 5.5  HGB 9.8* 8.5* 7.8* 8.2* 8.4* 7.8*  HCT 31.0* 25.8* 25.1* 26.5* 26.8* 24.8*  MCV 88.8 86.9 88.7  --  89.3 88.3  PLT 145* 145* 163  --  196 223   Basic Metabolic Panel: Recent Labs  Lab 10/04/20 0423 10/05/20 0441 10/06/20 0434 10/07/20 0345 10/08/20 0555  NA 132* 134* 134* 136 132*  K 4.7 4.8 4.9 4.8 4.4  CL 102 102 101 99 95*  CO2 25 27 28 30 30   GLUCOSE 129* 120* 118* 119* 116*  BUN 19 26* 20 16 19   CREATININE 0.91 0.88 0.75 0.68 0.80  CALCIUM 7.9* 7.9* 8.2* 8.4* 8.2*  MG 1.9 2.2  --   --   --   PHOS 3.2 2.8  --   --   --    GFR: Estimated Creatinine Clearance: 64.3 mL/min (by C-G formula based on SCr of 0.8 mg/dL). Liver Function Tests: Recent Labs  Lab 10/04/20 0423 10/05/20 0441 10/06/20 0434 10/07/20 0345 10/08/20 0555 10/08/20 1000  AST 39 43* 38 42* 38  --   ALT 12 9 9 17 18   --   ALKPHOS 46 42 46 53 54  --   BILITOT 1.5* 1.6* 1.9* 2.4* 2.7* 3.0*  PROT 5.9* 5.7* 6.0* 6.2* 6.1*   --   ALBUMIN 2.6* 2.6* 2.5* 2.6* 2.7*  --    No results for input(s): LIPASE, AMYLASE in the last 168 hours. No results for input(s): AMMONIA in the last 168 hours. Coagulation Profile: Recent Labs  Lab 10/02/20 1153 10/08/20 1000  INR 1.1 1.1   Cardiac Enzymes: No results for input(s): CKTOTAL, CKMB, CKMBINDEX, TROPONINI in the last 168 hours. BNP (last 3 results) No results for input(s): PROBNP in the last 8760 hours. HbA1C: No results for input(s): HGBA1C in the last 72 hours. CBG: No results for input(s): GLUCAP in the last 168 hours. Lipid Profile: No results for input(s): CHOL, HDL, LDLCALC, TRIG, CHOLHDL, LDLDIRECT in the last 72 hours. Thyroid Function Tests: No results for input(s): TSH, T4TOTAL, FREET4, T3FREE, THYROIDAB in the last 72 hours. Anemia Panel: No results for input(s): VITAMINB12, FOLATE, FERRITIN, TIBC, IRON, RETICCTPCT in the last 72 hours. Sepsis Labs: Recent Labs  Lab 10/05/20 0441 10/06/20 0434 10/07/20 0345  PROCALCITON 0.26 0.15 0.15    Recent Results (from the past 240 hour(s))  Resp Panel by RT-PCR (Flu A&B, Covid) Nasopharyngeal Swab     Status: None   Collection Time: 10/02/20  9:07 AM   Specimen: Nasopharyngeal Swab; Nasopharyngeal(NP) swabs in vial transport medium  Result Value Ref Range Status   SARS Coronavirus 2 by RT PCR NEGATIVE NEGATIVE Final    Comment: (NOTE) SARS-CoV-2 target nucleic acids are NOT DETECTED.  The SARS-CoV-2 RNA is generally detectable in upper respiratory specimens during the acute phase of infection. The lowest concentration of SARS-CoV-2 viral copies this assay can detect is 138 copies/mL. A negative result does not preclude SARS-Cov-2 infection and should not be used as the sole basis for treatment or other patient management decisions. A negative result may occur with  improper specimen collection/handling, submission of specimen other than nasopharyngeal swab, presence of viral mutation(s) within  the areas targeted  by this assay, and inadequate number of viral copies(<138 copies/mL). A negative result must be combined with clinical observations, patient history, and epidemiological information. The expected result is Negative.  Fact Sheet for Patients:  BloggerCourse.com  Fact Sheet for Healthcare Providers:  SeriousBroker.it  This test is no t yet approved or cleared by the [place] FDA and  has been authorized for detection and/or diagnosis of SARS-CoV-2 by FDA under an Emergency Use Authorization (EUA). This EUA will remain  in effect (meaning this test can be used) for the duration of the COVID-19 declaration under Section 564(b)(1) of the Act, 21 U.S.C.section 360bbb-3(b)(1), unless the authorization is terminated  or revoked sooner.       Influenza A by PCR NEGATIVE NEGATIVE Final   Influenza B by PCR NEGATIVE NEGATIVE Final    Comment: (NOTE) The Xpert Xpress SARS-CoV-2/FLU/RSV plus assay is intended as an aid in the diagnosis of influenza from Nasopharyngeal swab specimens and should not be used as a sole basis for treatment. Nasal washings and aspirates are unacceptable for Xpert Xpress SARS-CoV-2/FLU/RSV testing.  Fact Sheet for Patients: BloggerCourse.com  Fact Sheet for Healthcare Providers: SeriousBroker.it  This test is not yet approved or cleared by the [place] FDA and has been authorized for detection and/or diagnosis of SARS-CoV-2 by FDA under an Emergency Use Authorization (EUA). This EUA will remain in effect (meaning this test can be used) for the duration of the COVID-19 declaration under Section 564(b)(1) of the Act, 21 U.S.C. section 360bbb-3(b)(1), unless the authorization is terminated or revoked.  Performed at [facility], [address] [place], [place] [address]       Imaging Studies   US Venous Img Lower  Bilateral (DVT)  Result Date: 10/06/2020 CLINICAL DATA:  Hypoxia, acute respiratory failure EXAM: BILATERAL LOWER EXTREMITY VENOUS DOPPLER ULTRASOUND TECHNIQUE: Gray-scale sonography with compression, as well as color and duplex ultrasound, were performed to evaluate the deep venous system(s) from the level of the common femoral vein through the popliteal and proximal calf veins. COMPARISON:  04/29/2018 FINDINGS: VENOUS Normal compressibility of the common femoral, superficial femoral, and popliteal veins, as well as the visualized calf veins. Visualized portions of profunda femoral vein and great saphenous vein unremarkable. No filling defects to suggest DVT on grayscale or color Doppler imaging. Doppler waveforms show normal direction of venous flow, normal respiratory phasicity and response to augmentation. Limited views of the contralateral common femoral vein are unremarkable. OTHER None. Limitations: Technologist describes technically difficult study secondary to swelling and pain in the region of the left femoral vein. IMPRESSION: No femoropopliteal DVT nor evidence of DVT within the visualized calf veins. If clinical symptoms are inconsistent or if there are persistent or worsening symptoms, further imaging (possibly involving the iliac veins) may be warranted. Electronically Signed   By: Corlis Leak M.D.   On: 10/06/2020 16:27   US Abdomen Limited RUQ (LIVER/GB)  Result Date: 10/07/2020 CLINICAL DATA:  Hyperbilirubinemia EXAM: ULTRASOUND ABDOMEN LIMITED RIGHT UPPER QUADRANT COMPARISON:  None. FINDINGS: Gallbladder: Multiple mobile gallstones measuring up to 1.7 cm. No pericholecystic fluid or wall thickening visualized. No sonographic Murphy sign noted by sonographer. Common bile duct: Diameter: 7 mm which is within normal limits for patient's age. Liver: No focal lesion identified. Within normal limits in parenchymal echogenicity. Portal vein is patent on color Doppler imaging with normal direction of  blood flow towards the liver. Other: None. IMPRESSION: Cholelithiasis without sonographic evidence of acute cholecystitis. Electronically Signed   By: Tinnie Gens  Caprice Beaver MD   On: 10/07/2020 14:39     Medications   Scheduled Meds: . acetaminophen  1,000 mg Oral Q8H  . aspirin EC  81 mg Oral Daily  . Chlorhexidine Gluconate Cloth  6 each Topical Daily  . docusate sodium  100 mg Oral BID  . enoxaparin (LOVENOX) injection  40 mg Subcutaneous Q24H  . feeding supplement  237 mL Oral BID BM  . furosemide  20 mg Intravenous BID  . multivitamin with minerals  1 tablet Oral Daily  . pantoprazole  40 mg Oral BID  . polyethylene glycol  17 g Oral Daily  . [START ON 10/09/2020] predniSONE  50 mg Oral Q breakfast   Followed by  . [START ON 10/10/2020] predniSONE  40 mg Oral Q breakfast   Followed by  . [START ON 10/11/2020] predniSONE  30 mg Oral Q breakfast   Followed by  . [START ON 10/12/2020] predniSONE  20 mg Oral Q breakfast   Followed by  . [START ON 10/13/2020] predniSONE  10 mg Oral Q breakfast  . rosuvastatin  10 mg Oral Daily  . senna-docusate  1 tablet Oral BID  . traZODone  50 mg Oral QHS   Continuous Infusions: . methocarbamol (ROBAXIN) IV         LOS: 6 days    Time spent: 25 minutes with greater than 30% spent in coordination of care and at bedside    Pennie Banter, DO [facility]  10/08/2020, 3:38 PM      If 7PM-7AM, please contact night-coverage. How to contact the [facility] Attending or Consulting provider 7A - 7P or covering provider during after hours 7P -7A, for this patient?    5. Check the care team in [facility] and look for a) attending/consulting TR[facility] provider listed and b) the [facility] team listed 6. Log into [email or web address] and use [place]'s universal password to access. If you do not have the password, please contact the hospital operator. 7. Locate the [facility] provider you are looking for under [facility] and page to a number that you can be directly  reached. 8. If you still have difficulty reaching the provider, please page the [facility] (Director on Call) for the Hospitalists listed on amion for assistance.

## 2020-10-08 NOTE — Progress Notes (Signed)
Subjective: 5 Days Post-Op Procedure(s) (LRB): INTRAMEDULLARY (IM) NAIL INTERTROCHANTRIC (Left)  Patient and family sleeping soundly this AM. Patient evaluated by Pulmonology yesterday, still requiring O2. Plan is to go to rehab after hospital stay. US performed on legs, negative for DVT. Patient has had a BM.   Objective: Vital signs in last 24 hours: Temp:  [97.5 F (36.4 C)-98.9 F (37.2 C)] 98.9 F (37.2 C) (02/27 0502) Pulse Rate:  [45-102] 102 (02/27 0502) Resp:  [17-19] 17 (02/27 0502) BP: (88-127)/(42-80) 103/49 (02/27 0502) SpO2:  [78 %-100 %] 95 % (02/27 0502)  Intake/Output from previous day:  Intake/Output Summary (Last 24 hours) at 10/08/2020 0742 Last data filed at 10/07/2020 2230 Gross per 24 hour  Intake 240 ml  Output 1700 ml  Net -1460 ml    Intake/Output this shift: No intake/output data recorded.  Labs: Recent Labs    10/06/20 0434 10/06/20 1249 10/07/20 0345 10/08/20 0555  HGB 7.8* 8.2* 8.4* 7.8*   Recent Labs    10/07/20 0345 10/08/20 0555  WBC 10.8* 9.4  RBC 3.00* 2.81*  HCT 26.8* 24.8*  PLT 196 223   Recent Labs    10/07/20 0345 10/08/20 0555  NA 136 132*  K 4.8 4.4  CL 99 95*  CO2 30 30  BUN 16 19  CREATININE 0.68 0.80  GLUCOSE 119* 116*  CALCIUM 8.4* 8.2*   No results for input(s): LABPT, INR in the last 72 hours.  EXAM General - Patient is sleeping soundly this morning.  Daughter asleep in the chair next to the bed. Extremity - Incision: dressing C/D/I Compartment soft  Dressing/Incision - clean, dry, no drainage.  Change this morning. Motor Function - intact, moving foot and toes well on exam.   Past Medical History:  Diagnosis Date  . Aortic stenosis    a. LHC 06/02/17: At least moderate aortic stenosis with a peak to peak gradient of 22 mmHg; b. TTE 05/2017: EF 55-60%, mild HK basal-midinferior wall, Gr1DD, mod to sev AS w/ mean gradient 21 mmHg, valve area 0.99, mild MR, mildly dilated LA   . CAD (coronary  artery disease) 2018   a. inferior STEMI 06/02/2017: LHC 06/02/17: LM 20, D1 20%, o-pLCx 50, p-mRCA 100% s/p PCI/DES, mRCA 50, dRCA 30  . GI bleed    a. noted 06/05/2017  . HLD (hyperlipidemia)   . Obesity     Assessment/Plan: 5 Days Post-Op Procedure(s) (LRB): INTRAMEDULLARY (IM) NAIL INTERTROCHANTRIC (Left) Principal Problem:   Closed comminuted intertrochanteric fracture of proximal end of left femur (HCC) Active Problems:   Aortic stenosis   Obesity   CAD (coronary artery disease)   Severe aortic stenosis   Acute respiratory failure with hypoxia (HCC)  Estimated body mass index is 39.94 kg/m as calculated from the following:   Height as of this encounter: 5\' 5"  (1.651 m).   Weight as of this encounter: 108.9 kg. Advance diet Up with therapy D/C IV fluids   Labs reviewed this AM. Hg 7.8 this AM this morning.  Continue to monitor. Continue to wean off of O2 as tolerated.  Pulmonology consulted for the patient. Plan for discharge to SNF when appropriate. Upon discharge, follow-up with [facility] in two weeks for staple removal and x-rays of the left femur.  DVT Prophylaxis - Lovenox, Foot Pumps and TED hose Toe-touch Weight-Bearing to left leg  [facility], PA-C Orthopaedic Surgery 10/08/2020, 7:42 AM

## 2020-10-08 NOTE — Progress Notes (Signed)
PHARMACIST - PHYSICIAN COMMUNICATION  CONCERNING: IV to Oral Route Change Policy  RECOMMENDATION: This patient is receiving pantoprazole by the intravenous route.  Based on criteria approved by the Pharmacy and Therapeutics Committee, the intravenous medication(s) is/are being converted to the equivalent oral dose form(s).   DESCRIPTION: These criteria include:  The patient is eating (either orally or via tube) and/or has been taking other orally administered medications for a least 24 hours  The patient has no evidence of active gastrointestinal bleeding or impaired GI absorption (gastrectomy, short bowel, patient on TNA or NPO).  If you have questions about this conversion, please contact the Pharmacy Department  [x]   [phone] )  [facility]  [facility] 10/08/2020 9:52 AM

## 2020-10-08 NOTE — Progress Notes (Signed)
Physical Therapy Treatment Patient Details Name: Monica [name] MRN: [id number] DOB: [birth year] Today's Date: 10/08/2020    History of Present Illness 87 y.o. female with a history of prior MI with stent placement in 2018, aortic stenosis, and GI bleed who had a fall.  Found to have L intertrochanteric hip fx with subsequent IM nailing ORIF 2/22.    PT Comments    Pt ready for session.  Stated she lost HEP sheets and asking for new ones.  Given and reviewed.  Participated in exercises as described below.  She is able to assist with repositioning in bed with HOB lowered and min a x 1 with education and encouragement.  OOB deferred given level of assist.  She is Product/process development scientist appropriate at this time for pt as staff safety in OOB needed.    Follow Up Recommendations  SNF     Equipment Recommendations       Recommendations for Other Services       Precautions / Restrictions Precautions Precautions: Fall Precaution Comments: incontinent Restrictions Weight Bearing Restrictions: Yes LLE Weight Bearing: Touchdown weight bearing    Mobility  Bed Mobility Overal bed mobility: Needs Assistance Bed Mobility: Rolling Rolling: Max assist              Transfers                 General transfer comment: deferred due to level of assist.  Michiel Sites appropriate at this time  Ambulation/Gait             General Gait Details: unable/unsafe at this time   Social research officer, government Rankin (Stroke Patients Only)       Balance                                            Cognition Arousal/Alertness: Awake/alert Behavior During Therapy: WFL for tasks assessed/performed;Anxious Overall Cognitive Status: Within Functional Limits for tasks assessed                                        Exercises General Exercises - Lower Extremity Ankle Circles/Pumps: AROM;10 reps;Both Quad Sets: AROM;10 reps;Both Gluteal  Sets: AROM;10 reps;Both Short Arc Quad: AAROM;AROM;10 reps Heel Slides: AROM;AAROM;Both;10 reps Hip ABduction/ADduction: AAROM;10 reps Straight Leg Raises: AAROM;10 reps;Both    General Comments        Pertinent Vitals/Pain Pain Assessment: Faces Faces Pain Scale: Hurts even more Pain Location: L hip Pain Descriptors / Indicators: Operative site guarding;Tender Pain Intervention(s): Limited activity within patient's tolerance;Monitored during session;Repositioned    Home Living                      Prior Function            PT Goals (current goals can now be found in the care plan section) Progress towards PT goals: Progressing toward goals    Frequency    7X/week      PT Plan      Co-evaluation              AM-PAC PT "6 Clicks" Mobility   Outcome Measure  Help needed turning from your back to your side while in a  flat bed without using bedrails?: A Lot Help needed moving from lying on your back to sitting on the side of a flat bed without using bedrails?: A Lot Help needed moving to and from a bed to a chair (including a wheelchair)?: Total   Help needed to walk in hospital room?: Total Help needed climbing 3-5 steps with a railing? : Total 6 Click Score: 7    End of Session Equipment Utilized During Treatment: Oxygen Activity Tolerance: Patient tolerated treatment well Patient left: in bed;with call bell/phone within reach;with bed alarm set;with family/visitor present Nurse Communication: Mobility status PT Visit Diagnosis: Muscle weakness (generalized) (M62.81);Difficulty in walking, not elsewhere classified (R26.2);Pain Pain - Right/Left: Left Pain - part of body: Hip     Time: 0938-1829 PT Time Calculation (min) (ACUTE ONLY): 13 min  Charges:  $Therapeutic Exercise: 8-22 mins                    Danielle Dess, PTA 10/08/20, 9:56 AM

## 2020-10-08 NOTE — Progress Notes (Signed)
Best idea is to have pulm discuss RHC with DR MA directly tomorrow, as he knows her best.

## 2020-10-09 DIAGNOSIS — S72142D Displaced intertrochanteric fracture of left femur, subsequent encounter for closed fracture with routine healing: Secondary | ICD-10-CM

## 2020-10-09 DIAGNOSIS — I350 Nonrheumatic aortic (valve) stenosis: Secondary | ICD-10-CM | POA: Diagnosis not present

## 2020-10-09 LAB — RESP PANEL BY RT-PCR (FLU A&B, COVID) ARPGX2
Influenza A by PCR: NEGATIVE
Influenza B by PCR: NEGATIVE
SARS Coronavirus 2 by RT PCR: NEGATIVE

## 2020-10-09 LAB — COMPREHENSIVE METABOLIC PANEL
ALT: 23 U/L (ref 0–44)
AST: 40 U/L (ref 15–41)
Albumin: 2.7 g/dL — ABNORMAL LOW (ref 3.5–5.0)
Alkaline Phosphatase: 61 U/L (ref 38–126)
Anion gap: 10 (ref 5–15)
BUN: 27 mg/dL — ABNORMAL HIGH (ref 8–23)
CO2: 29 mmol/L (ref 22–32)
Calcium: 8.4 mg/dL — ABNORMAL LOW (ref 8.9–10.3)
Chloride: 94 mmol/L — ABNORMAL LOW (ref 98–111)
Creatinine, Ser: 0.87 mg/dL (ref 0.44–1.00)
GFR, Estimated: >60 mL/min (ref >60)
Glucose, Bld: 146 mg/dL — ABNORMAL HIGH (ref 70–99)
Potassium: 4.4 mmol/L (ref 3.5–5.1)
Sodium: 133 mmol/L — ABNORMAL LOW (ref 135–145)
Total Bilirubin: 2.6 mg/dL — ABNORMAL HIGH (ref 0.3–1.2)
Total Protein: 6.5 g/dL (ref 6.5–8.1)

## 2020-10-09 LAB — CBC WITH DIFFERENTIAL/PLATELET
Abs Immature Granulocytes: 0.06 10*3/uL (ref 0.00–0.07)
Basophils Absolute: 0 10*3/uL (ref 0.0–0.1)
Basophils Relative: 0 %
Eosinophils Absolute: 0 10*3/uL (ref 0.0–0.5)
Eosinophils Relative: 0 %
HCT: 26.3 % — ABNORMAL LOW (ref 36.0–46.0)
Hemoglobin: 8.5 g/dL — ABNORMAL LOW (ref 12.0–15.0)
Immature Granulocytes: 1 %
Lymphocytes Relative: 14 %
Lymphs Abs: 1.6 10*3/uL (ref 0.7–4.0)
MCH: 27.9 pg (ref 26.0–34.0)
MCHC: 32.3 g/dL (ref 30.0–36.0)
MCV: 86.2 fL (ref 80.0–100.0)
Monocytes Absolute: 1.1 10*3/uL — ABNORMAL HIGH (ref 0.1–1.0)
Monocytes Relative: 10 %
Neutro Abs: 8.3 10*3/uL — ABNORMAL HIGH (ref 1.7–7.7)
Neutrophils Relative %: 75 %
Platelets: 303 10*3/uL (ref 150–400)
RBC: 3.05 MIL/uL — ABNORMAL LOW (ref 3.87–5.11)
RDW: 14.8 % (ref 11.5–15.5)
WBC: 11 10*3/uL — ABNORMAL HIGH (ref 4.0–10.5)
nRBC: 0.3 % — ABNORMAL HIGH (ref 0.0–0.2)

## 2020-10-09 LAB — PHOSPHORUS: Phosphorus: 3.5 mg/dL (ref 2.5–4.6)

## 2020-10-09 LAB — MAGNESIUM: Magnesium: 2.1 mg/dL (ref 1.7–2.4)

## 2020-10-09 MED ORDER — ACETAMINOPHEN 325 MG PO TABS: 650.0000 mg | ORAL_TABLET | Freq: Four times a day (QID) | ORAL | 2 refills | Status: DC | PRN

## 2020-10-09 MED ORDER — MIDODRINE HCL 5 MG PO TABS
2.5000 mg | ORAL_TABLET | Freq: Two times a day (BID) | ORAL | Status: DC
  Administered 2020-10-09: 2.5 mg via ORAL
  Filled 2020-10-09: qty 1

## 2020-10-09 MED ORDER — DOCUSATE SODIUM 100 MG PO CAPS: 100.0000 mg | ORAL_CAPSULE | Freq: Two times a day (BID) | ORAL | 0 refills | Status: DC

## 2020-10-09 MED ORDER — BUMETANIDE 0.25 MG/ML IJ SOLN: 1.0000 mg | Freq: Every day | INTRAMUSCULAR | Status: DC

## 2020-10-09 MED ORDER — BISACODYL 5 MG PO TBEC: 5.0000 mg | DELAYED_RELEASE_TABLET | Freq: Every day | ORAL | 0 refills | Status: DC | PRN

## 2020-10-09 MED ORDER — ADULT MULTIVITAMIN W/MINERALS CH: 1.0000 | ORAL_TABLET | Freq: Every day | ORAL | Status: DC

## 2020-10-09 MED ORDER — PREDNISONE 10 MG PO TABS: ORAL_TABLET | ORAL | 0 refills | Status: AC

## 2020-10-09 MED ORDER — MIDODRINE HCL 2.5 MG PO TABS: 2.5000 mg | ORAL_TABLET | Freq: Two times a day (BID) | ORAL | Status: DC

## 2020-10-09 MED ORDER — ONDANSETRON HCL 4 MG PO TABS: 4.0000 mg | ORAL_TABLET | Freq: Four times a day (QID) | ORAL | 0 refills | Status: DC | PRN

## 2020-10-09 MED ORDER — SENNOSIDES-DOCUSATE SODIUM 8.6-50 MG PO TABS: 1.0000 | ORAL_TABLET | Freq: Two times a day (BID) | ORAL | Status: DC

## 2020-10-09 MED ORDER — ENSURE ENLIVE PO LIQD: 237.0000 mL | Freq: Two times a day (BID) | ORAL | 12 refills | Status: DC

## 2020-10-09 MED ORDER — TORSEMIDE 10 MG PO TABS: 10.0000 mg | ORAL_TABLET | Freq: Every day | ORAL | Status: DC

## 2020-10-09 MED ORDER — METOCLOPRAMIDE HCL 5 MG PO TABS
5.0000 mg | ORAL_TABLET | Freq: Three times a day (TID) | ORAL | Status: DC | PRN
Start: 2020-10-09 — End: 2021-01-22

## 2020-10-09 MED ORDER — OXYCODONE HCL 5 MG PO TABS: 5.0000 mg | ORAL_TABLET | ORAL | 0 refills | Status: AC | PRN

## 2020-10-09 MED ORDER — METHOCARBAMOL 500 MG PO TABS: 500.0000 mg | ORAL_TABLET | Freq: Four times a day (QID) | ORAL | Status: DC | PRN

## 2020-10-09 MED ORDER — POLYETHYLENE GLYCOL 3350 17 G PO PACK: 17.0000 g | PACK | Freq: Every day | ORAL | 0 refills | Status: DC

## 2020-10-09 NOTE — Progress Notes (Signed)
Pulmonary Medicine          Date: 10/09/2020,   MRN# [id number] Monica [name] 1934/03/17     AdmissionWeight: 108.9 kg                 CurrentWeight: 108.9 kg   Referring physician: Dr Denton Lank   CHIEF COMPLAINT:   Post-operative respiratory failure   HISTORY OF PRESENT ILLNESS   87 y.o. female with medical history significant for aortic stenosis, coronary artery disease, dyslipidemia and obesity who presents to the ER via EMS for evaluation following a mechanical fall.  Patientfell into the dresser landing on her left side.  She was unable to get up or bear weight on the left leg and her daughter who heard her fall called 911.  She denied feeling dizzy or lightheaded prior to the fall and denied head trauma or loss of consciousness.  Per EMS her left lower extremity was shortened. Patient denies having any chest pain, no shortness of breath, no palpitations, no diaphoresis, no nausea, no vomiting, no abdominal pain, no diarrhea, no constipation, no fever, no chills, no cough, no sore throat, no urinary frequency, no nocturia, no dysuria, no headache, no blurred vision, weakness or focal deficits.Left femur x-ray shows severely comminuted and displaced intertrochanteric fracture of proximal left femur  Orthopedic surgery was consulted in the ER and surgical repair is planned for 10/02/20. Patient was noted to have inreased oxygen requirement post operatively.CTPE was done and included in pictorial documentation below with findings of diffuse congestion with interstitial edema and prominent vascular pedicle and generous pulmonary arteries with BZ:JIRCVE index >1 suggestive of PH as well as mild flattening and enlargement of RV size suggestive of RV strain.  There is absence of consolidated infiltrate to suggest pneumonia, there is only mild bibasilar atelectasis, no masses or significant nodules are present. The adenopathy noted here is significant and may be reactive to congestion  or infective/inflammatory process. Patient does appear to have chronic moderate normocytic anemia. She is with hypoalbuminemia which in some instances may be due to malnuritment when not in context of liver or renal failure and leads to additional third spacing of fluid. She has had transient episodes of hypoxemia overnight despite supplemental O2.  She has had episodes of hypotension overnight but mostly vitals have been in reference range.    09/2720- patient is examined at bedside and is improved.[name] is at bedside and we discussed careplan.  She is weaned to 3L/min [place] from 4 and is 100% but can wean further. She did well with PT today but had leg pain after which makes sense since she just had surgery, overall seems to be recovering well. She has COPD so its taking her a bit longer. Will start low dose prednisone today.   10/09/20-  Patient is improved clinically, she reports breathing is better. Discussed care plan with Dr Denton Lank and Dr Kirke Corin today.  Daughter at bedside we reviwed hospital findings and plan. Will work to optimize for TAVR and RHC.Marland Kitchen  Plan to dc on prn diuretic and medrol dose pack.   PAST MEDICAL HISTORY   Past Medical History:  Diagnosis Date  . Aortic stenosis    a. LHC 06/02/17: At least moderate aortic stenosis with a peak to peak gradient of 22 mmHg; b. TTE 05/2017: EF 55-60%, mild HK basal-midinferior wall, Gr1DD, mod to sev AS w/ mean gradient 21 mmHg, valve area 0.99, mild MR, mildly dilated LA   . CAD (coronary artery disease)  2018   a. inferior STEMI 06/02/2017: LHC 06/02/17: LM 20, D1 20%, o-pLCx 50, p-mRCA 100% s/p PCI/DES, mRCA 50, dRCA 30  . GI bleed    a. noted 06/05/2017  . HLD (hyperlipidemia)   . Obesity      SURGICAL HISTORY   Past Surgical History:  Procedure Laterality Date  . ANKLE RECONSTRUCTION  1956   also ORIF of right arm  . CORONARY STENT INTERVENTION N/A 06/01/2017   Procedure: Coronary/Graft Acute MI Revascularization;   Surgeon: Iran Ouch, MD;  Location: [facility] INVASIVE CV LAB;  Service: Cardiovascular;  Laterality: N/A;  . INTRAMEDULLARY (IM) NAIL INTERTROCHANTERIC Left 10/03/2020   Procedure: INTRAMEDULLARY (IM) NAIL INTERTROCHANTRIC;  Surgeon: Signa Kell, MD;  Location: [facility] ORS;  Service: Orthopedics;  Laterality: Left;  . LEFT HEART CATH AND CORONARY ANGIOGRAPHY N/A 06/01/2017   Procedure: LEFT HEART CATH AND CORONARY ANGIOGRAPHY;  Surgeon: Iran Ouch, MD;  Location: [facility] INVASIVE CV LAB;  Service: Cardiovascular;  Laterality: N/A;     FAMILY HISTORY   Family History  Problem Relation Age of Onset  . Valvular heart disease Mother   . Diabetes Mellitus II Daughter      SOCIAL HISTORY   Social History   Tobacco Use  . Smoking status: Former Smoker    Packs/day: 0.50    Years: 50.00    Pack years: 25.00    Types: Cigarettes    Quit date: 06/01/2017    Years since quitting: 3.3  . Smokeless tobacco: Never Used  Vaping Use  . Vaping Use: Never used  Substance Use Topics  . Alcohol use: No  . Drug use: No     MEDICATIONS    Home Medication:  Current Outpatient Rx  . Order #: [id number] Class: Print  . Order #: [id number] Class: Print  . Order #: [id number] Class: Print    Current Medication:  Current Facility-Administered Medications:  .  acetaminophen (TYLENOL) tablet 1,000 mg, 1,000 mg, Oral, Q8H, Signa Kell, MD, 1,000 mg at 10/07/20 1449 .  aspirin EC tablet 81 mg, 81 mg, Oral, Daily, Signa Kell, MD, 81 mg at 10/09/20 0825 .  bisacodyl (DULCOLAX) EC tablet 5 mg, 5 mg, Oral, Daily PRN, Esaw Grandchild A, DO .  bisacodyl (DULCOLAX) suppository 10 mg, 10 mg, Rectal, Daily PRN, Signa Kell, MD .  Melene Muller ON 10/10/2020] bumetanide (BUMEX) injection 1 mg, 1 mg, Intravenous, Daily, Arida, Chelsea Aus, MD .  Chlorhexidine Gluconate Cloth 2 % PADS 6 each, 6 each, Topical, Daily, Signa Kell, MD, 6 each at 10/07/20 [phone] .  docusate sodium (COLACE) capsule 100 mg, 100 mg,  Oral, BID, Signa Kell, MD, 100 mg at 10/09/20 [phone] .  enoxaparin (LOVENOX) injection 40 mg, 40 mg, Subcutaneous, Q24H, Signa Kell, MD, 40 mg at 10/09/20 1497 .  feeding supplement (ENSURE ENLIVE / ENSURE PLUS) liquid 237 mL, 237 mL, Oral, BID BM, Drema Dallas, MD, 237 mL at 10/09/20 [phone] .  methocarbamol (ROBAXIN) tablet 500 mg, 500 mg, Oral, Q6H PRN, 500 mg at 10/05/20 0910 **OR** methocarbamol (ROBAXIN) 500 mg in dextrose 5 % 50 mL IVPB, 500 mg, Intravenous, Q6H PRN, Signa Kell, MD .  metoCLOPramide (REGLAN) tablet 5-10 mg, 5-10 mg, Oral, Q8H PRN **OR** metoCLOPramide (REGLAN) injection 5-10 mg, 5-10 mg, Intravenous, Q8H PRN, Signa Kell, MD .  multivitamin with minerals tablet 1 tablet, 1 tablet, Oral, Daily, Drema Dallas, MD, 1 tablet at 10/07/20 [phone] .  ondansetron (ZOFRAN) tablet 4 mg, 4 mg, Oral, Q6H PRN **OR** ondansetron (ZOFRAN)  injection 4 mg, 4 mg, Intravenous, Q6H PRN, Signa KellPatel, Sunny, MD .  oxyCODONE (Oxy IR/ROXICODONE) immediate release tablet 2.5-5 mg, 2.5-5 mg, Oral, Q3H PRN, Signa KellPatel, Sunny, MD, 5 mg at 10/04/20 0853 .  oxyCODONE (Oxy IR/ROXICODONE) immediate release tablet 5-10 mg, 5-10 mg, Oral, Q4H PRN, Signa KellPatel, Sunny, MD, 10 mg at 10/08/20 2152 .  pantoprazole (PROTONIX) EC tablet 40 mg, 40 mg, Oral, BID, Tressie EllisChappell, Alex B, RPH, 40 mg at 10/09/20 0827 .  polyethylene glycol (MIRALAX / GLYCOLAX) packet 17 g, 17 g, Oral, Daily, Esaw GrandchildGriffith, Kelly A, DO, 17 g at 10/09/20 0825 .  [COMPLETED] predniSONE (DELTASONE) tablet 60 mg, 60 mg, Oral, Once, 60 mg at 10/08/20 1524 **FOLLOWED BY** [COMPLETED] predniSONE (DELTASONE) tablet 50 mg, 50 mg, Oral, Q breakfast, 50 mg at 10/09/20 0827 **FOLLOWED BY** [START ON 10/10/2020] predniSONE (DELTASONE) tablet 40 mg, 40 mg, Oral, Q breakfast **FOLLOWED BY** [START ON 10/11/2020] predniSONE (DELTASONE) tablet 30 mg, 30 mg, Oral, Q breakfast **FOLLOWED BY** [START ON 10/12/2020] predniSONE (DELTASONE) tablet 20 mg, 20 mg, Oral, Q breakfast **FOLLOWED BY**  [START ON 10/13/2020] predniSONE (DELTASONE) tablet 10 mg, 10 mg, Oral, Q breakfast, Mehran Guderian, MD .  rosuvastatin (CRESTOR) tablet 10 mg, 10 mg, Oral, Daily, Signa KellPatel, Sunny, MD, 10 mg at 10/09/20 [phone] .  senna-docusate (Senokot-S) tablet 1 tablet, 1 tablet, Oral, BID, Pennie BanterGriffith, Kelly A, DO, 1 tablet at 10/09/20 [phone] .  sodium phosphate (FLEET) 7-19 GM/118ML enema 1 enema, 1 enema, Rectal, Once PRN, Signa KellPatel, Sunny, MD .  traMADol Janean Sark(ULTRAM) tablet 50 mg, 50 mg, Oral, Q6H PRN, Signa KellPatel, Sunny, MD, 50 mg at 10/09/20 0850 .  traZODone (DESYREL) tablet 50 mg, 50 mg, Oral, QHS, Esaw GrandchildGriffith, Kelly A, DO, 50 mg at 10/08/20 2152    ALLERGIES   Patient has no known allergies.     REVIEW OF SYSTEMS    Review of Systems:  Gen:  Denies  fever, sweats, chills weigh loss  HEENT: Denies blurred vision, double vision, ear pain, eye pain, hearing loss, nose bleeds, sore throat Cardiac:  No dizziness, chest pain or heaviness, chest tightness,edema Resp:   Denies cough or sputum porduction, shortness of breath,wheezing, hemoptysis,  Gi: Denies swallowing difficulty, stomach pain, nausea or vomiting, diarrhea, constipation, bowel incontinence Gu:  Denies bladder incontinence, burning urine Ext:   Denies Joint pain, stiffness or swelling Skin: Denies  skin rash, easy bruising or bleeding or hives Endoc:  Denies polyuria, polydipsia , polyphagia or weight change Psych:   Denies depression, insomnia or hallucinations   Other:  All other systems negative   VS: BP 107/68 (BP Location: Right Arm)   Pulse 93   Temp 98 F (36.7 C) (Oral)   Resp 14   Ht 5\' 5"  (1.651 m)   Wt 108.9 kg   SpO2 91%   BMI 39.94 kg/m      PHYSICAL EXAM    GENERAL:NAD, no fevers, chills, no weakness no fatigue HEAD: Normocephalic, atraumatic.  EYES: Pupils equal, round, reactive to light. Extraocular muscles intact. No scleral icterus.  MOUTH: Moist mucosal membrane. Dentition intact. No abscess noted.  EAR, NOSE, THROAT:  Clear without exudates. No external lesions.  NECK: Supple. No thyromegaly. No nodules. No JVD.  PULMONARY: Diffuse coarse rhonchi right sided +wheezes CARDIOVASCULAR: S1 and S2. Regular rate and rhythm. No murmurs, rubs, or gallops. No edema. Pedal pulses 2+ bilaterally.  GASTROINTESTINAL: Soft, nontender, nondistended. No masses. Positive bowel sounds. No hepatosplenomegaly.  MUSCULOSKELETAL: No swelling, clubbing, or edema. Range of motion full in all  extremities.  NEUROLOGIC: Cranial nerves II through XII are intact. No gross focal neurological deficits. Sensation intact. Reflexes intact.  SKIN: No ulceration, lesions, rashes, or cyanosis. Skin warm and dry. Turgor intact.  PSYCHIATRIC: Mood, affect within normal limits. The patient is awake, alert and oriented x 3. Insight, judgment intact.       IMAGING    CT ANGIO CHEST PE W OR WO CONTRAST  Result Date: 10/05/2020 CLINICAL DATA:  Elevated D-dimer, recent surgery EXAM: CT ANGIOGRAPHY CHEST WITH CONTRAST TECHNIQUE: Multidetector CT imaging of the chest was performed using the standard protocol during bolus administration of intravenous contrast. Multiplanar CT image reconstructions and MIPs were obtained to evaluate the vascular anatomy. CONTRAST:  75mL OMNIPAQUE IOHEXOL 350 MG/ML SOLN COMPARISON:  None. FINDINGS: Cardiovascular: Satisfactory opacification of the pulmonary arteries to the proximal segmental level. However, there is respiratory motion. Question of nonocclusive right basal subsegmental and left lower lobe segmental and subsegmental filling defects. Enlargement of the main pulmonary artery measuring 3.7 cm suggesting pulmonary arterial hypertension. Cardiomegaly. Coronary artery calcification. No pericardial effusion. Thoracic aorta is normal in caliber with mixed plaque. Mediastinum/Nodes: Mediastinal adenopathy is present. A prevascular node on image 26 of series 4 measures 1.7 cm in short axis. Subcarinal node on image 42 also  measures 2 cm in short axis. Nonenlarged hilar nodes. Small hiatal hernia. Thyroid is unremarkable. Lungs/Pleura: Imaging in expiration. Interstitial thickening and patchy ground-glass density. Bibasilar atelectasis. Upper Abdomen: No acute abnormality.  Cholelithiasis. Musculoskeletal: Degenerative changes of the spine. No acute osseous abnormality. Review of the MIP images confirms the above findings. IMPRESSION: No definite pulmonary embolism. Question of nonocclusive right basal subsegmental and left lower lobe segmental and subsegmental filling defects. There is significant respiratory motion and these could be artifactual. Bibasilar atelectasis. Interstitial thickening and patchy ground-glass density, which could reflect additional atelectasis (imaging in expiration), mild edema, or less likely atypical pneumonia. Mediastinal adenopathy. Cardiomegaly. Enlargement of the main pulmonary artery suggesting pulmonary arterial hypertension. Electronically Signed   By: Guadlupe Spanish M.D.   On: 10/05/2020 15:06   DG Hand 2 View Left  Result Date: 10/02/2020 CLINICAL DATA:  Left hand pain after fall. EXAM: LEFT HAND - 2 VIEW COMPARISON:  None. FINDINGS: There is no evidence of fracture or dislocation. Moderate degenerative changes seen involving the first carpometacarpal joint. Soft tissues are unremarkable. 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 No acute abnormality seen in the left hand. Electronically Signed   By: Lupita Raider M.D.   On: 10/02/2020 09:52   US Venous Img Lower Bilateral (DVT)  Result Date: 10/06/2020 CLINICAL DATA:  Hypoxia, acute respiratory failure EXAM: BILATERAL LOWER EXTREMITY VENOUS DOPPLER ULTRASOUND TECHNIQUE: Gray-scale sonography with compression, as well as color and duplex ultrasound, were performed to evaluate the deep venous system(s) from the level of the common femoral vein through the popliteal and proximal calf veins. COMPARISON:   04/29/2018 FINDINGS: VENOUS Normal compressibility of the common femoral, superficial femoral, and popliteal veins, as well as the visualized calf veins. Visualized portions of profunda femoral vein and great saphenous vein unremarkable. No filling defects to suggest DVT on grayscale or color Doppler imaging. Doppler waveforms show normal direction of venous flow, normal respiratory phasicity and response to augmentation. Limited views of the contralateral common femoral vein are unremarkable. OTHER None. Limitations: Technologist describes technically difficult study secondary to swelling and pain in the region of the left femoral vein. IMPRESSION: No femoropopliteal DVT nor evidence of DVT within the visualized calf veins.  If clinical symptoms are inconsistent or if there are persistent or worsening symptoms, further imaging (possibly involving the iliac veins) may be warranted. Electronically Signed   By: Corlis Leak M.D.   On: 10/06/2020 16:27   DG Chest Port 1 View  Result Date: 10/05/2020 CLINICAL DATA:  Acute respiratory failure EXAM: PORTABLE CHEST 1 VIEW COMPARISON:  10/02/2020 FINDINGS: Cardiac enlargement without heart failure. Prominent lung markings diffusely unchanged. No focal consolidation or effusion. Chronic right rib fractures. IMPRESSION: No interval change. Prominent interstitial lung markings bilaterally and diffusely could represent chronic lung disease however infection or interstitial edema possible. Electronically Signed   By: Marlan Palau M.D.   On: 10/05/2020 10:37   DG Chest Portable 1 View  Result Date: 10/02/2020 CLINICAL DATA:  Left hip fracture. EXAM: PORTABLE CHEST 1 VIEW COMPARISON:  June 05, 2017. FINDINGS: Mild cardiomegaly is noted. No pneumothorax pleural effusion is noted. Increased diffuse interstitial densities are noted throughout both lungs which may represent diffuse scarring, but acute superimposed atypical inflammation or edema cannot be excluded. Old right  rib fractures are noted. IMPRESSION: Increased diffuse interstitial densities are noted throughout both lungs which may represent diffuse scarring, but acute superimposed atypical inflammation or edema cannot be excluded. Electronically Signed   By: Lupita Raider M.D.   On: 10/02/2020 09:50   ECHOCARDIOGRAM COMPLETE  Result Date: 10/03/2020    ECHOCARDIOGRAM REPORT   Patient Name:   ALYLA PIETILA Date of Exam: 10/03/2020 Medical Rec #:  [id number]      Height:       65.0 in Accession #:    [id number]     Weight:       240.0 lb Date of Birth:  09/19/1933      BSA:          2.138 m Patient Age:    86 years       BP:           95/71 mmHg Patient Gender: F              HR:           86 bpm. Exam Location:  [facility] Procedure: 2D Echo, Color Doppler, Cardiac Doppler and Intracardiac            Opacification Agent Indications:     I35.0 Aortic stenosis  History:         Patient has prior history of Echocardiogram examinations, most                  recent 10/26/2019. CAD, Signs/Symptoms:Broken hip; Risk                  Factors:Dyslipidemia.  Sonographer:     Humphrey Rolls RDCS (AE) Referring Phys:  [id number] Sondra Barges Diagnosing Phys: Yvonne Kendall MD  Sonographer Comments: Suboptimal apical window. Image acquisition challenging due to patient body habitus. IMPRESSIONS  1. Left ventricular ejection fraction, by estimation, is 60 to 65%. The left ventricle has normal function. Left ventricular endocardial border not optimally defined to evaluate regional wall motion. There is mild left ventricular hypertrophy. Left ventricular diastolic parameters are consistent with Grade I diastolic dysfunction (impaired relaxation). Elevated left atrial pressure.  2. Right ventricular systolic function is normal. The right ventricular size is mildly enlarged. Tricuspid regurgitation signal is inadequate for assessing PA pressure.  3. Left atrial size was mildly dilated.  4. Right atrial size was mildly dilated.  5. The mitral valve is  degenerative. Trivial mitral  valve regurgitation. Mild mitral stenosis. Moderate mitral annular calcification.  6. Tricuspid valve regurgitation at least moderate but suboptimally visualized.  7. The aortic valve is tricuspid. There is moderate calcification of the aortic valve. There is severe thickening of the aortic valve. Aortic valve regurgitation is not visualized. Severe aortic valve stenosis. Aortic valve area, by VTI measures 0.51 cm. Aortic valve mean gradient measures 60.0 mmHg. Aortic valve Vmax measures 4.83 m/s.  8. The inferior vena cava is normal in size with greater than 50% respiratory variability, suggesting right atrial pressure of 3 mmHg. Comparison(s): Very severe aortic stenosis, worsened compared with prior echocardiogram from 10/26/2019. FINDINGS  Left Ventricle: Left ventricular ejection fraction, by estimation, is 60 to 65%. The left ventricle has normal function. Left ventricular endocardial border not optimally defined to evaluate regional wall motion. Definity contrast agent was given IV to delineate the left ventricular endocardial borders. The left ventricular internal cavity size was normal in size. There is mild left ventricular hypertrophy. Left ventricular diastolic parameters are consistent with Grade I diastolic dysfunction (impaired relaxation). Elevated left atrial pressure. Right Ventricle: The right ventricular size is mildly enlarged. No increase in right ventricular wall thickness. Right ventricular systolic function is normal. Tricuspid regurgitation signal is inadequate for assessing PA pressure. Left Atrium: Left atrial size was mildly dilated. Right Atrium: Right atrial size was mildly dilated. Pericardium: There is no evidence of pericardial effusion. Mitral Valve: The mitral valve is degenerative in appearance. There is mild thickening of the mitral valve leaflet(s). Moderate mitral annular calcification. Trivial mitral valve regurgitation. Mild mitral valve  stenosis. MV peak gradient, 11.2 mmHg. The  mean mitral valve gradient is 5.0 mmHg. Tricuspid Valve: The tricuspid valve is not well visualized. Tricuspid valve regurgitation at least moderate but suboptimally visualized. Aortic Valve: The aortic valve is tricuspid. There is moderate calcification of the aortic valve. There is severe thickening of the aortic valve. Aortic valve regurgitation is not visualized. Severe aortic stenosis is present. Aortic valve mean gradient measures 60.0 mmHg. Aortic valve peak gradient measures 93.3 mmHg. Aortic valve area, by VTI measures 0.51 cm. Pulmonic Valve: The pulmonic valve was not well visualized. Pulmonic valve regurgitation is not visualized. No evidence of pulmonic stenosis. Aorta: The aortic root is normal in size and structure. Pulmonary Artery: The pulmonary artery is not well seen. Venous: The inferior vena cava is normal in size with greater than 50% respiratory variability, suggesting right atrial pressure of 3 mmHg. IAS/Shunts: The interatrial septum was not well visualized.  LEFT VENTRICLE PLAX 2D LVIDd:         4.00 cm  Diastology LVIDs:         2.50 cm  LV e' medial:    8.38 cm/s LV PW:         1.36 cm  LV E/e' medial:  13.8 LV IVS:        1.09 cm  LV e' lateral:   5.44 cm/s LVOT diam:     1.90 cm  LV E/e' lateral: 21.3 LV SV:         51 LV SV Index:   24 LVOT Area:     2.84 cm  RIGHT VENTRICLE RV Basal diam:  4.60 cm LEFT ATRIUM           Index       RIGHT ATRIUM           Index LA diam:      3.90 cm 1.82 cm/m  RA Area:  23.00 cm LA Vol (A4C): 69.3 ml 32.42 ml/m RA Volume:   76.20 ml  35.65 ml/m  AORTIC VALVE                    PULMONIC VALVE AV Area (Vmax):    0.53 cm     PV Vmax:       1.23 m/s AV Area (Vmean):   0.53 cm     PV Vmean:      80.200 cm/s AV Area (VTI):     0.51 cm     PV VTI:        0.229 m AV Vmax:           483.00 cm/s  PV Peak grad:  6.1 mmHg AV Vmean:          346.000 cm/s PV Mean grad:  3.0 mmHg AV VTI:            1.007 m AV  Peak Grad:      93.3 mmHg AV Mean Grad:      60.0 mmHg LVOT Vmax:         89.50 cm/s LVOT Vmean:        64.100 cm/s LVOT VTI:          0.181 m LVOT/AV VTI ratio: 0.18  AORTA Ao Root diam: 3.40 cm MITRAL VALVE MV Area (PHT): 7.29 cm     SHUNTS MV Area VTI:   1.93 cm     Systemic VTI:  0.18 m MV Peak grad:  11.2 mmHg    Systemic Diam: 1.90 cm MV Mean grad:  5.0 mmHg MV Vmax:       1.67 m/s MV Vmean:      102.0 cm/s MV Decel Time: 104 msec MV E velocity: 116.00 cm/s MV A velocity: 164.00 cm/s MV E/A ratio:  0.71 Cristal Deer End MD Electronically signed by Yvonne Kendall MD Signature Date/Time: 10/03/2020/9:54:38 AM    Final    DG HIP OPERATIVE UNILAT W OR W/O PELVIS LEFT  Result Date: 10/03/2020 CLINICAL DATA:  Intramedullary nail on the LEFT. EXAM: OPERATIVE LEFT HIP (WITH PELVIS IF PERFORMED)  VIEWS TECHNIQUE: Fluoroscopic spot image(s) were submitted for interpretation post-operatively. COMPARISON:  October 02, 2020 FINDINGS: Six intraoperative fluoroscopic views with changes of intramedullary rod placement and screw fixation across the femoral neck spanning a comminuted intertrochanteric fracture of the LEFT proximal femur. Midportion of the device is not imaged. Distal interlocking screws of been placed in the distal femur within the metadiaphyseal region. Proximally there is a compression type screw in a cannulated lag screw in place across the femoral neck. Persistent distraction of the lesser trochanteric fragment. IMPRESSION: Post ORIF of LEFT intertrochanteric fracture with intramedullary nailing and fixation devices as described. Midportion of the devices are not imaged.  Attention on follow-up. Electronically Signed   By: Donzetta Kohut M.D.   On: 10/03/2020 22:21   DG Hip Unilat W or Wo Pelvis 2-3 Views Left  Result Date: 10/02/2020 CLINICAL DATA:  Left hip pain after fall. EXAM: DG HIP (WITH OR WITHOUT PELVIS) 2-3V LEFT COMPARISON:  None. FINDINGS: Severely comminuted and displaced fracture is  seen involving the intertrochanteric region of the proximal left femur. IMPRESSION: Severely comminuted and displaced intertrochanteric fracture of proximal left femur. Electronically Signed   By: Lupita Raider M.D.   On: 10/02/2020 09:49   US Abdomen Limited RUQ (LIVER/GB)  Result Date: 10/07/2020 CLINICAL DATA:  Hyperbilirubinemia EXAM: ULTRASOUND ABDOMEN LIMITED RIGHT UPPER QUADRANT COMPARISON:  None. FINDINGS: Gallbladder: Multiple mobile gallstones measuring up to 1.7 cm. No pericholecystic fluid or wall thickening visualized. No sonographic Murphy sign noted by sonographer. Common bile duct: Diameter: 7 mm which is within normal limits for patient's age. Liver: No focal lesion identified. Within normal limits in parenchymal echogenicity. Portal vein is patent on color Doppler imaging with normal direction of blood flow towards the liver. Other: None. IMPRESSION: Cholelithiasis without sonographic evidence of acute cholecystitis. Electronically Signed   By: Maudry Mayhew MD   On: 10/07/2020 14:39      ASSESSMENT/PLAN   Acute hypoxemic respiratory failure  - patient has cardiac history including severe AS, CAD s/p MI and PCI, however new worsening TTE with new findings of RV dilation and LA abnormalities as well as findings of pulmonary hypertension, this may be due to PE vs development of pulmonary hypertension. -Reviewed with cardiologist - plan for Left and right heart cath when in chronic stable state.   -ABG with hypoxemia  -possible BM embolization syndrome vs AECOPD due to physical stress with fracture.  Patient also grieving due to loss of husband in December.  -Anemia is contributing to dyspnea and she had GI bleed last year, currently on DVT ppx with lovenox, will order Fecal occult stool -She does do PT with OOB to chair but is deconditioned, will order Incentive spirometer and will hold off on Vest therapy due to fracture and will consider chest PT with Metaneb if does not  improve   Probable Pulmonary hypertension  - will hold off on pulmonary vasolidatory at this time  - cardiology consult - PLAN FOR LEFT AND RIGHT HEART CATH ON OUTPATIENT  -diuresis - lasix 20 - DCD ALBUMIN TODAY -ABG reviewed -possible further workup for PE or BM embolism syndrome post long bone fracture - will think about V/Q vs vascular surgery eval ?. , will obtain ddimer and ABG in the interim  - PT/OT/CPT    Mild protein calorie malnutrition - decreased peripheral muscle mass with  low albumin  -contributing to third spacing of fluid and dyspnea - RD dietary consultation and continue PT/OT/CPT    Cholelithiasis  US liver no acute cholecystitis - patient denies abd pain.   Thank you for allowing me to participate in the care of this patient.   Patient/Family are satisfied with care plan and all questions have been answered.  This document was prepared using Dragon voice recognition software and may include unintentional dictation errors.     Vida Rigger, M.D.  Division of Pulmonary & Critical Care Medicine  [facility]

## 2020-10-09 NOTE — Care Management Important Message (Signed)
Important Message  Patient Details  Name: Monica [name] MRN: [id number] Date of Birth: Dec 03, 1933   Medicare Important Message Given:  Yes     [name] 10/09/2020, 1:35 PM

## 2020-10-09 NOTE — Progress Notes (Signed)
Patient is stable and ready for discharge [facility]. Patient's IVs removed. Patient trial by RT to wean from O2 [place] unable when O2 removed O2 dropped to 85%. Writer called report and spoke with [place] receiving nurse and she verbalized understanding of report provided and had no questions. Patient's family at bedside. Patient's family packed patient's belongings. Patient is being transported via [facility]. Patient's son at bedside time of discharge.

## 2020-10-09 NOTE — Progress Notes (Signed)
Subjective: 6 Days Post-Op Procedure(s) (LRB): INTRAMEDULLARY (IM) NAIL INTERTROCHANTRIC (Left)  Patient and family awake and patient is feeling better Patient evaluated by Pulmonology, still requiring O2. Plan is to go to rehab after hospital stay. Patient has had a BM.   Objective: Vital signs in last 24 hours: Temp:  [97.8 F (36.6 C)-99.4 F (37.4 C)] 98.4 F (36.9 C) (02/28 0406) Pulse Rate:  [50-101] 50 (02/28 0406) Resp:  [17-19] 18 (02/28 0406) BP: (101-118)/(45-76) 118/65 (02/28 0406) SpO2:  [92 %-100 %] 95 % (02/28 0406)  Intake/Output from previous day:  Intake/Output Summary (Last 24 hours) at 10/09/2020 0625 Last data filed at 10/08/2020 2011 Gross per 24 hour  Intake -  Output 2700 ml  Net -2700 ml    Intake/Output this shift: Total I/O In: -  Out: 450 [Urine:450]  Labs: Recent Labs    10/06/20 1249 10/07/20 0345 10/08/20 0555 10/09/20 0522  HGB 8.2* 8.4* 7.8* 8.5*   Recent Labs    10/08/20 0555 10/09/20 0522  WBC 9.4 11.0*  RBC 2.81* 3.05*  HCT 24.8* 26.3*  PLT 223 303   Recent Labs    10/08/20 0555 10/09/20 0522  NA 132* 133*  K 4.4 4.4  CL 95* 94*  CO2 30 29  BUN 19 27*  CREATININE 0.80 0.87  GLUCOSE 116* 146*  CALCIUM 8.2* 8.4*   Recent Labs    10/08/20 1000  INR 1.1    EXAM General - Patient is sleeping soundly this morning.  Daughter asleep in the chair next to the bed. Extremity - Incision: dressing C/D/I Compartment soft  Dressing/Incision - clean, dry, no drainage.  Change this morning. Motor Function - intact, moving foot and toes well on exam.   Past Medical History:  Diagnosis Date  . Aortic stenosis    a. LHC 06/02/17: At least moderate aortic stenosis with a peak to peak gradient of 22 mmHg; b. TTE 05/2017: EF 55-60%, mild HK basal-midinferior wall, Gr1DD, mod to sev AS w/ mean gradient 21 mmHg, valve area 0.99, mild MR, mildly dilated LA   . CAD (coronary artery disease) 2018   a. inferior STEMI 06/02/2017:  LHC 06/02/17: LM 20, D1 20%, o-pLCx 50, p-mRCA 100% s/p PCI/DES, mRCA 50, dRCA 30  . GI bleed    a. noted 06/05/2017  . HLD (hyperlipidemia)   . Obesity     Assessment/Plan: 6 Days Post-Op Procedure(s) (LRB): INTRAMEDULLARY (IM) NAIL INTERTROCHANTRIC (Left) Principal Problem:   Closed comminuted intertrochanteric fracture of proximal end of left femur (HCC) Active Problems:   Aortic stenosis   Obesity   CAD (coronary artery disease)   Severe aortic stenosis   Acute respiratory failure with hypoxia (HCC)  Estimated body mass index is 39.94 kg/m as calculated from the following:   Height as of this encounter: 5\' 5"  (1.651 m).   Weight as of this encounter: 108.9 kg. Advance diet Up with therapy D/C IV fluids   Labs reviewed this AM. Hg 8.5 this AM this morning.  Continue to monitor. Continue to wean off of O2 as tolerated.  Pulmonology consulted for the patient. Plan for discharge to SNF when appropriate. Upon discharge, follow-up with [facility] in two weeks for staple removal and x-rays of the left femur.  DVT Prophylaxis - Lovenox, Foot Pumps and TED hose Toe-touch Weight-Bearing to left leg  [facility], PA-C Orthopaedic Surgery 10/09/2020, 6:25 AM

## 2020-10-09 NOTE — Discharge Summary (Signed)
Physician Discharge Summary  Monica [name] ZDG:[id number] DOB: 1933-10-01 DOA: 10/02/2020  PCP: Patient, No Pcp Per  Admit date: 10/02/2020 Discharge date: 10/09/2020  Admitted From: home Disposition:  SNF  Recommendations for Outpatient Follow-up:  1. Follow up with PCP in 1-2 weeks 2. Please obtain BMP/CBC in one week 3. Please follow up with orthopedics 4. Follow up with cardiology for evaluation for valve replacement  5. Follow up with pulmonology regarding need for oxygen and respiratory issues  Home Health: No  Equipment/Devices: None   Discharge Condition: Stable  CODE STATUS: Full  Diet recommendation:  Heart healthy   Discharge Diagnoses: Principal Problem:   Closed comminuted intertrochanteric fracture of proximal end of left femur (HCC) Active Problems:   Acute respiratory failure with hypoxia (HCC)   Aortic stenosis   Obesity   CAD (coronary artery disease)   Severe aortic stenosis    Summary of HPI and Hospital Course:  87 y.o. WF PMHx aortic stenosis, CAD, dyslipidemia, obesity who presented to the ED on 10/03/20 via EMS after a mechanical fall at home resulting in a LEFT proximal femur fracture.  Patient reported the fall occurred when she lost her balance walking around her bed to turn up the thermostat, falling into a dresser and landing on her left side.    Admitted to the hospitalist service with orthopedics consulted.  Patient underwent surgery on afternoon of 2/22, with intramedullary nailing of the LEFT femur.  Patient evaluated by PT and OT postoperatively, and SNF for short-term rehab was recommended after discharge.  TOC is following and placement is underway.  Admission was prolonged due to persistent hypoxia.  Patient does not typically require supplemental oxygen, has need ~4 l/min since surgery.  Likely multifactorial due to AS, pulmonary hypertension, underlying COPD/emphysema on top of physical stress of hip fracture and surgery.  Patient was  evaluated by cardiology and pulmonology.  She will require oxygen on discharge, currently at 3 L/min.    Outpatient evaluation with Dr. Kirke Corin planned after short term rehab and recovery from hip fracture.    Left hip fracture -status post surgical repair on 2/22.  --d/c to SNF for rehab --Lovenox for DVT prophylaxis per orders --Toe-touch weightbearing on the left lower extremity --Follow-up in [facility] Ortho clinic 2 weeks for staple removal and x-ray --Analgesics as needed --Bowel regimen  Acute respiratory failure with hypoxia - has been requiring supplemental oxygen of 4-6 L/min in postop setting.   D-dimer elevated.  High risk for PE in setting of postop hip repair, immobility. 2/28: weaned 4>>3 L/min --Titrate O2 for sats > 88%, wean as tolerated --CTA chest is negative for PE but study was limited by motion --Incentive spirometry --Pulmonology consulted, Added prednisone, diuresis, recommend RHC  --d/c with oxygen currently 3 L/min, wean as able, keep O2 sat >88%  Acute on chronic HFpEF - s/p two days IV diuresis with good outputs.   --Discharge on daily torsemide for maintenace.  Microcytic Anemia - with acute blood loss contributing.   --CBC in 1 week to follow up.    Pulmonary Hypertension - as per CTA chest this admission.  Cardiology following , informed pulmonology would like RHC - will be done outpatient during evaluation for TAVR  COPD/emphysema - with mild exacerbation in post-op setting. --Discharge on short prednisone taper for mild exacerbation. --Follow up with pulmonology  Severe aortic stenosis -cardiology consulted.   Needs TAVR.   Blood pressures low/normal. --Coreg stopped --Outpatient follow-up for evaluation planned after rehab  Coronary artery  disease / History of inferior MI status post PCI -stable.  EF preserved. --Continue aspirin and statin --Beta-blocker has been stopped as above   Obesity: Body mass index is 39.94 kg/m.  Complicates  overall care and prognosis.    Discharge Instructions   Discharge Instructions    (HEART FAILURE PATIENTS) Call MD:  Anytime you have any of the following symptoms: 1) 3 pound weight gain in 24 hours or 5 pounds in 1 week 2) shortness of breath, with or without a dry hacking cough 3) swelling in the hands, feet or stomach 4) if you have to sleep on extra pillows at night in order to breathe.   Complete by: As directed    Call MD for:   Complete by: As directed    Worsening shortness of breath or needing to turn oxygen up higher than 4 L/min to keep O2 saturation level above 90%   Call MD for:  extreme fatigue   Complete by: As directed    Call MD for:  persistant dizziness or light-headedness   Complete by: As directed    Call MD for:  persistant nausea and vomiting   Complete by: As directed    Call MD for:  severe uncontrolled pain   Complete by: As directed    Call MD for:  temperature >100.4   Complete by: As directed    Diet - low sodium heart healthy   Complete by: As directed    Increase activity slowly   Complete by: As directed    Leave dressing on - Keep it clean, dry, and intact until clinic visit   Complete by: As directed    Redress as needed for soiling     Allergies as of 10/09/2020   No Known Allergies     Medication List    STOP taking these medications   carvedilol 3.125 MG tablet Commonly known as: COREG     TAKE these medications   acetaminophen 325 MG tablet Commonly known as: Tylenol Take 2 tablets (650 mg total) by mouth every 6 (six) hours as needed for mild pain, fever or headache.   aspirin 81 MG EC tablet Take 81 mg by mouth daily.   bisacodyl 5 MG EC tablet Commonly known as: DULCOLAX Take 1 tablet (5 mg total) by mouth daily as needed for moderate constipation.   docusate sodium 100 MG capsule Commonly known as: COLACE Take 1 capsule (100 mg total) by mouth 2 (two) times daily.   enoxaparin 40 MG/0.4ML injection Commonly known  as: LOVENOX Inject 0.4 mLs (40 mg total) into the skin daily for 14 days.   feeding supplement Liqd Take 237 mLs by mouth 2 (two) times daily between meals.   methocarbamol 500 MG tablet Commonly known as: ROBAXIN Take 1 tablet (500 mg total) by mouth every 6 (six) hours as needed for muscle spasms.   metoCLOPramide 5 MG tablet Commonly known as: REGLAN Take 1-2 tablets (5-10 mg total) by mouth every 8 (eight) hours as needed for nausea (if ondansetron (ZOFRAN) ineffective.).   midodrine 2.5 MG tablet Commonly known as: PROAMATINE Take 1 tablet (2.5 mg total) by mouth 2 (two) times daily with a meal.   multivitamin with minerals Tabs tablet Take 1 tablet by mouth daily. Start taking on: October 10, 2020   nitroGLYCERIN 0.4 MG SL tablet Commonly known as: NITROSTAT Place 1 tablet (0.4 mg total) under the tongue every 5 (five) minutes as needed for chest pain. Notes to patient: Not given  this hospitalization   ondansetron 4 MG tablet Commonly known as: ZOFRAN Take 1 tablet (4 mg total) by mouth every 6 (six) hours as needed for nausea.   oxyCODONE 5 MG immediate release tablet Commonly known as: Oxy IR/ROXICODONE Take 1-2 tablets (5-10 mg total) by mouth every 4 (four) hours as needed for up to 7 days for severe pain (pain score 7-10).   polyethylene glycol 17 g packet Commonly known as: MIRALAX / GLYCOLAX Take 17 g by mouth daily. Start taking on: October 10, 2020   predniSONE 10 MG tablet Commonly known as: DELTASONE Take 4 tablets (40 mg total) by mouth daily for 1 day, THEN 3 tablets (30 mg total) daily for 1 day, THEN 2 tablets (20 mg total) daily for 1 day, THEN 1 tablet (10 mg total) daily for 1 day. Start taking on: October 09, 2020   rosuvastatin 10 MG tablet Commonly known as: CRESTOR Take 1 tablet by mouth once daily   senna-docusate 8.6-50 MG tablet Commonly known as: Senokot-S Take 1 tablet by mouth 2 (two) times daily.   torsemide 10 MG tablet Commonly  known as: Demadex Take 1 tablet (10 mg total) by mouth daily.   traMADol 50 MG tablet Commonly known as: ULTRAM Take 1 tablet (50 mg total) by mouth every 6 (six) hours as needed for moderate pain.            Discharge Care Instructions  (From admission, onward)         Start     Ordered   10/09/20 0000  Leave dressing on - Keep it clean, dry, and intact until clinic visit       Comments: Redress as needed for soiling   10/09/20 1322          Contact information for follow-up providers    Dedra Skeens, PA-C Follow up in 2 week(s).   Specialty: Orthopedic Surgery Why: For staple removal and left femur x-rays Contact information: [address] [place] [address] [phone]        Vida Rigger, MD. Schedule an appointment as soon as possible for a visit in 1 month(s).   Specialty: Pulmonary Disease Why: Hospital follow up for COPD, pulmonary hypertension Contact information: [address] [place] [address] [phone]        Iran Ouch, MD. Call.   Specialty: Cardiology Why: Call to confirm next follow up for Contact information: [address] [place] [address] [phone]            Contact information for after-discharge care    Destination    [facility] Preferred SNF .   Service: Skilled Nursing Contact information: [address] [place] [address] [phone]                 No Known Allergies   If you experience worsening of your admission symptoms, develop shortness of breath, life threatening emergency, suicidal or homicidal thoughts you must seek medical attention immediately by calling 911 or calling your MD immediately  if symptoms less severe.    Please note   You were cared for by a hospitalist during your hospital stay. If you have any questions about your discharge medications or the care you received while you  were in the hospital after you are discharged, you can call the unit and asked to speak with the hospitalist on call if the hospitalist that took care of you  is not available. Once you are discharged, your primary care physician will handle any further medical issues. Please note that NO REFILLS for any discharge medications will be authorized once you are discharged, as it is imperative that you return to your primary care physician (or establish a relationship with a primary care physician if you do not have one) for your aftercare needs so that they can reassess your need for medications and monitor your lab values.   Consultations:  Cardiology  Pulmonology  Orthopedics    Procedures/Studies: CT ANGIO CHEST PE W OR WO CONTRAST  Result Date: 10/05/2020 CLINICAL DATA:  Elevated D-dimer, recent surgery EXAM: CT ANGIOGRAPHY CHEST WITH CONTRAST TECHNIQUE: Multidetector CT imaging of the chest was performed using the standard protocol during bolus administration of intravenous contrast. Multiplanar CT image reconstructions and MIPs were obtained to evaluate the vascular anatomy. CONTRAST:  75mL OMNIPAQUE IOHEXOL 350 MG/ML SOLN COMPARISON:  None. FINDINGS: Cardiovascular: Satisfactory opacification of the pulmonary arteries to the proximal segmental level. However, there is respiratory motion. Question of nonocclusive right basal subsegmental and left lower lobe segmental and subsegmental filling defects. Enlargement of the main pulmonary artery measuring 3.7 cm suggesting pulmonary arterial hypertension. Cardiomegaly. Coronary artery calcification. No pericardial effusion. Thoracic aorta is normal in caliber with mixed plaque. Mediastinum/Nodes: Mediastinal adenopathy is present. A prevascular node on image 26 of series 4 measures 1.7 cm in short axis. Subcarinal node on image 42 also measures 2 cm in short axis. Nonenlarged hilar nodes. Small hiatal hernia. Thyroid is unremarkable. Lungs/Pleura: Imaging  in expiration. Interstitial thickening and patchy ground-glass density. Bibasilar atelectasis. Upper Abdomen: No acute abnormality.  Cholelithiasis. Musculoskeletal: Degenerative changes of the spine. No acute osseous abnormality. Review of the MIP images confirms the above findings. IMPRESSION: No definite pulmonary embolism. Question of nonocclusive right basal subsegmental and left lower lobe segmental and subsegmental filling defects. There is significant respiratory motion and these could be artifactual. Bibasilar atelectasis. Interstitial thickening and patchy ground-glass density, which could reflect additional atelectasis (imaging in expiration), mild edema, or less likely atypical pneumonia. Mediastinal adenopathy. Cardiomegaly. Enlargement of the main pulmonary artery suggesting pulmonary arterial hypertension. Electronically Signed   By: Guadlupe Spanish M.D.   On: 10/05/2020 15:06   DG Hand 2 View Left  Result Date: 10/02/2020 CLINICAL DATA:  Left hand pain after fall. EXAM: LEFT HAND - 2 VIEW COMPARISON:  None. FINDINGS: There is no evidence of fracture or dislocation. Moderate degenerative changes seen involving the first carpometacarpal joint. Soft tissues are unremarkable. 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 No acute abnormality seen in the left hand. Electronically Signed   By: Lupita Raider M.D.   On: 10/02/2020 09:52   US Venous Img Lower Bilateral (DVT)  Result Date: 10/06/2020 CLINICAL DATA:  Hypoxia, acute respiratory failure EXAM: BILATERAL LOWER EXTREMITY VENOUS DOPPLER ULTRASOUND TECHNIQUE: Gray-scale sonography with compression, as well as color and duplex ultrasound, were performed to evaluate the deep venous system(s) from the level of the common femoral vein through the popliteal and proximal calf veins. COMPARISON:  04/29/2018 FINDINGS: VENOUS Normal compressibility of the common femoral, superficial femoral, and popliteal veins, as well as  the visualized calf veins. Visualized portions of profunda femoral vein and great saphenous vein unremarkable. No filling defects to suggest DVT on grayscale or color Doppler imaging. Doppler waveforms show normal direction of venous flow, normal respiratory phasicity and response to augmentation. Limited views of the contralateral common femoral vein are unremarkable. OTHER None. Limitations: Technologist describes  technically difficult study secondary to swelling and pain in the region of the left femoral vein. IMPRESSION: No femoropopliteal DVT nor evidence of DVT within the visualized calf veins. If clinical symptoms are inconsistent or if there are persistent or worsening symptoms, further imaging (possibly involving the iliac veins) may be warranted. Electronically Signed   By: Corlis Leak  Hassell M.D.   On: 10/06/2020 16:27   DG Chest Port 1 View  Result Date: 10/05/2020 CLINICAL DATA:  Acute respiratory failure EXAM: PORTABLE CHEST 1 VIEW COMPARISON:  10/02/2020 FINDINGS: Cardiac enlargement without heart failure. Prominent lung markings diffusely unchanged. No focal consolidation or effusion. Chronic right rib fractures. IMPRESSION: No interval change. Prominent interstitial lung markings bilaterally and diffusely could represent chronic lung disease however infection or interstitial edema possible. Electronically Signed   By: Marlan Palauharles  Clark M.D.   On: 10/05/2020 10:37   DG Chest Portable 1 View  Result Date: 10/02/2020 CLINICAL DATA:  Left hip fracture. EXAM: PORTABLE CHEST 1 VIEW COMPARISON:  June 05, 2017. FINDINGS: Mild cardiomegaly is noted. No pneumothorax pleural effusion is noted. Increased diffuse interstitial densities are noted throughout both lungs which may represent diffuse scarring, but acute superimposed atypical inflammation or edema cannot be excluded. Old right rib fractures are noted. IMPRESSION: Increased diffuse interstitial densities are noted throughout both lungs which may  represent diffuse scarring, but acute superimposed atypical inflammation or edema cannot be excluded. Electronically Signed   By: Lupita RaiderJames  Green Jr M.D.   On: 10/02/2020 09:50   ECHOCARDIOGRAM COMPLETE  Result Date: 10/03/2020    ECHOCARDIOGRAM REPORT   Patient Name:   Monica ArmourSYLVIA [name] Date of Exam: 10/03/2020 Medical Rec #:  [id number]      Height:       65.0 in Accession #:    [id number]     Weight:       240.0 lb Date of Birth:  1934-07-13      BSA:          2.138 m Patient Age:    86 years       BP:           95/71 mmHg Patient Gender: F              HR:           86 bpm. Exam Location:  [facility] Procedure: 2D Echo, Color Doppler, Cardiac Doppler and Intracardiac            Opacification Agent Indications:     I35.0 Aortic stenosis  History:         Patient has prior history of Echocardiogram examinations, most                  recent 10/26/2019. CAD, Signs/Symptoms:Broken hip; Risk                  Factors:Dyslipidemia.  Sonographer:     Humphrey RollsJoan Heiss RDCS (AE) Referring Phys:  [id number] Sondra BargesYAN M DUNN Diagnosing Phys: Yvonne Kendallhristopher End MD  Sonographer Comments: Suboptimal apical window. Image acquisition challenging due to patient body habitus. IMPRESSIONS  1. Left ventricular ejection fraction, by estimation, is 60 to 65%. The left ventricle has normal function. Left ventricular endocardial border not optimally defined to evaluate regional wall motion. There is mild left ventricular hypertrophy. Left ventricular diastolic parameters are consistent with Grade I diastolic dysfunction (impaired relaxation). Elevated left atrial pressure.  2. Right ventricular systolic function is normal. The right ventricular size is mildly enlarged. Tricuspid regurgitation signal is inadequate for  assessing PA pressure.  3. Left atrial size was mildly dilated.  4. Right atrial size was mildly dilated.  5. The mitral valve is degenerative. Trivial mitral valve regurgitation. Mild mitral stenosis. Moderate mitral annular calcification.  6.  Tricuspid valve regurgitation at least moderate but suboptimally visualized.  7. The aortic valve is tricuspid. There is moderate calcification of the aortic valve. There is severe thickening of the aortic valve. Aortic valve regurgitation is not visualized. Severe aortic valve stenosis. Aortic valve area, by VTI measures 0.51 cm. Aortic valve mean gradient measures 60.0 mmHg. Aortic valve Vmax measures 4.83 m/s.  8. The inferior vena cava is normal in size with greater than 50% respiratory variability, suggesting right atrial pressure of 3 mmHg. Comparison(s): Very severe aortic stenosis, worsened compared with prior echocardiogram from 10/26/2019. FINDINGS  Left Ventricle: Left ventricular ejection fraction, by estimation, is 60 to 65%. The left ventricle has normal function. Left ventricular endocardial border not optimally defined to evaluate regional wall motion. Definity contrast agent was given IV to delineate the left ventricular endocardial borders. The left ventricular internal cavity size was normal in size. There is mild left ventricular hypertrophy. Left ventricular diastolic parameters are consistent with Grade I diastolic dysfunction (impaired relaxation). Elevated left atrial pressure. Right Ventricle: The right ventricular size is mildly enlarged. No increase in right ventricular wall thickness. Right ventricular systolic function is normal. Tricuspid regurgitation signal is inadequate for assessing PA pressure. Left Atrium: Left atrial size was mildly dilated. Right Atrium: Right atrial size was mildly dilated. Pericardium: There is no evidence of pericardial effusion. Mitral Valve: The mitral valve is degenerative in appearance. There is mild thickening of the mitral valve leaflet(s). Moderate mitral annular calcification. Trivial mitral valve regurgitation. Mild mitral valve stenosis. MV peak gradient, 11.2 mmHg. The  mean mitral valve gradient is 5.0 mmHg. Tricuspid Valve: The tricuspid valve is  not well visualized. Tricuspid valve regurgitation at least moderate but suboptimally visualized. Aortic Valve: The aortic valve is tricuspid. There is moderate calcification of the aortic valve. There is severe thickening of the aortic valve. Aortic valve regurgitation is not visualized. Severe aortic stenosis is present. Aortic valve mean gradient measures 60.0 mmHg. Aortic valve peak gradient measures 93.3 mmHg. Aortic valve area, by VTI measures 0.51 cm. Pulmonic Valve: The pulmonic valve was not well visualized. Pulmonic valve regurgitation is not visualized. No evidence of pulmonic stenosis. Aorta: The aortic root is normal in size and structure. Pulmonary Artery: The pulmonary artery is not well seen. Venous: The inferior vena cava is normal in size with greater than 50% respiratory variability, suggesting right atrial pressure of 3 mmHg. IAS/Shunts: The interatrial septum was not well visualized.  LEFT VENTRICLE PLAX 2D LVIDd:         4.00 cm  Diastology LVIDs:         2.50 cm  LV e' medial:    8.38 cm/s LV PW:         1.36 cm  LV E/e' medial:  13.8 LV IVS:        1.09 cm  LV e' lateral:   5.44 cm/s LVOT diam:     1.90 cm  LV E/e' lateral: 21.3 LV SV:         51 LV SV Index:   24 LVOT Area:     2.84 cm  RIGHT VENTRICLE RV Basal diam:  4.60 cm LEFT ATRIUM           Index  RIGHT ATRIUM           Index LA diam:      3.90 cm 1.82 cm/m  RA Area:     23.00 cm LA Vol (A4C): 69.3 ml 32.42 ml/m RA Volume:   76.20 ml  35.65 ml/m  AORTIC VALVE                    PULMONIC VALVE AV Area (Vmax):    0.53 cm     PV Vmax:       1.23 m/s AV Area (Vmean):   0.53 cm     PV Vmean:      80.200 cm/s AV Area (VTI):     0.51 cm     PV VTI:        0.229 m AV Vmax:           483.00 cm/s  PV Peak grad:  6.1 mmHg AV Vmean:          346.000 cm/s PV Mean grad:  3.0 mmHg AV VTI:            1.007 m AV Peak Grad:      93.3 mmHg AV Mean Grad:      60.0 mmHg LVOT Vmax:         89.50 cm/s LVOT Vmean:        64.100 cm/s LVOT VTI:           0.181 m LVOT/AV VTI ratio: 0.18  AORTA Ao Root diam: 3.40 cm MITRAL VALVE MV Area (PHT): 7.29 cm     SHUNTS MV Area VTI:   1.93 cm     Systemic VTI:  0.18 m MV Peak grad:  11.2 mmHg    Systemic Diam: 1.90 cm MV Mean grad:  5.0 mmHg MV Vmax:       1.67 m/s MV Vmean:      102.0 cm/s MV Decel Time: 104 msec MV E velocity: 116.00 cm/s MV A velocity: 164.00 cm/s MV E/A ratio:  0.71 Cristal Deer End MD Electronically signed by Yvonne Kendall MD Signature Date/Time: 10/03/2020/9:54:38 AM    Final    DG HIP OPERATIVE UNILAT W OR W/O PELVIS LEFT  Result Date: 10/03/2020 CLINICAL DATA:  Intramedullary nail on the LEFT. EXAM: OPERATIVE LEFT HIP (WITH PELVIS IF PERFORMED)  VIEWS TECHNIQUE: Fluoroscopic spot image(s) were submitted for interpretation post-operatively. COMPARISON:  October 02, 2020 FINDINGS: Six intraoperative fluoroscopic views with changes of intramedullary rod placement and screw fixation across the femoral neck spanning a comminuted intertrochanteric fracture of the LEFT proximal femur. Midportion of the device is not imaged. Distal interlocking screws of been placed in the distal femur within the metadiaphyseal region. Proximally there is a compression type screw in a cannulated lag screw in place across the femoral neck. Persistent distraction of the lesser trochanteric fragment. IMPRESSION: Post ORIF of LEFT intertrochanteric fracture with intramedullary nailing and fixation devices as described. Midportion of the devices are not imaged.  Attention on follow-up. Electronically Signed   By: Donzetta Kohut M.D.   On: 10/03/2020 22:21   DG Hip Unilat W or Wo Pelvis 2-3 Views Left  Result Date: 10/02/2020 CLINICAL DATA:  Left hip pain after fall. EXAM: DG HIP (WITH OR WITHOUT PELVIS) 2-3V LEFT COMPARISON:  None. FINDINGS: Severely comminuted and displaced fracture is seen involving the intertrochanteric region of the proximal left femur. IMPRESSION: Severely comminuted and displaced  intertrochanteric fracture of proximal left femur. Electronically Signed   By: Roque Lias  Jr M.D.   On: 10/02/2020 09:49   US Abdomen Limited RUQ (LIVER/GB)  Result Date: 10/07/2020 CLINICAL DATA:  Hyperbilirubinemia EXAM: ULTRASOUND ABDOMEN LIMITED RIGHT UPPER QUADRANT COMPARISON:  None. FINDINGS: Gallbladder: Multiple mobile gallstones measuring up to 1.7 cm. No pericholecystic fluid or wall thickening visualized. No sonographic Murphy sign noted by sonographer. Common bile duct: Diameter: 7 mm which is within normal limits for patient's age. Liver: No focal lesion identified. Within normal limits in parenchymal echogenicity. Portal vein is patent on color Doppler imaging with normal direction of blood flow towards the liver. Other: None. IMPRESSION: Cholelithiasis without sonographic evidence of acute cholecystitis. Electronically Signed   By: Maudry Mayhew MD   On: 10/07/2020 14:39      Hip fracture surgery   Subjective: Pt feels fairly well.  Pain controlled.  Not SOB or CP.  Making good urine output, but seems less today.   Discharge Exam: Vitals:   10/09/20 1146 10/09/20 1211  BP: (!) 99/57 107/68  Pulse: (!) 105 93  Resp: 18 14  Temp: (!) 97.5 F (36.4 C) 98 F (36.7 C)  SpO2: 94% 91%   Vitals:   10/09/20 0935 10/09/20 1129 10/09/20 1146 10/09/20 1211  BP: (!) 97/57 117/65 (!) 99/57 107/68  Pulse: 100 (!) 105 (!) 105 93  Resp: 16 20 18 14   Temp: 98.2 F (36.8 C) 98.3 F (36.8 C) (!) 97.5 F (36.4 C) 98 F (36.7 C)  TempSrc: Oral  Oral Oral  SpO2: 93% 94% 94% 91%  Weight:      Height:        General: Pt is alert, awake, not in acute distress, obese Cardiovascular: RRR, S1/S2 +, no rubs, no gallops Respiratory: diminished bases, no wheezing, no rhonchi, on 3 L/min Abdominal: Soft, NT, ND, bowel sounds + Extremities: no edema, no cyanosis. L lateral hip surgical site with no surrounding warmth, erythema or induration.    The results of significant diagnostics  from this hospitalization (including imaging, microbiology, ancillary and laboratory) are listed below for reference.     Microbiology: Recent Results (from the past 240 hour(s))  Resp Panel by RT-PCR (Flu A&B, Covid) Nasopharyngeal Swab     Status: None   Collection Time: 10/02/20  9:07 AM   Specimen: Nasopharyngeal Swab; Nasopharyngeal(NP) swabs in vial transport medium  Result Value Ref Range Status   SARS Coronavirus 2 by RT PCR NEGATIVE NEGATIVE Final    Comment: (NOTE) SARS-CoV-2 target nucleic acids are NOT DETECTED.  The SARS-CoV-2 RNA is generally detectable in upper respiratory specimens during the acute phase of infection. The lowest concentration of SARS-CoV-2 viral copies this assay can detect is 138 copies/mL. A negative result does not preclude SARS-Cov-2 infection and should not be used as the sole basis for treatment or other patient management decisions. A negative result may occur with  improper specimen collection/handling, submission of specimen other than nasopharyngeal swab, presence of viral mutation(s) within the areas targeted by this assay, and inadequate number of viral copies(<138 copies/mL). A negative result must be combined with clinical observations, patient history, and epidemiological information. The expected result is Negative.  Fact Sheet for Patients:  BloggerCourse.com  Fact Sheet for Healthcare Providers:  SeriousBroker.it  This test is no t yet approved or cleared by the [place] FDA and  has been authorized for detection and/or diagnosis of SARS-CoV-2 by FDA under an Emergency Use Authorization (EUA). This EUA will remain  in effect (meaning this test can be used) for the duration  of the COVID-19 declaration under Section 564(b)(1) of the Act, 21 U.S.C.section 360bbb-3(b)(1), unless the authorization is terminated  or revoked sooner.       Influenza A by PCR NEGATIVE NEGATIVE  Final   Influenza B by PCR NEGATIVE NEGATIVE Final    Comment: (NOTE) The Xpert Xpress SARS-CoV-2/FLU/RSV plus assay is intended as an aid in the diagnosis of influenza from Nasopharyngeal swab specimens and should not be used as a sole basis for treatment. Nasal washings and aspirates are unacceptable for Xpert Xpress SARS-CoV-2/FLU/RSV testing.  Fact Sheet for Patients: BloggerCourse.com  Fact Sheet for Healthcare Providers: SeriousBroker.it  This test is not yet approved or cleared by the [place] FDA and has been authorized for detection and/or diagnosis of SARS-CoV-2 by FDA under an Emergency Use Authorization (EUA). This EUA will remain in effect (meaning this test can be used) for the duration of the COVID-19 declaration under Section 564(b)(1) of the Act, 21 U.S.C. section 360bbb-3(b)(1), unless the authorization is terminated or revoked.  Performed at [facility], [address] [place], [place] [address]      Labs: BNP (last 3 results) Recent Labs    10/02/20 0907 10/05/20 0441  BNP 178.2* 79.2   Basic Metabolic Panel: Recent Labs  Lab 10/04/20 0423 10/05/20 0441 10/06/20 0434 10/07/20 0345 10/08/20 0555 10/09/20 0522  NA 132* 134* 134* 136 132* 133*  K 4.7 4.8 4.9 4.8 4.4 4.4  CL 102 102 101 99 95* 94*  CO2 GLUCOSE 129* 120* 118* 119* 116* 146*  BUN 19 26* 27*  CREATININE 0.91 0.88 0.75 0.68 0.80 0.87  CALCIUM 7.9* 7.9* 8.2* 8.4* 8.2* 8.4*  MG 1.9 2.2  --   --   --  2.1  PHOS 3.2 2.8  --   --   --  3.5   Liver Function Tests: Recent Labs  Lab 10/05/20 0441 10/06/20 0434 10/07/20 0345 10/08/20 0555 10/08/20 1000 10/09/20 0522  AST 43* 38 42* 38  --  40  ALT --  23  ALKPHOS 42 46 53 54  --  61  BILITOT 1.6* 1.9* 2.4* 2.7* 3.0* 2.6*  PROT 5.7* 6.0* 6.2* 6.1*  --  6.5  ALBUMIN 2.6* 2.5* 2.6* 2.7*  --  2.7*   No results for input(s):  LIPASE, AMYLASE in the last 168 hours. No results for input(s): AMMONIA in the last 168 hours. CBC: Recent Labs  Lab 10/05/20 0441 10/06/20 0434 10/06/20 1249 10/07/20 0345 10/08/20 0555 10/09/20 0522  WBC 10.4 11.3*  --  10.8* 9.4 11.0*  NEUTROABS 6.8 6.3  --  6.0 5.5 8.3*  HGB 8.5* 7.8* 8.2* 8.4* 7.8* 8.5*  HCT 25.8* 25.1* 26.5* 26.8* 24.8* 26.3*  MCV 86.9 88.7  --  89.3 88.3 86.2  PLT 145* 163  --  196 223 303   Cardiac Enzymes: No results for input(s): CKTOTAL, CKMB, CKMBINDEX, TROPONINI in the last 168 hours. BNP: Invalid input(s): POCBNP CBG: No results for input(s): GLUCAP in the last 168 hours. D-Dimer Recent Labs    10/07/20 1254  DDIMER 4.62*   Hgb A1c No results for input(s): HGBA1C in the last 72 hours. Lipid Profile No results for input(s): CHOL, HDL, LDLCALC, TRIG, CHOLHDL, LDLDIRECT in the last 72 hours. Thyroid function studies No results for input(s): TSH, T4TOTAL, T3FREE, THYROIDAB in the last 72 hours.  Invalid input(s): FREET3 Anemia work up No results for input(s): VITAMINB12,  FOLATE, FERRITIN, TIBC, IRON, RETICCTPCT in the last 72 hours. Urinalysis    Component Value Date/Time   COLORURINE YELLOW (A) 04/29/2018 1357   APPEARANCEUR CLEAR (A) 04/29/2018 1357   LABSPEC 1.010 04/29/2018 1357   PHURINE 7.0 04/29/2018 1357   GLUCOSEU NEGATIVE 04/29/2018 1357   HGBUR SMALL (A) 04/29/2018 1357   BILIRUBINUR NEGATIVE 04/29/2018 1357   KETONESUR 5 (A) 04/29/2018 1357   PROTEINUR NEGATIVE 04/29/2018 1357   NITRITE NEGATIVE 04/29/2018 1357   LEUKOCYTESUR NEGATIVE 04/29/2018 1357   Sepsis Labs Invalid input(s): PROCALCITONIN,  WBC,  LACTICIDVEN Microbiology Recent Results (from the past 240 hour(s))  Resp Panel by RT-PCR (Flu A&B, Covid) Nasopharyngeal Swab     Status: None   Collection Time: 10/02/20  9:07 AM   Specimen: Nasopharyngeal Swab; Nasopharyngeal(NP) swabs in vial transport medium  Result Value Ref Range Status   SARS Coronavirus 2 by  RT PCR NEGATIVE NEGATIVE Final    Comment: (NOTE) SARS-CoV-2 target nucleic acids are NOT DETECTED.  The SARS-CoV-2 RNA is generally detectable in upper respiratory specimens during the acute phase of infection. The lowest concentration of SARS-CoV-2 viral copies this assay can detect is 138 copies/mL. A negative result does not preclude SARS-Cov-2 infection and should not be used as the sole basis for treatment or other patient management decisions. A negative result may occur with  improper specimen collection/handling, submission of specimen other than nasopharyngeal swab, presence of viral mutation(s) within the areas targeted by this assay, and inadequate number of viral copies(<138 copies/mL). A negative result must be combined with clinical observations, patient history, and epidemiological information. The expected result is Negative.  Fact Sheet for Patients:  BloggerCourse.com  Fact Sheet for Healthcare Providers:  SeriousBroker.it  This test is no t yet approved or cleared by the [place] FDA and  has been authorized for detection and/or diagnosis of SARS-CoV-2 by FDA under an Emergency Use Authorization (EUA). This EUA will remain  in effect (meaning this test can be used) for the duration of the COVID-19 declaration under Section 564(b)(1) of the Act, 21 U.S.C.section 360bbb-3(b)(1), unless the authorization is terminated  or revoked sooner.       Influenza A by PCR NEGATIVE NEGATIVE Final   Influenza B by PCR NEGATIVE NEGATIVE Final    Comment: (NOTE) The Xpert Xpress SARS-CoV-2/FLU/RSV plus assay is intended as an aid in the diagnosis of influenza from Nasopharyngeal swab specimens and should not be used as a sole basis for treatment. Nasal washings and aspirates are unacceptable for Xpert Xpress SARS-CoV-2/FLU/RSV testing.  Fact Sheet for Patients: BloggerCourse.com  Fact Sheet  for Healthcare Providers: SeriousBroker.it  This test is not yet approved or cleared by the [place] FDA and has been authorized for detection and/or diagnosis of SARS-CoV-2 by FDA under an Emergency Use Authorization (EUA). This EUA will remain in effect (meaning this test can be used) for the duration of the COVID-19 declaration under Section 564(b)(1) of the Act, 21 U.S.C. section 360bbb-3(b)(1), unless the authorization is terminated or revoked.  Performed at [facility], [address] [place], [place] [address]      Time coordinating discharge: Over 30 minutes  SIGNED:   Pennie Banter, DO [facility] 10/09/2020, 1:27 PM   If 7PM-7AM, please contact night-coverage [email or web address]

## 2020-10-09 NOTE — Progress Notes (Signed)
Progress Note  Patient Name: Monica [name] Date of Encounter: 10/09/2020  [facility] - [facility] Cardiologist: Lorine Bears, MD   Subjective   She feels reasonably well with no chest pain or significant dyspnea.  We were consulted for possible need for of right heart catheterization given persistent hypoxia requiring oxygen.  She is still requiring 2 to 3 L nasal cannula with oxygen saturation around 94%.  Inpatient Medications    Scheduled Meds: . acetaminophen  1,000 mg Oral Q8H  . aspirin EC  81 mg Oral Daily  . bumetanide (BUMEX) IV  1 mg Intravenous Q12H  . Chlorhexidine Gluconate Cloth  6 each Topical Daily  . docusate sodium  100 mg Oral BID  . enoxaparin (LOVENOX) injection  40 mg Subcutaneous Q24H  . feeding supplement  237 mL Oral BID BM  . multivitamin with minerals  1 tablet Oral Daily  . pantoprazole  40 mg Oral BID  . polyethylene glycol  17 g Oral Daily  . [START ON 10/10/2020] predniSONE  40 mg Oral Q breakfast   Followed by  . [START ON 10/11/2020] predniSONE  30 mg Oral Q breakfast   Followed by  . [START ON 10/12/2020] predniSONE  20 mg Oral Q breakfast   Followed by  . [START ON 10/13/2020] predniSONE  10 mg Oral Q breakfast  . rosuvastatin  10 mg Oral Daily  . senna-docusate  1 tablet Oral BID  . traZODone  50 mg Oral QHS   Continuous Infusions: . methocarbamol (ROBAXIN) IV     PRN Meds: bisacodyl, bisacodyl, methocarbamol **OR** methocarbamol (ROBAXIN) IV, metoCLOPramide **OR** metoCLOPramide (REGLAN) injection, ondansetron **OR** ondansetron (ZOFRAN) IV, oxyCODONE, oxyCODONE, sodium phosphate, traMADol   Vital Signs    Vitals:   10/09/20 0406 10/09/20 0736 10/09/20 0935 10/09/20 1129  BP: 118/65 118/78 (!) 97/57 117/65  Pulse: (!) 50 (!) 51 100 (!) 105  Resp: 18 15 16 20   Temp: 98.4 F (36.9 C) 98.4 F (36.9 C) 98.2 F (36.8 C) 98.3 F (36.8 C)  TempSrc:   Oral   SpO2: 95% 95% 93% 94%  Weight:      Height:        Intake/Output Summary (Last 24  hours) at 10/09/2020 1152 Last data filed at 10/09/2020 1040 Gross per 24 hour  Intake 240 ml  Output 1800 ml  Net -1560 ml   Last 3 Weights 10/02/2020 05/16/2020 11/16/2019  Weight (lbs) 240 lb 256 lb 240 lb  Weight (kg) 108.863 kg 116.121 kg 108.863 kg      Telemetry      ECG     - Personally Reviewed  Physical Exam   GEN: No acute distress.  Obese, laying supine in bed Neck:  Unable to estimate JVD Cardiac: RRR, 3/6 SEM in the aortic area which is late peaking with absent S2., rubs, or gallops.  Respiratory: Clear to auscultation bilaterally. GI: Soft, nontender, non-distended  MS: No edema; No deformity. Neuro:  Nonfocal  Psych: Normal affect   Labs    High Sensitivity Troponin:   Recent Labs  Lab 10/02/20 0907 10/02/20 1153  TROPONINIHS 14 12      Chemistry Recent Labs  Lab 10/07/20 0345 10/08/20 0555 10/08/20 1000 10/09/20 0522  NA 136 132*  --  133*  K 4.8 4.4  --  4.4  CL 99 95*  --  94*  CO2 30 30  --  29  GLUCOSE 119* 116*  --  146*  BUN 16 19  --  27*  CREATININE 0.68 0.80  --  0.87  CALCIUM 8.4* 8.2*  --  8.4*  PROT 6.2* 6.1*  --  6.5  ALBUMIN 2.6* 2.7*  --  2.7*  AST 42* 38  --  40  ALT 17 18  --  23  ALKPHOS 53 54  --  61  BILITOT 2.4* 2.7* 3.0* 2.6*  GFRNONAA >60 >60  --  >60  ANIONGAP 7 7  --  10     Hematology Recent Labs  Lab 10/07/20 0345 10/08/20 0555 10/09/20 0522  WBC 10.8* 9.4 11.0*  RBC 3.00* 2.81* 3.05*  HGB 8.4* 7.8* 8.5*  HCT 26.8* 24.8* 26.3*  MCV 89.3 88.3 86.2  MCH 28.0 27.8 27.9  MCHC 31.3 31.5 32.3  RDW 14.8 14.7 14.8  PLT 196 223 303    BNP Recent Labs  Lab 10/05/20 0441  BNP 79.2     DDimer  Recent Labs  Lab 10/05/20 0946 10/07/20 1254  DDIMER 5.73* 4.62*     Radiology    US Abdomen Limited RUQ (LIVER/GB)  Result Date: 10/07/2020 CLINICAL DATA:  Hyperbilirubinemia EXAM: ULTRASOUND ABDOMEN LIMITED RIGHT UPPER QUADRANT COMPARISON:  None. FINDINGS: Gallbladder: Multiple mobile gallstones  measuring up to 1.7 cm. No pericholecystic fluid or wall thickening visualized. No sonographic Murphy sign noted by sonographer. Common bile duct: Diameter: 7 mm which is within normal limits for patient's age. Liver: No focal lesion identified. Within normal limits in parenchymal echogenicity. Portal vein is patent on color Doppler imaging with normal direction of blood flow towards the liver. Other: None. IMPRESSION: Cholelithiasis without sonographic evidence of acute cholecystitis. Electronically Signed   By: Maudry Mayhew MD   On: 10/07/2020 14:39    Cardiac Studies   Echocardiogram on February 22: Normal LV systolic function with severe aortic stenosis.  Mean gradient was 60 mmHg with valve area of 0.51.  Patient Profile     87 y.o. female history of severe aortic stenosis presenting with a fall, found to have left hip fracture status post surgical repair.  Assessment & Plan    1.  Severe AS Almost critical AS, details discussed with her I do think she is symptomatic and requires evaluation for TAVR in the near future. The plan is to proceed with a right and left cardiac catheterization in few weeks once she recovers from hip surgery.  Hopefully she will be a little bit stronger before TAVR. If her symptoms become unstable, we can expedite the work-up as an inpatient.  She seems to be doing reasonably well at the present time with the exception of hypoxia which is likely multifactorial due to her lung disease and some element of heart failure.  She is currently on Bumex 1 mg IV twice daily but I am going to change this to once daily to avoid overdiuresis in the setting of severe aortic stenosis and relatively low blood pressure.  We should aim to discharge her without the diuretic if possible.  2.  CAD/inferior MI status post PCI. Normal ejection fraction, she denies any angina We will continue aspirin, statin  3.  Left hip fracture s/p repair -Management per surgical team Also  with left upper extremity pain from fall  4.  Hypotension Carvedilol was discontinued during this admission for this reason.  I discussed the case with Dr. Karna Christmas   Total encounter time more than 35 minutes  Greater than 50% was spent in counseling and coordination of care with the patient     For questions or  updates, please contact [facility] Please consult [email or web address] for contact info under        Signed, Lorine Bears, MD  10/09/2020, 11:52 AM

## 2020-10-09 NOTE — TOC Transition Note (Signed)
Transition of Care [facility] - CM/SW Discharge Note   Patient Details  Name: Monica [name] MRN: [id number] Date of Birth: 08-Nov-1933  [facility] CM/SW Contact:  Liliana Cline, LCSW Phone Number: 10/09/2020, 2:38 PM   Clinical Narrative:   Patient to discharge to [facility] today, Room 705. Confirmed with Thayer Ohm. CSW updated MD, RN, patient;s daughter Cecilie Lowers. Asked RN to call report and MD to submit DC Summary. Medical Necessity Form and Face Sheet placed in Discharge Packet by patient chart. [facility] arranged for 3:30 (next available time). No other needs identified prior to discharge.     Final next level of care: Skilled Nursing Facility Barriers to Discharge: Barriers Resolved   Patient Goals and CMS Choice Patient states their goals for this hospitalization and ongoing recovery are:: SNF rehab CMS Medicare.gov Compare Post Acute Care list provided to:: Patient Choice offered to / list presented to : Patient  Discharge Placement              Patient chooses bed at: [facility] Patient to be transferred to facility by: [facility] Name of family member notified: Cecilie Lowers - daughter Patient and family notified of of transfer: 10/09/20  Discharge Plan and Services     Post Acute Care Choice:  (TBD)                               Social Determinants of Health (SDOH) Interventions     Readmission Risk Interventions No flowsheet data found.

## 2020-10-10 ENCOUNTER — Other Ambulatory Visit: Payer: Medicare Other

## 2020-10-12 ENCOUNTER — Ambulatory Visit: Payer: Medicare Other | Admitting: Family

## 2020-10-27 ENCOUNTER — Ambulatory Visit: Payer: Medicare Other | Admitting: Physician Assistant

## 2020-10-30 ENCOUNTER — Encounter: Payer: Self-pay | Admitting: Physician Assistant

## 2021-01-15 ENCOUNTER — Inpatient Hospital Stay
Admission: EM | Admit: 2021-01-15 | Discharge: 2021-02-10 | DRG: 856 | Disposition: A | Discharge status: Skilled Nursing Facility | Payer: Medicare Other | Attending: Internal Medicine | Admitting: Internal Medicine

## 2021-01-15 ENCOUNTER — Other Ambulatory Visit: Payer: Self-pay

## 2021-01-15 ENCOUNTER — Encounter: Payer: Self-pay | Admitting: Radiology

## 2021-01-15 ENCOUNTER — Emergency Department: Payer: Medicare Other

## 2021-01-15 DIAGNOSIS — I5021 Acute systolic (congestive) heart failure: Secondary | ICD-10-CM | POA: Diagnosis not present

## 2021-01-15 DIAGNOSIS — J9621 Acute and chronic respiratory failure with hypoxia: Secondary | ICD-10-CM | POA: Diagnosis not present

## 2021-01-15 DIAGNOSIS — J449 Chronic obstructive pulmonary disease, unspecified: Secondary | ICD-10-CM | POA: Diagnosis present

## 2021-01-15 DIAGNOSIS — I714 Abdominal aortic aneurysm, without rupture, unspecified: Secondary | ICD-10-CM

## 2021-01-15 DIAGNOSIS — Z01810 Encounter for preprocedural cardiovascular examination: Secondary | ICD-10-CM | POA: Diagnosis not present

## 2021-01-15 DIAGNOSIS — J9622 Acute and chronic respiratory failure with hypercapnia: Secondary | ICD-10-CM | POA: Diagnosis not present

## 2021-01-15 DIAGNOSIS — I420 Dilated cardiomyopathy: Secondary | ICD-10-CM | POA: Diagnosis not present

## 2021-01-15 DIAGNOSIS — Z978 Presence of other specified devices: Secondary | ICD-10-CM

## 2021-01-15 DIAGNOSIS — J811 Chronic pulmonary edema: Secondary | ICD-10-CM | POA: Diagnosis not present

## 2021-01-15 DIAGNOSIS — Z7982 Long term (current) use of aspirin: Secondary | ICD-10-CM

## 2021-01-15 DIAGNOSIS — T8142XA Infection following a procedure, deep incisional surgical site, initial encounter: Principal | ICD-10-CM | POA: Diagnosis present

## 2021-01-15 DIAGNOSIS — I2510 Atherosclerotic heart disease of native coronary artery without angina pectoris: Secondary | ICD-10-CM | POA: Diagnosis not present

## 2021-01-15 DIAGNOSIS — J8410 Pulmonary fibrosis, unspecified: Secondary | ICD-10-CM | POA: Diagnosis not present

## 2021-01-15 DIAGNOSIS — R1030 Lower abdominal pain, unspecified: Secondary | ICD-10-CM | POA: Diagnosis not present

## 2021-01-15 DIAGNOSIS — E66813 Obesity, class 3: Secondary | ICD-10-CM

## 2021-01-15 DIAGNOSIS — L0291 Cutaneous abscess, unspecified: Secondary | ICD-10-CM | POA: Diagnosis not present

## 2021-01-15 DIAGNOSIS — I959 Hypotension, unspecified: Secondary | ICD-10-CM | POA: Diagnosis not present

## 2021-01-15 DIAGNOSIS — Z833 Family history of diabetes mellitus: Secondary | ICD-10-CM

## 2021-01-15 DIAGNOSIS — R0602 Shortness of breath: Secondary | ICD-10-CM

## 2021-01-15 DIAGNOSIS — Z6839 Body mass index (BMI) 39.0-39.9, adult: Secondary | ICD-10-CM | POA: Diagnosis not present

## 2021-01-15 DIAGNOSIS — R42 Dizziness and giddiness: Secondary | ICD-10-CM | POA: Diagnosis not present

## 2021-01-15 DIAGNOSIS — E876 Hypokalemia: Secondary | ICD-10-CM | POA: Diagnosis not present

## 2021-01-15 DIAGNOSIS — D509 Iron deficiency anemia, unspecified: Secondary | ICD-10-CM | POA: Diagnosis present

## 2021-01-15 DIAGNOSIS — I358 Other nonrheumatic aortic valve disorders: Secondary | ICD-10-CM | POA: Diagnosis present

## 2021-01-15 DIAGNOSIS — I272 Pulmonary hypertension, unspecified: Secondary | ICD-10-CM | POA: Diagnosis not present

## 2021-01-15 DIAGNOSIS — R7881 Bacteremia: Secondary | ICD-10-CM | POA: Diagnosis present

## 2021-01-15 DIAGNOSIS — Z96642 Presence of left artificial hip joint: Secondary | ICD-10-CM | POA: Diagnosis present

## 2021-01-15 DIAGNOSIS — R0902 Hypoxemia: Secondary | ICD-10-CM | POA: Diagnosis not present

## 2021-01-15 DIAGNOSIS — T82310A Breakdown (mechanical) of aortic (bifurcation) graft (replacement), initial encounter: Secondary | ICD-10-CM | POA: Diagnosis not present

## 2021-01-15 DIAGNOSIS — E871 Hypo-osmolality and hyponatremia: Secondary | ICD-10-CM | POA: Diagnosis present

## 2021-01-15 DIAGNOSIS — Z66 Do not resuscitate: Secondary | ICD-10-CM | POA: Diagnosis not present

## 2021-01-15 DIAGNOSIS — I723 Aneurysm of iliac artery: Secondary | ICD-10-CM | POA: Diagnosis present

## 2021-01-15 DIAGNOSIS — B373 Candidiasis of vulva and vagina: Secondary | ICD-10-CM | POA: Diagnosis not present

## 2021-01-15 DIAGNOSIS — Y712 Prosthetic and other implants, materials and accessory cardiovascular devices associated with adverse incidents: Secondary | ICD-10-CM | POA: Diagnosis not present

## 2021-01-15 DIAGNOSIS — Z515 Encounter for palliative care: Secondary | ICD-10-CM | POA: Diagnosis not present

## 2021-01-15 DIAGNOSIS — I25118 Atherosclerotic heart disease of native coronary artery with other forms of angina pectoris: Secondary | ICD-10-CM | POA: Diagnosis not present

## 2021-01-15 DIAGNOSIS — T8131XA Disruption of external operation (surgical) wound, not elsewhere classified, initial encounter: Secondary | ICD-10-CM | POA: Diagnosis present

## 2021-01-15 DIAGNOSIS — I060 Rheumatic aortic stenosis: Secondary | ICD-10-CM | POA: Diagnosis not present

## 2021-01-15 DIAGNOSIS — L304 Erythema intertrigo: Secondary | ICD-10-CM | POA: Diagnosis not present

## 2021-01-15 DIAGNOSIS — Z955 Presence of coronary angioplasty implant and graft: Secondary | ICD-10-CM

## 2021-01-15 DIAGNOSIS — N179 Acute kidney failure, unspecified: Secondary | ICD-10-CM | POA: Diagnosis not present

## 2021-01-15 DIAGNOSIS — Z7189 Other specified counseling: Secondary | ICD-10-CM | POA: Diagnosis not present

## 2021-01-15 DIAGNOSIS — Z9981 Dependence on supplemental oxygen: Secondary | ICD-10-CM

## 2021-01-15 DIAGNOSIS — T8149XA Infection following a procedure, other surgical site, initial encounter: Secondary | ICD-10-CM | POA: Diagnosis not present

## 2021-01-15 DIAGNOSIS — A4902 Methicillin resistant Staphylococcus aureus infection, unspecified site: Secondary | ICD-10-CM | POA: Diagnosis not present

## 2021-01-15 DIAGNOSIS — Z87891 Personal history of nicotine dependence: Secondary | ICD-10-CM

## 2021-01-15 DIAGNOSIS — I10 Essential (primary) hypertension: Secondary | ICD-10-CM | POA: Diagnosis not present

## 2021-01-15 DIAGNOSIS — I5043 Acute on chronic combined systolic (congestive) and diastolic (congestive) heart failure: Secondary | ICD-10-CM | POA: Diagnosis not present

## 2021-01-15 DIAGNOSIS — K5900 Constipation, unspecified: Secondary | ICD-10-CM | POA: Diagnosis not present

## 2021-01-15 DIAGNOSIS — R54 Age-related physical debility: Secondary | ICD-10-CM | POA: Diagnosis present

## 2021-01-15 DIAGNOSIS — E785 Hyperlipidemia, unspecified: Secondary | ICD-10-CM | POA: Diagnosis present

## 2021-01-15 DIAGNOSIS — I350 Nonrheumatic aortic (valve) stenosis: Secondary | ICD-10-CM | POA: Diagnosis not present

## 2021-01-15 DIAGNOSIS — L03116 Cellulitis of left lower limb: Secondary | ICD-10-CM | POA: Diagnosis present

## 2021-01-15 DIAGNOSIS — Z01818 Encounter for other preprocedural examination: Secondary | ICD-10-CM | POA: Diagnosis not present

## 2021-01-15 DIAGNOSIS — I5020 Unspecified systolic (congestive) heart failure: Secondary | ICD-10-CM

## 2021-01-15 DIAGNOSIS — R079 Chest pain, unspecified: Secondary | ICD-10-CM | POA: Diagnosis not present

## 2021-01-15 DIAGNOSIS — N183 Chronic kidney disease, stage 3 unspecified: Secondary | ICD-10-CM | POA: Diagnosis present

## 2021-01-15 DIAGNOSIS — I2721 Secondary pulmonary arterial hypertension: Secondary | ICD-10-CM | POA: Diagnosis present

## 2021-01-15 DIAGNOSIS — L02416 Cutaneous abscess of left lower limb: Secondary | ICD-10-CM | POA: Diagnosis present

## 2021-01-15 DIAGNOSIS — I255 Ischemic cardiomyopathy: Secondary | ICD-10-CM | POA: Diagnosis present

## 2021-01-15 DIAGNOSIS — B9562 Methicillin resistant Staphylococcus aureus infection as the cause of diseases classified elsewhere: Secondary | ICD-10-CM | POA: Diagnosis present

## 2021-01-15 DIAGNOSIS — I513 Intracardiac thrombosis, not elsewhere classified: Secondary | ICD-10-CM | POA: Diagnosis present

## 2021-01-15 DIAGNOSIS — Z8249 Family history of ischemic heart disease and other diseases of the circulatory system: Secondary | ICD-10-CM

## 2021-01-15 DIAGNOSIS — I214 Non-ST elevation (NSTEMI) myocardial infarction: Secondary | ICD-10-CM

## 2021-01-15 DIAGNOSIS — Z20822 Contact with and (suspected) exposure to covid-19: Secondary | ICD-10-CM | POA: Diagnosis present

## 2021-01-15 DIAGNOSIS — I252 Old myocardial infarction: Secondary | ICD-10-CM

## 2021-01-15 DIAGNOSIS — I5022 Chronic systolic (congestive) heart failure: Secondary | ICD-10-CM | POA: Diagnosis not present

## 2021-01-15 DIAGNOSIS — G8929 Other chronic pain: Secondary | ICD-10-CM | POA: Diagnosis present

## 2021-01-15 DIAGNOSIS — Z79899 Other long term (current) drug therapy: Secondary | ICD-10-CM

## 2021-01-15 LAB — CBC
HCT: 41.5 % (ref 36.0–46.0)
Hemoglobin: 12.6 g/dL (ref 12.0–15.0)
MCH: 23.6 pg — ABNORMAL LOW (ref 26.0–34.0)
MCHC: 30.4 g/dL (ref 30.0–36.0)
MCV: 77.7 fL — ABNORMAL LOW (ref 80.0–100.0)
Platelets: 316 10*3/uL (ref 150–400)
RBC: 5.34 MIL/uL — ABNORMAL HIGH (ref 3.87–5.11)
RDW: 18.3 % — ABNORMAL HIGH (ref 11.5–15.5)
WBC: 10 10*3/uL (ref 4.0–10.5)
nRBC: 0 % (ref 0.0–0.2)

## 2021-01-15 LAB — COMPREHENSIVE METABOLIC PANEL
ALT: 8 U/L (ref 0–44)
AST: 17 U/L (ref 15–41)
Albumin: 3.3 g/dL — ABNORMAL LOW (ref 3.5–5.0)
Alkaline Phosphatase: 66 U/L (ref 38–126)
Anion gap: 7 (ref 5–15)
BUN: 16 mg/dL (ref 8–23)
CO2: 27 mmol/L (ref 22–32)
Calcium: 8.5 mg/dL — ABNORMAL LOW (ref 8.9–10.3)
Chloride: 100 mmol/L (ref 98–111)
Creatinine, Ser: 0.8 mg/dL (ref 0.44–1.00)
GFR, Estimated: >60 mL/min (ref >60)
Glucose, Bld: 120 mg/dL — ABNORMAL HIGH (ref 70–99)
Potassium: 4.1 mmol/L (ref 3.5–5.1)
Sodium: 134 mmol/L — ABNORMAL LOW (ref 135–145)
Total Bilirubin: 1.3 mg/dL — ABNORMAL HIGH (ref 0.3–1.2)
Total Protein: 7.8 g/dL (ref 6.5–8.1)

## 2021-01-15 LAB — RESP PANEL BY RT-PCR (FLU A&B, COVID) ARPGX2
Influenza A by PCR: NEGATIVE
Influenza B by PCR: NEGATIVE
SARS Coronavirus 2 by RT PCR: NEGATIVE

## 2021-01-15 MED ORDER — TORSEMIDE 20 MG PO TABS
10.0000 mg | ORAL_TABLET | Freq: Every day | ORAL | Status: DC
  Administered 2021-01-17 – 2021-01-20 (×4): 10 mg via ORAL
  Filled 2021-01-15 (×4): qty 1

## 2021-01-15 MED ORDER — VANCOMYCIN HCL 1250 MG/250ML IV SOLN
1250.0000 mg | INTRAVENOUS | Status: DC
  Administered 2021-01-16 – 2021-01-24 (×9): 1250 mg via INTRAVENOUS
  Filled 2021-01-15 (×11): qty 250

## 2021-01-15 MED ORDER — ONDANSETRON HCL 4 MG PO TABS: 4.0000 mg | ORAL_TABLET | Freq: Four times a day (QID) | ORAL | Status: DC | PRN

## 2021-01-15 MED ORDER — SODIUM CHLORIDE 0.9 % IV SOLN
2.0000 g | Freq: Three times a day (TID) | INTRAVENOUS | Status: DC
  Administered 2021-01-16 – 2021-01-17 (×4): 2 g via INTRAVENOUS
  Filled 2021-01-15 (×7): qty 2

## 2021-01-15 MED ORDER — POLYETHYLENE GLYCOL 3350 17 G PO PACK: 17.0000 g | PACK | Freq: Every day | ORAL | Status: DC

## 2021-01-15 MED ORDER — IOHEXOL 300 MG/ML  SOLN
100.0000 mL | Freq: Once | INTRAMUSCULAR | Status: AC | PRN
  Administered 2021-01-15: 100 mL via INTRAVENOUS

## 2021-01-15 MED ORDER — MIDODRINE HCL 5 MG PO TABS
2.5000 mg | ORAL_TABLET | Freq: Two times a day (BID) | ORAL | Status: DC
  Administered 2021-01-16 – 2021-01-17 (×3): 2.5 mg via ORAL
  Filled 2021-01-15 (×3): qty 1

## 2021-01-15 MED ORDER — PIPERACILLIN-TAZOBACTAM 3.375 G IVPB 30 MIN
3.3750 g | Freq: Once | INTRAVENOUS | Status: AC
  Administered 2021-01-15: 3.375 g via INTRAVENOUS
  Filled 2021-01-15: qty 50

## 2021-01-15 MED ORDER — FENTANYL CITRATE (PF) 100 MCG/2ML IJ SOLN
50.0000 ug | Freq: Once | INTRAMUSCULAR | Status: AC
  Administered 2021-01-15: 50 ug via INTRAVENOUS
  Filled 2021-01-15: qty 2

## 2021-01-15 MED ORDER — SENNOSIDES-DOCUSATE SODIUM 8.6-50 MG PO TABS: 1.0000 | ORAL_TABLET | Freq: Two times a day (BID) | ORAL | Status: DC

## 2021-01-15 MED ORDER — VANCOMYCIN HCL 1500 MG/300ML IV SOLN
1500.0000 mg | Freq: Once | INTRAVENOUS | Status: AC
  Administered 2021-01-16: 1500 mg via INTRAVENOUS
  Filled 2021-01-15: qty 300

## 2021-01-15 MED ORDER — SODIUM CHLORIDE 0.9 % IV SOLN: INTRAVENOUS | Status: DC

## 2021-01-15 MED ORDER — ACETAMINOPHEN 650 MG RE SUPP: 650.0000 mg | Freq: Four times a day (QID) | RECTAL | Status: DC | PRN

## 2021-01-15 MED ORDER — MAGNESIUM HYDROXIDE 400 MG/5ML PO SUSP: 30.0000 mL | Freq: Every day | ORAL | Status: DC | PRN

## 2021-01-15 MED ORDER — METHOCARBAMOL 500 MG PO TABS: 500.0000 mg | ORAL_TABLET | Freq: Four times a day (QID) | ORAL | Status: DC | PRN

## 2021-01-15 MED ORDER — ONDANSETRON HCL 4 MG/2ML IJ SOLN: 4.0000 mg | Freq: Four times a day (QID) | INTRAMUSCULAR | Status: DC | PRN

## 2021-01-15 MED ORDER — NITROGLYCERIN 0.4 MG SL SUBL
0.4000 mg | SUBLINGUAL_TABLET | SUBLINGUAL | Status: DC | PRN
  Administered 2021-01-29 (×2): 0.4 mg via SUBLINGUAL
  Filled 2021-01-15: qty 1

## 2021-01-15 MED ORDER — ROSUVASTATIN CALCIUM 10 MG PO TABS
10.0000 mg | ORAL_TABLET | Freq: Every day | ORAL | Status: DC
  Administered 2021-01-17 – 2021-01-25 (×8): 10 mg via ORAL
  Filled 2021-01-15 (×8): qty 1

## 2021-01-15 MED ORDER — ENSURE ENLIVE PO LIQD
237.0000 mL | Freq: Two times a day (BID) | ORAL | Status: DC
  Administered 2021-01-17 – 2021-01-22 (×8): 237 mL via ORAL

## 2021-01-15 MED ORDER — ACETAMINOPHEN 325 MG PO TABS: 650.0000 mg | ORAL_TABLET | Freq: Four times a day (QID) | ORAL | Status: DC | PRN

## 2021-01-15 MED ORDER — DOCUSATE SODIUM 100 MG PO CAPS: 100.0000 mg | ORAL_CAPSULE | Freq: Two times a day (BID) | ORAL | Status: DC

## 2021-01-15 MED ORDER — VANCOMYCIN HCL 1000 MG/200ML IV SOLN: 1000.0000 mg | Freq: Once | INTRAVENOUS | Status: DC

## 2021-01-15 MED ORDER — VANCOMYCIN HCL IN DEXTROSE 1-5 GM/200ML-% IV SOLN
1000.0000 mg | Freq: Once | INTRAVENOUS | Status: AC
  Administered 2021-01-15: 1000 mg via INTRAVENOUS
  Filled 2021-01-15: qty 200

## 2021-01-15 MED ORDER — TRAMADOL HCL 50 MG PO TABS
50.0000 mg | ORAL_TABLET | Freq: Four times a day (QID) | ORAL | Status: DC | PRN
  Administered 2021-01-16: 50 mg via ORAL
  Filled 2021-01-15 (×2): qty 1

## 2021-01-15 MED ORDER — SODIUM CHLORIDE 0.9 % IV BOLUS
1000.0000 mL | Freq: Once | INTRAVENOUS | Status: AC
  Administered 2021-01-15: 1000 mL via INTRAVENOUS

## 2021-01-15 MED ORDER — BISACODYL 5 MG PO TBEC: 5.0000 mg | DELAYED_RELEASE_TABLET | Freq: Every day | ORAL | Status: DC | PRN

## 2021-01-15 MED ORDER — METOCLOPRAMIDE HCL 10 MG PO TABS: 5.0000 mg | ORAL_TABLET | Freq: Three times a day (TID) | ORAL | Status: DC | PRN

## 2021-01-15 MED ORDER — ADULT MULTIVITAMIN W/MINERALS CH
1.0000 | ORAL_TABLET | Freq: Every day | ORAL | Status: DC
  Administered 2021-01-17 – 2021-02-10 (×23): 1 via ORAL
  Filled 2021-01-15 (×23): qty 1

## 2021-01-15 MED ORDER — TRAZODONE HCL 50 MG PO TABS: 25.0000 mg | ORAL_TABLET | Freq: Every evening | ORAL | Status: DC | PRN

## 2021-01-15 NOTE — ED Notes (Signed)
Orthopedic MD at bedside. 

## 2021-01-15 NOTE — Progress Notes (Signed)
Discussed patient with Dr. Rosita Kea. Patient over 3 months after L hip IM Nailing. Patient had been doing relatively well from a L hip standpoint until a few days ago with increased pain and new onset drainage from proximal wound. Current CT scan showing fluid collection and patient has signs/symptoms suggestive of localized soft tissue wound infection. Will plan for I&D of L hip region tomorrow afternoon. Full consult note to follow tomorrow. NPO after midnight.

## 2021-01-15 NOTE — ED Notes (Signed)
pts grandson Monica [name] updated at this time

## 2021-01-15 NOTE — ED Notes (Signed)
MSE not signed because EDP has already been in room and evaluated pt.

## 2021-01-15 NOTE — ED Notes (Signed)
Pt given meal tray and drink at this time 

## 2021-01-15 NOTE — Progress Notes (Signed)
Pharmacy Antibiotic Note  Monica [name] is a 87 y.o. female admitted on 01/15/2021 with cellulitis.  Pharmacy has been consulted for Vancomycin, Cefepime  dosing.  Plan: Cefepime 2 gm IV Q8H to start on 6/7 @ 0000.  Vancomycin 1 gm IV X 1 given in ED on 6/6 @ 1922. Additional Vancomycin 1500 mg IV X 1 ordered to make total loading dose of 2500 mg. Vancomycin 1250 mg IV Q24H ordered to start on 6/7 @ 1900.  AUC = 548 Vanc trough = 13.9   Height: 5\' 5"  (165.1 cm) Weight: 106.6 kg (235 lb) IBW/kg (Calculated) : 57  Temp (24hrs), Avg:98.7 F (37.1 C), Min:98.3 F (36.8 C), Max:99 F (37.2 C)  Recent Labs  Lab 01/15/21 1643  WBC 10.0  CREATININE 0.80    Estimated Creatinine Clearance: 62.3 mL/min (by C-G formula based on SCr of 0.8 mg/dL).    No Known Allergies  Antimicrobials this admission:   >>    >>   Dose adjustments this admission:   Microbiology results:  BCx:   UCx:    Sputum:    MRSA PCR:   Thank you for allowing pharmacy to be a part of this patient's care.  Aideen Fenster D 01/15/2021 11:19 PM

## 2021-01-15 NOTE — ED Notes (Signed)
EDP informed of BP with MAP slightly below 65.

## 2021-01-15 NOTE — H&P (Addendum)
[place]   PATIENT NAME: Monica [name]    MR#:  [id number]  DATE OF BIRTH:  03-Aug-1934  DATE OF ADMISSION:  01/15/2021  PRIMARY CARE PHYSICIAN: Patient, No Pcp Per (Inactive)   Patient is coming from: Home.   REQUESTING/REFERRING PHYSICIAN: Minna Antis, MD  CHIEF COMPLAINT:   Chief Complaint  Patient presents with  . Wound Check    HISTORY OF PRESENT ILLNESS:  Monica [name] is a 87 y.o. Caucasian female with medical history significant for coronary artery disease, aortic stenosis and dyslipidemia who underwent left hip intramedullary nailing over 3 months ago by Dr. Allena Katz and presents to the emergency room with acute onset of left hip pain and swelling, tenderness, pain, warmth and erythema please give 40 mEq milliequivalents p.o. potassium chloride and add on mag level.  If less than 2 give 2 g of IV magnesium sulfate with bloody drainage from her left hip wound.  She admitted to chills but denies any measured fever.  She has left lower leg swelling and erythema as well with tenderness and warmth.  No chest pain or palpitations.  No cough or wheezing.  No other bleeding diathesis.  She denies any nausea or vomiting or abdominal pain. ED Course: Upon presentation to the ER, temperature was 99 and blood pressure 106/55 with pulse 7100% on 3 L of O2 by nasal cannula.  Labs revealed mild hyponatremia and albumin of 3.3.  Influenza antigens and COVID-19 PCR came back negative.  Blood cultures were drawn.  Imaging: Left hip CT showed the following: 1. Status post ORIF of a comminuted intertrochanteric fracture of the left femur with displaced greater and lesser trochanteric fracture fragments, grossly similar alignment compared to intraoperative fluoroscopic images. No bridging bone formation across the fracture site. 2. Small fluid collection within the subcutaneous soft tissues, likely along a surgical incision site the level of the left iliac wing measuring  approximately 4.1 x 0.8 x 3.2 cm. Findings may represent a postoperative seroma or hematoma. Infected fluid collection would be difficult to exclude by imaging alone. 3. Partially visualized infrarenal abdominal aortic aneurysm with extensive mural thrombus measuring approximately 6.7 x 6.1 cm. Recommend referral to a vascular specialist. This recommendation follows ACR consensus guidelines: White Paper of the ACR Incidental Findings Committee II on Vascular Findings. J Am Coll Radiol 2013; 10:789-794. 4. Proximal right common iliac artery aneurysm measuring approximately 2.5 cm.  The patient was given 50 mcg of IV fentanyl, IV Zosyn and vancomycin and 1 L bolus of IV normal saline.  She will be admitted to a medical bed for further evaluation and management. PAST MEDICAL HISTORY:   Past Medical History:  Diagnosis Date  . Aortic stenosis    a. LHC 06/02/17: At least moderate aortic stenosis with a peak to peak gradient of 22 mmHg; b. TTE 05/2017: EF 55-60%, mild HK basal-midinferior wall, Gr1DD, mod to sev AS w/ mean gradient 21 mmHg, valve area 0.99, mild MR, mildly dilated LA   . CAD (coronary artery disease) 2018   a. inferior STEMI 06/02/2017: LHC 06/02/17: LM 20, D1 20%, o-pLCx 50, p-mRCA 100% s/p PCI/DES, mRCA 50, dRCA 30  . GI bleed    a. noted 06/05/2017  . HLD (hyperlipidemia)   . Obesity     PAST SURGICAL HISTORY:   Past Surgical History:  Procedure Laterality Date  . ANKLE RECONSTRUCTION  1956   also ORIF of right arm  . CORONARY STENT INTERVENTION N/A 06/01/2017   Procedure: Coronary/Graft  Acute MI Revascularization;  Surgeon: Iran OuchArida, Muhammad A, MD;  Location: [facility] INVASIVE CV LAB;  Service: Cardiovascular;  Laterality: N/A;  . INTRAMEDULLARY (IM) NAIL INTERTROCHANTERIC Left 10/03/2020   Procedure: INTRAMEDULLARY (IM) NAIL INTERTROCHANTRIC;  Surgeon: Signa KellPatel, Sunny, MD;  Location: [facility] ORS;  Service: Orthopedics;  Laterality: Left;  . LEFT HEART CATH AND CORONARY  ANGIOGRAPHY N/A 06/01/2017   Procedure: LEFT HEART CATH AND CORONARY ANGIOGRAPHY;  Surgeon: Iran OuchArida, Muhammad A, MD;  Location: [facility] INVASIVE CV LAB;  Service: Cardiovascular;  Laterality: N/A;    SOCIAL HISTORY:   Social History   Tobacco Use  . Smoking status: Former Smoker    Packs/day: 0.50    Years: 50.00    Pack years: 25.00    Types: Cigarettes    Quit date: 06/01/2017    Years since quitting: 3.6  . Smokeless tobacco: Never Used  Substance Use Topics  . Alcohol use: No    FAMILY HISTORY:   Family History  Problem Relation Age of Onset  . Valvular heart disease Mother   . Diabetes Mellitus II Daughter     DRUG ALLERGIES:  No Known Allergies  REVIEW OF SYSTEMS:   ROS As per history of present illness. All pertinent systems were reviewed above. Constitutional, HEENT, cardiovascular, respiratory, GI, GU, musculoskeletal, neuro, psychiatric, endocrine, integumentary and hematologic systems were reviewed and are otherwise negative/unremarkable except for positive findings mentioned above in the HPI.   MEDICATIONS AT HOME:   Prior to Admission medications   Medication Sig Start Date End Date Taking? Authorizing Provider  acetaminophen (TYLENOL) 325 MG tablet Take 2 tablets (650 mg total) by mouth every 6 (six) hours as needed for mild pain, fever or headache. 10/09/20 10/09/21  Esaw GrandchildGriffith, Kelly A, DO  aspirin 81 MG EC tablet Take 81 mg by mouth daily.    [provider]  bisacodyl (DULCOLAX) 5 MG EC tablet Take 1 tablet (5 mg total) by mouth daily as needed for moderate constipation. 10/09/20   Pennie BanterGriffith, Kelly A, DO  docusate sodium (COLACE) 100 MG capsule Take 1 capsule (100 mg total) by mouth 2 (two) times daily. 10/09/20   Esaw GrandchildGriffith, Kelly A, DO  enoxaparin (LOVENOX) 40 MG/0.4ML injection Inject 0.4 mLs (40 mg total) into the skin daily for 14 days. 10/04/20 10/18/20  Dedra SkeensMundy, Todd, PA-C  feeding supplement (ENSURE ENLIVE / ENSURE PLUS) LIQD Take 237 mLs by mouth 2  (two) times daily between meals. 10/09/20   Pennie BanterGriffith, Kelly A, DO  methocarbamol (ROBAXIN) 500 MG tablet Take 1 tablet (500 mg total) by mouth every 6 (six) hours as needed for muscle spasms. 10/09/20   Pennie BanterGriffith, Kelly A, DO  metoCLOPramide (REGLAN) 5 MG tablet Take 1-2 tablets (5-10 mg total) by mouth every 8 (eight) hours as needed for nausea (if ondansetron (ZOFRAN) ineffective.). 10/09/20   Pennie BanterGriffith, Kelly A, DO  midodrine (PROAMATINE) 2.5 MG tablet Take 1 tablet (2.5 mg total) by mouth 2 (two) times daily with a meal. 10/09/20   Pennie BanterGriffith, Kelly A, DO  Multiple Vitamin (MULTIVITAMIN WITH MINERALS) TABS tablet Take 1 tablet by mouth daily. 10/10/20   Pennie BanterGriffith, Kelly A, DO  nitroGLYCERIN (NITROSTAT) 0.4 MG SL tablet Place 1 tablet (0.4 mg total) under the tongue every 5 (five) minutes as needed for chest pain. 06/04/17   Shaune Pollackhen, Qing, MD  ondansetron (ZOFRAN) 4 MG tablet Take 1 tablet (4 mg total) by mouth every 6 (six) hours as needed for nausea. 10/09/20   Pennie BanterGriffith, Kelly A, DO  polyethylene glycol [facility](MIRALAX /  GLYCOLAX) 17 g packet Take 17 g by mouth daily. 10/10/20   Esaw Grandchild A, DO  rosuvastatin (CRESTOR) 10 MG tablet Take 1 tablet by mouth once daily Patient taking differently: Take 10 mg by mouth daily. 07/24/20   Iran Ouch, MD  senna-docusate (SENOKOT-S) 8.6-50 MG tablet Take 1 tablet by mouth 2 (two) times daily. 10/09/20   Pennie Banter, DO  torsemide (DEMADEX) 10 MG tablet Take 1 tablet (10 mg total) by mouth daily. 10/09/20   Pennie Banter, DO  traMADol (ULTRAM) 50 MG tablet Take 1 tablet (50 mg total) by mouth every 6 (six) hours as needed for moderate pain. 10/04/20   Dedra Skeens, PA-C      VITAL SIGNS:  Blood pressure (!) 129/92, pulse 93, temperature 98.3 F (36.8 C), resp. rate 20, height  (1.651 m), weight 106.6 kg, SpO2 95 %.  PHYSICAL EXAMINATION:  Physical Exam  GENERAL:  87 y.o.-year-old Caucasian female patient lying in the bed with no acute distress.   EYES: Pupils equal, round, reactive to light and accommodation. No scleral icterus. Extraocular muscles intact.  HEENT: Head atraumatic, normocephalic. Oropharynx and nasopharynx clear.  NECK:  Supple, no jugular venous distention. No thyroid enlargement, no tenderness.  LUNGS: Normal breath sounds bilaterally, no wheezing, rales,rhonchi or crepitation. No use of accessory muscles of respiration.  CARDIOVASCULAR: Regular rate and rhythm, S1, S2 normal.  2/6 systolic ejection murmur at the left lower sternal border with no rubs, or gallops.  ABDOMEN: Soft, nondistended, nontender. Bowel sounds present. No organomegaly or mass.  EXTREMITIES: No pedal edema, cyanosis, or clubbing.  NEUROLOGIC: Cranial nerves II through XII are intact. Muscle strength 5/5 in all extremities. Sensation intact. Gait not checked.  PSYCHIATRIC: The patient is alert and oriented x 3.  Normal affect and good eye contact. SKIN: Left lateral hip erythema  with significant induration, erythema, warmth and tenderness.   Left lower leg circular area of erythema with induration and lichenification, warmth and tenderness.      LABORATORY PANEL:   CBC Recent Labs  Lab 01/15/21 1643  WBC 10.0  HGB 12.6  HCT 41.5  PLT 316   ------------------------------------------------------------------------------------------------------------------  Chemistries  Recent Labs  Lab 01/15/21 1643  NA 134*  K 4.1  CL 100  CO2 27  GLUCOSE 120*  BUN 16  CREATININE 0.80  CALCIUM 8.5*  AST 17  ALT 8  ALKPHOS 66  BILITOT 1.3*   ------------------------------------------------------------------------------------------------------------------  Cardiac Enzymes No results for input(s): TROPONINI in the last 168 hours. ------------------------------------------------------------------------------------------------------------------  RADIOLOGY:  CT HIP LEFT W CONTRAST  Result Date: 01/15/2021 CLINICAL DATA:  Drainage at left  hip surgical site EXAM: CT OF THE LOWER LEFT EXTREMITY WITH CONTRAST TECHNIQUE: Multidetector CT imaging of the lower left extremity was performed according to the standard protocol following intravenous contrast administration. CONTRAST:  OMNIPAQUE IOHEXOL 300 MG/ML  SOLN COMPARISON:  Fluoroscopic images dated 10/03/2020 FINDINGS: Bones/Joint/Cartilage Status post ORIF of a comminuted intertrochanteric fracture of the left femur with antegrade long intramedullary rod and 2 proximal lag screws. No bridging bone formation across the fracture site. Displaced greater and lesser trochanteric fracture fragments, grossly similar alignment compared to intraoperative fluoroscopic images. No appreciable hip joint effusion. No evidence of acute fracture or dislocation. No evidence to suggest hardware loosening. Visualized portion of the left hemipelvis is intact. Ligaments Suboptimally assessed by CT. Muscles and Tendons No acute musculotendinous abnormality by CT. Soft tissues Skin thickening overlies the lateral aspect of the left hip.  Small fluid collection within the subcutaneous soft tissues, likely along a surgical incision site the level of the left iliac wing measuring approximately 4.1 x 0.8 x 3.2 cm (series 5, image 28). Mild subcutaneous edema throughout the lateral aspect of the left hip. Partially visualized infrarenal abdominal aortic aneurysm with extensive mural thrombus measuring approximately 6.7 x 6.1 cm (series 5, image 1). Proximal right common iliac artery aneurysm measuring approximately 2.5 cm. Extensive vascular atherosclerotic calcification. No left inguinal lymphadenopathy. IMPRESSION: 1. Status post ORIF of a comminuted intertrochanteric fracture of the left femur with displaced greater and lesser trochanteric fracture fragments, grossly similar alignment compared to intraoperative fluoroscopic images. No bridging bone formation across the fracture site. 2. Small fluid collection within the  subcutaneous soft tissues, likely along a surgical incision site the level of the left iliac wing measuring approximately 4.1 x 0.8 x 3.2 cm. Findings may represent a postoperative seroma or hematoma. Infected fluid collection would be difficult to exclude by imaging alone. 3. Partially visualized infrarenal abdominal aortic aneurysm with extensive mural thrombus measuring approximately 6.7 x 6.1 cm. Recommend referral to a vascular specialist. This recommendation follows ACR consensus guidelines: White Paper of the ACR Incidental Findings Committee II on Vascular Findings. J Am Coll Radiol 2013; 10:789-794. 4. Proximal right common iliac artery aneurysm measuring approximately 2.5 cm. Electronically Signed   By: Duanne Guess D.O.   On: 01/15/2021 18:45      IMPRESSION AND PLAN:  Active Problems:   Abscess of left hip  1.  Left hip severe cellulitis and likely abscess. - The patient will be admitted to a medical bed. - We will continue IV antibiotic therapy with IV vancomycin and cefepime. - Pain management will be provided. - Warm compresses will be utilized. - Orthopedic consultation will be obtained. - Dr. Rosita Kea was notified about the patient and notified Dr. Allena Katz who is aware about the patient. - The patient has cardiac disease and aortic stenosis.  She is considered above average risk for her age for perioperative cardiovascular for perioperative cardiovascular events.  The revised cardiac risk index.  She has no current pulmonary issues.  2.  Left lower extremity moderate cellulitis. - This should be covered with above-mentioned antibiotics.  3.  Infrarenal aortic artery aneurysm measuring 6.7 cm X6 0.1 cm with extensive mural thrombus. - The patient has been bleeding from her left hip wound and his aspirin will be held off for bleeding tendency. - At this time anticoagulation is contraindicated pending operative intervention.  It may later be considered when the patient is cleared  by our orthopedic surgeon. -Vascular surgery consult will be obtained. - I notified Dr. Gilda Crease about the patient.  4.  Dyslipidemia. - We will continue statin therapy.  5.  Coronary artery disease. - We are holding off aspirin for now.  The patient has no chest pain. - We will continue statin therapy as mentioned above.  DVT prophylaxis: SCDs pending possible operative intervention after which the patient can be placed on subcutaneous Lovenox.. Code Status: full code. Family Communication:  The plan of care was discussed in details with the patient (who requested no other family members to be notified at this time). I answered all questions. The patient agreed to proceed with the above mentioned plan. Further management will depend upon hospital course. Disposition Plan: Back to previous home environment Consults called: Orthopedic consultation as above. All the records are reviewed and case discussed with ED provider.  Status is: Inpatient  Remains inpatient  appropriate because:Ongoing active pain requiring inpatient pain management, Ongoing diagnostic testing needed not appropriate for outpatient work up, Unsafe d/c plan, IV treatments appropriate due to intensity of illness or inability to take PO and Inpatient level of care appropriate due to severity of illness   Dispo: The patient is from: Home              Anticipated d/c is to: Home              Patient currently is not medically stable to d/c.   Difficult to place patient No  TOTAL TIME TAKING CARE OF THIS PATIENT: 50 minutes.    Hannah Beat M.D on 01/15/2021 at 11:10 PM  [facility]   From 7 PM-7 AM, contact night-coverage [email or web address]  CC: Primary care physician; Patient, No Pcp Per (Inactive)

## 2021-01-15 NOTE — ED Triage Notes (Signed)
Pt to ED from home [facility] Pt has incision to L hip from surgery 2/22 (3 incisions for hip/femur surgery). One of these incisions on L hip appears red and pt states that yesterday it "burst open" and had sangiunous drainage. Pt entire L leg appears red and swollen, including ankle.  EMS VS: 83% SPO2 on RA, 92% on 3L oxygen 111/34 BP Pulse 84 T 99.0 oral  EMS states pt did not meet their sepsis criteria. Pt found here to be 87% SPO2 on RA. Placed on 2L oxygen, SPO2 then 91%. Placed on 3L, SPO2 now 100%. Pt denies COPD or pulmonary disease. Denies diabetes mellitis.  VS here upon arrival: 100% on 3L oxygen per Mona 106/55 (MAP of 66) HR 90 T 99.0 oral

## 2021-01-15 NOTE — ED Provider Notes (Addendum)
[facility] Emergency Department Provider Note  Time seen: 4:40 PM  I have reviewed the triage vital signs and the nursing notes.   HISTORY  Chief Complaint Wound Check   HPI Monica [name] is a 87 y.o. female with a past medical history of CAD, hyperlipidemia, obesity, left hip replacement in February of this year presents to the emergency department for drainage from her left hip.  According to the patient yesterday the incision/scar of her left hip opened up and began draining.  Patient has noticed redness swelling and tenderness around the incision as well.  Patient states Dr. Allena Katz did a left hip replacement in February of this year.  Patient denies any fever at home, 99 degrees in the emergency department.  Largely negative review of systems otherwise.  Does state moderate pain to the left hip as well.   Past Medical History:  Diagnosis Date  . Aortic stenosis    a. LHC 06/02/17: At least moderate aortic stenosis with a peak to peak gradient of 22 mmHg; b. TTE 05/2017: EF 55-60%, mild HK basal-midinferior wall, Gr1DD, mod to sev AS w/ mean gradient 21 mmHg, valve area 0.99, mild MR, mildly dilated LA   . CAD (coronary artery disease) 2018   a. inferior STEMI 06/02/2017: LHC 06/02/17: LM 20, D1 20%, o-pLCx 50, p-mRCA 100% s/p PCI/DES, mRCA 50, dRCA 30  . GI bleed    a. noted 06/05/2017  . HLD (hyperlipidemia)   . Obesity     Patient Active Problem List   Diagnosis Date Noted  . Acute respiratory failure with hypoxia (HCC) 10/05/2020  . Severe aortic stenosis   . Closed comminuted intertrochanteric fracture of proximal end of left femur (HCC) 10/02/2020  . Aortic stenosis   . Obesity   . Cellulitis of left lower extremity 04/29/2018  . GI bleed 06/05/2017  . Nausea vomiting and diarrhea   . Acute ST elevation myocardial infarction (STEMI) of inferior wall (HCC) 06/02/2017  . CAD (coronary artery disease) 2018    Past Surgical History:  Procedure  Laterality Date  . ANKLE RECONSTRUCTION  1956   also ORIF of right arm  . CORONARY STENT INTERVENTION N/A 06/01/2017   Procedure: Coronary/Graft Acute MI Revascularization;  Surgeon: Iran Ouch, MD;  Location: [facility] INVASIVE CV LAB;  Service: Cardiovascular;  Laterality: N/A;  . INTRAMEDULLARY (IM) NAIL INTERTROCHANTERIC Left 10/03/2020   Procedure: INTRAMEDULLARY (IM) NAIL INTERTROCHANTRIC;  Surgeon: Signa Kell, MD;  Location: [facility] ORS;  Service: Orthopedics;  Laterality: Left;  . LEFT HEART CATH AND CORONARY ANGIOGRAPHY N/A 06/01/2017   Procedure: LEFT HEART CATH AND CORONARY ANGIOGRAPHY;  Surgeon: Iran Ouch, MD;  Location: [facility] INVASIVE CV LAB;  Service: Cardiovascular;  Laterality: N/A;    Prior to Admission medications   Medication Sig Start Date End Date Taking? Authorizing Provider  acetaminophen (TYLENOL) 325 MG tablet Take 2 tablets (650 mg total) by mouth every 6 (six) hours as needed for mild pain, fever or headache. 10/09/20 10/09/21  Esaw Grandchild A, DO  aspirin 81 MG EC tablet Take 81 mg by mouth daily.    [provider]  bisacodyl (DULCOLAX) 5 MG EC tablet Take 1 tablet (5 mg total) by mouth daily as needed for moderate constipation. 10/09/20   Pennie Banter, DO  docusate sodium (COLACE) 100 MG capsule Take 1 capsule (100 mg total) by mouth 2 (two) times daily. 10/09/20   Esaw Grandchild A, DO  enoxaparin (LOVENOX) 40 MG/0.4ML injection Inject 0.4 mLs (  40 mg total) into the skin daily for 14 days. 10/04/20 10/18/20  Dedra Skeens, PA-C  feeding supplement (ENSURE ENLIVE / ENSURE PLUS) LIQD Take 237 mLs by mouth 2 (two) times daily between meals. 10/09/20   Pennie Banter, DO  methocarbamol (ROBAXIN) 500 MG tablet Take 1 tablet (500 mg total) by mouth every 6 (six) hours as needed for muscle spasms. 10/09/20   Pennie Banter, DO  metoCLOPramide (REGLAN) 5 MG tablet Take 1-2 tablets (5-10 mg total) by mouth every 8 (eight) hours as needed for nausea (if  ondansetron (ZOFRAN) ineffective.). 10/09/20   Pennie Banter, DO  midodrine (PROAMATINE) 2.5 MG tablet Take 1 tablet (2.5 mg total) by mouth 2 (two) times daily with a meal. 10/09/20   Pennie Banter, DO  Multiple Vitamin (MULTIVITAMIN WITH MINERALS) TABS tablet Take 1 tablet by mouth daily. 10/10/20   Pennie Banter, DO  nitroGLYCERIN (NITROSTAT) 0.4 MG SL tablet Place 1 tablet (0.4 mg total) under the tongue every 5 (five) minutes as needed for chest pain. 06/04/17   Shaune Pollack, MD  ondansetron (ZOFRAN) 4 MG tablet Take 1 tablet (4 mg total) by mouth every 6 (six) hours as needed for nausea. 10/09/20   Pennie Banter, DO  polyethylene glycol (MIRALAX / GLYCOLAX) 17 g packet Take 17 g by mouth daily. 10/10/20   Esaw Grandchild A, DO  rosuvastatin (CRESTOR) 10 MG tablet Take 1 tablet by mouth once daily Patient taking differently: Take 10 mg by mouth daily. 07/24/20   Iran Ouch, MD  senna-docusate (SENOKOT-S) 8.6-50 MG tablet Take 1 tablet by mouth 2 (two) times daily. 10/09/20   Pennie Banter, DO  torsemide (DEMADEX) 10 MG tablet Take 1 tablet (10 mg total) by mouth daily. 10/09/20   Pennie Banter, DO  traMADol (ULTRAM) 50 MG tablet Take 1 tablet (50 mg total) by mouth every 6 (six) hours as needed for moderate pain. 10/04/20   Dedra Skeens, PA-C    No Known Allergies  Family History  Problem Relation Age of Onset  . Valvular heart disease Mother   . Diabetes Mellitus II Daughter     Social History Social History   Tobacco Use  . Smoking status: Former Smoker    Packs/day: 0.50    Years: 50.00    Pack years: 25.00    Types: Cigarettes    Quit date: 06/01/2017    Years since quitting: 3.6  . Smokeless tobacco: Never Used  Vaping Use  . Vaping Use: Never used  Substance Use Topics  . Alcohol use: No  . Drug use: No    Review of Systems Constitutional: Negative for fever. Cardiovascular: Negative for chest pain. Respiratory: Negative for shortness of  breath. Gastrointestinal: Negative for abdominal pain Musculoskeletal: Left hip discomfort and drainage Skin: Drainage from left hip incision Neurological: Negative for headache All other ROS negative  ____________________________________________   PHYSICAL EXAM:  VITAL SIGNS: ED Triage Vitals  Enc Vitals Group     BP 01/15/21 1634 (!) 106/55     Pulse Rate 01/15/21 1634 91     Resp 01/15/21 1634 16     Temp 01/15/21 1634 99 F (37.2 C)     Temp Source 01/15/21 1634 Oral     SpO2 01/15/21 1634 100 %     Weight 01/15/21 1638 235 lb (106.6 kg)     Height 01/15/21 1638 5\' 5"  (1.651 m)     Head Circumference --  Peak Flow --      Pain Score 01/15/21 1635 7     Pain Loc --      Pain Edu? --      Excl. in GC? --    Constitutional: Alert and oriented. Well appearing and in no distress. Eyes: Normal exam ENT      Head: Normocephalic and atraumatic.      Mouth/Throat: Mucous membranes are moist. Cardiovascular: Normal rate, regular rhythm.  Respiratory: Normal respiratory effort without tachypnea nor retractions. Breath sounds are clear Gastrointestinal: Soft and nontender. No distention.   Musculoskeletal: Moderate tenderness to left hip, pain across the entire leg with range of motion. Neurologic:  Normal speech and language. No gross focal neurologic deficits  Skin: Patient has an area of drainage approximately 1.5 cm.  Draining serosanguineous type fluid however around this area there is an area of induration approximately 10 to 15 cm in diameter that is tender mildly erythematous. Psychiatric: Mood and affect are normal  ____________________________________________   RADIOLOGY  CT scan shows a fluid collection in the subcu fluid along the postsurgical site measuring 4 x 1 x 3.2 cm.  ____________________________________________   INITIAL IMPRESSION / ASSESSMENT AND PLAN / ED COURSE  Pertinent labs & imaging results that were available during my care of the  patient were reviewed by me and considered in my medical decision making (see chart for details).   Patient presents emergency department for left hip pain with drainage from an indurated area at from prior hip surgery incision.  Highly suspect infection to this area unclear if it extends to the joint or if it is more superficial.  We will check labs and proceed with CT imaging with contrast of the left hip to further evaluate.  Patient agreeable.  Patient's labs are largely nonrevealing.  Patient continues to be hypotensive.  Receiving IV fluids.  Low-grade temperature in the emergency department.  Clinical exam consistent with cellulitis with drainage possible abscess.  CT scan confirms fluid collection.  I spoke to Dr. Rosita Kea who is on-call for Dr. Allena Katz who wanted the patient admitted for IV antibiotics and they will see the patient tomorrow for further evaluation and possible drainage.  CT also shows a 6.7 cm partially visualized infrarenal aortic aneurysm.  Spoke with Dr. Gilda Crease of vascular surgery.  No further emergent imaging needed at this time.  He will follow along with the patient.  Patient will be admitted to the hospital service.  Monica [name] was evaluated in Emergency Department on 01/15/2021 for the symptoms described in the history of present illness. She was evaluated in the context of the global COVID-19 pandemic, which necessitated consideration that the patient might be at risk for infection with the SARS-CoV-2 virus that causes COVID-19. Institutional protocols and algorithms that pertain to the evaluation of patients at risk for COVID-19 are in a state of rapid change based on information released by regulatory bodies including the CDC and federal and state organizations. These policies and algorithms were followed during the patient's care in the ED.  ____________________________________________   FINAL CLINICAL IMPRESSION(S) / ED DIAGNOSES  Left hip pain Left hip infection    Minna Antis, MD 01/15/21 Caralyn Guile, MD 01/15/21 2013

## 2021-01-16 ENCOUNTER — Inpatient Hospital Stay: Payer: Medicare Other | Admitting: Anesthesiology

## 2021-01-16 ENCOUNTER — Encounter
Admission: EM | Disposition: A | Discharge status: Skilled Nursing Facility | Payer: Self-pay | Source: Home / Self Care | Attending: Internal Medicine

## 2021-01-16 DIAGNOSIS — L02416 Cutaneous abscess of left lower limb: Secondary | ICD-10-CM

## 2021-01-16 DIAGNOSIS — T8149XA Infection following a procedure, other surgical site, initial encounter: Secondary | ICD-10-CM

## 2021-01-16 DIAGNOSIS — L03116 Cellulitis of left lower limb: Secondary | ICD-10-CM

## 2021-01-16 DIAGNOSIS — I714 Abdominal aortic aneurysm, without rupture, unspecified: Secondary | ICD-10-CM

## 2021-01-16 HISTORY — PX: INCISION AND DRAINAGE HIP: SHX1801

## 2021-01-16 LAB — BLOOD CULTURE ID PANEL (REFLEXED) - BCID2

## 2021-01-16 LAB — BASIC METABOLIC PANEL
Anion gap: 6 (ref 5–15)
BUN: 14 mg/dL (ref 8–23)
CO2: 25 mmol/L (ref 22–32)
Calcium: 8.2 mg/dL — ABNORMAL LOW (ref 8.9–10.3)
Chloride: 102 mmol/L (ref 98–111)
Creatinine, Ser: 0.74 mg/dL (ref 0.44–1.00)
GFR, Estimated: >60 mL/min (ref >60)
Glucose, Bld: 109 mg/dL — ABNORMAL HIGH (ref 70–99)
Potassium: 4.3 mmol/L (ref 3.5–5.1)
Sodium: 133 mmol/L — ABNORMAL LOW (ref 135–145)

## 2021-01-16 LAB — CBC
HCT: 36.4 % (ref 36.0–46.0)
Hemoglobin: 11.2 g/dL — ABNORMAL LOW (ref 12.0–15.0)
MCH: 24.2 pg — ABNORMAL LOW (ref 26.0–34.0)
MCHC: 30.8 g/dL (ref 30.0–36.0)
MCV: 78.6 fL — ABNORMAL LOW (ref 80.0–100.0)
Platelets: 269 10*3/uL (ref 150–400)
RBC: 4.63 MIL/uL (ref 3.87–5.11)
RDW: 18.3 % — ABNORMAL HIGH (ref 11.5–15.5)
WBC: 8.8 10*3/uL (ref 4.0–10.5)
nRBC: 0 % (ref 0.0–0.2)

## 2021-01-16 LAB — C-REACTIVE PROTEIN: CRP: 6.6 mg/dL — ABNORMAL HIGH (ref <1.0)

## 2021-01-16 SURGERY — IRRIGATION AND DEBRIDEMENT HIP
Anesthesia: General | Site: Hip | Laterality: Left

## 2021-01-16 MED ORDER — KETOROLAC TROMETHAMINE 15 MG/ML IJ SOLN
7.5000 mg | Freq: Four times a day (QID) | INTRAMUSCULAR | Status: AC
  Administered 2021-01-16 – 2021-01-17 (×4): 7.5 mg via INTRAVENOUS
  Filled 2021-01-16 (×4): qty 1

## 2021-01-16 MED ORDER — METHOCARBAMOL 500 MG PO TABS
500.0000 mg | ORAL_TABLET | Freq: Four times a day (QID) | ORAL | Status: DC | PRN
  Administered 2021-01-18 – 2021-01-19 (×2): 500 mg via ORAL
  Filled 2021-01-16 (×3): qty 1

## 2021-01-16 MED ORDER — OXYCODONE HCL 5 MG PO TABS
ORAL_TABLET | ORAL | Status: AC
  Filled 2021-01-16: qty 1

## 2021-01-16 MED ORDER — HYDROMORPHONE HCL 1 MG/ML IJ SOLN: 0.2000 mg | INTRAMUSCULAR | Status: DC | PRN

## 2021-01-16 MED ORDER — METHOCARBAMOL 1000 MG/10ML IJ SOLN
500.0000 mg | Freq: Four times a day (QID) | INTRAVENOUS | Status: DC | PRN
  Filled 2021-01-16: qty 5

## 2021-01-16 MED ORDER — ONDANSETRON HCL 4 MG PO TABS
4.0000 mg | ORAL_TABLET | Freq: Four times a day (QID) | ORAL | Status: DC | PRN
  Administered 2021-01-17: 4 mg via ORAL
  Filled 2021-01-16 (×2): qty 1

## 2021-01-16 MED ORDER — ONDANSETRON HCL 4 MG/2ML IJ SOLN
INTRAMUSCULAR | Status: DC | PRN
  Administered 2021-01-16: 4 mg via INTRAVENOUS

## 2021-01-16 MED ORDER — DOCUSATE SODIUM 100 MG PO CAPS
100.0000 mg | ORAL_CAPSULE | Freq: Two times a day (BID) | ORAL | Status: DC
  Administered 2021-01-16 – 2021-01-23 (×15): 100 mg via ORAL
  Filled 2021-01-16 (×14): qty 1

## 2021-01-16 MED ORDER — MEPERIDINE HCL 25 MG/ML IJ SOLN: 6.2500 mg | INTRAMUSCULAR | Status: DC | PRN

## 2021-01-16 MED ORDER — ONDANSETRON HCL 4 MG/2ML IJ SOLN
4.0000 mg | Freq: Four times a day (QID) | INTRAMUSCULAR | Status: DC | PRN
  Administered 2021-01-18 – 2021-01-26 (×6): 4 mg via INTRAVENOUS
  Filled 2021-01-16 (×6): qty 2

## 2021-01-16 MED ORDER — SODIUM CHLORIDE 0.9 % IV SOLN
INTRAVENOUS | Status: DC | PRN
  Administered 2021-01-16: 50 ug/min via INTRAVENOUS

## 2021-01-16 MED ORDER — FENTANYL CITRATE (PF) 100 MCG/2ML IJ SOLN: 25.0000 ug | INTRAMUSCULAR | Status: DC | PRN

## 2021-01-16 MED ORDER — ENOXAPARIN SODIUM 40 MG/0.4ML IJ SOSY
40.0000 mg | PREFILLED_SYRINGE | INTRAMUSCULAR | Status: DC
  Administered 2021-01-17 – 2021-01-29 (×12): 40 mg via SUBCUTANEOUS
  Filled 2021-01-16 (×12): qty 0.4

## 2021-01-16 MED ORDER — SUGAMMADEX SODIUM 200 MG/2ML IV SOLN
INTRAVENOUS | Status: DC | PRN
  Administered 2021-01-16: 200 mg via INTRAVENOUS

## 2021-01-16 MED ORDER — BISACODYL 10 MG RE SUPP
10.0000 mg | Freq: Every day | RECTAL | Status: DC | PRN
  Administered 2021-01-29 – 2021-01-30 (×2): 10 mg via RECTAL
  Filled 2021-01-16 (×2): qty 1

## 2021-01-16 MED ORDER — IPRATROPIUM-ALBUTEROL 0.5-2.5 (3) MG/3ML IN SOLN
3.0000 mL | Freq: Once | RESPIRATORY_TRACT | Status: AC
  Administered 2021-01-16: 3 mL via RESPIRATORY_TRACT

## 2021-01-16 MED ORDER — OXYCODONE HCL 5 MG PO TABS
5.0000 mg | ORAL_TABLET | Freq: Once | ORAL | Status: AC | PRN
  Administered 2021-01-16: 5 mg via ORAL

## 2021-01-16 MED ORDER — OXYCODONE HCL 5 MG PO TABS
10.0000 mg | ORAL_TABLET | ORAL | Status: DC | PRN
  Administered 2021-01-18: 15 mg via ORAL
  Administered 2021-01-18 – 2021-01-20 (×4): 10 mg via ORAL
  Filled 2021-01-16: qty 3

## 2021-01-16 MED ORDER — OXYCODONE HCL 5 MG/5ML PO SOLN
5.0000 mg | Freq: Once | ORAL | Status: AC | PRN
Start: 2021-01-16 — End: 2021-01-16

## 2021-01-16 MED ORDER — OXYCODONE HCL 5 MG PO TABS
5.0000 mg | ORAL_TABLET | ORAL | Status: DC | PRN
  Administered 2021-01-17 – 2021-01-19 (×2): 10 mg via ORAL
  Administered 2021-01-21 – 2021-01-22 (×3): 5 mg via ORAL
  Filled 2021-01-16: qty 2
  Filled 2021-01-16: qty 1
  Filled 2021-01-16 (×4): qty 2
  Filled 2021-01-16 (×2): qty 1
  Filled 2021-01-16: qty 2
  Filled 2021-01-16: qty 1

## 2021-01-16 MED ORDER — SENNOSIDES-DOCUSATE SODIUM 8.6-50 MG PO TABS: 1.0000 | ORAL_TABLET | Freq: Every evening | ORAL | Status: DC | PRN

## 2021-01-16 MED ORDER — ROCURONIUM BROMIDE 100 MG/10ML IV SOLN
INTRAVENOUS | Status: DC | PRN
  Administered 2021-01-16: 20 mg via INTRAVENOUS

## 2021-01-16 MED ORDER — FLEET ENEMA 7-19 GM/118ML RE ENEM: 1.0000 | ENEMA | Freq: Once | RECTAL | Status: DC | PRN

## 2021-01-16 MED ORDER — PHENYLEPHRINE HCL (PRESSORS) 10 MG/ML IV SOLN
INTRAVENOUS | Status: DC | PRN
  Administered 2021-01-16 (×4): 100 ug via INTRAVENOUS

## 2021-01-16 MED ORDER — PROMETHAZINE HCL 25 MG/ML IJ SOLN: 6.2500 mg | INTRAMUSCULAR | Status: DC | PRN

## 2021-01-16 MED ORDER — ACETAMINOPHEN 500 MG PO TABS
1000.0000 mg | ORAL_TABLET | Freq: Four times a day (QID) | ORAL | Status: DC
  Administered 2021-01-16 – 2021-01-17 (×3): 1000 mg via ORAL
  Filled 2021-01-16 (×3): qty 2

## 2021-01-16 MED ORDER — SUCCINYLCHOLINE CHLORIDE 20 MG/ML IJ SOLN
INTRAMUSCULAR | Status: DC | PRN
  Administered 2021-01-16: 100 mg via INTRAVENOUS

## 2021-01-16 MED ORDER — IPRATROPIUM-ALBUTEROL 0.5-2.5 (3) MG/3ML IN SOLN
RESPIRATORY_TRACT | Status: AC
  Filled 2021-01-16: qty 3

## 2021-01-16 MED ORDER — METOCLOPRAMIDE HCL 10 MG PO TABS
5.0000 mg | ORAL_TABLET | Freq: Three times a day (TID) | ORAL | Status: DC | PRN
  Administered 2021-01-23: 10 mg via ORAL
  Filled 2021-01-16 (×2): qty 1

## 2021-01-16 MED ORDER — SODIUM CHLORIDE 0.9 % IV SOLN: INTRAVENOUS | Status: DC

## 2021-01-16 MED ORDER — LIDOCAINE HCL (CARDIAC) PF 100 MG/5ML IV SOSY
PREFILLED_SYRINGE | INTRAVENOUS | Status: DC | PRN
  Administered 2021-01-16: 100 mg via INTRAVENOUS

## 2021-01-16 MED ORDER — NEOMYCIN-POLYMYXIN B GU 40-200000 IR SOLN
Status: DC | PRN
  Administered 2021-01-16: 4 mL

## 2021-01-16 MED ORDER — METOCLOPRAMIDE HCL 5 MG/ML IJ SOLN
5.0000 mg | Freq: Three times a day (TID) | INTRAMUSCULAR | Status: DC | PRN
Start: 2021-01-16 — End: 2021-02-07
  Administered 2021-01-28: 10 mg via INTRAVENOUS
  Filled 2021-01-16: qty 2

## 2021-01-16 MED ORDER — LACTATED RINGERS IV SOLN: INTRAVENOUS | Status: DC | PRN

## 2021-01-16 MED ORDER — PROPOFOL 10 MG/ML IV BOLUS
INTRAVENOUS | Status: DC | PRN
  Administered 2021-01-16: 100 mg via INTRAVENOUS

## 2021-01-16 MED ORDER — FENTANYL CITRATE (PF) 100 MCG/2ML IJ SOLN
INTRAMUSCULAR | Status: DC | PRN
  Administered 2021-01-16 (×2): 25 ug via INTRAVENOUS
  Administered 2021-01-16: 50 ug via INTRAVENOUS

## 2021-01-16 MED ORDER — TRAMADOL HCL 50 MG PO TABS
50.0000 mg | ORAL_TABLET | Freq: Four times a day (QID) | ORAL | Status: DC | PRN
  Administered 2021-01-19 – 2021-01-21 (×2): 50 mg via ORAL
  Filled 2021-01-16 (×2): qty 1

## 2021-01-16 SURGICAL SUPPLY — 53 items
DERMABOND ADVANCED (GAUZE/BANDAGES/DRESSINGS)
DERMABOND ADVANCED .7 DNX12 (GAUZE/BANDAGES/DRESSINGS) ×1 IMPLANT
DRAPE 3/4 80X56 (DRAPES) ×3 IMPLANT
DRAPE INCISE IOBAN 66X60 STRL (DRAPES) ×1 IMPLANT
DRAPE SURG 17X11 SM STRL (DRAPES) ×1 IMPLANT
DRAPE U-SHAPE 47X51 STRL (DRAPES) ×6 IMPLANT
DRSG OPSITE POSTOP 3X4 (GAUZE/BANDAGES/DRESSINGS) IMPLANT
DRSG OPSITE POSTOP 4X6 (GAUZE/BANDAGES/DRESSINGS) IMPLANT
DRSG OPSITE POSTOP 4X8 (GAUZE/BANDAGES/DRESSINGS) IMPLANT
DRSG TELFA 4X3 1S NADH ST (GAUZE/BANDAGES/DRESSINGS) ×2 IMPLANT
ELECT CAUTERY BLADE 6.4 (BLADE) ×3 IMPLANT
ELECT REM PT RETURN 9FT ADLT (ELECTROSURGICAL) ×3
ELECTRODE REM PT RTRN 9FT ADLT (ELECTROSURGICAL) ×1 IMPLANT
GAUZE SPONGE 4X4 12PLY STRL (GAUZE/BANDAGES/DRESSINGS) ×3 IMPLANT
GAUZE XEROFORM 1X8 LF (GAUZE/BANDAGES/DRESSINGS) ×3 IMPLANT
GLOVE SRG 8 PF TXTR STRL LF DI (GLOVE) ×1 IMPLANT
GLOVE SURG ORTHO LTX SZ8 (GLOVE) ×1 IMPLANT
GLOVE SURG SYN 7.5  E (GLOVE) ×16
GLOVE SURG SYN 7.5 E (GLOVE) ×8 IMPLANT
GLOVE SURG SYN 7.5 PF PI (GLOVE) ×1 IMPLANT
GLOVE SURG UNDER POLY LF SZ8 (GLOVE)
GOWN STRL REUS W/ TWL LRG LVL3 (GOWN DISPOSABLE) ×2 IMPLANT
GOWN STRL REUS W/ TWL XL LVL3 (GOWN DISPOSABLE) ×1 IMPLANT
GOWN STRL REUS W/TWL LRG LVL3 (GOWN DISPOSABLE) ×4
GOWN STRL REUS W/TWL XL LVL3 (GOWN DISPOSABLE) ×2
KIT PREVENA INCISION MGT 13 (CANNISTER) ×2 IMPLANT
KIT TURNOVER KIT A (KITS) ×3 IMPLANT
MANIFOLD NEPTUNE II (INSTRUMENTS) ×4 IMPLANT
MAT ABSORB  FLUID 56X50 GRAY (MISCELLANEOUS) ×2
MAT ABSORB FLUID 56X50 GRAY (MISCELLANEOUS) ×1 IMPLANT
NDL FILTER BLUNT 18X1 1/2 (NEEDLE) ×1 IMPLANT
NDL SAFETY ECLIPSE 18X1.5 (NEEDLE) ×1 IMPLANT
NEEDLE FILTER BLUNT 18X 1/2SAF (NEEDLE) ×2
NEEDLE FILTER BLUNT 18X1 1/2 (NEEDLE) ×1 IMPLANT
NEEDLE HYPO 18GX1.5 SHARP (NEEDLE) ×2
NEEDLE HYPO 22GX1.5 SAFETY (NEEDLE) ×3 IMPLANT
NS IRRIG 1000ML POUR BTL (IV SOLUTION) ×1 IMPLANT
PACK HIP PROSTHESIS (MISCELLANEOUS) ×3 IMPLANT
PENCIL SMOKE EVACUATOR (MISCELLANEOUS) ×3 IMPLANT
PULSAVAC PLUS IRRIG FAN TIP (DISPOSABLE) ×3
SOL .9 NS 3000ML IRR  AL (IV SOLUTION) ×2
SOL .9 NS 3000ML IRR UROMATIC (IV SOLUTION) IMPLANT
STAPLER SKIN PROX 35W (STAPLE) IMPLANT
SUT MNCRL AB 4-0 PS2 18 (SUTURE) ×3 IMPLANT
SUT PDS AB 0 CT1 27 (SUTURE) ×2 IMPLANT
SUT PROLENE 3 0 CT 1 (SUTURE) ×2 IMPLANT
SUT VIC AB 0 CT1 36 (SUTURE) ×3 IMPLANT
SUT VIC AB 2-0 CT1 27 (SUTURE) ×2
SUT VIC AB 2-0 CT1 TAPERPNT 27 (SUTURE) ×2 IMPLANT
SYR 10ML LL (SYRINGE) ×3 IMPLANT
SYR 30ML LL (SYRINGE) ×3 IMPLANT
TAPE MICROFOAM 4IN (TAPE) ×3 IMPLANT
TIP FAN IRRIG PULSAVAC PLUS (DISPOSABLE) IMPLANT

## 2021-01-16 NOTE — H&P (Signed)
H&P reviewed. No significant changes noted.  

## 2021-01-16 NOTE — Progress Notes (Signed)
Vascular on floor. Surgery on floor says anesthesia wont take patient until ct redone. Ct cant be redone until tomorrow due to contrast given yesterday. 1315 surgery called again and they will take patient since Aneurysm cant be evaluated until tomorrow.

## 2021-01-16 NOTE — Consult Note (Signed)
[facility] Vascular Consult Note  MRN : [id number]  Monica [name] is a 87 y.o. ([birth year]) female who presents with chief complaint of  Chief Complaint  Patient presents with  . Wound Check   History of Present Illness:  Monica [name] is a 87 year old female with medical history significant for coronary artery disease, aortic stenosis and dyslipidemia who underwent left hip intramedullary nailing over 3 months ago by Dr. Allena Katz who presents to the emergency room with acute onset of left hip pain and swelling, tenderness, and pain.  She admitted to chills but denies any measured fever. She has left lower leg swelling and erythema as well with tenderness and warmth. No chest pain or palpitations. No cough or wheezing. No other bleeding diathesis. She denies any nausea or vomiting or abdominal pain.  ED Course:  Upon presentation to the ER, temperature was 99 and blood pressure 106/55 with pulse 7100% on 3 L of O2 by nasal cannula. Labs revealed mild hyponatremia and albumin of 3.3.  Influenza antigens and COVID-19 PCR came back negative.    CT Left Hip With Contrast (01/16/21):  "Partially visualized infrarenal abdominal aortic aneurysm with extensive mural thrombus measuring approximately 6.7 x 6.1 cm".  Vascular surgery was consulted by Dr. Allena Katz for possible infrarenal aortic aneurysm.  Current Facility-Administered Medications  Medication Dose Route Frequency Provider Last Rate Last Admin  . 0.9 %  sodium chloride infusion   Intravenous Continuous Mansy, Jan A, MD 100 mL/hr at 01/16/21 1349 Continued from Pre-op at 01/16/21 1349  . [MAR Hold] acetaminophen (TYLENOL) tablet 650 mg  650 mg Oral Q6H PRN Mansy, Vernetta Honey, MD       Or  . Mitzi Hansen Hold] acetaminophen (TYLENOL) suppository 650 mg  650 mg Rectal Q6H PRN Mansy, Vernetta Honey, MD      . Mitzi Hansen Hold] bisacodyl (DULCOLAX) EC tablet 5 mg  5 mg Oral Daily PRN Mansy, Jan A, MD      . Mitzi Hansen Hold] ceFEPIme (MAXIPIME) 2 g in  sodium chloride 0.9 % 100 mL IVPB  2 g Intravenous Q8H Mansy, Jan A, MD 200 mL/hr at 01/16/21 0911 2 g at 01/16/21 0911  . [MAR Hold] docusate sodium (COLACE) capsule 100 mg  100 mg Oral BID Mansy, Vernetta Honey, MD      . Mitzi Hansen Hold] feeding supplement (ENSURE ENLIVE / ENSURE PLUS) liquid 237 mL  237 mL Oral BID BM Mansy, Vernetta Honey, MD      . Mitzi Hansen Hold] magnesium hydroxide (MILK OF MAGNESIA) suspension 30 mL  30 mL Oral Daily PRN Mansy, Vernetta Honey, MD      . Mitzi Hansen Hold] methocarbamol (ROBAXIN) tablet 500 mg  500 mg Oral Q6H PRN Mansy, Vernetta Honey, MD      . Mitzi Hansen Hold] metoCLOPramide (REGLAN) tablet 5-10 mg  5-10 mg Oral Q8H PRN Mansy, Vernetta Honey, MD      . Mitzi Hansen Hold] midodrine (PROAMATINE) tablet 2.5 mg  2.5 mg Oral BID WC Mansy, Vernetta Honey, MD      . Mitzi Hansen Hold] multivitamin with minerals tablet 1 tablet  1 tablet Oral Daily Mansy, Jan A, MD      . Mitzi Hansen Hold] nitroGLYCERIN (NITROSTAT) SL tablet 0.4 mg  0.4 mg Sublingual Q5 min PRN Mansy, Vernetta Honey, MD      . Mitzi Hansen Hold] ondansetron [facility]) tablet 4 mg  4 mg Oral Q6H PRN Mansy, Vernetta Honey, MD       Or  . Mitzi Hansen Hold] ondansetron (  ZOFRAN) injection 4 mg  4 mg Intravenous Q6H PRN Mansy, Vernetta Honey, MD      . Mitzi Hansen Hold] polyethylene glycol (MIRALAX / GLYCOLAX) packet 17 g  17 g Oral Daily Mansy, Jan A, MD      . Mitzi Hansen Hold] rosuvastatin (CRESTOR) tablet 10 mg  10 mg Oral Daily Mansy, Jan A, MD      . Mitzi Hansen Hold] senna-docusate (Senokot-S) tablet 1 tablet  1 tablet Oral BID Mansy, Jan A, MD      . Mitzi Hansen Hold] torsemide [facility]) tablet 10 mg  10 mg Oral Daily Mansy, Jan A, MD      . Mitzi Hansen Hold] traMADol [facility] Sark) tablet 50 mg  50 mg Oral Q6H PRN Mansy, Jan A, MD   50 mg at 01/16/21 0925  . [MAR Hold] traZODone (DESYREL) tablet 25 mg  25 mg Oral QHS PRN Mansy, Vernetta Honey, MD      . Mitzi Hansen Hold] vancomycin (VANCOREADY) IVPB 1250 mg/250 mL  1,250 mg Intravenous Q24H Mansy, Vernetta Honey, MD       Past Medical History:  Diagnosis Date  . Aortic stenosis    a. LHC 06/02/17: At least moderate aortic stenosis with a peak to  peak gradient of 22 mmHg; b. TTE 05/2017: EF 55-60%, mild HK basal-midinferior wall, Gr1DD, mod to sev AS w/ mean gradient 21 mmHg, valve area 0.99, mild MR, mildly dilated LA   . CAD (coronary artery disease) 2018   a. inferior STEMI 06/02/2017: LHC 06/02/17: LM 20, D1 20%, o-pLCx 50, p-mRCA 100% s/p PCI/DES, mRCA 50, dRCA 30  . GI bleed    a. noted 06/05/2017  . HLD (hyperlipidemia)   . Obesity    Past Surgical History:  Procedure Laterality Date  . ANKLE RECONSTRUCTION  1956   also ORIF of right arm  . CORONARY STENT INTERVENTION N/A 06/01/2017   Procedure: Coronary/Graft Acute MI Revascularization;  Surgeon: Iran Ouch, MD;  Location: [facility] INVASIVE CV LAB;  Service: Cardiovascular;  Laterality: N/A;  . INTRAMEDULLARY (IM) NAIL INTERTROCHANTERIC Left 10/03/2020   Procedure: INTRAMEDULLARY (IM) NAIL INTERTROCHANTRIC;  Surgeon: Signa Kell, MD;  Location: [facility] ORS;  Service: Orthopedics;  Laterality: Left;  . LEFT HEART CATH AND CORONARY ANGIOGRAPHY N/A 06/01/2017   Procedure: LEFT HEART CATH AND CORONARY ANGIOGRAPHY;  Surgeon: Iran Ouch, MD;  Location: [facility] INVASIVE CV LAB;  Service: Cardiovascular;  Laterality: N/A;   Social History Social History   Tobacco Use  . Smoking status: Former Smoker    Packs/day: 0.50    Years: 50.00    Pack years: 25.00    Types: Cigarettes    Quit date: 06/01/2017    Years since quitting: 3.6  . Smokeless tobacco: Never Used  Vaping Use  . Vaping Use: Never used  Substance Use Topics  . Alcohol use: No  . Drug use: No   Family History Family History  Problem Relation Age of Onset  . Valvular heart disease Mother   . Diabetes Mellitus II Daughter   Denies family history of peripheral artery disease, venous disease or renal disease or aneurysmal disease.  No Known Allergies  REVIEW OF SYSTEMS (Negative unless checked)  Constitutional: [] Weight loss  [] Fever  [] Chills Cardiac: [] Chest pain   [] Chest pressure    [] Palpitations   [] Shortness of breath when laying flat   [] Shortness of breath at rest   [] Shortness of breath with exertion. Vascular:  [x] Pain in legs with walking   [x] Pain in legs at rest   [  x]Pain in legs when laying flat   [] Claudication   [] Pain in feet when walking  [] Pain in feet at rest  [] Pain in feet when laying flat   [] History of DVT   [] Phlebitis   [x] Swelling in legs   [] Varicose veins   [] Non-healing ulcers Pulmonary:   [] Uses home oxygen   [] Productive cough   [] Hemoptysis   [] Wheeze  [] COPD   [] Asthma Neurologic:  [] Dizziness  [] Blackouts   [] Seizures   [] History of stroke   [] History of TIA  [] Aphasia   [] Temporary blindness   [] Dysphagia   [] Weakness or numbness in arms   [] Weakness or numbness in legs Musculoskeletal:  [] Arthritis   [] Joint swelling   [] Joint pain   [] Low back pain Hematologic:  [] Easy bruising  [] Easy bleeding   [] Hypercoagulable state   [] Anemic  [] Hepatitis Gastrointestinal:  [] Blood in stool   [] Vomiting blood  [] Gastroesophageal reflux/heartburn   [] Difficulty swallowing. Genitourinary:  [] Chronic kidney disease   [] Difficult urination  [] Frequent urination  [] Burning with urination   [] Blood in urine Skin:  [] Rashes   [] Ulcers   [] Wounds Psychological:  [] History of anxiety   []  History of major depression.  Physical Examination  Vitals:   01/16/21 0436 01/16/21 0750 01/16/21 1138 01/16/21 1340  BP: (!) 102/53 (!) 104/47 106/68 126/69  Pulse: 89 85 87 88  Resp: 19 16 16 17   Temp: 99 F (37.2 C) 98.9 F (37.2 C) 98.4 F (36.9 C) 98.4 F (36.9 C)  TempSrc:  Oral Oral Oral  SpO2: 94% 91% 96% 94%  Weight:      Height:       Body mass index is 39.11 kg/m. Gen:  WD/WN, NAD Head: [place]/AT, No temporalis wasting. Prominent temp pulse not noted. Ear/Nose/Throat: Hearing grossly intact, nares w/o erythema or drainage, oropharynx w/o Erythema/Exudate Eyes: Sclera non-icteric, conjunctiva clear Neck: Trachea midline.  No JVD.  Pulmonary:  Good air  movement, respirations not labored, equal bilaterally.  Cardiac: RRR, normal S1, S2. Vascular:  Vessel Right Left  Radial Palpable Palpable  Ulnar Palpable Palpable                               Gastrointestinal: soft, non-tender/non-distended. No guarding/reflex.  No palpable masses. Musculoskeletal: M/S 5/5 throughout.  Extremities without ischemic changes.  No deformity or atrophy. No edema. Neurologic: Sensation grossly intact in extremities.  Symmetrical.  Speech is fluent. Motor exam as listed above. Psychiatric: Judgment intact, Mood & affect appropriate for pt's clinical situation. Dermatologic: Left hip wound Lymph : No Cervical, Axillary, or Inguinal lymphadenopathy.  CBC Lab Results  Component Value Date   WBC 8.8 01/16/2021   HGB 11.2 (L) 01/16/2021   HCT 36.4 01/16/2021   MCV 78.6 (L) 01/16/2021   PLT 269 01/16/2021   BMET    Component Value Date/Time   NA 133 (L) 01/16/2021 0410   K 4.3 01/16/2021 0410   CL 102 01/16/2021 0410   CO2 25 01/16/2021 0410   GLUCOSE 109 (H) 01/16/2021 0410   BUN 14 01/16/2021 0410   CREATININE 0.74 01/16/2021 0410   CALCIUM 8.2 (L) 01/16/2021 0410   GFRNONAA >60 01/16/2021 0410   GFRAA >60 05/02/2018 0336   Estimated Creatinine Clearance: 62.3 mL/min (by C-G formula based on SCr of 0.74 mg/dL).  COAG Lab Results  Component Value Date   INR 1.1 10/08/2020   INR 1.1 10/02/2020   INR 1.13 06/05/2017   Radiology CT  HIP LEFT W CONTRAST  Result Date: 01/15/2021 CLINICAL DATA:  Drainage at left hip surgical site EXAM: CT OF THE LOWER LEFT EXTREMITY WITH CONTRAST TECHNIQUE: Multidetector CT imaging of the lower left extremity was performed according to the standard protocol following intravenous contrast administration. CONTRAST:  OMNIPAQUE IOHEXOL 300 MG/ML  SOLN COMPARISON:  Fluoroscopic images dated 10/03/2020 FINDINGS: Bones/Joint/Cartilage Status post ORIF of a comminuted intertrochanteric fracture of the left femur  with antegrade long intramedullary rod and 2 proximal lag screws. No bridging bone formation across the fracture site. Displaced greater and lesser trochanteric fracture fragments, grossly similar alignment compared to intraoperative fluoroscopic images. No appreciable hip joint effusion. No evidence of acute fracture or dislocation. No evidence to suggest hardware loosening. Visualized portion of the left hemipelvis is intact. Ligaments Suboptimally assessed by CT. Muscles and Tendons No acute musculotendinous abnormality by CT. Soft tissues Skin thickening overlies the lateral aspect of the left hip. Small fluid collection within the subcutaneous soft tissues, likely along a surgical incision site the level of the left iliac wing measuring approximately 4.1 x 0.8 x 3.2 cm (series 5, image 28). Mild subcutaneous edema throughout the lateral aspect of the left hip. Partially visualized infrarenal abdominal aortic aneurysm with extensive mural thrombus measuring approximately 6.7 x 6.1 cm (series 5, image 1). Proximal right common iliac artery aneurysm measuring approximately 2.5 cm. Extensive vascular atherosclerotic calcification. No left inguinal lymphadenopathy. IMPRESSION: 1. Status post ORIF of a comminuted intertrochanteric fracture of the left femur with displaced greater and lesser trochanteric fracture fragments, grossly similar alignment compared to intraoperative fluoroscopic images. No bridging bone formation across the fracture site. 2. Small fluid collection within the subcutaneous soft tissues, likely along a surgical incision site the level of the left iliac wing measuring approximately 4.1 x 0.8 x 3.2 cm. Findings may represent a postoperative seroma or hematoma. Infected fluid collection would be difficult to exclude by imaging alone. 3. Partially visualized infrarenal abdominal aortic aneurysm with extensive mural thrombus measuring approximately 6.7 x 6.1 cm. Recommend referral to a vascular  specialist. This recommendation follows ACR consensus guidelines: White Paper of the ACR Incidental Findings Committee II on Vascular Findings. J Am Coll Radiol 2013; 10:789-794. 4. Proximal right common iliac artery aneurysm measuring approximately 2.5 cm. Electronically Signed   By: Duanne Guess D.O.   On: 01/15/2021 18:45   Assessment/Plan Deby Adger is a 87 year old female with medical history significant for coronary artery disease, aortic stenosis and dyslipidemia who underwent left hip intramedullary nailing over 3 months ago by Dr. Allena Katz who presents to the emergency room with acute onset of left hip pain and swelling, tenderness, and pain found to have possible fluid collection to left hip  1.  Possible infrarenal abdominal aortic aneurysm: Incidental finding of an infrarenal abdominal aortic aneurysm possibly measuring over 6cm (6.7cm x 6.1cm) during CT of left hip yesterday. Patient and her son who is at the bedside deny any prior knowledge of being diagnosed with an aneurysm, familial history, abdominal pain or back pain.  Would like to order a CTA in an attempt to image the whole aorta, assess her anatomy in preparation for possible repair, etc however the patient received contrast with her CT of the hip yesterday.  Will plan on ordering a CTA chest / abdomen / pelvis tomorrow morning. BMP also ordered. I had a long conversation with the son and the patient in regard to the pathophysiology of aneurysms and how we go about repairing them.  I did show the son and the patient pictures of aneurysms as well as what an aneurysm with a stent graft repair would look like using the internet. They expressed their understanding and are in agreement with the plan.  2.  Possible left hip infection: Patient will be taken to the operating room by Dr. Allena Katz today to undergo I&D. It is imperative that there is no active infection and if we are to proceed with repair.  Pending blood cultures.  ID is  also following. An infected stent graft repair would be catastrophic for the patient. I had a very long conversation in regard to this with the son and the patient and they are in agreement and understand.  3.  Severe aortic stenosis: Patient notes that her cardiologist is Dr. Kirke Corin   Discussed in detail with Dr. Romie Jumper, PA-C  01/16/2021 2:01 PM  This note was created with Dragon medical transcription system.  Any error is purely unintentional.

## 2021-01-16 NOTE — Transfer of Care (Signed)
Immediate Anesthesia Transfer of Care Note  Patient: Monica [name]  Procedure(s) Performed: IRRIGATION AND DEBRIDEMENT HIP (Left Hip)  Patient Location: PACU  Anesthesia Type:General  Level of Consciousness: drowsy  Airway & Oxygen Therapy: Patient Spontanous Breathing and Patient connected to face mask oxygen  Post-op Assessment: Report given to RN  Post vital signs: stable  Last Vitals:  Vitals Value Taken Time  BP 125/85 01/16/21 1505  Temp 36.4 C 01/16/21 1505  Pulse 80 01/16/21 1508  Resp 16 01/16/21 1508  SpO2 97 % 01/16/21 1508  Vitals shown include unvalidated device data.  Last Pain:  Vitals:   01/16/21 1340  TempSrc: Oral  PainSc: 7          Complications: No complications documented.

## 2021-01-16 NOTE — Consult Note (Signed)
ORTHOPAEDIC CONSULTATION  REQUESTING PHYSICIAN: Enedina Finner, MD  Chief Complaint:   Left hip pain and draining wound  History of Present Illness: Monica [name] is a 87 y.o. female who underwent left hip intramedullary nailing by me on 10/03/2020.  The patient has not made it to her postoperative appointments with me due to impaired mobility.  I contacted the patient About her missed appointments on on 12/26/2020, at which point the patient stated that she was doing quite well in regards to her left hip.  At that point she had no pain in the hip and her wounds were healing well.  She stated that her knee was limiting her mobility as opposed to her hip.  However, approximately 5 days ago the patient noticed increasing left hip pain.  There has been increased erythema about the proximal incision.  Approximately 3 days ago, there was mild bloody drainage from the distal aspect of the proximal incision and this has persisted since that time.  Given the patient's dramatically increased pain, erythema, and drainage, I recommended that the patient come to the emergency department as she cannot mobilize herself to be evaluated as an outpatient.  Evaluation the emergency department showed a subcutaneous fluid collection on CT scan.  Additionally, CT scan showed a large infrarenal aortic aneurysm and Dr. Gilda Crease with vascular surgery was consulted.  She was started on IV antibiotics in the emergency department. Blood cultures were obtained.  These showed MRSA.  Past Medical History:  Diagnosis Date  . Aortic stenosis    a. LHC 06/02/17: At least moderate aortic stenosis with a peak to peak gradient of 22 mmHg; b. TTE 05/2017: EF 55-60%, mild HK basal-midinferior wall, Gr1DD, mod to sev AS w/ mean gradient 21 mmHg, valve area 0.99, mild MR, mildly dilated LA   . CAD (coronary artery disease) 2018   a. inferior STEMI 06/02/2017: LHC 06/02/17: LM  20, D1 20%, o-pLCx 50, p-mRCA 100% s/p PCI/DES, mRCA 50, dRCA 30  . GI bleed    a. noted 06/05/2017  . HLD (hyperlipidemia)   . Obesity    Past Surgical History:  Procedure Laterality Date  . ANKLE RECONSTRUCTION  1956   also ORIF of right arm  . CORONARY STENT INTERVENTION N/A 06/01/2017   Procedure: Coronary/Graft Acute MI Revascularization;  Surgeon: Iran Ouch, MD;  Location: [facility] IN[facility]ASIVE [facility];  Service: Cardiovascular;  Laterality: N/A;  . INTRAMEDULLARY (IM) NAIL INTERTROCHANTERIC Left 10/03/2020   Procedure: INTRAMEDULLARY (IM) NAIL INTERTROCHANTRIC;  Surgeon: Signa Kell, MD;  Location: [facility] ORS;  Service: Orthopedics;  Laterality: Left;  . LEFT HEART CATH AND CORONARY ANGIOGRAPHY N/A 06/01/2017   Procedure: LEFT HEART CATH AND CORONARY ANGIOGRAPHY;  Surgeon: Iran Ouch, MD;  Location: [facility] INV[facility] CV [facility];  Service: Cardiovascular;  Laterality: N/A;   Social History   Socioeconomic History  . Marital status: Married    Spouse name: Not on file  . Number of children: Not on file  . Years of education: Not on file  . Highest education level: Not on file  Occupational History  . Occupation: retired  Tobacco Use  . Smoking status: Former Smoker    Packs/day: 0.50    Years: 50.00    Pack years: 25.00    Types: Cigarettes    Quit date: 06/01/2017    Years since quitting: 3.6  . Smokeless tobacco: Never Used  Vaping Use  . Vaping Use: Never used  Substance and Sexual Activity  . Alcohol use: No  .  Drug use: No  . Sexual activity: Never    Partners: Female  Other Topics Concern  . Not on file  Social History Narrative  . Not on file   Social Determinants of Health   Financial Resource Strain: Not on file  Food Insecurity: Not on file  Transportation Needs: Not on file  Physical Activity: Not on file  Stress: Not on file  Social Connections: Not on file   Family History  Problem Relation Age of Onset  . Valvular heart disease Mother   .  Diabetes Mellitus II Daughter    No Known Allergies Prior to Admission medications   Medication Sig Start Date End Date Taking? Authorizing Provider  acetaminophen (TYLENOL) 325 MG tablet Take 2 tablets (650 mg total) by mouth every 6 (six) hours as needed for mild pain, fever or headache. 10/09/20 10/09/21  Esaw Grandchild A, DO  aspirin 81 MG EC tablet Take 81 mg by mouth daily.    [provider]  bisacodyl (DULCOLAX) 5 MG EC tablet Take 1 tablet (5 mg total) by mouth daily as needed for moderate constipation. 10/09/20   Pennie Banter, DO  docusate sodium (COLACE) 100 MG capsule Take 1 capsule (100 mg total) by mouth 2 (two) times daily. 10/09/20   Esaw Grandchild A, DO  enoxaparin (LOVENOX) 40 MG/0.4ML injection Inject 0.4 mLs (40 mg total) into the skin daily for 14 days. 10/04/20 10/18/20  Dedra Skeens, PA-C  feeding supplement (ENSURE ENLIVE / ENSURE PLUS) LIQD Take 237 mLs by mouth 2 (two) times daily between meals. 10/09/20   Pennie Banter, DO  methocarbamol (ROBAXIN) 500 MG tablet Take 1 tablet (500 mg total) by mouth every 6 (six) hours as needed for muscle spasms. 10/09/20   Pennie Banter, DO  metoCLOPramide (REGLAN) 5 MG tablet Take 1-2 tablets (5-10 mg total) by mouth every 8 (eight) hours as needed for nausea (if ondansetron (ZOFRAN) ineffective.). 10/09/20   Pennie Banter, DO  midodrine (PROAMATINE) 2.5 MG tablet Take 1 tablet (2.5 mg total) by mouth 2 (two) times daily with a meal. 10/09/20   Pennie Banter, DO  Multiple Vitamin (MULTIVITAMIN WITH MINERALS) TABS tablet Take 1 tablet by mouth daily. 10/10/20   Pennie Banter, DO  nitroGLYCERIN (NITROSTAT) 0.4 MG SL tablet Place 1 tablet (0.4 mg total) under the tongue every 5 (five) minutes as needed for chest pain. 06/04/17   Shaune Pollack, MD  ondansetron (ZOFRAN) 4 MG tablet Take 1 tablet (4 mg total) by mouth every 6 (six) hours as needed for nausea. 10/09/20   Pennie Banter, DO  polyethylene glycol (MIRALAX /  GLYCOLAX) 17 g packet Take 17 g by mouth daily. 10/10/20   Esaw Grandchild A, DO  rosuvastatin (CRESTOR) 10 MG tablet Take 1 tablet by mouth once daily Patient taking differently: Take 10 mg by mouth daily. 07/24/20   Iran Ouch, MD  senna-docusate (SENOKOT-S) 8.6-50 MG tablet Take 1 tablet by mouth 2 (two) times daily. 10/09/20   Pennie Banter, DO  torsemide (DEMADEX) 10 MG tablet Take 1 tablet (10 mg total) by mouth daily. 10/09/20   Pennie Banter, DO  traMADol (ULTRAM) 50 MG tablet Take 1 tablet (50 mg total) by mouth every 6 (six) hours as needed for moderate pain. 10/04/20   Dedra Skeens, PA-C   Recent Labs    01/15/21 1643 01/16/21 0410  WBC 10.0 8.8  HGB 12.6 11.2*  HCT 41.5 36.4  PLT 316 269  K 4.1 4.3  CL 100 102  CO2 27 25  BUN 16 14  CREATININE 0.80 0.74  GLUCOSE 120* 109*  CALCIUM 8.5* 8.2*   CT HIP LEFT W CONTRAST  Result Date: 01/15/2021 CLINICAL DATA:  Drainage at left hip surgical site EXAM: CT OF THE LOWER LEFT EXTREMITY WITH CONTRAST TECHNIQUE: Multidetector CT imaging of the lower left extremity was performed according to the standard protocol following intravenous contrast administration. CONTRAST:  OMNIPAQUE IOHEXOL 300 MG/ML  SOLN COMPARISON:  Fluoroscopic images dated 10/03/2020 FINDINGS: Bones/Joint/Cartilage Status post ORIF of a comminuted intertrochanteric fracture of the left femur with antegrade long intramedullary rod and 2 proximal lag screws. No bridging bone formation across the fracture site. Displaced greater and lesser trochanteric fracture fragments, grossly similar alignment compared to intraoperative fluoroscopic images. No appreciable hip joint effusion. No evidence of acute fracture or dislocation. No evidence to suggest hardware loosening. Visualized portion of the left hemipelvis is intact. Ligaments Suboptimally assessed by CT. Muscles and Tendons No acute musculotendinous abnormality by CT. Soft tissues Skin thickening overlies the  lateral aspect of the left hip. Small fluid collection within the subcutaneous soft tissues, likely along a surgical incision site the level of the left iliac wing measuring approximately 4.1 x 0.8 x 3.2 cm (series 5, image 28). Mild subcutaneous edema throughout the lateral aspect of the left hip. Partially visualized infrarenal abdominal aortic aneurysm with extensive mural thrombus measuring approximately 6.7 x 6.1 cm (series 5, image 1). Proximal right common iliac artery aneurysm measuring approximately 2.5 cm. Extensive vascular atherosclerotic calcification. No left inguinal lymphadenopathy. IMPRESSION: 1. Status post ORIF of a comminuted intertrochanteric fracture of the left femur with displaced greater and lesser trochanteric fracture fragments, grossly similar alignment compared to intraoperative fluoroscopic images. No bridging bone formation across the fracture site. 2. Small fluid collection within the subcutaneous soft tissues, likely along a surgical incision site the level of the left iliac wing measuring approximately 4.1 x 0.8 x 3.2 cm. Findings may represent a postoperative seroma or hematoma. Infected fluid collection would be difficult to exclude by imaging alone. 3. Partially visualized infrarenal abdominal aortic aneurysm with extensive mural thrombus measuring approximately 6.7 x 6.1 cm. Recommend referral to a vascular specialist. This recommendation follows ACR consensus guidelines: White Paper of the ACR Incidental Findings Committee II on Vascular Findings. J Am Coll Radiol 2013; 10:789-794. 4. Proximal right common iliac artery aneurysm measuring approximately 2.5 cm. Electronically Signed   By: Duanne Guess D.O.   On: 01/15/2021 18:45     Positive ROS: All other systems have been reviewed and were otherwise negative with the exception of those mentioned in the HPI and as above.  Physical Exam: BP 106/68 (BP Location: Right Arm)   Pulse 87   Temp 98.4 F (36.9 C) (Oral)    Resp 16   Ht 5\' 5"  (1.651 m)   Wt 106.6 kg   SpO2 96%   BMI 39.11 kg/m  General:  Alert, no acute distress Psychiatric:  Patient is competent for consent with normal mood and affect   Cardiovascular:  No pedal edema, regular rate and rhythm Respiratory:  No wheezing, non-labored breathing GI:  Abdomen is soft and non-tender Skin:  No lesions in the area of chief complaint, no erythema Neurologic:  Sensation intact distally, CN grossly intact Lymphatic:  No axillary or cervical lymphadenopathy  Orthopedic Exam:  Left LE: 5/5 DF/PF/EHL SILT s/s/t/sp/dp distr Foot wwp Able to perform mild range of motion of the hip  without significantly increased pain. Distal and greater trochanteric incisions are well-healed without erythema or drainage.  There is significant erythema about the proximal incision radiating distally.  The distal approximately 4 cm of the proximal incision is unhealed and there is active mildly purulent drainage from this region.  There is also significant tenderness about this region.  Imaging:  As above: CT shows a fluid collection subcutaneously along the proximal incision.  There is no significant bridging bony callus about the fracture site.  Additionally there is a large aortic aneurysm.  Assessment/Plan: 87 year old female s/p left hip IM nailing on 10/03/2020 with postoperative wound infection.  1.  We discussed the clinical findings as well as the CT scan suggesting a fluid collection.  Her clinical picture is consistent with wound infection we discussed the treatment options of continued antibiotics, but this would be unlikely to fully resolve the infection given the presence of fluid collection.  Therefore, we agreed to proceed with surgical intervention in the form of left hip irrigation and debridement.  We discussed that there was no significant healing on CT scan, but we will plan to leave the hardware in place and we could try other modalities such as bone  stimulator to help stimulate bony healing.  We discussed that she may also be on chronic antibiotics if there is a chance that her hardware was infected as removing the IM nail would likely do more harm than good at this point. We also discussed that the patient was at high risk of anesthesia given the findings of aortic aneurysm and her underlying medical comorbidities.  Patient was understanding and is in agreement to proceed.  2.  Keep n.p.o. until OR later today for planned surgery.  3.  Briefly discussed aortic aneurysm with anesthesia team and vascular surgery team.  We will plan to obtain full work-up of this after surgery as treatment would have to wait until infection was addressed.    Signa KellSunny Maycen Degregory   01/16/2021 1:35 PM

## 2021-01-16 NOTE — Op Note (Addendum)
Operative Note    SURGERY DATE: 01/16/2021   PRE-OP DIAGNOSIS:  1. Left hip wound infection   POST-OP DIAGNOSIS:  1. Left hip wound infection   PROCEDURES:  1.  Left hip postoperative wound irrigation and debridement 2.  Left hip excisional debridement of muscle and fascia 3.  Left hip application of vacuum-assisted closure device   SURGEON: Rosealee Albee, MD   ANESTHESIA: Gen    ESTIMATED BLOOD LOSS: 5cc   TOTAL IV FLUIDS: per anesthesia  DRAIN: None   INDICATION(S):  Monica [name] is a 87 y.o. female who initially underwent left hip intramedullary nailing by me for an intertrochanteric femur fracture on 10/03/2020.  The patient was unable to keep her follow-up appointments due to impaired mobility.  Approximately 3 weeks ago, I had called the patient to discuss the progress of her hip.  At that point the patient stated she had no significant difficulties with her left hip and that her incisions were healing well.  However, approximately 5 days ago, the patient had increased left hip pain and erythema about the proximal incision.  3 days ago, she developed drainage about the distal portion of the proximal incision.  She presented to the emergency department where a CT scan showed a subcutaneous fluid collection in this region.  Additionally, blood cultures were positive for methicillin-resistant staph epidermidis.  Given these findings, we elected to proceed with above procedures after discussion of the risk, benefits, and alternatives.  Of note, the patient was found to have an incidental finding of an aortic aneurysm.  Vascular surgery team has been consulted.  Recommendation was to proceed with irrigation and debridement prior to further work-up and intervention for aortic aneurysm..   OPERATIVE FINDINGS: Mildly purulent subcutaneous material at the distal aspect of the proximal incision.   OPERATIVE REPORT:   I identified Monica [name] in the pre-operative holding area. Informed  consent was obtained and the surgical site was marked. I reviewed the risks and benefits of the proposed surgical intervention and the patient wished to proceed. The patient was transferred to the operative suite and general anesthesia was administered. The patient was placed in the lateral decubitus position. All down side pressure points were appropriately padded. The extremity was then prepped and draped in standard fashion. A time out was performed confirming the correct extremity, correct patient, and correct procedure.  The patient was on broad-spectrum IV antibiotics since the time of admission so additional preoperative antibiotics were not administered.   The distal 5 cm of the proximal incision was incised.  There was mildly purulent material underlying this region.  Dissection was carried down through the subcutaneous tissue bluntly.  There was no gross purulent pocket.  2 culture samples were taken of this tissue.  Tract reached to the proximal aspect of the greater trochanter, but there was no grossly purulent material in this region.  A third culture sample was taken of the deep tissue about the trochanter.  An excisional debridement involving the subcutaneous tissue, fascia, and gluteal musculature was performed using a combination of a curette and rongeur in order to remove any loose and friable tissue. A 15 blade was used to remove unhealthy regions of the skin back to a healthy, bleeding surface. The wound was thoroughly irrigated.  The subdermal layer closed closed with 0-PDS.  The skin was closed with 3-0 Prolene sutures in a vertical mattress fashion.  A Prevena dressing was applied.  The patient was then awakened from anesthesia and transferred to  the PACU without further noted complications.   POSTOPERATIVE PLAN: The patient will be continued on broad-spectrum IV antibiotics.  Infectious diseases team has been consulted by the primary medical team.  Lovenox 40 mg/daily for DVT  prophylaxis can start on postoperative day 1.  Weightbearing as tolerated all right lower extremity.  PT/OT on postoperative day #1 pending other studies and work-up of known aortic aneurysm.Marland Kitchen

## 2021-01-16 NOTE — Anesthesia Preprocedure Evaluation (Signed)
Anesthesia Evaluation  Patient identified by MRN, date of birth, ID band Patient awake    Reviewed: Allergy & Precautions, NPO status , Patient's Chart, lab work & pertinent test results  History of Anesthesia Complications Negative for: history of anesthetic complications  Airway Mallampati: II  TM Distance: >3 FB Neck ROM: Full    Dental  (+) Poor Dentition, Missing   Pulmonary neg sleep apnea, neg COPD, former smoker,    breath sounds clear to auscultation- rhonchi (-) wheezing      Cardiovascular Exercise Tolerance: Poor + CAD, + Past MI, + Cardiac Stents (2018) and + Peripheral Vascular Disease (AAA)  + Valvular Problems/Murmurs (severe AS) AS  Rhythm:Regular Rate:Normal - Systolic murmurs and - Diastolic murmurs    Neuro/Psych neg Seizures negative neurological ROS  negative psych ROS   GI/Hepatic negative GI ROS, Neg liver ROS,   Endo/Other  negative endocrine ROSneg diabetes  Renal/GU negative Renal ROS     Musculoskeletal negative musculoskeletal ROS (+)   Abdominal (+) + obese,   Peds  Hematology negative hematology ROS (+)   Anesthesia Other Findings Past Medical History: No date: Aortic stenosis     Comment:  a. LHC 06/02/17: At least moderate aortic stenosis with               a peak to peak gradient of 22 mmHg; b. TTE 05/2017: EF               55-60%, mild HK basal-midinferior wall, Gr1DD, mod to sev              AS w/ mean gradient 21 mmHg, valve area 0.99, mild MR,               mildly dilated LA  2018: CAD (coronary artery disease)     Comment:  a. inferior STEMI 06/02/2017: LHC 06/02/17: LM 20, D1               20%, o-pLCx 50, p-mRCA 100% s/p PCI/DES, mRCA 50, dRCA 30 No date: GI bleed     Comment:  a. noted 06/05/2017 No date: HLD (hyperlipidemia) No date: Obesity   Reproductive/Obstetrics                             Anesthesia Physical Anesthesia Plan  ASA:  III and emergent  Anesthesia Plan: General   Post-op Pain Management:    Induction: Intravenous  PONV Risk Score and Plan: 2 and Ondansetron and Dexamethasone  Airway Management Planned: Oral ETT  Additional Equipment:   Intra-op Plan:   Post-operative Plan: Extubation in OR  Informed Consent: I have reviewed the patients History and Physical, chart, labs and discussed the procedure including the risks, benefits and alternatives for the proposed anesthesia with the patient or authorized representative who has indicated his/her understanding and acceptance.     Dental advisory given  Plan Discussed with: CRNA and Anesthesiologist  Anesthesia Plan Comments: (Discussed high risk for cardiac and pulmonary complications and death in setting of severe/critical AS and unrepaired AAA. Pt requires management of hip infection prior to AAA repair. )        Anesthesia Quick Evaluation

## 2021-01-16 NOTE — Consult Note (Signed)
NAME: Monica [name]  DOB: November 23, 1933  MRN: [id number]  Date/Time: 01/16/2021 9:40 AM  REQUESTING PROVIDER Subjective:  REASON FOR CONSULT:  ? Monica [name] is a 87 y.o. female with a history of coronary artery disease status post stent placement of the proximal right coronary artery,, aortic stenosis, hyperlipidemia, GI bleed, left intertrochanteric hip fracture sustained in February 2022 for which she had intramedullary nailing with a cephalomedullary device on 10/03/2020 admitted to the hospital with erythema, discharge from the surgical site. As per patient after the surgery she was doing okay.  The site was healing well and had not given her any trouble. Her mobility had diminished and she was pretty much in bed or chair taking a few steps. She had a follow-up with the orthopedic surgeon in March and was doing well.  She had a telephone encounter visit on 12/26/2020 and was still doing pretty well without significant pain and appropriate healing of the incision.  But 4 days ago patient started to have increasing pain and redness around the most proximal incision.  Then 2 days ago the patient had bloody drainage with 2 to 3 cm dehiscence of the wound.  She contacted the orthopedic surgeon and she was asked to go to the ED because of transportation issues going to his clinic..  She got admitted on 01/15/2021.  Vitals in the ED 106 x 55, temp 99, heart rate 91, sats 100%.  WBC was 10, Hb 12.6, platelet 316 and creatinine 0.80.  CT hip with contrast showed small fluid collection within the subcutaneous soft tissue along the surgical incision site at the level of the left iliac wing measuring approximately 4 x 1 cm into 0.8 into 3.2 cm.  Also seen was ORIF of a comminuted intertrochanteric fracture of the left femur with displaced greater and lesser trochanteric fracture fragments grossly similar alignment compared to intraoperative findings.  No bridging bone formation across the fracture site was noted.  Also  visualized was an infrarenal abdominal aortic aneurysm with extensive mural thrombus measuring 6.7 into 6.1 cm. Blood culture was sent and 1 out of 4 bottle positive for Staph epidermidis.  Patient was taken for surgery on 01/16/2021.  Underwent left hip wound irrigation and debridement with excisional debridement of muscle and fascia and application of wound VAC.Marland Kitchen  The surgical note states that there was mildly purulent material underlying the incision site.  There was no gross purulent pocket.  Tract reached to the proximal aspect of the greater trochanter but there was no grossly purulent material in this region.  Cultures were sent from the deep tissue about the trochanter.  And an excisional debridement involving the subcutaneous tissue, fascia and gluteal musculature was performed using a combination of curette and rondure in order to remove any loose and friable tissue. I am seeing the patient for antibiotic management.  Patient is currently on vancomycin and cefepime.  Past Medical History:  Diagnosis Date  . Aortic stenosis    a. LHC 06/02/17: At least moderate aortic stenosis with a peak to peak gradient of 22 mmHg; b. TTE 05/2017: EF 55-60%, mild HK basal-midinferior wall, Gr1DD, mod to sev AS w/ mean gradient 21 mmHg, valve area 0.99, mild MR, mildly dilated LA   . CAD (coronary artery disease) 2018   a. inferior STEMI 06/02/2017: LHC 06/02/17: LM 20, D1 20%, o-pLCx 50, p-mRCA 100% s/p PCI/DES, mRCA 50, dRCA 30  . GI bleed    a. noted 06/05/2017  . HLD (hyperlipidemia)   . Obesity  Past Surgical History:  Procedure Laterality Date  . ANKLE RECONSTRUCTION  1956   also ORIF of right arm  . CORONARY STENT INTERVENTION N/A 06/01/2017   Procedure: Coronary/Graft Acute MI Revascularization;  Surgeon: Wellington Hampshire, MD;  Location: [facility];  Service: Cardiovascular;  Laterality: N/A;  . INTRAMEDULLARY (IM) NAIL INTERTROCHANTERIC Left 10/03/2020   Procedure: INTRAMEDULLARY  (IM) NAIL INTERTROCHANTRIC;  Surgeon: Leim Fabry, MD;  Location: [facility];  Service: Orthopedics;  Laterality: Left;  . LEFT HEART CATH AND CORONARY ANGIOGRAPHY N/A 06/01/2017   Procedure: LEFT HEART CATH AND CORONARY ANGIOGRAPHY;  Surgeon: Wellington Hampshire, MD;  Location: [facility];  Service: Cardiovascular;  Laterality: N/A;    Social History   Socioeconomic History  . Marital status: Married    Spouse name: Not on file  . Number of children: Not on file  . Years of education: Not on file  . Highest education level: Not on file  Occupational History  . Occupation: retired  Tobacco Use  . Smoking status: Former Smoker    Packs/day: 0.50    Years: 50.00    Pack years: 25.00    Types: Cigarettes    Quit date: 06/01/2017    Years since quitting: 3.6  . Smokeless tobacco: Never Used  Vaping Use  . Vaping Use: Never used  Substance and Sexual Activity  . Alcohol use: No  . Drug use: No  . Sexual activity: Never    Partners: Female  Other Topics Concern  . Not on file  Social History Narrative  . Not on file   Social Determinants of Health   Financial Resource Strain: Not on file  Food Insecurity: Not on file  Transportation Needs: Not on file  Physical Activity: Not on file  Stress: Not on file  Social Connections: Not on file  Intimate Partner Violence: Not on file    Family History  Problem Relation Age of Onset  . Valvular heart disease Mother   . Diabetes Mellitus II Daughter    No Known Allergies I? Current Facility-Administered Medications  Medication Dose Route Frequency Provider Last Rate Last Admin  . 0.9 %  sodium chloride infusion   Intravenous Continuous Mansy, Jan A, MD 100 mL/hr at 01/16/21 0345 Infusion Verify at 01/16/21 0345  . acetaminophen (TYLENOL) tablet 650 mg  650 mg Oral Q6H PRN Mansy, Jan A, MD       Or  . acetaminophen (TYLENOL) suppository 650 mg  650 mg Rectal Q6H PRN Mansy, Jan A, MD      . bisacodyl (DULCOLAX) EC tablet  5 mg  5 mg Oral Daily PRN Mansy, Jan A, MD      . ceFEPIme (MAXIPIME) 2 g in sodium chloride 0.9 % 100 mL IVPB  2 g Intravenous Q8H Mansy, Jan A, MD 200 mL/hr at 01/16/21 0911 2 g at 01/16/21 0911  . docusate sodium (COLACE) capsule 100 mg  100 mg Oral BID Mansy, Jan A, MD      . feeding supplement (ENSURE ENLIVE / ENSURE PLUS) liquid 237 mL  237 mL Oral BID BM Mansy, Jan A, MD      . magnesium hydroxide (MILK OF MAGNESIA) suspension 30 mL  30 mL Oral Daily PRN Mansy, Jan A, MD      . methocarbamol (ROBAXIN) tablet 500 mg  500 mg Oral Q6H PRN Mansy, Jan A, MD      . metoCLOPramide (REGLAN) tablet 5-10 mg  5-10 mg Oral Q8H PRN  Mansy, Jan A, MD      . midodrine (PROAMATINE) tablet 2.5 mg  2.5 mg Oral BID WC Mansy, Jan A, MD      . multivitamin with minerals tablet 1 tablet  1 tablet Oral Daily Mansy, Jan A, MD      . nitroGLYCERIN (NITROSTAT) SL tablet 0.4 mg  0.4 mg Sublingual Q5 min PRN Mansy, Jan A, MD      . ondansetron [facility]) tablet 4 mg  4 mg Oral Q6H PRN Mansy, Jan A, MD       Or  . ondansetron [facility]) injection 4 mg  4 mg Intravenous Q6H PRN Mansy, Jan A, MD      . polyethylene glycol (MIRALAX / GLYCOLAX) packet 17 g  17 g Oral Daily Mansy, Jan A, MD      . rosuvastatin (CRESTOR) tablet 10 mg  10 mg Oral Daily Mansy, Jan A, MD      . senna-docusate (Senokot-S) tablet 1 tablet  1 tablet Oral BID Mansy, Jan A, MD      . torsemide [facility]) tablet 10 mg  10 mg Oral Daily Mansy, Jan A, MD      . traMADol [facility]) tablet 50 mg  50 mg Oral Q6H PRN Mansy, Jan A, MD   50 mg at 01/16/21 0925  . traZODone (DESYREL) tablet 25 mg  25 mg Oral QHS PRN Mansy, Jan A, MD      . vancomycin (VANCOREADY) IVPB 1250 mg/250 mL  1,250 mg Intravenous Q24H Mansy, Jan A, MD         Abtx:  Anti-infectives (From admission, onward)   Start     Dose/Rate Route Frequency Ordered Stop   01/16/21 1900  vancomycin (VANCOREADY) IVPB 1250 mg/250 mL        1,250 mg 166.7 mL/hr over 90 Minutes Intravenous Every 24  hours 01/15/21 2318     01/16/21 0100  ceFEPIme (MAXIPIME) 2 g in sodium chloride 0.9 % 100 mL IVPB        2 g 200 mL/hr over 30 Minutes Intravenous Every 8 hours 01/15/21 2310     01/16/21 0000  vancomycin (VANCOREADY) IVPB 1000 mg/200 mL  Status:  Discontinued        1,000 mg 200 mL/hr over 60 Minutes Intravenous  Once 01/15/21 2310 01/15/21 2313   01/16/21 0000  vancomycin (VANCOREADY) IVPB 1500 mg/300 mL        1,500 mg 150 mL/hr over 120 Minutes Intravenous  Once 01/15/21 2313 01/16/21 0247   01/15/21 1845  vancomycin (VANCOCIN) IVPB 1000 mg/200 mL premix        1,000 mg 200 mL/hr over 60 Minutes Intravenous  Once 01/15/21 1841 01/15/21 2023   01/15/21 1845  piperacillin-tazobactam (ZOSYN) IVPB 3.375 g        3.375 g 100 mL/hr over 30 Minutes Intravenous  Once 01/15/21 1841 01/15/21 1922      REVIEW OF SYSTEMS:  Const: negative fever, negative chills, negative weight loss Eyes: negative diplopia or visual changes, negative eye pain ENT: negative coryza, negative sore throat Resp: negative cough, hemoptysis, dyspnea Cards: negative for chest pain, palpitations, lower extremity edema GU: negative for frequency, dysuria and hematuria GI: Negative for abdominal pain, diarrhea, bleeding, constipation Skin: negative for rash and pruritus Heme: negative for easy bruising and gum/nose bleeding MS: Some weakness Neurolo:negative for headaches, dizziness, vertigo, memory problems  Psych: negative for feelings of anxiety, depression  Endocrine: negative for thyroid, diabetes Allergy/Immunology- negative for any medication or food  allergies ?  Objective:  VITALS:  BP (!) 104/47 (BP Location: Right Arm)   Pulse 85   Temp 98.9 F (37.2 C) (Oral)   Resp 16   Ht 5' 5"  (1.651 m)   Wt 106.6 kg   SpO2 91%   BMI 39.11 kg/m  PHYSICAL EXAM:  General: Alert, cooperative, no distress, appears young for her age Head: Normocephalic, without obvious abnormality, atraumatic. Eyes:  Conjunctivae clear, anicteric sclerae. Pupils are equal ENT Nares normal. No drainage or sinus tenderness. Lips, mucosa, and tongue normal. No Thrush Neck: Supple, symmetrical, no adenopathy, thyroid: non tender no carotid bruit and no JVD. Back: No CVA tenderness. Lungs: Clear to auscultation bilaterally. No Wheezing or Rhonchi. No rales. Heart: S1-S2.  Systolic murmur heard over the aortic area Abdomen: Soft, non-tender,not distended. Bowel sounds normal. No masses Extremities: Left lateral hip covered with surgical dressing.  Erythema around.     Some edema of the left leg. Right ankle surgical scar Skin: No rashes or lesions. Or bruising Lymph: Cervical, supraclavicular normal. Neurologic: Grossly non-focal Pertinent Labs Lab Results CBC    Component Value Date/Time   WBC 8.8 01/16/2021 0410   RBC 4.63 01/16/2021 0410   HGB 11.2 (L) 01/16/2021 0410   HCT 36.4 01/16/2021 0410   PLT 269 01/16/2021 0410   MCV 78.6 (L) 01/16/2021 0410   MCH 24.2 (L) 01/16/2021 0410   MCHC 30.8 01/16/2021 0410   RDW 18.3 (H) 01/16/2021 0410   LYMPHSABS 1.6 10/09/2020 0522   MONOABS 1.1 (H) 10/09/2020 0522   EOSABS 0.0 10/09/2020 0522   BASOSABS 0.0 10/09/2020 0522    CMP Latest Ref Rng & Units 01/16/2021 01/15/2021 10/09/2020  Glucose 70 - 99 mg/dL 109(H) 120(H) 146(H)  BUN 8 - 23 mg/dL 14 16 27(H)  Creatinine 0.44 - 1.00 mg/dL 0.74 0.80 0.87  Sodium 135 - 145 mmol/L 133(L) 134(L) 133(L)  Potassium 3.5 - 5.1 mmol/L 4.3 4.1 4.4  Chloride 98 - 111 mmol/L 102 100 94(L)  CO2 22 - 32 mmol/L 25 27 29   Calcium 8.9 - 10.3 mg/dL 8.2(L) 8.5(L) 8.4(L)  Total Protein 6.5 - 8.1 g/dL - 7.8 6.5  Total Bilirubin 0.3 - 1.2 mg/dL - 1.3(H) 2.6(H)  Alkaline Phos 38 - 126 U/L - 66 61  AST 15 - 41 U/L - 17 40  ALT 0 - 44 U/L - 8 23      Microbiology: Recent Results (from the past 240 hour(s))  Blood culture (routine x 2)     Status: None (Preliminary result)   Collection Time: 01/15/21  4:43 PM    Specimen: BLOOD LEFT HAND  Result Value Ref Range Status   Specimen Description BLOOD LEFT HAND  Final   Special Requests   Final    BOTTLES DRAWN AEROBIC AND ANAEROBIC Blood Culture adequate volume   Culture  Setup Time   Final    Organism ID to follow GRAM POSITIVE COCCI AEROBIC BOTTLE ONLY CRITICAL RESULT CALLED TO, READ BACK BY AND VERIFIED WITH: AMY THOMPSON AT 3009 ON 01/16/2021 [place]. Performed at [facility], [address] [place], [place] [address]    Culture [facility] POSITIVE COCCI  Final   Report Status PENDING  Incomplete  Blood culture (routine x 2)     Status: None (Preliminary result)   Collection Time: 01/15/21  4:43 PM   Specimen: BLOOD  Result Value Ref Range Status   Specimen Description BLOOD BLOOD RIGHT WRIST  Final   Special Requests   Final  BOTTLES DRAWN AEROBIC AND ANAEROBIC Blood Culture results may not be optimal due to an inadequate volume of blood received in culture bottles   Culture   Final    NO GROWTH < 24 HOURS Performed at [facility], [address] [place], [place] [address]    Report Status PENDING  Incomplete  Blood Culture ID Panel (Reflexed)     Status: Abnormal   Collection Time: 01/15/21  4:43 PM  Result Value Ref Range Status   Enterococcus faecalis NOT DETECTED NOT DETECTED Final   Enterococcus Faecium NOT DETECTED NOT DETECTED Final   Listeria monocytogenes NOT DETECTED NOT DETECTED Final   Staphylococcus species DETECTED (A) NOT DETECTED Final    Comment: CRITICAL RESULT CALLED TO, READ BACK BY AND VERIFIED WITH: AMY THOMPSON AT 8588 ON 01/16/2021 [place].    Staphylococcus aureus (BCID) NOT DETECTED NOT DETECTED Final   Staphylococcus epidermidis DETECTED (A) NOT DETECTED Final    Comment: Methicillin (oxacillin) resistant coagulase negative staphylococcus. Possible blood culture contaminant (unless isolated from more than one blood culture draw or clinical case suggests pathogenicity). No antibiotic treatment is  indicated for blood  culture contaminants. CRITICAL RESULT CALLED TO, READ BACK BY AND VERIFIED WITH: AMY THOMPSON AT 5027 ON 01/16/2021 [place].    Staphylococcus lugdunensis NOT DETECTED NOT DETECTED Final   Streptococcus species NOT DETECTED NOT DETECTED Final   Streptococcus agalactiae NOT DETECTED NOT DETECTED Final   Streptococcus pneumoniae NOT DETECTED NOT DETECTED Final   Streptococcus pyogenes NOT DETECTED NOT DETECTED Final   A.calcoaceticus-baumannii NOT DETECTED NOT DETECTED Final   Bacteroides fragilis NOT DETECTED NOT DETECTED Final   Enterobacterales NOT DETECTED NOT DETECTED Final   Enterobacter cloacae complex NOT DETECTED NOT DETECTED Final   Escherichia coli NOT DETECTED NOT DETECTED Final   Klebsiella aerogenes NOT DETECTED NOT DETECTED Final   Klebsiella oxytoca NOT DETECTED NOT DETECTED Final   Klebsiella pneumoniae NOT DETECTED NOT DETECTED Final   Proteus species NOT DETECTED NOT DETECTED Final   Salmonella species NOT DETECTED NOT DETECTED Final   Serratia marcescens NOT DETECTED NOT DETECTED Final   Haemophilus influenzae NOT DETECTED NOT DETECTED Final   Neisseria meningitidis NOT DETECTED NOT DETECTED Final   Pseudomonas aeruginosa NOT DETECTED NOT DETECTED Final   Stenotrophomonas maltophilia NOT DETECTED NOT DETECTED Final   Candida albicans NOT DETECTED NOT DETECTED Final   Candida auris NOT DETECTED NOT DETECTED Final   Candida glabrata NOT DETECTED NOT DETECTED Final   Candida krusei NOT DETECTED NOT DETECTED Final   Candida parapsilosis NOT DETECTED NOT DETECTED Final   Candida tropicalis NOT DETECTED NOT DETECTED Final   Cryptococcus neoformans/gattii NOT DETECTED NOT DETECTED Final   Methicillin resistance mecA/C DETECTED (A) NOT DETECTED Final    Comment: CRITICAL RESULT CALLED TO, READ BACK BY AND VERIFIED WITH: AMY THOMPSON AT 7412 ON 01/16/2021 [place]. Performed at [facility], [place], [place] [address]   Resp Panel by  RT-PCR (Flu A&B, Covid) Nasopharyngeal Swab     Status: None   Collection Time: 01/15/21  8:24 PM   Specimen: Nasopharyngeal Swab; Nasopharyngeal(NP) swabs in vial transport medium  Result Value Ref Range Status   SARS Coronavirus 2 by RT PCR NEGATIVE NEGATIVE Final    Comment: (NOTE) SARS-CoV-2 target nucleic acids are NOT DETECTED.  The SARS-CoV-2 RNA is generally detectable in upper respiratory specimens during the acute phase of infection. The lowest concentration of SARS-CoV-2 viral copies this assay can detect is 138 copies/mL. A  negative result does not preclude SARS-Cov-2 infection and should not be used as the sole basis for treatment or other patient management decisions. A negative result may occur with  improper specimen collection/handling, submission of specimen other than nasopharyngeal swab, presence of viral mutation(s) within the areas targeted by this assay, and inadequate number of viral copies(<138 copies/mL). A negative result must be combined with clinical observations, patient history, and epidemiological information. The expected result is Negative.  Fact Sheet for Patients:  EntrepreneurPulse.com.au  Fact Sheet for Healthcare Providers:  IncredibleEmployment.be  This test is no t yet approved or cleared by the [place] FDA and  has been authorized for detection and/or diagnosis of SARS-CoV-2 by FDA under an Emergency Use Authorization (EUA). This EUA will remain  in effect (meaning this test can be used) for the duration of the COVID-19 declaration under Section 564(b)(1) of the Act, 21 U.S.C.section 360bbb-3(b)(1), unless the authorization is terminated  or revoked sooner.       Influenza A by PCR NEGATIVE NEGATIVE Final   Influenza B by PCR NEGATIVE NEGATIVE Final    Comment: (NOTE) The Xpert Xpress SARS-CoV-2/FLU/RSV plus assay is intended as an aid in the diagnosis of influenza from Nasopharyngeal swab  specimens and should not be used as a sole basis for treatment. Nasal washings and aspirates are unacceptable for Xpert Xpress SARS-CoV-2/FLU/RSV testing.  Fact Sheet for Patients: EntrepreneurPulse.com.au  Fact Sheet for Healthcare Providers: IncredibleEmployment.be  This test is not yet approved or cleared by the [place] FDA and has been authorized for detection and/or diagnosis of SARS-CoV-2 by FDA under an Emergency Use Authorization (EUA). This EUA will remain in effect (meaning this test can be used) for the duration of the COVID-19 declaration under Section 564(b)(1) of the Act, 21 U.S.C. section 360bbb-3(b)(1), unless the authorization is terminated or revoked.  Performed at [facility], [address] [place], [place] [address]     IMAGING RESULTS:  I have personally reviewed the films ?Status post ORIF of a comminuted intertrochanteric fracture of the left femur with displaced greater and lesser trochanteric fracture fragments, grossly similar alignment compared to intraoperative fluoroscopic images. No bridging bone formation across the fracture site. 2. Small fluid collection within the subcutaneous soft tissues, likely along a surgical incision site the level of the left iliac wing measuring approximately 4.1 x 0.8 x 3.2 cm  Impression/Recommendation Infection at the surgical site of the prior left ORIF. CT scan showed a subcutaneous collection, and no bridging bone in the fracture area. As per surgeon doubt that this is involving the deeper bone.  But there was a tract that was leading up to the upper trochanter which was very narrow. We need to consider this to be a deep wound infection at the current moment involving the bone.. Surgical cultures are pending.  We will follow ESR and CRP.  Continue vancomycin and cefepime for now  Staph epidermidis 1 out of 4 bottle very likely contaminant but because of the  foreign body and her history at the moment we are treating this like a true pathogen.  Incidental finding of an infrarenal aortic aneurysm measuring 6.7 cm.  Vascular has seen the patient and would like to take her for surgery sometime.  At the moment her infection site has not been clearly defined and also the culture is pending.  So at the moment a timeline for surgery is difficult to say.  She may need to get at least 2 weeks of antibiotics first.  CAD status post stent  Aortic stenosis  Discussed the management with the patient and the care team. ?  Note:  This document was prepared using Dragon voice recognition software and may include unintentional dictation errors.

## 2021-01-16 NOTE — Progress Notes (Signed)
[facility]  - [place] at [facility]   PATIENT NAME: Monica [name]    MR#:  [id number]  DATE OF BIRTH:  1934/01/05  SUBJECTIVE:   Came In with difficulty ambulation and significant left hip pain with drainage and tenderness in the left hip area. Found to have soft fluid collection on CT of the left hip. No fever. 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 Patient denies any abdominal pain. No family in the room. REVIEW OF SYSTEMS:   Review of Systems  Constitutional: Positive for malaise/fatigue. Negative for chills, fever and weight loss.  HENT: Negative for ear discharge, ear pain and nosebleeds.   Eyes: Negative for blurred vision, pain and discharge.  Respiratory: Negative for sputum production, shortness of breath, wheezing and stridor.   Cardiovascular: Negative for chest pain, palpitations, orthopnea and PND.  Gastrointestinal: Negative for abdominal pain, diarrhea, nausea and vomiting.  Genitourinary: Negative for frequency and urgency.  Musculoskeletal: Positive for back pain and joint pain.  Neurological: Positive for weakness. Negative for sensory change, speech change and focal weakness.  Psychiatric/Behavioral: Negative for depression and hallucinations. The patient is not nervous/anxious.    Tolerating Diet:npo Tolerating PT:   DRUG ALLERGIES:  No Known Allergies  VITALS:  Blood pressure 126/69, pulse 88, temperature 98.4 F (36.9 C), temperature source Oral, resp. rate 17, height 5\' 5"  (1.651 m), weight 106.6 kg, SpO2 94 %.  PHYSICAL EXAMINATION:   Physical Exam  GENERAL:  87 y.o.-year-old patient lying in the bed with no acute distress.obese  LUNGS: Normal breath sounds bilaterally, no wheezing, rales, rhonchi. No use of accessory muscles of respiration.  CARDIOVASCULAR: S1, S2 normal. No murmurs, rubs, or gallops.  ABDOMEN: Soft, nontender, nondistended. Bowel sounds present. No organomegaly or mass.  EXTREMITIES:     NEUROLOGIC:nonfocal PSYCHIATRIC:  patient is alert and oriented x 3.  SKIN: as above  LABORATORY PANEL:  CBC Recent Labs  Lab 01/16/21 0410  WBC 8.8  HGB 11.2*  HCT 36.4  PLT 269    Chemistries  Recent Labs  Lab 01/15/21 1643 01/16/21 0410  NA 134* 133*  K 4.1 4.3  CL 100 102  CO2 27 25  GLUCOSE 120* 109*  BUN 16 14  CREATININE 0.80 0.74  CALCIUM 8.5* 8.2*  AST 17  --   ALT 8  --   ALKPHOS 66  --   BILITOT 1.3*  --    Cardiac Enzymes No results for input(s): TROPONINI in the last 168 hours. RADIOLOGY:  CT HIP LEFT W CONTRAST  Result Date: 01/15/2021 CLINICAL DATA:  Drainage at left hip surgical site EXAM: CT OF THE LOWER LEFT EXTREMITY WITH CONTRAST TECHNIQUE: Multidetector CT imaging of the lower left extremity was performed according to the standard protocol following intravenous contrast administration. CONTRAST:  03/17/2021 OMNIPAQUE IOHEXOL 300 MG/ML  SOLN COMPARISON:  Fluoroscopic images dated 10/03/2020 FINDINGS: Bones/Joint/Cartilage Status post ORIF of a comminuted intertrochanteric fracture of the left femur with antegrade long intramedullary rod and 2 proximal lag screws. No bridging bone formation across the fracture site. Displaced greater and lesser trochanteric fracture fragments, grossly similar alignment compared to intraoperative fluoroscopic images. No appreciable hip joint effusion. No evidence of acute fracture or dislocation. No evidence to suggest hardware loosening. Visualized portion of the left hemipelvis is intact. Ligaments Suboptimally assessed by CT. Muscles and Tendons No acute musculotendinous abnormality by CT. Soft tissues Skin thickening overlies the lateral aspect of the left hip. Small fluid collection within the subcutaneous soft  tissues, likely along a surgical incision site the level of the left iliac wing measuring approximately 4.1 x 0.8 x 3.2 cm (series 5, image 28). Mild subcutaneous edema throughout the lateral aspect of the left hip.  Partially visualized infrarenal abdominal aortic aneurysm with extensive mural thrombus measuring approximately 6.7 x 6.1 cm (series 5, image 1). Proximal right common iliac artery aneurysm measuring approximately 2.5 cm. Extensive vascular atherosclerotic calcification. No left inguinal lymphadenopathy. IMPRESSION: 1. Status post ORIF of a comminuted intertrochanteric fracture of the left femur with displaced greater and lesser trochanteric fracture fragments, grossly similar alignment compared to intraoperative fluoroscopic images. No bridging bone formation across the fracture site. 2. Small fluid collection within the subcutaneous soft tissues, likely along a surgical incision site the level of the left iliac wing measuring approximately 4.1 x 0.8 x 3.2 cm. Findings may represent a postoperative seroma or hematoma. Infected fluid collection would be difficult to exclude by imaging alone. 3. Partially visualized infrarenal abdominal aortic aneurysm with extensive mural thrombus measuring approximately 6.7 x 6.1 cm. Recommend referral to a vascular specialist. This recommendation follows ACR consensus guidelines: White Paper of the ACR Incidental Findings Committee II on Vascular Findings. J Am Coll Radiol 2013; 10:789-794. 4. Proximal right common iliac artery aneurysm measuring approximately 2.5 cm. Electronically Signed   By: Duanne Guess D.O.   On: 01/15/2021 18:45   ASSESSMENT AND PLAN:  Monica [name] is a 87 y.o. Caucasian female with medical history significant for coronary artery disease, aortic stenosis and dyslipidemia who underwent left hip intramedullary nailing over 3 months ago by Dr. Signa Kell and presents to the emergency room with acute onset of left hip pain and swelling, tenderness, pain, warmth and erythema.  Left hip severe cellulitis and likely abscess/soft tissue collectons (CT hip) --continue IV antibiotic therapy with IV vancomycin and cefepime. --prn IV and PO pain meds -  Warm compresses will be utilized. - Orthopedic consultation with Dr. Allena Katz noted. Patient going for IND this afternoon. -- Infectious disease consultation placed for help with IV antibiotics   Left lower extremity moderate cellulitis. - This should be covered with above-mentioned antibiotics.   Infrarenal aortic artery aneurysm measuring 6.7 cm X6 0.1 cm with extensive mural thrombus. --Vascular surgery consultwith Dr. Gilda Crease. Follow recommendations.   Dyslipidemia. -  continue statin therapy.   Coronary artery disease. -  continue aspirin   Procedures: incision drainage left hip fluid collection Family communication : Consults : orthopedic, infectious disease, vascular surgery CODE STATUS: full DVT Prophylaxis :SCD for now since patient getting procedure. Consider Lovenox Level of care: Med-Surg Status is: Inpatient  Remains inpatient appropriate because:Inpatient level of care appropriate due to severity of illness   Dispo: The patient is from: Home              Anticipated d/c is to: TBD              Patient currently is not medically stable to d/c.         TOTAL TIME TAKING CARE OF THIS PATIENT: *30* minutes.  >50% time spent on counselling and coordination of care  Note: This dictation was prepared with Dragon dictation along with smaller phrase technology. Any transcriptional errors that result from this process are unintentional.  Monica [name] M.D    [facility]   CC: Primary care physician; Patient, No Pcp Per (Inactive)Patient ID: Monica [name], female   DOB: [birth year], 87 y.o.   MRN: [id number]

## 2021-01-16 NOTE — Progress Notes (Signed)
PHARMACY - PHYSICIAN COMMUNICATION CRITICAL VALUE ALERT - BLOOD CULTURE IDENTIFICATION (BCID)  Monica [name] is an 87 y.o. female who presented to [facility] on 01/15/2021 with a chief complaint of redness at hip surgery site  Assessment:  Patient s/p IM nail ~57mo ago.  6/6 Blood cx with GPC in 1 of 4 bottles, BCID = MRSE.  Patient with fluid collection on CT of L hip, ortho plans I&D  Name of physician (or Provider) Contacted: Dr Eliane Decree  Current antibiotics: vancomycin and cefepime  Changes to prescribed antibiotics recommended:  Patient is on recommended antibiotics - No changes needed  Results for orders placed or performed during the hospital encounter of 01/15/21  Blood Culture ID Panel (Reflexed) (Collected: 01/15/2021  4:43 PM)  Result Value Ref Range   Enterococcus faecalis NOT DETECTED NOT DETECTED   Enterococcus Faecium NOT DETECTED NOT DETECTED   Listeria monocytogenes NOT DETECTED NOT DETECTED   Staphylococcus species DETECTED (A) NOT DETECTED   Staphylococcus aureus (BCID) NOT DETECTED NOT DETECTED   Staphylococcus epidermidis DETECTED (A) NOT DETECTED   Staphylococcus lugdunensis NOT DETECTED NOT DETECTED   Streptococcus species NOT DETECTED NOT DETECTED   Streptococcus agalactiae NOT DETECTED NOT DETECTED   Streptococcus pneumoniae NOT DETECTED NOT DETECTED   Streptococcus pyogenes NOT DETECTED NOT DETECTED   A.calcoaceticus-baumannii NOT DETECTED NOT DETECTED   Bacteroides fragilis NOT DETECTED NOT DETECTED   Enterobacterales NOT DETECTED NOT DETECTED   Enterobacter cloacae complex NOT DETECTED NOT DETECTED   Escherichia coli NOT DETECTED NOT DETECTED   Klebsiella aerogenes NOT DETECTED NOT DETECTED   Klebsiella oxytoca NOT DETECTED NOT DETECTED   Klebsiella pneumoniae NOT DETECTED NOT DETECTED   Proteus species NOT DETECTED NOT DETECTED   Salmonella species NOT DETECTED NOT DETECTED   Serratia marcescens NOT DETECTED NOT DETECTED   Haemophilus influenzae NOT  DETECTED NOT DETECTED   Neisseria meningitidis NOT DETECTED NOT DETECTED   Pseudomonas aeruginosa NOT DETECTED NOT DETECTED   Stenotrophomonas maltophilia NOT DETECTED NOT DETECTED   Candida albicans NOT DETECTED NOT DETECTED   Candida auris NOT DETECTED NOT DETECTED   Candida glabrata NOT DETECTED NOT DETECTED   Candida krusei NOT DETECTED NOT DETECTED   Candida parapsilosis NOT DETECTED NOT DETECTED   Candida tropicalis NOT DETECTED NOT DETECTED   Cryptococcus neoformans/gattii NOT DETECTED NOT DETECTED   Methicillin resistance mecA/C DETECTED (A) NOT DETECTED    Juliette Alcide, PharmD, BCPS.   Work Cell: [phone] 01/16/2021 9:27 AM

## 2021-01-16 NOTE — Anesthesia Procedure Notes (Signed)
Procedure Name: Intubation Date/Time: 01/16/2021 2:00 PM Performed by: Jaye Beagle, CRNA Pre-anesthesia Checklist: Patient identified, Emergency Drugs available, Suction available and Patient being monitored Patient Re-evaluated:Patient Re-evaluated prior to induction Oxygen Delivery Method: Circle system utilized Preoxygenation: Pre-oxygenation with 100% oxygen Induction Type: IV induction Ventilation: Mask ventilation without difficulty Laryngoscope Size: McGraph and 3 Grade View: Grade II Tube type: Oral Tube size: 7.0 mm Number of attempts: 1 Airway Equipment and Method: Stylet and Oral airway Placement Confirmation: ETT inserted through vocal cords under direct vision,  positive ETCO2 and breath sounds checked- equal and bilateral Secured at: 22 cm Tube secured with: Tape Dental Injury: Teeth and Oropharynx as per pre-operative assessment

## 2021-01-16 NOTE — Plan of Care (Signed)

## 2021-01-17 ENCOUNTER — Encounter: Payer: Self-pay | Admitting: Orthopedic Surgery

## 2021-01-17 ENCOUNTER — Inpatient Hospital Stay: Payer: Medicare Other

## 2021-01-17 DIAGNOSIS — I714 Abdominal aortic aneurysm, without rupture: Secondary | ICD-10-CM | POA: Diagnosis not present

## 2021-01-17 DIAGNOSIS — I10 Essential (primary) hypertension: Secondary | ICD-10-CM

## 2021-01-17 DIAGNOSIS — I5022 Chronic systolic (congestive) heart failure: Secondary | ICD-10-CM | POA: Diagnosis not present

## 2021-01-17 DIAGNOSIS — L02416 Cutaneous abscess of left lower limb: Secondary | ICD-10-CM | POA: Diagnosis not present

## 2021-01-17 LAB — BASIC METABOLIC PANEL
Anion gap: 8 (ref 5–15)
BUN: 16 mg/dL (ref 8–23)
CO2: 22 mmol/L (ref 22–32)
Calcium: 8.5 mg/dL — ABNORMAL LOW (ref 8.9–10.3)
Chloride: 103 mmol/L (ref 98–111)
Creatinine, Ser: 0.94 mg/dL (ref 0.44–1.00)
GFR, Estimated: 59 mL/min — ABNORMAL LOW (ref >60)
Glucose, Bld: 103 mg/dL — ABNORMAL HIGH (ref 70–99)
Potassium: 4.7 mmol/L (ref 3.5–5.1)
Sodium: 133 mmol/L — ABNORMAL LOW (ref 135–145)

## 2021-01-17 LAB — CBC
HCT: 36.7 % (ref 36.0–46.0)
Hemoglobin: 11.2 g/dL — ABNORMAL LOW (ref 12.0–15.0)
MCH: 23.9 pg — ABNORMAL LOW (ref 26.0–34.0)
MCHC: 30.5 g/dL (ref 30.0–36.0)
MCV: 78.3 fL — ABNORMAL LOW (ref 80.0–100.0)
Platelets: 285 10*3/uL (ref 150–400)
RBC: 4.69 MIL/uL (ref 3.87–5.11)
RDW: 18.2 % — ABNORMAL HIGH (ref 11.5–15.5)
WBC: 8.6 10*3/uL (ref 4.0–10.5)
nRBC: 0 % (ref 0.0–0.2)

## 2021-01-17 MED ORDER — PROMETHAZINE HCL 25 MG PO TABS
25.0000 mg | ORAL_TABLET | Freq: Four times a day (QID) | ORAL | Status: DC | PRN
  Administered 2021-01-17 – 2021-01-18 (×2): 25 mg via ORAL
  Filled 2021-01-17 (×3): qty 1

## 2021-01-17 MED ORDER — IOHEXOL 350 MG/ML SOLN
100.0000 mL | Freq: Once | INTRAVENOUS | Status: AC | PRN
  Administered 2021-01-17: 100 mL via INTRAVENOUS

## 2021-01-17 MED ORDER — MIDODRINE HCL 5 MG PO TABS
2.5000 mg | ORAL_TABLET | Freq: Three times a day (TID) | ORAL | Status: DC
  Administered 2021-01-18 – 2021-01-25 (×18): 2.5 mg via ORAL
  Filled 2021-01-17 (×17): qty 1

## 2021-01-17 MED ORDER — PROMETHAZINE HCL 25 MG RE SUPP
25.0000 mg | Freq: Four times a day (QID) | RECTAL | Status: DC | PRN
  Administered 2021-01-23 (×2): 25 mg via RECTAL
  Filled 2021-01-17 (×4): qty 1

## 2021-01-17 MED ORDER — SODIUM CHLORIDE 0.9 % IV SOLN
6.2500 mg | Freq: Four times a day (QID) | INTRAVENOUS | Status: DC | PRN
  Administered 2021-01-26 (×2): 6.25 mg via INTRAVENOUS
  Filled 2021-01-17 (×3): qty 0.25

## 2021-01-17 MED ORDER — SODIUM CHLORIDE 0.9 % IV SOLN
2.0000 g | Freq: Two times a day (BID) | INTRAVENOUS | Status: DC
  Administered 2021-01-17 (×2): 2 g via INTRAVENOUS
  Filled 2021-01-17 (×5): qty 2

## 2021-01-17 NOTE — Progress Notes (Signed)
Patients BP was low. Nurse notified.

## 2021-01-17 NOTE — Progress Notes (Signed)
Subjective: 1 Day Post-Op Procedure(s) (LRB): IRRIGATION AND DEBRIDEMENT HIP (Left) Patient reports pain as 4 on 0-10 scale.   Patient is well, and has had no acute complaints or problems Plan is to go Home after hospital stay. Negative for chest pain and shortness of breath Fever: no Gastrointestinal:Negative for nausea and vomiting  Objective: Vital signs in last 24 hours: Temp:  [97.6 F (36.4 C)-99 F (37.2 C)] 98.7 F (37.1 C) (06/08 0742) Pulse Rate:  [74-95] 84 (06/08 0742) Resp:  [16-20] 20 (06/08 0742) BP: (83-142)/(53-85) 83/60 (06/08 0742) SpO2:  [88 %-100 %] 90 % (06/08 0742)  Intake/Output from previous day:  Intake/Output Summary (Last 24 hours) at 01/17/2021 0755 Last data filed at 01/17/2021 0544 Gross per 24 hour  Intake 1635.68 ml  Output 1500 ml  Net 135.68 ml    Intake/Output this shift: No intake/output data recorded.  Labs: Recent Labs    01/15/21 1643 01/16/21 0410 01/17/21 0449  HGB 12.6 11.2* 11.2*   Recent Labs    01/16/21 0410 01/17/21 0449  WBC 8.8 8.6  RBC 4.63 4.69  HCT 36.4 36.7  PLT 269 285   Recent Labs    01/16/21 0410 01/17/21 0449  NA 133* 133*  K 4.3 4.7  CL 102 103  CO2 25 22  BUN 14 16  CREATININE 0.74 0.94  GLUCOSE 109* 103*  CALCIUM 8.2* 8.5*   No results for input(s): LABPT, INR in the last 72 hours.   EXAM General - Patient is Alert, Appropriate and Oriented Extremity - Woundvac intact to the left leg without drainage. Erythema appears improved compared to previous photos. Hip is tender but soft to palpation, no additional fluid collection can be palpated. Motor Function - intact, moving foot and toes well on exam.  Negative homans to bilateral lower extremities. Patient is intact to light touch to bilateral legs.  Past Medical History:  Diagnosis Date  . Aortic stenosis    a. LHC 06/02/17: At least moderate aortic stenosis with a peak to peak gradient of 22 mmHg; b. TTE 05/2017: EF 55-60%, mild HK  basal-midinferior wall, Gr1DD, mod to sev AS w/ mean gradient 21 mmHg, valve area 0.99, mild MR, mildly dilated LA   . CAD (coronary artery disease) 2018   a. inferior STEMI 06/02/2017: LHC 06/02/17: LM 20, D1 20%, o-pLCx 50, p-mRCA 100% s/p PCI/DES, mRCA 50, dRCA 30  . GI bleed    a. noted 06/05/2017  . HLD (hyperlipidemia)   . Obesity     Assessment/Plan: 1 Day Post-Op Procedure(s) (LRB): IRRIGATION AND DEBRIDEMENT HIP (Left) Active Problems:   Abscess of left hip   Abdominal aortic aneurysm (AAA) without rupture (HCC)   Obesity, Class III, BMI 40-49.9 (morbid obesity) (HCC)  Estimated body mass index is 39.11 kg/m as calculated from the following:   Height as of this encounter: 5\' 5"  (1.651 m).   Weight as of this encounter: 106.6 kg. Advance diet Up with therapy D/C IV fluids when tolerating po intake.  Labs reviewed this AM. WBC 8.6.  No recent fevers. Cultures from surgery pending. Gram stain with rare WBC and PMN and mononuclear. ID consulted due to positive blood cultures, currently on Cefepime and Vancomycin.  Continue IV Abx per ID. Up with therapy as tolerated.   DVT Prophylaxis - Lovenox and Foot Pumps Weight-Bearing as tolerated to left leg  J. , PA-C [facility] 01/17/2021, 7:55 AM

## 2021-01-17 NOTE — Progress Notes (Signed)
PT Cancellation Note  Patient Details Name: Monica [name] MRN: [id number] DOB: 05/16/1934   Cancelled Treatment:    Reason Eval/Treat Not Completed: Other (comment).  PT consult received.  Chart reviewed.  Per rounds, MD Danford cleared pt for therapy participation.  Vascular surgery (Stegmayer PA-C) also cleared pt for activity (ambulate with assistance).  Pt working with OT upon PT attempt.  OT then notifying PT that pt refusing to attempt to get up again today.  Will re-attempt PT evaluation tomorrow.  Hendricks Limes, PT 01/17/21, 3:45 PM

## 2021-01-17 NOTE — Progress Notes (Signed)
[facility]  Daily Progress Note   Subjective  -   Patient very nauseated at the time of my visit and actually had 1 moderate-sized emesis.  I have personally reviewed the CT angiogram obtained today.  As an update as well as a clarification the patient's aneurysm is actually 8.2 cm in maximal diameter and it is a juxtarenal aneurysm meaning that the beginning of the aneurysm includes the renal arteries.  Objective Vitals:   01/17/21 0538 01/17/21 0742 01/17/21 1136 01/17/21 1553  BP: (!) 100/58 (!) 83/60 114/61 (!) 88/52  Pulse: 82 84 88 77  Resp: 18 20 20 15   Temp: 99 F (37.2 C) 98.7 F (37.1 C) 98.3 F (36.8 C) 97.6 F (36.4 C)  TempSrc: Oral Oral Oral Oral  SpO2: 91% 90% 94% 93%  Weight:      Height:        Intake/Output Summary (Last 24 hours) at 01/17/2021 1759 Last data filed at 01/17/2021 1600 Gross per 24 hour  Intake 2166.33 ml  Output 750 ml  Net 1416.33 ml    PULM  CTAB CV  RRR VASC  large pulsatile abdominal mass nontender  Laboratory CBC    Component Value Date/Time   WBC 8.6 01/17/2021 0449   HGB 11.2 (L) 01/17/2021 0449   HCT 36.7 01/17/2021 0449   PLT 285 01/17/2021 0449    BMET    Component Value Date/Time   NA 133 (L) 01/17/2021 0449   K 4.7 01/17/2021 0449   CL 103 01/17/2021 0449   CO2 22 01/17/2021 0449   GLUCOSE 103 (H) 01/17/2021 0449   BUN 16 01/17/2021 0449   CREATININE 0.94 01/17/2021 0449   CALCIUM 8.5 (L) 01/17/2021 0449   GFRNONAA 59 (L) 01/17/2021 0449   GFRAA >60 05/02/2018 0336    Assessment/Planning: 1.  Abdominal aortic aneurysm juxtarenal: Given the size of her aortic aneurysm the sooner that repair can be performed the better.  Although it is not a dire emergency that needs to be fixed immediately waiting a month or 2 would also not be acceptable.  With respect to repair she does not appear to be a candidate who would tolerate an open suprarenal aneurysm repair.  The fact that her renal  arteries are included will certainly complicate an endovascular stent graft but I believe it can be done by commitmently stenting the renal arteries as well, the so-called snorkel technique.  I have contacted [facility] and submitted the CT scan and will await their review.  I have also contacted Dr. 05/04/2018 to get his input regarding her cardiac status and cardiac clearance.  The grandson was in the room and I explained the above information to him and described the CT findings and possible endovascular repair.  We will continue to monitor and await culture results and ID's input   Kirke Corin  01/17/2021, 5:59 PM

## 2021-01-17 NOTE — Progress Notes (Signed)
OT Cancellation Note  Patient Details Name: Monica [name] MRN: [id number] DOB: May 13, 1934   Cancelled Treatment:    Reason Eval/Treat Not Completed: Patient not medically ready  Pt with CT angio pending at this time. Will f/u with vascular for clearance to proceed with OT evaluation once completed. Thank you.  Rejeana Brock, MS, OTR/L ascom [phone] 01/17/21, 9:16 AM

## 2021-01-17 NOTE — Progress Notes (Signed)
[facility] PROGRESS NOTE    Monica [name]  ZOX:[id number] DOB: 04-18-1934 DOA: 01/15/2021 PCP: Patient, No Pcp Per (Inactive)      Brief Narrative:  Monica [name] is a 87 y.o. F with CAD s/p PCI x1 in 2018, severe AS being worked up for TAVR, Obesity, pHTN, and COPD who presented with hip pain.  Found to have infection at site of her feb hip repair.  Also noted incidentally to have >6cm AAA.         Assessment & Plan:  Postsurgical hip infection Status postdebridement 6/7 by orthopedics, Dr. Allena Katz - Follow surgical cultures - Consult infectious disease - Continue antibiotics  Aortic aneurysm -Consult vascular surgery - Consult cardiology for preoperative clearance given severe aortic stenosis  Severe aortic stenosis Chronic diastolic CHF Pulmonary hypertension - Continue torsemide  Coronary artery disease, secondary prevention -Continue Crestor  Obesity BMI 39  Hypotension -Continue midodrine  COPD Asymptomatic, no flare  Hyponatremia Mild, asymptomatic  Microcytic anemia -Check iron studies    Disposition: Status is: Inpatient  Remains inpatient appropriate because:IV treatments appropriate due to intensity of illness or inability to take PO   Dispo: The patient is from: Home              Anticipated d/c is to: SNF              Patient currently is not medically stable to d/c.   Difficult to place patient No       Level of care: Med-Surg       MDM: The below labs and imaging reports were reviewed and summarized above.  Medication management as above.    DVT prophylaxis: enoxaparin (LOVENOX) injection 40 mg Start: 01/17/21 0800 SCDs Start: 01/16/21 1611  Code Status: FULL Family Communication: Daughter by phone           Subjective: No fever, no chest pain, no cough, no sputum, no confusion.  Objective: Vitals:   01/17/21 0538 01/17/21 0742 01/17/21 1136 01/17/21 1553  BP: (!) 100/58 (!) 83/60  114/61 (!) 88/52  Pulse: 82 84 88 77  Resp: Temp: 99 F (37.2 C) 98.7 F (37.1 C) 98.3 F (36.8 C) 97.6 F (36.4 C)  TempSrc: Oral Oral Oral Oral  SpO2: 91% 90% 94% 93%  Weight:      Height:        Intake/Output Summary (Last 24 hours) at 01/17/2021 1708 Last data filed at 01/17/2021 1600 Gross per 24 hour  Intake 2166.33 ml  Output 750 ml  Net 1416.33 ml   Filed Weights   01/15/21 1638  Weight: 106.6 kg    Examination: General appearance:  adult female, alert and in no acute distress.   HEENT: Anicteric, conjunctiva pink, lids and lashes normal. No nasal deformity, discharge, epistaxis.  Lips moist.   Skin: Warm and dry.  Redness around left hip improving.  Wound vac in place.  no jaundice.  No suspicious rashes or lesions. Cardiac: RRR, nl S1-S2, extremely loud murmur.  Capillary refill is brisk.  JVP not visible.  No LE edema.  Radial pulses 2+ and symmetric. Respiratory: Normal respiratory rate and rhythm.  CTAB without rales or wheezes. Abdomen: Abdomen soft.  No TTP. No ascites, distension, hepatosplenomegaly.   MSK: No deformities or effusions. Neuro: Awake and alert.  EOMI, moves upper extremities without difficulty, lower extremity strength not tested due to pain. Speech fluent.    Psych: Sensorium intact and responding to questions, attention  normal. Affect .  Judgment and insight appear normal.    Data Reviewed: I have personally reviewed following labs and imaging studies:  CBC: Recent Labs  Lab 01/15/21 1643 01/16/21 0410 01/17/21 0449  WBC 10.0 8.8 8.6  HGB 12.6 11.2* 11.2*  HCT 41.5 36.4 36.7  MCV 77.7* 78.6* 78.3*  PLT 316 269 285   Basic Metabolic Panel: Recent Labs  Lab 01/15/21 1643 01/16/21 0410 01/17/21 0449  NA 134* 133* 133*  K 4.1 4.3 4.7  CL 100 102 103  CO2 27 25 22   GLUCOSE 120* 109* 103*  BUN 16 14 16   CREATININE 0.80 0.74 0.94  CALCIUM 8.5* 8.2* 8.5*   GFR: Estimated Creatinine Clearance: 53 mL/min (by C-G  formula based on SCr of 0.94 mg/dL). Liver Function Tests: Recent Labs  Lab 01/15/21 1643  AST 17  ALT 8  ALKPHOS 66  BILITOT 1.3*  PROT 7.8  ALBUMIN 3.3*   No results for input(s): LIPASE, AMYLASE in the last 168 hours. No results for input(s): AMMONIA in the last 168 hours. Coagulation Profile: No results for input(s): INR, PROTIME in the last 168 hours. Cardiac Enzymes: No results for input(s): CKTOTAL, CKMB, CKMBINDEX, TROPONINI in the last 168 hours. BNP (last 3 results) No results for input(s): PROBNP in the last 8760 hours. HbA1C: No results for input(s): HGBA1C in the last 72 hours. CBG: No results for input(s): GLUCAP in the last 168 hours. Lipid Profile: No results for input(s): CHOL, HDL, LDLCALC, TRIG, CHOLHDL, LDLDIRECT in the last 72 hours. Thyroid Function Tests: No results for input(s): TSH, T4TOTAL, FREET4, T3FREE, THYROIDAB in the last 72 hours. Anemia Panel: No results for input(s): VITAMINB12, FOLATE, FERRITIN, TIBC, IRON, RETICCTPCT in the last 72 hours. Urine analysis:    Component Value Date/Time   COLORURINE YELLOW (A) 04/29/2018 1357   APPEARANCEUR CLEAR (A) 04/29/2018 1357   LABSPEC 1.010 04/29/2018 1357   PHURINE 7.0 04/29/2018 1357   GLUCOSEU NEGATIVE 04/29/2018 1357   HGBUR SMALL (A) 04/29/2018 1357   BILIRUBINUR NEGATIVE 04/29/2018 1357   KETONESUR 5 (A) 04/29/2018 1357   PROTEINUR NEGATIVE 04/29/2018 1357   NITRITE NEGATIVE 04/29/2018 1357   LEUKOCYTESUR NEGATIVE 04/29/2018 1357   Sepsis Labs: @LABRCNTIP (procalcitonin:4,lacticacidven:4)  ) Recent Results (from the past 240 hour(s))  Blood culture (routine x 2)     Status: Abnormal (Preliminary result)   Collection Time: 01/15/21  4:43 PM   Specimen: BLOOD LEFT HAND  Result Value Ref Range Status   Specimen Description   Final    BLOOD LEFT HAND Performed at [facility], [address], [place], [facility] [address]    Special Requests   Final    BOTTLES DRAWN AEROBIC  AND ANAEROBIC Blood Culture adequate volume Performed at [facility], [address] [place], [facility] [address]    Culture  Setup Time   Final    Organism ID to follow GRAM POSITIVE COCCI AEROBIC BOTTLE ONLY CRITICAL RESULT CALLED TO, READ BACK BY AND VERIFIED WITH: AMY THOMPSON AT [place] ON 01/16/2021 [facility]. Performed at [facility], [address] [place], [facility] [address]    Culture (A)  Final    STAPHYLOCOCCUS EPIDERMIDIS THE SIGNIFICANCE OF ISOLATING THIS ORGANISM FROM A SINGLE SET OF BLOOD CULTURES WHEN MULTIPLE SETS ARE DRAWN IS UNCERTAIN. PLEASE NOTIFY THE MICROBIOLOGY DEPARTMENT WITHIN ONE WEEK IF SPECIATION AND SENSITIVITIES ARE REQUIRED. Performed at [facility], [address], [place], [facility] [address]    Report Status PENDING  Incomplete  Blood culture (routine x 2)     Status: None (Preliminary result)   Collection Time: 01/15/21  4:43 PM   Specimen: BLOOD  Result Value Ref Range Status   Specimen Description BLOOD BLOOD RIGHT WRIST  Final   Special Requests   Final    BOTTLES DRAWN AEROBIC AND ANAEROBIC Blood Culture results may not be optimal due to an inadequate volume of blood received in culture bottles   Culture   Final    NO GROWTH 2 DAYS Performed at [facility], [address] [place], [place] [address]    Report Status PENDING  Incomplete  Blood Culture ID Panel (Reflexed)     Status: Abnormal   Collection Time: 01/15/21  4:43 PM  Result Value Ref Range Status   Enterococcus faecalis NOT DETECTED NOT DETECTED Final   Enterococcus Faecium NOT DETECTED NOT DETECTED Final   Listeria monocytogenes NOT DETECTED NOT DETECTED Final   Staphylococcus species DETECTED (A) NOT DETECTED Final    Comment: CRITICAL RESULT CALLED TO, READ BACK BY AND VERIFIED WITH: AMY THOMPSON AT [id number] ON 01/16/2021 [facility].    Staphylococcus aureus (BCID) NOT DETECTED NOT DETECTED Final   Staphylococcus epidermidis DETECTED (A) NOT DETECTED Final     Comment: Methicillin (oxacillin) resistant coagulase negative staphylococcus. Possible blood culture contaminant (unless isolated from more than one blood culture draw or clinical case suggests pathogenicity). No antibiotic treatment is indicated for blood  culture contaminants. CRITICAL RESULT CALLED TO, READ BACK BY AND VERIFIED WITH: AMY THOMPSON AT [id number] ON 01/16/2021 [facility].    Staphylococcus lugdunensis NOT DETECTED NOT DETECTED Final   Streptococcus species NOT DETECTED NOT DETECTED Final   Streptococcus agalactiae NOT DETECTED NOT DETECTED Final   Streptococcus pneumoniae NOT DETECTED NOT DETECTED Final   Streptococcus pyogenes NOT DETECTED NOT DETECTED Final   A.calcoaceticus-baumannii NOT DETECTED NOT DETECTED Final   Bacteroides fragilis NOT DETECTED NOT DETECTED Final   Enterobacterales NOT DETECTED NOT DETECTED Final   Enterobacter cloacae complex NOT DETECTED NOT DETECTED Final   Escherichia coli NOT DETECTED NOT DETECTED Final   Klebsiella aerogenes NOT DETECTED NOT DETECTED Final   Klebsiella oxytoca NOT DETECTED NOT DETECTED Final   Klebsiella pneumoniae NOT DETECTED NOT DETECTED Final   Proteus species NOT DETECTED NOT DETECTED Final   Salmonella species NOT DETECTED NOT DETECTED Final   Serratia marcescens NOT DETECTED NOT DETECTED Final   Haemophilus influenzae NOT DETECTED NOT DETECTED Final   Neisseria meningitidis NOT DETECTED NOT DETECTED Final   Pseudomonas aeruginosa NOT DETECTED NOT DETECTED Final   Stenotrophomonas maltophilia NOT DETECTED NOT DETECTED Final   Candida albicans NOT DETECTED NOT DETECTED Final   Candida auris NOT DETECTED NOT DETECTED Final   Candida glabrata NOT DETECTED NOT DETECTED Final   Candida krusei NOT DETECTED NOT DETECTED Final   Candida parapsilosis NOT DETECTED NOT DETECTED Final   Candida tropicalis NOT DETECTED NOT DETECTED Final   Cryptococcus neoformans/gattii NOT DETECTED NOT DETECTED Final   Methicillin resistance mecA/C DETECTED  (A) NOT DETECTED Final    Comment: CRITICAL RESULT CALLED TO, READ BACK BY AND VERIFIED WITH: AMY THOMPSON AT [id number] ON 01/16/2021 [facility]. Performed at [facility], [address] [place], [place] [address]   Resp Panel by RT-PCR (Flu A&B, Covid) Nasopharyngeal Swab     Status: None   Collection Time: 01/15/21  8:24 PM   Specimen: Nasopharyngeal Swab; Nasopharyngeal(NP) swabs in vial transport medium  Result Value Ref Range Status   SARS  Coronavirus 2 by RT PCR NEGATIVE NEGATIVE Final    Comment: (NOTE) SARS-CoV-2 target nucleic acids are NOT DETECTED.  The SARS-CoV-2 RNA is generally detectable in upper respiratory specimens during the acute phase of infection. The lowest concentration of SARS-CoV-2 viral copies this assay can detect is 138 copies/mL. A negative result does not preclude SARS-Cov-2 infection and should not be used as the sole basis for treatment or other patient management decisions. A negative result may occur with  improper specimen collection/handling, submission of specimen other than nasopharyngeal swab, presence of viral mutation(s) within the areas targeted by this assay, and inadequate number of viral copies(<138 copies/mL). A negative result must be combined with clinical observations, patient history, and epidemiological information. The expected result is Negative.  Fact Sheet for Patients:  BloggerCourse.com  Fact Sheet for Healthcare Providers:  SeriousBroker.it  This test is no t yet approved or cleared by the [place] FDA and  has been authorized for detection and/or diagnosis of SARS-CoV-2 by FDA under an Emergency Use Authorization (EUA). This EUA will remain  in effect (meaning this test can be used) for the duration of the COVID-19 declaration under Section 564(b)(1) of the Act, 21 U.S.C.section 360bbb-3(b)(1), unless the authorization is terminated  or revoked sooner.        Influenza A by PCR NEGATIVE NEGATIVE Final   Influenza B by PCR NEGATIVE NEGATIVE Final    Comment: (NOTE) The Xpert Xpress SARS-CoV-2/FLU/RSV plus assay is intended as an aid in the diagnosis of influenza from Nasopharyngeal swab specimens and should not be used as a sole basis for treatment. Nasal washings and aspirates are unacceptable for Xpert Xpress SARS-CoV-2/FLU/RSV testing.  Fact Sheet for Patients: BloggerCourse.com  Fact Sheet for Healthcare Providers: SeriousBroker.it  This test is not yet approved or cleared by the [place] FDA and has been authorized for detection and/or diagnosis of SARS-CoV-2 by FDA under an Emergency Use Authorization (EUA). This EUA will remain in effect (meaning this test can be used) for the duration of the COVID-19 declaration under Section 564(b)(1) of the Act, 21 U.S.C. section 360bbb-3(b)(1), unless the authorization is terminated or revoked.  Performed at [facility], [address] [place], [place] [address]   Aerobic/Anaerobic Culture w Gram Stain (surgical/deep wound)     Status: None (Preliminary result)   Collection Time: 01/16/21  2:25 PM   Specimen: Wound; Abscess  Result Value Ref Range Status   Specimen Description   Final    WOUND Performed at [facility], [address], [place], [place] [address]    Special Requests   Final    LEFT HIP Performed at [facility], [address] [place], [place] [address]    Gram Stain   Final    RARE WBC PRESENT,BOTH PMN AND MONONUCLEAR NO ORGANISMS SEEN    Culture   Final    CULTURE REINCUBATED FOR BETTER GROWTH Performed at [facility], [address] [place], [place] [address]    Report Status PENDING  Incomplete  Aerobic/Anaerobic Culture w Gram Stain (surgical/deep wound)     Status: None (Preliminary result)   Collection Time: 01/16/21  2:25 PM   Specimen: Wound; Abscess  Result  Value Ref Range Status   Specimen Description   Final    WOUND Performed at [facility], [address] [place], [place] [address]    Special Requests LEFT HIP  Final   Gram Stain   Final    FEW WBC  PRESENT, PREDOMINANTLY PMN NO ORGANISMS SEEN    Culture   Final    NO GROWTH < 12 HOURS Performed at [facility], [address], [place], [place] [address]    Report Status PENDING  Incomplete  Aerobic/Anaerobic Culture w Gram Stain (surgical/deep wound)     Status: None (Preliminary result)   Collection Time: 01/16/21  2:26 PM   Specimen: Wound; Abscess  Result Value Ref Range Status   Specimen Description   Final    WOUND Performed at [facility], [address] [place], [place] [address]    Special Requests   Final    LEFT HIP Performed at [facility], [address] [place], [place] [address]    Gram Stain NO WBC SEEN NO ORGANISMS SEEN   Final   Culture   Final    NO GROWTH < 12 HOURS Performed at [facility], [address] [place], [place] [address]    Report Status PENDING  Incomplete         Radiology Studies: CT HIP LEFT W CONTRAST  Result Date: 01/15/2021 CLINICAL DATA:  Drainage at left hip surgical site EXAM: CT OF THE LOWER LEFT EXTREMITY WITH CONTRAST TECHNIQUE: Multidetector CT imaging of the lower left extremity was performed according to the standard protocol following intravenous contrast administration. CONTRAST:  OMNIPAQUE IOHEXOL 300 MG/ML  SOLN COMPARISON:  Fluoroscopic images dated 10/03/2020 FINDINGS: Bones/Joint/Cartilage Status post ORIF of a comminuted intertrochanteric fracture of the left femur with antegrade long intramedullary rod and 2 proximal lag screws. No bridging bone formation across the fracture site. Displaced greater and lesser trochanteric fracture fragments, grossly similar alignment compared to intraoperative fluoroscopic images. No appreciable hip joint effusion. No evidence of  acute fracture or dislocation. No evidence to suggest hardware loosening. Visualized portion of the left hemipelvis is intact. Ligaments Suboptimally assessed by CT. Muscles and Tendons No acute musculotendinous abnormality by CT. Soft tissues Skin thickening overlies the lateral aspect of the left hip. Small fluid collection within the subcutaneous soft tissues, likely along a surgical incision site the level of the left iliac wing measuring approximately 4.1 x 0.8 x 3.2 cm (series 5, image 28). Mild subcutaneous edema throughout the lateral aspect of the left hip. Partially visualized infrarenal abdominal aortic aneurysm with extensive mural thrombus measuring approximately 6.7 x 6.1 cm (series 5, image 1). Proximal right common iliac artery aneurysm measuring approximately 2.5 cm. Extensive vascular atherosclerotic calcification. No left inguinal lymphadenopathy. IMPRESSION: 1. Status post ORIF of a comminuted intertrochanteric fracture of the left femur with displaced greater and lesser trochanteric fracture fragments, grossly similar alignment compared to intraoperative fluoroscopic images. No bridging bone formation across the fracture site. 2. Small fluid collection within the subcutaneous soft tissues, likely along a surgical incision site the level of the left iliac wing measuring approximately 4.1 x 0.8 x 3.2 cm. Findings may represent a postoperative seroma or hematoma. Infected fluid collection would be difficult to exclude by imaging alone. 3. Partially visualized infrarenal abdominal aortic aneurysm with extensive mural thrombus measuring approximately 6.7 x 6.1 cm. Recommend referral to a vascular specialist. This recommendation follows ACR consensus guidelines: White Paper of the ACR Incidental Findings Committee II on Vascular Findings. J Am Coll Radiol 2013; 10:789-794. 4. Proximal right common iliac artery aneurysm measuring approximately 2.5 cm. Electronically Signed   By: Duanne Guess D.O.    On: 01/15/2021 18:45   CT Angio Chest/Abd/Pel for Dissection W and/or W/WO  Result  Date: 01/17/2021 CLINICAL DATA:  Thoracoabdominal aortic dissection follow-up EXAM: CT ANGIOGRAPHY CHEST, ABDOMEN AND PELVIS TECHNIQUE: Non-contrast CT of the chest was initially obtained. Multidetector CT imaging through the chest, abdomen and pelvis was performed using the standard protocol during bolus administration of intravenous contrast. Multiplanar reconstructed images and MIPs were obtained and reviewed to evaluate the vascular anatomy. CONTRAST:  OMNIPAQUE IOHEXOL 350 MG/ML SOLN COMPARISON:  CT left hip 01/15/2021 CT chest 10/05/2020 FINDINGS: CTA CHEST FINDINGS Cardiovascular: Heart size at upper limits of normal. Coronary calcifications are noted. Dilated pulmonary artery suspicious for pulmonary arterial hypertension. Mediastinum/Nodes: Hilar and mediastinal lymphadenopathy again seen. Lungs/Pleura: No focal airspace opacity to indicate pneumonia. Mild diffuse septal thickening and increased interstitial opacities are similar to prior examination. These findings are nonspecific indicator of inflammation or edema. 5 mm noncalcified nodule in the right upper lobe (image 43, series 100,000) is not significantly changed in size since the prior study. Musculoskeletal: No chest wall abnormality. No acute or significant osseous findings. Review of the MIP images confirms the above findings. CTA ABDOMEN AND PELVIS FINDINGS VASCULAR Aorta: No significant abnormality of the thoracic aorta. There is aneurysmal dilatation of the infrarenal abdominal aorta measuring up to 7.7 x 7.6 cm Celiac: Severe focal stenosis at the origin of the celiac artery which remains patent. SMA: Severe focal stenosis at the origin of the superior mesenteric artery. Renals: Severe stenosis at the origin of the right main renal artery. Mild stenosis at the origin of the left main renal artery. IMA: Occluded at the origin. Distal branches  demonstrate opacification indicative of retrograde collateral flow. Inflow: Aneurysmal dilatation of the right common iliac artery measuring up to 2.9 x 2.6 cm. 1.0 cm aneurysm seen adjacent to the right side of the rectum (image 179/series 4). This is likely originating from a rectal branch of the posterior division of the right internal iliac artery. No significant abnormality of the left inflow arteries. Veins: No obvious venous abnormality within the limitations of this arterial phase study. Review of the MIP images confirms the above findings. NON-VASCULAR Hepatobiliary: No focal hepatic lesion. Cholelithiasis. No bile duct dilatation. Pancreas: Unremarkable. Spleen: Punctate splenic calcifications likely due to prior granulomatous inflammation. Adrenals/Urinary Tract: Adrenal glands normal in appearance. Subcentimeter left renal hypodense lesions are too small to fully characterize. There is suggestion of a solid mass at the lower pole the left kidney measuring 1.2 cm in diameter. Stomach/Bowel: Hyperdense structure in the gastric lumen likely related to ingested medication. The duodenum does not cross midline suggestive of incomplete bowel rotation. No bowel dilatation to indicate ileus or obstruction. Mild distal descending colon diverticulosis without evidence of acute diverticulitis. Appendix is not definitively identified. Lymphatic: No enlarged abdominal or pelvic lymph nodes. Reproductive: Uterus and bilateral adnexa are unremarkable. Other: Small fat containing umbilical hernia. Mild rectus sheath diastasis. Nonspecific focal skin thickening of the right anterior pelvis best seen on image 168 of series 4. Musculoskeletal: Left femur fracture again seen. Proximal half of the fixation hardware of the left femur is seen. Review of the MIP images confirms the above findings. IMPRESSION: 1. Infrarenal abdominal aortic aneurysm measuring up to 7.7 x 7.6 cm. Recommend referral to a vascular specialist. This  recommendation follows ACR consensus guidelines: White Paper of the ACR Incidental Findings Committee II on Vascular Findings. J Am Coll Radiol 2013; 10:789-794. 2. 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 3. 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 4. 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 5. Mediastinal and hilar lymphadenopathy similar to prior examination. This may be due to inflammation or malignancy. 6. Incompletely characterized 1.2 cm mass at the inferior tip of the left kidney should be further evaluated with contrast enhanced CT or MRI of the abdomen on nonemergent basis. 7. 5 mm right upper lobe pulmonary nodule is unchanged since prior exam. No follow-up needed if patient is low-risk. Non-contrast chest CT can be considered in 12 months if patient is high-risk. This recommendation follows the consensus statement: Guidelines for Management of Incidental Pulmonary Nodules Detected on CT Images: From the Fleischner Society 2017; Radiology 2017; 284:228-243. These results will be called to the ordering clinician or representative by the Radiologist Assistant, and communication documented in the PACS or [employer]. Electronically Signed   By: Acquanetta Belling M.D.   On: 01/17/2021 16:37        Scheduled Meds: . acetaminophen  1,000 mg Oral Q6H  . docusate sodium  100 mg Oral BID  . enoxaparin (LOVENOX) injection  40 mg Subcutaneous Q24H  . feeding supplement  237 mL Oral BID BM  . midodrine  2.5 mg Oral BID WC  . multivitamin with minerals  1 tablet Oral Daily  . rosuvastatin  10 mg Oral Daily  . torsemide  10 mg Oral Daily   Continuous Infusions: . sodium chloride 75 mL/hr at 01/16/21 1706  . ceFEPime (MAXIPIME) IV 2 g (01/17/21 1320)  . methocarbamol (ROBAXIN) IV    . vancomycin 1,250 mg (01/16/21 1825)     LOS: 2 days    Time spent: 35 minutes    Alberteen Sam, MD [facility] 01/17/2021, 5:08 PM     Please page though AMION or Epic secure chat:  For [employer], Higher education careers adviser

## 2021-01-17 NOTE — Progress Notes (Signed)
Dr Maryfrances Bunnell at bedside. New orders received for n/v. Also had him look her boils and worsening redness in groin. Continue to watch and no more powder or cream for now.

## 2021-01-17 NOTE — Anesthesia Postprocedure Evaluation (Signed)
Anesthesia Post Note  Patient: Monica [name]  Procedure(s) Performed: IRRIGATION AND DEBRIDEMENT HIP (Left Hip)  Patient location during evaluation: PACU Anesthesia Type: General Level of consciousness: awake and alert and oriented Pain management: pain level controlled Vital Signs Assessment: post-procedure vital signs reviewed and stable Respiratory status: spontaneous breathing, nonlabored ventilation and respiratory function stable Cardiovascular status: blood pressure returned to baseline and stable Postop Assessment: no signs of nausea or vomiting Anesthetic complications: no   No complications documented.   Last Vitals:  Vitals:   01/17/21 0538 01/17/21 0742  BP: (!) 100/58 (!) 83/60  Pulse: 82 84  Resp: 18 20  Temp: 37.2 C 37.1 C  SpO2: 91% 90%    Last Pain:  Vitals:   01/17/21 0742  TempSrc: Oral  PainSc:                  Jermika Olden

## 2021-01-17 NOTE — Progress Notes (Signed)
Pharmacy Antibiotic Note  Monica [name] is a 87 y.o. female admitted on 01/15/2021 with cellulitis.  Pharmacy has been consulted for Vancomycin, Cefepime  Dosing.  1 Bloodcx: Staph epi- sens pending (BCID= MRSE?) Wound JQ:GBEEFEO ID following  Plan: - adjust Cefepime from 2 gm IV Q8H to 2 gm q12h for Crcl 53 ml/min  -Vancomycin 1 gm IV X 1 given in ED on 6/6 @ 1922. Additional Vancomycin 1500 mg IV X 1 ordered to make total loading dose of 2500 mg. Continue Vancomycin 1250 mg IV Q24H. AUC = 548 Vanc trough = 13.9   F/u Scr, cx    Height: 5\' 5"  (165.1 cm) Weight: 106.6 kg (235 lb) IBW/kg (Calculated) : 57  Temp (24hrs), Avg:98.3 F (36.8 C), Min:97.6 F (36.4 C), Max:99 F (37.2 C)  Recent Labs  Lab 01/15/21 1643 01/16/21 0410 01/17/21 0449  WBC 10.0 8.8 8.6  CREATININE 0.80 0.74 0.94    Estimated Creatinine Clearance: 53 mL/min (by C-G formula based on SCr of 0.94 mg/dL).    No Known Allergies  Antimicrobials this admission: zosyn x1 dose 6/6 cefepime 6/7>> Vanc 6/6 (evening)>>  Dose adjustments this admission:   Microbiology results:  6/6 BCx: 2of4 (1 set)= GPC=Staph epi, sens pending 6/7 Wound pend   Thank you for allowing pharmacy to be a part of this patient's care.  Pina Sirianni A 01/17/2021 9:49 AM

## 2021-01-17 NOTE — Progress Notes (Signed)
PT Cancellation Note  Patient Details Name: Monica [name] MRN: [id number] DOB: 04-08-1934   Cancelled Treatment:    Reason Eval/Treat Not Completed: Other (comment).  PT consult received.  Chart reviewed.  Pt noted for possible infrarenal abdominal aortic aneurysm and pending CT angio.  Will hold therapy at this time and will follow-up with vascular for clearance to proceed with therapy evaluation once CT angio completed.  Hendricks Limes, PT 01/17/21, 10:05 AM

## 2021-01-17 NOTE — Progress Notes (Signed)
md notified of bp 88/52

## 2021-01-17 NOTE — Evaluation (Signed)
Occupational Therapy Evaluation Patient Details Name: Monica [name] MRN: [id number] DOB: 16-Jun-1934 Today's Date: 01/17/2021    History of Present Illness Pt isi an 87 y/o F with PMH: MI with stent placed in 2018, aortic stenosis, AAA, GIB, and L IM nail 2/2 fall in Feb 2022. Pt presents to [facility] with c/o L LE pain, swelling and reporting that one of her incisions from February surgery has "burst open". Pt is s/p L hip I&D on 01/16/2021.   Clinical Impression   Pt seen for OT evaluation this date in setting of acute hospitalization d/t L hip wound infection. Pt requires increased time and encouragement to participate. Pt reports that since her last hospitalization in February in which she underwent L hip nailing s/p fall, that she went to rehab but has primarily only been able to take small pivoting steps from her bed to [facility]. Pt states that she lives with dtr in [facility] with 4+ steps to enter and states that she has only been able to leave the home with EMS transport in previous months. States she has hospital bed at home, [facility], and wheelchair, but states she hasn't really been able to practice transferring to wheelchair and struggles to explain why to OT when asked. This date, pt requires MOD A to come to EOB sitting with HOB elevated and cues to sequence. Pt demos G static sitting balance and OT engages pt in UB bathing and dressing with MIN/MOD A. Unsuccessful with attempts to come to standing with one person and pt not agreeable to attempting with 2p assist despite OT's encouragement and PT willing to assist with patient transfers. Pt requires MAX/TOTAL A for LB bathing in EOB sitting, TOTAL A (2 people) to perform sit to sup, and requires MAX/TOTAL A with peri care while lying in bed using rolling technique with 2p assisting. Pt left with all needs met and in reach. Very limited functionally by pain and decreased strength and activity tolerance at this time. Will continue follow. Anticipate pt will require  STR stay following hospitalization as it is currently requiring 2p assist for some aspects of basic self care and will likely require 2p assist or higher (equipment/lift) for ADL transfers.      Follow Up Recommendations  SNF    Equipment Recommendations  Other (comment) (defer)    Recommendations for Other Services       Precautions / Restrictions Precautions Precautions: Fall Restrictions Weight Bearing Restrictions: No      Mobility Bed Mobility Overal bed mobility: Needs Assistance Bed Mobility: Supine to Sit;Sit to Supine     Supine to sit: Mod assist;HOB elevated Sit to supine: Max assist;Total assist;+2 for physical assistance;+2 for safety/equipment   General bed mobility comments: increasd assist with LEs for back to bed    Transfers Overall transfer level: Needs assistance   Transfers: Lateral/Scoot Transfers;Sit to/from Stand Sit to Stand: Total assist;From elevated surface        Lateral/Scoot Transfers: Max assist;Total assist;+2 physical assistance General transfer comment: attempted STS with 1 person from eelvated surface, but pt only able to clear her bottom ~1-2 inches, unsuccessful x1 trial to CTS. OT encourages pt to attempt with 2 people assisting instead, but pt is unwilling. OT requests that pt allow for PT to join session so that 2 people could assist and maximize her services given low fxl tolerance. Pt again declines and sternly states she will not be attempting to get up or scoot again today. RN, MD and PT notified.  Balance Overall balance assessment: Needs assistance Sitting-balance support: Feet supported Sitting balance-Leahy Scale: Good       Standing balance-Leahy Scale: Zero Standing balance comment: unsuccessful transfer attempts with one person, requries 2p assist                           ADL either performed or assessed with clinical judgement   ADL Overall ADL's : Needs assistance/impaired                                        General ADL Comments: MIN/MOD A for seated UB ADLs including washing hair and donning clean gown. MAX to TOTAL A for seated LB ADLs. Requries rolling bed level at this time for bathing/peri care d/t unsuccessful with attempts at scoot or pivoting transfers.     Vision Patient Visual Report: No change from baseline       Perception     Praxis      Pertinent Vitals/Pain Pain Assessment: Faces Faces Pain Scale: Hurts even more Pain Location: L hip and knee Pain Descriptors / Indicators: Grimacing;Tender;Tightness Pain Intervention(s): Limited activity within patient's tolerance;Monitored during session     Hand Dominance Right   Extremity/Trunk Assessment Upper Extremity Assessment Upper Extremity Assessment: Generalized weakness   Lower Extremity Assessment Lower Extremity Assessment: Generalized weakness;LLE deficits/detail LLE: Unable to fully assess due to pain       Communication Communication Communication: HOH   Cognition Arousal/Alertness: Awake/alert Behavior During Therapy: WFL for tasks assessed/performed Overall Cognitive Status: Difficult to assess                                 General Comments: requires some repetition of cues/instructions. Some delayed responses. Appears to be grossly oriented, so confusion may be r/t HOH.   General Comments       Exercises Other Exercises Other Exercises: OT ed re: role of OT, importance of OOB activity, safety with RW, safe transfer technique. OT engages pt seated UB bathing with MIN A and MAX/TOTAL A for LB bathing bed level using rolling technique with 2p assist to roll and use of bed rails.   Shoulder Instructions      Home Living Family/patient expects to be discharged to:: Private residence Living Arrangements: Children Available Help at Discharge: Family;Available PRN/intermittently (dtr lives with pt) Type of Home: House Home Access: Stairs to enter [employer] of Steps: 4+3   Home Layout: One level               Home Equipment: Walker - 2 wheels;Cane - single point;Bedside commode;Hospital bed;Wheelchair - manual          Prior Functioning/Environment Level of Independence: Needs assistance  Gait / Transfers Assistance Needed: Pt reports that before Feb sx, she was ambulatory, but states since that time (approximately 4 months time) she has only performed small steps/pivots wtih assistance from family, from her hospital bed to [facility]. States she has not yet pivoted to wheelchair at home. ADL's / Homemaking Assistance Needed: States she has a PCA that helps 3x wk with bathing. States her dtr performs most IADLs. States he son runs errands/drives her.   Comments: States she needs her specific shoes with wedges to perform transfers and mobility.        OT Problem List:  Decreased strength;Decreased range of motion;Decreased activity tolerance;Impaired balance (sitting and/or standing);Decreased knowledge of use of DME or AE;Obesity;Pain;Increased edema      OT Treatment/Interventions: Self-care/ADL training;DME and/or AE instruction;Therapeutic activities;Balance training;Therapeutic exercise;Patient/family education    OT Goals(Current goals can be found in the care plan section) Acute Rehab OT Goals Patient Stated Goal: to get stronger and walk again OT Goal Formulation: With patient Time For Goal Achievement: 01/31/21 Potential to Achieve Goals: Fair ADL Goals Pt Will Perform Upper Body Bathing: with modified independence;sitting Pt Will Perform Upper Body Dressing: with modified independence;sitting Pt Will Transfer to Toilet: with min assist;with mod assist;stand pivot transfer;bedside commode Pt Will Perform Toileting - Clothing Manipulation and hygiene: with mod assist;sitting/lateral leans Pt/caregiver will Perform Home Exercise Program: Increased strength;Both right and left upper extremity;With minimal assist  OT  Frequency: Min 1X/week   Barriers to D/C:            Co-evaluation              AM-PAC OT "6 Clicks" Daily Activity     Outcome Measure Help from another person eating meals?: None Help from another person taking care of personal grooming?: A Little Help from another person toileting, which includes using toliet, bedpan, or urinal?: Total Help from another person bathing (including washing, rinsing, drying)?: A Lot Help from another person to put on and taking off regular upper body clothing?: A Lot Help from another person to put on and taking off regular lower body clothing?: Total 6 Click Score: 13   End of Session Equipment Utilized During Treatment: Gait belt;Oxygen Nurse Communication: Mobility status  Activity Tolerance: Other (comment) (somewhat self limiting) Patient left: in bed;with call bell/phone within reach;with bed alarm set  OT Visit Diagnosis: Unsteadiness on feet (R26.81);Muscle weakness (generalized) (M62.81);Pain Pain - Right/Left: Left Pain - part of body: Hip                Time: 8916-9450 OT Time Calculation (min): 57 min Charges:  OT General Charges $OT Visit: 1 Visit OT Evaluation $OT Eval Moderate Complexity: 1 Mod OT Treatments $Self Care/Home Management : 23-37 mins $Therapeutic Activity: 23-37 mins  Gerrianne Scale, MS, OTR/L ascom [phone] 01/17/21, 4:56 PM

## 2021-01-18 DIAGNOSIS — I5022 Chronic systolic (congestive) heart failure: Secondary | ICD-10-CM | POA: Diagnosis not present

## 2021-01-18 DIAGNOSIS — I2510 Atherosclerotic heart disease of native coronary artery without angina pectoris: Secondary | ICD-10-CM

## 2021-01-18 DIAGNOSIS — Z01810 Encounter for preprocedural cardiovascular examination: Secondary | ICD-10-CM

## 2021-01-18 DIAGNOSIS — I350 Nonrheumatic aortic (valve) stenosis: Secondary | ICD-10-CM

## 2021-01-18 DIAGNOSIS — I714 Abdominal aortic aneurysm, without rupture: Secondary | ICD-10-CM

## 2021-01-18 DIAGNOSIS — I959 Hypotension, unspecified: Secondary | ICD-10-CM

## 2021-01-18 DIAGNOSIS — L02416 Cutaneous abscess of left lower limb: Secondary | ICD-10-CM | POA: Diagnosis not present

## 2021-01-18 LAB — BASIC METABOLIC PANEL
Anion gap: 8 (ref 5–15)
BUN: 21 mg/dL (ref 8–23)
CO2: 23 mmol/L (ref 22–32)
Calcium: 8.6 mg/dL — ABNORMAL LOW (ref 8.9–10.3)
Chloride: 103 mmol/L (ref 98–111)
Creatinine, Ser: 0.91 mg/dL (ref 0.44–1.00)
GFR, Estimated: >60 mL/min (ref >60)
Glucose, Bld: 119 mg/dL — ABNORMAL HIGH (ref 70–99)
Potassium: 4.3 mmol/L (ref 3.5–5.1)
Sodium: 134 mmol/L — ABNORMAL LOW (ref 135–145)

## 2021-01-18 LAB — CBC
HCT: 37.3 % (ref 36.0–46.0)
Hemoglobin: 11.2 g/dL — ABNORMAL LOW (ref 12.0–15.0)
MCH: 23.5 pg — ABNORMAL LOW (ref 26.0–34.0)
MCHC: 30 g/dL (ref 30.0–36.0)
MCV: 78.4 fL — ABNORMAL LOW (ref 80.0–100.0)
Platelets: 276 10*3/uL (ref 150–400)
RBC: 4.76 MIL/uL (ref 3.87–5.11)
RDW: 17.9 % — ABNORMAL HIGH (ref 11.5–15.5)
WBC: 9.3 10*3/uL (ref 4.0–10.5)
nRBC: 0 % (ref 0.0–0.2)

## 2021-01-18 LAB — CULTURE, BLOOD (ROUTINE X 2): Special Requests: ADEQUATE

## 2021-01-18 LAB — SEDIMENTATION RATE: Sed Rate: 44 mm/hr — ABNORMAL HIGH (ref 0–30)

## 2021-01-18 MED ORDER — SCOPOLAMINE 1 MG/3DAYS TD PT72
1.0000 | MEDICATED_PATCH | TRANSDERMAL | Status: DC
  Filled 2021-01-18: qty 1

## 2021-01-18 NOTE — Progress Notes (Signed)
ID Pt doing okay Pain less left hip  BP 109/63 (BP Location: Right Arm)   Pulse 77   Temp 97.7 F (36.5 C)   Resp 16   Ht 5' 5"  (1.651 m)   Wt 106.6 kg   SpO2 100%   BMI 39.11 kg/m    O/e awake and alert Chest b/l air entry [id number] harsh systolic murmur aortic area Abd soft Left hip- wound vac CNS non focal  Lab CBC Latest Ref Rng & Units 01/18/2021 01/17/2021 01/16/2021  WBC 4.0 - 10.5 K/uL 9.3 8.6 8.8  Hemoglobin 12.0 - 15.0 g/dL 11.2(L) 11.2(L) 11.2(L)  Hematocrit 36.0 - 46.0 % 37.3 36.7 36.4  Platelets 150 - 400 K/uL 276 285 269     CMP Latest Ref Rng & Units 01/18/2021 01/17/2021 01/16/2021  Glucose 70 - 99 mg/dL 119(H) 103(H) 109(H)  BUN 8 - 23 mg/dL 21 16 14   Creatinine 0.44 - 1.00 mg/dL 0.91 0.94 0.74  Sodium 135 - 145 mmol/L 134(L) 133(L) 133(L)  Potassium 3.5 - 5.1 mmol/L 4.3 4.7 4.3  Chloride 98 - 111 mmol/L 103 103 102  CO2 22 - 32 mmol/L 23 22 25   Calcium 8.9 - 10.3 mg/dL 8.6(L) 8.5(L) 8.2(L)  Total Protein 6.5 - 8.1 g/dL - - -  Total Bilirubin 0.3 - 1.2 mg/dL - - -  Alkaline Phos 38 - 126 U/L - - -  AST 15 - 41 U/L - - -  ALT 0 - 44 U/L - - -    Micro BC from 01/15/21- 1 of 4 bottle staph epi Surgical cultures all three have staph aureus  ESR 44  Impression/recommendation  Infection at the surgical site of the prior left ORIF. CT scan showed a subcutaneous collection, and no bridging bone in the fracture area. All three surgical cultures ( superficial . Deep and from the track) have staph aureus Need to treat this aggressively like a deep infection involving bone Currently on vanco- once we know the susceptibility will narrow accordingly=will need atleast 4 weeks IV, will need PO rifampin if no contraindication or DDI    Staph epidermidis 1 out of 4 bottle very likely contaminant but because of the foreign body and her history at the moment we are treating this like a true pathogen.   Incidental finding of an infrarenal aortic aneurysm measuring > 7  cm  high risk for complications Need to weigh the risk of spontaneous rupture VS risk of endovascular infection If we can safely buy some time then would recommend to treat for atleast 2 weeks before taking  for endovascular surgery. As blood culture is negative for staph aureus , it indicates that it is not a systemic infection and less chance of seeding . If surgery is emergently needed then atleast 7 days of antibiotics may help     CAD status post stent   Aortic stenosis  Discussed the management with the care team

## 2021-01-18 NOTE — Consult Note (Signed)
Cardiology Consultation:   Patient ID: Monica [name] MRN: [id number]; DOB: Jun 14, 1934  Admit date: 01/15/2021 Date of Consult: 01/18/2021  PCP:  Patient, No Pcp Per (Inactive)   [facility] Providers Cardiologist:  Lorine Bears, MD     Patient Profile:   Monica [name] is a 87 y.o. female with a hx of coronary artery disease with previous inferior STEMI in October 2018 complicated by hypotension and bradycardia status post PCI and drug-eluting stent placement to proximal right coronary artery, severe aortic stenosis, GI bleed in 2018, previous tobacco use and hyperlipidemia who is being seen 01/18/2021 for the evaluation of preoperative cardiovascular evaluation for abdominal aortic aneurysm repair at the request of Dr. Gilda Crease.  History of Present Illness:   Monica [name] is an 87 year old female who is well-known to me.  She was admitted to the hospital in 05/2017 with an inferior STEMI complicated by hypotension and bradycardia.  LHC showed severe one-vessel CAD with thrombotic occlusion of the proximal and mid RCA as well as moderate ostial LCx and mild LAD stenoses.  She underwent successful PCI/DES to the proximal RCA.  There was at least moderate aortic stenosis noted at that time.  Echo in 05/2017 showed an EF of 55 to 60%, mild hypokinesis of the basal mid inferior myocardium, grade 1 diastolic dysfunction, moderate to severe aortic stenosis with a mean gradient of 21 mmHg and a valve area of 0.99 cm.  She was readmitted in 2018 with an upper GI bleed which required packed red blood cell transfusion.  In this setting Brilinta was transitioned to Plavix.  Echo on 10/2019 showed a preserved LV systolic function with progression of aortic disease to the severe range with a mean gradient of 34 mmHg and a valve area of 0.88 cm.   She was hospitalized in February after a mechanical fall with left femur fracture.  An echocardiogram was done during that admission which showed normal LV systolic  function.  However, her aortic stenosis was noted to have progressed significantly with mean gradient of 60 mmHg and valve area of 0.51.  At that time, the patient was felt to be high risk for hip surgery but there was no other alternative and the patient proceeded with surgery without significant complications.  She was discharged to rehab but reports inability to walk since the surgery due to weakness.  She returned back with postsurgical have infection and underwent debridement.  She had a CT scan of the hips which incidentally showed evidence of large abdominal aortic aneurysm.  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  This is a new finding.  The patient denies abdominal pain.  No recent chest pain but does report worsening exertional dyspnea.      Past Medical History:  Diagnosis Date   Aortic stenosis    a. LHC 06/02/17: At least moderate aortic stenosis with a peak to peak gradient of 22 mmHg; b. TTE 05/2017: EF 55-60%, mild HK basal-midinferior wall, Gr1DD, mod to sev AS w/ mean gradient 21 mmHg, valve area 0.99, mild MR, mildly dilated LA    CAD (coronary artery disease) 2018   a. inferior STEMI 06/02/2017: LHC 06/02/17: LM 20, D1 20%, o-pLCx 50, p-mRCA 100% s/p PCI/DES, mRCA 50, dRCA 30   GI bleed    a. noted 06/05/2017   HLD (hyperlipidemia)    Obesity     Past Surgical History:  Procedure Laterality Date   ANKLE RECONSTRUCTION  1956   also ORIF of right arm  CORONARY STENT INTERVENTION N/A 06/01/2017   Procedure: Coronary/Graft Acute MI Revascularization;  Surgeon: Iran Ouch, MD;  Location: [facility] INVASIVE CV LAB;  Service: Cardiovascular;  Laterality: N/A;   INCISION AND DRAINAGE HIP Left 01/16/2021   Procedure: IRRIGATION AND DEBRIDEMENT HIP;  Surgeon: Signa Kell, MD;  Location: [facility] ORS;  Service: Orthopedics;  Laterality: Left;   INTRAMEDULLARY (IM) NAIL INTERTROCHANTERIC Left 10/03/2020   Procedure: INTRAMEDULLARY (IM) NAIL INTERTROCHANTRIC;   Surgeon: Signa Kell, MD;  Location: [facility] ORS;  Service: Orthopedics;  Laterality: Left;   LEFT HEART CATH AND CORONARY ANGIOGRAPHY N/A 06/01/2017   Procedure: LEFT HEART CATH AND CORONARY ANGIOGRAPHY;  Surgeon: Iran Ouch, MD;  Location: [facility] INVASIVE CV LAB;  Service: Cardiovascular;  Laterality: N/A;     Home Medications:  Prior to Admission medications   Medication Sig Start Date End Date Taking? Authorizing Provider  acetaminophen (TYLENOL) 325 MG tablet Take 2 tablets (650 mg total) by mouth every 6 (six) hours as needed for mild pain, fever or headache. 10/09/20 10/09/21  Esaw Grandchild A, DO  aspirin 81 MG EC tablet Take 81 mg by mouth daily.    [provider]  bisacodyl (DULCOLAX) 5 MG EC tablet Take 1 tablet (5 mg total) by mouth daily as needed for moderate constipation. 10/09/20   Pennie Banter, DO  docusate sodium (COLACE) 100 MG capsule Take 1 capsule (100 mg total) by mouth 2 (two) times daily. 10/09/20   Esaw Grandchild A, DO  enoxaparin (LOVENOX) 40 MG/0.4ML injection Inject 0.4 mLs (40 mg total) into the skin daily for 14 days. 10/04/20 10/18/20  Dedra Skeens, PA-C  feeding supplement (ENSURE ENLIVE / ENSURE PLUS) LIQD Take 237 mLs by mouth 2 (two) times daily between meals. 10/09/20   Pennie Banter, DO  methocarbamol (ROBAXIN) 500 MG tablet Take 1 tablet (500 mg total) by mouth every 6 (six) hours as needed for muscle spasms. 10/09/20   Pennie Banter, DO  metoCLOPramide (REGLAN) 5 MG tablet Take 1-2 tablets (5-10 mg total) by mouth every 8 (eight) hours as needed for nausea (if ondansetron (ZOFRAN) ineffective.). 10/09/20   Pennie Banter, DO  midodrine (PROAMATINE) 2.5 MG tablet Take 1 tablet (2.5 mg total) by mouth 2 (two) times daily with a meal. 10/09/20   Pennie Banter, DO  Multiple Vitamin (MULTIVITAMIN WITH MINERALS) TABS tablet Take 1 tablet by mouth daily. 10/10/20   Pennie Banter, DO  nitroGLYCERIN (NITROSTAT) 0.4 MG SL tablet Place 1 tablet  (0.4 mg total) under the tongue every 5 (five) minutes as needed for chest pain. 06/04/17   Shaune Pollack, MD  ondansetron (ZOFRAN) 4 MG tablet Take 1 tablet (4 mg total) by mouth every 6 (six) hours as needed for nausea. 10/09/20   Pennie Banter, DO  polyethylene glycol (MIRALAX / GLYCOLAX) 17 g packet Take 17 g by mouth daily. 10/10/20   Esaw Grandchild A, DO  rosuvastatin (CRESTOR) 10 MG tablet Take 1 tablet by mouth once daily Patient taking differently: Take 10 mg by mouth daily. 07/24/20   Iran Ouch, MD  senna-docusate (SENOKOT-S) 8.6-50 MG tablet Take 1 tablet by mouth 2 (two) times daily. 10/09/20   Pennie Banter, DO  torsemide (DEMADEX) 10 MG tablet Take 1 tablet (10 mg total) by mouth daily. 10/09/20   Pennie Banter, DO  traMADol (ULTRAM) 50 MG tablet Take 1 tablet (50 mg total) by mouth every 6 (six) hours as needed for moderate pain. 10/04/20  Dedra Skeens, PA-C    Inpatient Medications: Scheduled Meds:  docusate sodium  100 mg Oral BID   enoxaparin (LOVENOX) injection  40 mg Subcutaneous Q24H   feeding supplement  237 mL Oral BID BM   midodrine  2.5 mg Oral TID WC   multivitamin with minerals  1 tablet Oral Daily   rosuvastatin  10 mg Oral Daily   scopolamine  1 patch Transdermal Q72H   torsemide  10 mg Oral Daily   Continuous Infusions:  methocarbamol (ROBAXIN) IV     promethazine (PHENERGAN) injection (IM or IVPB)     vancomycin 1,250 mg (01/17/21 1818)   PRN Meds: bisacodyl, HYDROmorphone (DILAUDID) injection, methocarbamol **OR** methocarbamol (ROBAXIN) IV, metoCLOPramide **OR** metoCLOPramide (REGLAN) injection, nitroGLYCERIN, ondansetron **OR** ondansetron (ZOFRAN) IV, oxyCODONE, oxyCODONE, promethazine **OR** promethazine (PHENERGAN) injection (IM or IVPB) **OR** promethazine, senna-docusate, sodium phosphate, traMADol, traZODone  Allergies:   No Known Allergies  Social History:   Social History   Socioeconomic History   Marital status: Married     Spouse name: Not on file   Number of children: Not on file   Years of education: Not on file   Highest education level: Not on file  Occupational History   Occupation: retired  Tobacco Use   Smoking status: Former    Packs/day: 0.50    Years: 50.00    Pack years: 25.00    Types: Cigarettes    Quit date: 06/01/2017    Years since quitting: 3.6   Smokeless tobacco: Never  Vaping Use   Vaping Use: Never used  Substance and Sexual Activity   Alcohol use: No   Drug use: No   Sexual activity: Never    Partners: Female  Other Topics Concern   Not on file  Social History Narrative   Not on file   Social Determinants of Health   Financial Resource Strain: Not on file  Food Insecurity: Not on file  Transportation Needs: Not on file  Physical Activity: Not on file  Stress: Not on file  Social Connections: Not on file  Intimate Partner Violence: Not on file    Family History:    Family History  Problem Relation Age of Onset   Valvular heart disease Mother    Diabetes Mellitus II Daughter      ROS:  Please see the history of present illness.   All other ROS reviewed and negative.     Physical Exam/Data:   Vitals:   01/17/21 2003 01/18/21 0513 01/18/21 0723 01/18/21 1212  BP: 103/66 112/64 113/67 (!) 88/52  Pulse: 82 88 81 88  Resp: Temp: 97.8 F (36.6 C) 98.4 F (36.9 C) 98.3 F (36.8 C) 98.3 F (36.8 C)  TempSrc: Oral Oral  Oral  SpO2: 96% (!) 84% 94% 96%  Weight:      Height:        Intake/Output Summary (Last 24 hours) at 01/18/2021 1224 Last data filed at 01/18/2021 0430 Gross per 24 hour  Intake 1050.65 ml  Output 1850 ml  Net -799.35 ml   Last 3 Weights 01/15/2021 10/02/2020 05/16/2020  Weight (lbs) 235 lb 240 lb 256 lb  Weight (kg) 106.595 kg 108.863 kg 116.121 kg     Body mass index is 39.11 kg/m.  General:  Well nourished, well developed, in no acute distress HEENT: normal Lymph: no adenopathy Neck: no JVD Endocrine:  No  thryomegaly Vascular: No carotid bruits; FA pulses 2+ bilaterally without bruits  Cardiac:  normal  S1, absent S2; RRR; 3 out of 6 systolic murmur in the aortic area which is late peaking with absent S2 Lungs:  clear to auscultation bilaterally, no wheezing, rhonchi or rales  Abd: soft, nontender, no hepatomegaly  Ext: no edema Musculoskeletal:  No deformities, BUE and BLE strength normal and equal Skin: warm and dry  Neuro:  CNs 2-12 intact, no focal abnormalities noted Psych:  Normal affect   EKG:   No EKG done during this admission. Telemetry:    Relevant CV Studies: Most recent echocardiogram done in February was reviewed and the results are summarized above.  Laboratory Data:  High Sensitivity Troponin:  No results for input(s): TROPONINIHS in the last 720 hours.   Chemistry Recent Labs  Lab 01/16/21 0410 01/17/21 0449 01/18/21 0417  NA 133* 133* 134*  K 4.3 4.7 4.3  CL 102 103 103  CO2 25 22 23   GLUCOSE 109* 103* 119*  BUN 14 16 21   CREATININE 0.74 0.94 0.91  CALCIUM 8.2* 8.5* 8.6*  GFRNONAA >60 59* >60  ANIONGAP 6 8 8     Recent Labs  Lab 01/15/21 1643  PROT 7.8  ALBUMIN 3.3*  AST 17  ALT 8  ALKPHOS 66  BILITOT 1.3*   Hematology Recent Labs  Lab 01/16/21 0410 01/17/21 0449 01/18/21 0417  WBC 8.8 8.6 9.3  RBC 4.63 4.69 4.76  HGB 11.2* 11.2* 11.2*  HCT 36.4 36.7 37.3  MCV 78.6* 78.3* 78.4*  MCH 24.2* 23.9* 23.5*  MCHC 30.8 30.5 30.0  RDW 18.3* 18.2* 17.9*  PLT 269 285 276   BNPNo results for input(s): BNP, PROBNP in the last 168 hours.  DDimer No results for input(s): DDIMER in the last 168 hours.   Radiology/Studies:  CT HIP LEFT W CONTRAST  Result Date: 01/15/2021 CLINICAL DATA:  Drainage at left hip surgical site EXAM: CT OF THE LOWER LEFT EXTREMITY WITH CONTRAST TECHNIQUE: Multidetector CT imaging of the lower left extremity was performed according to the standard protocol following intravenous contrast administration. CONTRAST:  100mL  OMNIPAQUE IOHEXOL 300 MG/ML  SOLN COMPARISON:  Fluoroscopic images dated 10/03/2020 FINDINGS: Bones/Joint/Cartilage Status post ORIF of a comminuted intertrochanteric fracture of the left femur with antegrade long intramedullary rod and 2 proximal lag screws. No bridging bone formation across the fracture site. Displaced greater and lesser trochanteric fracture fragments, grossly similar alignment compared to intraoperative fluoroscopic images. No appreciable hip joint effusion. No evidence of acute fracture or dislocation. No evidence to suggest hardware loosening. Visualized portion of the left hemipelvis is intact. Ligaments Suboptimally assessed by CT. Muscles and Tendons No acute musculotendinous abnormality by CT. Soft tissues Skin thickening overlies the lateral aspect of the left hip. Small fluid collection within the subcutaneous soft tissues, likely along a surgical incision site the level of the left iliac wing measuring approximately 4.1 x 0.8 x 3.2 cm (series 5, image 28). Mild subcutaneous edema throughout the lateral aspect of the left hip. Partially visualized infrarenal abdominal aortic aneurysm with extensive mural thrombus measuring approximately 6.7 x 6.1 cm (series 5, image 1). Proximal right common iliac artery aneurysm measuring approximately 2.5 cm. Extensive vascular atherosclerotic calcification. No left inguinal lymphadenopathy. IMPRESSION: 1. Status post ORIF of a comminuted intertrochanteric fracture of the left femur with displaced greater and lesser trochanteric fracture fragments, grossly similar alignment compared to intraoperative fluoroscopic images. No bridging bone formation across the fracture site. 2. Small fluid collection within the subcutaneous soft tissues, likely along a surgical incision site the level of the left  iliac wing measuring approximately 4.1 x 0.8 x 3.2 cm. Findings may represent a postoperative seroma or hematoma. Infected fluid collection would be difficult  to exclude by imaging alone. 3. Partially visualized infrarenal abdominal aortic aneurysm with extensive mural thrombus measuring approximately 6.7 x 6.1 cm. Recommend referral to a vascular specialist. This recommendation follows ACR consensus guidelines: White Paper of the ACR Incidental Findings Committee II on Vascular Findings. J Am Coll Radiol 2013; 10:789-794. 4. Proximal right common iliac artery aneurysm measuring approximately 2.5 cm. Electronically Signed   By: Duanne Guess D.O.   On: 01/15/2021 18:45   CT Angio Chest/Abd/Pel for Dissection W and/or W/WO  Result Date: 01/17/2021 CLINICAL DATA:  Thoracoabdominal aortic dissection follow-up EXAM: CT ANGIOGRAPHY CHEST, ABDOMEN AND PELVIS TECHNIQUE: Non-contrast CT of the chest was initially obtained. Multidetector CT imaging through the chest, abdomen and pelvis was performed using the standard protocol during bolus administration of intravenous contrast. Multiplanar reconstructed images and MIPs were obtained and reviewed to evaluate the vascular anatomy. CONTRAST:  OMNIPAQUE IOHEXOL 350 MG/ML SOLN COMPARISON:  CT left hip 01/15/2021 CT chest 10/05/2020 FINDINGS: CTA CHEST FINDINGS Cardiovascular: Heart size at upper limits of normal. Coronary calcifications are noted. Dilated pulmonary artery suspicious for pulmonary arterial hypertension. Mediastinum/Nodes: Hilar and mediastinal lymphadenopathy again seen. Lungs/Pleura: No focal airspace opacity to indicate pneumonia. Mild diffuse septal thickening and increased interstitial opacities are similar to prior examination. These findings are nonspecific indicator of inflammation or edema. 5 mm noncalcified nodule in the right upper lobe (image 43, series 100,000) is not significantly changed in size since the prior study. Musculoskeletal: No chest wall abnormality. No acute or significant osseous findings. Review of the MIP images confirms the above findings. CTA ABDOMEN AND PELVIS FINDINGS  VASCULAR Aorta: No significant abnormality of the thoracic aorta. There is aneurysmal dilatation of the infrarenal abdominal aorta measuring up to 7.7 x 7.6 cm Celiac: Severe focal stenosis at the origin of the celiac artery which remains patent. SMA: Severe focal stenosis at the origin of the superior mesenteric artery. Renals: Severe stenosis at the origin of the right main renal artery. Mild stenosis at the origin of the left main renal artery. IMA: Occluded at the origin. Distal branches demonstrate opacification indicative of retrograde collateral flow. Inflow: Aneurysmal dilatation of the right common iliac artery measuring up to 2.9 x 2.6 cm. 1.0 cm aneurysm seen adjacent to the right side of the rectum (image 179/series 4). This is likely originating from a rectal branch of the posterior division of the right internal iliac artery. No significant abnormality of the left inflow arteries. Veins: No obvious venous abnormality within the limitations of this arterial phase study. Review of the MIP images confirms the above findings. NON-VASCULAR Hepatobiliary: No focal hepatic lesion. Cholelithiasis. No bile duct dilatation. Pancreas: Unremarkable. Spleen: Punctate splenic calcifications likely due to prior granulomatous inflammation. Adrenals/Urinary Tract: Adrenal glands normal in appearance. Subcentimeter left renal hypodense lesions are too small to fully characterize. There is suggestion of a solid mass at the lower pole the left kidney measuring 1.2 cm in diameter. Stomach/Bowel: Hyperdense structure in the gastric lumen likely related to ingested medication. The duodenum does not cross midline suggestive of incomplete bowel rotation. No bowel dilatation to indicate ileus or obstruction. Mild distal descending colon diverticulosis without evidence of acute diverticulitis. Appendix is not definitively identified. Lymphatic: No enlarged abdominal or pelvic lymph nodes. Reproductive: Uterus and bilateral  adnexa are unremarkable. Other: Small fat containing umbilical hernia. Mild rectus sheath diastasis.  Nonspecific focal skin thickening of the right anterior pelvis best seen on image 168 of series 4. Musculoskeletal: Left femur fracture again seen. Proximal half of the fixation hardware of the left femur is seen. Review of the MIP images confirms the above findings. IMPRESSION: 1. Infrarenal abdominal aortic aneurysm measuring up to 7.7 x 7.6 cm. Recommend referral to a vascular specialist. This recommendation follows ACR consensus guidelines: White Paper of the ACR Incidental Findings Committee II on Vascular Findings. J Am Coll Radiol 2013; 10:789-794. 2. 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 3. 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 4. 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 5. Mediastinal and hilar lymphadenopathy similar to prior examination. This may be due to inflammation or malignancy. 6. Incompletely characterized 1.2 cm mass at the inferior tip of the left kidney should be further evaluated with contrast enhanced CT or MRI of the abdomen on nonemergent basis. 7. 5 mm right upper lobe pulmonary nodule is unchanged since prior exam. No follow-up needed if patient is low-risk. Non-contrast chest CT can be considered in 12 months if patient is high-risk. This recommendation follows the consensus statement: Guidelines for Management of Incidental Pulmonary Nodules Detected on CT Images: From the Fleischner Society 2017; Radiology 2017; 284:228-243. These results will be called to the ordering clinician or representative by the Radiologist Assistant, and communication documented in the PACS or [employer]. Electronically Signed   By: Acquanetta Belling M.D.   On: 01/17/2021 16:37     Assessment and Plan:   Preop cardiovascular evaluation for abdominal aortic aneurysm repair: Given the patient's  comorbidities and especially severe aortic stenosis, she is not a candidate for open surgical repair.  Even endovascular repair will be complex and likely associated with significant risk.  I had a prolonged discussion with the patient about the difficult situation.  The patient understands the risk and she wants to proceed with endovascular repair of abdominal aortic aneurysm if feasible even at a higher risk.  She feels that she did fine post hip surgery.  I discussed with her the option of doing nothing but she is not ready for comfort measures. Severe aortic stenosis: This will have to be addressed by TAVR in the near future. Coronary artery disease involving native coronary arteries without angina: Previous stenting of the right coronary artery.  No cardiac events since then.  Continue medical therapy. Hypotension: The patient has intermittent hypotension.  I think it is reasonable to continue using midodrine.  She is not on any antihypertensive medications.     For questions or updates, please contact [facility] Please consult [email or web address] for contact info under    Signed, Lorine Bears, MD  01/18/2021 12:24 PM

## 2021-01-18 NOTE — Progress Notes (Signed)
[facility]  Daily Progress Note   Subjective  -   No complaints today.  Eating lunch.  No major events overnight.  Objective Vitals:   01/17/21 2003 01/18/21 0513 01/18/21 0723 01/18/21 1212  BP: 103/66 112/64 113/67 (!) 88/52  Pulse: 82 88 81 88  Resp: 16 18 18 20   Temp: 97.8 F (36.6 C) 98.4 F (36.9 C) 98.3 F (36.8 C) 98.3 F (36.8 C)  TempSrc: Oral Oral  Oral  SpO2: 96% (!) 84% 94% 96%  Weight:      Height:        Intake/Output Summary (Last 24 hours) at 01/18/2021 1342 Last data filed at 01/18/2021 0430 Gross per 24 hour  Intake 1050.65 ml  Output 1850 ml  Net -799.35 ml    PULM  CTAB CV  RRR VASC  increased aortic impulse.  Laboratory CBC    Component Value Date/Time   WBC 9.3 01/18/2021 0417   HGB 11.2 (L) 01/18/2021 0417   HCT 37.3 01/18/2021 0417   PLT 276 01/18/2021 0417    BMET    Component Value Date/Time   NA 134 (L) 01/18/2021 0417   K 4.3 01/18/2021 0417   CL 103 01/18/2021 0417   CO2 23 01/18/2021 0417   GLUCOSE 119 (H) 01/18/2021 0417   BUN 21 01/18/2021 0417   CREATININE 0.91 01/18/2021 0417   CALCIUM 8.6 (L) 01/18/2021 0417   GFRNONAA >60 01/18/2021 0417   GFRAA >60 05/02/2018 0336    Assessment/Planning:  Greater than 7.5 cm abdominal aortic aneurysm.  Needs repair in the near future.  He has high risk, but this is a very large aneurysm with a very high risk of rupture.  Will require renal snorkeling as she is not a candidate for open repair. Hip abscess.  Has been drained.  Want to make sure she is is infection free as possible before proceeding with endovascular repair because stent graft infection would be a lethal problem for her Severe aortic stenosis.  Will need to have TAVR at some point in the future Coronary disease.  Appreciated cardiology input and preoperative planning   05/04/2018  01/18/2021, 1:42 PM

## 2021-01-18 NOTE — Care Management Important Message (Signed)
Important Message  Patient Details  Name: Monica [name] MRN: [id number] Date of Birth: Dec 15, 1933   Medicare Important Message Given:  Yes  S  [place] A Our[place] Hamler 01/18/2021, 11:12 AM

## 2021-01-18 NOTE — Progress Notes (Signed)
PT Cancellation Note  Patient Details Name: Monica [name] MRN: [id number] DOB: 12-24-1933   Cancelled Treatment:    Reason Eval/Treat Not Completed: Patient not medically ready.  Attempted to see pt at 1026 this morning but pt reporting 8/10 hip pain and requesting pain medication first: nurse notified and therapist returned later after pain medication given.  Returned 1138 and pt agreeable to therapy with encouragement.  NT already present obtaining vitals and pt's BP noted to be 86/62 and 88/54; pt's O2 sats also noted to be in low 80's on 3 L O2 via nasal cannula (pt cued for purse lip breathing which improved pt's O2 sats but pt unable to maintain purse lip breathing and would switch to mouth breathing and pt's O2 sats would then decrease again).  NT immediately notified pt's nurse regarding O2 and BP concerns:  Pt's NT and nurse then present with pt addressing above noted concerns.  Will re-attempt PT evaluation at a later date/time as medically appropriate.  Hendricks Limes, PT 01/18/21, 12:10 PM

## 2021-01-18 NOTE — Progress Notes (Signed)
Nurse aware of patients low BP.

## 2021-01-18 NOTE — Progress Notes (Signed)
[facility] PROGRESS NOTE    Monica [name]  ZOX:[id number] DOB: 01-11-1934 DOA: 01/15/2021 PCP: Patient, No Pcp Per (Inactive)      Brief Narrative:  Monica [name] is a 87 y.o. F with CAD s/p PCI x1 in 2018, severe AS being worked up for TAVR, Obesity, pHTN, and COPD who presented with hip pain.  Found to have infection at site of her feb hip repair.  Also noted incidentally to have large AAA.         Assessment & Plan:  Postsurgical hip infection Status postdebridement 6/7 by orthopedics, Dr. Posey Pronto There is no overt evidence of bone involvement during incision and drainage.  On the whole, deeper infection is doubted.  Culture data: 6/6 blood culture x2: 1/2 with staph epi 6/7 intraoperative cultures x3: ngtd 6/8 blood culture x2: ngtd  - Follow repeat blood cultures, surgical cultures and ESR, CRP - Consult infectious disease - Continue vancomycin and cefepime    Positive blood culture 1/2 admission cultures positive for staph epidermidis.    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 I agree, the patient is a poor candidate for open repair.  The feasibility of endovascular repair is being explored.  If possible, and if repair is desired by patient, Vascular surgery who recommended that this be expedited within 1 to 2 months, certainly after resolution of infection. - Consult vascular surgery    Severe aortic stenosis Chronic diastolic CHF Pulmonary hypertension Appears euvolemic at present, volume status difficult to estimate due to obesity - Consult cardiology, appreciate recommendations - Continue torsemide  Coronary artery disease, secondary prevention - Continue Crestor  Obesity BMI 39  Hypotension due to AS - Consult Cardiology - Continue midodrine  Chronic hypoxic respiratory failure due to aortic stenosis, heart failure, and pulmonary hypertension  COPD Asymptomatic, no  flare  Hyponatremia Mild, asymptomatic  Microcytic anemia - Check iron           Disposition: Status is: Inpatient  Remains inpatient appropriate because:Ongoing diagnostic testing needed not appropriate for outpatient work up   Dispo: The patient is from: Home              Anticipated d/c is to: SNF              Patient currently is not medically stable to d/c.   Difficult to place patient No       Level of care: Med-Surg       MDM: The below labs and imaging reports were reviewed and summarized above.  Medication management as above.    DVT prophylaxis: enoxaparin (LOVENOX) injection 40 mg Start: 01/17/21 0800 SCDs Start: 01/16/21 1611  Code Status: FULL Family Communication: Grandson at bedside           Subjective: No fever overnight, no chest pain, dyspnea.  No cough or sputum.  No confusion.  Still somewhat nauseated.  Hip pain is improving.  Objective: Vitals:   01/17/21 1553 01/17/21 2003 01/18/21 0513 01/18/21 0723  BP: (!) 88/52 103/66 112/64 113/67  Pulse: 77 82 88 81  Resp: 15 16 18 18   Temp: 97.6 F (36.4 C) 97.8 F (36.6 C) 98.4 F (36.9 C) 98.3 F (36.8 C)  TempSrc: Oral Oral Oral   SpO2: 93% 96% (!) 84% 94%  Weight:      Height:        Intake/Output Summary (Last 24 hours) at 01/18/2021 1000 Last data filed at 01/18/2021 0430 Gross per  24 hour  Intake 1410.65 ml  Output 1850 ml  Net -439.35 ml   Filed Weights   01/15/21 1638  Weight: 106.6 kg    Examination: General appearance: Elderly adult female, lying in bed, appears tired     HEENT: Anicteric, conjunctival pink, lids and lashes normal.  No nasal deformity, discharge, or epistaxis. Skin: Skin warm and dry, no suspicious rashes or lesions. Cardiac: RRR, loud systolic murmur, JVP not visible due to body habitus, I do not appreciate lower extremity edema. Respiratory: Normal respiratory rate and rhythm, lung sounds diminished due to body habitus, no wheezing  that I can tell. Abdomen: Abdomen soft without tenderness palpation or guarding.  I cannot appreciate a pulsatile mass. MSK: Left hip wound with wound VAC, no surrounding cellulitis. Neuro: Awake and alert, extraocular movements intact, moves upper extremities with generalized weakness, symmetric strength, face symmetric, speech fluent. Psych: Sensorium intact responding to questions, attention normal, affect normal, judgment insight appear normal      Data Reviewed: I have personally reviewed following labs and imaging studies:  CBC: Recent Labs  Lab 01/15/21 1643 01/16/21 0410 01/17/21 0449 01/18/21 0417  WBC 10.0 8.8 8.6 9.3  HGB 12.6 11.2* 11.2* 11.2*  HCT 41.5 36.4 36.7 37.3  MCV 77.7* 78.6* 78.3* 78.4*  PLT 316 269 285 701   Basic Metabolic Panel: Recent Labs  Lab 01/15/21 1643 01/16/21 0410 01/17/21 0449 01/18/21 0417  NA 134* 133* 133* 134*  K 4.1 4.3 4.7 4.3  CL 100 102 103 103  CO2 27 25 22 23   GLUCOSE 120* 109* 103* 119*  BUN 16 14 16 21   CREATININE 0.80 0.74 0.94 0.91  CALCIUM 8.5* 8.2* 8.5* 8.6*   GFR: Estimated Creatinine Clearance: 54.8 mL/min (by C-G formula based on SCr of 0.91 mg/dL). Liver Function Tests: Recent Labs  Lab 01/15/21 1643  AST 17  ALT 8  ALKPHOS 66  BILITOT 1.3*  PROT 7.8  ALBUMIN 3.3*   No results for input(s): LIPASE, AMYLASE in the last 168 hours. No results for input(s): AMMONIA in the last 168 hours. Coagulation Profile: No results for input(s): INR, PROTIME in the last 168 hours. Cardiac Enzymes: No results for input(s): CKTOTAL, CKMB, CKMBINDEX, TROPONINI in the last 168 hours. BNP (last 3 results) No results for input(s): PROBNP in the last 8760 hours. HbA1C: No results for input(s): HGBA1C in the last 72 hours. CBG: No results for input(s): GLUCAP in the last 168 hours. Lipid Profile: No results for input(s): CHOL, HDL, LDLCALC, TRIG, CHOLHDL, LDLDIRECT in the last 72 hours. Thyroid Function Tests: No  results for input(s): TSH, T4TOTAL, FREET4, T3FREE, THYROIDAB in the last 72 hours. Anemia Panel: No results for input(s): VITAMINB12, FOLATE, FERRITIN, TIBC, IRON, RETICCTPCT in the last 72 hours. Urine analysis:    Component Value Date/Time   COLORURINE YELLOW (A) 04/29/2018 1357   APPEARANCEUR CLEAR (A) 04/29/2018 1357   LABSPEC 1.010 04/29/2018 1357   PHURINE 7.0 04/29/2018 1357   GLUCOSEU NEGATIVE 04/29/2018 1357   HGBUR SMALL (A) 04/29/2018 1357   BILIRUBINUR NEGATIVE 04/29/2018 1357   KETONESUR 5 (A) 04/29/2018 1357   PROTEINUR NEGATIVE 04/29/2018 1357   NITRITE NEGATIVE 04/29/2018 1357   LEUKOCYTESUR NEGATIVE 04/29/2018 1357   Sepsis Labs: @LABRCNTIP (procalcitonin:4,lacticacidven:4)  ) Recent Results (from the past 240 hour(s))  Blood culture (routine x 2)     Status: Abnormal   Collection Time: 01/15/21  4:43 PM   Specimen: BLOOD LEFT HAND  Result Value Ref Range Status  Specimen Description   Final    BLOOD LEFT HAND Performed at [facility], [address] [place], [place] [address]    Special Requests   Final    BOTTLES DRAWN AEROBIC AND ANAEROBIC Blood Culture adequate volume Performed at [facility], [address] [place], [place] [address]    Culture  Setup Time   Final    Organism ID to follow GRAM POSITIVE COCCI AEROBIC BOTTLE ONLY CRITICAL RESULT CALLED TO, READ BACK BY AND VERIFIED WITH: AMY THOMPSON AT 2458 ON 01/16/2021 [place]. Performed at [facility], [address] [place], [place] [address]    Culture (A)  Final    STAPHYLOCOCCUS EPIDERMIDIS THE SIGNIFICANCE OF ISOLATING THIS ORGANISM FROM A SINGLE SET OF BLOOD CULTURES WHEN MULTIPLE SETS ARE DRAWN IS UNCERTAIN. PLEASE NOTIFY THE MICROBIOLOGY DEPARTMENT WITHIN ONE WEEK IF SPECIATION AND SENSITIVITIES ARE REQUIRED. Performed at [facility], [place] [address], [place], [place] [address]    Report Status 01/18/2021 FINAL  Final  Blood culture (routine x 2)      Status: None (Preliminary result)   Collection Time: 01/15/21  4:43 PM   Specimen: BLOOD  Result Value Ref Range Status   Specimen Description BLOOD BLOOD RIGHT WRIST  Final   Special Requests   Final    BOTTLES DRAWN AEROBIC AND ANAEROBIC Blood Culture results may not be optimal due to an inadequate volume of blood received in culture bottles   Culture   Final    NO GROWTH 3 DAYS Performed at [facility], [address] [place], [place] [address]    Report Status PENDING  Incomplete  Blood Culture ID Panel (Reflexed)     Status: Abnormal   Collection Time: 01/15/21  4:43 PM  Result Value Ref Range Status   Enterococcus faecalis NOT DETECTED NOT DETECTED Final   Enterococcus Faecium NOT DETECTED NOT DETECTED Final   Listeria monocytogenes NOT DETECTED NOT DETECTED Final   Staphylococcus species DETECTED (A) NOT DETECTED Final    Comment: CRITICAL RESULT CALLED TO, READ BACK BY AND VERIFIED WITH: AMY THOMPSON AT 7341 ON 01/16/2021 [place].    Staphylococcus aureus (BCID) NOT DETECTED NOT DETECTED Final   Staphylococcus epidermidis DETECTED (A) NOT DETECTED Final    Comment: Methicillin (oxacillin) resistant coagulase negative staphylococcus. Possible blood culture contaminant (unless isolated from more than one blood culture draw or clinical case suggests pathogenicity). No antibiotic treatment is indicated for blood  culture contaminants. CRITICAL RESULT CALLED TO, READ BACK BY AND VERIFIED WITH: AMY THOMPSON AT 9379 ON 01/16/2021 [place].    Staphylococcus lugdunensis NOT DETECTED NOT DETECTED Final   Streptococcus species NOT DETECTED NOT DETECTED Final   Streptococcus agalactiae NOT DETECTED NOT DETECTED Final   Streptococcus pneumoniae NOT DETECTED NOT DETECTED Final   Streptococcus pyogenes NOT DETECTED NOT DETECTED Final   A.calcoaceticus-baumannii NOT DETECTED NOT DETECTED Final   Bacteroides fragilis NOT DETECTED NOT DETECTED Final   Enterobacterales NOT DETECTED NOT  DETECTED Final   Enterobacter cloacae complex NOT DETECTED NOT DETECTED Final   Escherichia coli NOT DETECTED NOT DETECTED Final   Klebsiella aerogenes NOT DETECTED NOT DETECTED Final   Klebsiella oxytoca NOT DETECTED NOT DETECTED Final   Klebsiella pneumoniae NOT DETECTED NOT DETECTED Final   Proteus species NOT DETECTED NOT DETECTED Final   Salmonella species NOT DETECTED NOT DETECTED Final   Serratia marcescens NOT DETECTED NOT DETECTED Final   Haemophilus influenzae NOT DETECTED NOT DETECTED Final   Neisseria meningitidis  NOT DETECTED NOT DETECTED Final   Pseudomonas aeruginosa NOT DETECTED NOT DETECTED Final   Stenotrophomonas maltophilia NOT DETECTED NOT DETECTED Final   Candida albicans NOT DETECTED NOT DETECTED Final   Candida auris NOT DETECTED NOT DETECTED Final   Candida glabrata NOT DETECTED NOT DETECTED Final   Candida krusei NOT DETECTED NOT DETECTED Final   Candida parapsilosis NOT DETECTED NOT DETECTED Final   Candida tropicalis NOT DETECTED NOT DETECTED Final   Cryptococcus neoformans/gattii NOT DETECTED NOT DETECTED Final   Methicillin resistance mecA/C DETECTED (A) NOT DETECTED Final    Comment: CRITICAL RESULT CALLED TO, READ BACK BY AND VERIFIED WITH: AMY THOMPSON AT 1660 ON 01/16/2021 [place]. Performed at [facility], [place], [place] [address]   Resp Panel by RT-PCR (Flu A&B, Covid) Nasopharyngeal Swab     Status: None   Collection Time: 01/15/21  8:24 PM   Specimen: Nasopharyngeal Swab; Nasopharyngeal(NP) swabs in vial transport medium  Result Value Ref Range Status   SARS Coronavirus 2 by RT PCR NEGATIVE NEGATIVE Final    Comment: (NOTE) SARS-CoV-2 target nucleic acids are NOT DETECTED.  The SARS-CoV-2 RNA is generally detectable in upper respiratory specimens during the acute phase of infection. The lowest concentration of SARS-CoV-2 viral copies this assay can detect is 138 copies/mL. A negative result does not preclude  SARS-Cov-2 infection and should not be used as the sole basis for treatment or other patient management decisions. A negative result may occur with  improper specimen collection/handling, submission of specimen other than nasopharyngeal swab, presence of viral mutation(s) within the areas targeted by this assay, and inadequate number of viral copies(<138 copies/mL). A negative result must be combined with clinical observations, patient history, and epidemiological information. The expected result is Negative.  Fact Sheet for Patients:  EntrepreneurPulse.com.au  Fact Sheet for Healthcare Providers:  IncredibleEmployment.be  This test is no t yet approved or cleared by the [place] FDA and  has been authorized for detection and/or diagnosis of SARS-CoV-2 by FDA under an Emergency Use Authorization (EUA). This EUA will remain  in effect (meaning this test can be used) for the duration of the COVID-19 declaration under Section 564(b)(1) of the Act, 21 U.S.C.section 360bbb-3(b)(1), unless the authorization is terminated  or revoked sooner.       Influenza A by PCR NEGATIVE NEGATIVE Final   Influenza B by PCR NEGATIVE NEGATIVE Final    Comment: (NOTE) The Xpert Xpress SARS-CoV-2/FLU/RSV plus assay is intended as an aid in the diagnosis of influenza from Nasopharyngeal swab specimens and should not be used as a sole basis for treatment. Nasal washings and aspirates are unacceptable for Xpert Xpress SARS-CoV-2/FLU/RSV testing.  Fact Sheet for Patients: EntrepreneurPulse.com.au  Fact Sheet for Healthcare Providers: IncredibleEmployment.be  This test is not yet approved or cleared by the [place] FDA and has been authorized for detection and/or diagnosis of SARS-CoV-2 by FDA under an Emergency Use Authorization (EUA). This EUA will remain in effect (meaning this test can be used) for the duration of  the COVID-19 declaration under Section 564(b)(1) of the Act, 21 U.S.C. section 360bbb-3(b)(1), unless the authorization is terminated or revoked.  Performed at [facility], [address] [place], [place] [address]   Aerobic/Anaerobic Culture w Gram Stain (surgical/deep wound)     Status: None (Preliminary result)   Collection Time: 01/16/21  2:25 PM   Specimen: Wound; Abscess  Result Value Ref Range Status   Specimen Description   Final  WOUND Performed at [facility], [address], [place], [place] [address]    Special Requests   Final    LEFT HIP Performed at [facility], [place], [place] [address]    Gram Stain   Final    RARE WBC PRESENT,BOTH PMN AND MONONUCLEAR NO ORGANISMS SEEN    Culture   Final    CULTURE REINCUBATED FOR BETTER GROWTH Performed at [facility], [place] [address] [place], [place] [address]    Report Status PENDING  Incomplete  Aerobic/Anaerobic Culture w Gram Stain (surgical/deep wound)     Status: None (Preliminary result)   Collection Time: 01/16/21  2:25 PM   Specimen: Wound; Abscess  Result Value Ref Range Status   Specimen Description   Final    WOUND Performed at [facility], [address] [place], [place] [address]    Special Requests LEFT HIP  Final   Gram Stain   Final    FEW WBC PRESENT, PREDOMINANTLY PMN NO ORGANISMS SEEN    Culture   Final    NO GROWTH < 12 HOURS Performed at [facility] [address] [place], [place] [address]    Report Status PENDING  Incomplete  Aerobic/Anaerobic Culture w Gram Stain (surgical/deep wound)     Status: None (Preliminary result)   Collection Time: 01/16/21  2:26 PM   Specimen: Wound; Abscess  Result Value Ref Range Status   Specimen Description   Final    WOUND Performed at [facility], [address], [place], [place] [address]    Special Requests   Final    LEFT HIP Performed at [facility], [place], [place] [address]    Gram Stain NO WBC SEEN NO ORGANISMS SEEN   Final   Culture   Final    NO GROWTH < 12 HOURS Performed at [facility], [place] [address], [place], [place] [address]    Report Status PENDING  Incomplete  CULTURE, BLOOD (ROUTINE X 2) w Reflex to ID Panel     Status: None (Preliminary result)   Collection Time: 01/17/21 12:10 PM   Specimen: BLOOD  Result Value Ref Range Status   Specimen Description BLOOD BLOOD RIGHT HAND  Final   Special Requests   Final    BOTTLES DRAWN AEROBIC AND ANAEROBIC Blood Culture results may not be optimal due to an excessive volume of blood received in culture bottles   Culture   Final    NO GROWTH < 24 HOURS Performed at [facility], [address], [place], [place] [address]    Report Status PENDING  Incomplete  CULTURE, BLOOD (ROUTINE X 2) w Reflex to ID Panel     Status: None (Preliminary result)   Collection Time: 01/17/21 12:13 PM   Specimen: BLOOD LEFT HAND  Result Value Ref Range Status   Specimen Description BLOOD LEFT HAND  Final   Special Requests   Final    BOTTLES DRAWN AEROBIC AND ANAEROBIC Blood Culture adequate volume   Culture   Final    NO GROWTH < 24 HOURS Performed at [facility], [address] [place], [place] [address]    Report Status PENDING  Incomplete         Radiology Studies: CT Angio Chest/Abd/Pel for Dissection W and/or W/WO  Result Date: 01/17/2021 CLINICAL DATA:  Thoracoabdominal aortic dissection follow-up EXAM: CT ANGIOGRAPHY CHEST, ABDOMEN AND PELVIS TECHNIQUE: Non-contrast CT  of the chest was initially obtained. Multidetector CT imaging through the chest, abdomen and pelvis was performed using the standard protocol during bolus administration of intravenous contrast. Multiplanar reconstructed images and MIPs were obtained and reviewed to evaluate the vascular anatomy. CONTRAST:  140m OMNIPAQUE IOHEXOL 350 MG/ML SOLN COMPARISON:  CT left  hip 01/15/2021 CT chest 10/05/2020 FINDINGS: CTA CHEST FINDINGS Cardiovascular: Heart size at upper limits of normal. Coronary calcifications are noted. Dilated pulmonary artery suspicious for pulmonary arterial hypertension. Mediastinum/Nodes: Hilar and mediastinal lymphadenopathy again seen. Lungs/Pleura: No focal airspace opacity to indicate pneumonia. Mild diffuse septal thickening and increased interstitial opacities are similar to prior examination. These findings are nonspecific indicator of inflammation or edema. 5 mm noncalcified nodule in the right upper lobe (image 43, series 100,000) is not significantly changed in size since the prior study. Musculoskeletal: No chest wall abnormality. No acute or significant osseous findings. Review of the MIP images confirms the above findings. CTA ABDOMEN AND PELVIS FINDINGS VASCULAR Aorta: No significant abnormality of the thoracic aorta. There is aneurysmal dilatation of the infrarenal abdominal aorta measuring up to 7.7 x 7.6 cm Celiac: Severe focal stenosis at the origin of the celiac artery which remains patent. SMA: Severe focal stenosis at the origin of the superior mesenteric artery. Renals: Severe stenosis at the origin of the right main renal artery. Mild stenosis at the origin of the left main renal artery. IMA: Occluded at the origin. Distal branches demonstrate opacification indicative of retrograde collateral flow. Inflow: Aneurysmal dilatation of the right common iliac artery measuring up to 2.9 x 2.6 cm. 1.0 cm aneurysm seen adjacent to the right side of the rectum (image 179/series 4). This is likely originating from a rectal branch of the posterior division of the right internal iliac artery. No significant abnormality of the left inflow arteries. Veins: No obvious venous abnormality within the limitations of this arterial phase study. Review of the MIP images confirms the above findings. NON-VASCULAR Hepatobiliary: No focal hepatic lesion.  Cholelithiasis. No bile duct dilatation. Pancreas: Unremarkable. Spleen: Punctate splenic calcifications likely due to prior granulomatous inflammation. Adrenals/Urinary Tract: Adrenal glands normal in appearance. Subcentimeter left renal hypodense lesions are too small to fully characterize. There is suggestion of a solid mass at the lower pole the left kidney measuring 1.2 cm in diameter. Stomach/Bowel: Hyperdense structure in the gastric lumen likely related to ingested medication. The duodenum does not cross midline suggestive of incomplete bowel rotation. No bowel dilatation to indicate ileus or obstruction. Mild distal descending colon diverticulosis without evidence of acute diverticulitis. Appendix is not definitively identified. Lymphatic: No enlarged abdominal or pelvic lymph nodes. Reproductive: Uterus and bilateral adnexa are unremarkable. Other: Small fat containing umbilical hernia. Mild rectus sheath diastasis. Nonspecific focal skin thickening of the right anterior pelvis best seen on image 168 of series 4. Musculoskeletal: Left femur fracture again seen. Proximal half of the fixation hardware of the left femur is seen. Review of the MIP images confirms the above findings. IMPRESSION: 1. Infrarenal abdominal aortic aneurysm measuring up to 7.7 x 7.6 cm. Recommend referral to a vascular specialist. This recommendation follows ACR consensus guidelines: White Paper of the ACR Incidental Findings Committee II on Vascular Findings. J Am Coll Radiol 2013; 10:789-794. 2. 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 3. 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 4. 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 5. Mediastinal and  hilar lymphadenopathy similar to prior examination. This may be due to inflammation or malignancy. 6. Incompletely characterized 1.2 cm mass at the inferior tip of the left kidney should be  further evaluated with contrast enhanced CT or MRI of the abdomen on nonemergent basis. 7. 5 mm right upper lobe pulmonary nodule is unchanged since prior exam. No follow-up needed if patient is low-risk. Non-contrast chest CT can be considered in 12 months if patient is high-risk. This recommendation follows the consensus statement: Guidelines for Management of Incidental Pulmonary Nodules Detected on CT Images: From the Fleischner Society 2017; Radiology 2017; 284:228-243. These results will be called to the ordering clinician or representative by the Radiologist Assistant, and communication documented in the PACS or [employer]. Electronically Signed   By: Miachel Roux M.D.   On: 01/17/2021 16:37         Scheduled Meds:  docusate sodium  100 mg Oral BID   enoxaparin (LOVENOX) injection  40 mg Subcutaneous Q24H   feeding supplement  237 mL Oral BID BM   midodrine  2.5 mg Oral TID WC   multivitamin with minerals  1 tablet Oral Daily   rosuvastatin  10 mg Oral Daily   scopolamine  1 patch Transdermal Q72H   torsemide  10 mg Oral Daily   Continuous Infusions:  ceFEPime (MAXIPIME) IV 2 g (01/17/21 2134)   methocarbamol (ROBAXIN) IV     promethazine (PHENERGAN) injection (IM or IVPB)     vancomycin 1,250 mg (01/17/21 1818)     LOS: 3 days    Time spent: 35  minutes    Edwin Dada, MD [facility] 01/18/2021, 10:00 AM     Please page though [place] or Epic secure chat:  For [employer], Adult nurse

## 2021-01-18 NOTE — Progress Notes (Signed)
Subjective: 2 Days Post-Op Procedure(s) (LRB): IRRIGATION AND DEBRIDEMENT HIP (Left) Patient reports pain as mild this am while in bed.   Patient is having nausea and several episodes of vomiting Plan is to go Home after hospital stay. Negative for chest pain and shortness of breath Fever: no   Objective: Vital signs in last 24 hours: Temp:  [97.6 F (36.4 C)-98.4 F (36.9 C)] 98.3 F (36.8 C) (06/09 0723) Pulse Rate:  [77-88] 81 (06/09 0723) Resp:  [15-20] 18 (06/09 0723) BP: (88-114)/(52-67) 113/67 (06/09 0723) SpO2:  [84 %-96 %] 94 % (06/09 0723)  Intake/Output from previous day:  Intake/Output Summary (Last 24 hours) at 01/18/2021 0948 Last data filed at 01/18/2021 0430 Gross per 24 hour  Intake 1410.65 ml  Output 1850 ml  Net -439.35 ml    Intake/Output this shift: No intake/output data recorded.  Labs: Recent Labs    01/15/21 1643 01/16/21 0410 01/17/21 0449 01/18/21 0417  HGB 12.6 11.2* 11.2* 11.2*   Recent Labs    01/17/21 0449 01/18/21 0417  WBC 8.6 9.3  RBC 4.69 4.76  HCT 36.7 37.3  PLT 285 276   Recent Labs    01/17/21 0449 01/18/21 0417  NA 133* 134*  K 4.7 4.3  CL 103 103  CO2 22 23  BUN 16 21  CREATININE 0.94 0.91  GLUCOSE 103* 119*  CALCIUM 8.5* 8.6*   No results for input(s): LABPT, INR in the last 72 hours.   EXAM General - Patient is Alert, Appropriate and Oriented Extremity - Woundvac intact to the left leg without drainage. Erythema continues to improve Hip is tender but soft to palpation, no additional fluid collection can be palpated. Motor Function - intact, moving foot and toes well on exam.  Negative homans to bilateral lower extremities. Patient is intact to light touch to bilateral legs.  Past Medical History:  Diagnosis Date   Aortic stenosis    a. LHC 06/02/17: At least moderate aortic stenosis with a peak to peak gradient of 22 mmHg; b. TTE 05/2017: EF 55-60%, mild HK basal-midinferior wall, Gr1DD, mod to sev AS  w/ mean gradient 21 mmHg, valve area 0.99, mild MR, mildly dilated LA    CAD (coronary artery disease) 2018   a. inferior STEMI 06/02/2017: LHC 06/02/17: LM 20, D1 20%, o-pLCx 50, p-mRCA 100% s/p PCI/DES, mRCA 50, dRCA 30   GI bleed    a. noted 06/05/2017   HLD (hyperlipidemia)    Obesity     Assessment/Plan: 2 Days Post-Op Procedure(s) (LRB): IRRIGATION AND DEBRIDEMENT HIP (Left) Active Problems:   Abscess of left hip   Abdominal aortic aneurysm (AAA) without rupture (HCC)   Obesity, Class III, BMI 40-49.9 (morbid obesity) (HCC)  Estimated body mass index is 39.11 kg/m as calculated from the following:   Height as of this encounter: 5\' 5"  (1.651 m).   Weight as of this encounter: 106.6 kg. Advance diet Up with therapy D/C IV fluids when tolerating po intake.  Labs reviewed this AM. WBC 9.3.  No recent fevers. Cultures from surgery pending, currently no growth. Gram stain with rare WBC and PMN and mononuclear. ID consulted due to positive blood cultures, currently on Cefepime and Vancomycin.  Continue IV Abx per ID. Moderate nausea this AM, scopolamine patch ordered for the patient. Up with therapy as tolerated.  DVT Prophylaxis - Lovenox and Foot Pumps Weight-Bearing as tolerated to left leg  J. , PA-C [facility] 01/18/2021, 9:48 AM

## 2021-01-19 DIAGNOSIS — I714 Abdominal aortic aneurysm, without rupture: Secondary | ICD-10-CM | POA: Diagnosis not present

## 2021-01-19 DIAGNOSIS — A4902 Methicillin resistant Staphylococcus aureus infection, unspecified site: Secondary | ICD-10-CM

## 2021-01-19 DIAGNOSIS — L02416 Cutaneous abscess of left lower limb: Secondary | ICD-10-CM | POA: Diagnosis not present

## 2021-01-19 DIAGNOSIS — I5022 Chronic systolic (congestive) heart failure: Secondary | ICD-10-CM | POA: Diagnosis not present

## 2021-01-19 LAB — BASIC METABOLIC PANEL
Anion gap: 7 (ref 5–15)
BUN: 19 mg/dL (ref 8–23)
CO2: 28 mmol/L (ref 22–32)
Calcium: 8.7 mg/dL — ABNORMAL LOW (ref 8.9–10.3)
Chloride: 99 mmol/L (ref 98–111)
Creatinine, Ser: 0.97 mg/dL (ref 0.44–1.00)
GFR, Estimated: 57 mL/min — ABNORMAL LOW (ref >60)
Glucose, Bld: 84 mg/dL (ref 70–99)
Potassium: 4.2 mmol/L (ref 3.5–5.1)
Sodium: 134 mmol/L — ABNORMAL LOW (ref 135–145)

## 2021-01-19 LAB — CBC
HCT: 35.3 % — ABNORMAL LOW (ref 36.0–46.0)
Hemoglobin: 10.8 g/dL — ABNORMAL LOW (ref 12.0–15.0)
MCH: 23.8 pg — ABNORMAL LOW (ref 26.0–34.0)
MCHC: 30.6 g/dL (ref 30.0–36.0)
MCV: 77.8 fL — ABNORMAL LOW (ref 80.0–100.0)
Platelets: 279 10*3/uL (ref 150–400)
RBC: 4.54 MIL/uL (ref 3.87–5.11)
RDW: 18.1 % — ABNORMAL HIGH (ref 11.5–15.5)
WBC: 7.9 10*3/uL (ref 4.0–10.5)
nRBC: 0 % (ref 0.0–0.2)

## 2021-01-19 LAB — IRON AND TIBC
Iron: 28 ug/dL (ref 28–170)
Saturation Ratios: 9 % — ABNORMAL LOW (ref 10.4–31.8)
TIBC: 315 ug/dL (ref 250–450)
UIBC: 287 ug/dL

## 2021-01-19 LAB — FERRITIN: Ferritin: 27 ng/mL (ref 11–307)

## 2021-01-19 LAB — C-REACTIVE PROTEIN: CRP: 4.3 mg/dL — ABNORMAL HIGH (ref <1.0)

## 2021-01-19 MED ORDER — CHLORHEXIDINE GLUCONATE CLOTH 2 % EX PADS
6.0000 | MEDICATED_PAD | Freq: Every day | CUTANEOUS | Status: DC
  Administered 2021-01-19 – 2021-01-25 (×5): 6 via TOPICAL

## 2021-01-19 MED ORDER — SODIUM CHLORIDE 0.9 % IV SOLN
510.0000 mg | Freq: Once | INTRAVENOUS | Status: AC
  Administered 2021-01-19: 510 mg via INTRAVENOUS
  Filled 2021-01-19: qty 17

## 2021-01-19 MED ORDER — FLUCONAZOLE 50 MG PO TABS
150.0000 mg | ORAL_TABLET | Freq: Once | ORAL | Status: AC
  Administered 2021-01-19: 150 mg via ORAL
  Filled 2021-01-19: qty 3

## 2021-01-19 MED ORDER — RIFAMPIN 300 MG PO CAPS
300.0000 mg | ORAL_CAPSULE | Freq: Two times a day (BID) | ORAL | Status: DC
  Administered 2021-01-19 – 2021-01-23 (×8): 300 mg via ORAL
  Filled 2021-01-19 (×9): qty 1

## 2021-01-19 MED ORDER — OXYCODONE HCL 5 MG PO TABS: 5.0000 mg | ORAL_TABLET | ORAL | 0 refills | Status: DC | PRN

## 2021-01-19 NOTE — Progress Notes (Signed)
Progress Note  Patient Name: Monica ArmourSylvia [name] Date of Encounter: 01/19/2021  [facility] Cardiologist: Lorine BearsMuhammad Arida, MD   Subjective   Patient overall feeling better today. Has some shortness of breath on 4L O2, not on O2 at home. No chest pain. Bps good on midodrine. Discussed possible upcoming procedures.   Inpatient Medications    Scheduled Meds:  docusate sodium  100 mg Oral BID   enoxaparin (LOVENOX) injection  40 mg Subcutaneous Q24H   feeding supplement  237 mL Oral BID BM   midodrine  2.5 mg Oral TID WC   multivitamin with minerals  1 tablet Oral Daily   rosuvastatin  10 mg Oral Daily   scopolamine  1 patch Transdermal Q72H   torsemide  10 mg Oral Daily   Continuous Infusions:  ferumoxytol     methocarbamol (ROBAXIN) IV     promethazine (PHENERGAN) injection (IM or IVPB)     vancomycin 1,250 mg (01/18/21 2300)   PRN Meds: bisacodyl, HYDROmorphone (DILAUDID) injection, methocarbamol **OR** methocarbamol (ROBAXIN) IV, metoCLOPramide **OR** metoCLOPramide (REGLAN) injection, nitroGLYCERIN, ondansetron **OR** ondansetron (ZOFRAN) IV, oxyCODONE, oxyCODONE, promethazine **OR** promethazine (PHENERGAN) injection (IM or IVPB) **OR** promethazine, senna-docusate, sodium phosphate, traMADol, traZODone   Vital Signs    Vitals:   01/18/21 1647 01/18/21 2034 01/19/21 0413 01/19/21 0735  BP: 109/63 133/74 110/64 113/67  Pulse: 77 89 75 83  Resp: 16 17 16 18   Temp: 97.7 F (36.5 C) 98 F (36.7 C) 98.1 F (36.7 C) 98.1 F (36.7 C)  TempSrc:  Oral Oral Oral  SpO2: 100% 90% 100% 92%  Weight:      Height:        Intake/Output Summary (Last 24 hours) at 01/19/2021 0928 Last data filed at 01/18/2021 1700 Gross per 24 hour  Intake --  Output 850 ml  Net -850 ml   Last 3 Weights 01/15/2021 10/02/2020 05/16/2020  Weight (lbs) 235 lb 240 lb 256 lb  Weight (kg) 106.595 kg 108.863 kg 116.121 kg      Telemetry    N/A - Personally Reviewed  ECG    Will order- Personally  Reviewed  Physical Exam   GEN: No acute distress.   Neck: No JVD Cardiac: RRR, + murmur, rubs, or gallops.  Respiratory: Crackles and wheezing GI: Soft, nontender, non-distended  MS: No edema; No deformity. Neuro:  Nonfocal  Psych: Normal affect   Labs    High Sensitivity Troponin:  No results for input(s): TROPONINIHS in the last 720 hours.    Chemistry Recent Labs  Lab 01/15/21 1643 01/16/21 0410 01/17/21 0449 01/18/21 0417 01/19/21 0416  NA 134*   < > 133* 134* 134*  K 4.1   < > 4.7 4.3 4.2  CL 100   < > 103 103 99  CO2 27   < > 22 23 28   GLUCOSE 120*   < > 103* 119* 84  BUN 16   < > 16 21 19   CREATININE 0.80   < > 0.94 0.91 0.97  CALCIUM 8.5*   < > 8.5* 8.6* 8.7*  PROT 7.8  --   --   --   --   ALBUMIN 3.3*  --   --   --   --   AST 17  --   --   --   --   ALT 8  --   --   --   --   ALKPHOS 66  --   --   --   --  BILITOT 1.3*  --   --   --   --   GFRNONAA >60   < > 59* >60 57*  ANIONGAP 7   < > 8 8 7    < > = values in this interval not displayed.     Hematology Recent Labs  Lab 01/17/21 0449 01/18/21 0417 01/19/21 0416  WBC 8.6 9.3 7.9  RBC 4.69 4.76 4.54  HGB 11.2* 11.2* 10.8*  HCT 36.7 37.3 35.3*  MCV 78.3* 78.4* 77.8*  MCH 23.9* 23.5* 23.8*  MCHC 30.5 30.0 30.6  RDW 18.2* 17.9* 18.1*  PLT 285 276 279    BNPNo results for input(s): BNP, PROBNP in the last 168 hours.   DDimer No results for input(s): DDIMER in the last 168 hours.   Radiology    CT Angio Chest/Abd/Pel for Dissection W and/or W/WO  Result Date: 01/17/2021 CLINICAL DATA:  Thoracoabdominal aortic dissection follow-up EXAM: CT ANGIOGRAPHY CHEST, ABDOMEN AND PELVIS TECHNIQUE: Non-contrast CT of the chest was initially obtained. Multidetector CT imaging through the chest, abdomen and pelvis was performed using the standard protocol during bolus administration of intravenous contrast. Multiplanar reconstructed images and MIPs were obtained and reviewed to evaluate the vascular anatomy.  CONTRAST:  03/19/2021 OMNIPAQUE IOHEXOL 350 MG/ML SOLN COMPARISON:  CT left hip 01/15/2021 CT chest 10/05/2020 FINDINGS: CTA CHEST FINDINGS Cardiovascular: Heart size at upper limits of normal. Coronary calcifications are noted. Dilated pulmonary artery suspicious for pulmonary arterial hypertension. Mediastinum/Nodes: Hilar and mediastinal lymphadenopathy again seen. Lungs/Pleura: No focal airspace opacity to indicate pneumonia. Mild diffuse septal thickening and increased interstitial opacities are similar to prior examination. These findings are nonspecific indicator of inflammation or edema. 5 mm noncalcified nodule in the right upper lobe (image 43, series 100,000) is not significantly changed in size since the prior study. Musculoskeletal: No chest wall abnormality. No acute or significant osseous findings. Review of the MIP images confirms the above findings. CTA ABDOMEN AND PELVIS FINDINGS VASCULAR Aorta: No significant abnormality of the thoracic aorta. There is aneurysmal dilatation of the infrarenal abdominal aorta measuring up to 7.7 x 7.6 cm Celiac: Severe focal stenosis at the origin of the celiac artery which remains patent. SMA: Severe focal stenosis at the origin of the superior mesenteric artery. Renals: Severe stenosis at the origin of the right main renal artery. Mild stenosis at the origin of the left main renal artery. IMA: Occluded at the origin. Distal branches demonstrate opacification indicative of retrograde collateral flow. Inflow: Aneurysmal dilatation of the right common iliac artery measuring up to 2.9 x 2.6 cm. 1.0 cm aneurysm seen adjacent to the right side of the rectum (image 179/series 4). This is likely originating from a rectal branch of the posterior division of the right internal iliac artery. No significant abnormality of the left inflow arteries. Veins: No obvious venous abnormality within the limitations of this arterial phase study. Review of the MIP images confirms the above  findings. NON-VASCULAR Hepatobiliary: No focal hepatic lesion. Cholelithiasis. No bile duct dilatation. Pancreas: Unremarkable. Spleen: Punctate splenic calcifications likely due to prior granulomatous inflammation. Adrenals/Urinary Tract: Adrenal glands normal in appearance. Subcentimeter left renal hypodense lesions are too small to fully characterize. There is suggestion of a solid mass at the lower pole the left kidney measuring 1.2 cm in diameter. Stomach/Bowel: Hyperdense structure in the gastric lumen likely related to ingested medication. The duodenum does not cross midline suggestive of incomplete bowel rotation. No bowel dilatation to indicate ileus or obstruction. Mild distal descending colon diverticulosis without  evidence of acute diverticulitis. Appendix is not definitively identified. Lymphatic: No enlarged abdominal or pelvic lymph nodes. Reproductive: Uterus and bilateral adnexa are unremarkable. Other: Small fat containing umbilical hernia. Mild rectus sheath diastasis. Nonspecific focal skin thickening of the right anterior pelvis best seen on image 168 of series 4. Musculoskeletal: Left femur fracture again seen. Proximal half of the fixation hardware of the left femur is seen. Review of the MIP images confirms the above findings. IMPRESSION: 1. Infrarenal abdominal aortic aneurysm measuring up to 7.7 x 7.6 cm. Recommend referral to a vascular specialist. This recommendation follows ACR consensus guidelines: White Paper of the ACR Incidental Findings Committee II on Vascular Findings. J Am Coll Radiol 2013; 10:789-794. 2. 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 3. 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 4. 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 5. Mediastinal and hilar lymphadenopathy similar to prior examination. This may be due to inflammation or malignancy. 6. Incompletely  characterized 1.2 cm mass at the inferior tip of the left kidney should be further evaluated with contrast enhanced CT or MRI of the abdomen on nonemergent basis. 7. 5 mm right upper lobe pulmonary nodule is unchanged since prior exam. No follow-up needed if patient is low-risk. Non-contrast chest CT can be considered in 12 months if patient is high-risk. This recommendation follows the consensus statement: Guidelines for Management of Incidental Pulmonary Nodules Detected on CT Images: From the Fleischner Society 2017; Radiology 2017; 284:228-243. These results will be called to the ordering clinician or representative by the Radiologist Assistant, and communication documented in the PACS or [employer]. Electronically Signed   By: Acquanetta Belling M.D.   On: 01/17/2021 16:37    Cardiac Studies   Echo 10/03/2020 1. Left ventricular ejection fraction, by estimation, is 60 to 65%. The  left ventricle has normal function. Left ventricular endocardial border  not optimally defined to evaluate regional wall motion. There is mild left  ventricular hypertrophy. Left  ventricular diastolic parameters are consistent with Grade I diastolic  dysfunction (impaired relaxation). Elevated left atrial pressure.   2. Right ventricular systolic function is normal. The right ventricular  size is mildly enlarged. Tricuspid regurgitation signal is inadequate for  assessing PA pressure.   3. Left atrial size was mildly dilated.   4. Right atrial size was mildly dilated.   5. The mitral valve is degenerative. Trivial mitral valve regurgitation.  Mild mitral stenosis. Moderate mitral annular calcification.   6. Tricuspid valve regurgitation at least moderate but suboptimally  visualized.   7. The aortic valve is tricuspid. There is moderate calcification of the  aortic valve. There is severe thickening of the aortic valve. Aortic valve  regurgitation is not visualized. Severe aortic valve stenosis. Aortic  valve  area, by VTI measures 0.51  cm. Aortic valve mean gradient measures 60.0 mmHg. Aortic valve Vmax  measures 4.83 m/s.   8. The inferior vena cava is normal in size with greater than 50%  respiratory variability, suggesting right atrial pressure of 3 mmHg.   Comparison(s): Very severe aortic stenosis, worsened compared with prior  echocardiogram from 10/26/2019.    Cardiac cath 2018  Mid RCA lesion, 50 %stenosed. Dist RCA lesion, 30 %stenosed. LM lesion, 20 %stenosed. Ost 1st Diag to 1st Diag lesion, 20 %stenosed. Ost Cx to Prox Cx lesion, 50 %stenosed. A drug eluting stent was successfully placed. Prox RCA  to Mid RCA lesion, 100 %stenosed. Post intervention, there is a 0% residual stenosis.   1. Severe one-vessel coronary artery disease with thrombotic occlusion of the proximal and mid right coronary artery. Moderate ostial left circumflex stenosis and mild LAD disease. 2. At least moderate aortic stenosis with a peak to peak gradient of 22 mmHg. Normal left ventricular end-diastolic pressure. 3. Successful angioplasty and drug-eluting stent placement to the proximal right coronary artery extending into the midsegment. 4. Severe hypotension and bradycardia after opening the right coronary artery  likely due to neurohormonal reflects and required treatment with atropine and norepinephrine drip which was discontinued at the end of the case.   Recommendations: Dual antiplatelet therapy for at least one year. Aggressive treatment of risk factors and smoking cessation. Obtain an echocardiogram to evaluate aortic stenosis and LV systolic function. Avoid catheterization via the right radial artery in the future due to right radial loop. Patient Profile     87 y.o. female with hx of CAD with previous inferior STEMI in October 2018 complicated by hypotension and bradcyardia s/p PCI and DES placement to proximal RCA, severe AS, GIB 2018, previous tobacco use, HLD who is being seen for  cardiovascular pre-op evaluation for abdominal aortic aneurysm.   Assessment & Plan    Pre-op cardiovascular evaluation for abdominal aortic aneurysm repair - Incidental finding by CTA showing abdominal aortic aneurysm greater than 7.5cm, vascular was consulted. Originally admitted for left hip pain, found to have hip abscess being treated with abx - Due to comorbidites and severe AS deemed not a candidate for open surgical repair. Even higher risk for endovascular repair - After a long discussion patient whishes to undergo endovascular repair, however currently treating infection. - per [facility]  Severe AS - Echo 09/2020 showed LVEF 60-65%, mild LVH, G1DD, mildly dilated biatrium, trivial MR, severe AS with mean - will need structural heart team work-up for TAVR  CAD s/p DES RCA in 2018 - No anginal symptoms - Continue statin  Hypotension - continue midodrine 2.5mg  TID  Chronic HFpEF - euvolemic on exam - continue home torsemide 10mg  daily  Hip abscess - s/p I&D - ID following  For questions or updates, please contact [facility] Please consult [email or web address] for contact info under        Signed, Shakinah Navis , PA-C  01/19/2021, 9:28 AM

## 2021-01-19 NOTE — Progress Notes (Signed)
[facility] PROGRESS NOTE    Monica [name]  VHQ:[id number] DOB: 05-08-1934 DOA: 01/15/2021 PCP: Patient, No Pcp Per (Inactive)      Brief Narrative:  Monica [name] is a 87 y.o. F with CAD s/p PCI x1 in 2018, severe AS being worked up for TAVR, Obesity, pHTN, and COPD who presented with hip pain.  Found to have infection at site of her feb hip repair.  Also noted incidentally to have large AAA.         Assessment & Plan:  Postsurgical hip infection S/p Weedman of the left hip wound by Dr. Allena Katz, 6/7   Culture data: 6/6 blood culture x2: 1/2 with staph epi 6/7 intraoperative cultures x3: MRSA in 3/3 6/8 blood culture x2: ngtd  Infectious disease recommended 4 weeks of antibiotic to treat for presumptive prosthetic infection. - Continue vancomycin - Add rifampin - Stop cefepime - Consult infectious disease, appreciate recommendations    Positive blood culture 1/2 admission cultures positive for staph epidermidis.  We suspect this is a contaminant.  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 Patient not candidate for open repair And carefully considering the relative risk of graft infection versus urgency of 8 cm aneurysm repair, I concur with vascular surgery to continue IV antibiotics for 7 days and proceed with repair next Wednesday - Consult vascular surgery    Severe aortic stenosis Chronic diastolic CHF Pulmonary hypertension Volume status difficult to estimate - Consult cardiology, appreciate recommendations - Continue torsemide  Coronary artery disease, secondary prevention - Continue Crestor  Obesity BMI 39  Hypotension due to AS Need to maintain diastolic pressure/coronary  perfusion - Continue midodrine increase to 3 times daily  Chronic hypoxic respiratory failure due to aortic stenosis, heart failure, and pulmonary hypertension  COPD Asymptomatic, no flare  Hyponatremia Mild, asymptomatic  Iron deficiency anemia -Transfusion threshold 8 g/dL -Transfuse iron now         Disposition: Status is: Inpatient  Remains inpatient appropriate because:Ongoing diagnostic testing needed not appropriate for outpatient work up   Dispo: The patient is from: Home              Anticipated d/c is to: SNF              Patient currently is not medically stable to d/c.   Difficult to place patient No       Level of care: Med-Surg       MDM: The below labs and imaging reports were reviewed and summarized above.  Medication management as above.    DVT prophylaxis: enoxaparin (LOVENOX) injection 40 mg Start: 01/17/21 0800 SCDs Start: 01/16/21 1611  Code Status: FULL Family Communication: Granddaughter at bedside           Subjective: No new fever.  She has a lot of nausea, still a lot of pain in her left hip, somewhat out of breath.       Objective: Vitals:   01/18/21 1647 01/18/21 2034 01/19/21 0413 01/19/21 0735  BP: 109/63 133/74 110/64 113/67  Pulse: 77 89 75 83  Resp: Temp: 97.7 F (36.5 C) 98 F (36.7 C) 98.1 F (36.7 C) 98.1 F (36.7 C)  TempSrc:  Oral Oral Oral  SpO2: 100% 90% 100% 92%  Weight:      Height:        Intake/Output Summary (Last 24 hours) at 01/19/2021 1419 Last data filed at 01/19/2021 1418 Gross per 24 hour  Intake 480 ml  Output 850 ml  Net -370 ml   Filed Weights   01/15/21 1638  Weight: 106.6 kg    Examination: General appearance: Elderly female, lying in bed, debilitated     HEENT: Anicteric, conjunctive a pink, lids and lashes normal, no nasal deformity, discharge, or epistaxis Skin:  Cardiac: Tachycardic, loud systolic murmur, no lower extremity edema Respiratory:  Increased respiratory rate and rhythm, lungs sound diminished, no wheezing Abdomen: Abdomen soft without tenderness palpation or guarding, no ascites or distention, no pulsatile mass MSK: Left hip wound with wound VAC, no surrounding cellulitis Neuro: Awake and alert, extraocular movements intact, moves upper extremities with generalized weakness, symmetric strength, leg testing limited by pain, face symmetric, speech fluent Psych: Sensorium intact responding questions, attention normal, affect normal, judgment severe normal      Data Reviewed: I have personally reviewed following labs and imaging studies:  CBC: Recent Labs  Lab 01/15/21 1643 01/16/21 0410 01/17/21 0449 01/18/21 0417 01/19/21 0416  WBC 10.0 8.8 8.6 9.3 7.9  HGB 12.6 11.2* 11.2* 11.2* 10.8*  HCT 41.5 36.4 36.7 37.3 35.3*  MCV 77.7* 78.6* 78.3* 78.4* 77.8*  PLT 316 269 285 276 279   Basic Metabolic Panel: Recent Labs  Lab 01/15/21 1643 01/16/21 0410 01/17/21 0449 01/18/21 0417 01/19/21 0416  NA 134* 133* 133* 134* 134*  K 4.1 4.3 4.7 4.3 4.2  CL 100 102 103 103 99  CO2 27 25 22 23 28   GLUCOSE 120* 109* 103* 119* 84  BUN 16 14 16 21 19   CREATININE 0.80 0.74 0.94 0.91 0.97  CALCIUM 8.5* 8.2* 8.5* 8.6* 8.7*   GFR: Estimated Creatinine Clearance: 51.4 mL/min (by C-G formula based on SCr of 0.97 mg/dL). Liver Function Tests: Recent Labs  Lab 01/15/21 1643  AST 17  ALT 8  ALKPHOS 66  BILITOT 1.3*  PROT 7.8  ALBUMIN 3.3*   No results for input(s): LIPASE, AMYLASE in the last 168 hours. No results for input(s): AMMONIA in the last 168 hours. Coagulation Profile: No results for input(s): INR, PROTIME in the last 168 hours. Cardiac Enzymes: No results for input(s): CKTOTAL, CKMB, CKMBINDEX, TROPONINI in the last 168 hours. BNP (last 3 results) No results for input(s): PROBNP in the last 8760 hours. HbA1C: No results for input(s): HGBA1C in the last 72 hours. CBG: No results for input(s): GLUCAP  in the last 168 hours. Lipid Profile: No results for input(s): CHOL, HDL, LDLCALC, TRIG, CHOLHDL, LDLDIRECT in the last 72 hours. Thyroid Function Tests: No results for input(s): TSH, T4TOTAL, FREET4, T3FREE, THYROIDAB in the last 72 hours. Anemia Panel: Recent Labs    01/19/21 0416  FERRITIN 27  TIBC 315  IRON 28   Urine analysis:    Component Value Date/Time   COLORURINE YELLOW (A) 04/29/2018 1357   APPEARANCEUR CLEAR (A) 04/29/2018 1357   LABSPEC 1.010 04/29/2018 1357   PHURINE 7.0 04/29/2018 1357   GLUCOSEU NEGATIVE 04/29/2018 1357   HGBUR SMALL (A) 04/29/2018 1357   BILIRUBINUR NEGATIVE 04/29/2018 1357   KETONESUR 5 (A) 04/29/2018 1357   PROTEINUR NEGATIVE 04/29/2018 1357   NITRITE NEGATIVE 04/29/2018 1357   LEUKOCYTESUR NEGATIVE 04/29/2018 1357   Sepsis Labs: @LABRCNTIP (procalcitonin:4,lacticacidven:4)  ) Recent Results (from the past 240 hour(s))  Blood culture (  routine x 2)     Status: Abnormal   Collection Time: 01/15/21  4:43 PM   Specimen: BLOOD LEFT HAND  Result Value Ref Range Status   Specimen Description   Final    BLOOD LEFT HAND Performed at [facility], [address] [place], [place] [address]    Special Requests   Final    BOTTLES DRAWN AEROBIC AND ANAEROBIC Blood Culture adequate volume Performed at [facility], [address] [place], [place] [address]    Culture  Setup Time   Final    Organism ID to follow GRAM POSITIVE COCCI AEROBIC BOTTLE ONLY CRITICAL RESULT CALLED TO, READ BACK BY AND VERIFIED WITH: AMY THOMPSON AT [id number] ON 01/16/2021 [facility]. Performed at [facility], [address] [place], [place] [address]    Culture (A)  Final    STAPHYLOCOCCUS EPIDERMIDIS THE SIGNIFICANCE OF ISOLATING THIS ORGANISM FROM A SINGLE SET OF BLOOD CULTURES WHEN MULTIPLE SETS ARE DRAWN IS UNCERTAIN. PLEASE NOTIFY THE MICROBIOLOGY DEPARTMENT WITHIN ONE WEEK IF SPECIATION AND SENSITIVITIES ARE REQUIRED. Performed at [facility], [address], [place], [place] [address]    Report Status 01/18/2021 FINAL  Final  Blood culture (routine x 2)     Status: None (Preliminary result)   Collection Time: 01/15/21  4:43 PM   Specimen: BLOOD  Result Value Ref Range Status   Specimen Description BLOOD BLOOD RIGHT WRIST  Final   Special Requests   Final    BOTTLES DRAWN AEROBIC AND ANAEROBIC Blood Culture results may not be optimal due to an inadequate volume of blood received in culture bottles   Culture   Final    NO GROWTH 3 DAYS Performed at [facility], [address] [place], [place] [address]    Report Status PENDING  Incomplete  Blood Culture ID Panel (Reflexed)     Status: Abnormal   Collection Time: 01/15/21  4:43 PM  Result Value Ref Range Status   Enterococcus faecalis NOT DETECTED NOT DETECTED Final   Enterococcus Faecium NOT DETECTED NOT DETECTED Final   Listeria monocytogenes NOT DETECTED NOT DETECTED Final   Staphylococcus species DETECTED (A) NOT DETECTED Final    Comment: CRITICAL RESULT CALLED TO, READ BACK BY AND VERIFIED WITH: AMY THOMPSON AT 9244 ON 01/16/2021 [facility].    Staphylococcus aureus (BCID) NOT DETECTED NOT DETECTED Final   Staphylococcus epidermidis DETECTED (A) NOT DETECTED Final    Comment: Methicillin (oxacillin) resistant coagulase negative staphylococcus. Possible blood culture contaminant (unless isolated from more than one blood culture draw or clinical case suggests pathogenicity). No antibiotic treatment is indicated for blood  culture contaminants. CRITICAL RESULT CALLED TO, READ BACK BY AND VERIFIED WITH: AMY THOMPSON AT 6286 ON 01/16/2021 [facility].    Staphylococcus lugdunensis NOT DETECTED NOT DETECTED Final   Streptococcus species NOT DETECTED NOT DETECTED Final   Streptococcus agalactiae NOT DETECTED NOT DETECTED Final   Streptococcus pneumoniae NOT DETECTED NOT DETECTED Final   Streptococcus pyogenes NOT DETECTED NOT DETECTED Final    A.calcoaceticus-baumannii NOT DETECTED NOT DETECTED Final   Bacteroides fragilis NOT DETECTED NOT DETECTED Final   Enterobacterales NOT DETECTED NOT DETECTED Final   Enterobacter cloacae complex NOT DETECTED NOT DETECTED Final   Escherichia coli NOT DETECTED NOT DETECTED Final   Klebsiella aerogenes NOT DETECTED NOT DETECTED Final   Klebsiella oxytoca NOT DETECTED NOT DETECTED Final   Klebsiella pneumoniae NOT DETECTED NOT DETECTED Final   Proteus species NOT DETECTED NOT DETECTED Final  Salmonella species NOT DETECTED NOT DETECTED Final   Serratia marcescens NOT DETECTED NOT DETECTED Final   Haemophilus influenzae NOT DETECTED NOT DETECTED Final   Neisseria meningitidis NOT DETECTED NOT DETECTED Final   Pseudomonas aeruginosa NOT DETECTED NOT DETECTED Final   Stenotrophomonas maltophilia NOT DETECTED NOT DETECTED Final   Candida albicans NOT DETECTED NOT DETECTED Final   Candida auris NOT DETECTED NOT DETECTED Final   Candida glabrata NOT DETECTED NOT DETECTED Final   Candida krusei NOT DETECTED NOT DETECTED Final   Candida parapsilosis NOT DETECTED NOT DETECTED Final   Candida tropicalis NOT DETECTED NOT DETECTED Final   Cryptococcus neoformans/gattii NOT DETECTED NOT DETECTED Final   Methicillin resistance mecA/C DETECTED (A) NOT DETECTED Final    Comment: CRITICAL RESULT CALLED TO, READ BACK BY AND VERIFIED WITH: AMY THOMPSON AT [id number] ON 01/16/2021 [facility]. Performed at [facility], [address] [place], [place] [address]   Resp Panel by RT-PCR (Flu A&B, Covid) Nasopharyngeal Swab     Status: None   Collection Time: 01/15/21  8:24 PM   Specimen: Nasopharyngeal Swab; Nasopharyngeal(NP) swabs in vial transport medium  Result Value Ref Range Status   SARS Coronavirus 2 by RT PCR NEGATIVE NEGATIVE Final    Comment: (NOTE) SARS-CoV-2 target nucleic acids are NOT DETECTED.  The SARS-CoV-2 RNA is generally detectable in upper respiratory specimens during the acute phase of  infection. The lowest concentration of SARS-CoV-2 viral copies this assay can detect is 138 copies/mL. A negative result does not preclude SARS-Cov-2 infection and should not be used as the sole basis for treatment or other patient management decisions. A negative result may occur with  improper specimen collection/handling, submission of specimen other than nasopharyngeal swab, presence of viral mutation(s) within the areas targeted by this assay, and inadequate number of viral copies(<138 copies/mL). A negative result must be combined with clinical observations, patient history, and epidemiological information. The expected result is Negative.  Fact Sheet for Patients:  BloggerCourse.comhttps://www.fda.gov/media/152166/download  Fact Sheet for Healthcare Providers:  SeriousBroker.ithttps://www.fda.gov/media/152162/download  This test is no t yet approved or cleared by the [place] FDA and  has been authorized for detection and/or diagnosis of SARS-CoV-2 by FDA under an Emergency Use Authorization (EUA). This EUA will remain  in effect (meaning this test can be used) for the duration of the COVID-19 declaration under Section 564(b)(1) of the Act, 21 U.S.C.section 360bbb-3(b)(1), unless the authorization is terminated  or revoked sooner.       Influenza A by PCR NEGATIVE NEGATIVE Final   Influenza B by PCR NEGATIVE NEGATIVE Final    Comment: (NOTE) The Xpert Xpress SARS-CoV-2/FLU/RSV plus assay is intended as an aid in the diagnosis of influenza from Nasopharyngeal swab specimens and should not be used as a sole basis for treatment. Nasal washings and aspirates are unacceptable for Xpert Xpress SARS-CoV-2/FLU/RSV testing.  Fact Sheet for Patients: BloggerCourse.comhttps://www.fda.gov/media/152166/download  Fact Sheet for Healthcare Providers: SeriousBroker.ithttps://www.fda.gov/media/152162/download  This test is not yet approved or cleared by the [place] FDA and has been authorized for detection and/or diagnosis of SARS-CoV-2  by FDA under an Emergency Use Authorization (EUA). This EUA will remain in effect (meaning this test can be used) for the duration of the COVID-19 declaration under Section 564(b)(1) of the Act, 21 U.S.C. section 360bbb-3(b)(1), unless the authorization is terminated or revoked.  Performed at [facility], [address] [place], [place] [address]   Aerobic/Anaerobic Culture w Gram Stain (surgical/deep wound)     Status: None (Preliminary  result)   Collection Time: 01/16/21  2:25 PM   Specimen: Wound; Abscess  Result Value Ref Range Status   Specimen Description   Final    WOUND Performed at [facility], [address] [place], [place] [address]    Special Requests   Final    LEFT HIP Performed at [facility], [address] [place], [place] [address]    Gram Stain   Final    RARE WBC PRESENT,BOTH PMN AND MONONUCLEAR NO ORGANISMS SEEN Performed at [facility], [address] [place], [place] [address]    Culture   Final    RARE METHICILLIN RESISTANT STAPHYLOCOCCUS AUREUS NO ANAEROBES ISOLATED; CULTURE IN PROGRESS FOR 5 DAYS    Report Status PENDING  Incomplete   Organism ID, Bacteria METHICILLIN RESISTANT STAPHYLOCOCCUS AUREUS  Final      Susceptibility   Methicillin resistant staphylococcus aureus - MIC*    CIPROFLOXACIN 4 RESISTANT Resistant     ERYTHROMYCIN >=8 RESISTANT Resistant     GENTAMICIN <=0.5 SENSITIVE Sensitive     OXACILLIN >=4 RESISTANT Resistant     TETRACYCLINE <=1 SENSITIVE Sensitive     VANCOMYCIN <=0.5 SENSITIVE Sensitive     TRIMETH/SULFA <=10 SENSITIVE Sensitive     CLINDAMYCIN <=0.25 SENSITIVE Sensitive     RIFAMPIN <=0.5 SENSITIVE Sensitive     Inducible Clindamycin NEGATIVE Sensitive     * RARE METHICILLIN RESISTANT STAPHYLOCOCCUS AUREUS  Aerobic/Anaerobic Culture w Gram Stain (surgical/deep wound)     Status: None (Preliminary result)   Collection Time: 01/16/21  2:25 PM   Specimen: Wound; Abscess   Result Value Ref Range Status   Specimen Description   Final    WOUND Performed at [facility], [address], [place], [place] [address]    Special Requests LEFT HIP  Final   Gram Stain   Final    FEW WBC PRESENT, PREDOMINANTLY PMN NO ORGANISMS SEEN Performed at [facility], [address], [place], [place] [address]    Culture   Final    RARE METHICILLIN RESISTANT STAPHYLOCOCCUS AUREUS NO ANAEROBES ISOLATED; CULTURE IN PROGRESS FOR 5 DAYS    Report Status PENDING  Incomplete   Organism ID, Bacteria METHICILLIN RESISTANT STAPHYLOCOCCUS AUREUS  Final      Susceptibility   Methicillin resistant staphylococcus aureus - MIC*    CIPROFLOXACIN 4 RESISTANT Resistant     ERYTHROMYCIN >=8 RESISTANT Resistant     GENTAMICIN <=0.5 SENSITIVE Sensitive     OXACILLIN >=4 RESISTANT Resistant     TETRACYCLINE <=1 SENSITIVE Sensitive     VANCOMYCIN <=0.5 SENSITIVE Sensitive     TRIMETH/SULFA <=10 SENSITIVE Sensitive     CLINDAMYCIN <=0.25 SENSITIVE Sensitive     RIFAMPIN <=0.5 SENSITIVE Sensitive     Inducible Clindamycin NEGATIVE Sensitive     * RARE METHICILLIN RESISTANT STAPHYLOCOCCUS AUREUS  Aerobic/Anaerobic Culture w Gram Stain (surgical/deep wound)     Status: None (Preliminary result)   Collection Time: 01/16/21  2:26 PM   Specimen: Wound; Abscess  Result Value Ref Range Status   Specimen Description   Final    WOUND Performed at [facility], [address] [place], [place] [address]    Special Requests   Final    LEFT HIP Performed at [facility], [address] [place], [place] [address]    Gram Stain   Final    NO WBC SEEN NO ORGANISMS SEEN Performed at [facility], [address] [place], [place] [address]    Culture   Final    RARE METHICILLIN RESISTANT STAPHYLOCOCCUS AUREUS NO ANAEROBES ISOLATED; CULTURE IN PROGRESS FOR 5 DAYS    Report Status PENDING  Incomplete   Organism ID, Bacteria METHICILLIN RESISTANT  STAPHYLOCOCCUS AUREUS  Final      Susceptibility   Methicillin resistant staphylococcus aureus - MIC*    CIPROFLOXACIN 4 RESISTANT Resistant     ERYTHROMYCIN >=8 RESISTANT Resistant     GENTAMICIN <=0.5 SENSITIVE Sensitive     OXACILLIN >=4 RESISTANT Resistant     TETRACYCLINE <=1 SENSITIVE Sensitive     VANCOMYCIN 1 SENSITIVE Sensitive     TRIMETH/SULFA <=10 SENSITIVE Sensitive     CLINDAMYCIN <=0.25 SENSITIVE Sensitive     RIFAMPIN <=0.5 SENSITIVE Sensitive     Inducible Clindamycin NEGATIVE Sensitive     * RARE METHICILLIN RESISTANT STAPHYLOCOCCUS AUREUS  CULTURE, BLOOD (ROUTINE X 2) w Reflex to ID Panel     Status: None (Preliminary result)   Collection Time: 01/17/21 12:10 PM   Specimen: BLOOD  Result Value Ref Range Status   Specimen Description BLOOD BLOOD RIGHT HAND  Final   Special Requests   Final    BOTTLES DRAWN AEROBIC AND ANAEROBIC Blood Culture results may not be optimal due to an excessive volume of blood received in culture bottles   Culture   Final    NO GROWTH < 24 HOURS Performed at [facility], [address] [place], [place] [address]    Report Status PENDING  Incomplete  CULTURE, BLOOD (ROUTINE X 2) w Reflex to ID Panel     Status: None (Preliminary result)   Collection Time: 01/17/21 12:13 PM   Specimen: BLOOD LEFT HAND  Result Value Ref Range Status   Specimen Description BLOOD LEFT HAND  Final   Special Requests   Final    BOTTLES DRAWN AEROBIC AND ANAEROBIC Blood Culture adequate volume   Culture   Final    NO GROWTH < 24 HOURS Performed at [facility], [address], [place], [place] [address]    Report Status PENDING  Incomplete         Radiology Studies: No results found.       Scheduled Meds:  docusate sodium  100 mg Oral BID   enoxaparin (LOVENOX) injection  40 mg Subcutaneous Q24H   feeding supplement  237 mL Oral BID BM   midodrine  2.5 mg Oral TID WC   multivitamin with minerals  1 tablet Oral Daily    rifampin  300 mg Oral BID   rosuvastatin  10 mg Oral Daily   scopolamine  1 patch Transdermal Q72H   torsemide  10 mg Oral Daily   Continuous Infusions:  methocarbamol (ROBAXIN) IV     promethazine (PHENERGAN) injection (IM or IVPB)     vancomycin 1,250 mg (01/18/21 2300)     LOS: 4 days    Time spent: 35 minutes    Alberteen Sam, MD [facility] 01/19/2021, 2:19 PM     Please page though AMION or Epic secure chat:  For [employer], Higher education careers adviser

## 2021-01-19 NOTE — Progress Notes (Signed)
Subjective: 3 Days Post-Op Procedure(s) (LRB): IRRIGATION AND DEBRIDEMENT HIP (Left) Patient reports pain as mild this am while in bed.   Patient is reporting that her N/V have improved. Plan is to go Home after hospital stay. Negative for chest pain and shortness of breath Fever: no  Objective: Vital signs in last 24 hours: Temp:  [97.7 F (36.5 C)-98.3 F (36.8 C)] 98.1 F (36.7 C) (06/10 0413) Pulse Rate:  [75-89] 75 (06/10 0413) Resp:  [16-20] 16 (06/10 0413) BP: (88-133)/(52-74) 110/64 (06/10 0413) SpO2:  [90 %-100 %] 100 % (06/10 0413)  Intake/Output from previous day:  Intake/Output Summary (Last 24 hours) at 01/19/2021 0752 Last data filed at 01/18/2021 1700 Gross per 24 hour  Intake --  Output 850 ml  Net -850 ml    Intake/Output this shift: No intake/output data recorded.  Labs: Recent Labs    01/17/21 0449 01/18/21 0417 01/19/21 0416  HGB 11.2* 11.2* 10.8*   Recent Labs    01/18/21 0417 01/19/21 0416  WBC 9.3 7.9  RBC 4.76 4.54  HCT 37.3 35.3*  PLT 276 279   Recent Labs    01/18/21 0417 01/19/21 0416  NA 134* 134*  K 4.3 4.2  CL 103 99  CO2 23 28  BUN 21 19  CREATININE 0.91 0.97  GLUCOSE 119* 84  CALCIUM 8.6* 8.7*   No results for input(s): LABPT, INR in the last 72 hours.  EXAM General - Patient is Alert, Appropriate and Oriented Extremity - Woundvac intact to the left leg without drainage. No significant erythema noted to the left hip this AM. Hip is tender but soft to palpation, no additional fluid collection can be palpated. Motor Function - intact, moving foot and toes well on exam.  Negative homans to bilateral lower extremities. Patient is intact to light touch to bilateral legs.  Past Medical History:  Diagnosis Date   Aortic stenosis    a. LHC 06/02/17: At least moderate aortic stenosis with a peak to peak gradient of 22 mmHg; b. TTE 05/2017: EF 55-60%, mild HK basal-midinferior wall, Gr1DD, mod to sev AS w/ mean gradient 21  mmHg, valve area 0.99, mild MR, mildly dilated LA    CAD (coronary artery disease) 2018   a. inferior STEMI 06/02/2017: LHC 06/02/17: LM 20, D1 20%, o-pLCx 50, p-mRCA 100% s/p PCI/DES, mRCA 50, dRCA 30   GI bleed    a. noted 06/05/2017   HLD (hyperlipidemia)    Obesity     Assessment/Plan: 3 Days Post-Op Procedure(s) (LRB): IRRIGATION AND DEBRIDEMENT HIP (Left) Active Problems:   Abscess of left hip   Abdominal aortic aneurysm (AAA) without rupture (HCC)   Obesity, Class III, BMI 40-49.9 (morbid obesity) (HCC)  Estimated body mass index is 39.11 kg/m as calculated from the following:   Height as of this encounter: 5\' 5"  (1.651 m).   Weight as of this encounter: 106.6 kg. Advance diet Up with therapy D/C IV fluids when tolerating po intake.  Labs reviewed this AM. WBC 7.9.  No recent fevers. Cultures from surgery demonstrated staph aureus.  ID recommending IV Abx, continue on Vancomycin until sensitivities return.  Possible PICC line placement. Patient with aortic aneurysm, vascular following, recommend surgery. Up with therapy as tolerated. Nausea has improved this AM.  DVT Prophylaxis - Lovenox and Foot Pumps Weight-Bearing as tolerated to left leg  J. , PA-C [facility] Orthopaedic Surgery 01/19/2021, 7:52 AM

## 2021-01-19 NOTE — Progress Notes (Signed)
Date of Admission:  01/15/2021     ID: Monica [name] is a 87 y.o. female  Active Problems:   Abscess of left hip   Abdominal aortic aneurysm (AAA) without rupture (HCC)   Obesity, Class III, BMI 40-49.9 (morbid obesity) (HCC)    Subjective: She is doing okay No fever Pain left hip improved  Medications:   docusate sodium  100 mg Oral BID   enoxaparin (LOVENOX) injection  40 mg Subcutaneous Q24H   feeding supplement  237 mL Oral BID BM   midodrine  2.5 mg Oral TID WC   multivitamin with minerals  1 tablet Oral Daily   rosuvastatin  10 mg Oral Daily   scopolamine  1 patch Transdermal Q72H   torsemide  10 mg Oral Daily    Objective: Vital signs in last 24 hours: Temp:  [97.7 F (36.5 C)-98.1 F (36.7 C)] 98.1 F (36.7 C) (06/10 0735) Pulse Rate:  [75-89] 83 (06/10 0735) Resp:  [16-18] 18 (06/10 0735) BP: (109-133)/(63-74) 113/67 (06/10 0735) SpO2:  [90 %-100 %] 92 % (06/10 0735)  PHYSICAL EXAM:  General: Alert, cooperative, no distress,  Lungs: Clear to auscultation bilaterally. No Wheezing or Rhonchi. No rales. Heart: Regular rate and rhythm, no murmur, rub or gallop.systolic murmur Abdomen: Soft, non-tender,not distended. Bowel sounds normal. No masses Extremities: left hip laterally and upper hip- wound vac present- erythema better Skin: intertrigo under breasts and groin Lymph: Cervical, supraclavicular normal. Neurologic: Grossly non-focal  Lab Results Recent Labs    01/18/21 0417 01/19/21 0416  WBC 9.3 7.9  HGB 11.2* 10.8*  HCT 37.3 35.3*  NA 134* 134*  K 4.3 4.2  CL 103 99  CO2 23 28  BUN 21 19  CREATININE 0.91 0.97   Liver Panel No results for input(s): PROT, ALBUMIN, AST, ALT, ALKPHOS, BILITOT, BILIDIR, IBILI in the last 72 hours. Sedimentation Rate Recent Labs    01/18/21 0417  ESRSEDRATE 44*   C-Reactive Protein Recent Labs    01/19/21 0416  CRP 4.3*    Microbiology:  Studies/Results: CT Angio Chest/Abd/Pel for Dissection W  and/or W/WO  Result Date: 01/17/2021 CLINICAL DATA:  Thoracoabdominal aortic dissection follow-up EXAM: CT ANGIOGRAPHY CHEST, ABDOMEN AND PELVIS TECHNIQUE: Non-contrast CT of the chest was initially obtained. Multidetector CT imaging through the chest, abdomen and pelvis was performed using the standard protocol during bolus administration of intravenous contrast. Multiplanar reconstructed images and MIPs were obtained and reviewed to evaluate the vascular anatomy. CONTRAST:  OMNIPAQUE IOHEXOL 350 MG/ML SOLN COMPARISON:  CT left hip 01/15/2021 CT chest 10/05/2020 FINDINGS: CTA CHEST FINDINGS Cardiovascular: Heart size at upper limits of normal. Coronary calcifications are noted. Dilated pulmonary artery suspicious for pulmonary arterial hypertension. Mediastinum/Nodes: Hilar and mediastinal lymphadenopathy again seen. Lungs/Pleura: No focal airspace opacity to indicate pneumonia. Mild diffuse septal thickening and increased interstitial opacities are similar to prior examination. These findings are nonspecific indicator of inflammation or edema. 5 mm noncalcified nodule in the right upper lobe (image 43, series 100,000) is not significantly changed in size since the prior study. Musculoskeletal: No chest wall abnormality. No acute or significant osseous findings. Review of the MIP images confirms the above findings. CTA ABDOMEN AND PELVIS FINDINGS VASCULAR Aorta: No significant abnormality of the thoracic aorta. There is aneurysmal dilatation of the infrarenal abdominal aorta measuring up to 7.7 x 7.6 cm Celiac: Severe focal stenosis at the origin of the celiac artery which remains patent. SMA: Severe focal stenosis at the origin of the superior mesenteric  artery. Renals: Severe stenosis at the origin of the right main renal artery. Mild stenosis at the origin of the left main renal artery. IMA: Occluded at the origin. Distal branches demonstrate opacification indicative of retrograde collateral flow. Inflow:  Aneurysmal dilatation of the right common iliac artery measuring up to 2.9 x 2.6 cm. 1.0 cm aneurysm seen adjacent to the right side of the rectum (image 179/series 4). This is likely originating from a rectal branch of the posterior division of the right internal iliac artery. No significant abnormality of the left inflow arteries. Veins: No obvious venous abnormality within the limitations of this arterial phase study. Review of the MIP images confirms the above findings. NON-VASCULAR Hepatobiliary: No focal hepatic lesion. Cholelithiasis. No bile duct dilatation. Pancreas: Unremarkable. Spleen: Punctate splenic calcifications likely due to prior granulomatous inflammation. Adrenals/Urinary Tract: Adrenal glands normal in appearance. Subcentimeter left renal hypodense lesions are too small to fully characterize. There is suggestion of a solid mass at the lower pole the left kidney measuring 1.2 cm in diameter. Stomach/Bowel: Hyperdense structure in the gastric lumen likely related to ingested medication. The duodenum does not cross midline suggestive of incomplete bowel rotation. No bowel dilatation to indicate ileus or obstruction. Mild distal descending colon diverticulosis without evidence of acute diverticulitis. Appendix is not definitively identified. Lymphatic: No enlarged abdominal or pelvic lymph nodes. Reproductive: Uterus and bilateral adnexa are unremarkable. Other: Small fat containing umbilical hernia. Mild rectus sheath diastasis. Nonspecific focal skin thickening of the right anterior pelvis best seen on image 168 of series 4. Musculoskeletal: Left femur fracture again seen. Proximal half of the fixation hardware of the left femur is seen. Review of the MIP images confirms the above findings. IMPRESSION: 1. Infrarenal abdominal aortic aneurysm measuring up to 7.7 x 7.6 cm. Recommend referral to a vascular specialist. This recommendation follows ACR consensus guidelines: White Paper of the ACR  Incidental Findings Committee II on Vascular Findings. J Am Coll Radiol 2013; 10:789-794. 2. 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 3. 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 4. 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 5. Mediastinal and hilar lymphadenopathy similar to prior examination. This may be due to inflammation or malignancy. 6. Incompletely characterized 1.2 cm mass at the inferior tip of the left kidney should be further evaluated with contrast enhanced CT or MRI of the abdomen on nonemergent basis. 7. 5 mm right upper lobe pulmonary nodule is unchanged since prior exam. No follow-up needed if patient is low-risk. Non-contrast chest CT can be considered in 12 months if patient is high-risk. This recommendation follows the consensus statement: Guidelines for Management of Incidental Pulmonary Nodules Detected on CT Images: From the Fleischner Society 2017; Radiology 2017; 284:228-243. These results will be called to the ordering clinician or representative by the Radiologist Assistant, and communication documented in the PACS or [employer]. Electronically Signed   By: Acquanetta Belling M.D.   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 Continue vanco as long as she can tolerate- the other option is daptomycin   Incidental finding of an infrarenal aortic aneurysm measuring > 7  cm high risk for complications Need to weigh the risk of spontaneous rupture VS risk of endovascular  infection If we can safely buy some time then would recommend to treat for atleast 2 weeks before taking  for endovascular surgery. As blood culture is negative for staph aureus , it indicates that it is not a systemic infection and less chance of seeding .  If surgery is emergently needed then atleast 7 days of antibiotics before that may help. Plan to continue vanco for [redacted] weeks along with PO rifampin  Staph epidermidis 1 of 4 is a contaminant.    CAD status post stent   Aortic stenosis  Intertrigo- for topical antifungal  ID will follow her peripherally this weekend. I can be reached on [phone] if needed.

## 2021-01-19 NOTE — Progress Notes (Addendum)
Pharmacy Antibiotic Note  Monica [name] is a 87 y.o. female admitted on 01/15/2021 with cellulitis.  Pharmacy has been consulted for Vancomycin, Cefepime  Dosing.  1 Bloodcx: Staph epi- sens pending (BCID= MRSE?) Wound cx x 3 = MRSA ID following  Vanco levels 6/10 vanco 1250mg  IV given at ___ 6/11 vanco peak = __ at 0130 6/11 vanco trough = __ at 2130  Plan: Vancomycin 1250mg  IV q24h Check levels with 6/10 2200dose Goal AUC 400-600 Plan to add rifampin today per ID for hardware in hip s/p hip fracture repair Monitor renal function Plans pending for AAA repair (endovascular repair)   Height: 5\' 5"  (165.1 cm) Weight: 106.6 kg (235 lb) IBW/kg (Calculated) : 57  Temp (24hrs), Avg:97.9 F (36.6 C), Min:97.7 F (36.5 C), Max:98.1 F (36.7 C)  Recent Labs  Lab 01/15/21 1643 01/16/21 0410 01/17/21 0449 01/18/21 0417 01/19/21 0416  WBC 10.0 8.8 8.6 9.3 7.9  CREATININE 0.80 0.74 0.94 0.91 0.97     Estimated Creatinine Clearance: 51.4 mL/min (by C-G formula based on SCr of 0.97 mg/dL).    No Known Allergies  Antimicrobials this admission: zosyn x1 dose 6/6 cefepime 6/7>>6/9 Vanc 6/6 (evening)>>  Dose adjustments this admission:   Microbiology results:  6/6 BCx: 2of4 (1 set)= GPC=Staph epi, sens pending 6/7 Wound x 3: MRSA 6/8 Bcx: NG  Thank you for allowing pharmacy to be a part of this patient's care.  03/21/21, PharmD, BCPS.   Work Cell: [phone] 01/19/2021 2:00 PM

## 2021-01-19 NOTE — Evaluation (Signed)
Physical Therapy Evaluation Patient Details Name: Monica [name] MRN: [id number] DOB: 1934/02/04 Today's Date: 01/19/2021   History of Present Illness  Pt is an 87 y.o. female presenting to hospital 6/6 for L hip drainage.  Pt admitted with L hip severe cellulitis and likely abscess, L LE moderate cellulitis, and infrarenal aortic artery aneurysm (6.7 cm x6.1 cm with extensive mural thrombus).  S/p L hip wound I&D and excisional debridement of muscle and fascia 6/7.  PMH includes CAD, HLD, obesity, L hip IMN February 2022, aortic stenosis, CAD, GI bleed, acute respiratory failure with hypoxia, STEMI.  Clinical Impression  Prior to hospital admission, pt required some assist with stand pivot transfers hospital bed to/from [facility]; lives with her daughter who assists pt (who pt's family reports is currently in hospital).  Pt's grandson reports pt has a borrowed w/c at home and is not in good condition for pt to use.  Pt always wears wedge shoes d/t foot issues (impaired R DF ROM noted compared to L)--pt's grandson reported he would bring them in this weekend.  Pt and pt's family also reporting pt had relied on L LE for mobility (d/t h/o R LE issues) but when pt had surgery on L hip earlier this year, pt has had more difficulty with mobility d/t needing to rely on R LE instead now (which pt has had difficulties with d/t h/o R LE issues).  Therapist set pt up to sit up on edge of bed but pt's linens noted to be soaked in urine (purewick not working); nurse and NT notified and came to assist with clean-up.  Mod to max assist to logroll to R in bed (extra assist for L LE d/t L hip and L knee pain); vc's for technique.  Pt then also noted to be incontinent of bowel as well.  Nurse and 2 NT's present assisting pt with clean-up upon PT leaving pt's room.  Pt would benefit from skilled PT to address noted impairments and functional limitations (see below for any additional details).  Upon hospital discharge, pt would  benefit from SNF.    Follow Up Recommendations SNF    Equipment Recommendations  3in1 (PT);Wheelchair (measurements PT);Wheelchair cushion (measurements PT);Hospital bed;Other (comment) (hoyer lift)    Recommendations for Other Services       Precautions / Restrictions Precautions Precautions: Fall Restrictions Weight Bearing Restrictions: Yes LLE Weight Bearing: Weight bearing as tolerated      Mobility  Bed Mobility Overal bed mobility: Needs Assistance Bed Mobility: Rolling Rolling: Mod assist;Max assist         General bed mobility comments: vc's for technique; assist for logrolling to R in bed (extra assist for L LE d/t L hip and knee pain)    Transfers                    Ambulation/Gait                Stairs            Wheelchair Mobility    Modified Rankin (Stroke Patients Only)       Balance                                             Pertinent Vitals/Pain Pain Assessment: Faces Faces Pain Scale: Hurts even more Pain Location: L hip and knee Pain Descriptors / Indicators: Grimacing;Tender;Sore  Pain Intervention(s): Limited activity within patient's tolerance;Monitored during session;Repositioned Vitals (HR and O2 on 4 L via nasal cannula) stable and WFL throughout treatment session.    Home Living Family/patient expects to be discharged to:: Private residence Living Arrangements: Children (pt's daughter (who is currently in the hospital)) Available Help at Discharge: Family;Available PRN/intermittently Type of Home: House Home Access: Stairs to enter   Entrance Stairs-Number of Steps: 4+3 Home Layout: One level Home Equipment: Walker - 2 wheels;Cane - single point;Bedside commode;Hospital bed;Wheelchair - manual Additional Comments: Pt's grandson reports pt's manual w/c is from a friend and is not in good condition    Prior Function Level of Independence: Needs assistance   Gait / Transfers  Assistance Needed: Pt ambulatory prior to surgery in February 2022; but since that time pt only able to pivot to [facility] with assist from family (hospital bed to/from [facility]); has not pivoted to w/c at home yet  ADL's / Homemaking Assistance Needed: Per OT eval "States she has a PCA that helps 3x wk with bathing. States her dtr performs most IADLs. States her son runs errands/drives her."  Comments: Pt reports needing specific wedge shoes to perform mobility.     Hand Dominance        Extremity/Trunk Assessment   Upper Extremity Assessment Upper Extremity Assessment: Generalized weakness    Lower Extremity Assessment Lower Extremity Assessment: Generalized weakness;LLE deficits/detail (R DF grossly 15-20 degrees short of neutral (baseline per pt/family)) LLE Deficits / Details: at least 3/5 AROM ankle DF/PF; DF to grossly neutral ROM; limited L hip flexion d/t L hip pain; limited L knee flexion d/t L knee pain LLE: Unable to fully assess due to pain       Communication   Communication: HOH  Cognition Arousal/Alertness: Awake/alert Behavior During Therapy: WFL for tasks assessed/performed Overall Cognitive Status: Within Functional Limits for tasks assessed                                 General Comments: Pt HOH requiring extra vc's at times.      General Comments General comments (skin integrity, edema, etc.): L hip wound vac in place.  Nursing cleared pt for participation in physical therapy.  Pt agreeable to PT session.  Pt's family present during session.     Exercises     Assessment/Plan    PT Assessment Patient needs continued PT services  PT Problem List Decreased strength;Decreased range of motion;Decreased activity tolerance;Decreased mobility;Decreased knowledge of use of DME;Pain;Decreased skin integrity       PT Treatment Interventions DME instruction;Functional mobility training;Therapeutic activities;Therapeutic exercise;Balance  training;Patient/family education    PT Goals (Current goals can be found in the Care Plan section)  Acute Rehab PT Goals Patient Stated Goal: to improve strength and mobility PT Goal Formulation: With patient/family Time For Goal Achievement: 02/02/21 Potential to Achieve Goals: Fair    Frequency Min 2X/week   Barriers to discharge Decreased caregiver support      Co-evaluation               AM-PAC PT "6 Clicks" Mobility  Outcome Measure Help needed turning from your back to your side while in a flat bed without using bedrails?: A Lot Help needed moving from lying on your back to sitting on the side of a flat bed without using bedrails?: A Lot Help needed moving to and from a bed to a chair (including a wheelchair)?: Total Help  needed standing up from a chair using your arms (e.g., wheelchair or bedside chair)?: Total Help needed to walk in hospital room?: Total Help needed climbing 3-5 steps with a railing? : Total 6 Click Score: 8    End of Session Equipment Utilized During Treatment: Oxygen (4 L via nasal cannula) Activity Tolerance: Patient limited by pain Patient left: in bed;Other (comment) (with nurse and 2 NT present assisting pt with clean-up) Nurse Communication: Mobility status;Precautions;Other (comment) (pt needing clean-up) PT Visit Diagnosis: Other abnormalities of gait and mobility (R26.89);Muscle weakness (generalized) (M62.81);Pain Pain - Right/Left: Left Pain - part of body: Hip    Time: 8786-7672 PT Time Calculation (min) (ACUTE ONLY): 31 min   Charges:   PT Evaluation $PT Eval Low Complexity: 1 Low         Taneya Conkel, PT 01/19/21, 1:15 PM

## 2021-01-19 NOTE — NC FL2 (Signed)
[place] MEDICAID FL2 LEVEL OF CARE SCREENING TOOL     IDENTIFICATION  Patient Name: Monica [name] Birthdate: 1934/05/06 Sex: female Admission Date (Current Location): 01/15/2021  [facility] and [place] Number:  Chiropodist and Address:  [facility], [address] [place], [place] [address]      Provider Number: [id number]  Attending Physician Name and Address:  Alberteen Sam, *  Relative Name and Phone Number:       Current Level of Care: Hospital Recommended Level of Care: Skilled Nursing Facility Prior Approval Number:    Date Approved/Denied:   PASRR Number: [id number]  Discharge Plan: SNF    Current Diagnoses: Patient Active Problem List   Diagnosis Date Noted   Abdominal aortic aneurysm (AAA) without rupture (HCC)    Obesity, Class III, BMI 40-49.9 (morbid obesity) (HCC)    Abscess of left hip 01/15/2021   Acute respiratory failure with hypoxia (HCC) 10/05/2020   Severe aortic stenosis    Closed comminuted intertrochanteric fracture of proximal end of left femur (HCC) 10/02/2020   Aortic stenosis    Obesity    Cellulitis of left lower extremity 04/29/2018   GI bleed 06/05/2017   Nausea vomiting and diarrhea    Acute ST elevation myocardial infarction (STEMI) of inferior wall (HCC) 06/02/2017   CAD (coronary artery disease) 2018    Orientation RESPIRATION BLADDER Height & Weight     Self, Time, Situation, Place  O2 (Nasal Canula 2 L) Incontinent, External catheter Weight: 235 lb (106.6 kg) Height:  5\' 5"  (165.1 cm)  BEHAVIORAL SYMPTOMS/MOOD NEUROLOGICAL BOWEL NUTRITION STATUS   (None)  (None) Incontinent Diet (Regular)  AMBULATORY STATUS COMMUNICATION OF NEEDS Skin   Extensive Assist Verbally Other (Comment), Surgical wounds, Wound Vac (Blister, cellulitis, weeping. Incision on left hip (prevena wound vac). Non-pressure wound on lower transverse cesarean section right anterior blister (wound vac). Non-pressure  wound on right labia. MASD on bilateral proximal thigh/groin.)                       Personal Care Assistance Level of Assistance  Bathing, Feeding, Dressing Bathing Assistance: Maximum assistance Feeding assistance: Limited assistance Dressing Assistance: Maximum assistance     Functional Limitations Info  Sight, Hearing, Speech Sight Info: Adequate Hearing Info: Adequate Speech Info: Adequate    SPECIAL CARE FACTORS FREQUENCY  PT (By licensed PT), OT (By licensed OT)     PT Frequency: 5 x week OT Frequency: 5 x week            Contractures Contractures Info: Not present    Additional Factors Info  Code Status, Allergies Code Status Info: Full code Allergies Info: NKDA           Current Medications (01/19/2021):  This is the current hospital active medication list Current Facility-Administered Medications  Medication Dose Route Frequency Provider Last Rate Last Admin   bisacodyl (DULCOLAX) suppository 10 mg  10 mg Rectal Daily PRN 03/21/2021, MD       docusate sodium (COLACE) capsule 100 mg  100 mg Oral BID Signa Kell, MD   100 mg at 01/19/21 0820   enoxaparin (LOVENOX) injection 40 mg  40 mg Subcutaneous Q24H 03/21/21, MD   40 mg at 01/19/21 0819   feeding supplement (ENSURE ENLIVE / ENSURE PLUS) liquid 237 mL  237 mL Oral BID BM 03/21/21, MD   237 mL at 01/19/21 1428   HYDROmorphone (DILAUDID) injection 0.2-0.4 mg  0.2-0.4  mg Intravenous Q4H PRN Signa Kell, MD       methocarbamol (ROBAXIN) tablet 500 mg  500 mg Oral Q6H PRN Signa Kell, MD   500 mg at 01/19/21 1232   Or   methocarbamol (ROBAXIN) 500 mg in dextrose 5 % 50 mL IVPB  500 mg Intravenous Q6H PRN Signa Kell, MD       metoCLOPramide (REGLAN) tablet 5-10 mg  5-10 mg Oral Q8H PRN Signa Kell, MD       Or   metoCLOPramide (REGLAN) injection 5-10 mg  5-10 mg Intravenous Q8H PRN Signa Kell, MD       midodrine (PROAMATINE) tablet 2.5 mg  2.5 mg Oral TID WC Alberteen Sam, MD    2.5 mg at 01/19/21 1232   multivitamin with minerals tablet 1 tablet  1 tablet Oral Daily Signa Kell, MD   1 tablet at 01/19/21 0820   nitroGLYCERIN (NITROSTAT) SL tablet 0.4 mg  0.4 mg Sublingual Q5 min PRN Signa Kell, MD       ondansetron [facility]) tablet 4 mg  4 mg Oral Q6H PRN Signa Kell, MD   4 mg at 01/17/21 1718   Or   ondansetron (ZOFRAN) injection 4 mg  4 mg Intravenous Q6H PRN Signa Kell, MD   4 mg at 01/18/21 5277   oxyCODONE (Oxy IR/ROXICODONE) immediate release tablet 10-15 mg  10-15 mg Oral Q4H PRN Signa Kell, MD   10 mg at 01/19/21 1427   oxyCODONE (Oxy IR/ROXICODONE) immediate release tablet 5-10 mg  5-10 mg Oral Q4H PRN Signa Kell, MD   10 mg at 01/17/21 1730   promethazine (PHENERGAN) tablet 25 mg  25 mg Oral Q6H PRN Alberteen Sam, MD   25 mg at 01/18/21 0840   Or   promethazine (PHENERGAN) 6.25 mg in sodium chloride 0.9 % 50 mL IVPB  6.25 mg Intravenous Q6H PRN Danford, Earl Lites, MD       Or   promethazine (PHENERGAN) suppository 25 mg  25 mg Rectal Q6H PRN Danford, Earl Lites, MD       rifampin (RIFADIN) capsule 300 mg  300 mg Oral BID Lynn Ito, MD       rosuvastatin (CRESTOR) tablet 10 mg  10 mg Oral Daily Signa Kell, MD   10 mg at 01/19/21 0820   scopolamine (TRANSDERM-SCOP) 1 MG/3DAYS 1.5 mg  1 patch Transdermal Q72H Anson [place], PA-C       senna-docusate (Senokot-S) tablet 1 tablet  1 tablet Oral QHS PRN Signa Kell, MD       sodium phosphate (FLEET) 7-19 GM/118ML enema 1 enema  1 enema Rectal Once PRN Signa Kell, MD       torsemide [facility]) tablet 10 mg  10 mg Oral Daily Signa Kell, MD   10 mg at 01/19/21 0820   traMADol (ULTRAM) tablet 50 mg  50 mg Oral Q6H PRN Signa Kell, MD   50 mg at 01/19/21 1232   traZODone (DESYREL) tablet 25 mg  25 mg Oral QHS PRN Signa Kell, MD       vancomycin [facility]s) IVPB 1250 mg/250 mL  1,250 mg Intravenous Q24H Signa Kell, MD 166.7 mL/hr at 01/18/21 2300 1,250 mg at  01/18/21 2300     Discharge Medications: Please see discharge summary for a list of discharge medications.  Relevant Imaging Results:  Relevant Lab Results:   Additional Information SS#: [id number]. Not vaccinated.  Margarito Liner, LCSW

## 2021-01-19 NOTE — TOC Initial Note (Signed)
Transition of Care [facility]) - Initial/Assessment Note    Patient Details  Name: Monica [name] MRN: [id number] Date of Birth: [birth year]  Transition of Care [facility]) CM/SW Contact:    Candie Chroman, LCSW Phone Number: 01/19/2021, 3:34 PM  Clinical Narrative:  CSW met with patient. Granddaughter at bedside. CSW introduced role and explained that PT recommendations would be discussed. Patient initially resistant to SNF placement but after further discussion with granddaughter, she is agreeable. [facility] is first preference. Admissions coordinator is aware. She was there 2/28-3/27. Patient is not vaccinated. No further concerns. CSW encouraged patient to contact CSW as needed. CSW will continue to follow patient for support and facilitate discharge to SNF once medically stable.                Expected Discharge Plan: Skilled Nursing Facility Barriers to Discharge: Continued Medical Work up   Patient Goals and CMS Choice   CMS Medicare.gov Compare Post Acute Care list provided to:: Patient (Granddaughter at bedside)    Expected Discharge Plan and Services Expected Discharge Plan: [place] Acute Care Choice: [place] Living arrangements for the past 2 months: Single Family Home                                      Prior Living Arrangements/Services Living arrangements for the past 2 months: Single Family Home Lives with:: Adult Children Patient language and need for interpreter reviewed:: Yes Do you feel safe going back to the place where you live?: Yes      Need for Family Participation in Patient Care: Yes (Comment) Care giver support system in place?: Yes (comment)   Criminal Activity/Legal Involvement Pertinent to Current Situation/Hospitalization: No - Comment as needed  Activities of Daily Living Home Assistive Devices/Equipment: Gilford Rile (specify type) ADL Screening (condition at time of admission) Patient's cognitive ability  adequate to safely complete daily activities?: Yes Is the patient deaf or have difficulty hearing?: No Does the patient have difficulty seeing, even when wearing glasses/contacts?: No Does the patient have difficulty concentrating, remembering, or making decisions?: No Patient able to express need for assistance with ADLs?: Yes Does the patient have difficulty dressing or bathing?: No Independently performs ADLs?: Yes (appropriate for developmental age) Does the patient have difficulty walking or climbing stairs?: Yes Weakness of Legs: Both Weakness of Arms/Hands: None  Permission Sought/Granted Permission sought to share information with : Facility Sport and exercise psychologist, Family Supports Permission granted to share information with : Yes, Verbal Permission Granted     Permission granted to share info w AGENCY: SNF's  Permission granted to share info w Relationship: Granddaughter at bedside     Emotional Assessment Appearance:: Appears stated age Attitude/Demeanor/Rapport: Engaged, Gracious Affect (typically observed): Accepting, Appropriate, Calm, Pleasant Orientation: : Oriented to Self, Oriented to Place, Oriented to  Time, Oriented to Situation Alcohol / Substance Use: Not Applicable Psych Involvement: No (comment)  Admission diagnosis:  Abscess of left hip [L02.416] Cellulitis of left lower extremity [L03.116] Patient Active Problem List   Diagnosis Date Noted   Abdominal aortic aneurysm (AAA) without rupture (HCC)    Obesity, Class III, BMI 40-49.9 (morbid obesity) (HCC)    Abscess of left hip 01/15/2021   Acute respiratory failure with hypoxia ([place]) 10/05/2020   Severe aortic stenosis    Closed comminuted intertrochanteric fracture of proximal end of left femur ([place]) 10/02/2020   Aortic  stenosis    Obesity    Cellulitis of left lower extremity 04/29/2018   GI bleed 06/05/2017   Nausea vomiting and diarrhea    Acute ST elevation myocardial infarction (STEMI) of  inferior wall ([place]) 06/02/2017   CAD (coronary artery disease) 2018   PCP:  Patient, No Pcp Per (Inactive) Pharmacy:   [facility] [address] ([address]), [place] [address] [place]) [place] [address] Phone: [phone] Fax: [phone]     Social Determinants of Health (SDOH) Interventions    Readmission Risk Interventions No flowsheet data found.

## 2021-01-20 ENCOUNTER — Inpatient Hospital Stay: Payer: Medicare Other

## 2021-01-20 DIAGNOSIS — Z01818 Encounter for other preprocedural examination: Secondary | ICD-10-CM

## 2021-01-20 DIAGNOSIS — L02416 Cutaneous abscess of left lower limb: Secondary | ICD-10-CM | POA: Diagnosis not present

## 2021-01-20 DIAGNOSIS — I714 Abdominal aortic aneurysm, without rupture: Secondary | ICD-10-CM | POA: Diagnosis not present

## 2021-01-20 DIAGNOSIS — I5022 Chronic systolic (congestive) heart failure: Secondary | ICD-10-CM | POA: Diagnosis not present

## 2021-01-20 LAB — CBC
HCT: 45.1 % (ref 36.0–46.0)
Hemoglobin: 13.4 g/dL (ref 12.0–15.0)
MCH: 23.7 pg — ABNORMAL LOW (ref 26.0–34.0)
MCHC: 29.7 g/dL — ABNORMAL LOW (ref 30.0–36.0)
MCV: 79.7 fL — ABNORMAL LOW (ref 80.0–100.0)
Platelets: 226 10*3/uL (ref 150–400)
RBC: 5.66 MIL/uL — ABNORMAL HIGH (ref 3.87–5.11)
RDW: 18.4 % — ABNORMAL HIGH (ref 11.5–15.5)
WBC: 9.5 10*3/uL (ref 4.0–10.5)
nRBC: 0 % (ref 0.0–0.2)

## 2021-01-20 LAB — CULTURE, BLOOD (ROUTINE X 2): Culture: NO GROWTH

## 2021-01-20 LAB — BASIC METABOLIC PANEL
Anion gap: 9 (ref 5–15)
BUN: 23 mg/dL (ref 8–23)
CO2: 28 mmol/L (ref 22–32)
Calcium: 9.1 mg/dL (ref 8.9–10.3)
Chloride: 97 mmol/L — ABNORMAL LOW (ref 98–111)
Creatinine, Ser: 0.82 mg/dL (ref 0.44–1.00)
GFR, Estimated: >60 mL/min (ref >60)
Glucose, Bld: 104 mg/dL — ABNORMAL HIGH (ref 70–99)
Potassium: 4.4 mmol/L (ref 3.5–5.1)
Sodium: 134 mmol/L — ABNORMAL LOW (ref 135–145)

## 2021-01-20 LAB — TROPONIN I (HIGH SENSITIVITY)
Troponin I (High Sensitivity): 11 ng/L (ref <18)
Troponin I (High Sensitivity): 12 ng/L (ref <18)

## 2021-01-20 LAB — D-DIMER, QUANTITATIVE: D-Dimer, Quant: 3.53 ug/mL-FEU — ABNORMAL HIGH (ref 0.00–0.50)

## 2021-01-20 LAB — VANCOMYCIN, PEAK: Vancomycin Pk: 19 ug/mL — ABNORMAL LOW (ref 30–40)

## 2021-01-20 LAB — VANCOMYCIN, TROUGH: Vancomycin Tr: 16 ug/mL (ref 15–20)

## 2021-01-20 MED ORDER — IOHEXOL 350 MG/ML SOLN
75.0000 mL | Freq: Once | INTRAVENOUS | Status: AC | PRN
  Administered 2021-01-20: 75 mL via INTRAVENOUS

## 2021-01-20 MED ORDER — GUAIFENESIN-DM 100-10 MG/5ML PO SYRP
5.0000 mL | ORAL_SOLUTION | ORAL | Status: DC | PRN
  Administered 2021-01-20: 5 mL via ORAL
  Filled 2021-01-20: qty 5

## 2021-01-20 NOTE — Plan of Care (Signed)
  Problem: Education: Goal: Knowledge of General Education information will improve Description: Including pain rating scale, medication(s)/side effects and non-pharmacologic comfort measures Outcome: Progressing   Problem: Clinical Measurements: Goal: Ability to maintain clinical measurements within normal limits will improve Outcome: Progressing Goal: Will remain free from infection Outcome: Progressing Goal: Respiratory complications will improve Outcome: Progressing   Problem: Activity: Goal: Risk for activity intolerance will decrease Outcome: Progressing   

## 2021-01-20 NOTE — Progress Notes (Signed)
Progress Note  Patient Name: Monica [name] Date of Encounter: 01/20/2021  [facility] Cardiologist: Lorine Bears, MD   Subjective   Plan for 7 days IV abx prior to surgery on Wednesday. She denies chest pain. Appears euvolemic on exam.   Inpatient Medications    Scheduled Meds:  Chlorhexidine Gluconate Cloth  6 each Topical Daily   docusate sodium  100 mg Oral BID   enoxaparin (LOVENOX) injection  40 mg Subcutaneous Q24H   feeding supplement  237 mL Oral BID BM   midodrine  2.5 mg Oral TID WC   multivitamin with minerals  1 tablet Oral Daily   rifampin  300 mg Oral BID   rosuvastatin  10 mg Oral Daily   scopolamine  1 patch Transdermal Q72H   torsemide  10 mg Oral Daily   Continuous Infusions:  methocarbamol (ROBAXIN) IV     promethazine (PHENERGAN) injection (IM or IVPB)     vancomycin 1,250 mg (01/19/21 2241)   PRN Meds: bisacodyl, HYDROmorphone (DILAUDID) injection, methocarbamol **OR** methocarbamol (ROBAXIN) IV, metoCLOPramide **OR** metoCLOPramide (REGLAN) injection, nitroGLYCERIN, ondansetron **OR** ondansetron (ZOFRAN) IV, oxyCODONE, oxyCODONE, promethazine **OR** promethazine (PHENERGAN) injection (IM or IVPB) **OR** promethazine, senna-docusate, sodium phosphate, traMADol, traZODone   Vital Signs    Vitals:   01/19/21 0413 01/19/21 0735 01/19/21 2043 01/20/21 0324  BP: 110/64 113/67 115/69 113/68  Pulse: 75 83 89 94  Resp: 16 18 18 16   Temp: 98.1 F (36.7 C) 98.1 F (36.7 C) 97.8 F (36.6 C) 99.8 F (37.7 C)  TempSrc: Oral Oral Oral Oral  SpO2: 100% 92% 97% 93%  Weight:      Height:        Intake/Output Summary (Last 24 hours) at 01/20/2021 0722 Last data filed at 01/20/2021 0527 Gross per 24 hour  Intake 984.55 ml  Output 1150 ml  Net -165.45 ml   Last 3 Weights 01/15/2021 10/02/2020 05/16/2020  Weight (lbs) 235 lb 240 lb 256 lb  Weight (kg) 106.595 kg 108.863 kg 116.121 kg      Telemetry    N/A - Personally Reviewed  ECG    NSR, T  wave changes anterior leads, 94bpm- Personally Reviewed  Physical Exam   GEN: No acute distress.   Neck: No JVD Cardiac: RRR, + murmur, rubs, or gallops.  Respiratory: diffusely diminished GI: Soft, nontender, non-distended  MS: No edema; No deformity. Neuro:  Nonfocal  Psych: Normal affect   Labs    High Sensitivity Troponin:  No results for input(s): TROPONINIHS in the last 720 hours.    Chemistry Recent Labs  Lab 01/15/21 1643 01/16/21 0410 01/18/21 0417 01/19/21 0416 01/20/21 0132  NA 134*   < > 134* 134* 134*  K 4.1   < > 4.3 4.2 4.4  CL 100   < > 103 99 97*  CO2 27   < > 23 28 28   GLUCOSE 120*   < > 119* 84 104*  BUN 16   < > 21 19 23   CREATININE 0.80   < > 0.91 0.97 0.82  CALCIUM 8.5*   < > 8.6* 8.7* 9.1  PROT 7.8  --   --   --   --   ALBUMIN 3.3*  --   --   --   --   AST 17  --   --   --   --   ALT 8  --   --   --   --   ALKPHOS 66  --   --   --   --  BILITOT 1.3*  --   --   --   --   GFRNONAA >60   < > >60 57* >60  ANIONGAP 7   < > 8 7 9    < > = values in this interval not displayed.     Hematology Recent Labs  Lab 01/18/21 0417 01/19/21 0416 01/20/21 0132  WBC 9.3 7.9 9.5  RBC 4.76 4.54 5.66*  HGB 11.2* 10.8* 13.4  HCT 37.3 35.3* 45.1  MCV 78.4* 77.8* 79.7*  MCH 23.5* 23.8* 23.7*  MCHC 30.0 30.6 29.7*  RDW 17.9* 18.1* 18.4*  PLT 276 279 226    BNPNo results for input(s): BNP, PROBNP in the last 168 hours.   DDimer No results for input(s): DDIMER in the last 168 hours.   Radiology    No results found.  Cardiac Studies   Echo 10/03/2020 1. Left ventricular ejection fraction, by estimation, is 60 to 65%. The  left ventricle has normal function. Left ventricular endocardial border  not optimally defined to evaluate regional wall motion. There is mild left  ventricular hypertrophy. Left  ventricular diastolic parameters are consistent with Grade I diastolic  dysfunction (impaired relaxation). Elevated left atrial pressure.   2. Right  ventricular systolic function is normal. The right ventricular  size is mildly enlarged. Tricuspid regurgitation signal is inadequate for  assessing PA pressure.   3. Left atrial size was mildly dilated.   4. Right atrial size was mildly dilated.   5. The mitral valve is degenerative. Trivial mitral valve regurgitation.  Mild mitral stenosis. Moderate mitral annular calcification.   6. Tricuspid valve regurgitation at least moderate but suboptimally  visualized.   7. The aortic valve is tricuspid. There is moderate calcification of the  aortic valve. There is severe thickening of the aortic valve. Aortic valve  regurgitation is not visualized. Severe aortic valve stenosis. Aortic  valve area, by VTI measures 0.51  cm. Aortic valve mean gradient measures 60.0 mmHg. Aortic valve Vmax  measures 4.83 m/s.   8. The inferior vena cava is normal in size with greater than 50%  respiratory variability, suggesting right atrial pressure of 3 mmHg.   Comparison(s): Very severe aortic stenosis, worsened compared with prior  echocardiogram from 10/26/2019.     Cardiac cath 2018   Mid RCA lesion, 50 %stenosed. Dist RCA lesion, 30 %stenosed. LM lesion, 20 %stenosed. Ost 1st Diag to 1st Diag lesion, 20 %stenosed. Ost Cx to Prox Cx lesion, 50 %stenosed. A drug eluting stent was successfully placed. Prox RCA to Mid RCA lesion, 100 %stenosed. Post intervention, there is a 0% residual stenosis.   1. Severe one-vessel coronary artery disease with thrombotic occlusion of the proximal and mid right coronary artery. Moderate ostial left circumflex stenosis and mild LAD disease. 2. At least moderate aortic stenosis with a peak to peak gradient of 22 mmHg. Normal left ventricular end-diastolic pressure. 3. Successful angioplasty and drug-eluting stent placement to the proximal right coronary artery extending into the midsegment. 4. Severe hypotension and bradycardia after opening the right coronary artery   likely due to neurohormonal reflects and required treatment with atropine and norepinephrine drip which was discontinued at the end of the case.   Recommendations: Dual antiplatelet therapy for at least one year. Aggressive treatment of risk factors and smoking cessation. Obtain an echocardiogram to evaluate aortic stenosis and LV systolic function. Avoid catheterization via the right radial artery in the future due to right radial loop.  Patient Profile     87  y.o. female with hx of CAD with previous inferior STEMI in October 2018 complicated by hypotension and bradcyardia s/p PCI and DES placement to proximal RCA, severe AS, GIB 2018, previous tobacco use, HLD who is being seen for cardiovascular pre-op evaluation for abdominal aortic aneurysm.   Assessment & Plan    Pre-op cardiovascular evaluation for abdominal aortic aneurysm repair - Incidental finding by CTA showing abdominal aortic aneurysm greater than 7.5cm, vascular was consulted. Originally admitted for left hip pain, found to have hip abscess being treated with abx - Due to comorbidites and severe AS deemed not a candidate for open surgical repair. Also she is at a high risk for endovascular repair - After a long discussion patient whishes to undergo endovascular repair, however will need 7 days of IV abx prior to procedure  - per [facility] - we can follow-up perioperatively.    Severe AS - Echo 09/2020 showed LVEF 60-65%, mild LVH, G1DD, mildly dilated biatrium, trivial MR, severe AS with mean - will need structural heart team work-up for TAVR in the near future.   CAD s/p DES RCA in 2018 - No anginal symptoms - Continue statin   Hypotension - continue midodrine 2.5mg  TID - Bps good - avoid hypotension with severe AS   Chronic HFpEF - euvolemic on exam - continue home torsemide 10mg  daily   Hip abscess - s/p I&D - ID following, plan as above  For questions or updates, please contact [facility] Please consult  [email or web address] for contact info under        Signed, Kathrene Sinopoli , PA-C  01/20/2021, 7:22 AM

## 2021-01-20 NOTE — Progress Notes (Addendum)
[facility] PROGRESS NOTE    Folashade Gamboa  XBJ:[id number] DOB: [birth year] DOA: 01/15/2021 PCP: Patient, No Pcp Per (Inactive)      Brief Narrative:  Mrs. Spark is a 87 y.o. F with CAD s/p PCI x1 in 2018, severe AS being worked up for TAVR, Obesity, pHTN, and COPD who presented with hip pain.  Found to have infection at site of her feb hip repair.  Also noted incidentally to have large AAA.         Assessment & Plan:  Postsurgical hip infection S/p debridement of the left hip wound by Dr. Allena Katz, 6/7   See summary 6/10 - Continue vancomycin and rifampin day 6 of 28    Acute on chronic hypoxic respiratory failure and chest pain Has chronic hypoxic respiratory failure due to aortic stenosis, heart failure, and pulmonary hypertension  Today, devleoped hypoxia to 80%, dyspnea, chest discomfort.  EKG unremakrable.  CXR pesonally reviewed, no change, no edema. 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  We suspect this is a contaminant.  In carefully considering the relative risk of graft infection versus urgency of 8 cm aneurysm repair, I agree with vascular surgery to continue IV antibiotics for 7 days and proceed with repair next Wednesday  Obesity BMI 39  Vaginal candidiasis Given Fluconazole  1 dose on 6/10, and foley placed for 48 hours       Disposition: Status is: Inpatient  Remains inpatient appropriate because: She requires ongoing IV antibiotics, and upcoming endovascular AAA repair on Wednesday.   Dispo: The patient is from: Home              Anticipated d/c is to: SNF              Patient currently is not medically stable to d/c.   Difficult to place patient No       Level of care: Med-Surg       MDM: The below labs and imaging reports were reviewed and summarized above.  Medication management as above.    DVT prophylaxis: enoxaparin (LOVENOX) injection 40 mg Start: 01/17/21 0800 SCDs Start: 01/16/21 1611  Code Status: FULL Family Communication: Son at the bedside           Subjective: Having some chest pain increased dyspnea today.  Still nausea, still pain in her left hip.  Pain in her left knee.  No fever, confusion.  No sputum, vomiting.  Rash in her groin.     Objective: Vitals:   01/19/21 2043 01/20/21 0324 01/20/21 0806 01/20/21 1156  BP: 115/69 113/68 (!) 109/56 (!) 102/59  Pulse: 89 94 96 97  Resp: Temp: 97.8 F (36.6 C) 99.8 F (37.7 C) 98.9 F (37.2 C) 98.8 F (37.1 C)  TempSrc: Oral Oral Oral Oral  SpO2: 97% 93% 92% 96%  Weight:      Height:        Intake/Output Summary (  Last 24 hours) at 01/20/2021 1351 Last data filed at 01/20/2021 0803 Gross per 24 hour  Intake 1234.55 ml  Output 1150 ml  Net 84.55 ml   Filed Weights   01/15/21 1638  Weight: 106.6 kg    Examination: General appearance: Obese elderly female, lying in bed, appears debilitated and weak     HEENT: Anicteric, conjunctival pink, lids and lashes normal.  No nasal deformity, discharge, or epistaxis Skin: There are some blistering under the Tegaderm in her left hip wound VAC, she has a few small carbuncles around her waistband Cardiac: Mildly tachycardic, loud systolic murmur, no lower extremity edema Respiratory: Respiratory  rate increased, lungs diminished, I do not appreciate rales or wheezing although lung sounds are diminished due to body habitus Abdomen: Abdomen soft, no tenderness palpation or guarding, no ascites or distention no pulsatile mass MSK: Left knee is unremarkable, without effusion or redness.  Left hip redness is resolved. Neuro: Awake alert, extraocular movements intact, moves all extremities with severe generalized weakness, limited by pain left leg, symmetric strength, face symmetric, speech fluent Psych: Sensorium intact and responding to questions, attention normal, affect normal, judgment and insight appear normal       Data Reviewed: I have personally reviewed following labs and imaging studies:  CBC: Recent Labs  Lab 01/16/21 0410 01/17/21 0449 01/18/21 0417 01/19/21 0416 01/20/21 0132  WBC 8.8 8.6 9.3 7.9 9.5  HGB 11.2* 11.2* 11.2* 10.8* 13.4  HCT 36.4 36.7 37.3 35.3* 45.1  MCV 78.6* 78.3* 78.4* 77.8* 79.7*  PLT 269 285 276 279 226   Basic Metabolic Panel: Recent Labs  Lab 01/16/21 0410 01/17/21 0449 01/18/21 0417 01/19/21 0416 01/20/21 0132  NA 133* 133* 134* 134* 134*  K 4.3 4.7 4.3 4.2 4.4  CL 102 103 103 99 97*  CO2 GLUCOSE 109* 103* 119* 84 104*  BUN CREATININE 0.74 0.94 0.91 0.97 0.82  CALCIUM 8.2* 8.5* 8.6* 8.7* 9.1   GFR: Estimated Creatinine Clearance: 60.8 mL/min (by C-G formula based on SCr of 0.82 mg/dL). Liver Function Tests: Recent Labs  Lab 01/15/21 1643  AST 17  ALT 8  ALKPHOS 66  BILITOT 1.3*  PROT 7.8  ALBUMIN 3.3*   No results for input(s): LIPASE, AMYLASE in the last 168 hours. No results for input(s): AMMONIA in the last 168 hours. Coagulation Profile: No results for input(s): INR, PROTIME in the last 168 hours. Cardiac Enzymes: No results for input(s): CKTOTAL, CKMB, CKMBINDEX, TROPONINI in the last 168 hours. BNP (last 3 results) No results for input(s): PROBNP in the last 8760  hours. HbA1C: No results for input(s): HGBA1C in the last 72 hours. CBG: No results for input(s): GLUCAP in the last 168 hours. Lipid Profile: No results for input(s): CHOL, HDL, LDLCALC, TRIG, CHOLHDL, LDLDIRECT in the last 72 hours. Thyroid Function Tests: No results for input(s): TSH, T4TOTAL, FREET4, T3FREE, THYROIDAB in the last 72 hours. Anemia Panel: Recent Labs    01/19/21 0416  FERRITIN 27  TIBC 315  IRON 28   Urine analysis:    Component Value Date/Time   COLORURINE YELLOW (A) 04/29/2018 1357   APPEARANCEUR CLEAR (A) 04/29/2018 1357   LABSPEC 1.010 04/29/2018 1357   PHURINE 7.0 04/29/2018 1357   GLUCOSEU NEGATIVE 04/29/2018 1357   HGBUR SMALL (A) 04/29/2018 1357   BILIRUBINUR NEGATIVE 04/29/2018 1357   KETONESUR 5 (A) 04/29/2018 1357   PROTEINUR NEGATIVE 04/29/2018 1357   NITRITE NEGATIVE  04/29/2018 1357   LEUKOCYTESUR NEGATIVE 04/29/2018 1357   Sepsis Labs: @LABRCNTIP (procalcitonin:4,lacticacidven:4)  ) Recent Results (from the past 240 hour(s))  Blood culture (routine x 2)     Status: Abnormal   Collection Time: 01/15/21  4:43 PM   Specimen: BLOOD LEFT HAND  Result Value Ref Range Status   Specimen Description   Final    BLOOD LEFT HAND Performed at [facility], [address], [place], [place]    Special Requests   Final    BOTTLES DRAWN AEROBIC AND ANAEROBIC Blood Culture adequate volume Performed at [facility], [address] [place], [place]    Culture  Setup Time   Final    Organism ID to follow GRAM POSITIVE COCCI AEROBIC BOTTLE ONLY CRITICAL RESULT CALLED TO, READ BACK BY AND VERIFIED WITH: [place] [id number] ON 01/16/2021 [facility]. Performed at [facility], [address] [place], [place]    Culture (A)  Final    STAPHYLOCOCCUS EPIDERMIDIS THE SIGNIFICANCE OF ISOLATING THIS ORGANISM FROM A SINGLE SET OF BLOOD CULTURES WHEN MULTIPLE SETS ARE DRAWN IS UNCERTAIN. PLEASE NOTIFY THE  MICROBIOLOGY DEPARTMENT WITHIN ONE WEEK IF SPECIATION AND SENSITIVITIES ARE REQUIRED. Performed at [facility], [address], [place], [place]    Report Status 01/18/2021 FINAL  Final  Blood culture (routine x 2)     Status: None   Collection Time: 01/15/21  4:43 PM   Specimen: BLOOD  Result Value Ref Range Status   Specimen Description BLOOD BLOOD RIGHT WRIST  Final   Special Requests   Final    BOTTLES DRAWN AEROBIC AND ANAEROBIC Blood Culture results may not be optimal due to an inadequate volume of blood received in culture bottles   Culture   Final    NO GROWTH 5 DAYS Performed at [facility], [address], [place], [place]    Report Status 01/20/2021 FINAL  Final  Blood Culture ID Panel (Reflexed)     Status: Abnormal   Collection Time: 01/15/21  4:43 PM  Result Value Ref Range Status   Enterococcus faecalis NOT DETECTED NOT DETECTED Final   Enterococcus Faecium NOT DETECTED NOT DETECTED Final   Listeria monocytogenes NOT DETECTED NOT DETECTED Final   Staphylococcus species DETECTED (A) NOT DETECTED Final    Comment: CRITICAL RESULT CALLED TO, READ BACK BY AND VERIFIED WITH: AMY THOMPSON AT 03/17/21 ON 01/16/2021 [facility].    Staphylococcus aureus (BCID) NOT DETECTED NOT DETECTED Final   Staphylococcus epidermidis DETECTED (A) NOT DETECTED Final    Comment: Methicillin (oxacillin) resistant coagulase negative staphylococcus. Possible blood culture contaminant (unless isolated from more than one blood culture draw or clinical case suggests pathogenicity). No antibiotic treatment is indicated for blood  culture contaminants. CRITICAL RESULT CALLED TO, READ BACK BY AND VERIFIED WITH: AMY THOMPSON AT 03/18/2021 ON 01/16/2021 [facility].    Staphylococcus lugdunensis NOT DETECTED NOT DETECTED Final   Streptococcus species NOT DETECTED NOT DETECTED Final   Streptococcus agalactiae NOT DETECTED NOT DETECTED Final   Streptococcus pneumoniae NOT DETECTED NOT DETECTED  Final   Streptococcus pyogenes NOT DETECTED NOT DETECTED Final   A.calcoaceticus-baumannii NOT DETECTED NOT DETECTED Final   Bacteroides fragilis NOT DETECTED NOT DETECTED Final   Enterobacterales NOT DETECTED NOT DETECTED Final   Enterobacter cloacae complex NOT DETECTED NOT DETECTED Final   Escherichia coli NOT DETECTED NOT DETECTED Final   Klebsiella aerogenes NOT DETECTED NOT DETECTED Final   Klebsiella  oxytoca NOT DETECTED NOT DETECTED Final   Klebsiella pneumoniae NOT DETECTED NOT DETECTED Final   Proteus species NOT DETECTED NOT DETECTED Final   Salmonella species NOT DETECTED NOT DETECTED Final   Serratia marcescens NOT DETECTED NOT DETECTED Final   Haemophilus influenzae NOT DETECTED NOT DETECTED Final   Neisseria meningitidis NOT DETECTED NOT DETECTED Final   Pseudomonas aeruginosa NOT DETECTED NOT DETECTED Final   Stenotrophomonas maltophilia NOT DETECTED NOT DETECTED Final   Candida albicans NOT DETECTED NOT DETECTED Final   Candida auris NOT DETECTED NOT DETECTED Final   Candida glabrata NOT DETECTED NOT DETECTED Final   Candida krusei NOT DETECTED NOT DETECTED Final   Candida parapsilosis NOT DETECTED NOT DETECTED Final   Candida tropicalis NOT DETECTED NOT DETECTED Final   Cryptococcus neoformans/gattii NOT DETECTED NOT DETECTED Final   Methicillin resistance mecA/C DETECTED (A) NOT DETECTED Final    Comment: CRITICAL RESULT CALLED TO, READ BACK BY AND VERIFIED WITH: AMY THOMPSON AT 6063 ON 01/16/2021 [facility]. Performed at [facility], [address] [place], [place] [address]   Resp Panel by RT-PCR (Flu A&B, Covid) Nasopharyngeal Swab     Status: None   Collection Time: 01/15/21  8:24 PM   Specimen: Nasopharyngeal Swab; Nasopharyngeal(NP) swabs in vial transport medium  Result Value Ref Range Status   SARS Coronavirus 2 by RT PCR NEGATIVE NEGATIVE Final    Comment: (NOTE) SARS-CoV-2 target nucleic acids are NOT DETECTED.  The SARS-CoV-2 RNA is generally  detectable in upper respiratory specimens during the acute phase of infection. The lowest concentration of SARS-CoV-2 viral copies this assay can detect is 138 copies/mL. A negative result does not preclude SARS-Cov-2 infection and should not be used as the sole basis for treatment or other patient management decisions. A negative result may occur with  improper specimen collection/handling, submission of specimen other than nasopharyngeal swab, presence of viral mutation(s) within the areas targeted by this assay, and inadequate number of viral copies(<138 copies/mL). A negative result must be combined with clinical observations, patient history, and epidemiological information. The expected result is Negative.  Fact Sheet for Patients:  BloggerCourse.com  Fact Sheet for Healthcare Providers:  SeriousBroker.it  This test is no t yet approved or cleared by the [place] FDA and  has been authorized for detection and/or diagnosis of SARS-CoV-2 by FDA under an Emergency Use Authorization (EUA). This EUA will remain  in effect (meaning this test can be used) for the duration of the COVID-19 declaration under Section 564(b)(1) of the Act, 21 U.S.C.section 360bbb-3(b)(1), unless the authorization is terminated  or revoked sooner.       Influenza A by PCR NEGATIVE NEGATIVE Final   Influenza B by PCR NEGATIVE NEGATIVE Final    Comment: (NOTE) The Xpert Xpress SARS-CoV-2/FLU/RSV plus assay is intended as an aid in the diagnosis of influenza from Nasopharyngeal swab specimens and should not be used as a sole basis for treatment. Nasal washings and aspirates are unacceptable for Xpert Xpress SARS-CoV-2/FLU/RSV testing.  Fact Sheet for Patients: BloggerCourse.com  Fact Sheet for Healthcare Providers: SeriousBroker.it  This test is not yet approved or cleared by the [place] FDA  and has been authorized for detection and/or diagnosis of SARS-CoV-2 by FDA under an Emergency Use Authorization (EUA). This EUA will remain in effect (meaning this test can be used) for the duration of the COVID-19 declaration under Section 564(b)(1) of the Act, 21 U.S.C. section 360bbb-3(b)(1), unless the authorization is terminated or revoked.  Performed at  [facility], [address], [place], [place] [address]   Aerobic/Anaerobic Culture w Gram Stain (surgical/deep wound)     Status: None (Preliminary result)   Collection Time: 01/16/21  2:25 PM   Specimen: Wound; Abscess  Result Value Ref Range Status   Specimen Description   Final    WOUND Performed at [facility], [address], [place], [place] [address]    Special Requests   Final    LEFT HIP Performed at [facility], [address] [place], [place] [address]    Gram Stain   Final    RARE WBC PRESENT,BOTH PMN AND MONONUCLEAR NO ORGANISMS SEEN Performed at [facility], [address], [place], [place] [address]    Culture   Final    RARE METHICILLIN RESISTANT STAPHYLOCOCCUS AUREUS NO ANAEROBES ISOLATED; CULTURE IN PROGRESS FOR 5 DAYS    Report Status PENDING  Incomplete   Organism ID, Bacteria METHICILLIN RESISTANT STAPHYLOCOCCUS AUREUS  Final      Susceptibility   Methicillin resistant staphylococcus aureus - MIC*    CIPROFLOXACIN 4 RESISTANT Resistant     ERYTHROMYCIN >=8 RESISTANT Resistant     GENTAMICIN <=0.5 SENSITIVE Sensitive     OXACILLIN >=4 RESISTANT Resistant     TETRACYCLINE <=1 SENSITIVE Sensitive     VANCOMYCIN <=0.5 SENSITIVE Sensitive     TRIMETH/SULFA <=10 SENSITIVE Sensitive     CLINDAMYCIN <=0.25 SENSITIVE Sensitive     RIFAMPIN <=0.5 SENSITIVE Sensitive     Inducible Clindamycin NEGATIVE Sensitive     * RARE METHICILLIN RESISTANT STAPHYLOCOCCUS AUREUS  Aerobic/Anaerobic Culture w Gram Stain (surgical/deep wound)     Status: None (Preliminary result)    Collection Time: 01/16/21  2:25 PM   Specimen: Wound; Abscess  Result Value Ref Range Status   Specimen Description   Final    WOUND Performed at [facility], [address] [place], [place] [address]    Special Requests LEFT HIP  Final   Gram Stain   Final    FEW WBC PRESENT, PREDOMINANTLY PMN NO ORGANISMS SEEN Performed at [facility], [address] [place], [place] [address]    Culture   Final    RARE METHICILLIN RESISTANT STAPHYLOCOCCUS AUREUS NO ANAEROBES ISOLATED; CULTURE IN PROGRESS FOR 5 DAYS    Report Status PENDING  Incomplete   Organism ID, Bacteria METHICILLIN RESISTANT STAPHYLOCOCCUS AUREUS  Final      Susceptibility   Methicillin resistant staphylococcus aureus - MIC*    CIPROFLOXACIN 4 RESISTANT Resistant     ERYTHROMYCIN >=8 RESISTANT Resistant     GENTAMICIN <=0.5 SENSITIVE Sensitive     OXACILLIN >=4 RESISTANT Resistant     TETRACYCLINE <=1 SENSITIVE Sensitive     VANCOMYCIN <=0.5 SENSITIVE Sensitive     TRIMETH/SULFA <=10 SENSITIVE Sensitive     CLINDAMYCIN <=0.25 SENSITIVE Sensitive     RIFAMPIN <=0.5 SENSITIVE Sensitive     Inducible Clindamycin NEGATIVE Sensitive     * RARE METHICILLIN RESISTANT STAPHYLOCOCCUS AUREUS  Aerobic/Anaerobic Culture w Gram Stain (surgical/deep wound)     Status: None (Preliminary result)   Collection Time: 01/16/21  2:26 PM   Specimen: Wound; Abscess  Result Value Ref Range Status   Specimen Description   Final    WOUND Performed at [facility], [address] [place], [place] [address]    Special Requests   Final    LEFT HIP Performed at [facility], [address], [place], [place]  [id number]    Gram Stain   Final    NO WBC SEEN NO ORGANISMS SEEN Performed at [facility], [address] [place], [place] [address]    Culture   Final    RARE METHICILLIN RESISTANT STAPHYLOCOCCUS AUREUS NO ANAEROBES ISOLATED; CULTURE IN PROGRESS FOR 5 DAYS    Report Status PENDING   Incomplete   Organism ID, Bacteria METHICILLIN RESISTANT STAPHYLOCOCCUS AUREUS  Final      Susceptibility   Methicillin resistant staphylococcus aureus - MIC*    CIPROFLOXACIN 4 RESISTANT Resistant     ERYTHROMYCIN >=8 RESISTANT Resistant     GENTAMICIN <=0.5 SENSITIVE Sensitive     OXACILLIN >=4 RESISTANT Resistant     TETRACYCLINE <=1 SENSITIVE Sensitive     VANCOMYCIN 1 SENSITIVE Sensitive     TRIMETH/SULFA <=10 SENSITIVE Sensitive     CLINDAMYCIN <=0.25 SENSITIVE Sensitive     RIFAMPIN <=0.5 SENSITIVE Sensitive     Inducible Clindamycin NEGATIVE Sensitive     * RARE METHICILLIN RESISTANT STAPHYLOCOCCUS AUREUS  CULTURE, BLOOD (ROUTINE X 2) w Reflex to ID Panel     Status: None (Preliminary result)   Collection Time: 01/17/21 12:10 PM   Specimen: BLOOD  Result Value Ref Range Status   Specimen Description BLOOD BLOOD RIGHT HAND  Final   Special Requests   Final    BOTTLES DRAWN AEROBIC AND ANAEROBIC Blood Culture results may not be optimal due to an excessive volume of blood received in culture bottles   Culture   Final    NO GROWTH 3 DAYS Performed at [facility], [address] [place], [place] [id number]    Report Status PENDING  Incomplete  CULTURE, BLOOD (ROUTINE X 2) w Reflex to ID Panel     Status: None (Preliminary result)   Collection Time: 01/17/21 12:13 PM   Specimen: BLOOD LEFT HAND  Result Value Ref Range Status   Specimen Description BLOOD LEFT HAND  Final   Special Requests   Final    BOTTLES DRAWN AEROBIC AND ANAEROBIC Blood Culture adequate volume   Culture   Final    NO GROWTH 3 DAYS Performed at [facility], [address] [place], [place] [id number]    Report Status PENDING  Incomplete         Radiology Studies: DG Chest Port 1 View  Result Date: 01/20/2021 CLINICAL DATA:  87 year old female with a history of severe aortic stenosis EXAM: PORTABLE CHEST 1 VIEW COMPARISON:  10/05/2020, chest CT 01/17/2021 FINDINGS:  Cardiomediastinal silhouette unchanged in size and contour. No pneumothorax.  No large pleural effusion. Low lung volumes persist, with persistent reticular opacities throughout the lungs, predominantly at the periphery and with no gradient from superior to inferior. No new confluent airspace disease. Chronic deformities of the right-sided ribs. IMPRESSION: Similar appearance of low lung volumes and chronic scarring/fibrosis, without definite evidence of superimposed acute cardiopulmonary disease Electronically Signed   By: Gilmer MorJaime  Wagner D.O.   On: 01/20/2021 13:12         Scheduled Meds:  Chlorhexidine Gluconate Cloth  6 each Topical Daily   docusate sodium  100 mg Oral BID   enoxaparin (LOVENOX) injection  40 mg Subcutaneous Q24H   feeding supplement  237 mL Oral BID BM   midodrine  2.5 mg Oral TID WC   multivitamin with minerals  1 tablet Oral Daily   rifampin  300 mg Oral BID   rosuvastatin  10 mg Oral Daily   scopolamine  1 patch Transdermal Q72H   torsemide  10 mg Oral Daily   Continuous Infusions:  methocarbamol (ROBAXIN) IV     promethazine (PHENERGAN) injection (IM or IVPB)     vancomycin Stopped (01/20/21 0011)     LOS: 5 days    Time spent: 35 minutes    Alberteen Sam, MD [facility] 01/20/2021, 1:51 PM     Please page though AMION or Epic secure chat:  For [employer], Higher education careers adviser

## 2021-01-20 NOTE — Progress Notes (Addendum)
4 Days Post-Op   Subjective/Chief Complaint: Patient doing well overall.  VAC in place.  She denies any fevers or chills.   Objective: Vital signs in last 24 hours: Temp:  [97.8 F (36.6 C)-99.8 F (37.7 C)] 98.9 F (37.2 C) (06/11 0806) Pulse Rate:  [89-96] 96 (06/11 0806) Resp:  [16-18] 18 (06/11 0806) BP: (109-115)/(56-69) 109/56 (06/11 0806) SpO2:  [92 %-97 %] 92 % (06/11 0806) Last BM Date: 01/19/21  Intake/Output from previous day: 06/10 0701 - 06/11 0700 In: 984.6 [P.O.:480; IV Piggyback:504.6] Out: 1150 [Urine:1150] Intake/Output this shift: Total I/O In: 250 [IV Piggyback:250] Out: -   General appearance: alert, cooperative, appears stated age, no distress, and morbidly obese Head: Normocephalic, without obvious abnormality, atraumatic Eyes: conjunctivae/corneas clear. PERRL, EOM's intact. Fundi benign. Neck: no adenopathy, no carotid bruit, no JVD, supple, symmetrical, trachea midline, and thyroid not enlarged, symmetric, no tenderness/mass/nodules Resp: clear to auscultation bilaterally and normal percussion bilaterally Chest wall: no tenderness GI: normal findings: soft, non-tender Pulses:  L brachial 2+ R brachial 2+  L radial 2+ R radial 2+  L inguinal 2+ R inguinal 2+  L popliteal 2+ R popliteal 2+  L posterior tibial 2+ R posterior tibial 2+  L dorsalis pedis 2+ R dorsalis pedis 2+   Right Pulses: FEM: present 1+, POP: present 1+, DP: present 1+, PT: present 1+ Left Pulses: FEM: present 1+, POP: present 1+, DP : present 1+, PT: present 1+ Skin: Skin color, texture, turgor normal. No rashes or lesions or  VAC in place at left  hip, minimal drainage. Seal good. Incision/Wound:  Lab Results:  Recent Labs    01/19/21 0416 01/20/21 0132  WBC 7.9 9.5  HGB 10.8* 13.4  HCT 35.3* 45.1  PLT 279 226   BMET Recent Labs    01/19/21 0416 01/20/21 0132  NA 134* 134*  K 4.2 4.4  CL 99 97*  CO2 28 28  GLUCOSE 84 104*  BUN 19 23  CREATININE 0.97 0.82   CALCIUM 8.7* 9.1   PT/INR No results for input(s): LABPROT, INR in the last 72 hours. ABG No results for input(s): PHART, HCO3 in the last 72 hours.  Invalid input(s): PCO2, PO2  Studies/Results: No results found.  Anti-infectives: Anti-infectives (From admission, onward)    Start     Dose/Rate Route Frequency Ordered Stop   01/19/21 2200  rifampin (RIFADIN) capsule 300 mg        300 mg Oral 2 times daily 01/19/21 1356     01/19/21 1830  fluconazole (DIFLUCAN) tablet 150 mg        150 mg Oral  Once 01/19/21 1743 01/19/21 1840   01/17/21 1200  ceFEPIme (MAXIPIME) 2 g in sodium chloride 0.9 % 100 mL IVPB  Status:  Discontinued        2 g 200 mL/hr over 30 Minutes Intravenous Every 12 hours 01/17/21 0824 01/18/21 1158   01/16/21 1900  vancomycin (VANCOREADY) IVPB 1250 mg/250 mL        1,250 mg 166.7 mL/hr over 90 Minutes Intravenous Every 24 hours 01/15/21 2318     01/16/21 0100  ceFEPIme (MAXIPIME) 2 g in sodium chloride 0.9 % 100 mL IVPB  Status:  Discontinued        2 g 200 mL/hr over 30 Minutes Intravenous Every 8 hours 01/15/21 2310 01/17/21 0824   01/16/21 0000  vancomycin (VANCOREADY) IVPB 1000 mg/200 mL  Status:  Discontinued        1,000 mg 200 mL/hr  over 60 Minutes Intravenous  Once 01/15/21 2310 01/15/21 2313   01/16/21 0000  vancomycin (VANCOREADY) IVPB 1500 mg/300 mL        1,500 mg 150 mL/hr over 120 Minutes Intravenous  Once 01/15/21 2313 01/16/21 0247   01/15/21 1845  vancomycin (VANCOCIN) IVPB 1000 mg/200 mL premix        1,000 mg 200 mL/hr over 60 Minutes Intravenous  Once 01/15/21 1841 01/15/21 2023   01/15/21 1845  piperacillin-tazobactam (ZOSYN) IVPB 3.375 g        3.375 g 100 mL/hr over 30 Minutes Intravenous  Once 01/15/21 1841 01/15/21 1922       Assessment/Plan: s/p Procedure(s): IRRIGATION AND DEBRIDEMENT HIP (Left) Planning for Complex AAA repair.  Cardiac evaluation done. Continue with ATB prior to surgery as planned.   No abdominal pain at  this time.  LOS: 5 days    Louisa Second 01/20/2021

## 2021-01-20 NOTE — Progress Notes (Signed)
OT Cancellation Note  Patient Details Name: Monica [name] MRN: [id number] DOB: [birth year]   Cancelled Treatment:    Reason Eval/Treat Not Completed: Patient declined, no reason specified Pt declines citing nausea this date and limited sleep last night. OT attempts to encourage pt participation in some level of ADL, exercise, or mobility; but pt continues to politely, yet adamantly decline therapy at this time. Will f/u at later date/time as able/pt agreeable.  Rejeana Brock, MS, OTR/L ascom [phone] 01/20/21, 3:04 PM

## 2021-01-20 NOTE — Progress Notes (Signed)
Pharmacy Antibiotic Note  Monica [name] is a 87 y.o. female admitted on 01/15/2021 with cellulitis.  Pharmacy has been consulted for Vancomycin, Cefepime  Dosing.  1 Bloodcx: Staph epi- sens pending (BCID= MRSE?) Wound cx x 3 = MRSA ID following  Vanco levels 6/10 vanco 1250mg  IV given at 22:41 6/11 vanco peak = 19 mcg/ml at 01:32 6/11 vanco trough = 16 mcg/ml at 21:13  Calculated AUC of 419 is at lower end of goal.  Renal function is stable, SCr 0.82  Plan: Will continue with current dosing of Vancomycin 1250mg  IV q24h. Increasing dose to 2250mg  IV q36h or 1500mg  IV q24h would achieve AUC closer to 500 but the trough would increase to 18-19 mcg/ml. Goal AUC 400-600 Monitor renal function Plans pending for AAA repair (endovascular repair)   Height: 5\' 5"  (165.1 cm) Weight: 106.6 kg (235 lb) IBW/kg (Calculated) : 57  Temp (24hrs), Avg:99.1 F (37.3 C), Min:98.8 F (37.1 C), Max:99.8 F (37.7 C)  Recent Labs  Lab 01/16/21 0410 01/17/21 0449 01/18/21 0417 01/19/21 0416 01/20/21 0132 01/20/21 2113  WBC 8.8 8.6 9.3 7.9 9.5  --   CREATININE 0.74 0.94 0.91 0.97 0.82  --   VANCOTROUGH  --   --   --   --   --  16  VANCOPEAK  --   --   --   --  19*  --      Estimated Creatinine Clearance: 60.8 mL/min (by C-G formula based on SCr of 0.82 mg/dL).    No Known Allergies  Antimicrobials this admission: zosyn x1 dose 6/6 cefepime 6/7>>6/9 Vanc 6/6 (evening)>>  Dose adjustments this admission:   Microbiology results:  6/6 BCx: 2of4 (1 set)= GPC=Staph epi, sens pending 6/7 Wound x 3: MRSA 6/8 Bcx: NG  Thank you for allowing pharmacy to be a part of this patient's care.  03/22/21, PharmD, BCPS 01/20/2021 10:00 PM

## 2021-01-21 ENCOUNTER — Inpatient Hospital Stay: Payer: Medicare Other

## 2021-01-21 ENCOUNTER — Encounter: Payer: Self-pay | Admitting: Family Medicine

## 2021-01-21 DIAGNOSIS — L02416 Cutaneous abscess of left lower limb: Secondary | ICD-10-CM | POA: Diagnosis not present

## 2021-01-21 DIAGNOSIS — R0602 Shortness of breath: Secondary | ICD-10-CM

## 2021-01-21 DIAGNOSIS — I5022 Chronic systolic (congestive) heart failure: Secondary | ICD-10-CM | POA: Diagnosis not present

## 2021-01-21 DIAGNOSIS — I714 Abdominal aortic aneurysm, without rupture: Secondary | ICD-10-CM | POA: Diagnosis not present

## 2021-01-21 LAB — AEROBIC/ANAEROBIC CULTURE W GRAM STAIN (SURGICAL/DEEP WOUND): Gram Stain: NONE SEEN

## 2021-01-21 LAB — BASIC METABOLIC PANEL
Anion gap: 8 (ref 5–15)
BUN: 19 mg/dL (ref 8–23)
CO2: 32 mmol/L (ref 22–32)
Calcium: 8.7 mg/dL — ABNORMAL LOW (ref 8.9–10.3)
Chloride: 94 mmol/L — ABNORMAL LOW (ref 98–111)
Creatinine, Ser: 0.73 mg/dL (ref 0.44–1.00)
GFR, Estimated: >60 mL/min (ref >60)
Glucose, Bld: 95 mg/dL (ref 70–99)
Potassium: 3.3 mmol/L — ABNORMAL LOW (ref 3.5–5.1)
Sodium: 134 mmol/L — ABNORMAL LOW (ref 135–145)

## 2021-01-21 LAB — CBC
HCT: 37.4 % (ref 36.0–46.0)
Hemoglobin: 11.7 g/dL — ABNORMAL LOW (ref 12.0–15.0)
MCH: 23.7 pg — ABNORMAL LOW (ref 26.0–34.0)
MCHC: 31.3 g/dL (ref 30.0–36.0)
MCV: 75.7 fL — ABNORMAL LOW (ref 80.0–100.0)
Platelets: 298 10*3/uL (ref 150–400)
RBC: 4.94 MIL/uL (ref 3.87–5.11)
RDW: 18.4 % — ABNORMAL HIGH (ref 11.5–15.5)
WBC: 5.8 10*3/uL (ref 4.0–10.5)
nRBC: 0 % (ref 0.0–0.2)

## 2021-01-21 LAB — BRAIN NATRIURETIC PEPTIDE: B Natriuretic Peptide: 223.1 pg/mL — ABNORMAL HIGH (ref 0.0–100.0)

## 2021-01-21 MED ORDER — FUROSEMIDE 10 MG/ML IJ SOLN
40.0000 mg | Freq: Once | INTRAMUSCULAR | Status: AC
  Administered 2021-01-21: 40 mg via INTRAVENOUS
  Filled 2021-01-21: qty 4

## 2021-01-21 MED ORDER — TORSEMIDE 20 MG PO TABS
10.0000 mg | ORAL_TABLET | Freq: Every day | ORAL | Status: DC
  Administered 2021-01-21 – 2021-01-22 (×2): 10 mg via ORAL
  Filled 2021-01-21 (×2): qty 1

## 2021-01-21 MED ORDER — IPRATROPIUM-ALBUTEROL 0.5-2.5 (3) MG/3ML IN SOLN
RESPIRATORY_TRACT | Status: AC
  Administered 2021-01-21: 3 mL
  Filled 2021-01-21: qty 3

## 2021-01-21 MED ORDER — POTASSIUM CHLORIDE CRYS ER 20 MEQ PO TBCR
40.0000 meq | EXTENDED_RELEASE_TABLET | Freq: Two times a day (BID) | ORAL | Status: AC
  Administered 2021-01-21 (×2): 40 meq via ORAL
  Filled 2021-01-21 (×2): qty 2

## 2021-01-21 MED ORDER — IPRATROPIUM-ALBUTEROL 0.5-2.5 (3) MG/3ML IN SOLN
3.0000 mL | RESPIRATORY_TRACT | Status: DC | PRN
  Administered 2021-01-21: 3 mL via RESPIRATORY_TRACT

## 2021-01-21 MED ORDER — IOHEXOL 350 MG/ML SOLN
100.0000 mL | Freq: Once | INTRAVENOUS | Status: AC | PRN
  Administered 2021-01-21: 100 mL via INTRAVENOUS

## 2021-01-21 MED ORDER — IPRATROPIUM-ALBUTEROL 0.5-2.5 (3) MG/3ML IN SOLN
3.0000 mL | Freq: Four times a day (QID) | RESPIRATORY_TRACT | Status: DC
  Administered 2021-01-21 – 2021-01-31 (×35): 3 mL via RESPIRATORY_TRACT
  Filled 2021-01-21 (×37): qty 3

## 2021-01-21 MED ORDER — IPRATROPIUM-ALBUTEROL 0.5-2.5 (3) MG/3ML IN SOLN: 3.0000 mL | Freq: Three times a day (TID) | RESPIRATORY_TRACT | Status: DC | PRN

## 2021-01-21 NOTE — Progress Notes (Signed)
Pt c/o of feeling ill and having difficult breathing. Vital signs done sats in 70's and sustaining placed on non rebreather bag at 15L, R/T and rapid response called. On review by R/T, decreased to 4l and now maintaining sats at 89-92. Dr. Cristal Deer informed about pt's increase oxygen need. Pt also c/o pain to abdomen and left hip medicated with analgesic as ordered. [facility] nurse in attendance. Observation continues.

## 2021-01-21 NOTE — Progress Notes (Addendum)
Progress Note  Patient Name: Monica [name] Date of Encounter: 01/21/2021  [facility] Cardiologist: Lorine Bears, MD   Subjective   Rapid response called for hypoxia with O2 down into the 70s, placed on high flow O2. Also complained of lower abdominal pain given pain medication.  On my exam O2 is stable on high flow oxygen. She denies chest pain. She complains of abdominal pain.  BP 124/68 and pulse 93bpm..  CTA chest yesterday showed no PE, pulmonary arterial hypertension, possible fibrosis.   Inpatient Medications    Scheduled Meds:  Chlorhexidine Gluconate Cloth  6 each Topical Daily   docusate sodium  100 mg Oral BID   enoxaparin (LOVENOX) injection  40 mg Subcutaneous Q24H   feeding supplement  237 mL Oral BID BM   midodrine  2.5 mg Oral TID WC   multivitamin with minerals  1 tablet Oral Daily   rifampin  300 mg Oral BID   rosuvastatin  10 mg Oral Daily   Continuous Infusions:  methocarbamol (ROBAXIN) IV     promethazine (PHENERGAN) injection (IM or IVPB)     vancomycin 1,250 mg (01/20/21 2211)   PRN Meds: bisacodyl, guaiFENesin-dextromethorphan, HYDROmorphone (DILAUDID) injection, methocarbamol **OR** methocarbamol (ROBAXIN) IV, metoCLOPramide **OR** metoCLOPramide (REGLAN) injection, nitroGLYCERIN, ondansetron **OR** ondansetron (ZOFRAN) IV, oxyCODONE, oxyCODONE, promethazine **OR** promethazine (PHENERGAN) injection (IM or IVPB) **OR** promethazine, senna-docusate, sodium phosphate, traMADol, traZODone   Vital Signs    Vitals:   01/20/21 1156 01/20/21 1726 01/20/21 2336 01/21/21 0425  BP: (!) 102/59 (!) 99/53 (!) 97/50 (!) 111/52  Pulse: 97 94 88 89  Resp: 19 18 20 20   Temp: 98.8 F (37.1 C) 99 F (37.2 C) 99.4 F (37.4 C) 99 F (37.2 C)  TempSrc: Oral     SpO2: 96% 93% 90% 92%  Weight:      Height:        Intake/Output Summary (Last 24 hours) at 01/21/2021 03/23/2021 Last data filed at 01/20/2021 2100 Gross per 24 hour  Intake 250 ml  Output 3100 ml   Net -2850 ml   Last 3 Weights 01/15/2021 10/02/2020 05/16/2020  Weight (lbs) 235 lb 240 lb 256 lb  Weight (kg) 106.595 kg 108.863 kg 116.121 kg      Telemetry    N/A- Personally Reviewed  ECG    pending- Personally Reviewed  Physical Exam   GEN: No acute distress.   Neck: minimal JVD Cardiac: RRR, + murmur, no rubs, or gallops.  Respiratory: course sounds GI: Soft, nontender, non-distended  MS: No edema; No deformity. Neuro:  Nonfocal  Psych: Normal affect   Labs    High Sensitivity Troponin:   Recent Labs  Lab 01/20/21 1336 01/20/21 1515  TROPONINIHS 11 12      Chemistry Recent Labs  Lab 01/15/21 1643 01/16/21 0410 01/19/21 0416 01/20/21 0132 01/21/21 0451  NA 134*   < > 134* 134* 134*  K 4.1   < > 4.2 4.4 3.3*  CL 100   < > 99 97* 94*  CO2 27   < > 28 28 32  GLUCOSE 120*   < > 84 104* 95  BUN 16   < > 19 23 19   CREATININE 0.80   < > 0.97 0.82 0.73  CALCIUM 8.5*   < > 8.7* 9.1 8.7*  PROT 7.8  --   --   --   --   ALBUMIN 3.3*  --   --   --   --   AST 17  --   --   --   --  ALT 8  --   --   --   --   ALKPHOS 66  --   --   --   --   BILITOT 1.3*  --   --   --   --   GFRNONAA >60   < > 57* >60 >60  ANIONGAP 7   < > 7 9 8    < > = values in this interval not displayed.     Hematology Recent Labs  Lab 01/19/21 0416 01/20/21 0132 01/21/21 0451  WBC 7.9 9.5 5.8  RBC 4.54 5.66* 4.94  HGB 10.8* 13.4 11.7*  HCT 35.3* 45.1 37.4  MCV 77.8* 79.7* 75.7*  MCH 23.8* 23.7* 23.7*  MCHC 30.6 29.7* 31.3  RDW 18.1* 18.4* 18.4*  PLT 279 226 298    BNPNo results for input(s): BNP, PROBNP in the last 168 hours.   DDimer  Recent Labs  Lab 01/20/21 1336  DDIMER 3.53*     Radiology    CT Angio Chest Pulmonary Embolism (PE) W or WO Contrast  Result Date: 01/20/2021 CLINICAL DATA:  Elevated D-dimer, shortness of breath EXAM: CT ANGIOGRAPHY CHEST WITH CONTRAST TECHNIQUE: Multidetector CT imaging of the chest was performed using the standard protocol during  bolus administration of intravenous contrast. Multiplanar CT image reconstructions and MIPs were obtained to evaluate the vascular anatomy. CONTRAST:  4mL OMNIPAQUE IOHEXOL 350 MG/ML SOLN COMPARISON:  01/17/2021 FINDINGS: Cardiovascular: No evidence of pulmonary embolus. Heart is mildly enlarged. Diffuse coronary artery calcifications. Scattered moderate aortic calcifications. No aneurysm. Main pulmonary artery upper limits normal in diameter at 3.9 cm. Right and left pulmonary arteries are prominent. Findings stable. Mediastinum/Nodes: Stable mediastinal and bilateral hilar adenopathy. No axillary adenopathy. Lungs/Pleura: Peripheral ground-glass opacities and interstitial thickening likely reflects chronic lung disease. Findings are similar prior study. Dependent and bibasilar atelectasis or scarring. No effusions. No confluent opacities. Upper Abdomen: Gallstones noted within the gallbladder. The superior most aspect of the abdominal aortic aneurysm imaged. Calcifications in the spleen. No acute findings. Musculoskeletal: Chest wall soft tissues are unremarkable. No acute bony abnormality. Review of the MIP images confirms the above findings. IMPRESSION: No evidence of pulmonary embolus. Mild cardiomegaly.  Evidence of pulmonary arterial hypertension. Peripheral interstitial thickening and ground-glass opacities likely reflects fibrosis. Dependent and bibasilar atelectasis or scarring. Stable mediastinal and bilateral hilar adenopathy. Coronary artery disease. Cholelithiasis. Aortic Atherosclerosis (ICD10-I70.0). Electronically Signed   By: 03/19/2021 M.D.   On: 01/20/2021 19:04   DG Chest Port 1 View  Result Date: 01/20/2021 CLINICAL DATA:  87 year old female with a history of severe aortic stenosis EXAM: PORTABLE CHEST 1 VIEW COMPARISON:  10/05/2020, chest CT 01/17/2021 FINDINGS: Cardiomediastinal silhouette unchanged in size and contour. No pneumothorax.  No large pleural effusion. Low lung volumes  persist, with persistent reticular opacities throughout the lungs, predominantly at the periphery and with no gradient from superior to inferior. No new confluent airspace disease. Chronic deformities of the right-sided ribs. IMPRESSION: Similar appearance of low lung volumes and chronic scarring/fibrosis, without definite evidence of superimposed acute cardiopulmonary disease Electronically Signed   By: 03/19/2021 D.O.   On: 01/20/2021 13:12    Cardiac Studies     Echo 10/03/2020 1. Left ventricular ejection fraction, by estimation, is 60 to 65%. The  left ventricle has normal function. Left ventricular endocardial border  not optimally defined to evaluate regional wall motion. There is mild left  ventricular hypertrophy. Left  ventricular diastolic parameters are consistent with Grade I diastolic  dysfunction (impaired  relaxation). Elevated left atrial pressure.   2. Right ventricular systolic function is normal. The right ventricular  size is mildly enlarged. Tricuspid regurgitation signal is inadequate for  assessing PA pressure.   3. Left atrial size was mildly dilated.   4. Right atrial size was mildly dilated.   5. The mitral valve is degenerative. Trivial mitral valve regurgitation.  Mild mitral stenosis. Moderate mitral annular calcification.   6. Tricuspid valve regurgitation at least moderate but suboptimally  visualized.   7. The aortic valve is tricuspid. There is moderate calcification of the  aortic valve. There is severe thickening of the aortic valve. Aortic valve  regurgitation is not visualized. Severe aortic valve stenosis. Aortic  valve area, by VTI measures 0.51  cm. Aortic valve mean gradient measures 60.0 mmHg. Aortic valve Vmax  measures 4.83 m/s.   8. The inferior vena cava is normal in size with greater than 50%  respiratory variability, suggesting right atrial pressure of 3 mmHg.   Comparison(s): Very severe aortic stenosis, worsened compared with prior   echocardiogram from 10/26/2019.     Cardiac cath 2018   Mid RCA lesion, 50 %stenosed. Dist RCA lesion, 30 %stenosed. LM lesion, 20 %stenosed. Ost 1st Diag to 1st Diag lesion, 20 %stenosed. Ost Cx to Prox Cx lesion, 50 %stenosed. A drug eluting stent was successfully placed. Prox RCA to Mid RCA lesion, 100 %stenosed. Post intervention, there is a 0% residual stenosis.   1. Severe one-vessel coronary artery disease with thrombotic occlusion of the proximal and mid right coronary artery. Moderate ostial left circumflex stenosis and mild LAD disease. 2. At least moderate aortic stenosis with a peak to peak gradient of 22 mmHg. Normal left ventricular end-diastolic pressure. 3. Successful angioplasty and drug-eluting stent placement to the proximal right coronary artery extending into the midsegment. 4. Severe hypotension and bradycardia after opening the right coronary artery  likely due to neurohormonal reflects and required treatment with atropine and norepinephrine drip which was discontinued at the end of the case.   Recommendations: Dual antiplatelet therapy for at least one year. Aggressive treatment of risk factors and smoking cessation. Obtain an echocardiogram to evaluate aortic stenosis and LV systolic function. Avoid catheterization via the right radial artery in the future due to right radial loop.  Patient Profile     87 y.o. female with hx of CAD with previous inferior STEMI in October 2018 complicated by hypotension and bradcyardia s/p PCI and DES placement to proximal RCA, severe AS, GIB 2018, previous tobacco use, HLD who is being seen for cardiovascular pre-op evaluation for abdominal aortic aneurysm.   Assessment & Plan    Pre-op cardiovascular evaluation for abdominal aortic aneurysm repair - Incidental finding by CTA showing abdominal aortic aneurysm greater than 7.5cm, vascular was consulted. Originally admitted for left hip pain, found to have hip abscess being  treated with abx - Due to comorbidites and severe AS deemed not a candidate for open surgical repair. Also she is at a high risk for endovascular repair - After a long discussion patient whishes to undergo endovascular repair, however will need 7 days of IV abx prior to procedure, plan for surgery next Wednesday. - per [facility] - given abdominal pain this AM can consider repeat imaging although BP and Hgb stable this AM - we can follow-up perioperatively.  SOB/Hypoxia - I will check CXR and EKG - consider ABG - continue O2 - Home Torsemide held, consider restarting   Severe AS - Echo 09/2020 showed LVEF 60-65%,  mild LVH, G1DD, mildly dilated biatrium, trivial MR, severe AS with mean - will need structural heart team work-up for TAVR in the near future.   CAD s/p DES RCA in 2018 - No anginal symptoms - Continue statin   Hypotension - continue midodrine 2.5mg  TID - Bps wnl - avoid hypotension with severe AS   Chronic HFpEF - appears euvolemic on exam - PTA torsemide held as above - check BNP   Hip abscess - s/p I&D - ID following, plan as above  For questions or updates, please contact [facility] Please consult [email or web address] for contact info under        Signed, [place] David Stall, PA-C  01/21/2021, 7:12 AM

## 2021-01-21 NOTE — Significant Event (Signed)
Rapid Response Event Note   Reason for Call : Called RRT for decreased 02 sats   Initial Focused Assessment: pt lying in bed, 02 sats 88-94%. 02 increased to 4L [place] then to 10L bubble HF. Pt re educated on how to use IS, cough and deep breathe. C/O stomach and hip pain, medicated by night shift RN. VSS- see flowsheets.      Interventions: Spoke with Dr Maryfrances Bunnell, who also spoke with respiratory... ordered breathing tx, scheduled and prn. Cardio in communication with Hospitalist   Plan of Care: Bet (pts nurse) and Rowe Clack to call for further assistance.    Event Summary: as above  MD Notified: Danford [id number] Call Time: 0709 Arrival Time:0710 End [id number]  Itxel Wickard A, RN

## 2021-01-21 NOTE — Progress Notes (Signed)
5 Days Post-Op   Subjective/Chief Complaint: Patient complaining of new onset of abdominal pain, denies any nausea or vomiting.  She states that the pain started this morning and it has not eased off.   Objective: Vital signs in last 24 hours: Temp:  [99 F (37.2 C)-99.6 F (37.6 C)] 99.6 F (37.6 C) (06/12 0757) Pulse Rate:  [88-94] 92 (06/12 0757) Resp:  [18-20] 19 (06/12 0757) BP: (97-134)/(50-68) 134/65 (06/12 0757) SpO2:  [90 %-93 %] 91 % (06/12 0759) Last BM Date: 01/19/21  Intake/Output from previous day: 06/11 0701 - 06/12 0700 In: 250 [IV Piggyback:250] Out: 3950 [Urine:3950] Intake/Output this shift: No intake/output data recorded.  General appearance: alert, cooperative, moderate distress, and morbidly obese Head: Normocephalic, without obvious abnormality, atraumatic Eyes: conjunctivae/corneas clear. PERRL, EOM's intact. Fundi benign. Neck: no adenopathy, no carotid bruit, no JVD, supple, symmetrical, trachea midline, and thyroid not enlarged, symmetric, no tenderness/mass/nodules Back: symmetric, no curvature. ROM normal. No CVA tenderness. Resp: clear to auscultation bilaterally and normal percussion bilaterally Chest wall: no tenderness GI: abnormal findings:  obese and diffuse tenderness on palpation, no reboud. Skin: Skin color, texture, turgor normal. No rashes or lesions or normal and temperature normal Incision/Wound: VAC in place and intact.  Minimal drainage.  Lab Results:  Recent Labs    01/20/21 0132 01/21/21 0451  WBC 9.5 5.8  HGB 13.4 11.7*  HCT 45.1 37.4  PLT 226 298   BMET Recent Labs    01/20/21 0132 01/21/21 0451  NA 134* 134*  K 4.4 3.3*  CL 97* 94*  CO2 28 32  GLUCOSE 104* 95  BUN 23 19  CREATININE 0.82 0.73  CALCIUM 9.1 8.7*   PT/INR No results for input(s): LABPROT, INR in the last 72 hours. ABG No results for input(s): PHART, HCO3 in the last 72 hours.  Invalid input(s): PCO2, PO2  Studies/Results: DG Chest 2  View  Result Date: 01/21/2021 CLINICAL DATA:  Shortness of breath EXAM: CHEST - 2 VIEW COMPARISON:  January 20, 2021, FINDINGS: The cardiomediastinal silhouette is unchanged and enlarged in contour.Tortuous thoracic aorta. No pleural effusion. No pneumothorax. Revisualization of coarse bilateral reticular opacities most consistent with underlying pulmonary fibrosis. Visualized abdomen is unremarkable. Multilevel degenerative changes of the thoracic spine. IMPRESSION: Coarse bilateral reticular opacities consistent with underlying pulmonary fibrosis. Electronically Signed   By: Meda Klinefelter MD   On: 01/21/2021 10:33   CT Angio Chest Pulmonary Embolism (PE) W or WO Contrast  Result Date: 01/20/2021 CLINICAL DATA:  Elevated D-dimer, shortness of breath EXAM: CT ANGIOGRAPHY CHEST WITH CONTRAST TECHNIQUE: Multidetector CT imaging of the chest was performed using the standard protocol during bolus administration of intravenous contrast. Multiplanar CT image reconstructions and MIPs were obtained to evaluate the vascular anatomy. CONTRAST:  64mL OMNIPAQUE IOHEXOL 350 MG/ML SOLN COMPARISON:  01/17/2021 FINDINGS: Cardiovascular: No evidence of pulmonary embolus. Heart is mildly enlarged. Diffuse coronary artery calcifications. Scattered moderate aortic calcifications. No aneurysm. Main pulmonary artery upper limits normal in diameter at 3.9 cm. Right and left pulmonary arteries are prominent. Findings stable. Mediastinum/Nodes: Stable mediastinal and bilateral hilar adenopathy. No axillary adenopathy. Lungs/Pleura: Peripheral ground-glass opacities and interstitial thickening likely reflects chronic lung disease. Findings are similar prior study. Dependent and bibasilar atelectasis or scarring. No effusions. No confluent opacities. Upper Abdomen: Gallstones noted within the gallbladder. The superior most aspect of the abdominal aortic aneurysm imaged. Calcifications in the spleen. No acute findings. Musculoskeletal:  Chest wall soft tissues are unremarkable. No acute bony abnormality. Review of  the MIP images confirms the above findings. IMPRESSION: No evidence of pulmonary embolus. Mild cardiomegaly.  Evidence of pulmonary arterial hypertension. Peripheral interstitial thickening and ground-glass opacities likely reflects fibrosis. Dependent and bibasilar atelectasis or scarring. Stable mediastinal and bilateral hilar adenopathy. Coronary artery disease. Cholelithiasis. Aortic Atherosclerosis (ICD10-I70.0). Electronically Signed   By: Charlett Nose M.D.   On: 01/20/2021 19:04   DG Chest Port 1 View  Result Date: 01/20/2021 CLINICAL DATA:  87 year old female with a history of severe aortic stenosis EXAM: PORTABLE CHEST 1 VIEW COMPARISON:  10/05/2020, chest CT 01/17/2021 FINDINGS: Cardiomediastinal silhouette unchanged in size and contour. No pneumothorax.  No large pleural effusion. Low lung volumes persist, with persistent reticular opacities throughout the lungs, predominantly at the periphery and with no gradient from superior to inferior. No new confluent airspace disease. Chronic deformities of the right-sided ribs. IMPRESSION: Similar appearance of low lung volumes and chronic scarring/fibrosis, without definite evidence of superimposed acute cardiopulmonary disease Electronically Signed   By: Gilmer Mor D.O.   On: 01/20/2021 13:12    Anti-infectives: Anti-infectives (From admission, onward)    Start     Dose/Rate Route Frequency Ordered Stop   01/19/21 2200  rifampin (RIFADIN) capsule 300 mg        300 mg Oral 2 times daily 01/19/21 1356     01/19/21 1830  fluconazole (DIFLUCAN) tablet 150 mg        150 mg Oral  Once 01/19/21 1743 01/19/21 1840   01/17/21 1200  ceFEPIme (MAXIPIME) 2 g in sodium chloride 0.9 % 100 mL IVPB  Status:  Discontinued        2 g 200 mL/hr over 30 Minutes Intravenous Every 12 hours 01/17/21 0824 01/18/21 1158   01/16/21 1900  vancomycin (VANCOREADY) IVPB 1250 mg/250 mL         1,250 mg 166.7 mL/hr over 90 Minutes Intravenous Every 24 hours 01/15/21 2318     01/16/21 0100  ceFEPIme (MAXIPIME) 2 g in sodium chloride 0.9 % 100 mL IVPB  Status:  Discontinued        2 g 200 mL/hr over 30 Minutes Intravenous Every 8 hours 01/15/21 2310 01/17/21 0824   01/16/21 0000  vancomycin (VANCOREADY) IVPB 1000 mg/200 mL  Status:  Discontinued        1,000 mg 200 mL/hr over 60 Minutes Intravenous  Once 01/15/21 2310 01/15/21 2313   01/16/21 0000  vancomycin (VANCOREADY) IVPB 1500 mg/300 mL        1,500 mg 150 mL/hr over 120 Minutes Intravenous  Once 01/15/21 2313 01/16/21 0247   01/15/21 1845  vancomycin (VANCOCIN) IVPB 1000 mg/200 mL premix        1,000 mg 200 mL/hr over 60 Minutes Intravenous  Once 01/15/21 1841 01/15/21 2023   01/15/21 1845  piperacillin-tazobactam (ZOSYN) IVPB 3.375 g        3.375 g 100 mL/hr over 30 Minutes Intravenous  Once 01/15/21 1841 01/15/21 1922       Assessment/Plan: s/p Procedure(s): IRRIGATION AND DEBRIDEMENT HIP (Left) I willl order new CTA to assess AAA due to new onset of abdominal pain. I will watch her closely.  She also has drop in hemoglobin, which could be hemodilutional.   LOS: 6 days    Louisa Second 01/21/2021

## 2021-01-21 NOTE — Progress Notes (Signed)
Concern about increase in oxygen demand pt increased to 5l humidified oxygen after desat in  the 70-80 on 4l placed. Oxygen level increased on same to 92%. Observation continues

## 2021-01-21 NOTE — TOC Progression Note (Signed)
Transition of Care [facility]) - Progression Note    Patient Details  Name: Monica [name] MRN: [id number] Date of Birth: 1934-03-01  Transition of Care [facility]) CM/SW Contact  Liliana Cline, LCSW Phone Number: 01/21/2021, 2:06 PM  Clinical Narrative:   Patient prefers [facility]. CSW asked Inetta Fermo at [facility] to review referral and see if they can take patient back when medically ready.    Expected Discharge Plan: Skilled Nursing Facility Barriers to Discharge: Continued Medical Work up  Expected Discharge Plan and Services Expected Discharge Plan: Skilled Nursing Facility     Post Acute Care Choice: Skilled Nursing Facility Living arrangements for the past 2 months: Single Family Home                                       Social Determinants of Health (SDOH) Interventions    Readmission Risk Interventions No flowsheet data found.

## 2021-01-21 NOTE — Progress Notes (Addendum)
[facility] PROGRESS NOTE    Monica [name]  ZOX:[id number] DOB: June 19, 1934 DOA: 01/15/2021 PCP: Patient, No Pcp Per (Inactive)      Brief Narrative:  Monica [name] is a 87 y.o. F with CAD s/p PCI x1 in 2018, severe AS being worked up for TAVR, Obesity, pHTN, and COPD who presented with hip pain.  Found to have infection at site of her feb hip repair.  Also noted incidentally to have large AAA.         Assessment & Plan:  Postsurgical hip infection S/p debridement of the left hip wound by Dr. Allena Katz, 6/7   See summary 6/10 This appears to be resolving -Continue vancomycin and rifampin, day 7 of 28    Acute on chronic hypoxic respiratory failure and chest pain Has chronic hypoxic respiratory failure due to aortic stenosis, heart failure, and pulmonary hypertension  Last 3 days has repeatedly developed hypoxia.  EKG unremarkable, chest x-ray unremarkable, CT angiogram of the chest shows some patchy subpleural fibrosis, no PE, moderate atelectasis, no edema or large consolidation, no effusion, no other structural problem of lungs.    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  Severe aortic stenosis Chronic diastolic CHF Pulmonary hypertension Hypotension due to aortic stenosis Blood pressure normal -Continue torsemide - Continue midodrine  Coronary artery disease, secondary prevention - Continue Crestor  Iron deficiency anemia Hgb stable Given Feraheme 6/10 -Transfusion threshold 8 g/dL    Inactive or resolved issues: COPD Asymptomatic, no flare  Hyponatremia Mild, asymptomatic  Positive blood culture 1/2 admission cultures positive for staph epidermidis.  We suspect this is a contaminant.  In  carefully considering the relative risk of graft infection versus urgency of 8 cm aneurysm repair, I agree with vascular surgery to continue IV antibiotics for 7 days and proceed with repair next Wednesday  Obesity BMI 39  Vaginal candidiasis Given Fluconazole 1 dose on 6/10, and foley placed for 48 hours  Left renal nodule - Follow-up MRI as an outpatient     Disposition: Status is: Inpatient  Remains inpatient appropriate because: She requires ongoing IV antibiotics, and upcoming endovascular AAA repair on Wednesday.   Dispo: The patient is from: Home              Anticipated d/c is to: SNF              Patient currently is not medically stable to d/c.   Difficult to place patient No       Level of care: Med-Surg       MDM: The below labs and imaging reports were reviewed and summarized above.  Medication management as above.    DVT prophylaxis: enoxaparin (LOVENOX) injection 40 mg Start: 01/17/21 0800 SCDs Start: 01/16/21 1611  Code Status: FULL Family Communication: Son by phone           Subjective: Chest pain resolved.  Feeling weak and tired on my examination, later complained of vascular surgery of abdominal pain.  Still with a lot of nausea.  Rash in her groin unchanged.        Objective: Vitals:   01/21/21 1215 01/21/21 1230 01/21/21 1349 01/21/21 1629  BP: (!) 143/70   (!) 105/58  Pulse: 94   91  Resp: 17  18  Temp: 98.7 F (37.1 C)   98.6 F (37 C)  TempSrc: Oral     SpO2: (!) 89% 97% 95% 92%  Weight:      Height:        Intake/Output Summary (Last 24 hours) at 01/21/2021 1735 Last data filed at 01/21/2021 1649 Gross per 24 hour  Intake --  Output 4550 ml  Net -4550 ml   Filed Weights   01/15/21 1638  Weight: 106.6 kg    Examination: General appearance: Obese elderly female, lying in bed, appears debilitated and weak     HEENT: Anicteric, conjunctival pink, lids and lashes normal.  No nasal deformity, discharge, or  epistaxis Skin: There are some blistering under the Tegaderm in her left hip wound VAC, she has a few small carbuncles around her waistband Cardiac: Mildly tachycardic, loud systolic murmur, no lower extremity edema Respiratory: Respiratory rate increased, lungs diminished, I do not appreciate rales or wheezing although lung sounds are diminished due to body habitus Abdomen: Abdomen soft, no tenderness palpation or guarding, no ascites or distention no pulsatile mass MSK: Left knee is unremarkable, without effusion or redness.  Left hip redness is resolved. Neuro: Awake alert, extraocular movements intact, moves all extremities with severe generalized weakness, limited by pain left leg, symmetric strength, face symmetric, speech fluent Psych: Sensorium intact and responding to questions, attention normal, affect normal, judgment and insight appear normal       Data Reviewed: I have personally reviewed following labs and imaging studies:  CBC: Recent Labs  Lab 01/17/21 0449 01/18/21 0417 01/19/21 0416 01/20/21 0132 01/21/21 0451  WBC 8.6 9.3 7.9 9.5 5.8  HGB 11.2* 11.2* 10.8* 13.4 11.7*  HCT 36.7 37.3 35.3* 45.1 37.4  MCV 78.3* 78.4* 77.8* 79.7* 75.7*  PLT 285 276 279 226 298   Basic Metabolic Panel: Recent Labs  Lab 01/17/21 0449 01/18/21 0417 01/19/21 0416 01/20/21 0132 01/21/21 0451  NA 133* 134* 134* 134* 134*  K 4.7 4.3 4.2 4.4 3.3*  CL 103 103 99 97* 94*  CO2 32  GLUCOSE 103* 119* 84 104* 95  BUN CREATININE 0.94 0.91 0.97 0.82 0.73  CALCIUM 8.5* 8.6* 8.7* 9.1 8.7*   GFR: Estimated Creatinine Clearance: 62.3 mL/min (by C-G formula based on SCr of 0.73 mg/dL). Liver Function Tests: Recent Labs  Lab 01/15/21 1643  AST 17  ALT 8  ALKPHOS 66  BILITOT 1.3*  PROT 7.8  ALBUMIN 3.3*   No results for input(s): LIPASE, AMYLASE in the last 168 hours. No results for input(s): AMMONIA in the last 168 hours. Coagulation Profile: No  results for input(s): INR, PROTIME in the last 168 hours. Cardiac Enzymes: No results for input(s): CKTOTAL, CKMB, CKMBINDEX, TROPONINI in the last 168 hours. BNP (last 3 results) No results for input(s): PROBNP in the last 8760 hours. HbA1C: No results for input(s): HGBA1C in the last 72 hours. CBG: No results for input(s): GLUCAP in the last 168 hours. Lipid Profile: No results for input(s): CHOL, HDL, LDLCALC, TRIG, CHOLHDL, LDLDIRECT in the last 72 hours. Thyroid Function Tests: No results for input(s): TSH, T4TOTAL, FREET4, T3FREE, THYROIDAB in the last 72 hours. Anemia Panel: Recent Labs    01/19/21 0416  FERRITIN 27  TIBC 315  IRON 28   Urine analysis:    Component Value Date/Time   COLORURINE YELLOW (A) 04/29/2018 1357   APPEARANCEUR CLEAR (A) 04/29/2018 1357   LABSPEC 1.010 04/29/2018 1357  PHURINE 7.0 04/29/2018 1357   GLUCOSEU NEGATIVE 04/29/2018 1357   HGBUR SMALL (A) 04/29/2018 1357   BILIRUBINUR NEGATIVE 04/29/2018 1357   KETONESUR 5 (A) 04/29/2018 1357   PROTEINUR NEGATIVE 04/29/2018 1357   NITRITE NEGATIVE 04/29/2018 1357   LEUKOCYTESUR NEGATIVE 04/29/2018 1357   Sepsis Labs: @LABRCNTIP (procalcitonin:4,lacticacidven:4)  ) Recent Results (from the past 240 hour(s))  Blood culture (routine x 2)     Status: Abnormal   Collection Time: 01/15/21  4:43 PM   Specimen: BLOOD LEFT HAND  Result Value Ref Range Status   Specimen Description   Final    BLOOD LEFT HAND Performed at [facility], [address] [place], [place]    Special Requests   Final    BOTTLES DRAWN AEROBIC AND ANAEROBIC Blood Culture adequate volume Performed at [facility], [address] [place], [place]    Culture  Setup Time   Final    Organism ID to follow GRAM POSITIVE COCCI AEROBIC BOTTLE ONLY CRITICAL RESULT CALLED TO, READ BACK BY AND VERIFIED [place]: [place] [id number] ON 01/16/2021 [facility]. Performed at [facility], [address] [place], [place]    Culture (A)  Final    STAPHYLOCOCCUS EPIDERMIDIS THE SIGNIFICANCE OF ISOLATING THIS ORGANISM FROM A SINGLE SET OF BLOOD CULTURES WHEN MULTIPLE SETS ARE DRAWN IS UNCERTAIN. PLEASE NOTIFY THE MICROBIOLOGY DEPARTMENT WITHIN ONE WEEK IF SPECIATION AND SENSITIVITIES ARE REQUIRED. Performed at [facility], [address], [place], [place]    Report Status 01/18/2021 FINAL  Final  Blood culture (routine x 2)     Status: None   Collection Time: 01/15/21  4:43 PM   Specimen: BLOOD  Result Value Ref Range Status   Specimen Description BLOOD BLOOD RIGHT WRIST  Final   Special Requests   Final    BOTTLES DRAWN AEROBIC AND ANAEROBIC Blood Culture results may not be optimal due to an inadequate volume of blood received in culture bottles   Culture   Final    NO GROWTH 5 DAYS Performed at [facility], [address], [place], [place]    Report Status 01/20/2021 FINAL  Final  Blood Culture ID Panel (Reflexed)     Status: Abnormal   Collection Time: 01/15/21  4:43 PM  Result Value Ref Range Status   Enterococcus faecalis NOT DETECTED NOT DETECTED Final   Enterococcus Faecium NOT DETECTED NOT DETECTED Final   Listeria monocytogenes NOT DETECTED NOT DETECTED Final   Staphylococcus species DETECTED (A) NOT DETECTED Final    Comment: CRITICAL RESULT CALLED TO, READ BACK BY AND VERIFIED WITH: AMY THOMPSON AT 03/17/21 ON 01/16/2021 [facility].    Staphylococcus aureus (BCID) NOT DETECTED NOT DETECTED Final   Staphylococcus epidermidis DETECTED (A) NOT DETECTED Final    Comment: Methicillin (oxacillin) resistant coagulase negative staphylococcus. Possible blood culture contaminant (unless isolated from more than one blood culture draw or clinical case suggests pathogenicity). No antibiotic treatment is indicated for blood  culture contaminants. CRITICAL RESULT CALLED TO, READ BACK BY AND VERIFIED WITH: AMY THOMPSON AT 03/18/2021 ON 01/16/2021  [facility].    Staphylococcus lugdunensis NOT DETECTED NOT DETECTED Final   Streptococcus species NOT DETECTED NOT DETECTED Final   Streptococcus agalactiae NOT DETECTED NOT DETECTED Final   Streptococcus pneumoniae NOT DETECTED NOT DETECTED Final   Streptococcus pyogenes NOT DETECTED NOT DETECTED Final   A.calcoaceticus-baumannii NOT DETECTED NOT DETECTED Final   Bacteroides fragilis NOT DETECTED NOT DETECTED  Final   Enterobacterales NOT DETECTED NOT DETECTED Final   Enterobacter cloacae complex NOT DETECTED NOT DETECTED Final   Escherichia coli NOT DETECTED NOT DETECTED Final   Klebsiella aerogenes NOT DETECTED NOT DETECTED Final   Klebsiella oxytoca NOT DETECTED NOT DETECTED Final   Klebsiella pneumoniae NOT DETECTED NOT DETECTED Final   Proteus species NOT DETECTED NOT DETECTED Final   Salmonella species NOT DETECTED NOT DETECTED Final   Serratia marcescens NOT DETECTED NOT DETECTED Final   Haemophilus influenzae NOT DETECTED NOT DETECTED Final   Neisseria meningitidis NOT DETECTED NOT DETECTED Final   Pseudomonas aeruginosa NOT DETECTED NOT DETECTED Final   Stenotrophomonas maltophilia NOT DETECTED NOT DETECTED Final   Candida albicans NOT DETECTED NOT DETECTED Final   Candida auris NOT DETECTED NOT DETECTED Final   Candida glabrata NOT DETECTED NOT DETECTED Final   Candida krusei NOT DETECTED NOT DETECTED Final   Candida parapsilosis NOT DETECTED NOT DETECTED Final   Candida tropicalis NOT DETECTED NOT DETECTED Final   Cryptococcus neoformans/gattii NOT DETECTED NOT DETECTED Final   Methicillin resistance mecA/C DETECTED (A) NOT DETECTED Final    Comment: CRITICAL RESULT CALLED TO, READ BACK BY AND VERIFIED WITH: AMY THOMPSON AT [id number] ON 01/16/2021 [facility]. Performed at [facility], [address] [place], [place] [address]   Resp Panel by RT-PCR (Flu A&B, Covid) Nasopharyngeal Swab     Status: None   Collection Time: 01/15/21  8:24 PM   Specimen: Nasopharyngeal Swab;  Nasopharyngeal(NP) swabs in vial transport medium  Result Value Ref Range Status   SARS Coronavirus 2 by RT PCR NEGATIVE NEGATIVE Final    Comment: (NOTE) SARS-CoV-2 target nucleic acids are NOT DETECTED.  The SARS-CoV-2 RNA is generally detectable in upper respiratory specimens during the acute phase of infection. The lowest concentration of SARS-CoV-2 viral copies this assay can detect is 138 copies/mL. A negative result does not preclude SARS-Cov-2 infection and should not be used as the sole basis for treatment or other patient management decisions. A negative result may occur with  improper specimen collection/handling, submission of specimen other than nasopharyngeal swab, presence of viral mutation(s) within the areas targeted by this assay, and inadequate number of viral copies(<138 copies/mL). A negative result must be combined with clinical observations, patient history, and epidemiological information. The expected result is Negative.  Fact Sheet for Patients:  BloggerCourse.comhttps://www.fda.gov/media/152166/download  Fact Sheet for Healthcare Providers:  SeriousBroker.ithttps://www.fda.gov/media/152162/download  This test is no t yet approved or cleared by the [place] FDA and  has been authorized for detection and/or diagnosis of SARS-CoV-2 by FDA under an Emergency Use Authorization (EUA). This EUA will remain  in effect (meaning this test can be used) for the duration of the COVID-19 declaration under Section 564(b)(1) of the Act, 21 U.S.C.section 360bbb-3(b)(1), unless the authorization is terminated  or revoked sooner.       Influenza A by PCR NEGATIVE NEGATIVE Final   Influenza B by PCR NEGATIVE NEGATIVE Final    Comment: (NOTE) The Xpert Xpress SARS-CoV-2/FLU/RSV plus assay is intended as an aid in the diagnosis of influenza from Nasopharyngeal swab specimens and should not be used as a sole basis for treatment. Nasal washings and aspirates are unacceptable for Xpert Xpress  SARS-CoV-2/FLU/RSV testing.  Fact Sheet for Patients: BloggerCourse.comhttps://www.fda.gov/media/152166/download  Fact Sheet for Healthcare Providers: SeriousBroker.ithttps://www.fda.gov/media/152162/download  This test is not yet approved or cleared by the [place] FDA and has been authorized for detection and/or diagnosis of SARS-CoV-2 by FDA under an Emergency Use Authorization (  EUA). This EUA will remain in effect (meaning this test can be used) for the duration of the COVID-19 declaration under Section 564(b)(1) of the Act, 21 U.S.C. section 360bbb-3(b)(1), unless the authorization is terminated or revoked.  Performed at [facility], [address] [place], [place] [address]   Aerobic/Anaerobic Culture w Gram Stain (surgical/deep wound)     Status: None   Collection Time: 01/16/21  2:25 PM   Specimen: Wound; Abscess  Result Value Ref Range Status   Specimen Description   Final    WOUND Performed at [facility], [address] [place], [place] [address]    Special Requests   Final    LEFT HIP Performed at [facility], [address] [place], [place] [address]    Gram Stain   Final    RARE WBC PRESENT,BOTH PMN AND MONONUCLEAR NO ORGANISMS SEEN    Culture   Final    RARE METHICILLIN RESISTANT STAPHYLOCOCCUS AUREUS NO ANAEROBES ISOLATED Performed at [facility], [address] [place], [place] [address]    Report Status 01/21/2021 FINAL  Final   Organism ID, Bacteria METHICILLIN RESISTANT STAPHYLOCOCCUS AUREUS  Final      Susceptibility   Methicillin resistant staphylococcus aureus - MIC*    CIPROFLOXACIN 4 RESISTANT Resistant     ERYTHROMYCIN >=8 RESISTANT Resistant     GENTAMICIN <=0.5 SENSITIVE Sensitive     OXACILLIN >=4 RESISTANT Resistant     TETRACYCLINE <=1 SENSITIVE Sensitive     VANCOMYCIN <=0.5 SENSITIVE Sensitive     TRIMETH/SULFA <=10 SENSITIVE Sensitive     CLINDAMYCIN <=0.25 SENSITIVE Sensitive     RIFAMPIN <=0.5 SENSITIVE Sensitive      Inducible Clindamycin NEGATIVE Sensitive     * RARE METHICILLIN RESISTANT STAPHYLOCOCCUS AUREUS  Aerobic/Anaerobic Culture w Gram Stain (surgical/deep wound)     Status: None   Collection Time: 01/16/21  2:25 PM   Specimen: Wound; Abscess  Result Value Ref Range Status   Specimen Description   Final    WOUND Performed at [facility], [address] [place], [place] [address]    Special Requests LEFT HIP  Final   Gram Stain   Final    FEW WBC PRESENT, PREDOMINANTLY PMN NO ORGANISMS SEEN    Culture   Final    RARE METHICILLIN RESISTANT STAPHYLOCOCCUS AUREUS NO ANAEROBES ISOLATED Performed at [facility], [address], [place], [place] [address]    Report Status 01/21/2021 FINAL  Final   Organism ID, Bacteria METHICILLIN RESISTANT STAPHYLOCOCCUS AUREUS  Final      Susceptibility   Methicillin resistant staphylococcus aureus - MIC*    CIPROFLOXACIN 4 RESISTANT Resistant     ERYTHROMYCIN >=8 RESISTANT Resistant     GENTAMICIN <=0.5 SENSITIVE Sensitive     OXACILLIN >=4 RESISTANT Resistant     TETRACYCLINE <=1 SENSITIVE Sensitive     VANCOMYCIN <=0.5 SENSITIVE Sensitive     TRIMETH/SULFA <=10 SENSITIVE Sensitive     CLINDAMYCIN <=0.25 SENSITIVE Sensitive     RIFAMPIN <=0.5 SENSITIVE Sensitive     Inducible Clindamycin NEGATIVE Sensitive     * RARE METHICILLIN RESISTANT STAPHYLOCOCCUS AUREUS  Aerobic/Anaerobic Culture w Gram Stain (surgical/deep wound)     Status: None   Collection Time: 01/16/21  2:26 PM   Specimen: Wound; Abscess  Result Value Ref Range Status   Specimen Description   Final    WOUND Performed at [facility], [address] [place] [place], [place] [address]  Special Requests   Final    LEFT HIP Performed at [facility], [address] [place], [place] [address]    Gram Stain NO WBC SEEN NO ORGANISMS SEEN   Final   Culture   Final    RARE METHICILLIN RESISTANT STAPHYLOCOCCUS AUREUS NO ANAEROBES  ISOLATED Performed at [facility], [address], [place], [place] [address]    Report Status 01/21/2021 FINAL  Final   Organism ID, Bacteria METHICILLIN RESISTANT STAPHYLOCOCCUS AUREUS  Final      Susceptibility   Methicillin resistant staphylococcus aureus - MIC*    CIPROFLOXACIN 4 RESISTANT Resistant     ERYTHROMYCIN >=8 RESISTANT Resistant     GENTAMICIN <=0.5 SENSITIVE Sensitive     OXACILLIN >=4 RESISTANT Resistant     TETRACYCLINE <=1 SENSITIVE Sensitive     VANCOMYCIN 1 SENSITIVE Sensitive     TRIMETH/SULFA <=10 SENSITIVE Sensitive     CLINDAMYCIN <=0.25 SENSITIVE Sensitive     RIFAMPIN <=0.5 SENSITIVE Sensitive     Inducible Clindamycin NEGATIVE Sensitive     * RARE METHICILLIN RESISTANT STAPHYLOCOCCUS AUREUS  CULTURE, BLOOD (ROUTINE X 2) w Reflex to ID Panel     Status: None (Preliminary result)   Collection Time: 01/17/21 12:10 PM   Specimen: BLOOD  Result Value Ref Range Status   Specimen Description BLOOD BLOOD RIGHT HAND  Final   Special Requests   Final    BOTTLES DRAWN AEROBIC AND ANAEROBIC Blood Culture results may not be optimal due to an excessive volume of blood received in culture bottles   Culture   Final    NO GROWTH 3 DAYS Performed at [facility], [address], [place], [place] [address]    Report Status PENDING  Incomplete  CULTURE, BLOOD (ROUTINE X 2) w Reflex to ID Panel     Status: None (Preliminary result)   Collection Time: 01/17/21 12:13 PM   Specimen: BLOOD LEFT HAND  Result Value Ref Range Status   Specimen Description BLOOD LEFT HAND  Final   Special Requests   Final    BOTTLES DRAWN AEROBIC AND ANAEROBIC Blood Culture adequate volume   Culture   Final    NO GROWTH 3 DAYS Performed at [facility], [address] [place], [place] [address]    Report Status PENDING  Incomplete         Radiology Studies: DG Chest 2 View  Result Date: 01/21/2021 CLINICAL DATA:  Shortness of breath EXAM: CHEST - 2 VIEW  COMPARISON:  January 20, 2021, FINDINGS: The cardiomediastinal silhouette is unchanged and enlarged in contour.Tortuous thoracic aorta. No pleural effusion. No pneumothorax. Revisualization of coarse bilateral reticular opacities most consistent with underlying pulmonary fibrosis. Visualized abdomen is unremarkable. Multilevel degenerative changes of the thoracic spine. IMPRESSION: Coarse bilateral reticular opacities consistent with underlying pulmonary fibrosis. Electronically Signed   By: Meda Klinefelter MD   On: 01/21/2021 10:33   CT Angio Chest Pulmonary Embolism (PE) W or WO Contrast  Result Date: 01/20/2021 CLINICAL DATA:  Elevated D-dimer, shortness of breath EXAM: CT ANGIOGRAPHY CHEST WITH CONTRAST TECHNIQUE: Multidetector CT imaging of the chest was performed using the standard protocol during bolus administration of intravenous contrast. Multiplanar CT image reconstructions and MIPs were obtained to evaluate the vascular anatomy. CONTRAST:  50mL OMNIPAQUE IOHEXOL 350 MG/ML SOLN COMPARISON:  01/17/2021 FINDINGS: Cardiovascular: No evidence of pulmonary embolus. Heart is mildly enlarged. Diffuse coronary artery calcifications. Scattered moderate aortic calcifications. No aneurysm. Main pulmonary artery upper limits normal  in diameter at 3.9 cm. Right and left pulmonary arteries are prominent. Findings stable. Mediastinum/Nodes: Stable mediastinal and bilateral hilar adenopathy. No axillary adenopathy. Lungs/Pleura: Peripheral ground-glass opacities and interstitial thickening likely reflects chronic lung disease. Findings are similar prior study. Dependent and bibasilar atelectasis or scarring. No effusions. No confluent opacities. Upper Abdomen: Gallstones noted within the gallbladder. The superior most aspect of the abdominal aortic aneurysm imaged. Calcifications in the spleen. No acute findings. Musculoskeletal: Chest wall soft tissues are unremarkable. No acute bony abnormality. Review of the MIP  images confirms the above findings. IMPRESSION: No evidence of pulmonary embolus. Mild cardiomegaly.  Evidence of pulmonary arterial hypertension. Peripheral interstitial thickening and ground-glass opacities likely reflects fibrosis. Dependent and bibasilar atelectasis or scarring. Stable mediastinal and bilateral hilar adenopathy. Coronary artery disease. Cholelithiasis. Aortic Atherosclerosis (ICD10-I70.0). Electronically Signed   By: Charlett Nose M.D.   On: 01/20/2021 19:04   CT ANGIO ABDOMEN W &/OR WO CONTRAST  Result Date: 01/21/2021 CLINICAL DATA:  Abdominal pain.  Known abdominal aortic aneurysm. EXAM: CT ANGIOGRAPHY ABDOMEN TECHNIQUE: Multidetector CT imaging of the abdomen was performed using the standard protocol during bolus administration of intravenous contrast. Multiplanar reconstructed images and MIPs were obtained and reviewed to evaluate the vascular anatomy. CONTRAST:  OMNIPAQUE IOHEXOL 350 MG/ML SOLN COMPARISON:  01/17/2021 FINDINGS: VASCULAR Aorta: Stable advanced atherosclerotic calcifications involving the aorta. There is a stable 7.8 x 7.6 cm infrarenal abdominal aortic aneurysm. This and is right at the iliac artery bifurcation. No dissection. No evidence for acute rupture. Large amount of mural thrombus is stable. Celiac: Ostial calcifications and mild ostial stenosis. SMA: Ostial calcifications and mild ostial stenosis. Renals: Ostial calcifications and mild ostial stenosis. IMA: Not identified. Inflow: Stable right common iliac artery aneurysm with maximum diameter of 2.7 cm. Veins: Grossly normal. Review of the MIP images confirms the above findings. NON-VASCULAR Lower chest: Stable significant chronic lung disease and mediastinal adenopathy. No pleural effusions. Stable thoracic aortic and coronary artery calcifications. Hepatobiliary: No hepatic lesions or intrahepatic biliary dilatation. Stable cholelithiasis. No findings for acute cholecystitis. Normal caliber common bile  duct. Pancreas: No mass, inflammation or ductal dilatation. Spleen: Normal size.  Small calcified granulomas. Adrenals/Urinary Tract: The adrenal glands and kidneys are unremarkable and stable. Stable renal cysts and renal cortical scarring changes. Stable 13 mm lesion projecting off the lower pole region of left kidney could be a complex cyst or small renal neoplasm. Stomach/Bowel: The stomach, visualized small bowel and visualized colon are unremarkable. Lymphatic: No mesenteric or retroperitoneal mass or adenopathy. Other: No ascites or abdominal wall hernia. Musculoskeletal: No significant findings. IMPRESSION: 1. Stable 7.8 x 7.6 cm infrarenal abdominal aortic aneurysm. No evidence for acute rupture. 2. Stable right common iliac artery aneurysm with maximum diameter of 2.7 cm. 3. Stable advanced atherosclerotic calcifications involving the aorta and branch vessels. 4. Stable significant chronic lung disease and mediastinal adenopathy. 5. Stable cholelithiasis. 6. Stable 13 mm lower pole left renal lesion. As before recommend follow-up MRI abdomen for further evaluation. Aortic Atherosclerosis (ICD10-I70.0). Electronically Signed   By: Rudie Meyer M.D.   On: 01/21/2021 15:36   DG Chest Port 1 View  Result Date: 01/20/2021 CLINICAL DATA:  87 year old female with a history of severe aortic stenosis EXAM: PORTABLE CHEST 1 VIEW COMPARISON:  10/05/2020, chest CT 01/17/2021 FINDINGS: Cardiomediastinal silhouette unchanged in size and contour. No pneumothorax.  No large pleural effusion. Low lung volumes persist, with persistent reticular opacities throughout the lungs, predominantly at the periphery and with no gradient  from superior to inferior. No new confluent airspace disease. Chronic deformities of the right-sided ribs. IMPRESSION: Similar appearance of low lung volumes and chronic scarring/fibrosis, without definite evidence of superimposed acute cardiopulmonary disease Electronically Signed   By: Gilmer Mor D.O.   On: 01/20/2021 13:12         Scheduled Meds:  Chlorhexidine Gluconate Cloth  6 each Topical Daily   docusate sodium  100 mg Oral BID   enoxaparin (LOVENOX) injection  40 mg Subcutaneous Q24H   feeding supplement  237 mL Oral BID BM   ipratropium-albuterol  3 mL Nebulization Q6H   midodrine  2.5 mg Oral TID WC   multivitamin with minerals  1 tablet Oral Daily   potassium chloride  40 mEq Oral BID   rifampin  300 mg Oral BID   rosuvastatin  10 mg Oral Daily   torsemide  10 mg Oral Daily   Continuous Infusions:  methocarbamol (ROBAXIN) IV     promethazine (PHENERGAN) injection (IM or IVPB)     vancomycin 1,250 mg (01/20/21 2211)     LOS: 6 days    Time spent: 35  minutes    Alberteen Sam, MD [facility] 01/21/2021, 5:35 PM     Please page though AMION or Epic secure chat:  For [employer], Higher education careers adviser

## 2021-01-22 DIAGNOSIS — I714 Abdominal aortic aneurysm, without rupture: Secondary | ICD-10-CM | POA: Diagnosis not present

## 2021-01-22 DIAGNOSIS — I5022 Chronic systolic (congestive) heart failure: Secondary | ICD-10-CM | POA: Diagnosis not present

## 2021-01-22 DIAGNOSIS — L02416 Cutaneous abscess of left lower limb: Secondary | ICD-10-CM | POA: Diagnosis not present

## 2021-01-22 LAB — BRAIN NATRIURETIC PEPTIDE: B Natriuretic Peptide: 146.7 pg/mL — ABNORMAL HIGH (ref 0.0–100.0)

## 2021-01-22 LAB — BASIC METABOLIC PANEL
Anion gap: 7 (ref 5–15)
BUN: 19 mg/dL (ref 8–23)
CO2: 35 mmol/L — ABNORMAL HIGH (ref 22–32)
Calcium: 8.6 mg/dL — ABNORMAL LOW (ref 8.9–10.3)
Chloride: 93 mmol/L — ABNORMAL LOW (ref 98–111)
Creatinine, Ser: 0.74 mg/dL (ref 0.44–1.00)
GFR, Estimated: >60 mL/min (ref >60)
Glucose, Bld: 103 mg/dL — ABNORMAL HIGH (ref 70–99)
Potassium: 3.9 mmol/L (ref 3.5–5.1)
Sodium: 135 mmol/L (ref 135–145)

## 2021-01-22 LAB — CBC
HCT: 38.7 % (ref 36.0–46.0)
Hemoglobin: 12 g/dL (ref 12.0–15.0)
MCH: 23.6 pg — ABNORMAL LOW (ref 26.0–34.0)
MCHC: 31 g/dL (ref 30.0–36.0)
MCV: 76 fL — ABNORMAL LOW (ref 80.0–100.0)
Platelets: 296 10*3/uL (ref 150–400)
RBC: 5.09 MIL/uL (ref 3.87–5.11)
RDW: 18.3 % — ABNORMAL HIGH (ref 11.5–15.5)
WBC: 6.6 10*3/uL (ref 4.0–10.5)
nRBC: 0 % (ref 0.0–0.2)

## 2021-01-22 LAB — CULTURE, BLOOD (ROUTINE X 2)
Culture: NO GROWTH
Culture: NO GROWTH
Special Requests: ADEQUATE

## 2021-01-22 NOTE — Progress Notes (Signed)
Pharmacy Antibiotic Note  Monica [name] is a 87 y.o. female admitted on 01/15/2021 with cellulitis.  Pharmacy has been consulted for Vancomycin, Cefepime  Dosing. S/p debridement of the left hip wound by  6/7.  Renal function is stable, SCr 0.74  1 Bloodcx: Staph epi- sens pending (BCID= MRSE?) Wound cx x 3 = MRSA ID following  Vanco levels 6/10 vanco 1250mg  IV given at 22:41 6/11 vanco peak = 19 mcg/ml at 01:32 6/11 vanco trough = 16 mcg/ml at 21:13  Calculated AUC of 419  Plan: Day 8 IV abx Will continue with current dosing of Vancomycin 1250mg  IV Goal AUC 400-600 Monitor renal function Plans pending for AAA repair (endovascular repair)   Height: 5\' 5"  (165.1 cm) Weight: 106.6 kg (235 lb) IBW/kg (Calculated) : 57  Temp (24hrs), Avg:98.7 F (37.1 C), Min:98.6 F (37 C), Max:98.9 F (37.2 C)  Recent Labs  Lab 01/18/21 0417 01/19/21 0416 01/20/21 0132 01/20/21 2113 01/21/21 0451 01/22/21 0452  WBC 9.3 7.9 9.5  --  5.8 6.6  CREATININE 0.91 0.97 0.82  --  0.73 0.74  VANCOTROUGH  --   --   --  16  --   --   VANCOPEAK  --   --  19*  --   --   --      Estimated Creatinine Clearance: 62.3 mL/min (by C-G formula based on SCr of 0.74 mg/dL).    No Known Allergies  Antimicrobials this admission: zosyn x1 dose 6/6 cefepime 6/7>>6/9 Vanc 6/6 (evening)>> Rifampin 6/10>>   Microbiology results:  6/6 BCx: 2of4 (1 set)= GPC=Staph epi, sens pending 6/7 Wound x 3: MRSA 6/8 Bcx: NG  Thank you for allowing pharmacy to be a part of this patient's care.  8/10, PharmD, BCPS Clinical Pharmacist 01/22/2021 8:03 AM

## 2021-01-22 NOTE — Progress Notes (Signed)
Progress Note  Patient Name: Monica [name] Date of Encounter: 01/22/2021  Primary Cardiologist: Kirke Corin  Subjective   Continues to note dyspnea, now on 11 L via nasal cannula. Notes fatigue. No chest pain or palpitations.   Inpatient Medications    Scheduled Meds:  Chlorhexidine Gluconate Cloth  6 each Topical Daily   docusate sodium  100 mg Oral BID   enoxaparin (LOVENOX) injection  40 mg Subcutaneous Q24H   feeding supplement  237 mL Oral BID BM   ipratropium-albuterol  3 mL Nebulization Q6H   midodrine  2.5 mg Oral TID WC   multivitamin with minerals  1 tablet Oral Daily   rifampin  300 mg Oral BID   rosuvastatin  10 mg Oral Daily   Continuous Infusions:  methocarbamol (ROBAXIN) IV     promethazine (PHENERGAN) injection (IM or IVPB)     vancomycin 1,250 mg (01/21/21 2223)   PRN Meds: bisacodyl, guaiFENesin-dextromethorphan, HYDROmorphone (DILAUDID) injection, ipratropium-albuterol, methocarbamol **OR** methocarbamol (ROBAXIN) IV, metoCLOPramide **OR** metoCLOPramide (REGLAN) injection, nitroGLYCERIN, ondansetron **OR** ondansetron (ZOFRAN) IV, oxyCODONE, oxyCODONE, promethazine **OR** promethazine (PHENERGAN) injection (IM or IVPB) **OR** promethazine, senna-docusate, sodium phosphate, traMADol, traZODone   Vital Signs    Vitals:   01/21/21 2031 01/22/21 0000 01/22/21 0419 01/22/21 0753  BP:   133/73 (!) 118/57  Pulse:   90 79  Resp:   20 20  Temp:   98.6 F (37 C) 98.7 F (37.1 C)  TempSrc:   Oral Oral  SpO2: 95% 94% 90% 99%  Weight:      Height:        Intake/Output Summary (Last 24 hours) at 01/22/2021 1300 Last data filed at 01/21/2021 2223 Gross per 24 hour  Intake --  Output 4200 ml  Net -4200 ml   Filed Weights   01/15/21 1638  Weight: 106.6 kg    Telemetry    Not on telemetry - Personally Reviewed  ECG    No new tracings available for review - Personally Reviewed  Physical Exam   GEN: No acute distress. Weak appearing.  Neck: No  JVD. Cardiac: RRR, III/VI systolic murmur heard throughout, no rubs, or gallops.  Respiratory: Diminished and coarse breath sounds bilaterally to anterior exam given patient weakness.  GI: Soft, nontender, non-distended.   MS: No edema; No deformity. Neuro:  Alert and oriented x 3; Nonfocal.  Psych: Normal affect.  Labs    Chemistry Recent Labs  Lab 01/15/21 1643 01/16/21 0410 01/20/21 0132 01/21/21 0451 01/22/21 0452  NA 134*   < > 134* 134* 135  K 4.1   < > 4.4 3.3* 3.9  CL 100   < > 97* 94* 93*  CO2 27   < > 28 32 35*  GLUCOSE 120*   < > 104* 95 103*  BUN 16   < > 23 19 19   CREATININE 0.80   < > 0.82 0.73 0.74  CALCIUM 8.5*   < > 9.1 8.7* 8.6*  PROT 7.8  --   --   --   --   ALBUMIN 3.3*  --   --   --   --   AST 17  --   --   --   --   ALT 8  --   --   --   --   ALKPHOS 66  --   --   --   --   BILITOT 1.3*  --   --   --   --   GFRNONAA >  60   < > >60 >60 >60  ANIONGAP 7   < > 9 8 7    < > = values in this interval not displayed.     Hematology Recent Labs  Lab 01/20/21 0132 01/21/21 0451 01/22/21 0452  WBC 9.5 5.8 6.6  RBC 5.66* 4.94 5.09  HGB 13.4 11.7* 12.0  HCT 45.1 37.4 38.7  MCV 79.7* 75.7* 76.0*  MCH 23.7* 23.7* 23.6*  MCHC 29.7* 31.3 31.0  RDW 18.4* 18.4* 18.3*  PLT 226 298 296    Cardiac EnzymesNo results for input(s): TROPONINI in the last 168 hours. No results for input(s): TROPIPOC in the last 168 hours.   BNP Recent Labs  Lab 01/21/21 0451  BNP 223.1*     DDimer  Recent Labs  Lab 01/20/21 1336  DDIMER 3.53*     Radiology    DG Chest 2 View  Result Date: 01/21/2021 IMPRESSION: Coarse bilateral reticular opacities consistent with underlying pulmonary fibrosis. Electronically Signed   By: Meda KlinefelterStephanie  Peacock MD   On: 01/21/2021 10:33   CT Angio Chest Pulmonary Embolism (PE) W or WO Contrast  Result Date: 01/20/2021 IMPRESSION: No evidence of pulmonary embolus. Mild cardiomegaly.  Evidence of pulmonary arterial hypertension.  Peripheral interstitial thickening and ground-glass opacities likely reflects fibrosis. Dependent and bibasilar atelectasis or scarring. Stable mediastinal and bilateral hilar adenopathy. Coronary artery disease. Cholelithiasis. Aortic Atherosclerosis (ICD10-I70.0). Electronically Signed   By: Charlett NoseKevin  Dover M.D.   On: 01/20/2021 19:04   CT ANGIO ABDOMEN W &/OR WO CONTRAST  Result Date: 01/21/2021 IMPRESSION: 1. Stable 7.8 x 7.6 cm infrarenal abdominal aortic aneurysm. No evidence for acute rupture. 2. Stable right common iliac artery aneurysm with maximum diameter of 2.7 cm. 3. Stable advanced atherosclerotic calcifications involving the aorta and branch vessels. 4. Stable significant chronic lung disease and mediastinal adenopathy. 5. Stable cholelithiasis. 6. Stable 13 mm lower pole left renal lesion. As before recommend follow-up MRI abdomen for further evaluation. Aortic Atherosclerosis (ICD10-I70.0). Electronically Signed   By: Rudie MeyerP.  Gallerani M.D.   On: 01/21/2021 15:36    Cardiac Studies   2D echo 10/03/2020: 1. Left ventricular ejection fraction, by estimation, is 60 to 65%. The  left ventricle has normal function. Left ventricular endocardial border  not optimally defined to evaluate regional wall motion. There is mild left  ventricular hypertrophy. Left  ventricular diastolic parameters are consistent with Grade I diastolic  dysfunction (impaired relaxation). Elevated left atrial pressure.   2. Right ventricular systolic function is normal. The right ventricular  size is mildly enlarged. Tricuspid regurgitation signal is inadequate for  assessing PA pressure.   3. Left atrial size was mildly dilated.   4. Right atrial size was mildly dilated.   5. The mitral valve is degenerative. Trivial mitral valve regurgitation.  Mild mitral stenosis. Moderate mitral annular calcification.   6. Tricuspid valve regurgitation at least moderate but suboptimally  visualized.   7. The aortic valve is  tricuspid. There is moderate calcification of the  aortic valve. There is severe thickening of the aortic valve. Aortic valve  regurgitation is not visualized. Severe aortic valve stenosis. Aortic  valve area, by VTI measures 0.51  cm. Aortic valve mean gradient measures 60.0 mmHg. Aortic valve Vmax  measures 4.83 m/s.   8. The inferior vena cava is normal in size with greater than 50%  respiratory variability, suggesting right atrial pressure of 3 mmHg.   Comparison(s): Very severe aortic stenosis, worsened compared with prior  echocardiogram from 10/26/2019.  __________  2D echo 10/26/2019: 1. Left ventricular ejection fraction, by estimation, is 60 to 65%. The  left ventricle has normal function. The left ventricle has no regional  wall motion abnormalities. There is moderate left ventricular hypertrophy.  Indeterminate diastolic filling due   to E-A fusion.   2. Right ventricular systolic function is normal. The right ventricular  size is normal. Tricuspid regurgitation signal is inadequate for assessing  PA pressure.   3. Left atrial size was mildly dilated.   4. The mitral valve is degenerative. No evidence of mitral valve  regurgitation. No evidence of mitral stenosis.   5. The aortic valve has an indeterminant number of cusps. Aortic valve  regurgitation is trivial. Moderate to severe aortic valve stenosis. Aortic  valve area, by VTI measures 0.88 cm. Aortic valve mean gradient measures  34.0 mmHg. Aortic valve Vmax  measures 3.92 m/s.   6. The inferior vena cava is normal in size with greater than 50%  respiratory variability, suggesting right atrial pressure of 3 mmHg. ___________  2D echo 04/30/2018: - Left ventricle: The cavity size was normal. There was mild    concentric hypertrophy. Systolic function was normal. The    estimated ejection fraction was in the range of 60% to 65%. Wall    motion was normal; there were no regional wall motion    abnormalities. Doppler  parameters are consistent with abnormal    left ventricular relaxation (grade 1 diastolic dysfunction).  - Aortic valve: There was moderate to severe stenosis. Mean    gradient (S): 22 mm Hg. Valve area (VTI): 0.99 cm^2.  - Left atrium: The atrium was moderately dilated.  - Pulmonary arteries: Systolic pressure was mildly increased. PA    peak pressure: 35 mm Hg (S).  - Pericardium, extracardiac: A trivial pericardial effusion was    identified posterior to the heart. ___________  2D echo 12/23/2017: - Left ventricle: The cavity size was normal. There was mild    concentric hypertrophy. Systolic function was normal. The    estimated ejection fraction was in the range of 60% to 65%. Wall    motion was normal; there were no regional wall motion    abnormalities. Doppler parameters are consistent with abnormal    left ventricular relaxation (grade 1 diastolic dysfunction).  - Aortic valve: Transvalvular velocity was increased. There was    moderate to severe stenosis by estimated AVA, moderate stenosis    by mean gradient and peak velocity. . Peak velocity (S): 376    cm/s. Mean gradient (S): 31 mm Hg. Valve area (VTI): 0.97 cm^2.  - Mitral valve: Calcified annulus.  - Left atrium: The atrium was mildly dilated.  - Right ventricle: Systolic function was normal.  - Pulmonary arteries: Systolic pressure was mildly elevated. PA    peak pressure: 43 mm Hg (S).  ___________  2D echo 06/02/2017: - Left ventricle: The cavity size was normal. There was mild    concentric hypertrophy. Systolic function was normal. The    estimated ejection fraction was in the range of 55% to 60%. Mild    hypokinesis of the basal-midinferior myocardium. Doppler    parameters are consistent with abnormal left ventricular    relaxation (grade 1 diastolic dysfunction).  - Aortic valve: There was moderate to severe stenosis. Mean    gradient (S): 21 mm Hg. Valve area (VTI): 0.99 cm^2.  - Mitral valve: Calcified  annulus. There was mild regurgitation.  - Left atrium: The atrium was mildly dilated.  ___________  LHC 06/01/2017: Mid RCA lesion, 50 %stenosed. Dist RCA lesion, 30 %stenosed. LM lesion, 20 %stenosed. Ost 1st Diag to 1st Diag lesion, 20 %stenosed. Ost Cx to Prox Cx lesion, 50 %stenosed. A drug eluting stent was successfully placed. Prox RCA to Mid RCA lesion, 100 %stenosed. Post intervention, there is a 0% residual stenosis.   1. Severe one-vessel coronary artery disease with thrombotic occlusion of the proximal and mid right coronary artery. Moderate ostial left circumflex stenosis and mild LAD disease. 2. At least moderate aortic stenosis with a peak to peak gradient of 22 mmHg. Normal left ventricular end-diastolic pressure. 3. Successful angioplasty and drug-eluting stent placement to the proximal right coronary artery extending into the midsegment. 4. Severe hypotension and bradycardia after opening the right coronary artery  likely due to neurohormonal reflects and required treatment with atropine and norepinephrine drip which was discontinued at the end of the case.   Recommendations: Dual antiplatelet therapy for at least one year. Aggressive treatment of risk factors and smoking cessation. Obtain an echocardiogram to evaluate aortic stenosis and LV systolic function. Avoid catheterization via the right radial artery in the future due to right radial loop.    Patient Profile     87 y.o. 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  Assessment & Plan    1. Preoperative cardiac risk stratification for AAA repair: -Not felt to be a candidate for open repair given significant comorbid  risk factors -Patient is high risk for noncardiac surgery, even with endovascular repair, no further testing or cardiac intervention at this time will decrease this risk -Management per vascular surgery   2. Severe aortic stenosis: -Will need TAVR evaluation as an outpatient -She remains on midodrine   3. Dyspnea: -Multifactorial including pulmonary fibrosis, severe aortic stenosis, and diastolic dysfunction -Torsemide held today in an effort to not over diurese, reassess each day -Pulmonology has been consulted for further recommendations given escalating oxygen needs and pulmonary fibrosis   4. CAD involving the native coronary arteries s/p PCI in 2018 with normal high sensitivity troponin: -Would recommend resuming PTA aspirin 81 mg daily if there are no contraindications  -PTA Crestor   5. Post surgical hip infection: -Status post debridement in the OR on 6/7 with intraoperative cultures growing MRSA -Blood cultures no growth to date x 2 at 5 days  6. Iron deficiency anemia: -Status post Feraheme 6/10 -Stable   For questions or updates, please contact [facility] Please consult [email or web address] for contact info under Cardiology/STEMI.    Signed, Eula Listen, PA-C [facility] Pager: [phone] 01/22/2021, 1:00 PM

## 2021-01-22 NOTE — TOC Progression Note (Signed)
Transition of Care [facility]) - Progression Note    Patient Details  Name: Monica [name] MRN: [id number] Date of Birth: [birth year]  [facility] CM/SW Contact  Barrie Dunker, RN Phone Number: 01/22/2021, 9:24 AM  Clinical Narrative:   [facility] has accepted the patient for Admittance at DC, once medically stable will start ins auth process    Expected Discharge Plan: Skilled Nursing Facility Barriers to Discharge: Continued Medical Work up  Expected Discharge Plan and Services Expected Discharge Plan: Skilled Nursing Facility     Post Acute Care Choice: Skilled Nursing Facility Living arrangements for the past 2 months: Single Family Home                                       Social Determinants of Health (SDOH) Interventions    Readmission Risk Interventions No flowsheet data found.

## 2021-01-22 NOTE — Consult Note (Addendum)
Pulmonary Medicine          Date: 01/22/2021,   MRN# [id number] Monica ArmourSylvia [name] February 25, 1934     AdmissionWeight: 106.6 kg                 CurrentWeight: 106.6 kg      CHIEF COMPLAINT:   Hypoxia, ground glass and pulmonary fibrosis. What to do about the cxr/chest ct findings?   HISTORY OF PRESENT ILLNESS   This is an [age] yr ol lady with the above chief complaint. Presently on 10 liters or so of oxygen. 4 days or so was on 4 liters. Chest ct noted. Hx of diastolix chf, 2/2 pulm htn, aortic stenosis, cardiology following, no pulmonary embolism. Ex smoker. Suspect copd. Recently loss husband, seem depressed and has MRSA post lef hip infection. On vanco. ID following. Chest ct looks more ground glass that fibrosis. ? Since four days ago is she having ipf flare, does not appear to be floridly wet.    PAST MEDICAL HISTORY   Past Medical History:  Diagnosis Date  . Aortic stenosis    a. LHC 06/02/17: At least moderate aortic stenosis with a peak to peak gradient of 22 mmHg; b. TTE 05/2017: EF 55-60%, mild HK basal-midinferior wall, Gr1DD, mod to sev AS w/ mean gradient 21 mmHg, valve area 0.99, mild MR, mildly dilated LA   . CAD (coronary artery disease) 2018   a. inferior STEMI 06/02/2017: LHC 06/02/17: LM 20, D1 20%, o-pLCx 50, p-mRCA 100% s/p PCI/DES, mRCA 50, dRCA 30  . GI bleed    a. noted 06/05/2017  . HLD (hyperlipidemia)   . Obesity      SURGICAL HISTORY   Past Surgical History:  Procedure Laterality Date  . ANKLE RECONSTRUCTION  1956   also ORIF of right arm  . CORONARY STENT INTERVENTION N/A 06/01/2017   Procedure: Coronary/Graft Acute MI Revascularization;  Surgeon: Iran OuchArida, Muhammad A, MD;  Location: [facility];  Service: Cardiovascular;  Laterality: N/A;  . INCISION AND DRAINAGE HIP Left 01/16/2021   Procedure: IRRIGATION AND DEBRIDEMENT HIP;  Surgeon: Signa KellPatel, Sunny, MD;  Location: [facility];  Service: Orthopedics;  Laterality: Left;  .  INTRAMEDULLARY (IM) NAIL INTERTROCHANTERIC Left 10/03/2020   Procedure: INTRAMEDULLARY (IM) NAIL INTERTROCHANTRIC;  Surgeon: Signa KellPatel, Sunny, MD;  Location: [facility];  Service: Orthopedics;  Laterality: Left;  . LEFT HEART CATH AND CORONARY ANGIOGRAPHY N/A 06/01/2017   Procedure: LEFT HEART CATH AND CORONARY ANGIOGRAPHY;  Surgeon: Iran OuchArida, Muhammad A, MD;  Location: [facility];  Service: Cardiovascular;  Laterality: N/A;     FAMILY HISTORY   Family History  Problem Relation Age of Onset  . Valvular heart disease Mother   . Diabetes Mellitus II Daughter      SOCIAL HISTORY   Social History   Tobacco Use  . Smoking status: Former    Packs/day: 0.50    Years: 50.00    Pack years: 25.00    Types: Cigarettes    Quit date: 06/01/2017    Years since quitting: 3.6  . Smokeless tobacco: Never  Vaping Use  . Vaping Use: Never used  Substance Use Topics  . Alcohol use: No  . Drug use: No     MEDICATIONS    Home Medication:  Current Outpatient Rx  . Order #: [id number] Class: Print    Current Medication:  Current Facility-Administered Medications:  .  bisacodyl (DULCOLAX) suppository 10 mg, 10 mg, Rectal, Daily PRN, Signa KellPatel, Sunny, MD .  Chlorhexidine Gluconate Cloth 2 % PADS 6 each, 6 each, Topical, Daily, Danford, Earl Lites, MD, 6 each at 01/22/21 1254 .  docusate sodium (COLACE) capsule 100 mg, 100 mg, Oral, BID, Signa Kell, MD, 100 mg at 01/22/21 1038 .  enoxaparin (LOVENOX) injection 40 mg, 40 mg, Subcutaneous, Q24H, Signa Kell, MD, 40 mg at 01/22/21 0852 .  feeding supplement (ENSURE ENLIVE / ENSURE PLUS) liquid 237 mL, 237 mL, Oral, BID BM, Signa Kell, MD, 237 mL at 01/22/21 1354 .  guaiFENesin-dextromethorphan (ROBITUSSIN DM) 100-10 MG/5ML syrup 5 mL, 5 mL, Oral, Q4H PRN, Danford, Earl Lites, MD, 5 mL at 01/20/21 1254 .  HYDROmorphone (DILAUDID) injection 0.2-0.4 mg, 0.2-0.4 mg, Intravenous, Q4H PRN, Signa Kell, MD .  ipratropium-albuterol (DUONEB)  0.5-2.5 (3) MG/3ML nebulizer solution 3 mL, 3 mL, Nebulization, Q6H, Danford, Earl Lites, MD, 3 mL at 01/22/21 1312 .  ipratropium-albuterol (DUONEB) 0.5-2.5 (3) MG/3ML nebulizer solution 3 mL, 3 mL, Nebulization, Q3H PRN, Danford, Earl Lites, MD, 3 mL at 01/21/21 2030 .  methocarbamol (ROBAXIN) tablet 500 mg, 500 mg, Oral, Q6H PRN, 500 mg at 01/19/21 1232 **OR** methocarbamol (ROBAXIN) 500 mg in dextrose 5 % 50 mL IVPB, 500 mg, Intravenous, Q6H PRN, Signa Kell, MD .  metoCLOPramide (REGLAN) tablet 5-10 mg, 5-10 mg, Oral, Q8H PRN **OR** metoCLOPramide (REGLAN) injection 5-10 mg, 5-10 mg, Intravenous, Q8H PRN, Signa Kell, MD .  midodrine (PROAMATINE) tablet 2.5 mg, 2.5 mg, Oral, TID WC, Danford, Earl Lites, MD, 2.5 mg at 01/22/21 1254 .  multivitamin with minerals tablet 1 tablet, 1 tablet, Oral, Daily, Signa Kell, MD, 1 tablet at 01/22/21 1038 .  nitroGLYCERIN (NITROSTAT) SL tablet 0.4 mg, 0.4 mg, Sublingual, Q5 min PRN, Signa Kell, MD .  ondansetron [facility]) tablet 4 mg, 4 mg, Oral, Q6H PRN, 4 mg at 01/17/21 1718 **OR** ondansetron (ZOFRAN) injection 4 mg, 4 mg, Intravenous, Q6H PRN, Signa Kell, MD, 4 mg at 01/22/21 1045 .  oxyCODONE (Oxy IR/ROXICODONE) immediate release tablet 10-15 mg, 10-15 mg, Oral, Q4H PRN, Signa Kell, MD, 10 mg at 01/20/21 0852 .  oxyCODONE (Oxy IR/ROXICODONE) immediate release tablet 5-10 mg, 5-10 mg, Oral, Q4H PRN, Signa Kell, MD, 5 mg at 01/22/21 1039 .  promethazine (PHENERGAN) tablet 25 mg, 25 mg, Oral, Q6H PRN, 25 mg at 01/18/21 0840 **OR** promethazine (PHENERGAN) 6.25 mg in sodium chloride 0.9 % 50 mL IVPB, 6.25 mg, Intravenous, Q6H PRN **OR** promethazine (PHENERGAN) suppository 25 mg, 25 mg, Rectal, Q6H PRN, Danford, Christopher P, MD .  rifampin (RIFADIN) capsule 300 mg, 300 mg, Oral, BID, Ravishankar, Jayashree, MD, 300 mg at 01/22/21 1039 .  rosuvastatin (CRESTOR) tablet 10 mg, 10 mg, Oral, Daily, Signa Kell, MD, 10 mg at 01/22/21 1038 .   senna-docusate (Senokot-S) tablet 1 tablet, 1 tablet, Oral, QHS PRN, Signa Kell, MD .  sodium phosphate (FLEET) 7-19 GM/118ML enema 1 enema, 1 enema, Rectal, Once PRN, Signa Kell, MD .  traMADol Janean Sark) tablet 50 mg, 50 mg, Oral, Q6H PRN, Signa Kell, MD, 50 mg at 01/21/21 2214 .  traZODone (DESYREL) tablet 25 mg, 25 mg, Oral, QHS PRN, Signa Kell, MD .  vancomycin (VANCOREADY) IVPB 1250 mg/250 mL, 1,250 mg, Intravenous, Q24H, Signa Kell, MD, Last Rate: 166.7 mL/hr at 01/21/21 2223, 1,250 mg at 01/21/21 2223    ALLERGIES   Patient has no known allergies.     REVIEW OF SYSTEMS    Review of Systems:  Gen:  Denies  fever, sweats, chills weigh loss  HEENT: Denies blurred vision,  double vision, ear pain, eye pain, hearing loss, nose bleeds, sore throat Cardiac:  No dizziness, chest pain or heaviness, chest tightness,edema Resp:   Denies cough or sputum porduction, ++ shortness of breath,wheezing, hemoptysis,  Gi: Denies swallowing difficulty, stomach pain, nausea or vomiting, diarrhea, constipation, bowel incontinence Gu:  Denies bladder incontinence, burning urine Ext:   Denies Joint pain, stiffness or swelling Skin: Denies  skin rash, easy bruising or bleeding or hives Endoc:  Denies polyuria, polydipsia , polyphagia or weight change Psych:   Denies depression, insomnia or hallucinations   Other:  All other systems negative   VS: BP (!) 118/57 (BP Location: Left Arm)   Pulse 79   Temp 98.7 F (37.1 C) (Oral)   Resp 20   Ht  (1.651 m)   Wt 106.6 kg   SpO2 99%   BMI 39.11 kg/m      PHYSICAL EXAM    GENERAL:NAD, no fevers, chills, no weakness no fatigue HEAD: Normocephalic, atraumatic.  EYES: Pupils equal, round, reactive to light. Extraocular muscles intact. No scleral icterus.  MOUTH: Moist mucosal membrane. Dentition intact. No abscess noted.  EAR, NOSE, THROAT: Clear without exudates. No external lesions.  NECK: Supple. No thyromegaly. No nodules. No  JVD.  PULMONARY: Diffuse coarse rhonchi right sided +wheezes CARDIOVASCULAR: S1 and S2. Regular rate and rhythm. No murmurs, rubs, or gallops. No edema. Pedal pulses 2+ bilaterally.  GASTROINTESTINAL: Soft, nontender, nondistended. No masses. Positive bowel sounds. No hepatosplenomegaly.  MUSCULOSKELETAL: No swelling, clubbing, or edema. Range of motion full in all extremities.  NEUROLOGIC: Cranial nerves II through XII are intact. No gross focal neurological deficits. Sensation intact. Reflexes intact.  SKIN: No ulceration, lesions, rashes, or cyanosis. Skin warm and dry. Turgor intact.  PSYCHIATRIC: Mood, affect within normal limits. The patient is awake, alert and oriented x 3. Insight, judgment intact.       IMAGING    DG Chest 2 View  Result Date: 01/21/2021 CLINICAL DATA:  Shortness of breath EXAM: CHEST - 2 VIEW COMPARISON:  January 20, 2021, FINDINGS: The cardiomediastinal silhouette is unchanged and enlarged in contour.Tortuous thoracic aorta. No pleural effusion. No pneumothorax. Revisualization of coarse bilateral reticular opacities most consistent with underlying pulmonary fibrosis. Visualized abdomen is unremarkable. Multilevel degenerative changes of the thoracic spine. IMPRESSION: Coarse bilateral reticular opacities consistent with underlying pulmonary fibrosis. Electronically Signed   By: Meda Klinefelter MD   On: 01/21/2021 10:33   CT Angio Chest Pulmonary Embolism (PE) W or WO Contrast  Result Date: 01/20/2021 CLINICAL DATA:  Elevated D-dimer, shortness of breath EXAM: CT ANGIOGRAPHY CHEST WITH CONTRAST TECHNIQUE: Multidetector CT imaging of the chest was performed using the standard protocol during bolus administration of intravenous contrast. Multiplanar CT image reconstructions and MIPs were obtained to evaluate the vascular anatomy. CONTRAST:  75mL OMNIPAQUE IOHEXOL 350 MG/ML SOLN COMPARISON:  01/17/2021 FINDINGS: Cardiovascular: No evidence of pulmonary embolus. Heart is  mildly enlarged. Diffuse coronary artery calcifications. Scattered moderate aortic calcifications. No aneurysm. Main pulmonary artery upper limits normal in diameter at 3.9 cm. Right and left pulmonary arteries are prominent. Findings stable. Mediastinum/Nodes: Stable mediastinal and bilateral hilar adenopathy. No axillary adenopathy. Lungs/Pleura: Peripheral ground-glass opacities and interstitial thickening likely reflects chronic lung disease. Findings are similar prior study. Dependent and bibasilar atelectasis or scarring. No effusions. No confluent opacities. Upper Abdomen: Gallstones noted within the gallbladder. The superior most aspect of the abdominal aortic aneurysm imaged. Calcifications in the spleen. No acute findings. Musculoskeletal: Chest wall soft tissues are unremarkable.  No acute bony abnormality. Review of the MIP images confirms the above findings. IMPRESSION: No evidence of pulmonary embolus. Mild cardiomegaly.  Evidence of pulmonary arterial hypertension. Peripheral interstitial thickening and ground-glass opacities likely reflects fibrosis. Dependent and bibasilar atelectasis or scarring. Stable mediastinal and bilateral hilar adenopathy. Coronary artery disease. Cholelithiasis. Aortic Atherosclerosis (ICD10-I70.0). Electronically Signed   By: Charlett Nose M.D.   On: 01/20/2021 19:04   CT HIP LEFT W CONTRAST  Result Date: 01/15/2021 CLINICAL DATA:  Drainage at left hip surgical site EXAM: CT OF THE LOWER LEFT EXTREMITY WITH CONTRAST TECHNIQUE: Multidetector CT imaging of the lower left extremity was performed according to the standard protocol following intravenous contrast administration. CONTRAST:  OMNIPAQUE IOHEXOL 300 MG/ML  SOLN COMPARISON:  Fluoroscopic images dated 10/03/2020 FINDINGS: Bones/Joint/Cartilage Status post ORIF of a comminuted intertrochanteric fracture of the left femur with antegrade long intramedullary rod and 2 proximal lag screws. No bridging bone formation  across the fracture site. Displaced greater and lesser trochanteric fracture fragments, grossly similar alignment compared to intraoperative fluoroscopic images. No appreciable hip joint effusion. No evidence of acute fracture or dislocation. No evidence to suggest hardware loosening. Visualized portion of the left hemipelvis is intact. Ligaments Suboptimally assessed by CT. Muscles and Tendons No acute musculotendinous abnormality by CT. Soft tissues Skin thickening overlies the lateral aspect of the left hip. Small fluid collection within the subcutaneous soft tissues, likely along a surgical incision site the level of the left iliac wing measuring approximately 4.1 x 0.8 x 3.2 cm (series 5, image 28). Mild subcutaneous edema throughout the lateral aspect of the left hip. Partially visualized infrarenal abdominal aortic aneurysm with extensive mural thrombus measuring approximately 6.7 x 6.1 cm (series 5, image 1). Proximal right common iliac artery aneurysm measuring approximately 2.5 cm. Extensive vascular atherosclerotic calcification. No left inguinal lymphadenopathy. IMPRESSION: 1. Status post ORIF of a comminuted intertrochanteric fracture of the left femur with displaced greater and lesser trochanteric fracture fragments, grossly similar alignment compared to intraoperative fluoroscopic images. No bridging bone formation across the fracture site. 2. Small fluid collection within the subcutaneous soft tissues, likely along a surgical incision site the level of the left iliac wing measuring approximately 4.1 x 0.8 x 3.2 cm. Findings may represent a postoperative seroma or hematoma. Infected fluid collection would be difficult to exclude by imaging alone. 3. Partially visualized infrarenal abdominal aortic aneurysm with extensive mural thrombus measuring approximately 6.7 x 6.1 cm. Recommend referral to a vascular specialist. This recommendation follows ACR consensus guidelines: White Paper of the ACR  Incidental Findings Committee II on Vascular Findings. J Am Coll Radiol 2013; 10:789-794. 4. Proximal right common iliac artery aneurysm measuring approximately 2.5 cm. Electronically Signed   By: Duanne Guess D.O.   On: 01/15/2021 18:45   CT ANGIO ABDOMEN W &/OR WO CONTRAST  Result Date: 01/21/2021 CLINICAL DATA:  Abdominal pain.  Known abdominal aortic aneurysm. EXAM: CT ANGIOGRAPHY ABDOMEN TECHNIQUE: Multidetector CT imaging of the abdomen was performed using the standard protocol during bolus administration of intravenous contrast. Multiplanar reconstructed images and MIPs were obtained and reviewed to evaluate the vascular anatomy. CONTRAST:  OMNIPAQUE IOHEXOL 350 MG/ML SOLN COMPARISON:  01/17/2021 FINDINGS: VASCULAR Aorta: Stable advanced atherosclerotic calcifications involving the aorta. There is a stable 7.8 x 7.6 cm infrarenal abdominal aortic aneurysm. This and is right at the iliac artery bifurcation. No dissection. No evidence for acute rupture. Large amount of mural thrombus is stable. Celiac: Ostial calcifications and mild ostial stenosis. SMA: Ostial  calcifications and mild ostial stenosis. Renals: Ostial calcifications and mild ostial stenosis. IMA: Not identified. Inflow: Stable right common iliac artery aneurysm with maximum diameter of 2.7 cm. Veins: Grossly normal. Review of the MIP images confirms the above findings. NON-VASCULAR Lower chest: Stable significant chronic lung disease and mediastinal adenopathy. No pleural effusions. Stable thoracic aortic and coronary artery calcifications. Hepatobiliary: No hepatic lesions or intrahepatic biliary dilatation. Stable cholelithiasis. No findings for acute cholecystitis. Normal caliber common bile duct. Pancreas: No mass, inflammation or ductal dilatation. Spleen: Normal size.  Small calcified granulomas. Adrenals/Urinary Tract: The adrenal glands and kidneys are unremarkable and stable. Stable renal cysts and renal cortical scarring  changes. Stable 13 mm lesion projecting off the lower pole region of left kidney could be a complex cyst or small renal neoplasm. Stomach/Bowel: The stomach, visualized small bowel and visualized colon are unremarkable. Lymphatic: No mesenteric or retroperitoneal mass or adenopathy. Other: No ascites or abdominal wall hernia. Musculoskeletal: No significant findings. IMPRESSION: 1. Stable 7.8 x 7.6 cm infrarenal abdominal aortic aneurysm. No evidence for acute rupture. 2. Stable right common iliac artery aneurysm with maximum diameter of 2.7 cm. 3. Stable advanced atherosclerotic calcifications involving the aorta and branch vessels. 4. Stable significant chronic lung disease and mediastinal adenopathy. 5. Stable cholelithiasis. 6. Stable 13 mm lower pole left renal lesion. As before recommend follow-up MRI abdomen for further evaluation. Aortic Atherosclerosis (ICD10-I70.0). Electronically Signed   By: Rudie Meyer M.D.   On: 01/21/2021 15:36   DG Chest Port 1 View  Result Date: 01/20/2021 CLINICAL DATA:  87 year old female with a history of severe aortic stenosis EXAM: PORTABLE CHEST 1 VIEW COMPARISON:  10/05/2020, chest CT 01/17/2021 FINDINGS: Cardiomediastinal silhouette unchanged in size and contour. No pneumothorax.  No large pleural effusion. Low lung volumes persist, with persistent reticular opacities throughout the lungs, predominantly at the periphery and with no gradient from superior to inferior. No new confluent airspace disease. Chronic deformities of the right-sided ribs. IMPRESSION: Similar appearance of low lung volumes and chronic scarring/fibrosis, without definite evidence of superimposed acute cardiopulmonary disease Electronically Signed   By: Gilmer Mor D.O.   On: 01/20/2021 13:12   CT Angio Chest/Abd/Pel for Dissection W and/or W/WO  Result Date: 01/17/2021 CLINICAL DATA:  Thoracoabdominal aortic dissection follow-up EXAM: CT ANGIOGRAPHY CHEST, ABDOMEN AND PELVIS TECHNIQUE:  Non-contrast CT of the chest was initially obtained. Multidetector CT imaging through the chest, abdomen and pelvis was performed using the standard protocol during bolus administration of intravenous contrast. Multiplanar reconstructed images and MIPs were obtained and reviewed to evaluate the vascular anatomy. CONTRAST:  OMNIPAQUE IOHEXOL 350 MG/ML SOLN COMPARISON:  CT left hip 01/15/2021 CT chest 10/05/2020 FINDINGS: CTA CHEST FINDINGS Cardiovascular: Heart size at upper limits of normal. Coronary calcifications are noted. Dilated pulmonary artery suspicious for pulmonary arterial hypertension. Mediastinum/Nodes: Hilar and mediastinal lymphadenopathy again seen. Lungs/Pleura: No focal airspace opacity to indicate pneumonia. Mild diffuse septal thickening and increased interstitial opacities are similar to prior examination. These findings are nonspecific indicator of inflammation or edema. 5 mm noncalcified nodule in the right upper lobe (image 43, series 100,000) is not significantly changed in size since the prior study. Musculoskeletal: No chest wall abnormality. No acute or significant osseous findings. Review of the MIP images confirms the above findings. CTA ABDOMEN AND PELVIS FINDINGS VASCULAR Aorta: No significant abnormality of the thoracic aorta. There is aneurysmal dilatation of the infrarenal abdominal aorta measuring up to 7.7 x 7.6 cm Celiac: Severe focal stenosis at the origin  of the celiac artery which remains patent. SMA: Severe focal stenosis at the origin of the superior mesenteric artery. Renals: Severe stenosis at the origin of the right main renal artery. Mild stenosis at the origin of the left main renal artery. IMA: Occluded at the origin. Distal branches demonstrate opacification indicative of retrograde collateral flow. Inflow: Aneurysmal dilatation of the right common iliac artery measuring up to 2.9 x 2.6 cm. 1.0 cm aneurysm seen adjacent to the right side of the rectum (image  179/series 4). This is likely originating from a rectal branch of the posterior division of the right internal iliac artery. No significant abnormality of the left inflow arteries. Veins: No obvious venous abnormality within the limitations of this arterial phase study. Review of the MIP images confirms the above findings. NON-VASCULAR Hepatobiliary: No focal hepatic lesion. Cholelithiasis. No bile duct dilatation. Pancreas: Unremarkable. Spleen: Punctate splenic calcifications likely due to prior granulomatous inflammation. Adrenals/Urinary Tract: Adrenal glands normal in appearance. Subcentimeter left renal hypodense lesions are too small to fully characterize. There is suggestion of a solid mass at the lower pole the left kidney measuring 1.2 cm in diameter. Stomach/Bowel: Hyperdense structure in the gastric lumen likely related to ingested medication. The duodenum does not cross midline suggestive of incomplete bowel rotation. No bowel dilatation to indicate ileus or obstruction. Mild distal descending colon diverticulosis without evidence of acute diverticulitis. Appendix is not definitively identified. Lymphatic: No enlarged abdominal or pelvic lymph nodes. Reproductive: Uterus and bilateral adnexa are unremarkable. Other: Small fat containing umbilical hernia. Mild rectus sheath diastasis. Nonspecific focal skin thickening of the right anterior pelvis best seen on image 168 of series 4. Musculoskeletal: Left femur fracture again seen. Proximal half of the fixation hardware of the left femur is seen. Review of the MIP images confirms the above findings. IMPRESSION: 1. Infrarenal abdominal aortic aneurysm measuring up to 7.7 x 7.6 cm. Recommend referral to a vascular specialist. This recommendation follows ACR consensus guidelines: White Paper of the ACR Incidental Findings Committee II on Vascular Findings. J Am Coll Radiol 2013; 10:789-794. 2. Aneurysm of the right common iliac artery measuring up to 2.9 x  2.6 cm. 3. 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 4. 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 5. Mediastinal and hilar lymphadenopathy similar to prior examination. This may be due to inflammation or malignancy. 6. Incompletely characterized 1.2 cm mass at the inferior tip of the left kidney should be further evaluated with contrast enhanced CT or MRI of the abdomen on nonemergent basis. 7. 5 mm right upper lobe pulmonary nodule is unchanged since prior exam. No follow-up needed if patient is low-risk. Non-contrast chest CT can be considered in 12 months if patient is high-risk. This recommendation follows the consensus statement: Guidelines for Management of Incidental Pulmonary Nodules Detected on CT Images: From the Fleischner Society 2017; Radiology 2017; 284:228-243. These results will be called to the ordering clinician or representative by the Radiologist Assistant, and communication documented in the PACS or [employer]. Electronically Signed   By: Acquanetta Belling M.D.   On: 01/17/2021 16:37      ASSESSMENT/PLAN   This is a lady who come in with post surgical MRSA left hip infection, s/p debridement on appropriate antibiotics. Also has a large AAA. For vascular procedure  Wednesday. Pulmonary wise she presented with hypoxia  D/dx: copd, pulmonary htn, aortic stenosis, pulmonary fibrosis and ground glass, ?? IPF flare. no  blood clots. Worse in the last four days.  -continue as is -sed rate, ace, ana, ra, anca, hypersensitivity pneumonitis pain, bnp -cautious diuresis, agree wih midodrine -Trial prednisone of ok with surgery (solumedrol 60 mg q 12 hrs) -dvt prophylaxis -further orders per above        Thank you for allowing me to participate in the care of this patient.   Patient/Family are satisfied with care plan and all questions have been answered.  This document was prepared using Dragon voice recognition  software and may include unintentional dictation errors.     Ned Clines, M.D.  Division of Pulmonary & Critical Care Medicine  [facility]

## 2021-01-22 NOTE — Progress Notes (Signed)
PT Cancellation Note  Patient Details Name: Takyra Cantrall MRN: [id number] DOB: 22-Jul-1934   Cancelled Treatment:    Reason Eval/Treat Not Completed: Fatigue/lethargy limiting ability to participate.  Chart reviewed.  Nurse cleared pt for therapy participation.  Per chart review, pt developed new onset abdominal pain since last seen by PT (imaging showing no change to aneurysm).  Pt also with rapid response 6/12 d/t difficulty breathing/hypoxia (pt now on 10 L O2 via HFNC).  Pt resting in bed upon PT arrival; family member present.  Pt reports being fatigued/low energy/weak and declined physical therapy session d/t this.  Pt's family member reports pt has not been eating and was wondering about Ensure or Boost nutritional supplements--secure message sent to pt's nurse regarding this.  Pt reporting no abdominal pain while therapist in pt's room.  Resting in bed, pt's O2 initially 90% on 10 L (pt appearing to be mouth breathing) but with vc's for pursed lip breathing pt's O2 sats increased to 93-94% on 10 L O2 via nasal cannula.  Will re-attempt PT treatment session at a later date/time.  Pt currently pending endovascular repair of aneurysm.  Hendricks Limes, PT 01/22/21, 10:25 AM

## 2021-01-22 NOTE — Progress Notes (Signed)
[facility] PROGRESS NOTE    Monica [name]  JWJ:[id number] DOB: 1934-01-25 DOA: 01/15/2021 PCP: Patient, No Pcp Per (Inactive)      Brief Narrative:  Monica [name] is a 87 y.o. F with CAD s/p PCI x1 in 2018, severe AS being worked up for TAVR, Obesity, pHTN, and COPD who presented with hip pain.  Had hip fracture after fall in Feb, repaired and discharged home.  Since being home 4 months ago, really never able to walk due to pain. Finally, in the last week before admission, developed redness and worse pain at left hip surgical site, then wound dehisced.  Presented to the ER and Orthopedics were consulted for suspected post-surgical hip infection.    Also noted incidentally to have very large AAA.         Assessment & Plan:  Post-surgical hip infection S/p debridement of the left hip wound by Dr. Allena Katz, 6/7  Admitted and taken to OR 6/7 for washout.  Intraoperative cultures growing MRSA in 3/3. ID consulted, recommended treating with 4 weeks antibiotics for presumed MRSA prosthetic infection.    Surgical site good, WBC normal, no fever.  -Continue vancomycin and rifampin, day 8 of 28    Acute on chronic hypoxic respiratory failure   Has chronic hypoxic respiratory failure due to aortic stenosis, heart failure, and pulmonary hypertension.  COPD is in chart but appears to be mild.   In the last 4 days, has had escalating O2 needs.  EKG unremarkable, chest x-ray unremarkable, CT angiogram of the chest shows some patchy subpleural fibrosis, no PE, moderate atelectasis, no edema or large consolidation, no effusion, no other structural problem of lungs.    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  CTA abdomen yesterday due to abdominal pain was normal.  Pain resolved today.  See progress note 6/10 - Consult vascular surgery, appreciate  cares   Severe aortic stenosis Chronic diastolic CHF Pulmonary hypertension Hypotension due to aortic stenosis BP stable on midodrine She has very poor PO intake, urine dark -Hold torsemide 1 day, reassess day to day - Continue midodrine  Coronary artery disease, secondary prevention - Continue Crestor  Iron deficiency anemia Hgb good, stable Given Feraheme 6/10 -Transfusion threshold 8 g/dL    Inactive or resolved issues: COPD Asymptomatic, no flare  Hyponatremia Mild, asymptomatic  Positive blood culture 1/2 admission cultures positive for staph epidermidis.  We suspect this is a contaminant.  In carefully considering the relative risk of graft infection versus urgency of 8 cm aneurysm repair, I agree with vascular surgery to continue IV antibiotics for 7 days and proceed with repair next Wednesday  Obesity BMI 39  Vaginal candidiasis Given Fluconazole 1 dose on 6/10, and foley placed for 48 hours  Left renal nodule Incidental finding, minor, given gravity of AAA and AS and hypoxia, this likely has not been discussed with patient and should be followed up only depending on goals of care after AAA repair - Follow-up MRI as an outpatient         Disposition: Status is: Inpatient  Remains inpatient appropriate because: She requires ongoing IV antibiotics, and upcoming urgent endovascular AAA repair on Wednesday.   Dispo: The patient is from: Home              Anticipated d/c is to: SNF  Patient currently is not medically stable to d/c.   Difficult to place patient No       Level of care: Med-Surg       MDM: The below labs and imaging reports were reviewed and summarized above.  Medication management as above.    DVT prophylaxis: enoxaparin (LOVENOX) injection 40 mg Start: 01/17/21 0800 SCDs Start: 01/16/21 1611  Code Status: FULL Family Communication: Grandson at the bedside           Subjective: No chest pain.   Has nausea and severe fatigue and inability to get up out of bed, lack of willingness to get out of bed.  No confusion, respiratory distress, diarrhea.      Objective: Vitals:   01/21/21 2031 01/22/21 0000 01/22/21 0419 01/22/21 0753  BP:   133/73 (!) 118/57  Pulse:   90 79  Resp:   20 20  Temp:   98.6 F (37 C) 98.7 F (37.1 C)  TempSrc:   Oral Oral  SpO2: 95% 94% 90% 99%  Weight:      Height:        Intake/Output Summary (Last 24 hours) at 01/22/2021 1058 Last data filed at 01/21/2021 2223 Gross per 24 hour  Intake --  Output 4200 ml  Net -4200 ml   Filed Weights   01/15/21 1638  Weight: 106.6 kg    Examination: General appearance: Obese elderly female, lying in bed, debilitated     HEENT: Anicteric, gingiva pink, lids and lashes normal.  Partially edentulous, lips normal, oropharynx dry, no oral lesions, hearing normal Skin: Left hip has a little blister from Tegaderm, but otherwise looks like it is healing well, has no tenderness, there is no change in the groin rash Cardiac: RRR, systolic murmur, no lower extremity edema Respiratory: Respiratory rate somewhat high, lung sounds diminished, wheezing in posterior fields, no rales Abdomen: Abdomen soft without tenderness palpation or guarding, no ascites or distention MSK:  Neuro: Awake and alert, extraocular movements intact, moves upper extremities with severe generalized weakness, but symmetric, face symmetric and speech fluent Psych: Sensorium intact, responding to questions appropriately, attention normal, affect flat, judgment insight appear normal         Data Reviewed: I have personally reviewed following labs and imaging studies:  CBC: Recent Labs  Lab 01/18/21 0417 01/19/21 0416 01/20/21 0132 01/21/21 0451 01/22/21 0452  WBC 9.3 7.9 9.5 5.8 6.6  HGB 11.2* 10.8* 13.4 11.7* 12.0  HCT 37.3 35.3* 45.1 37.4 38.7  MCV 78.4* 77.8* 79.7* 75.7* 76.0*  PLT 276 279 226 298 296   Basic Metabolic  Panel: Recent Labs  Lab 01/18/21 0417 01/19/21 0416 01/20/21 0132 01/21/21 0451 01/22/21 0452  NA 134* 134* 134* 134* 135  K 4.3 4.2 4.4 3.3* 3.9  CL 103 99 97* 94* 93*  CO2 23 28 28  32 35*  GLUCOSE 119* 84 104* 95 103*  BUN 21 19 23 19 19   CREATININE 0.91 0.97 0.82 0.73 0.74  CALCIUM 8.6* 8.7* 9.1 8.7* 8.6*   GFR: Estimated Creatinine Clearance: 62.3 mL/min (by C-G formula based on SCr of 0.74 mg/dL). Liver Function Tests: Recent Labs  Lab 01/15/21 1643  AST 17  ALT 8  ALKPHOS 66  BILITOT 1.3*  PROT 7.8  ALBUMIN 3.3*   No results for input(s): LIPASE, AMYLASE in the last 168 hours. No results for input(s): AMMONIA in the last 168 hours. Coagulation Profile: No results for input(s): INR, PROTIME in the last 168 hours. Cardiac Enzymes: No  results for input(s): CKTOTAL, CKMB, CKMBINDEX, TROPONINI in the last 168 hours. BNP (last 3 results) No results for input(s): PROBNP in the last 8760 hours. HbA1C: No results for input(s): HGBA1C in the last 72 hours. CBG: No results for input(s): GLUCAP in the last 168 hours. Lipid Profile: No results for input(s): CHOL, HDL, LDLCALC, TRIG, CHOLHDL, LDLDIRECT in the last 72 hours. Thyroid Function Tests: No results for input(s): TSH, T4TOTAL, FREET4, T3FREE, THYROIDAB in the last 72 hours. Anemia Panel: No results for input(s): VITAMINB12, FOLATE, FERRITIN, TIBC, IRON, RETICCTPCT in the last 72 hours.  Urine analysis:    Component Value Date/Time   COLORURINE YELLOW (A) 04/29/2018 1357   APPEARANCEUR CLEAR (A) 04/29/2018 1357   LABSPEC 1.010 04/29/2018 1357   PHURINE 7.0 04/29/2018 1357   GLUCOSEU NEGATIVE 04/29/2018 1357   HGBUR SMALL (A) 04/29/2018 1357   BILIRUBINUR NEGATIVE 04/29/2018 1357   KETONESUR 5 (A) 04/29/2018 1357   PROTEINUR NEGATIVE 04/29/2018 1357   NITRITE NEGATIVE 04/29/2018 1357   LEUKOCYTESUR NEGATIVE 04/29/2018 1357   Sepsis Labs: @LABRCNTIP (procalcitonin:4,lacticacidven:4)  ) Recent Results  (from the past 240 hour(s))  Blood culture (routine x 2)     Status: Abnormal   Collection Time: 01/15/21  4:43 PM   Specimen: BLOOD LEFT HAND  Result Value Ref Range Status   Specimen Description   Final    BLOOD LEFT HAND Performed at [facility], [address] [place], [place] [address]    Special Requests   Final    BOTTLES DRAWN AEROBIC AND ANAEROBIC Blood Culture adequate volume Performed at [facility], [address] [place], [place] [address]    Culture  Setup Time   Final    Organism ID to follow GRAM POSITIVE COCCI AEROBIC BOTTLE ONLY CRITICAL RESULT CALLED TO, READ BACK BY AND VERIFIED WITH: AMY THOMPSON AT [id number] ON 01/16/2021 [facility]. Performed at [facility], [address] [place], [place] [address]    Culture (A)  Final    STAPHYLOCOCCUS EPIDERMIDIS THE SIGNIFICANCE OF ISOLATING THIS ORGANISM FROM A SINGLE SET OF BLOOD CULTURES WHEN MULTIPLE SETS ARE DRAWN IS UNCERTAIN. PLEASE NOTIFY THE MICROBIOLOGY DEPARTMENT WITHIN ONE WEEK IF SPECIATION AND SENSITIVITIES ARE REQUIRED. Performed at [facility], [address] [place], [place] [address]    Report Status 01/18/2021 FINAL  Final  Blood culture (routine x 2)     Status: None   Collection Time: 01/15/21  4:43 PM   Specimen: BLOOD  Result Value Ref Range Status   Specimen Description BLOOD BLOOD RIGHT WRIST  Final   Special Requests   Final    BOTTLES DRAWN AEROBIC AND ANAEROBIC Blood Culture results may not be optimal due to an inadequate volume of blood received in culture bottles   Culture   Final    NO GROWTH 5 DAYS Performed at [facility], [address] [place], [place] [address]    Report Status 01/20/2021 FINAL  Final  Blood Culture ID Panel (Reflexed)     Status: Abnormal   Collection Time: 01/15/21  4:43 PM  Result Value Ref Range Status   Enterococcus faecalis NOT DETECTED NOT DETECTED Final   Enterococcus Faecium NOT DETECTED NOT DETECTED Final    Listeria monocytogenes NOT DETECTED NOT DETECTED Final   Staphylococcus species DETECTED (A) NOT DETECTED Final    Comment: CRITICAL RESULT CALLED TO, READ BACK BY AND VERIFIED WITH: AMY THOMPSON AT [id number] ON 01/16/2021 [facility].    Staphylococcus aureus (BCID) NOT  DETECTED NOT DETECTED Final   Staphylococcus epidermidis DETECTED (A) NOT DETECTED Final    Comment: Methicillin (oxacillin) resistant coagulase negative staphylococcus. Possible blood culture contaminant (unless isolated from more than one blood culture draw or clinical case suggests pathogenicity). No antibiotic treatment is indicated for blood  culture contaminants. CRITICAL RESULT CALLED TO, READ BACK BY AND VERIFIED WITH: AMY THOMPSON AT 1610 ON 01/16/2021 [facility].    Staphylococcus lugdunensis NOT DETECTED NOT DETECTED Final   Streptococcus species NOT DETECTED NOT DETECTED Final   Streptococcus agalactiae NOT DETECTED NOT DETECTED Final   Streptococcus pneumoniae NOT DETECTED NOT DETECTED Final   Streptococcus pyogenes NOT DETECTED NOT DETECTED Final   A.calcoaceticus-baumannii NOT DETECTED NOT DETECTED Final   Bacteroides fragilis NOT DETECTED NOT DETECTED Final   Enterobacterales NOT DETECTED NOT DETECTED Final   Enterobacter cloacae complex NOT DETECTED NOT DETECTED Final   Escherichia coli NOT DETECTED NOT DETECTED Final   Klebsiella aerogenes NOT DETECTED NOT DETECTED Final   Klebsiella oxytoca NOT DETECTED NOT DETECTED Final   Klebsiella pneumoniae NOT DETECTED NOT DETECTED Final   Proteus species NOT DETECTED NOT DETECTED Final   Salmonella species NOT DETECTED NOT DETECTED Final   Serratia marcescens NOT DETECTED NOT DETECTED Final   Haemophilus influenzae NOT DETECTED NOT DETECTED Final   Neisseria meningitidis NOT DETECTED NOT DETECTED Final   Pseudomonas aeruginosa NOT DETECTED NOT DETECTED Final   Stenotrophomonas maltophilia NOT DETECTED NOT DETECTED Final   Candida albicans NOT DETECTED NOT DETECTED Final   Candida  auris NOT DETECTED NOT DETECTED Final   Candida glabrata NOT DETECTED NOT DETECTED Final   Candida krusei NOT DETECTED NOT DETECTED Final   Candida parapsilosis NOT DETECTED NOT DETECTED Final   Candida tropicalis NOT DETECTED NOT DETECTED Final   Cryptococcus neoformans/gattii NOT DETECTED NOT DETECTED Final   Methicillin resistance mecA/C DETECTED (A) NOT DETECTED Final    Comment: CRITICAL RESULT CALLED TO, READ BACK BY AND VERIFIED WITH: AMY THOMPSON AT 9604 ON 01/16/2021 [facility]. Performed at [facility], [address] [place], [place] [address]   Resp Panel by RT-PCR (Flu A&B, Covid) Nasopharyngeal Swab     Status: None   Collection Time: 01/15/21  8:24 PM   Specimen: Nasopharyngeal Swab; Nasopharyngeal(NP) swabs in vial transport medium  Result Value Ref Range Status   SARS Coronavirus 2 by RT PCR NEGATIVE NEGATIVE Final    Comment: (NOTE) SARS-CoV-2 target nucleic acids are NOT DETECTED.  The SARS-CoV-2 RNA is generally detectable in upper respiratory specimens during the acute phase of infection. The lowest concentration of SARS-CoV-2 viral copies this assay can detect is 138 copies/mL. A negative result does not preclude SARS-Cov-2 infection and should not be used as the sole basis for treatment or other patient management decisions. A negative result may occur with  improper specimen collection/handling, submission of specimen other than nasopharyngeal swab, presence of viral mutation(s) within the areas targeted by this assay, and inadequate number of viral copies(<138 copies/mL). A negative result must be combined with clinical observations, patient history, and epidemiological information. The expected result is Negative.  Fact Sheet for Patients:  BloggerCourse.com  Fact Sheet for Healthcare Providers:  SeriousBroker.it  This test is no t yet approved or cleared by the [place] FDA and  has been  authorized for detection and/or diagnosis of SARS-CoV-2 by FDA under an Emergency Use Authorization (EUA). This EUA will remain  in effect (meaning this test can be used) for the duration of the COVID-19 declaration under  Section 564(b)(1) of the Act, 21 U.S.C.section 360bbb-3(b)(1), unless the authorization is terminated  or revoked sooner.       Influenza A by PCR NEGATIVE NEGATIVE Final   Influenza B by PCR NEGATIVE NEGATIVE Final    Comment: (NOTE) The Xpert Xpress SARS-CoV-2/FLU/RSV plus assay is intended as an aid in the diagnosis of influenza from Nasopharyngeal swab specimens and should not be used as a sole basis for treatment. Nasal washings and aspirates are unacceptable for Xpert Xpress SARS-CoV-2/FLU/RSV testing.  Fact Sheet for Patients: BloggerCourse.com  Fact Sheet for Healthcare Providers: SeriousBroker.it  This test is not yet approved or cleared by the [place] FDA and has been authorized for detection and/or diagnosis of SARS-CoV-2 by FDA under an Emergency Use Authorization (EUA). This EUA will remain in effect (meaning this test can be used) for the duration of the COVID-19 declaration under Section 564(b)(1) of the Act, 21 U.S.C. section 360bbb-3(b)(1), unless the authorization is terminated or revoked.  Performed at [facility], [address] [place], [place] [address]   Aerobic/Anaerobic Culture w Gram Stain (surgical/deep wound)     Status: None   Collection Time: 01/16/21  2:25 PM   Specimen: Wound; Abscess  Result Value Ref Range Status   Specimen Description   Final    WOUND Performed at [facility], [address] [place], [place] [address]    Special Requests   Final    LEFT HIP Performed at [facility], [address] [place], [place] [address]    Gram Stain   Final    RARE WBC PRESENT,BOTH PMN AND MONONUCLEAR NO ORGANISMS SEEN    Culture    Final    RARE METHICILLIN RESISTANT STAPHYLOCOCCUS AUREUS NO ANAEROBES ISOLATED Performed at [facility], [address] [place], [place] [address]    Report Status 01/21/2021 FINAL  Final   Organism ID, Bacteria METHICILLIN RESISTANT STAPHYLOCOCCUS AUREUS  Final      Susceptibility   Methicillin resistant staphylococcus aureus - MIC*    CIPROFLOXACIN 4 RESISTANT Resistant     ERYTHROMYCIN >=8 RESISTANT Resistant     GENTAMICIN <=0.5 SENSITIVE Sensitive     OXACILLIN >=4 RESISTANT Resistant     TETRACYCLINE <=1 SENSITIVE Sensitive     VANCOMYCIN <=0.5 SENSITIVE Sensitive     TRIMETH/SULFA <=10 SENSITIVE Sensitive     CLINDAMYCIN <=0.25 SENSITIVE Sensitive     RIFAMPIN <=0.5 SENSITIVE Sensitive     Inducible Clindamycin NEGATIVE Sensitive     * RARE METHICILLIN RESISTANT STAPHYLOCOCCUS AUREUS  Aerobic/Anaerobic Culture w Gram Stain (surgical/deep wound)     Status: None   Collection Time: 01/16/21  2:25 PM   Specimen: Wound; Abscess  Result Value Ref Range Status   Specimen Description   Final    WOUND Performed at [facility], [address] [place], [place] [address]    Special Requests LEFT HIP  Final   Gram Stain   Final    FEW WBC PRESENT, PREDOMINANTLY PMN NO ORGANISMS SEEN    Culture   Final    RARE METHICILLIN RESISTANT STAPHYLOCOCCUS AUREUS NO ANAEROBES ISOLATED Performed at [facility], [address], [place], [place] [address]    Report Status 01/21/2021 FINAL  Final   Organism ID, Bacteria METHICILLIN RESISTANT STAPHYLOCOCCUS AUREUS  Final      Susceptibility   Methicillin resistant staphylococcus aureus - MIC*    CIPROFLOXACIN 4 RESISTANT Resistant     ERYTHROMYCIN >=8 RESISTANT Resistant  GENTAMICIN <=0.5 SENSITIVE Sensitive     OXACILLIN >=4 RESISTANT Resistant     TETRACYCLINE <=1 SENSITIVE Sensitive     VANCOMYCIN <=0.5 SENSITIVE Sensitive     TRIMETH/SULFA <=10 SENSITIVE Sensitive     CLINDAMYCIN <=0.25 SENSITIVE  Sensitive     RIFAMPIN <=0.5 SENSITIVE Sensitive     Inducible Clindamycin NEGATIVE Sensitive     * RARE METHICILLIN RESISTANT STAPHYLOCOCCUS AUREUS  Aerobic/Anaerobic Culture w Gram Stain (surgical/deep wound)     Status: None   Collection Time: 01/16/21  2:26 PM   Specimen: Wound; Abscess  Result Value Ref Range Status   Specimen Description   Final    WOUND Performed at [facility], [address], [place], [place] [address]    Special Requests   Final    LEFT HIP Performed at [facility], [address] [place], [place] [address]    Gram Stain NO WBC SEEN NO ORGANISMS SEEN   Final   Culture   Final    RARE METHICILLIN RESISTANT STAPHYLOCOCCUS AUREUS NO ANAEROBES ISOLATED Performed at [facility], [address], [place], [place] [address]    Report Status 01/21/2021 FINAL  Final   Organism ID, Bacteria METHICILLIN RESISTANT STAPHYLOCOCCUS AUREUS  Final      Susceptibility   Methicillin resistant staphylococcus aureus - MIC*    CIPROFLOXACIN 4 RESISTANT Resistant     ERYTHROMYCIN >=8 RESISTANT Resistant     GENTAMICIN <=0.5 SENSITIVE Sensitive     OXACILLIN >=4 RESISTANT Resistant     TETRACYCLINE <=1 SENSITIVE Sensitive     VANCOMYCIN 1 SENSITIVE Sensitive     TRIMETH/SULFA <=10 SENSITIVE Sensitive     CLINDAMYCIN <=0.25 SENSITIVE Sensitive     RIFAMPIN <=0.5 SENSITIVE Sensitive     Inducible Clindamycin NEGATIVE Sensitive     * RARE METHICILLIN RESISTANT STAPHYLOCOCCUS AUREUS  CULTURE, BLOOD (ROUTINE X 2) w Reflex to ID Panel     Status: None   Collection Time: 01/17/21 12:10 PM   Specimen: BLOOD  Result Value Ref Range Status   Specimen Description BLOOD BLOOD RIGHT HAND  Final   Special Requests   Final    BOTTLES DRAWN AEROBIC AND ANAEROBIC Blood Culture results may not be optimal due to an excessive volume of blood received in culture bottles   Culture   Final    NO GROWTH 5 DAYS Performed at [facility], [address] [place], [place] [address]    Report Status 01/22/2021 FINAL  Final  CULTURE, BLOOD (ROUTINE X 2) w Reflex to ID Panel     Status: None   Collection Time: 01/17/21 12:13 PM   Specimen: BLOOD LEFT HAND  Result Value Ref Range Status   Specimen Description BLOOD LEFT HAND  Final   Special Requests   Final    BOTTLES DRAWN AEROBIC AND ANAEROBIC Blood Culture adequate volume   Culture   Final    NO GROWTH 5 DAYS Performed at [facility], [address] [place], [place] [address]    Report Status 01/22/2021 FINAL  Final         Radiology Studies: DG Chest 2 View  Result Date: 01/21/2021 CLINICAL DATA:  Shortness of breath EXAM: CHEST - 2 VIEW COMPARISON:  January 20, 2021, FINDINGS: The cardiomediastinal silhouette is unchanged and enlarged in contour.Tortuous thoracic aorta. No pleural effusion. No pneumothorax. Revisualization of coarse bilateral reticular opacities most consistent with underlying pulmonary fibrosis. Visualized abdomen is unremarkable. Multilevel degenerative changes of  the thoracic spine. IMPRESSION: Coarse bilateral reticular opacities consistent with underlying pulmonary fibrosis. Electronically Signed   By: Meda Klinefelter MD   On: 01/21/2021 10:33   CT Angio Chest Pulmonary Embolism (PE) W or WO Contrast  Result Date: 01/20/2021 CLINICAL DATA:  Elevated D-dimer, shortness of breath EXAM: CT ANGIOGRAPHY CHEST WITH CONTRAST TECHNIQUE: Multidetector CT imaging of the chest was performed using the standard protocol during bolus administration of intravenous contrast. Multiplanar CT image reconstructions and MIPs were obtained to evaluate the vascular anatomy. CONTRAST:  53mL OMNIPAQUE IOHEXOL 350 MG/ML SOLN COMPARISON:  01/17/2021 FINDINGS: Cardiovascular: No evidence of pulmonary embolus. Heart is mildly enlarged. Diffuse coronary artery calcifications. Scattered moderate aortic calcifications. No aneurysm. Main pulmonary artery upper limits normal in  diameter at 3.9 cm. Right and left pulmonary arteries are prominent. Findings stable. Mediastinum/Nodes: Stable mediastinal and bilateral hilar adenopathy. No axillary adenopathy. Lungs/Pleura: Peripheral ground-glass opacities and interstitial thickening likely reflects chronic lung disease. Findings are similar prior study. Dependent and bibasilar atelectasis or scarring. No effusions. No confluent opacities. Upper Abdomen: Gallstones noted within the gallbladder. The superior most aspect of the abdominal aortic aneurysm imaged. Calcifications in the spleen. No acute findings. Musculoskeletal: Chest wall soft tissues are unremarkable. No acute bony abnormality. Review of the MIP images confirms the above findings. IMPRESSION: No evidence of pulmonary embolus. Mild cardiomegaly.  Evidence of pulmonary arterial hypertension. Peripheral interstitial thickening and ground-glass opacities likely reflects fibrosis. Dependent and bibasilar atelectasis or scarring. Stable mediastinal and bilateral hilar adenopathy. Coronary artery disease. Cholelithiasis. Aortic Atherosclerosis (ICD10-I70.0). Electronically Signed   By: Charlett Nose M.D.   On: 01/20/2021 19:04   CT ANGIO ABDOMEN W &/OR WO CONTRAST  Result Date: 01/21/2021 CLINICAL DATA:  Abdominal pain.  Known abdominal aortic aneurysm. EXAM: CT ANGIOGRAPHY ABDOMEN TECHNIQUE: Multidetector CT imaging of the abdomen was performed using the standard protocol during bolus administration of intravenous contrast. Multiplanar reconstructed images and MIPs were obtained and reviewed to evaluate the vascular anatomy. CONTRAST:  OMNIPAQUE IOHEXOL 350 MG/ML SOLN COMPARISON:  01/17/2021 FINDINGS: VASCULAR Aorta: Stable advanced atherosclerotic calcifications involving the aorta. There is a stable 7.8 x 7.6 cm infrarenal abdominal aortic aneurysm. This and is right at the iliac artery bifurcation. No dissection. No evidence for acute rupture. Large amount of mural thrombus  is stable. Celiac: Ostial calcifications and mild ostial stenosis. SMA: Ostial calcifications and mild ostial stenosis. Renals: Ostial calcifications and mild ostial stenosis. IMA: Not identified. Inflow: Stable right common iliac artery aneurysm with maximum diameter of 2.7 cm. Veins: Grossly normal. Review of the MIP images confirms the above findings. NON-VASCULAR Lower chest: Stable significant chronic lung disease and mediastinal adenopathy. No pleural effusions. Stable thoracic aortic and coronary artery calcifications. Hepatobiliary: No hepatic lesions or intrahepatic biliary dilatation. Stable cholelithiasis. No findings for acute cholecystitis. Normal caliber common bile duct. Pancreas: No mass, inflammation or ductal dilatation. Spleen: Normal size.  Small calcified granulomas. Adrenals/Urinary Tract: The adrenal glands and kidneys are unremarkable and stable. Stable renal cysts and renal cortical scarring changes. Stable 13 mm lesion projecting off the lower pole region of left kidney could be a complex cyst or small renal neoplasm. Stomach/Bowel: The stomach, visualized small bowel and visualized colon are unremarkable. Lymphatic: No mesenteric or retroperitoneal mass or adenopathy. Other: No ascites or abdominal wall hernia. Musculoskeletal: No significant findings. IMPRESSION: 1. Stable 7.8 x 7.6 cm infrarenal abdominal aortic aneurysm. No evidence for acute rupture. 2. Stable right common iliac artery aneurysm with maximum diameter of 2.7  cm. 3. Stable advanced atherosclerotic calcifications involving the aorta and branch vessels. 4. Stable significant chronic lung disease and mediastinal adenopathy. 5. Stable cholelithiasis. 6. Stable 13 mm lower pole left renal lesion. As before recommend follow-up MRI abdomen for further evaluation. Aortic Atherosclerosis (ICD10-I70.0). Electronically Signed   By: Rudie Meyer M.D.   On: 01/21/2021 15:36   DG Chest Port 1 View  Result Date: 01/20/2021 CLINICAL  DATA:  87 year old female with a history of severe aortic stenosis EXAM: PORTABLE CHEST 1 VIEW COMPARISON:  10/05/2020, chest CT 01/17/2021 FINDINGS: Cardiomediastinal silhouette unchanged in size and contour. No pneumothorax.  No large pleural effusion. Low lung volumes persist, with persistent reticular opacities throughout the lungs, predominantly at the periphery and with no gradient from superior to inferior. No new confluent airspace disease. Chronic deformities of the right-sided ribs. IMPRESSION: Similar appearance of low lung volumes and chronic scarring/fibrosis, without definite evidence of superimposed acute cardiopulmonary disease Electronically Signed   By: Gilmer Mor D.O.   On: 01/20/2021 13:12         Scheduled Meds:  Chlorhexidine Gluconate Cloth  6 each Topical Daily   docusate sodium  100 mg Oral BID   enoxaparin (LOVENOX) injection  40 mg Subcutaneous Q24H   feeding supplement  237 mL Oral BID BM   ipratropium-albuterol  3 mL Nebulization Q6H   midodrine  2.5 mg Oral TID WC   multivitamin with minerals  1 tablet Oral Daily   rifampin  300 mg Oral BID   rosuvastatin  10 mg Oral Daily   Continuous Infusions:  methocarbamol (ROBAXIN) IV     promethazine (PHENERGAN) injection (IM or IVPB)     vancomycin 1,250 mg (01/21/21 2223)     LOS: 7 days    Time spent: 35 minutes    Alberteen Sam, MD [facility] 01/22/2021, 10:58 AM     Please page though AMION or Epic secure chat:  For [employer], Higher education careers adviser

## 2021-01-22 NOTE — TOC Progression Note (Signed)
Transition of Care [facility]) - Progression Note    Patient Details  Name: Monica [name] MRN: [id number] Date of Birth: 21-Dec-1933  Transition of Care [facility]) CM/SW Contact  Barrie Dunker, RN Phone Number: 01/22/2021, 9:19 AM  Clinical Narrative:   Patient prefers [facility], Reached out to Tammy at [facility] and requested that they accept the patient at DC, awaiting a response    Expected Discharge Plan: Skilled Nursing Facility Barriers to Discharge: Continued Medical Work up  Expected Discharge Plan and Services Expected Discharge Plan: Skilled Nursing Facility     Post Acute Care Choice: Skilled Nursing Facility Living arrangements for the past 2 months: Single Family Home                                       Social Determinants of Health (SDOH) Interventions    Readmission Risk Interventions No flowsheet data found.

## 2021-01-22 NOTE — Progress Notes (Signed)
Patient is now POD #6 s/p left hip postoperative wound irrigation and debridement.  Patient states that the pain of the left Regeneten is significant improved compared to her preoperative status.  However, currently her primary symptom is vague abdominal pain decreased appetite and generalized feelings of weakness.  On exam, the patient is appropriately responsive.  The wound VAC is in place with appropriate seal.  The prior surrounding erythema has significantly improved.  Recommendations: 1.  Wound cultures growing MRSA.  Continue IV antibiotics per ID team with presumption that there could be bony/hardware involvement given uncertainty and necessity of aneurysm repair, although intraoperative findings were not necessarily suggestive of this.  2.  Patient is able to weight-bear as tolerated on the left lower extremity.  Recommend PT/OT as able.  3.  Can change Prevena to portable device at the time of discharge.  We will plan to remove stitches approximately 2 weeks after surgery as an outpatient.  Alternatively, home health/SNF facility can remove stitches if appropriate as patient has mobility issues at baseline which preclude outpatient appointments.  4.  We will plan to follow peripherally.  Please contact me with any further questions.

## 2021-01-22 NOTE — Progress Notes (Signed)
   Date of Admission:  01/15/2021      Subjective: C/o lower abdominal pain Appetite poor Sob- on nasal cannula Had CTA yesterday  Medications:   Chlorhexidine Gluconate Cloth  6 each Topical Daily   docusate sodium  100 mg Oral BID   enoxaparin (LOVENOX) injection  40 mg Subcutaneous Q24H   feeding supplement  237 mL Oral BID BM   ipratropium-albuterol  3 mL Nebulization Q6H   midodrine  2.5 mg Oral TID WC   multivitamin with minerals  1 tablet Oral Daily   rifampin  300 mg Oral BID   rosuvastatin  10 mg Oral Daily   torsemide  10 mg Oral Daily    Objective: Vital signs in last 24 hours: Temp:  [98.6 F (37 C)-98.9 F (37.2 C)] 98.7 F (37.1 C) (06/13 0753) Pulse Rate:  [79-94] 79 (06/13 0753) Resp:  [17-20] 20 (06/13 0753) BP: (105-143)/(57-73) 118/57 (06/13 0753) SpO2:  [89 %-99 %] 99 % (06/13 0753)  PHYSICAL EXAM:  General: Alert, some distress, appears stated age.  Head: Normocephalic, without obvious abnormality, atraumatic. Eyes: Conjunctivae clear, anicteric sclerae. Pupils are equal Lungs: B/L air entry Heart: harsh systolic murmur aortic area Abdomen: did not palpate .  Extremities: atraumatic, no cyanosis. No edema. No clubbing Skin: No rashes or lesions. Or bruising Lymph: Cervical, supraclavicular normal. Neurologic: Grossly non-focal  Lab Results Recent Labs    01/21/21 0451 01/22/21 0452  WBC 5.8 6.6  HGB 11.7* 12.0  HCT 37.4 38.7  NA 134* 135  K 3.3* 3.9  CL 94* 93*  CO2 32 35*  BUN 19 19  CREATININE 0.73 0.74  Microbiology:  Studies/Results:  Stable 7.8 x 7.6 cm infrarenal abdominal aortic aneurysm. No evidence for acute rupture. 2. Stable right common iliac artery aneurysm with maximum diameter of 2.7 cm.  CT chest   Peripheral interstitial thickening and ground-glass opacities likely reflects fibrosis. Dependent and bibasilar atelectasis or scarring.   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 Concern for infection of the bone. Currently on IV vancomycin and p.o. rifampin because of presence of hardware.  Abdominal aortic aneurysm measuring 8 cm.  Surgery planned for Wednesday Shortness of breath secondary to interstitial lung disease.  He will be started on prednisone  Severe aortic stenosis followed by cardiology  Abdominal pain.  Unclear cause.  Rule out drug related pain.  observe closely  Discussed the management with the patient and care team.

## 2021-01-23 DIAGNOSIS — Z7189 Other specified counseling: Secondary | ICD-10-CM

## 2021-01-23 DIAGNOSIS — I5022 Chronic systolic (congestive) heart failure: Secondary | ICD-10-CM | POA: Diagnosis not present

## 2021-01-23 DIAGNOSIS — R0902 Hypoxemia: Secondary | ICD-10-CM

## 2021-01-23 LAB — CBC
HCT: 40.2 % (ref 36.0–46.0)
Hemoglobin: 12.9 g/dL (ref 12.0–15.0)
MCH: 23.8 pg — ABNORMAL LOW (ref 26.0–34.0)
MCHC: 32.1 g/dL (ref 30.0–36.0)
MCV: 74.2 fL — ABNORMAL LOW (ref 80.0–100.0)
Platelets: 287 10*3/uL (ref 150–400)
RBC: 5.42 MIL/uL — ABNORMAL HIGH (ref 3.87–5.11)
RDW: 19.6 % — ABNORMAL HIGH (ref 11.5–15.5)
WBC: 5.7 10*3/uL (ref 4.0–10.5)
nRBC: 0 % (ref 0.0–0.2)

## 2021-01-23 LAB — BASIC METABOLIC PANEL
Anion gap: 12 (ref 5–15)
BUN: 22 mg/dL (ref 8–23)
CO2: 33 mmol/L — ABNORMAL HIGH (ref 22–32)
Calcium: 8.8 mg/dL — ABNORMAL LOW (ref 8.9–10.3)
Chloride: 89 mmol/L — ABNORMAL LOW (ref 98–111)
Creatinine, Ser: 0.77 mg/dL (ref 0.44–1.00)
GFR, Estimated: >60 mL/min (ref >60)
Glucose, Bld: 113 mg/dL — ABNORMAL HIGH (ref 70–99)
Potassium: 3.5 mmol/L (ref 3.5–5.1)
Sodium: 134 mmol/L — ABNORMAL LOW (ref 135–145)

## 2021-01-23 LAB — HEPATIC FUNCTION PANEL
ALT: 11 U/L (ref 0–44)
AST: 26 U/L (ref 15–41)
Albumin: 3 g/dL — ABNORMAL LOW (ref 3.5–5.0)
Alkaline Phosphatase: 60 U/L (ref 38–126)
Bilirubin, Direct: 0.6 mg/dL — ABNORMAL HIGH (ref 0.0–0.2)
Indirect Bilirubin: 1.3 mg/dL — ABNORMAL HIGH (ref 0.3–0.9)
Total Bilirubin: 1.9 mg/dL — ABNORMAL HIGH (ref 0.3–1.2)
Total Protein: 7.6 g/dL (ref 6.5–8.1)

## 2021-01-23 MED ORDER — RIFAMPIN 300 MG PO CAPS
300.0000 mg | ORAL_CAPSULE | Freq: Two times a day (BID) | ORAL | Status: DC
  Administered 2021-01-23 – 2021-02-04 (×20): 300 mg via ORAL
  Filled 2021-01-23 (×28): qty 1

## 2021-01-23 MED ORDER — LIP MEDEX EX OINT
1.0000 "application " | TOPICAL_OINTMENT | CUTANEOUS | Status: DC | PRN
  Filled 2021-01-23 (×3): qty 7

## 2021-01-23 MED ORDER — FUROSEMIDE 10 MG/ML IJ SOLN
40.0000 mg | Freq: Once | INTRAMUSCULAR | Status: AC
  Administered 2021-01-23: 40 mg via INTRAVENOUS
  Filled 2021-01-23: qty 4

## 2021-01-23 MED ORDER — CEFAZOLIN SODIUM-DEXTROSE 2-4 GM/100ML-% IV SOLN
2.0000 g | INTRAVENOUS | Status: AC
  Administered 2021-01-24 (×2): 2 g via INTRAVENOUS
  Filled 2021-01-23: qty 100

## 2021-01-23 MED ORDER — METHYLPREDNISOLONE SODIUM SUCC 125 MG IJ SOLR
60.0000 mg | Freq: Two times a day (BID) | INTRAMUSCULAR | Status: DC
  Administered 2021-01-23 (×2): 60 mg via INTRAVENOUS
  Administered 2021-01-24: 125 mg via INTRAVENOUS
  Administered 2021-01-24 – 2021-01-27 (×6): 60 mg via INTRAVENOUS
  Filled 2021-01-23 (×8): qty 2

## 2021-01-23 MED ORDER — POTASSIUM CHLORIDE CRYS ER 20 MEQ PO TBCR
40.0000 meq | EXTENDED_RELEASE_TABLET | Freq: Once | ORAL | Status: AC
  Administered 2021-01-23: 40 meq via ORAL
  Filled 2021-01-23: qty 2

## 2021-01-23 NOTE — Progress Notes (Signed)
Pulmonary Medicine            HISTORY OF PRESENT ILLNESS   This is an 87 yr ol lady with the above chief complaint. Presently on 10 liters or so of oxygen. 4 days or so was on 4 liters. Chest ct noted. Hx of diastolix chf, 2/2 pulm htn, aortic stenosis, cardiology following, no pulmonary embolism. Ex smoker. Suspect copd. Recently loss husband, seem depressed and has MRSA post lef hip infection. On vanco. ID following. Chest ct looks more ground glass that fibrosis.    Oxygen requirement no worse since last pm. No new compliants.   PAST MEDICAL HISTORY   Past Medical History:  Diagnosis Date  . Aortic stenosis    a. LHC 06/02/17: At least moderate aortic stenosis with a peak to peak gradient of 22 mmHg; b. TTE 05/2017: EF 55-60%, mild HK basal-midinferior wall, Gr1DD, mod to sev AS w/ mean gradient 21 mmHg, valve area 0.99, mild MR, mildly dilated LA   . CAD (coronary artery disease) 2018   a. inferior STEMI 06/02/2017: LHC 06/02/17: LM 20, D1 20%, o-pLCx 50, p-mRCA 100% s/p PCI/DES, mRCA 50, dRCA 30  . GI bleed    a. noted 06/05/2017  . HLD (hyperlipidemia)   . Obesity      SURGICAL HISTORY   Past Surgical History:  Procedure Laterality Date  . ANKLE RECONSTRUCTION  1956   also ORIF of right arm  . CORONARY STENT INTERVENTION N/A 06/01/2017   Procedure: Coronary/Graft Acute MI Revascularization;  Surgeon: Iran Ouch, MD;  Location: [facility];  Service: Cardiovascular;  Laterality: N/A;  . INCISION AND DRAINAGE HIP Left 01/16/2021   Procedure: IRRIGATION AND DEBRIDEMENT HIP;  Surgeon: Signa Kell, MD;  Location: [facility] OR[facility];  Service: Orthopedics;  Laterality: Left;  . INTRAMEDULLARY (IM) NAIL INTERTROCHANTERIC Left 10/03/2020   Procedure: INTRAMEDULLARY (IM) NAIL INTERTROCHANTRIC;  Surgeon: Signa Kell, MD;  Location: [facility] OR[facility];  Service: Orthopedics;  Laterality: Left;  . LEFT HEART CATH AND CORONARY ANGIOGRAPHY N/A 06/01/2017   Procedure: LEFT  HEART CATH AND CORONARY ANGIOGRAPHY;  Surgeon: Iran Ouch, MD;  Location: [facility];  Service: Cardiovascular;  Laterality: N/A;     FAMILY HISTORY   Family History  Problem Relation Age of Onset  . Valvular heart disease Mother   . Diabetes Mellitus II Daughter      SOCIAL HISTORY   Social History   Tobacco Use  . Smoking status: Former    Packs/day: 0.50    Years: 50.00    Pack years: 25.00    Types: Cigarettes    Quit date: 06/01/2017    Years since quitting: 3.6  . Smokeless tobacco: Never  Vaping Use  . Vaping Use: Never used  Substance Use Topics  . Alcohol use: No  . Drug use: No     MEDICATIONS    Home Medication:  Current Outpatient Rx  . Order #: [id number] Class: Print    Current Medication:  Current Facility-Administered Medications:  .  bisacodyl (DULCOLAX) suppository 10 mg, 10 mg, Rectal, Daily PRN, Signa Kell, MD .  Chlorhexidine Gluconate Cloth 2 % PADS 6 each, 6 each, Topical, Daily, Danford, Earl Lites, MD, 6 each at 01/22/21 1254 .  docusate sodium (COLACE) capsule 100 mg, 100 mg, Oral, BID, Signa Kell, MD, 100 mg at 01/23/21 0936 .  enoxaparin (LOVENOX) injection 40 mg, 40 mg, Subcutaneous, Q24H, Signa Kell, MD, 40 mg at 01/23/21 0936 .  feeding  supplement (ENSURE ENLIVE / ENSURE PLUS) liquid 237 mL, 237 mL, Oral, BID BM, Signa Kell, MD, 237 mL at 01/22/21 1354 .  guaiFENesin-dextromethorphan (ROBITUSSIN DM) 100-10 MG/5ML syrup 5 mL, 5 mL, Oral, Q4H PRN, Danford, Earl Lites, MD, 5 mL at 01/20/21 1254 .  HYDROmorphone (DILAUDID) injection 0.2-0.4 mg, 0.2-0.4 mg, Intravenous, Q4H PRN, Signa Kell, MD .  ipratropium-albuterol (DUONEB) 0.5-2.5 (3) MG/3ML nebulizer solution 3 mL, 3 mL, Nebulization, Q6H, Danford, Earl Lites, MD, 3 mL at 01/23/21 0739 .  ipratropium-albuterol (DUONEB) 0.5-2.5 (3) MG/3ML nebulizer solution 3 mL, 3 mL, Nebulization, Q3H PRN, Danford, Earl Lites, MD, 3 mL at 01/21/21 2030 .  lip  balm (CARMEX) ointment 1 application, 1 application, Topical, PRN, Danford, Earl Lites, MD .  methocarbamol (ROBAXIN) tablet 500 mg, 500 mg, Oral, Q6H PRN, 500 mg at 01/19/21 1232 **OR** methocarbamol (ROBAXIN) 500 mg in dextrose 5 % 50 mL IVPB, 500 mg, Intravenous, Q6H PRN, Signa Kell, MD .  methylPREDNISolone sodium succinate (SOLU-MEDROL) 125 mg/2 mL injection 60 mg, 60 mg, Intravenous, Q12H, Danford, Earl Lites, MD, 60 mg at 01/23/21 0937 .  metoCLOPramide (REGLAN) tablet 5-10 mg, 5-10 mg, Oral, Q8H PRN, 10 mg at 01/23/21 0447 **OR** metoCLOPramide (REGLAN) injection 5-10 mg, 5-10 mg, Intravenous, Q8H PRN, Signa Kell, MD .  midodrine (PROAMATINE) tablet 2.5 mg, 2.5 mg, Oral, TID WC, Danford, Earl Lites, MD, 2.5 mg at 01/22/21 1756 .  multivitamin with minerals tablet 1 tablet, 1 tablet, Oral, Daily, Signa Kell, MD, 1 tablet at 01/23/21 0936 .  nitroGLYCERIN (NITROSTAT) SL tablet 0.4 mg, 0.4 mg, Sublingual, Q5 min PRN, Signa Kell, MD .  ondansetron [facility]) tablet 4 mg, 4 mg, Oral, Q6H PRN, 4 mg at 01/17/21 1718 **OR** ondansetron (ZOFRAN) injection 4 mg, 4 mg, Intravenous, Q6H PRN, Signa Kell, MD, 4 mg at 01/23/21 0153 .  oxyCODONE (Oxy IR/ROXICODONE) immediate release tablet 10-15 mg, 10-15 mg, Oral, Q4H PRN, Signa Kell, MD, 10 mg at 01/20/21 0852 .  oxyCODONE (Oxy IR/ROXICODONE) immediate release tablet 5-10 mg, 5-10 mg, Oral, Q4H PRN, Signa Kell, MD, 5 mg at 01/22/21 1039 .  promethazine (PHENERGAN) tablet 25 mg, 25 mg, Oral, Q6H PRN, 25 mg at 01/18/21 0840 **OR** promethazine (PHENERGAN) 6.25 mg in sodium chloride 0.9 % 50 mL IVPB, 6.25 mg, Intravenous, Q6H PRN **OR** promethazine (PHENERGAN) suppository 25 mg, 25 mg, Rectal, Q6H PRN, Danford, Earl Lites, MD, 25 mg at 01/23/21 1104 .  rifampin (RIFADIN) capsule 300 mg, 300 mg, Oral, BID, Ravishankar, Jayashree, MD, 300 mg at 01/23/21 0936 .  rosuvastatin (CRESTOR) tablet 10 mg, 10 mg, Oral, Daily, Signa Kell, MD, 10 mg  at 01/23/21 0936 .  senna-docusate (Senokot-S) tablet 1 tablet, 1 tablet, Oral, QHS PRN, Signa Kell, MD .  sodium phosphate (FLEET) 7-19 GM/118ML enema 1 enema, 1 enema, Rectal, Once PRN, Signa Kell, MD .  traMADol Janean Sark) tablet 50 mg, 50 mg, Oral, Q6H PRN, Signa Kell, MD, 50 mg at 01/21/21 2214 .  traZODone (DESYREL) tablet 25 mg, 25 mg, Oral, QHS PRN, Signa Kell, MD .  vancomycin (VANCOREADY) IVPB 1250 mg/250 mL, 1,250 mg, Intravenous, Q24H, Signa Kell, MD, Last Rate: 166.7 mL/hr at 01/22/21 2249, 1,250 mg at 01/22/21 2249    ALLERGIES   Patient has no known allergies.     REVIEW OF SYSTEMS    Review of Systems:  Gen:  Denies  fever, sweats, chills weigh loss  HEENT: Denies blurred vision, double vision, ear pain, eye pain, hearing loss, nose bleeds, sore throat  Cardiac:  No dizziness, chest pain or heaviness, chest tightness,edema Resp:   Denies cough or sputum porduction, ++ shortness of breath,wheezing, hemoptysis,  Gi: Denies swallowing difficulty, stomach pain, nausea or vomiting, diarrhea, constipation, bowel incontinence Gu:  Denies bladder incontinence, burning urine Ext:   Denies Joint pain, stiffness or swelling Skin: Denies  skin rash, easy bruising or bleeding or hives Endoc:  Denies polyuria, polydipsia , polyphagia or weight change Psych:   Denies depression, insomnia or hallucinations   Other:  All other systems negative   VS: BP (!) 143/62 (BP Location: Right Arm)   Pulse 98   Temp 98.3 F (36.8 C) (Oral)   Resp 14   Ht 5\' 5"  (1.651 m)   Wt 106.6 kg   SpO2 96%   BMI 39.11 kg/m      PHYSICAL EXAM    GENERAL:laying semi erect, 11 liters via [place] HEAD: Normocephalic, atraumatic.  EYES: Pupils equal, round, reactive to light. Extraocular muscles intact. No scleral icterus.  MOUTH: Moist mucosal membrane. Dentition intact. No abscess noted.  EAR, NOSE, THROAT: Clear without exudates. No external lesions.  NECK: Supple. No thyromegaly. No  nodules. No JVD.  PULMONARY: Diffuse rales CARDIOVASCULAR: S1 and S2. Regular rate and rhythm. No murmurs, rubs, or gallops. No edema. Pedal pulses 2+ bilaterally.  GASTROINTESTINAL: Soft, nontender, nondistended. No masses. Positive bowel sounds. No hepatosplenomegaly.  MUSCULOSKELETAL: No swelling, clubbing, or edema. Range of motion full in all extremities.  NEUROLOGIC: Cranial nerves II through XII are intact. No gross focal neurological deficits. Sensation intact. Reflexes intact.  SKIN: No ulceration, lesions, rashes, or cyanosis. Skin warm and dry. Turgor intact.  PSYCHIATRIC: Mood, affect within normal limits. The patient is awake, alert and oriented x 3. Insight, judgment intact.       IMAGING    DG Chest 2 View  Result Date: 01/21/2021 CLINICAL DATA:  Shortness of breath EXAM: CHEST - 2 VIEW COMPARISON:  January 20, 2021, FINDINGS: The cardiomediastinal silhouette is unchanged and enlarged in contour.Tortuous thoracic aorta. No pleural effusion. No pneumothorax. Revisualization of coarse bilateral reticular opacities most consistent with underlying pulmonary fibrosis. Visualized abdomen is unremarkable. Multilevel degenerative changes of the thoracic spine. IMPRESSION: Coarse bilateral reticular opacities consistent with underlying pulmonary fibrosis. Electronically Signed   By: Meda KlinefelterStephanie  Peacock MD   On: 01/21/2021 10:33   CT Angio Chest Pulmonary Embolism (PE) W or WO Contrast  Result Date: 01/20/2021 CLINICAL DATA:  Elevated D-dimer, shortness of breath EXAM: CT ANGIOGRAPHY CHEST WITH CONTRAST TECHNIQUE: Multidetector CT imaging of the chest was performed using the standard protocol during bolus administration of intravenous contrast. Multiplanar CT image reconstructions and MIPs were obtained to evaluate the vascular anatomy. CONTRAST:  75mL OMNIPAQUE IOHEXOL 350 MG/ML SOLN COMPARISON:  01/17/2021 FINDINGS: Cardiovascular: No evidence of pulmonary embolus. Heart is mildly enlarged.  Diffuse coronary artery calcifications. Scattered moderate aortic calcifications. No aneurysm. Main pulmonary artery upper limits normal in diameter at 3.9 cm. Right and left pulmonary arteries are prominent. Findings stable. Mediastinum/Nodes: Stable mediastinal and bilateral hilar adenopathy. No axillary adenopathy. Lungs/Pleura: Peripheral ground-glass opacities and interstitial thickening likely reflects chronic lung disease. Findings are similar prior study. Dependent and bibasilar atelectasis or scarring. No effusions. No confluent opacities. Upper Abdomen: Gallstones noted within the gallbladder. The superior most aspect of the abdominal aortic aneurysm imaged. Calcifications in the spleen. No acute findings. Musculoskeletal: Chest wall soft tissues are unremarkable. No acute bony abnormality. Review of the MIP images confirms the above findings. IMPRESSION: No evidence of  pulmonary embolus. Mild cardiomegaly.  Evidence of pulmonary arterial hypertension. Peripheral interstitial thickening and ground-glass opacities likely reflects fibrosis. Dependent and bibasilar atelectasis or scarring. Stable mediastinal and bilateral hilar adenopathy. Coronary artery disease. Cholelithiasis. Aortic Atherosclerosis (ICD10-I70.0). Electronically Signed   By: Charlett Nose M.D.   On: 01/20/2021 19:04   CT HIP LEFT W CONTRAST  Result Date: 01/15/2021 CLINICAL DATA:  Drainage at left hip surgical site EXAM: CT OF THE LOWER LEFT EXTREMITY WITH CONTRAST TECHNIQUE: Multidetector CT imaging of the lower left extremity was performed according to the standard protocol following intravenous contrast administration. CONTRAST:  OMNIPAQUE IOHEXOL 300 MG/ML  SOLN COMPARISON:  Fluoroscopic images dated 10/03/2020 FINDINGS: Bones/Joint/Cartilage Status post ORIF of a comminuted intertrochanteric fracture of the left femur with antegrade long intramedullary rod and 2 proximal lag screws. No bridging bone formation across the  fracture site. Displaced greater and lesser trochanteric fracture fragments, grossly similar alignment compared to intraoperative fluoroscopic images. No appreciable hip joint effusion. No evidence of acute fracture or dislocation. No evidence to suggest hardware loosening. Visualized portion of the left hemipelvis is intact. Ligaments Suboptimally assessed by CT. Muscles and Tendons No acute musculotendinous abnormality by CT. Soft tissues Skin thickening overlies the lateral aspect of the left hip. Small fluid collection within the subcutaneous soft tissues, likely along a surgical incision site the level of the left iliac wing measuring approximately 4.1 x 0.8 x 3.2 cm (series 5, image 28). Mild subcutaneous edema throughout the lateral aspect of the left hip. Partially visualized infrarenal abdominal aortic aneurysm with extensive mural thrombus measuring approximately 6.7 x 6.1 cm (series 5, image 1). Proximal right common iliac artery aneurysm measuring approximately 2.5 cm. Extensive vascular atherosclerotic calcification. No left inguinal lymphadenopathy. IMPRESSION: 1. Status post ORIF of a comminuted intertrochanteric fracture of the left femur with displaced greater and lesser trochanteric fracture fragments, grossly similar alignment compared to intraoperative fluoroscopic images. No bridging bone formation across the fracture site. 2. Small fluid collection within the subcutaneous soft tissues, likely along a surgical incision site the level of the left iliac wing measuring approximately 4.1 x 0.8 x 3.2 cm. Findings may represent a postoperative seroma or hematoma. Infected fluid collection would be difficult to exclude by imaging alone. 3. Partially visualized infrarenal abdominal aortic aneurysm with extensive mural thrombus measuring approximately 6.7 x 6.1 cm. Recommend referral to a vascular specialist. This recommendation follows ACR consensus guidelines: White Paper of the ACR Incidental Findings  Committee II on Vascular Findings. J Am Coll Radiol 2013; 10:789-794. 4. Proximal right common iliac artery aneurysm measuring approximately 2.5 cm. Electronically Signed   By: Duanne Guess D.O.   On: 01/15/2021 18:45   CT ANGIO ABDOMEN W &/OR WO CONTRAST  Result Date: 01/21/2021 CLINICAL DATA:  Abdominal pain.  Known abdominal aortic aneurysm. EXAM: CT ANGIOGRAPHY ABDOMEN TECHNIQUE: Multidetector CT imaging of the abdomen was performed using the standard protocol during bolus administration of intravenous contrast. Multiplanar reconstructed images and MIPs were obtained and reviewed to evaluate the vascular anatomy. CONTRAST:  OMNIPAQUE IOHEXOL 350 MG/ML SOLN COMPARISON:  01/17/2021 FINDINGS: VASCULAR Aorta: Stable advanced atherosclerotic calcifications involving the aorta. There is a stable 7.8 x 7.6 cm infrarenal abdominal aortic aneurysm. This and is right at the iliac artery bifurcation. No dissection. No evidence for acute rupture. Large amount of mural thrombus is stable. Celiac: Ostial calcifications and mild ostial stenosis. SMA: Ostial calcifications and mild ostial stenosis. Renals: Ostial calcifications and mild ostial stenosis. IMA: Not identified. Inflow: Stable  right common iliac artery aneurysm with maximum diameter of 2.7 cm. Veins: Grossly normal. Review of the MIP images confirms the above findings. NON-VASCULAR Lower chest: Stable significant chronic lung disease and mediastinal adenopathy. No pleural effusions. Stable thoracic aortic and coronary artery calcifications. Hepatobiliary: No hepatic lesions or intrahepatic biliary dilatation. Stable cholelithiasis. No findings for acute cholecystitis. Normal caliber common bile duct. Pancreas: No mass, inflammation or ductal dilatation. Spleen: Normal size.  Small calcified granulomas. Adrenals/Urinary Tract: The adrenal glands and kidneys are unremarkable and stable. Stable renal cysts and renal cortical scarring changes. Stable 13 mm  lesion projecting off the lower pole region of left kidney could be a complex cyst or small renal neoplasm. Stomach/Bowel: The stomach, visualized small bowel and visualized colon are unremarkable. Lymphatic: No mesenteric or retroperitoneal mass or adenopathy. Other: No ascites or abdominal wall hernia. Musculoskeletal: No significant findings. IMPRESSION: 1. Stable 7.8 x 7.6 cm infrarenal abdominal aortic aneurysm. No evidence for acute rupture. 2. Stable right common iliac artery aneurysm with maximum diameter of 2.7 cm. 3. Stable advanced atherosclerotic calcifications involving the aorta and branch vessels. 4. Stable significant chronic lung disease and mediastinal adenopathy. 5. Stable cholelithiasis. 6. Stable 13 mm lower pole left renal lesion. As before recommend follow-up MRI abdomen for further evaluation. Aortic Atherosclerosis (ICD10-I70.0). Electronically Signed   By: Rudie Meyer M.D.   On: 01/21/2021 15:36   DG Chest Port 1 View  Result Date: 01/20/2021 CLINICAL DATA:  87 year old female with a history of severe aortic stenosis EXAM: PORTABLE CHEST 1 VIEW COMPARISON:  10/05/2020, chest CT 01/17/2021 FINDINGS: Cardiomediastinal silhouette unchanged in size and contour. No pneumothorax.  No large pleural effusion. Low lung volumes persist, with persistent reticular opacities throughout the lungs, predominantly at the periphery and with no gradient from superior to inferior. No new confluent airspace disease. Chronic deformities of the right-sided ribs. IMPRESSION: Similar appearance of low lung volumes and chronic scarring/fibrosis, without definite evidence of superimposed acute cardiopulmonary disease Electronically Signed   By: Gilmer Mor D.O.   On: 01/20/2021 13:12   CT Angio Chest/Abd/Pel for Dissection W and/or W/WO  Result Date: 01/17/2021 CLINICAL DATA:  Thoracoabdominal aortic dissection follow-up EXAM: CT ANGIOGRAPHY CHEST, ABDOMEN AND PELVIS TECHNIQUE: Non-contrast CT of the chest  was initially obtained. Multidetector CT imaging through the chest, abdomen and pelvis was performed using the standard protocol during bolus administration of intravenous contrast. Multiplanar reconstructed images and MIPs were obtained and reviewed to evaluate the vascular anatomy. CONTRAST:  OMNIPAQUE IOHEXOL 350 MG/ML SOLN COMPARISON:  CT left hip 01/15/2021 CT chest 10/05/2020 FINDINGS: CTA CHEST FINDINGS Cardiovascular: Heart size at upper limits of normal. Coronary calcifications are noted. Dilated pulmonary artery suspicious for pulmonary arterial hypertension. Mediastinum/Nodes: Hilar and mediastinal lymphadenopathy again seen. Lungs/Pleura: No focal airspace opacity to indicate pneumonia. Mild diffuse septal thickening and increased interstitial opacities are similar to prior examination. These findings are nonspecific indicator of inflammation or edema. 5 mm noncalcified nodule in the right upper lobe (image 43, series 100,000) is not significantly changed in size since the prior study. Musculoskeletal: No chest wall abnormality. No acute or significant osseous findings. Review of the MIP images confirms the above findings. CTA ABDOMEN AND PELVIS FINDINGS VASCULAR Aorta: No significant abnormality of the thoracic aorta. There is aneurysmal dilatation of the infrarenal abdominal aorta measuring up to 7.7 x 7.6 cm Celiac: Severe focal stenosis at the origin of the celiac artery which remains patent. SMA: Severe focal stenosis at the origin of the superior  mesenteric artery. Renals: Severe stenosis at the origin of the right main renal artery. Mild stenosis at the origin of the left main renal artery. IMA: Occluded at the origin. Distal branches demonstrate opacification indicative of retrograde collateral flow. Inflow: Aneurysmal dilatation of the right common iliac artery measuring up to 2.9 x 2.6 cm. 1.0 cm aneurysm seen adjacent to the right side of the rectum (image 179/series 4). This is likely  originating from a rectal branch of the posterior division of the right internal iliac artery. No significant abnormality of the left inflow arteries. Veins: No obvious venous abnormality within the limitations of this arterial phase study. Review of the MIP images confirms the above findings. NON-VASCULAR Hepatobiliary: No focal hepatic lesion. Cholelithiasis. No bile duct dilatation. Pancreas: Unremarkable. Spleen: Punctate splenic calcifications likely due to prior granulomatous inflammation. Adrenals/Urinary Tract: Adrenal glands normal in appearance. Subcentimeter left renal hypodense lesions are too small to fully characterize. There is suggestion of a solid mass at the lower pole the left kidney measuring 1.2 cm in diameter. Stomach/Bowel: Hyperdense structure in the gastric lumen likely related to ingested medication. The duodenum does not cross midline suggestive of incomplete bowel rotation. No bowel dilatation to indicate ileus or obstruction. Mild distal descending colon diverticulosis without evidence of acute diverticulitis. Appendix is not definitively identified. Lymphatic: No enlarged abdominal or pelvic lymph nodes. Reproductive: Uterus and bilateral adnexa are unremarkable. Other: Small fat containing umbilical hernia. Mild rectus sheath diastasis. Nonspecific focal skin thickening of the right anterior pelvis best seen on image 168 of series 4. Musculoskeletal: Left femur fracture again seen. Proximal half of the fixation hardware of the left femur is seen. Review of the MIP images confirms the above findings. IMPRESSION: 1. Infrarenal abdominal aortic aneurysm measuring up to 7.7 x 7.6 cm. Recommend referral to a vascular specialist. This recommendation follows ACR consensus guidelines: White Paper of the ACR Incidental Findings Committee II on Vascular Findings. J Am Coll Radiol 2013; 10:789-794. 2. 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 3. 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 4. 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 5. Mediastinal and hilar lymphadenopathy similar to prior examination. This may be due to inflammation or malignancy. 6. Incompletely characterized 1.2 cm mass at the inferior tip of the left kidney should be further evaluated with contrast enhanced CT or MRI of the abdomen on nonemergent basis. 7. 5 mm right upper lobe pulmonary nodule is unchanged since prior exam. No follow-up needed if patient is low-risk. Non-contrast chest CT can be considered in 12 months if patient is high-risk. This recommendation follows the consensus statement: Guidelines for Management of Incidental Pulmonary Nodules Detected on CT Images: From the Fleischner Society 2017; Radiology 2017; 284:228-243. These results will be called to the ordering clinician or representative by the Radiologist Assistant, and communication documented in the PACS or [employer]. Electronically Signed   By: Acquanetta Belling M.D.   On: 01/17/2021 16:37    BNP 146  ASSESSMENT/PLAN   This is a lady who come in with post surgical MRSA left hip infection, s/p debridement on appropriate antibiotics. Also has a large AAA. For vascular procedure  Wednesday. Pulmonary wise she presented with hypoxia  D/dx: copd, pulmonary htn, aortic stenosis, pulmonary fibrosis and ground glass, ?? IPF flare??? Pulm edema.  At risk of developing ARDS. .No blood clots. No worse since last pm -continue  as is - ace, ana, ra, anca, hypersensitivity pneumonitis pain, pending -cautious diuresis, as per cardiology -steroids started -dvt prophylaxis -right and left heart cath would be helpful -wean fio2 as tolerated ) keep sats 91-92 % -further orders per above     Thank you for allowing me to participate in the care of this patient.   Patient/Family are satisfied with care plan and all questions have been answered.  This document  was prepared using Dragon voice recognition software and may include unintentional dictation errors.     Ned Clines, M.D.  Division of Pulmonary & Critical Care Medicine  [facility]

## 2021-01-23 NOTE — Consult Note (Addendum)
Consultation Note Date: 01/23/2021   Patient Name: Monica [name]  DOB: 1934-07-06  MRN: [id number]  Age / Sex: 87 y.o., female  PCP: Patient, No Pcp Per (Inactive) Referring Physician: Alberteen Sam, *  Reason for Consultation: Establishing goals of care  HPI/Patient Profile: Latese Dufault is a 87 y.o. Caucasian female with medical history significant for coronary artery disease, aortic stenosis and dyslipidemia who underwent left hip intramedullary nailing over 3 months ago by Dr. Allena Katz and presents to the emergency room with acute onset of left hip pain and swelling, tenderness, pain, warmth and erythema.  Clinical Assessment and Goals of Care: Patient is resting in bed on 8-9 lpm of O2. She feels SOB and is somewhat breathless in conversation. She states she lives at home and her daughter lives with her. She states she had been doing fairly well until she developed the infection in her op site.   We discussed in detail her multiple complex diagnosis, prognosis, GOC, EOL wishes disposition and options.  A detailed discussion was had today regarding advanced directives.  Concepts specific to code status, artifical feeding and hydration, IV antibiotics and rehospitalization were discussed.  The difference between an aggressive medical intervention path and a comfort care path was discussed.  Values and goals of care important to patient and family were attempted to be elicited.  Discussed limitations of medical interventions to prolong quality of life in some situations and discussed the concept of human mortality.  She states she has a living will. Her son would be her primary Management consultant. She states she would never want to be placed on the ventilator. She would not want CPR. She would not want a feeding tube, and would not want to live in a LTCF. Discussed the need for care and follow up  postoperatively and the recommendations for further follow up and procedures. She states she is continuing life prolonging care for her grandson whom she raised from infancy.   Stepped out and called son. He confirms HPOA and living will. He discusses the dynamics of the family and living arrangements. He states she has not seen a doctor in many years. He states he understands all of the current health issues and is able to articulate them well. He states he understands she has a very hard road ahead of her in the best scenario. He states at best she was able to stand, turn, and pivot prior to her op site infection. Son tells me she will not want to follow up.   We discussed her diagnosis, prognosis, GOC, EOL wishes disposition and options.  A detailed discussion was had today regarding advanced directives.  Concepts specific to code status, artifical feeding and hydration, IV antibiotics and rehospitalization were discussed.  The difference between an aggressive medical intervention path and a comfort care path was discussed.  Values and goals of care important to patient and family were attempted to be elicited.  Discussed limitations of medical interventions to prolong quality of life in some situations and discussed the  concept of human mortality.  He understands a comfort path as her husband died a year ago, and would like to discuss this with her further prior to surgery.  He agrees with all of her decisions on code status, vent support, feeding tube. He shares concern for her QOL.      I completed a MOST form today and the signed original was placed in the chart. A photocopy was placed in the chart to be scanned into EMR. The patient outlined their wishes for the following treatment decisions:  Cardiopulmonary Resuscitation: Do Not Attempt Resuscitation (DNR/No CPR)  Medical Interventions: Limited Additional Interventions: Use medical treatment, IV fluids and cardiac monitoring as indicated, DO  NOT USE intubation or mechanical ventilation. May consider use of less invasive airway support such as BiPAP or CPAP. Also provide comfort measures. Transfer to the hospital if indicated. Avoid intensive care.   Antibiotics: Antibiotics if indicated  IV Fluids: IV fluids if indicated  Feeding Tube: No feeding tube       SUMMARY OF RECOMMENDATIONS   DNR/DNI, no feeding tube. Son to speak with patient further.        Primary Diagnoses: Present on Admission:  Abscess of left hip   I have reviewed the medical record, interviewed the patient and family, and examined the patient. The following aspects are pertinent.  Past Medical History:  Diagnosis Date   Aortic stenosis    a. LHC 06/02/17: At least moderate aortic stenosis with a peak to peak gradient of 22 mmHg; b. TTE 05/2017: EF 55-60%, mild HK basal-midinferior wall, Gr1DD, mod to sev AS w/ mean gradient 21 mmHg, valve area 0.99, mild MR, mildly dilated LA    CAD (coronary artery disease) 2018   a. inferior STEMI 06/02/2017: LHC 06/02/17: LM 20, D1 20%, o-pLCx 50, p-mRCA 100% s/p PCI/DES, mRCA 50, dRCA 30   GI bleed    a. noted 06/05/2017   HLD (hyperlipidemia)    Obesity    Social History   Socioeconomic History   Marital status: Married    Spouse name: Not on file   Number of children: Not on file   Years of education: Not on file   Highest education level: Not on file  Occupational History   Occupation: retired  Tobacco Use   Smoking status: Former    Packs/day: 0.50    Years: 50.00    Pack years: 25.00    Types: Cigarettes    Quit date: 06/01/2017    Years since quitting: 3.6   Smokeless tobacco: Never  Vaping Use   Vaping Use: Never used  Substance and Sexual Activity   Alcohol use: No   Drug use: No   Sexual activity: Never    Partners: Female  Other Topics Concern   Not on file  Social History Narrative   Not on file   Social Determinants of Health   Financial Resource Strain: Not on file  Food  Insecurity: Not on file  Transportation Needs: Not on file  Physical Activity: Not on file  Stress: Not on file  Social Connections: Not on file   Family History  Problem Relation Age of Onset   Valvular heart disease Mother    Diabetes Mellitus II Daughter    Scheduled Meds:  Chlorhexidine Gluconate Cloth  6 each Topical Daily   docusate sodium  100 mg Oral BID   enoxaparin (LOVENOX) injection  40 mg Subcutaneous Q24H   feeding supplement  237 mL Oral BID BM  ipratropium-albuterol  3 mL Nebulization Q6H   methylPREDNISolone (SOLU-MEDROL) injection  60 mg Intravenous Q12H   midodrine  2.5 mg Oral TID WC   multivitamin with minerals  1 tablet Oral Daily   rifampin  300 mg Oral BID   rosuvastatin  10 mg Oral Daily   Continuous Infusions:  methocarbamol (ROBAXIN) IV     promethazine (PHENERGAN) injection (IM or IVPB)     vancomycin 1,250 mg (01/22/21 2249)   PRN Meds:.bisacodyl, guaiFENesin-dextromethorphan, HYDROmorphone (DILAUDID) injection, ipratropium-albuterol, lip balm, methocarbamol **OR** methocarbamol (ROBAXIN) IV, metoCLOPramide **OR** metoCLOPramide (REGLAN) injection, nitroGLYCERIN, ondansetron **OR** ondansetron (ZOFRAN) IV, oxyCODONE, oxyCODONE, promethazine **OR** promethazine (PHENERGAN) injection (IM or IVPB) **OR** promethazine, senna-docusate, sodium phosphate, traMADol, traZODone Medications Prior to Admission:  Prior to Admission medications   Medication Sig Start Date End Date Taking? Authorizing Provider  aspirin 81 MG EC tablet Take 81 mg by mouth daily.   Yes [provider]  carvedilol (COREG) 3.125 MG tablet Take 3.125 mg by mouth 2 (two) times daily with a meal.   Yes [provider]  oxyCODONE (OXY IR/ROXICODONE) 5 MG immediate release tablet Take 5 mg by mouth every 4 (four) hours as needed for severe pain.   Yes [provider]  rosuvastatin (CRESTOR) 10 MG tablet Take 1 tablet by mouth once daily Patient taking differently:  Take 10 mg by mouth daily. 07/24/20  Yes Iran Ouch, MD  oxyCODONE (OXY IR/ROXICODONE) 5 MG immediate release tablet Take 1-2 tablets (5-10 mg total) by mouth every 4 (four) hours as needed for moderate pain (pain score 4-6). 01/19/21   Anson [place], PA-C   No Known Allergies Review of Systems  Constitutional:  Positive for activity change and fatigue.  Respiratory:  Positive for shortness of breath.    Physical Exam Pulmonary:     Comments: A bit breathless with talking.  Neurological:     Mental Status: She is alert.    Vital Signs: BP 119/75 (BP Location: Right Arm)   Pulse 92   Temp 99.9 F (37.7 C) (Oral)   Resp 20   Ht 5\' 5"  (1.651 m)   Wt 106.6 kg   SpO2 91%   BMI 39.11 kg/m  Pain Scale: 0-10   Pain Score: 5    SpO2: SpO2: 91 % O2 Device:SpO2: 91 % O2 Flow Rate: .O2 Flow Rate (L/min): 10 L/min  IO: Intake/output summary:  Intake/Output Summary (Last 24 hours) at 01/23/2021 1541 Last data filed at 01/23/2021 0510 Gross per 24 hour  Intake --  Output 950 ml  Net -950 ml    LBM: Last BM Date: 01/23/21 Baseline Weight: Weight: 106.6 kg Most recent weight: Weight: 106.6 kg        Time In: 2:45 Time Out: 3:55 Time Total: 70 min Greater than 50%  of this time was spent counseling and coordinating care related to the above assessment and plan.  Signed by: 01/25/21, NP   Please contact Palliative Medicine Team phone at [phone] for questions and concerns.  For individual provider: See [phone]

## 2021-01-23 NOTE — Progress Notes (Signed)
[facility] PROGRESS NOTE    Monica [name]  RSW:[id number] DOB: 05-15-1934 DOA: 01/15/2021 PCP: Patient, No Pcp Per (Inactive)      Brief Narrative:  Monica [name] is a 87 y.o. F with CAD s/p PCI x1 in 2018, severe AS being worked up for TAVR, Obesity, pHTN, and COPD who presented with hip pain.  Had hip fracture after fall in Feb, repaired and discharged home.  Since being home 4 months ago, really never able to walk due to pain. Finally, in the last week before admission, developed redness and worse pain at left hip surgical site, then wound dehisced.  Presented to the ER and Orthopedics were consulted for suspected post-surgical hip infection.    Also noted incidentally to have very large AAA.         Assessment & Plan:  Postsurgical hip infection S/p debridement of the left hip wound by Dr. Allena Katz, 6/7  Admitted and taken to OR 6/7 for washout.  Intraoperative cultures growing MRSA in 3/3. ID consulted, recommended treating with 4 weeks antibiotics for presumed MRSA prosthetic infection.    Surgical site good, WBC normal, no fever.  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  Patient was evaluated by vascular surgery, who recommended urgent repair given the size.  She is not a candidate for open repair.    Pending repair 6/15 Weds endovascularly   - Consult vascular surgery, appreciate cares   Severe aortic stenosis Previously known, since hip fracture in Feb.  Was pending TAVR eval prior to this admission, seen by Cardiology here.  -Plan for expedited TAVR work up after AAA repair   Pulmonary hypertension due to AS, CHF  Hypotension due to aortic stenosis -Continue midodrine  Chronic diastolic CHF Got more Lasix today and afterwards able to lay flat -Defer diuretic to Cardiology     Acute on chronic hypoxic respiratory  failure   Has chronic hypoxic respiratory failure due to aortic stenosis, heart failure, and pulmonary hypertension.  COPD is in chart but appears to be mild. Former smoker.   During first 5 days in hospital, O2 needs 2-3L (baseline is 2L since hip fracture).    Since 6/12, O2 requirements up steeply to 10L/min.    Chest x-ray unremarkable, CT angiogram of the chest with patchy subpleural fibrosis, no PE, moderate atelectasis, no edema or large consolidation, no effusion. - Consult to pulmonogy - Start Solumedrol BID for NSIP          Coronary artery disease, secondary prevention - Continue Crestor  Iron deficiency anemia Hgb stable Given Feraheme 6/10 -Transfusion threshold 8 g/dL    Inactive or resolved issues: Hyponatremia Mild, asymptomatic  Positive blood culture 1/2 admission cultures positive for staph epidermidis.  We suspect this is a contaminant.  In carefully considering the relative risk of graft infection versus urgency of 8 cm aneurysm repair, I agree with vascular surgery to continue IV antibiotics for 7 days and proceed with repair next Wednesday  Obesity BMI 39  Vaginal candidiasis Given Fluconazole 1 dose on 6/10, and foley placed for 48 hours, resolved, foley removed.  Left renal nodule Incidental finding, minor, given gravity of AAA and AS and hypoxia, this likely has not been discussed with patient and should be followed up only depending on goals of care after AAA repair - Follow-up MRI as an outpatient  Disposition: Status is: Inpatient  Remains inpatient appropriate because: She requires ongoing IV antibiotics, and upcoming urgent endovascular AAA repair on Wednesday.   Dispo: The patient is from: Home              Anticipated d/c is to: SNF              Patient currently is not medically stable to d/c.   Difficult to place patient No       Level of care: Med-Surg       MDM: The below labs and imaging reports  were reviewed and summarized above.  Medication management as above.    DVT prophylaxis: enoxaparin (LOVENOX) injection 40 mg Start: 01/17/21 0800  Code Status: FULL Family Communication: Son by phone    Culture: 6/6 blood culture x2: 1/2 with staph epi -- likely contaminant 6/7 intraoperative cultures x3: MRSA in 3/3 6/8 blood culture x2: ngtd      Subjective: Still with nausea, still tired, no chest pain, no abdominal pain, no vomiting.    Objective: Vitals:   01/23/21 0237 01/23/21 0456 01/23/21 0740 01/23/21 0745  BP:  134/76  (!) 143/62  Pulse:  95  98  Resp:  20  14  Temp:  98 F (36.7 C)  98.3 F (36.8 C)  TempSrc:  Oral  Oral  SpO2: 92% 96% 93% 96%  Weight:      Height:        Intake/Output Summary (Last 24 hours) at 01/23/2021 1017 Last data filed at 01/23/2021 0510 Gross per 24 hour  Intake --  Output 950 ml  Net -950 ml   Filed Weights   01/15/21 1638  Weight: 106.6 kg    Examination: General appearance: Obese elderly female, lying in bed, debilitated     HEENT: Anicteric, gingiva pink, lids and lashes normal.  Partially edentulous, lips normal, oropharynx dry, no oral lesions, hearing normal Skin: Left hip has a little blister from Tegaderm, but otherwise looks like it is healing well, no new tenderness or drainage.  Groin rash resolved.   Cardiac: RRR, systolic murmur, no lower extremity edema Respiratory: Normal respiratory rate, lung sounds diminished, some rhonchi bilaterally, with wheezing.   Abdomen: Abdomen soft without tenderness palpation or guarding, no ascites or distention MSK:  Neuro: Awake and alert, extraocular movements intact, moves upper extremities with severe generalized weakness, but symmetric, face symmetric and speech fluent Psych: Sensorium intact, responding to questions appropriately, attention normal, affect flat, judgment insight appear normal         Data Reviewed: I have personally reviewed following labs and  imaging studies:  CBC: Recent Labs  Lab 01/19/21 0416 01/20/21 0132 01/21/21 0451 01/22/21 0452 01/23/21 0426  WBC 7.9 9.5 5.8 6.6 5.7  HGB 10.8* 13.4 11.7* 12.0 12.9  HCT 35.3* 45.1 37.4 38.7 40.2  MCV 77.8* 79.7* 75.7* 76.0* 74.2*  PLT 279 226 298 296 287   Basic Metabolic Panel: Recent Labs  Lab 01/19/21 0416 01/20/21 0132 01/21/21 0451 01/22/21 0452 01/23/21 0426  NA 134* 134* 134* 135 134*  K 4.2 4.4 3.3* 3.9 3.5  CL 99 97* 94* 93* 89*  CO2 28 28 32 35* 33*  GLUCOSE 84 104* 95 103* 113*  BUN CREATININE 0.97 0.82 0.73 0.74 0.77  CALCIUM 8.7* 9.1 8.7* 8.6* 8.8*   GFR: Estimated Creatinine Clearance: 62.3 mL/min (by C-G formula based on SCr of 0.77 mg/dL). Liver Function Tests: Recent Labs  Lab 01/23/21  0426  AST 26  ALT 11  ALKPHOS 60  BILITOT 1.9*  PROT 7.6  ALBUMIN 3.0*   No results for input(s): LIPASE, AMYLASE in the last 168 hours. No results for input(s): AMMONIA in the last 168 hours. Coagulation Profile: No results for input(s): INR, PROTIME in the last 168 hours. Cardiac Enzymes: No results for input(s): CKTOTAL, CKMB, CKMBINDEX, TROPONINI in the last 168 hours. BNP (last 3 results) No results for input(s): PROBNP in the last 8760 hours. HbA1C: No results for input(s): HGBA1C in the last 72 hours. CBG: No results for input(s): GLUCAP in the last 168 hours. Lipid Profile: No results for input(s): CHOL, HDL, LDLCALC, TRIG, CHOLHDL, LDLDIRECT in the last 72 hours. Thyroid Function Tests: No results for input(s): TSH, T4TOTAL, FREET4, T3FREE, THYROIDAB in the last 72 hours. Anemia Panel: No results for input(s): VITAMINB12, FOLATE, FERRITIN, TIBC, IRON, RETICCTPCT in the last 72 hours.  Urine analysis:    Component Value Date/Time   COLORURINE YELLOW (A) 04/29/2018 1357   APPEARANCEUR CLEAR (A) 04/29/2018 1357   LABSPEC 1.010 04/29/2018 1357   PHURINE 7.0 04/29/2018 1357   GLUCOSEU NEGATIVE 04/29/2018 1357   HGBUR SMALL  (A) 04/29/2018 1357   BILIRUBINUR NEGATIVE 04/29/2018 1357   KETONESUR 5 (A) 04/29/2018 1357   PROTEINUR NEGATIVE 04/29/2018 1357   NITRITE NEGATIVE 04/29/2018 1357   LEUKOCYTESUR NEGATIVE 04/29/2018 1357   Sepsis Labs: (procalcitonin:4,lacticacidven:4)  ) Recent Results (from the past 240 hour(s))  Blood culture (routine x 2)     Status: Abnormal   Collection Time: 01/15/21  4:43 PM   Specimen: BLOOD LEFT HAND  Result Value Ref Range Status   Specimen Description   Final    BLOOD LEFT HAND Performed at [facility], [address] [place], [place] [address]    Special Requests   Final    BOTTLES DRAWN AEROBIC AND ANAEROBIC Blood Culture adequate volume Performed at [facility], [address] [place], [place] [address]    Culture  Setup Time   Final    Organism ID to follow GRAM POSITIVE COCCI AEROBIC BOTTLE ONLY CRITICAL RESULT CALLED TO, READ BACK BY AND VERIFIED WITH: AMY THOMPSON AT 0981 ON 01/16/2021 [facility]. Performed at [facility], [address] [place], [place] [address]    Culture (A)  Final    STAPHYLOCOCCUS EPIDERMIDIS THE SIGNIFICANCE OF ISOLATING THIS ORGANISM FROM A SINGLE SET OF BLOOD CULTURES WHEN MULTIPLE SETS ARE DRAWN IS UNCERTAIN. PLEASE NOTIFY THE MICROBIOLOGY DEPARTMENT WITHIN ONE WEEK IF SPECIATION AND SENSITIVITIES ARE REQUIRED. Performed at [facility], [address], [place], [place] [address]    Report Status 01/18/2021 FINAL  Final  Blood culture (routine x 2)     Status: None   Collection Time: 01/15/21  4:43 PM   Specimen: BLOOD  Result Value Ref Range Status   Specimen Description BLOOD BLOOD RIGHT WRIST  Final   Special Requests   Final    BOTTLES DRAWN AEROBIC AND ANAEROBIC Blood Culture results may not be optimal due to an inadequate volume of blood received in culture bottles   Culture   Final    NO GROWTH 5 DAYS Performed at [facility], [address], [place], [place]  [address]    Report Status 01/20/2021 FINAL  Final  Blood Culture ID Panel (Reflexed)     Status: Abnormal   Collection Time: 01/15/21  4:43 PM  Result Value Ref Range Status   Enterococcus faecalis NOT DETECTED  NOT DETECTED Final   Enterococcus Faecium NOT DETECTED NOT DETECTED Final   Listeria monocytogenes NOT DETECTED NOT DETECTED Final   Staphylococcus species DETECTED (A) NOT DETECTED Final    Comment: CRITICAL RESULT CALLED TO, READ BACK BY AND VERIFIED WITH: AMY THOMPSON AT 6659 ON 01/16/2021 [facility].    Staphylococcus aureus (BCID) NOT DETECTED NOT DETECTED Final   Staphylococcus epidermidis DETECTED (A) NOT DETECTED Final    Comment: Methicillin (oxacillin) resistant coagulase negative staphylococcus. Possible blood culture contaminant (unless isolated from more than one blood culture draw or clinical case suggests pathogenicity). No antibiotic treatment is indicated for blood  culture contaminants. CRITICAL RESULT CALLED TO, READ BACK BY AND VERIFIED WITH: AMY THOMPSON AT 9357 ON 01/16/2021 [facility].    Staphylococcus lugdunensis NOT DETECTED NOT DETECTED Final   Streptococcus species NOT DETECTED NOT DETECTED Final   Streptococcus agalactiae NOT DETECTED NOT DETECTED Final   Streptococcus pneumoniae NOT DETECTED NOT DETECTED Final   Streptococcus pyogenes NOT DETECTED NOT DETECTED Final   A.calcoaceticus-baumannii NOT DETECTED NOT DETECTED Final   Bacteroides fragilis NOT DETECTED NOT DETECTED Final   Enterobacterales NOT DETECTED NOT DETECTED Final   Enterobacter cloacae complex NOT DETECTED NOT DETECTED Final   Escherichia coli NOT DETECTED NOT DETECTED Final   Klebsiella aerogenes NOT DETECTED NOT DETECTED Final   Klebsiella oxytoca NOT DETECTED NOT DETECTED Final   Klebsiella pneumoniae NOT DETECTED NOT DETECTED Final   Proteus species NOT DETECTED NOT DETECTED Final   Salmonella species NOT DETECTED NOT DETECTED Final   Serratia marcescens NOT DETECTED NOT DETECTED Final    Haemophilus influenzae NOT DETECTED NOT DETECTED Final   Neisseria meningitidis NOT DETECTED NOT DETECTED Final   Pseudomonas aeruginosa NOT DETECTED NOT DETECTED Final   Stenotrophomonas maltophilia NOT DETECTED NOT DETECTED Final   Candida albicans NOT DETECTED NOT DETECTED Final   Candida auris NOT DETECTED NOT DETECTED Final   Candida glabrata NOT DETECTED NOT DETECTED Final   Candida krusei NOT DETECTED NOT DETECTED Final   Candida parapsilosis NOT DETECTED NOT DETECTED Final   Candida tropicalis NOT DETECTED NOT DETECTED Final   Cryptococcus neoformans/gattii NOT DETECTED NOT DETECTED Final   Methicillin resistance mecA/C DETECTED (A) NOT DETECTED Final    Comment: CRITICAL RESULT CALLED TO, READ BACK BY AND VERIFIED WITH: AMY THOMPSON AT 0177 ON 01/16/2021 [facility]. Performed at [facility], [address] [place], [place] [address]   Resp Panel by RT-PCR (Flu A&B, Covid) Nasopharyngeal Swab     Status: None   Collection Time: 01/15/21  8:24 PM   Specimen: Nasopharyngeal Swab; Nasopharyngeal(NP) swabs in vial transport medium  Result Value Ref Range Status   SARS Coronavirus 2 by RT PCR NEGATIVE NEGATIVE Final    Comment: (NOTE) SARS-CoV-2 target nucleic acids are NOT DETECTED.  The SARS-CoV-2 RNA is generally detectable in upper respiratory specimens during the acute phase of infection. The lowest concentration of SARS-CoV-2 viral copies this assay can detect is 138 copies/mL. A negative result does not preclude SARS-Cov-2 infection and should not be used as the sole basis for treatment or other patient management decisions. A negative result may occur with  improper specimen collection/handling, submission of specimen other than nasopharyngeal swab, presence of viral mutation(s) within the areas targeted by this assay, and inadequate number of viral copies(<138 copies/mL). A negative result must be combined with clinical observations, patient history, and  epidemiological information. The expected result is Negative.  Fact Sheet for Patients:  BloggerCourse.com  Fact Sheet for Healthcare  Providers:  SeriousBroker.it  This test is no t yet approved or cleared by the [place] and  has been authorized for detection and/or diagnosis of SARS-CoV-2 by FDA under an Emergency Use Authorization (EUA). This EUA will remain  in effect (meaning this test can be used) for the duration of the COVID-19 declaration under Section 564(b)(1) of the Act, 21 U.S.C.section 360bbb-3(b)(1), unless the authorization is terminated  or revoked sooner.       Influenza A by PCR NEGATIVE NEGATIVE Final   Influenza B by PCR NEGATIVE NEGATIVE Final    Comment: (NOTE) The Xpert Xpress SARS-CoV-2/FLU/RSV plus assay is intended as an aid in the diagnosis of influenza from Nasopharyngeal swab specimens and should not be used as a sole basis for treatment. Nasal washings and aspirates are unacceptable for Xpert Xpress SARS-CoV-2/FLU/RSV testing.  Fact Sheet for Patients: BloggerCourse.com  Fact Sheet for Healthcare Providers: SeriousBroker.it  This test is not yet approved or cleared by the [place] FDA and has been authorized for detection and/or diagnosis of SARS-CoV-2 by FDA under an Emergency Use Authorization (EUA). This EUA will remain in effect (meaning this test can be used) for the duration of the COVID-19 declaration under Section 564(b)(1) of the Act, 21 U.S.C. section 360bbb-3(b)(1), unless the authorization is terminated or revoked.  Performed at [facility], [address] [place], [place] [address]   Aerobic/Anaerobic Culture w Gram Stain (surgical/deep wound)     Status: None   Collection Time: 01/16/21  2:25 PM   Specimen: Wound; Abscess  Result Value Ref Range Status   Specimen Description   Final     WOUND Performed at [facility], [address] [place], [place] [address]    Special Requests   Final    LEFT HIP Performed at [facility], [address] [place], [place] [address]    Gram Stain   Final    RARE WBC PRESENT,BOTH PMN AND MONONUCLEAR NO ORGANISMS SEEN    Culture   Final    RARE METHICILLIN RESISTANT STAPHYLOCOCCUS AUREUS NO ANAEROBES ISOLATED Performed at [facility], [address], [place], [place] [address]    Report Status 01/21/2021 FINAL  Final   Organism ID, Bacteria METHICILLIN RESISTANT STAPHYLOCOCCUS AUREUS  Final      Susceptibility   Methicillin resistant staphylococcus aureus - MIC*    CIPROFLOXACIN 4 RESISTANT Resistant     ERYTHROMYCIN >=8 RESISTANT Resistant     GENTAMICIN <=0.5 SENSITIVE Sensitive     OXACILLIN >=4 RESISTANT Resistant     TETRACYCLINE <=1 SENSITIVE Sensitive     VANCOMYCIN <=0.5 SENSITIVE Sensitive     TRIMETH/SULFA <=10 SENSITIVE Sensitive     CLINDAMYCIN <=0.25 SENSITIVE Sensitive     RIFAMPIN <=0.5 SENSITIVE Sensitive     Inducible Clindamycin NEGATIVE Sensitive     * RARE METHICILLIN RESISTANT STAPHYLOCOCCUS AUREUS  Aerobic/Anaerobic Culture w Gram Stain (surgical/deep wound)     Status: None   Collection Time: 01/16/21  2:25 PM   Specimen: Wound; Abscess  Result Value Ref Range Status   Specimen Description   Final    WOUND Performed at [place] [facility], [address] [place], [place] [address]    Special Requests LEFT HIP  Final   Gram Stain   Final    FEW WBC PRESENT, PREDOMINANTLY PMN NO ORGANISMS SEEN    Culture   Final    RARE METHICILLIN RESISTANT STAPHYLOCOCCUS AUREUS NO ANAEROBES ISOLATED Performed at [facility], [address] [place], [place] [address]    Report Status 01/21/2021 FINAL  Final   Organism ID, Bacteria METHICILLIN RESISTANT STAPHYLOCOCCUS AUREUS  Final      Susceptibility   Methicillin resistant staphylococcus aureus - MIC*    CIPROFLOXACIN  4 RESISTANT Resistant     ERYTHROMYCIN >=8 RESISTANT Resistant     GENTAMICIN <=0.5 SENSITIVE Sensitive     OXACILLIN >=4 RESISTANT Resistant     TETRACYCLINE <=1 SENSITIVE Sensitive     VANCOMYCIN <=0.5 SENSITIVE Sensitive     TRIMETH/SULFA <=10 SENSITIVE Sensitive     CLINDAMYCIN <=0.25 SENSITIVE Sensitive     RIFAMPIN <=0.5 SENSITIVE Sensitive     Inducible Clindamycin NEGATIVE Sensitive     * RARE METHICILLIN RESISTANT STAPHYLOCOCCUS AUREUS  Aerobic/Anaerobic Culture w Gram Stain (surgical/deep wound)     Status: None   Collection Time: 01/16/21  2:26 PM   Specimen: Wound; Abscess  Result Value Ref Range Status   Specimen Description   Final    WOUND Performed at [facility], [address] [place], [place] [address]    Special Requests   Final    LEFT HIP Performed at [facility], [address] [place], [place] [address]    Gram Stain NO WBC SEEN NO ORGANISMS SEEN   Final   Culture   Final    RARE METHICILLIN RESISTANT STAPHYLOCOCCUS AUREUS NO ANAEROBES ISOLATED Performed at [facility], [address] [place], [place] [address]    Report Status 01/21/2021 FINAL  Final   Organism ID, Bacteria METHICILLIN RESISTANT STAPHYLOCOCCUS AUREUS  Final      Susceptibility   Methicillin resistant staphylococcus aureus - MIC*    CIPROFLOXACIN 4 RESISTANT Resistant     ERYTHROMYCIN >=8 RESISTANT Resistant     GENTAMICIN <=0.5 SENSITIVE Sensitive     OXACILLIN >=4 RESISTANT Resistant     TETRACYCLINE <=1 SENSITIVE Sensitive     VANCOMYCIN 1 SENSITIVE Sensitive     TRIMETH/SULFA <=10 SENSITIVE Sensitive     CLINDAMYCIN <=0.25 SENSITIVE Sensitive     RIFAMPIN <=0.5 SENSITIVE Sensitive     Inducible Clindamycin NEGATIVE Sensitive     * RARE METHICILLIN RESISTANT STAPHYLOCOCCUS AUREUS  CULTURE, BLOOD (ROUTINE X 2) w Reflex to ID Panel     Status: None   Collection Time: 01/17/21 12:10 PM   Specimen: BLOOD  Result Value Ref Range Status   Specimen  Description BLOOD BLOOD RIGHT HAND  Final   Special Requests   Final    BOTTLES DRAWN AEROBIC AND ANAEROBIC Blood Culture results may not be optimal due to an excessive volume of blood received in culture bottles   Culture   Final    NO GROWTH 5 DAYS Performed at [facility], [address] [place], [place] [address]    Report Status 01/22/2021 FINAL  Final  CULTURE, BLOOD (ROUTINE X 2) w Reflex to ID Panel     Status: None   Collection Time: 01/17/21 12:13 PM   Specimen: BLOOD LEFT HAND  Result Value Ref Range Status   Specimen Description BLOOD LEFT HAND  Final   Special Requests   Final    BOTTLES DRAWN AEROBIC AND ANAEROBIC Blood Culture adequate volume   Culture   Final    NO GROWTH 5 DAYS Performed at [facility], [address] [place], [place] [address]    Report Status 01/22/2021 FINAL  Final         Radiology  Studies: CT ANGIO ABDOMEN W &/OR WO CONTRAST  Result Date: 01/21/2021 CLINICAL DATA:  Abdominal pain.  Known abdominal aortic aneurysm. EXAM: CT ANGIOGRAPHY ABDOMEN TECHNIQUE: Multidetector CT imaging of the abdomen was performed using the standard protocol during bolus administration of intravenous contrast. Multiplanar reconstructed images and MIPs were obtained and reviewed to evaluate the vascular anatomy. CONTRAST:  OMNIPAQUE IOHEXOL 350 MG/ML SOLN COMPARISON:  01/17/2021 FINDINGS: VASCULAR Aorta: Stable advanced atherosclerotic calcifications involving the aorta. There is a stable 7.8 x 7.6 cm infrarenal abdominal aortic aneurysm. This and is right at the iliac artery bifurcation. No dissection. No evidence for acute rupture. Large amount of mural thrombus is stable. Celiac: Ostial calcifications and mild ostial stenosis. SMA: Ostial calcifications and mild ostial stenosis. Renals: Ostial calcifications and mild ostial stenosis. IMA: Not identified. Inflow: Stable right common iliac artery aneurysm with maximum diameter of 2.7 cm. Veins:  Grossly normal. Review of the MIP images confirms the above findings. NON-VASCULAR Lower chest: Stable significant chronic lung disease and mediastinal adenopathy. No pleural effusions. Stable thoracic aortic and coronary artery calcifications. Hepatobiliary: No hepatic lesions or intrahepatic biliary dilatation. Stable cholelithiasis. No findings for acute cholecystitis. Normal caliber common bile duct. Pancreas: No mass, inflammation or ductal dilatation. Spleen: Normal size.  Small calcified granulomas. Adrenals/Urinary Tract: The adrenal glands and kidneys are unremarkable and stable. Stable renal cysts and renal cortical scarring changes. Stable 13 mm lesion projecting off the lower pole region of left kidney could be a complex cyst or small renal neoplasm. Stomach/Bowel: The stomach, visualized small bowel and visualized colon are unremarkable. Lymphatic: No mesenteric or retroperitoneal mass or adenopathy. Other: No ascites or abdominal wall hernia. Musculoskeletal: No significant findings. IMPRESSION: 1. Stable 7.8 x 7.6 cm infrarenal abdominal aortic aneurysm. No evidence for acute rupture. 2. Stable right common iliac artery aneurysm with maximum diameter of 2.7 cm. 3. Stable advanced atherosclerotic calcifications involving the aorta and branch vessels. 4. Stable significant chronic lung disease and mediastinal adenopathy. 5. Stable cholelithiasis. 6. Stable 13 mm lower pole left renal lesion. As before recommend follow-up MRI abdomen for further evaluation. Aortic Atherosclerosis (ICD10-I70.0). Electronically Signed   By: Rudie Meyer M.D.   On: 01/21/2021 15:36         Scheduled Meds:  Chlorhexidine Gluconate Cloth  6 each Topical Daily   docusate sodium  100 mg Oral BID   enoxaparin (LOVENOX) injection  40 mg Subcutaneous Q24H   feeding supplement  237 mL Oral BID BM   furosemide  40 mg Intravenous Once   ipratropium-albuterol  3 mL Nebulization Q6H   methylPREDNISolone (SOLU-MEDROL)  injection  60 mg Intravenous Q12H   midodrine  2.5 mg Oral TID WC   multivitamin with minerals  1 tablet Oral Daily   potassium chloride  40 mEq Oral Once   rifampin  300 mg Oral BID   rosuvastatin  10 mg Oral Daily   Continuous Infusions:  methocarbamol (ROBAXIN) IV     promethazine (PHENERGAN) injection (IM or IVPB)     vancomycin 1,250 mg (01/22/21 2249)     LOS: 8 days    Time spent: 35 minutes    Alberteen Sam, MD [facility] 01/23/2021, 10:17 AM     Please page though AMION or Epic secure chat:  For [employer], Higher education careers adviser

## 2021-01-23 NOTE — Plan of Care (Signed)

## 2021-01-23 NOTE — Progress Notes (Signed)
Progress Note  Patient Name: Monica ArmourSylvia Grills Date of Encounter: 01/23/2021  Primary Cardiologist: Kirke CorinArida  Subjective   No chest pain. Feels like her dyspnea is a little improved when compared to yesterday. She remains on supplemental oxygen via nasal cannula at 11 L. No recent BM.  Inpatient Medications    Scheduled Meds:  Chlorhexidine Gluconate Cloth  6 each Topical Daily   docusate sodium  100 mg Oral BID   enoxaparin (LOVENOX) injection  40 mg Subcutaneous Q24H   feeding supplement  237 mL Oral BID BM   ipratropium-albuterol  3 mL Nebulization Q6H   methylPREDNISolone (SOLU-MEDROL) injection  60 mg Intravenous Q12H   midodrine  2.5 mg Oral TID WC   multivitamin with minerals  1 tablet Oral Daily   rifampin  300 mg Oral BID   rosuvastatin  10 mg Oral Daily   Continuous Infusions:  methocarbamol (ROBAXIN) IV     promethazine (PHENERGAN) injection (IM or IVPB)     vancomycin 1,250 mg (01/22/21 2249)   PRN Meds: bisacodyl, guaiFENesin-dextromethorphan, HYDROmorphone (DILAUDID) injection, ipratropium-albuterol, lip balm, methocarbamol **OR** methocarbamol (ROBAXIN) IV, metoCLOPramide **OR** metoCLOPramide (REGLAN) injection, nitroGLYCERIN, ondansetron **OR** ondansetron (ZOFRAN) IV, oxyCODONE, oxyCODONE, promethazine **OR** promethazine (PHENERGAN) injection (IM or IVPB) **OR** promethazine, senna-docusate, sodium phosphate, traMADol, traZODone   Vital Signs    Vitals:   01/23/21 0237 01/23/21 0456 01/23/21 0740 01/23/21 0745  BP:  134/76  (!) 143/62  Pulse:  95  98  Resp:  20  14  Temp:  98 F (36.7 C)  98.3 F (36.8 C)  TempSrc:  Oral  Oral  SpO2: 92% 96% 93% 96%  Weight:      Height:        Intake/Output Summary (Last 24 hours) at 01/23/2021 0858 Last data filed at 01/23/2021 0510 Gross per 24 hour  Intake --  Output 950 ml  Net -950 ml    Filed Weights   01/15/21 1638  Weight: 106.6 kg    Telemetry    Not on telemetry - Personally Reviewed  ECG     No new tracings available for review - Personally Reviewed  Physical Exam   GEN: No acute distress. Weak appearing.  Neck: No JVD. Cardiac: RRR, III/VI systolic murmur heard throughout, no rubs, or gallops.  Respiratory: Diminished and coarse breath sounds bilaterally to anterior exam given patient weakness.  GI: Soft, nontender, non-distended.   MS: No edema; No deformity. Neuro:  Alert and oriented x 3; Nonfocal.  Psych: Normal affect.  Labs    Chemistry Recent Labs  Lab 01/21/21 0451 01/22/21 0452 01/23/21 0426  NA 134* 135 134*  K 3.3* 3.9 3.5  CL 94* 93* 89*  CO2 32 35* 33*  GLUCOSE 95 103* 113*  BUN 19 19 22   CREATININE 0.73 0.74 0.77  CALCIUM 8.7* 8.6* 8.8*  PROT  --   --  7.6  ALBUMIN  --   --  3.0*  AST  --   --  26  ALT  --   --  11  ALKPHOS  --   --  60  BILITOT  --   --  1.9*  GFRNONAA >60 >60 >60  ANIONGAP 8 7 12       Hematology Recent Labs  Lab 01/21/21 0451 01/22/21 0452 01/23/21 0426  WBC 5.8 6.6 5.7  RBC 4.94 5.09 5.42*  HGB 11.7* 12.0 12.9  HCT 37.4 38.7 40.2  MCV 75.7* 76.0* 74.2*  MCH 23.7* 23.6* 23.8*  MCHC 31.3  31.0 32.1  RDW 18.4* 18.3* 19.6*  PLT 298 296 287     Cardiac EnzymesNo results for input(s): TROPONINI in the last 168 hours. No results for input(s): TROPIPOC in the last 168 hours.   BNP Recent Labs  Lab 01/21/21 0451 01/22/21 1640  BNP 223.1* 146.7*      DDimer  Recent Labs  Lab 01/20/21 1336  DDIMER 3.53*      Radiology    DG Chest 2 View  Result Date: 01/21/2021 IMPRESSION: Coarse bilateral reticular opacities consistent with underlying pulmonary fibrosis. Electronically Signed   By: Meda Klinefelter MD   On: 01/21/2021 10:33   CT Angio Chest Pulmonary Embolism (PE) W or WO Contrast  Result Date: 01/20/2021 IMPRESSION: No evidence of pulmonary embolus. Mild cardiomegaly.  Evidence of pulmonary arterial hypertension. Peripheral interstitial thickening and ground-glass opacities likely reflects  fibrosis. Dependent and bibasilar atelectasis or scarring. Stable mediastinal and bilateral hilar adenopathy. Coronary artery disease. Cholelithiasis. Aortic Atherosclerosis (ICD10-I70.0). Electronically Signed   By: Charlett Nose M.D.   On: 01/20/2021 19:04   CT ANGIO ABDOMEN W &/OR WO CONTRAST  Result Date: 01/21/2021 IMPRESSION: 1. Stable 7.8 x 7.6 cm infrarenal abdominal aortic aneurysm. No evidence for acute rupture. 2. Stable right common iliac artery aneurysm with maximum diameter of 2.7 cm. 3. Stable advanced atherosclerotic calcifications involving the aorta and branch vessels. 4. Stable significant chronic lung disease and mediastinal adenopathy. 5. Stable cholelithiasis. 6. Stable 13 mm lower pole left renal lesion. As before recommend follow-up MRI abdomen for further evaluation. Aortic Atherosclerosis (ICD10-I70.0). Electronically Signed   By: Rudie Meyer M.D.   On: 01/21/2021 15:36    Cardiac Studies   2D echo 10/03/2020: 1. Left ventricular ejection fraction, by estimation, is 60 to 65%. The  left ventricle has normal function. Left ventricular endocardial border  not optimally defined to evaluate regional wall motion. There is mild left  ventricular hypertrophy. Left  ventricular diastolic parameters are consistent with Grade I diastolic  dysfunction (impaired relaxation). Elevated left atrial pressure.   2. Right ventricular systolic function is normal. The right ventricular  size is mildly enlarged. Tricuspid regurgitation signal is inadequate for  assessing PA pressure.   3. Left atrial size was mildly dilated.   4. Right atrial size was mildly dilated.   5. The mitral valve is degenerative. Trivial mitral valve regurgitation.  Mild mitral stenosis. Moderate mitral annular calcification.   6. Tricuspid valve regurgitation at least moderate but suboptimally  visualized.   7. The aortic valve is tricuspid. There is moderate calcification of the  aortic valve. There is severe  thickening of the aortic valve. Aortic valve  regurgitation is not visualized. Severe aortic valve stenosis. Aortic  valve area, by VTI measures 0.51  cm. Aortic valve mean gradient measures 60.0 mmHg. Aortic valve Vmax  measures 4.83 m/s.   8. The inferior vena cava is normal in size with greater than 50%  respiratory variability, suggesting right atrial pressure of 3 mmHg.   Comparison(s): Very severe aortic stenosis, worsened compared with prior  echocardiogram from 10/26/2019.  __________  2D echo 10/26/2019: 1. Left ventricular ejection fraction, by estimation, is 60 to 65%. The  left ventricle has normal function. The left ventricle has no regional  wall motion abnormalities. There is moderate left ventricular hypertrophy.  Indeterminate diastolic filling due   to E-A fusion.   2. Right ventricular systolic function is normal. The right ventricular  size is normal. Tricuspid regurgitation signal is inadequate for  assessing  PA pressure.   3. Left atrial size was mildly dilated.   4. The mitral valve is degenerative. No evidence of mitral valve  regurgitation. No evidence of mitral stenosis.   5. The aortic valve has an indeterminant number of cusps. Aortic valve  regurgitation is trivial. Moderate to severe aortic valve stenosis. Aortic  valve area, by VTI measures 0.88 cm. Aortic valve mean gradient measures  34.0 mmHg. Aortic valve Vmax  measures 3.92 m/s.   6. The inferior vena cava is normal in size with greater than 50%  respiratory variability, suggesting right atrial pressure of 3 mmHg. ___________  2D echo 04/30/2018: - Left ventricle: The cavity size was normal. There was mild    concentric hypertrophy. Systolic function was normal. The    estimated ejection fraction was in the range of 60% to 65%. Wall    motion was normal; there were no regional wall motion    abnormalities. Doppler parameters are consistent with abnormal    left ventricular relaxation (grade 1  diastolic dysfunction).  - Aortic valve: There was moderate to severe stenosis. Mean    gradient (S): 22 mm Hg. Valve area (VTI): 0.99 cm^2.  - Left atrium: The atrium was moderately dilated.  - Pulmonary arteries: Systolic pressure was mildly increased. PA    peak pressure: 35 mm Hg (S).  - Pericardium, extracardiac: A trivial pericardial effusion was    identified posterior to the heart. ___________  2D echo 12/23/2017: - Left ventricle: The cavity size was normal. There was mild    concentric hypertrophy. Systolic function was normal. The    estimated ejection fraction was in the range of 60% to 65%. Wall    motion was normal; there were no regional wall motion    abnormalities. Doppler parameters are consistent with abnormal    left ventricular relaxation (grade 1 diastolic dysfunction).  - Aortic valve: Transvalvular velocity was increased. There was    moderate to severe stenosis by estimated AVA, moderate stenosis    by mean gradient and peak velocity. . Peak velocity (S): 376    cm/s. Mean gradient (S): 31 mm Hg. Valve area (VTI): 0.97 cm^2.  - Mitral valve: Calcified annulus.  - Left atrium: The atrium was mildly dilated.  - Right ventricle: Systolic function was normal.  - Pulmonary arteries: Systolic pressure was mildly elevated. PA    peak pressure: 43 mm Hg (S).  ___________  2D echo 06/02/2017: - Left ventricle: The cavity size was normal. There was mild    concentric hypertrophy. Systolic function was normal. The    estimated ejection fraction was in the range of 55% to 60%. Mild    hypokinesis of the basal-midinferior myocardium. Doppler    parameters are consistent with abnormal left ventricular    relaxation (grade 1 diastolic dysfunction).  - Aortic valve: There was moderate to severe stenosis. Mean    gradient (S): 21 mm Hg. Valve area (VTI): 0.99 cm^2.  - Mitral valve: Calcified annulus. There was mild regurgitation.  - Left atrium: The atrium was mildly  dilated.  ___________  LHC 06/01/2017: Mid RCA lesion, 50 %stenosed. Dist RCA lesion, 30 %stenosed. LM lesion, 20 %stenosed. Ost 1st Diag to 1st Diag lesion, 20 %stenosed. Ost Cx to Prox Cx lesion, 50 %stenosed. A drug eluting stent was successfully placed. Prox RCA to Mid RCA lesion, 100 %stenosed. Post intervention, there is a 0% residual stenosis.   1. Severe one-vessel coronary artery disease with thrombotic occlusion of the  proximal and mid right coronary artery. Moderate ostial left circumflex stenosis and mild LAD disease. 2. At least moderate aortic stenosis with a peak to peak gradient of 22 mmHg. Normal left ventricular end-diastolic pressure. 3. Successful angioplasty and drug-eluting stent placement to the proximal right coronary artery extending into the midsegment. 4. Severe hypotension and bradycardia after opening the right coronary artery  likely due to neurohormonal reflects and required treatment with atropine and norepinephrine drip which was discontinued at the end of the case.   Recommendations: Dual antiplatelet therapy for at least one year. Aggressive treatment of risk factors and smoking cessation. Obtain an echocardiogram to evaluate aortic stenosis and LV systolic function. Avoid catheterization via the right radial artery in the future due to right radial loop.    Patient Profile     87 y.o. 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  Assessment & Plan    1. Preoperative cardiac risk stratification for AAA repair: -Not felt to be a candidate for open repair given significant comorbid risk factors -Patient is high risk for noncardiac surgery, even with  endovascular repair, no further testing or cardiac intervention at this time will decrease this risk -Endovascular repair of her AAA is scheduled for 6/15, though she continues to require supplemental oxygen at 11 L -Management per vascular surgery   2. Severe aortic stenosis: -Will need TAVR evaluation as an outpatient -She remains on midodrine  -Avoid empiric diuresis   3. Dyspnea: -Multifactorial including pulmonary fibrosis, severe aortic stenosis, and diastolic dysfunction -Torsemide held today in an effort to not over diurese, reassess each day -Pulmonology has evaluated her given escalating oxygen needs and pulmonary fibrosis with plans for steroids -If dyspnea persists, consider RHC -Avoid empiric diuresis with noted severe aortic stenosis   4. CAD involving the native coronary arteries s/p PCI in 2018 with normal high sensitivity troponin: -Would recommend resuming PTA aspirin 81 mg daily if there are no contraindications  -PTA Crestor   5. Post surgical hip infection: -Status post debridement in the OR on 6/7 with intraoperative cultures growing MRSA -Blood cultures no growth x 2   6. Iron deficiency anemia: -Status post Feraheme 6/10 -Stable   For questions or updates, please contact [facility] Please consult [email or web address] for contact info under Cardiology/STEMI.    Signed, Eula Listen, PA-C [facility] Pager: [phone] 01/23/2021, 8:58 AM

## 2021-01-23 NOTE — Progress Notes (Signed)
Physical Therapy Treatment Patient Details Name: Monica [name] MRN: [id number] DOB: 06-Sep-1933 Today's Date: 01/23/2021    History of Present Illness Pt is an 87 y.o. female presenting to hospital 6/6 for L hip drainage.  Pt admitted with L hip severe cellulitis and likely abscess, L LE moderate cellulitis, and infrarenal aortic artery aneurysm (6.7 cm x6.1 cm with extensive mural thrombus).  S/p L hip wound I&D and excisional debridement of muscle and fascia 6/7.  PMH includes CAD, HLD, obesity, L hip IMN February 2022, aortic stenosis, CAD, GI bleed, acute respiratory failure with hypoxia, STEMI.    PT Comments    Pt resting in bed upon PT arrival.  Able to logroll towards R side with mod to max assist (assist to maintain side lying position)--pt noted to be incontinent of bowel requiring clean-up (nurse present assisting pt with this).  Then pt logrolled towards L side with mod to max assist to finish changing sheets.  Pt's O2 sats initially (at rest beginning of session) 94% on 11 L O2 via high flow nasal cannula.  After above clean-up pt's O2 sats noted to be 85% on 11 L O2 via high flow nasal cannula.  Pt only able to briefly increase O2 sats to 91-92% with cueing for pursed lip breathing before pt's O2 sats starting to decrease again (pt appears to be a mouth breather and has difficulty maintaining pursed lip breathing technique)--O2 decreased to 78-85% when not purse lip breathing.  Nurse placed pt on non-rebreather briefly (O2 sats improved to 95%) and then nurse changed pt back to high flow nasal cannula when therapist was leaving pt's room (pt noted to be satting 92% end of session at rest on HFNC).  Pt currently scheduled for  aneurysm repair 6/15 (tomorrow); anticipate therapy will need new PT orders post-op.  Will monitor pt's status/POC.      Follow Up Recommendations  SNF     Equipment Recommendations  3in1 (PT);Wheelchair (measurements PT);Wheelchair cushion (measurements  PT);Hospital bed;Other (comment) (hoyer lift)    Recommendations for Other Services       Precautions / Restrictions Precautions Precautions: Fall Precaution Comments: Wound vac; monitor O2 sats Restrictions Weight Bearing Restrictions: Yes LLE Weight Bearing: Weight bearing as tolerated    Mobility  Bed Mobility Overal bed mobility: Needs Assistance Bed Mobility: Rolling Rolling: Mod assist;Max assist         General bed mobility comments: vc's for technique; assist for logrolling to R and then L in bed (extra assist for L LE d/t L hip and knee pain)    Transfers Overall transfer level:  (Deferred d/t O2 desaturation with logrolling in bed)                  Ambulation/Gait                 Stairs             Wheelchair Mobility    Modified Rankin (Stroke Patients Only)       Balance                                            Cognition Arousal/Alertness: Awake/alert Behavior During Therapy: WFL for tasks assessed/performed (pt appearing tired) Overall Cognitive Status: Within Functional Limits for tasks assessed  Exercises      General Comments General comments (skin integrity, edema, etc.): L hip wound vac in place.  Nursing cleared pt for participation in physical therapy.  Pt agreeable to PT session.      Pertinent Vitals/Pain Pain Assessment: Faces Faces Pain Scale: Hurts a little bit (2/10 at rest; 6/10 with activity) Pain Location: L hip and knee Pain Descriptors / Indicators: Grimacing;Tender;Sore Pain Intervention(s): Limited activity within patient's tolerance;Monitored during session;Repositioned HR WFL during sessions activities.    Home Living                      Prior Function            PT Goals (current goals can now be found in the care plan section) Acute Rehab PT Goals Patient Stated Goal: to improve strength and mobility PT  Goal Formulation: With patient Time For Goal Achievement: 02/02/21 Potential to Achieve Goals: Fair Progress towards PT goals: Progressing toward goals    Frequency    Min 2X/week      PT Plan Current plan remains appropriate    Co-evaluation              AM-PAC PT "6 Clicks" Mobility   Outcome Measure  Help needed turning from your back to your side while in a flat bed without using bedrails?: A Lot Help needed moving from lying on your back to sitting on the side of a flat bed without using bedrails?: A Lot Help needed moving to and from a bed to a chair (including a wheelchair)?: Total Help needed standing up from a chair using your arms (e.g., wheelchair or bedside chair)?: Total Help needed to walk in hospital room?: Total Help needed climbing 3-5 steps with a railing? : Total 6 Click Score: 8    End of Session Equipment Utilized During Treatment: Oxygen (11 L via nasal cannula) Activity Tolerance: Patient limited by pain;Patient limited by fatigue Patient left: in bed;with call bell/phone within reach;with bed alarm set;with nursing/sitter in room Nurse Communication: Mobility status;Precautions;Other (comment) (Nurse present and aware of pt's O2 sats) PT Visit Diagnosis: Other abnormalities of gait and mobility (R26.89);Muscle weakness (generalized) (M62.81);Pain Pain - Right/Left: Left Pain - part of body: Hip     Time: 7035-0093 PT Time Calculation (min) (ACUTE ONLY): 30 min  Charges:  $Therapeutic Exercise: 8-22 mins $Therapeutic Activity: 8-22 mins                    Hendricks Limes, PT 01/23/21, 1:28 PM

## 2021-01-23 NOTE — Progress Notes (Signed)
[facility]  Daily Progress Note   Subjective  - 7 Days Post-Op  Patient feels a little better this evening she feels that she is breathing a little easier  Objective Vitals:   01/23/21 0745 01/23/21 1135 01/23/21 1349 01/23/21 1707  BP: (!) 143/62 119/75  (!) 99/58  Pulse: 98 92  86  Resp: 14 20  16   Temp: 98.3 F (36.8 C) 99.9 F (37.7 C)  99 F (37.2 C)  TempSrc: Oral Oral  Oral  SpO2: 96% 93% 91% (!) 89%  Weight:      Height:        Intake/Output Summary (Last 24 hours) at 01/23/2021 1759 Last data filed at 01/23/2021 0510 Gross per 24 hour  Intake --  Output 950 ml  Net -950 ml    PULM  Normal effort , no use of accessory muscles CV  No JVD, RRR Abd      No distended, nontender VASC  large pulsatile abdominal mass consistent with her abdominal aortic aneurysm  Laboratory CBC    Component Value Date/Time   WBC 5.7 01/23/2021 0426   HGB 12.9 01/23/2021 0426   HCT 40.2 01/23/2021 0426   PLT 287 01/23/2021 0426    BMET    Component Value Date/Time   NA 134 (L) 01/23/2021 0426   K 3.5 01/23/2021 0426   CL 89 (L) 01/23/2021 0426   CO2 33 (H) 01/23/2021 0426   GLUCOSE 113 (H) 01/23/2021 0426   BUN 22 01/23/2021 0426   CREATININE 0.77 01/23/2021 0426   CALCIUM 8.8 (L) 01/23/2021 0426   GFRNONAA >60 01/23/2021 0426   GFRAA >60 05/02/2018 0336    Assessment/Planning:  1.  8.2 cm abdominal aortic aneurysm: I have discussed with the patient the plans for repair of her abdominal aortic aneurysm.  Her grandson as well as her sister were in attendance.  I have answered all their questions.  At this point she is as optimized as she can be considering all of her comorbidities.  Everyone certainly recognizes that she is frail but given the size of the aneurysm her risk of rupture is so high that I feel it is appropriate to move forward and once her aneurysm is fixed we can focus on her other medical problems and then potentially even repairing  her aortic valve.  I discussed this very idea with the patient and her grandson.  They both concur.  We will move forward with endovascular repair tomorrow.  05/04/2018  01/23/2021, 5:59 PM

## 2021-01-23 NOTE — Progress Notes (Signed)
Pt lying flat for greater than 20 minutes and maintaining an O2 saturation greater than 90%; MD notified.

## 2021-01-24 ENCOUNTER — Inpatient Hospital Stay: Payer: Medicare Other

## 2021-01-24 ENCOUNTER — Inpatient Hospital Stay: Payer: Medicare Other | Admitting: Anesthesiology

## 2021-01-24 ENCOUNTER — Encounter
Admission: EM | Disposition: A | Discharge status: Skilled Nursing Facility | Payer: Self-pay | Source: Home / Self Care | Attending: Internal Medicine

## 2021-01-24 ENCOUNTER — Other Ambulatory Visit (INDEPENDENT_AMBULATORY_CARE_PROVIDER_SITE_OTHER): Payer: Self-pay | Admitting: Nurse Practitioner

## 2021-01-24 DIAGNOSIS — Z7189 Other specified counseling: Secondary | ICD-10-CM | POA: Diagnosis not present

## 2021-01-24 DIAGNOSIS — R0902 Hypoxemia: Secondary | ICD-10-CM | POA: Diagnosis not present

## 2021-01-24 DIAGNOSIS — I714 Abdominal aortic aneurysm, without rupture: Secondary | ICD-10-CM

## 2021-01-24 DIAGNOSIS — L02416 Cutaneous abscess of left lower limb: Secondary | ICD-10-CM | POA: Diagnosis not present

## 2021-01-24 HISTORY — PX: ENDOVASCULAR REPAIR/STENT GRAFT: CATH118280

## 2021-01-24 LAB — BASIC METABOLIC PANEL
Anion gap: 10 (ref 5–15)
BUN: 25 mg/dL — ABNORMAL HIGH (ref 8–23)
CO2: 33 mmol/L — ABNORMAL HIGH (ref 22–32)
Calcium: 8.6 mg/dL — ABNORMAL LOW (ref 8.9–10.3)
Chloride: 90 mmol/L — ABNORMAL LOW (ref 98–111)
Creatinine, Ser: 0.74 mg/dL (ref 0.44–1.00)
GFR, Estimated: >60 mL/min (ref >60)
Glucose, Bld: 92 mg/dL (ref 70–99)
Potassium: 3.2 mmol/L — ABNORMAL LOW (ref 3.5–5.1)
Sodium: 133 mmol/L — ABNORMAL LOW (ref 135–145)

## 2021-01-24 LAB — CBC
HCT: 41.8 % (ref 36.0–46.0)
Hemoglobin: 12.9 g/dL (ref 12.0–15.0)
MCH: 23.7 pg — ABNORMAL LOW (ref 26.0–34.0)
MCHC: 30.9 g/dL (ref 30.0–36.0)
MCV: 76.8 fL — ABNORMAL LOW (ref 80.0–100.0)
Platelets: 251 10*3/uL (ref 150–400)
RBC: 5.44 MIL/uL — ABNORMAL HIGH (ref 3.87–5.11)
RDW: 19.9 % — ABNORMAL HIGH (ref 11.5–15.5)
WBC: 4.2 10*3/uL (ref 4.0–10.5)
nRBC: 0 % (ref 0.0–0.2)

## 2021-01-24 LAB — RHEUMATOID FACTOR: Rheumatoid fact SerPl-aCnc: <10 IU/mL (ref <14.0)

## 2021-01-24 LAB — BLOOD GAS, ARTERIAL
Acid-Base Excess: 10.4 mmol/L — ABNORMAL HIGH (ref 0.0–2.0)
Bicarbonate: 35.7 mmol/L — ABNORMAL HIGH (ref 20.0–28.0)
FIO2: 0.55
MECHVT: 450 mL
Mechanical Rate: 15
O2 Saturation: 99 %
PEEP: 5 cmH2O
Patient temperature: 37
RATE: 15 resp/min
pCO2 arterial: 49 mmHg — ABNORMAL HIGH (ref 32.0–48.0)
pH, Arterial: 7.47 — ABNORMAL HIGH (ref 7.350–7.450)
pO2, Arterial: 125 mmHg — ABNORMAL HIGH (ref 83.0–108.0)

## 2021-01-24 LAB — ANGIOTENSIN CONVERTING ENZYME: Angiotensin-Converting Enzyme: 42 U/L (ref 14–82)

## 2021-01-24 LAB — GLUCOSE, CAPILLARY: Glucose-Capillary: 141 mg/dL — ABNORMAL HIGH (ref 70–99)

## 2021-01-24 SURGERY — ENDOVASCULAR REPAIR/STENT GRAFT
Anesthesia: General

## 2021-01-24 SURGERY — Surgical Case
Anesthesia: *Unknown

## 2021-01-24 MED ORDER — DOCUSATE SODIUM 50 MG/5ML PO LIQD
100.0000 mg | Freq: Two times a day (BID) | ORAL | Status: DC
  Administered 2021-01-25 – 2021-02-03 (×15): 100 mg
  Filled 2021-01-24 (×18): qty 10

## 2021-01-24 MED ORDER — DEXAMETHASONE SODIUM PHOSPHATE 10 MG/ML IJ SOLN
INTRAMUSCULAR | Status: AC
  Filled 2021-01-24: qty 1

## 2021-01-24 MED ORDER — FENTANYL CITRATE (PF) 100 MCG/2ML IJ SOLN
INTRAMUSCULAR | Status: DC | PRN
  Administered 2021-01-24: 50 ug via INTRAVENOUS
  Administered 2021-01-24 (×2): 25 ug via INTRAVENOUS

## 2021-01-24 MED ORDER — DEXAMETHASONE SODIUM PHOSPHATE 10 MG/ML IJ SOLN
INTRAMUSCULAR | Status: DC | PRN
  Administered 2021-01-24: 5 mg via INTRAVENOUS

## 2021-01-24 MED ORDER — CEFAZOLIN SODIUM 1 G IJ SOLR
INTRAMUSCULAR | Status: AC
  Filled 2021-01-24: qty 20

## 2021-01-24 MED ORDER — EPHEDRINE SULFATE 50 MG/ML IJ SOLN
INTRAMUSCULAR | Status: DC | PRN
  Administered 2021-01-24: 15 mg via INTRAVENOUS

## 2021-01-24 MED ORDER — LIDOCAINE HCL (CARDIAC) PF 100 MG/5ML IV SOSY
PREFILLED_SYRINGE | INTRAVENOUS | Status: DC | PRN
  Administered 2021-01-24: 60 mg via INTRAVENOUS

## 2021-01-24 MED ORDER — PHENYLEPHRINE HCL (PRESSORS) 10 MG/ML IV SOLN
INTRAVENOUS | Status: AC
  Filled 2021-01-24: qty 1

## 2021-01-24 MED ORDER — ETOMIDATE 2 MG/ML IV SOLN
INTRAVENOUS | Status: DC | PRN
  Administered 2021-01-24: 18 mg via INTRAVENOUS

## 2021-01-24 MED ORDER — FENTANYL CITRATE (PF) 100 MCG/2ML IJ SOLN
INTRAMUSCULAR | Status: AC
  Filled 2021-01-24: qty 2

## 2021-01-24 MED ORDER — POLYETHYLENE GLYCOL 3350 17 G PO PACK
17.0000 g | PACK | Freq: Every day | ORAL | Status: DC
  Administered 2021-01-25 – 2021-01-28 (×2): 17 g
  Filled 2021-01-24 (×2): qty 1

## 2021-01-24 MED ORDER — THROMBIN 5000 UNITS EX SOLR
OROMUCOSAL | Status: DC | PRN
  Administered 2021-01-24: 5 mL via TOPICAL

## 2021-01-24 MED ORDER — ETOMIDATE 2 MG/ML IV SOLN
INTRAVENOUS | Status: AC
  Filled 2021-01-24: qty 10

## 2021-01-24 MED ORDER — LACTATED RINGERS IV SOLN: INTRAVENOUS | Status: DC | PRN

## 2021-01-24 MED ORDER — METHYLPREDNISOLONE SODIUM SUCC 125 MG IJ SOLR
INTRAMUSCULAR | Status: AC
  Filled 2021-01-24: qty 2

## 2021-01-24 MED ORDER — SUCCINYLCHOLINE CHLORIDE 200 MG/10ML IV SOSY
PREFILLED_SYRINGE | INTRAVENOUS | Status: AC
  Filled 2021-01-24: qty 10

## 2021-01-24 MED ORDER — POTASSIUM CHLORIDE 10 MEQ/100ML IV SOLN
10.0000 meq | INTRAVENOUS | Status: AC
  Administered 2021-01-24 (×2): 10 meq via INTRAVENOUS
  Filled 2021-01-24 (×3): qty 100

## 2021-01-24 MED ORDER — LIDOCAINE HCL (PF) 2 % IJ SOLN
INTRAMUSCULAR | Status: AC
  Filled 2021-01-24: qty 5

## 2021-01-24 MED ORDER — ROCURONIUM BROMIDE 100 MG/10ML IV SOLN
INTRAVENOUS | Status: DC | PRN
  Administered 2021-01-24 (×2): 20 mg via INTRAVENOUS
  Administered 2021-01-24: 10 mg via INTRAVENOUS
  Administered 2021-01-24: 25 mg via INTRAVENOUS
  Administered 2021-01-24: 10 mg via INTRAVENOUS
  Administered 2021-01-24: 25 mg via INTRAVENOUS
  Administered 2021-01-24: 10 mg via INTRAVENOUS
  Administered 2021-01-24: 40 mg via INTRAVENOUS

## 2021-01-24 MED ORDER — ONDANSETRON HCL 4 MG/2ML IJ SOLN
INTRAMUSCULAR | Status: AC
  Filled 2021-01-24: qty 2

## 2021-01-24 MED ORDER — FENTANYL CITRATE (PF) 100 MCG/2ML IJ SOLN: 25.0000 ug | INTRAMUSCULAR | Status: DC | PRN

## 2021-01-24 MED ORDER — FENTANYL BOLUS VIA INFUSION
25.0000 ug | INTRAVENOUS | Status: DC | PRN
  Filled 2021-01-24: qty 100

## 2021-01-24 MED ORDER — PROPOFOL 10 MG/ML IV BOLUS
INTRAVENOUS | Status: AC
  Filled 2021-01-24: qty 40

## 2021-01-24 MED ORDER — PANTOPRAZOLE SODIUM 40 MG IV SOLR
40.0000 mg | Freq: Every day | INTRAVENOUS | Status: DC
  Administered 2021-01-25 – 2021-02-07 (×14): 40 mg via INTRAVENOUS
  Filled 2021-01-24 (×15): qty 40

## 2021-01-24 MED ORDER — HYDROMORPHONE HCL 1 MG/ML IJ SOLN
INTRAMUSCULAR | Status: AC
  Filled 2021-01-24: qty 1

## 2021-01-24 MED ORDER — CEFAZOLIN SODIUM-DEXTROSE 2-4 GM/100ML-% IV SOLN
INTRAVENOUS | Status: AC
  Filled 2021-01-24: qty 100

## 2021-01-24 MED ORDER — PROPOFOL 10 MG/ML IV BOLUS
INTRAVENOUS | Status: DC | PRN
  Administered 2021-01-24 (×3): 20 mg via INTRAVENOUS

## 2021-01-24 MED ORDER — HYDROMORPHONE HCL 1 MG/ML IJ SOLN
INTRAMUSCULAR | Status: DC | PRN
  Administered 2021-01-24 (×2): .5 mg via INTRAVENOUS

## 2021-01-24 MED ORDER — HEPARIN SODIUM (PORCINE) 1000 UNIT/ML IJ SOLN
INTRAMUSCULAR | Status: AC
  Filled 2021-01-24: qty 1

## 2021-01-24 MED ORDER — DEXMEDETOMIDINE HCL IN NACL 400 MCG/100ML IV SOLN
0.4000 ug/kg/h | INTRAVENOUS | Status: DC
  Administered 2021-01-24 (×2): 0.8 ug/kg/h via INTRAVENOUS
  Administered 2021-01-25: 0.4 ug/kg/h via INTRAVENOUS
  Filled 2021-01-24 (×2): qty 100

## 2021-01-24 MED ORDER — SODIUM CHLORIDE 0.9 % IV SOLN
INTRAVENOUS | Status: DC | PRN
  Administered 2021-01-24: 100 ug/min via INTRAVENOUS
  Administered 2021-01-24: 2 ug via INTRAVENOUS
  Administered 2021-01-24 (×2): 100 ug via INTRAVENOUS
  Administered 2021-01-24: 100 ug/min via INTRAVENOUS
  Administered 2021-01-24: 10 ug/min via INTRAVENOUS
  Administered 2021-01-24: 100 ug via INTRAVENOUS

## 2021-01-24 MED ORDER — ONDANSETRON HCL 4 MG/2ML IJ SOLN
INTRAMUSCULAR | Status: DC | PRN
  Administered 2021-01-24: 4 mg via INTRAVENOUS

## 2021-01-24 MED ORDER — FENTANYL 2500MCG IN NS 250ML (10MCG/ML) PREMIX INFUSION
0.0000 ug/h | INTRAVENOUS | Status: DC
  Administered 2021-01-25: 250 ug/h via INTRAVENOUS
  Filled 2021-01-24: qty 250

## 2021-01-24 MED ORDER — MIDAZOLAM HCL 2 MG/2ML IJ SOLN
1.0000 mg | INTRAMUSCULAR | Status: DC | PRN
  Administered 2021-01-24 (×2): 1 mg via INTRAVENOUS
  Filled 2021-01-24 (×3): qty 2

## 2021-01-24 MED ORDER — IPRATROPIUM-ALBUTEROL 0.5-2.5 (3) MG/3ML IN SOLN
3.0000 mL | RESPIRATORY_TRACT | Status: DC | PRN
  Filled 2021-01-24: qty 3

## 2021-01-24 MED ORDER — ROCURONIUM BROMIDE 10 MG/ML (PF) SYRINGE
PREFILLED_SYRINGE | INTRAVENOUS | Status: AC
  Filled 2021-01-24: qty 10

## 2021-01-24 MED ORDER — SODIUM CHLORIDE 0.9 % IV SOLN
INTRAVENOUS | Status: DC | PRN
  Administered 2021-01-24: 250 mL via INTRAMUSCULAR

## 2021-01-24 MED ORDER — MUPIROCIN 2 % EX OINT
1.0000 "application " | TOPICAL_OINTMENT | Freq: Two times a day (BID) | CUTANEOUS | Status: AC
  Administered 2021-01-24 – 2021-01-29 (×9): 1 via NASAL
  Filled 2021-01-24 (×2): qty 22

## 2021-01-24 MED ORDER — HEPARIN SODIUM (PORCINE) 1000 UNIT/ML IJ SOLN
INTRAMUSCULAR | Status: DC | PRN
  Administered 2021-01-24 (×2): 2000 [IU] via INTRAVENOUS
  Administered 2021-01-24: 6000 [IU] via INTRAVENOUS

## 2021-01-24 MED ORDER — MIDAZOLAM HCL 2 MG/2ML IJ SOLN: 1.0000 mg | INTRAMUSCULAR | Status: DC | PRN

## 2021-01-24 MED ORDER — FENTANYL 2500MCG IN NS 250ML (10MCG/ML) PREMIX INFUSION
25.0000 ug/h | INTRAVENOUS | Status: DC
  Administered 2021-01-24: 150 ug/h via INTRAVENOUS
  Administered 2021-01-24: 100 ug/h via INTRAVENOUS

## 2021-01-24 SURGICAL SUPPLY — 110 items
BAG DECANTER FOR FLEXI CONT (MISCELLANEOUS) ×3 IMPLANT
BALLN LUTONIX 018 5X80X130 (BALLOONS) ×3
BALLOON LUTONIX 018 5X80X130 (BALLOONS) ×1 IMPLANT
BLADE SURG 15 STRL LF DISP TIS (BLADE) ×1 IMPLANT
BLADE SURG 15 STRL SS (BLADE) ×2
BLADE SURG SZ11 CARB STEEL (BLADE) ×3 IMPLANT
BOOT SUTURE AID YELLOW STND (SUTURE) ×3 IMPLANT
BRUSH SCRUB EZ  4% CHG (MISCELLANEOUS) ×6
BRUSH SCRUB EZ 4% CHG (MISCELLANEOUS) ×3 IMPLANT
CANNULA 5F STIFF (CANNULA) ×3 IMPLANT
CATH ACCU-VU SIZ PIG 5F 70CM (CATHETERS) ×3 IMPLANT
CATH ANGIO 5F 80CM MHK 2 (CATHETERS) ×3 IMPLANT
CATH BALLN CODA 9X100X32 (BALLOONS) ×6 IMPLANT
CATH BEACON 5 .035 100 C2 TIP (CATHETERS) ×3 IMPLANT
CATH BEACON 5 .035 65 C2 TIP (CATHETERS) ×3 IMPLANT
CATH INFINITI 5 FR MPA2 (CATHETERS) ×3 IMPLANT
CATH KUMPE SOFT-VU 5FR 65 (CATHETERS) ×3 IMPLANT
CATH MICROCATH PRGRT 2.8F 110 (CATHETERS) ×1 IMPLANT
CATH NAVICROSS ANGLED 135CM (MICROCATHETER) ×3 IMPLANT
CATH TEMPO 5F RIM 65CM (CATHETERS) ×3 IMPLANT
CATH VERT 5X100 (CATHETERS) ×3 IMPLANT
CLOSURE PERCLOSE PROSTYLE (VASCULAR PRODUCTS) ×18 IMPLANT
COIL 400 COMPLEX SOFT 16X50CM (Vascular Products) ×3 IMPLANT
COIL 400 COMPLEX STD 16X60CM (Vascular Products) ×3 IMPLANT
COVER DRAPE FLUORO 36X44 (DRAPES) ×6 IMPLANT
COVER PROBE U/S 5X48 (MISCELLANEOUS) ×9 IMPLANT
COVER SURGICAL LIGHT HANDLE (MISCELLANEOUS) ×3 IMPLANT
DERMABOND ADVANCED (GAUZE/BANDAGES/DRESSINGS) ×4
DERMABOND ADVANCED .7 DNX12 (GAUZE/BANDAGES/DRESSINGS) ×2 IMPLANT
DEVICE OCCLUSION PODJ15 (Vascular Products) ×1 IMPLANT
DEVICE OCCLUSION PODJ30 (Vascular Products) ×1 IMPLANT
DEVICE SAFEGUARD 24CM (GAUZE/BANDAGES/DRESSINGS) ×6 IMPLANT
DEVICE TORQUE (MISCELLANEOUS) ×3 IMPLANT
DRAPE BRACHIAL (DRAPES) ×6 IMPLANT
DRAPE ORTHO 2.5IN SPLIT 77X108 (DRAPES) ×1 IMPLANT
DRAPE ORTHO SPLIT 77X108 STRL (DRAPES) ×2
DRYSEAL FLEXSHEATH 12FR 33CM (SHEATH) ×4
DRYSEAL FLEXSHEATH 18FR 33CM (SHEATH) ×2
ELECT CAUTERY BLADE 6.4 (BLADE) ×3 IMPLANT
ELECT REM PT RETURN 9FT ADLT (ELECTROSURGICAL) ×6
ELECTRODE REM PT RTRN 9FT ADLT (ELECTROSURGICAL) ×2 IMPLANT
EXCLDR EXT ENDO 32X4.5 18F (Endovascular Graft) ×6 IMPLANT
EXCLDR TRNK ENDO 32X14.5X14 18 (Endovascular Graft) ×3 IMPLANT
EXCLUDER EXT ENDO 32X4.5 18F (Endovascular Graft) ×2 IMPLANT
EXCLUDER TNK END 32X14.5X14 18 (Endovascular Graft) ×1 IMPLANT
GLIDEWIRE ADV .035X260CM (WIRE) ×3 IMPLANT
GLIDEWIRE ANGLED SS 035X260CM (WIRE) ×3 IMPLANT
GLOVE SURG SYN 6.5 ES PF (GLOVE) ×15 IMPLANT
GLOVE SURG SYN 7.0 (GLOVE) ×6 IMPLANT
GLOVE SURG SYN 8.0 (GLOVE) ×6 IMPLANT
GOWN STRL REUS W/ TWL LRG LVL3 (GOWN DISPOSABLE) ×3 IMPLANT
GOWN STRL REUS W/ TWL XL LVL3 (GOWN DISPOSABLE) ×1 IMPLANT
GOWN STRL REUS W/TWL LRG LVL3 (GOWN DISPOSABLE) ×6
GOWN STRL REUS W/TWL XL LVL3 (GOWN DISPOSABLE) ×2
GUIDEWIRE ANGLED .035 180CM (WIRE) ×3 IMPLANT
HANDLE DETACHMENT COIL (MISCELLANEOUS) ×3 IMPLANT
IV NS 500ML (IV SOLUTION) ×2
IV NS 500ML BAXH (IV SOLUTION) ×1 IMPLANT
KIT CV MULTILUMEN 7FR 20 (SET/KITS/TRAYS/PACK) ×3
KIT CV MULTILUMEN 7FR 20 SUB (SET/KITS/TRAYS/PACK) ×1 IMPLANT
KIT ENCORE 26 ADVANTAGE (KITS) ×6 IMPLANT
LEG CONTRALATERAL 16X12X14 (Vascular Products) ×2 IMPLANT
LEG CONTRALATERAL 16X20X13.5 (Vascular Products) ×2 IMPLANT
LOOP RED MAXI  1X406MM (MISCELLANEOUS) ×6
LOOP VESSEL MAXI 1X406 RED (MISCELLANEOUS) ×3 IMPLANT
LOOP VESSEL MINI 0.8X406 BLUE (MISCELLANEOUS) ×2 IMPLANT
LOOPS BLUE MINI 0.8X406MM (MISCELLANEOUS) ×4
MICROCATH PROGREAT 2.8F 110 CM (CATHETERS) ×3
NEEDLE FILTER BLUNT 18X 1/2SAF (NEEDLE) ×2
NEEDLE FILTER BLUNT 18X1 1/2 (NEEDLE) ×1 IMPLANT
OCCLUSION DEVICE PODJ15 (Vascular Products) ×3 IMPLANT
OCCLUSION DEVICE PODJ30 (Vascular Products) ×3 IMPLANT
PACK ANGIOGRAPHY (CUSTOM PROCEDURE TRAY) ×3 IMPLANT
PACK BASIN MAJOR ARMC (MISCELLANEOUS) ×3 IMPLANT
SHEATH BRITE TIP 6FRX11 (SHEATH) ×6 IMPLANT
SHEATH BRITE TIP 6FRX5.5 (SHEATH) ×6 IMPLANT
SHEATH BRITE TIP 8FRX11 (SHEATH) ×6 IMPLANT
SHEATH DRYSEAL FLEX 12FR 33CM (SHEATH) ×2 IMPLANT
SHEATH DRYSEAL FLEX 18FR 33CM (SHEATH) ×1 IMPLANT
SHEATH GUIDING CAROTID 6FRX90 (SHEATH) ×3 IMPLANT
SHEATH RAABE 6FRX70 (SHEATH) ×3 IMPLANT
SPONGE XRAY 4X4 16PLY STRL (MISCELLANEOUS) ×15 IMPLANT
STENT GRAFT CONTRALAT 16X12X14 (Vascular Products) ×1 IMPLANT
STENT GRAFT CONTRALAT 20X13.5 (Vascular Products) ×1 IMPLANT
STENT LIFESTREAM 6X26X135 (Permanent Stent) IMPLANT
STENT LIFESTREAM 6X37X135 (Permanent Stent) ×3 IMPLANT
STENT VIABAHN 6X7.5X120 (Permanent Stent) ×3 IMPLANT
STOCKINETTE IMPERV 14X48 (MISCELLANEOUS) ×3 IMPLANT
SUT MNCRL 4-0 (SUTURE) ×4
SUT MNCRL 4-0 27XMFL (SUTURE) ×2
SUT PROLENE 6 0 BV (SUTURE) ×18 IMPLANT
SUT SILK 2 0 (SUTURE) ×2
SUT SILK 2-0 18XBRD TIE 12 (SUTURE) ×1 IMPLANT
SUT SILK 3 0 (SUTURE) ×2
SUT SILK 3-0 18XBRD TIE 12 (SUTURE) ×1 IMPLANT
SUT SILK 4 0 (SUTURE) ×2
SUT SILK 4-0 18XBRD TIE 12 (SUTURE) ×1 IMPLANT
SUT VIC AB 2-0 CT1 27 (SUTURE) ×4
SUT VIC AB 2-0 CT1 TAPERPNT 27 (SUTURE) ×2 IMPLANT
SUT VICRYL+ 3-0 36IN CT-1 (SUTURE) ×6 IMPLANT
SUTURE MNCRL 4-0 27XMF (SUTURE) ×2 IMPLANT
SYR 20ML LL LF (SYRINGE) ×3 IMPLANT
SYR BULB IRRIG 60ML STRL (SYRINGE) ×3 IMPLANT
SYR MEDRAD MARK 7 150ML (SYRINGE) ×3 IMPLANT
TUBING CONTRAST HIGH PRESS 72 (TUBING) ×3 IMPLANT
VALVE CHECKFLO PERFORMER (SHEATH) ×3 IMPLANT
WIRE AMPLATZ SSTIFF .035X260CM (WIRE) ×6 IMPLANT
WIRE G.018X260 SHT (WIRE) ×6 IMPLANT
WIRE GUIDERIGHT .035X150 (WIRE) ×6 IMPLANT
WIRE MAGIC TORQUE 260C (WIRE) ×6 IMPLANT

## 2021-01-24 NOTE — Plan of Care (Signed)

## 2021-01-24 NOTE — Op Note (Signed)
OPERATIVE NOTE   PROCEDURE: US guidance for vascular access, bilateral femoral arteries Catheter placement into aorta from bilateral femoral approaches Catheter placement into right hypogastric artery from right femoral approach with selective right hypogastric artery angiogram Coil embolization of the right hypogastric artery with a pair of 16 mm diameter by 60 cm length Ruby coils and a 30 cm length packing: Left brachial artery cutdown for placement of device Catheter placement into bilateral renal arteries the left from the left brachial artery and the right from the left femoral artery Stent placement going up superior to the renal artery into the left renal artery with a 6 mm diameter by 37 mm length lifestream stent and a 6 mm diameter by 26 mm length lifestream stent Stent placement x1 to the right renal artery with a 6 mm diameter by 7.5 cm length Viabahn stent Placement of a 32 mm diameter proximal 14 cm length conformable Gore Excluder Endoprosthesis main body right with a 20 mm diameter by 14 cm length left iliac contralateral limb Placement of a 12 mm diameter by 14 cm length right iliac extension limb into the right external iliac artery Placement of a 32 mm diameter aortic extension cuff x 2 going up superior to the renal arteries ProGlide closure devices bilateral femoral arteries  PRE-OPERATIVE DIAGNOSIS: AAA  POST-OPERATIVE DIAGNOSIS: same  SURGEON: Leotis Pain, MD and Hortencia Pilar, MD - Co-surgeons  ASSISTANT: Elmore Guise, MD  ANESTHESIA: General  ESTIMATED BLOOD LOSS: 100 cc  FINDING(S): 1.  AAA  SPECIMEN(S):  none  INDICATIONS:   Monica [name] is a 87 y.o. 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  She was not a candidate for open surgical repair due to comorbidities. The anatomy was suitable for endovascular repair with concomitant snorkeling of the left renal artery and potentially the right renal  artery.  Risks and benefits of repair in an endovascular fashion were discussed and informed consent was obtained. Co-surgeons are used to expedite the procedure and reduce operative time as bilateral work needs to be done.  DESCRIPTION: After obtaining full informed written consent, the patient was brought back to the operating room and placed supine upon the operating table.  The patient received IV antibiotics prior to induction.  After obtaining adequate anesthesia, the patient was prepped and draped in the standard fashion for endovascular AAA repair.  We then began by gaining access to both femoral arteries with US guidance with me working on the left and Dr. Delana Meyer working on the right.  The femoral arteries were found to be patent and accessed without difficulty with a needle under ultrasound guidance without difficulty on each side and permanent images were recorded.  We then placed 2 proglide devices on each side in a pre-close fashion and placed 8 French sheaths.  A cutdown was then performed overlying the left brachial artery dissecting out the brachial artery.  This was small but not significantly diseased.  This was then accessed with a micropuncture needle and a micropuncture wire and sheath were placed and we upsized to a 6 French sheath in the left brachial artery.  The patient was then given 6000 units of intravenous heparin.  Additional heparin was given later in the procedure.  Dr. Delana Meyer started by cannulating the right hypogastric artery with a rim catheter and a Glidewire and selective imaging of the right hypogastric artery was then performed.  He then advanced a prograde microcatheter and deployed a total of  3 Ruby coils in the right hypogastric artery to get a full successful coil embolization.  The Pigtail catheter was placed into the aorta from the left side. Using this image, we selected a 32 mm diameter conformable Gore Excluder Main body device.  Over a stiff wire, an 53 French  sheath was placed up to the right. The main body was then placed through the 18 French sheath. A Kumpe catheter was placed up the left side and a magnified image at the renal arteries was performed.  From the left brachial approach, we tediously cannulated the left renal artery ultimately with a C2 catheter and an advantage wire.  This was a tedious cannulation due to the angle and the proximal disease in the left renal artery.  Selective imaging showed that this was a greater than 60% stenosis in the proximal left renal artery.  We then advanced the 6 French 90 cm sheath into the left radial artery over the advantage wire in situ catheter and exchanged for a Magic torque wire.  We then selected a 6 mm diameter by 37 mm length lifestream stent and placed this in the left renal artery although it was not deployed until after the deployment of the main body.  Using this image, we then the main body was then deployed just below the right renal artery.  It was above the origin of the left renal artery.  The Kumpe catheter was used to cannulate the contralateral gate without difficulty and successful cannulation was confirmed by twirling the pigtail catheter in the main body. We then placed a stiff wire and a retrograde arteriogram was performed through the left femoral sheath. We upsized to the 12 [place] sheath for the contralateral limb and a 20 mm diameter by 14 cm length left iliac limb was selected and deployed. The main body deployment was then completed.  After the main body deployment was completed, we deployed the stent in the left renal artery extending this out about 5 to 7 mm proximal to the proximal edge of the stent graft in the aorta.  The compliant balloon was then taken up in the aorta at the proximal seal zone and the balloon was reinflated to 4 atm to restore patency in the renal artery.  Based off the angiographic findings, extension limbs were necessary.  A 12 mm diameter by 14 cm length right iliac  extension limb was then taken down about 3 to 4 cm into the right external iliac artery. All junction points and seals zones were treated with the compliant balloon. The pigtail catheter was then replaced and a completion angiogram was performed.  A proximal type I endoleak was detected on completion angiography. The renal arteries were found to be widely patent.  After trying to reballoon the area and iron out any gutter leaks, there remained a significantly large type I endoleak proximally.  We also added an additional 32 mm aortic cuff proximally, but a large type I endoleak persisted.  At this point, we felt there was no other option rather than to perform a periscope procedure on the right renal artery and extend the stent graft up to the base of the SMA.  From left femoral approach the right renal artery was easily cannulated with a C2 catheter and selective imaging showed a mildly diseased right renal artery at the origin that was widely patent distally.  We then exchanged for a 0.018 wire and selected a 6 mm diameter by 75 mm length Viabahn stent.  An additional 32 mm diameter aortic extension cuff was taken up to the base of the SMA and deployed by Dr. Delana Meyer in the Viabahn stent was then deployed and postdilated with a 5 mm balloon.  At this point, the left renal stent was not above the stent graft and we added an additional 6 mm diameter by 26 mm length lifestream stent in the left renal artery bridging up into the aorta above the new stent graft at the base of the SMA.  The left renal balloons were inflated after compliant balloon was used to help get seal proximally.  Following these maneuvers, there appeared to be a small gutter leak that was markedly improved on the left aorta but minimal flow into the sac.  At this point we elected to terminate the procedure. We secured the pro glide devices for hemostasis on the femoral arteries. The skin incision was closed with a 4-0 Monocryl. Dermabond and  pressure dressing were placed.  The left brachial artery was repaired with a series of interrupted 6-0 Prolene sutures.  Wound was then irrigated and closed with 3-0 Vicryl and 4-0 Monocryl.  The patient was taken to the recovery room in stable condition having tolerated the procedure well.  COMPLICATIONS: none  CONDITION: stable  Leotis Pain  01/24/2021, 2:13 PM   This note was created with Dragon Medical transcription system. Any errors in dictation are purely unintentional.

## 2021-01-24 NOTE — Progress Notes (Signed)
Daily Progress Note   Patient Name: Monica [name]       Date: 01/24/2021 DOB: 1934/01/08  Age: 87 y.o. MRN#: [id number] Attending Physician: Arnetha Courser, MD Primary Care Physician: Patient, No Pcp Per (Inactive) Admit Date: 01/15/2021  Reason for Consultation/Follow-up: Establishing goals of care  Subjective: Went to OR today for EVAR. Patient now in ICU; she remains intubated post op. No family at bedside. Will follow.   Length of Stay: 9  Current Medications: Scheduled Meds:  . Chlorhexidine Gluconate Cloth  6 each Topical Daily  . docusate  100 mg Per Tube BID  . enoxaparin (LOVENOX) injection  40 mg Subcutaneous Q24H  . feeding supplement  237 mL Oral BID BM  . ipratropium-albuterol  3 mL Nebulization Q6H  . methylPREDNISolone (SOLU-MEDROL) injection  60 mg Intravenous Q12H  . midodrine  2.5 mg Oral TID WC  . multivitamin with minerals  1 tablet Oral Daily  . pantoprazole (PROTONIX) IV  40 mg Intravenous Daily  . polyethylene glycol  17 g Per Tube Daily  . rifampin  300 mg Oral BID WC  . rosuvastatin  10 mg Oral Daily    Continuous Infusions: . fentaNYL infusion INTRAVENOUS 100 mcg/hr (01/24/21 1526)  . methocarbamol (ROBAXIN) IV    . potassium chloride    . promethazine (PHENERGAN) injection (IM or IVPB)    . vancomycin 1,250 mg (01/23/21 2155)    PRN Meds: bisacodyl, fentaNYL, fentaNYL (SUBLIMAZE) injection, fentaNYL (SUBLIMAZE) injection, guaiFENesin-dextromethorphan, heparin 5000 units in normal saline injection (flush), ipratropium-albuterol, lip balm, methocarbamol **OR** methocarbamol (ROBAXIN) IV, metoCLOPramide **OR** metoCLOPramide (REGLAN) injection, midazolam, midazolam, nitroGLYCERIN, ondansetron **OR** ondansetron (ZOFRAN) IV, promethazine **OR** promethazine  (PHENERGAN) injection (IM or IVPB) **OR** promethazine, senna-docusate, sodium phosphate, Surgifoam 1 Gm with Thrombin 5,000 units (5 ml) topical solution, traZODone  Physical Exam Constitutional:      Comments: On ventilator.             Vital Signs: BP 115/70   Pulse 71   Temp 97.6 F (36.4 C) (Oral)   Resp 15   Ht 5\' 5"  (1.651 m)   Wt 106.6 kg   SpO2 98%   BMI 39.11 kg/m  SpO2: SpO2: 98 % O2 Device: O2 Device: Ventilator O2 Flow Rate: O2 Flow Rate (L/min): 11 L/min  Intake/output summary:  Intake/Output Summary (Last  24 hours) at 01/24/2021 1536 Last data filed at 01/24/2021 1419 Gross per 24 hour  Intake 1100 ml  Output 1000 ml  Net 100 ml   LBM: Last BM Date: 01/23/21 Baseline Weight: Weight: 106.6 kg Most recent weight: Weight: 106.6 kg   Patient Active Problem List   Diagnosis Date Noted  . Shortness of breath   . Pre-op evaluation   . Abdominal aortic aneurysm (AAA) without rupture (HCC)   . Obesity, Class III, BMI 40-49.9 (morbid obesity) (HCC)   . Abscess of left hip 01/15/2021  . Acute respiratory failure with hypoxia (HCC) 10/05/2020  . Severe aortic stenosis   . Closed comminuted intertrochanteric fracture of proximal end of left femur (HCC) 10/02/2020  . Aortic stenosis   . Obesity   . Cellulitis of left lower extremity 04/29/2018  . GI bleed 06/05/2017  . Nausea vomiting and diarrhea   . Acute ST elevation myocardial infarction (STEMI) of inferior wall (HCC) 06/02/2017  . CAD (coronary artery disease) 2018    Palliative Care Assessment & Plan    Recommendations/Plan: In ICU intubated post-op.  Will continue to follow.    Code Status:    Code Status Orders  (From admission, onward)           Start     Ordered   01/23/21 1543  Do not attempt resuscitation (DNR)  Continuous       Question Answer Comment  In the event of cardiac or respiratory ARREST Do not call a "code blue"   In the event of cardiac or respiratory ARREST Do not  perform Intubation, CPR, defibrillation or ACLS   In the event of cardiac or respiratory ARREST Use medication by any route, position, wound care, and other measures to relive pain and suffering. May use oxygen, suction and manual treatment of airway obstruction as needed for comfort.   Comments MOST form on chart.      01/23/21 1542           Code Status History     Date Active Date Inactive Code Status Order ID Comments User Context   01/15/2021 2309 01/23/2021 1542 Full Code [id number]  [facility]ville Care Vernetta Honey, MD Inpatient   10/02/2020 1117 10/09/2020 2150 Full Code [id number]  Lucile Shutters, MD ED   04/30/2018 1018 05/02/2018 1808 DNR [id number]  Adrian Saran, MD Inpatient   04/29/2018 1732 04/30/2018 1018 Full Code [id number]  Houston Siren, MD Inpatient   06/05/2017 0521 06/07/2017 1404 Full Code [id number]  Ihor Austin, MD Inpatient   06/02/2017 0059 06/04/2017 1932 Full Code [id number]  Iran Ouch, MD Inpatient      Advance Directive Documentation    Flowsheet Row Most Recent Value  Type of Advance Directive Healthcare Power of Attorney, Living will  Pre-existing out of facility DNR order (yellow form or pink MOST form) --  "MOST" Form in Place? --       Prognosis:  Poor overall    Care plan was discussed with CCM  Thank you for allowing the Palliative Medicine Team to assist in the care of this patient.       Total Time 15 min Prolonged Time Billed  no       Greater than 50%  of this time was spent counseling and coordinating care related to the above assessment and plan.  Morton Stall, NP  Please contact Palliative Medicine Team phone at [phone] for questions and concerns.

## 2021-01-24 NOTE — Progress Notes (Addendum)
1500 Patient received from Vascular lab intubated and paralyzed. Mouth care every 2 hours uncharted. Pulses checked as ordered. Son called for update.

## 2021-01-24 NOTE — Consult Note (Signed)
NAME:  Monica [name], MRN:  [id number], DOB:  03/09/1934, LOS: 9 ADMISSION DATE:  01/15/2021, CONSULTATION DATE:  01/24/2021 REFERRING MD:  Dr. Lucky Cowboy, CHIEF COMPLAINT:  Post-op Respiratory Failure   Brief patient description/synopsis:  87 y.o. Female admitted with post-op MRSA infection of left hip.  Incidentally found to have large AAA, status post endovascular repair on 01/24/21.  Returns to ICU post procedure and remains intubated.  History of Present Illness:  Monica [name] is a 87 y.o. Female with a past medical history significant for  CAD s/p PCI x1 in 2018, severe AS being worked up for TAVR, Obesity, pHTN, and COPD who presented to [facility] ED on 6/6/200 with left hip pain, swelling, erythema, tenderness, and warmth.   She had hip fracture after fall in Feb, underwent left hip intramedullary nailing, and was discharged home.  Since being home 4 months ago, really never able to walk due to pain. Finally, in the last week before admission, developed redness and worse pain at left hip surgical site, then wound dehisced.  ED COURSE: Evaluation in the ED showed a subcutaneous fluid collection on CT Scan.  Additionally CT scan showed a large infrarenal aortic aneurysm. Orthopedics and Vascular Surgery was consulted. Cultures of the left hip wound was positive for MRSA.    She was admitted for further workup and treatment of post-surgical hip infection with MRSA.    HOSPITAL COURSE: On 01/16/21 she underwent irrigation and debridement of her left hip postoperative wound. ID was consulted, and is recommending 4 weeks of vancomycin and rifampin.    Given her severe aortic stenosis, plan is for TAVR work-up after AAA repair.  On 01/24/21 she underwent Endovascular repair of the AAA.  Post procedure she remains intubated and is transferred to ICU.  PCCM is consulted for management of ventilator.  Pertinent  Medical History  Aortic Stenosis Coronary Artery Disease Hyperlipidemia Recent Left Hip  Fracture in February 2022  Micro Data:  01/15/2021: SARS-CoV-2 and influenza PCR>> negative 01/15/2021: Blood culture>>STAPHYLOCOCCUS EPIDERMIDIS (suspected contaminant) 01/15/2021: Repeat blood culture>> no growth 01/16/2021: Left hip wound abscess>> MRSA 01/12/2021: Blood culture x2>> no growth  Antimicrobials:  Cefazolin 6/15 x 1 dose (surgical prophylaxis) Cefepime 6/7>> 6/9 Zosyn 6/6>> 6/6 Rifampin 6/7>> Vancomycin 6/6>> P.o. fluconazole 6/10>> 6/10  Significant Hospital Events: Including procedures, antibiotic start and stop dates in addition to other pertinent events   01/15/2021: Admitted to [place]; Orthopedics, Vascular Surgery consulted 01/16/2021: Underwent irrigation and debridement of left hip wound 01/24/2021: Status post endovascular repair of AAA, returns to ICU and remains intubated postop  Interim History / Subjective:  -Underwent Endovascular repair of AAA today -Returns to ICU post procedure and remains intubated -PCCM consulted for vent management -Hemodynamically stable, no Vasopressors  Objective   Blood pressure 118/64, pulse 84, temperature 98.7 F (37.1 C), temperature source Oral, resp. rate 16, height 5' 5"  (1.651 m), weight 106.6 kg, SpO2 99 %.    FiO2 (%):  [98 %] 98 %   Intake/Output Summary (Last 24 hours) at 01/24/2021 1515 Last data filed at 01/24/2021 1419 Gross per 24 hour  Intake 1100 ml  Output 1000 ml  Net 100 ml   Filed Weights   01/15/21 1638  Weight: 106.6 kg    Examination: General: Acute on chronically ill-appearing female, laying in bed, intubated sedated, no acute distress HENT: Atraumatic, normocephalic, neck supple, no JVD Lungs: Mechanical breath sounds bilaterally, even, occasional breaths over the ventilator Cardiovascular: Regular rate and rhythm, + murmur Abdomen: Obese, soft,  nontender, nondistended, no guarding or rebound tenderness Extremities: Bilateral lower extremities: Dusky in appearance Neuro: Lethargic/lightly  sedated (just arrived from PACU), withdraws from pain, pupils PERRLA sluggish 2 mm bilaterally GU: Foley catheter in place draining yellow urine Skin: Bilateral PAD's is in place clean dry and intact  Labs/imaging that I havepersonally reviewed  (right click and "Reselect all SmartList Selections" daily)  Labs 01/24/2021: Sodium 133, potassium 3.2, bicarb 33, BUN 25, creatinine 0.74,  Chest x-ray 6/15>>1. Appropriately positioned endotracheal tube and right internal jugular central venous catheter. 2. Pulmonary fibrosis without superimposed acute process. CT Left Hip 6/6>>1. Status post ORIF of a comminuted intertrochanteric fracture of the left femur with displaced greater and lesser trochanteric fracture fragments, grossly similar alignment compared to intraoperative fluoroscopic images. No bridging bone formation across the fracture site. 2. Small fluid collection within the subcutaneous soft tissues, likely along a surgical incision site the level of the left iliac wing measuring approximately 4.1 x 0.8 x 3.2 cm. Findings may represent a postoperative seroma or hematoma. Infected fluid collection would be difficult to exclude by imaging alone. 3. Partially visualized infrarenal abdominal aortic aneurysm with extensive mural thrombus measuring approximately 6.7 x 6.1 cm. Recommend referral to a vascular specialist. This recommendation follows ACR consensus guidelines: White Paper of the ACR Incidental Findings Committee II on Vascular Findings. J Am Coll Radiol 2013; 10:789-794. 4. Proximal right common iliac artery aneurysm measuring approximately 2.5 cm CTA Chest/Abdomen/Pelvis 6/8>>1. Infrarenal abdominal aortic aneurysm measuring up to 7.7 x 7.6 cm. Recommend referral to a vascular specialist. This recommendation follows ACR consensus guidelines: White Paper of the ACR Incidental Findings Committee II on Vascular Findings. J Am Coll Radiol 2013; 10:789-794. 2. Aneurysm of the  right common iliac artery measuring up to 2.9 x 2.6 cm. 3. 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 4. 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 5. Mediastinal and hilar lymphadenopathy similar to prior examination. This may be due to inflammation or malignancy. 6. Incompletely characterized 1.2 cm mass at the inferior tip of the left kidney should be further evaluated with contrast enhanced CT or MRI of the abdomen on nonemergent basis. 7. 5 mm right upper lobe pulmonary nodule is unchanged since prior exam. No follow-up needed if patient is low-risk. Non-contrast chest CT can be considered in 12 months if patient is high-risk. This recommendation follows the consensus statement: Guidelines for Management of Incidental Pulmonary Nodules Detected on CT Images: From the Fleischner Society 2017; Radiology 2017; 284:228-243. CTA Chest 6/11>>No evidence of pulmonary embolus. Mild cardiomegaly.  Evidence of pulmonary arterial hypertension. Peripheral interstitial thickening and ground-glass opacities likely reflects fibrosis. Dependent and bibasilar atelectasis or scarring. Stable mediastinal and bilateral hilar adenopathy. Coronary artery disease. Cholelithiasis. Aortic Atherosclerosis CTA Abdomen 6/12>>1. Stable 7.8 x 7.6 cm infrarenal abdominal aortic aneurysm. No evidence for acute rupture. 2. Stable right common iliac artery aneurysm with maximum diameter of 2.7 cm. 3. Stable advanced atherosclerotic calcifications involving the aorta and branch vessels. 4. Stable significant chronic lung disease and mediastinal adenopathy. 5. Stable cholelithiasis. 6. Stable 13 mm lower pole left renal lesion. As before recommend follow-up MRI abdomen for further evaluation.   Aortic Atherosclerosis  Resolved Hospital Problem list     Assessment & Plan:   Post-Op Respiratory Failure Acute on Chronic Hypoxic Respiratory  Failure due to Aortic Stenosis, CHF, Pulmonary HTN, and Pulmonary Fibrosis -Full vent support, implement lung protective strategies -Wean FiO2 and  PEEP as tolerated to maintain O2 sats >90% -Follow intermittent chest x-ray and ABG as needed -Spontaneous breathing trials when respiratory parameters met and mental status permits -Implement VAP bundle -As needed bronchodilators -Continue Solu-Medrol BID -CT angiogram of the chest with patchy subpleural fibrosis, no PE, moderate atelectasis, no edema or large consolidation, no effusion.  Chronic HFpEF PMHx of Severe Aortic Stenosis, CAD -Continuous cardiac monitoring -Maintain MAP greater than 65 -Continue midodrine -Diuresis as blood pressure and renal function permits -Cardiology following, appreciate input -Plan for TAVR workup  8.2 cm Juxtarenal Aortic Aneurysm status post endovascular repair on 01/24/2021 -Vascular surgery following  Postsurgical MRSA Hip Infection s/p Debridement of Left Hip wound on 01/16/21 -Monitor fever curve -Trend WBC's -Follow cultures as above -1/2 blood cultures + for staph epidermidis which was felt to be comtaminant -ID following, appreciate input -ABX as per ID, currently on Vancomycin & Rifampin (recommends 4 weeks of treatment)    Pt with multiple co-morbidities, long term prognosis is very poor.  Anticipate difficult wean from ventilator.  Palliative Care is following for goals of care.    Best practice (right click and "Reselect all SmartList Selections" daily)  Diet:  NPO Pain/Anxiety/Delirium protocol (if indicated): Yes (RASS goal -1) VAP protocol (if indicated): Yes DVT prophylaxis: LMWH GI prophylaxis: PPI Glucose control:  SSI No Central venous access:  N/A Arterial line:  N/A Foley:  Yes, and it is still needed Mobility:  bed rest  PT consulted: N/A Last date of multidisciplinary goals of care discussion [N/A] Code Status:  DNR Disposition: ICU  Labs   CBC: Recent Labs  Lab  01/20/21 0132 01/21/21 0451 01/22/21 0452 01/23/21 0426 01/24/21 0414  WBC 9.5 5.8 6.6 5.7 4.2  HGB 13.4 11.7* 12.0 12.9 12.9  HCT 45.1 37.4 38.7 40.2 41.8  MCV 79.7* 75.7* 76.0* 74.2* 76.8*  PLT 226 298 296 287 161    Basic Metabolic Panel: Recent Labs  Lab 01/20/21 0132 01/21/21 0451 01/22/21 0452 01/23/21 0426 01/24/21 0414  NA 134* 134* 135 134* 133*  K 4.4 3.3* 3.9 3.5 3.2*  CL 97* 94* 93* 89* 90*  CO2 28 32 35* 33* 33*  GLUCOSE 104* 95 103* 113* 92  BUN 23 19 19 22  25*  CREATININE 0.82 0.73 0.74 0.77 0.74  CALCIUM 9.1 8.7* 8.6* 8.8* 8.6*   GFR: Estimated Creatinine Clearance: 62.3 mL/min (by C-G formula based on SCr of 0.74 mg/dL). Recent Labs  Lab 01/21/21 0451 01/22/21 0452 01/23/21 0426 01/24/21 0414  WBC 5.8 6.6 5.7 4.2    Liver Function Tests: Recent Labs  Lab 01/23/21 0426  AST 26  ALT 11  ALKPHOS 60  BILITOT 1.9*  PROT 7.6  ALBUMIN 3.0*   No results for input(s): LIPASE, AMYLASE in the last 168 hours. No results for input(s): AMMONIA in the last 168 hours.  ABG    Component Value Date/Time   PHART 7.50 (H) 10/07/2020 1130   PCO2ART 48 10/07/2020 1130   PO2ART 69 (L) 10/07/2020 1130   HCO3 37.4 (H) 10/07/2020 1130   O2SAT 95.1 10/07/2020 1130     Coagulation Profile: No results for input(s): INR, PROTIME in the last 168 hours.  Cardiac Enzymes: No results for input(s): CKTOTAL, CKMB, CKMBINDEX, TROPONINI in the last 168 hours.  HbA1C: Hgb A1c MFr Bld  Date/Time Value Ref Range Status  06/02/2017 06:15 AM 5.7 (H) 4.8 - 5.6 % Final    Comment:    (NOTE) Pre diabetes:  5.7%-6.4% Diabetes:              >6.4% Glycemic control for   <7.0% adults with diabetes     CBG: No results for input(s): GLUCAP in the last 168 hours.  Review of Systems:   Unable to assess due to Intubation/sedation  Past Medical History:  She,  has a past medical history of Aortic stenosis, CAD (coronary artery disease) (2018), GI bleed, HLD  (hyperlipidemia), and Obesity.   Surgical History:   Past Surgical History:  Procedure Laterality Date   ANKLE RECONSTRUCTION  1956   also ORIF of right arm   CORONARY STENT INTERVENTION N/A 06/01/2017   Procedure: Coronary/Graft Acute MI Revascularization;  Surgeon: Wellington Hampshire, MD;  Location: [place] [facility];  Service: Cardiovascular;  Laterality: N/A;   INCISION AND DRAINAGE HIP Left 01/16/2021   Procedure: IRRIGATION AND DEBRIDEMENT HIP;  Surgeon: Leim Fabry, MD;  Location: [facility];  Service: Orthopedics;  Laterality: Left;   INTRAMEDULLARY (IM) NAIL INTERTROCHANTERIC Left 10/03/2020   Procedure: INTRAMEDULLARY (IM) NAIL INTERTROCHANTRIC;  Surgeon: Leim Fabry, MD;  Location: [facility];  Service: Orthopedics;  Laterality: Left;   LEFT HEART CATH AND CORONARY ANGIOGRAPHY N/A 06/01/2017   Procedure: LEFT HEART CATH AND CORONARY ANGIOGRAPHY;  Surgeon: Wellington Hampshire, MD;  Location: [place] [facility];  Service: Cardiovascular;  Laterality: N/A;     Social History:   reports that she quit smoking about 3 years ago. Her smoking use included cigarettes. She has a 25.00 pack-year smoking history. She has never used smokeless tobacco. She reports that she does not drink alcohol and does not use drugs.   Family History:  Her family history includes Diabetes Mellitus II in her daughter; Valvular heart disease in her mother.   Allergies No Known Allergies   Home Medications  Prior to Admission medications   Medication Sig Start Date End Date Taking? Authorizing Provider  aspirin 81 MG EC tablet Take 81 mg by mouth daily.   Yes [provider]  carvedilol (COREG) 3.125 MG tablet Take 3.125 mg by mouth 2 (two) times daily with a meal.   Yes [provider]  oxyCODONE (OXY IR/ROXICODONE) 5 MG immediate release tablet Take 5 mg by mouth every 4 (four) hours as needed for severe pain.   Yes [provider]  rosuvastatin (CRESTOR) 10 MG tablet Take 1  tablet by mouth once daily Patient taking differently: Take 10 mg by mouth daily. 07/24/20  Yes Wellington Hampshire, MD  oxyCODONE (OXY IR/ROXICODONE) 5 MG immediate release tablet Take 1-2 tablets (5-10 mg total) by mouth every 4 (four) hours as needed for moderate pain (pain score 4-6). 01/19/21   Lattie Corns, PA-C     Critical care time: 55 minutes     Darel Hong, AGACNP-BC [facility] Prefer epic messenger for cross cover needs If after hours, please call E-link

## 2021-01-24 NOTE — Anesthesia Preprocedure Evaluation (Addendum)
Anesthesia Evaluation  Patient identified by MRN, date of birth, ID band Patient awake    Reviewed: Allergy & Precautions, NPO status , Patient's Chart, lab work & pertinent test results  History of Anesthesia Complications Negative for: history of anesthetic complications  Airway Mallampati: II  TM Distance: >3 FB Neck ROM: Full    Dental  (+) Poor Dentition, Missing   Pulmonary shortness of breath and with exertion, neg sleep apnea, neg COPD, former smoker,    breath sounds clear to auscultation- rhonchi (-) wheezing      Cardiovascular Exercise Tolerance: Poor + CAD, + Past MI, + Cardiac Stents (2018) and + Peripheral Vascular Disease (AAA)  + Valvular Problems/Murmurs (severe AS) AS  Rhythm:Regular Rate:Normal - Systolic murmurs and - Diastolic murmurs    Neuro/Psych neg Seizures negative neurological ROS  negative psych ROS   GI/Hepatic negative GI ROS, Neg liver ROS,   Endo/Other  negative endocrine ROSneg diabetes  Renal/GU negative Renal ROS     Musculoskeletal negative musculoskeletal ROS (+)   Abdominal (+) + obese,   Peds  Hematology negative hematology ROS (+)   Anesthesia Other Findings Past Medical History: No date: Aortic stenosis     Comment:  a. LHC 06/02/17: At least moderate aortic stenosis with               a peak to peak gradient of 22 mmHg; b. TTE 05/2017: EF               55-60%, mild HK basal-midinferior wall, Gr1DD, mod to sev              AS w/ mean gradient 21 mmHg, valve area 0.99, mild MR,               mildly dilated LA  2018: CAD (coronary artery disease)     Comment:  a. inferior STEMI 06/02/2017: LHC 06/02/17: LM 20, D1               20%, o-pLCx 50, p-mRCA 100% s/p PCI/DES, mRCA 50, dRCA 30 No date: GI bleed     Comment:  a. noted 06/05/2017 No date: HLD (hyperlipidemia) No date: Obesity   Reproductive/Obstetrics                              Anesthesia Physical  Anesthesia Plan  ASA: 4  Anesthesia Plan: General   Post-op Pain Management:    Induction: Intravenous  PONV Risk Score and Plan: 2 and Ondansetron and Dexamethasone  Airway Management Planned: Oral ETT  Additional Equipment:   Intra-op Plan:   Post-operative Plan: Extubation in OR and Possible Post-op intubation/ventilation  Informed Consent: I have reviewed the patients History and Physical, chart, labs and discussed the procedure including the risks, benefits and alternatives for the proposed anesthesia with the patient or authorized representative who has indicated his/her understanding and acceptance.     Dental advisory given  Plan Discussed with: CRNA and Anesthesiologist  Anesthesia Plan Comments: (Discussed high risk with son for cardiac and pulmonary complications and death in setting of severe/critical AS and unrepaired AAA,  Son understands and agrees with the plan of very likely keeping the patient intubated overnight.)       Anesthesia Quick Evaluation

## 2021-01-24 NOTE — Transfer of Care (Signed)
Immediate Anesthesia Transfer of Care Note  Patient: Monica [name]  Procedure(s) Performed: ENDOVASCULAR REPAIR/STENT GRAFT  Patient Location: ICU  Anesthesia Type:General  Level of Consciousness: sedated and Patient remains intubated per anesthesia plan  Airway & Oxygen Therapy: Patient remains intubated per anesthesia plan and Patient placed on Ventilator (see vital sign flow sheet for setting)  Post-op Assessment: Report given to RN and Post -op Vital signs reviewed and stable  Post vital signs: Reviewed and stable  Last Vitals:  Vitals Value Taken Time  BP 99/59 01/24/21 1452  Temp    Pulse 72 01/24/21 1458  Resp 17 01/24/21 1458  SpO2 100 % 01/24/21 1458  Vitals shown include unvalidated device data.  Last Pain:  Vitals:   01/24/21 0829  TempSrc: Oral  PainSc: 0-No pain         Complications: No notable events documented.

## 2021-01-24 NOTE — Anesthesia Procedure Notes (Signed)
Procedure Name: Intubation Date/Time: 01/24/2021 9:57 AM Performed by: Waunita Schooner, RN Pre-anesthesia Checklist: Patient identified, Emergency Drugs available, Suction available and Patient being monitored Patient Re-evaluated:Patient Re-evaluated prior to induction Oxygen Delivery Method: Circle system utilized Preoxygenation: Pre-oxygenation with 100% oxygen Induction Type: IV induction Ventilation: Mask ventilation without difficulty Laryngoscope Size: McGraph and 3 Grade View: Grade I Tube type: Oral Tube size: 7.0 mm Number of attempts: 1 Airway Equipment and Method: Stylet and Oral airway Placement Confirmation: ETT inserted through vocal cords under direct vision, positive ETCO2 and breath sounds checked- equal and bilateral Secured at: 21 cm Tube secured with: Tape Dental Injury: Teeth and Oropharynx as per pre-operative assessment

## 2021-01-24 NOTE — Progress Notes (Signed)
PROGRESS NOTE    Monica [name]  UJW:[id number] DOB: 1933-09-21 DOA: 01/15/2021 PCP: Patient, No Pcp Per (Inactive)   Brief Narrative:  Mrs. Monica [name] is a 87 y.o. F with CAD s/p PCI x1 in 2018, severe AS being worked up for TAVR, Obesity, pHTN, and COPD who presented with hip pain.   Had hip fracture after fall in Feb, repaired and discharged home.  Since being home 4 months ago, really never able to walk due to pain. Finally, in the last week before admission, developed redness and worse pain at left hip surgical site, then wound dehisced.   Presented to the ER and Orthopedics were consulted for suspected post-surgical hip infection with MRSA.  ID is recommending 4 weeks of vancomycin and rifampin.    Also noted incidentally to have very large AAA, she was taken to the OR for endovascular repair with vascular surgery today.  Also had severe aortic stenosis and plan is for TAVR work-up after AAA repair.  Patient with multiple life-threatening comorbidities-high risk for deterioration and death, palliative care was also consulted.  Subjective: Patient was seen after the procedure today.  Remained intubated, although wide-awake.  Nodding no for any pain.  Appears comfortable.  Assessment & Plan:   Active Problems:   Abscess of left hip   Abdominal aortic aneurysm (AAA) without rupture (HCC)   Obesity, Class III, BMI 40-49.9 (morbid obesity) (HCC)   Pre-op evaluation   Shortness of breath  Postsurgical hip infection S/p debridement of the left hip wound by Dr. Allena KatzPatel, 6/7 Admitted and taken to OR 6/7 for washout.  Intraoperative cultures growing MRSA in 3/3. ID consulted, recommended treating with 4 weeks antibiotics for presumed MRSA prosthetic infection.  1/2 blood cultures positive for staph epidermidis which was thought to be a contaminant. 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  Patient was evaluated  by vascular surgery, who recommended urgent repair given the size.  She is not a candidate for open repair.  She was taken to the OR today for endovascular repair-tolerated the procedure well. -Appreciate vascular help.  Severe aortic stenosis Previously known, since hip fracture in Feb.  Was pending TAVR eval prior to this admission, seen by Cardiology here.  -Plan for expedited TAVR work up after AAA repair    Pulmonary hypertension due to AS, CHF   Hypotension due to aortic stenosis -Continue midodrine   Chronic diastolic CHF -Defer diuretic to Cardiology  Acute on chronic hypoxic respiratory failure   Has chronic hypoxic respiratory failure due to aortic stenosis, heart failure, and pulmonary hypertension.  COPD is in chart but appears to be mild. Former smoker.  Uses 2 L of oxygen at baseline.  Initially requiring 2 to 3 L and then got worse requiring up to 10 L starting from 6/12. Chest x-ray unremarkable, CT angiogram of the chest with patchy subpleural fibrosis, no PE, moderate atelectasis, no edema or large consolidation, no effusion. - Consult to pulmonogy - Continue Solumedrol BID for NSIP    Coronary artery disease, secondary prevention - Continue Crestor   Iron deficiency anemia Hgb stable Given Feraheme 6/10 -Transfusion threshold 8 g/dL  Hyponatremia Mild, asymptomatic  Vaginal candidiasis Given Fluconazole 1 dose on 6/10, and foley placed for 48 hours, resolved, foley removed.   Left renal nodule Incidental finding, minor, given gravity of AAA and AS and hypoxia, this likely has not been discussed with patient and should be followed up  only depending on goals of care after AAA repair - Follow-up MRI as an outpatient  Class II obesity. Estimated body mass index is 39.11 kg/m as calculated from the following:   Height as of this encounter: 5\' 5"  (1.651 m).   Weight as of this encounter: 106.6 kg.    Objective: Vitals:   01/24/21 0026 01/24/21 0548  01/24/21 0754 01/24/21 0829  BP: 137/86 112/71 113/64 118/64  Pulse: 84 86 87 84  Resp: 20 20 16 16   Temp: 98.9 F (37.2 C) 98.4 F (36.9 C) 98.7 F (37.1 C) 98.7 F (37.1 C)  TempSrc: Oral Oral Oral Oral  SpO2: 98% 96% 93% 99%  Weight:      Height:        Intake/Output Summary (Last 24 hours) at 01/24/2021 1513 Last data filed at 01/24/2021 1419 Gross per 24 hour  Intake 1100 ml  Output 1000 ml  Net 100 ml   Filed Weights   01/15/21 1638  Weight: 106.6 kg    Examination:  General exam: Appears calm and comfortable, intubated Respiratory system: Clear to auscultation. Respiratory effort normal. Cardiovascular system: S1 & S2 heard, RRR. No JVD, murmurs, rubs, gallops or clicks. Gastrointestinal system: Soft, nontender, nondistended, bowel sounds positive. Central nervous system: Alert. No focal neurological deficits. Extremities: No edema, no cyanosis, pulses intact and symmetrical.   DVT prophylaxis: Lovenox Code Status: DNR Family Communication:  Disposition Plan:  Status is: Inpatient  Remains inpatient appropriate because:Inpatient level of care appropriate due to severity of illness  Dispo: The patient is from:  Home              Anticipated d/c is to:  To be determined              Patient currently is not medically stable to d/c.   Difficult to place patient No                Level of care: ICU  All the records are reviewed and case discussed with Care Management/Social Worker. Management plans discussed with the patient, nursing and they are in agreement.  Consultants:  Vascular surgery Pulmonology Cardiology  Procedures:  Antimicrobials:   Data Reviewed: I have personally reviewed following labs and imaging studies  CBC: Recent Labs  Lab 01/20/21 0132 01/21/21 0451 01/22/21 0452 01/23/21 0426 01/24/21 0414  WBC 9.5 5.8 6.6 5.7 4.2  HGB 13.4 11.7* 12.0 12.9 12.9  HCT 45.1 37.4 38.7 40.2 41.8  MCV 79.7* 75.7* 76.0* 74.2* 76.8*  PLT 226  298 296 287 251   Basic Metabolic Panel: Recent Labs  Lab 01/20/21 0132 01/21/21 0451 01/22/21 0452 01/23/21 0426 01/24/21 0414  NA 134* 134* 135 134* 133*  K 4.4 3.3* 3.9 3.5 3.2*  CL 97* 94* 93* 89* 90*  CO2 28 32 35* 33* 33*  GLUCOSE 104* 95 103* 113* 92  BUN 23 19 19 22  25*  CREATININE 0.82 0.73 0.74 0.77 0.74  CALCIUM 9.1 8.7* 8.6* 8.8* 8.6*   GFR: Estimated Creatinine Clearance: 62.3 mL/min (by C-G formula based on SCr of 0.74 mg/dL). Liver Function Tests: Recent Labs  Lab 01/23/21 0426  AST 26  ALT 11  ALKPHOS 60  BILITOT 1.9*  PROT 7.6  ALBUMIN 3.0*   No results for input(s): LIPASE, AMYLASE in the last 168 hours. No results for input(s): AMMONIA in the last 168 hours. Coagulation Profile: No results for input(s): INR, PROTIME in the last 168 hours. Cardiac Enzymes: No results for input(s):  CKTOTAL, CKMB, CKMBINDEX, TROPONINI in the last 168 hours. BNP (last 3 results) No results for input(s): PROBNP in the last 8760 hours. HbA1C: No results for input(s): HGBA1C in the last 72 hours. CBG: No results for input(s): GLUCAP in the last 168 hours. Lipid Profile: No results for input(s): CHOL, HDL, LDLCALC, TRIG, CHOLHDL, LDLDIRECT in the last 72 hours. Thyroid Function Tests: No results for input(s): TSH, T4TOTAL, FREET4, T3FREE, THYROIDAB in the last 72 hours. Anemia Panel: No results for input(s): VITAMINB12, FOLATE, FERRITIN, TIBC, IRON, RETICCTPCT in the last 72 hours. Sepsis Labs: No results for input(s): PROCALCITON, LATICACIDVEN in the last 168 hours.  Recent Results (from the past 240 hour(s))  Blood culture (routine x 2)     Status: Abnormal   Collection Time: 01/15/21  4:43 PM   Specimen: BLOOD LEFT HAND  Result Value Ref Range Status   Specimen Description   Final    BLOOD LEFT HAND Performed at [facility], [address] [place], [place] [address]    Special Requests   Final    BOTTLES DRAWN AEROBIC AND ANAEROBIC Blood Culture  adequate volume Performed at [facility], [address] [place], [place] [address]    Culture  Setup Time   Final    Organism ID to follow GRAM POSITIVE COCCI AEROBIC BOTTLE ONLY CRITICAL RESULT CALLED TO, READ BACK BY AND VERIFIED WITH: AMY THOMPSON AT 0981 ON 01/16/2021 [facility]. Performed at [facility] [address] [place], [place] [address]    Culture (A)  Final    STAPHYLOCOCCUS EPIDERMIDIS THE SIGNIFICANCE OF ISOLATING THIS ORGANISM FROM A SINGLE SET OF BLOOD CULTURES WHEN MULTIPLE SETS ARE DRAWN IS UNCERTAIN. PLEASE NOTIFY THE MICROBIOLOGY DEPARTMENT WITHIN ONE WEEK IF SPECIATION AND SENSITIVITIES ARE REQUIRED. Performed at [facility], [address] [place], [place] [address]    Report Status 01/18/2021 FINAL  Final  Blood culture (routine x 2)     Status: None   Collection Time: 01/15/21  4:43 PM   Specimen: BLOOD  Result Value Ref Range Status   Specimen Description BLOOD BLOOD RIGHT WRIST  Final   Special Requests   Final    BOTTLES DRAWN AEROBIC AND ANAEROBIC Blood Culture results may not be optimal due to an inadequate volume of blood received in culture bottles   Culture   Final    NO GROWTH 5 DAYS Performed at [facility], [address] [place], [place] [address]    Report Status 01/20/2021 FINAL  Final  Blood Culture ID Panel (Reflexed)     Status: Abnormal   Collection Time: 01/15/21  4:43 PM  Result Value Ref Range Status   Enterococcus faecalis NOT DETECTED NOT DETECTED Final   Enterococcus Faecium NOT DETECTED NOT DETECTED Final   Listeria monocytogenes NOT DETECTED NOT DETECTED Final   Staphylococcus species DETECTED (A) NOT DETECTED Final    Comment: CRITICAL RESULT CALLED TO, READ BACK BY AND VERIFIED WITH: AMY THOMPSON AT 6578 ON 01/16/2021 [facility].    Staphylococcus aureus (BCID) NOT DETECTED NOT DETECTED Final   Staphylococcus epidermidis DETECTED (A) NOT DETECTED Final    Comment: Methicillin (oxacillin)  resistant coagulase negative staphylococcus. Possible blood culture contaminant (unless isolated from more than one blood culture draw or clinical case suggests pathogenicity). No antibiotic treatment is indicated for blood  culture contaminants. CRITICAL RESULT CALLED TO, READ BACK BY AND VERIFIED WITH: AMY THOMPSON AT 4696 ON 01/16/2021 [facility].    Staphylococcus lugdunensis NOT DETECTED  NOT DETECTED Final   Streptococcus species NOT DETECTED NOT DETECTED Final   Streptococcus agalactiae NOT DETECTED NOT DETECTED Final   Streptococcus pneumoniae NOT DETECTED NOT DETECTED Final   Streptococcus pyogenes NOT DETECTED NOT DETECTED Final   A.calcoaceticus-baumannii NOT DETECTED NOT DETECTED Final   Bacteroides fragilis NOT DETECTED NOT DETECTED Final   Enterobacterales NOT DETECTED NOT DETECTED Final   Enterobacter cloacae complex NOT DETECTED NOT DETECTED Final   Escherichia coli NOT DETECTED NOT DETECTED Final   Klebsiella aerogenes NOT DETECTED NOT DETECTED Final   Klebsiella oxytoca NOT DETECTED NOT DETECTED Final   Klebsiella pneumoniae NOT DETECTED NOT DETECTED Final   Proteus species NOT DETECTED NOT DETECTED Final   Salmonella species NOT DETECTED NOT DETECTED Final   Serratia marcescens NOT DETECTED NOT DETECTED Final   Haemophilus influenzae NOT DETECTED NOT DETECTED Final   Neisseria meningitidis NOT DETECTED NOT DETECTED Final   Pseudomonas aeruginosa NOT DETECTED NOT DETECTED Final   Stenotrophomonas maltophilia NOT DETECTED NOT DETECTED Final   Candida albicans NOT DETECTED NOT DETECTED Final   Candida auris NOT DETECTED NOT DETECTED Final   Candida glabrata NOT DETECTED NOT DETECTED Final   Candida krusei NOT DETECTED NOT DETECTED Final   Candida parapsilosis NOT DETECTED NOT DETECTED Final   Candida tropicalis NOT DETECTED NOT DETECTED Final   Cryptococcus neoformans/gattii NOT DETECTED NOT DETECTED Final   Methicillin resistance mecA/C DETECTED (A) NOT DETECTED Final     Comment: CRITICAL RESULT CALLED TO, READ BACK BY AND VERIFIED WITH: AMY THOMPSON AT 4827 ON 01/16/2021 [facility]. Performed at [facility], [address] [place], [place] [address]   Resp Panel by RT-PCR (Flu A&B, Covid) Nasopharyngeal Swab     Status: None   Collection Time: 01/15/21  8:24 PM   Specimen: Nasopharyngeal Swab; Nasopharyngeal(NP) swabs in vial transport medium  Result Value Ref Range Status   SARS Coronavirus 2 by RT PCR NEGATIVE NEGATIVE Final    Comment: (NOTE) SARS-CoV-2 target nucleic acids are NOT DETECTED.  The SARS-CoV-2 RNA is generally detectable in upper respiratory specimens during the acute phase of infection. The lowest concentration of SARS-CoV-2 viral copies this assay can detect is 138 copies/mL. A negative result does not preclude SARS-Cov-2 infection and should not be used as the sole basis for treatment or other patient management decisions. A negative result may occur with  improper specimen collection/handling, submission of specimen other than nasopharyngeal swab, presence of viral mutation(s) within the areas targeted by this assay, and inadequate number of viral copies(<138 copies/mL). A negative result must be combined with clinical observations, patient history, and epidemiological information. The expected result is Negative.  Fact Sheet for Patients:  BloggerCourse.com  Fact Sheet for Healthcare Providers:  SeriousBroker.it  This test is no t yet approved or cleared by the [place] FDA and  has been authorized for detection and/or diagnosis of SARS-CoV-2 by FDA under an Emergency Use Authorization (EUA). This EUA will remain  in effect (meaning this test can be used) for the duration of the COVID-19 declaration under Section 564(b)(1) of the Act, 21 U.S.C.section 360bbb-3(b)(1), unless the authorization is terminated  or revoked sooner.       Influenza A by PCR NEGATIVE  NEGATIVE Final   Influenza B by PCR NEGATIVE NEGATIVE Final    Comment: (NOTE) The Xpert Xpress SARS-CoV-2/FLU/RSV plus assay is intended as an aid in the diagnosis of influenza from Nasopharyngeal swab specimens and should not be used as a sole basis for treatment.  Nasal washings and aspirates are unacceptable for Xpert Xpress SARS-CoV-2/FLU/RSV testing.  Fact Sheet for Patients: BloggerCourse.com  Fact Sheet for Healthcare Providers: SeriousBroker.it  This test is not yet approved or cleared by the [place] FDA and has been authorized for detection and/or diagnosis of SARS-CoV-2 by FDA under an Emergency Use Authorization (EUA). This EUA will remain in effect (meaning this test can be used) for the duration of the COVID-19 declaration under Section 564(b)(1) of the Act, 21 U.S.C. section 360bbb-3(b)(1), unless the authorization is terminated or revoked.  Performed at [facility], [address] [place], [place] [address]   Aerobic/Anaerobic Culture w Gram Stain (surgical/deep wound)     Status: None   Collection Time: 01/16/21  2:25 PM   Specimen: Wound; Abscess  Result Value Ref Range Status   Specimen Description   Final    WOUND Performed at [facility], [address] [place], [place] [address]    Special Requests   Final    LEFT HIP Performed at [facility], [address] [place], [place] [address]    Gram Stain   Final    RARE WBC PRESENT,BOTH PMN AND MONONUCLEAR NO ORGANISMS SEEN    Culture   Final    RARE METHICILLIN RESISTANT STAPHYLOCOCCUS AUREUS NO ANAEROBES ISOLATED Performed at [facility], [address] [place], [place] [address]    Report Status 01/21/2021 FINAL  Final   Organism ID, Bacteria METHICILLIN RESISTANT STAPHYLOCOCCUS AUREUS  Final      Susceptibility   Methicillin resistant staphylococcus aureus - MIC*    CIPROFLOXACIN 4 RESISTANT  Resistant     ERYTHROMYCIN >=8 RESISTANT Resistant     GENTAMICIN <=0.5 SENSITIVE Sensitive     OXACILLIN >=4 RESISTANT Resistant     TETRACYCLINE <=1 SENSITIVE Sensitive     VANCOMYCIN <=0.5 SENSITIVE Sensitive     TRIMETH/SULFA <=10 SENSITIVE Sensitive     CLINDAMYCIN <=0.25 SENSITIVE Sensitive     RIFAMPIN <=0.5 SENSITIVE Sensitive     Inducible Clindamycin NEGATIVE Sensitive     * RARE METHICILLIN RESISTANT STAPHYLOCOCCUS AUREUS  Aerobic/Anaerobic Culture w Gram Stain (surgical/deep wound)     Status: None   Collection Time: 01/16/21  2:25 PM   Specimen: Wound; Abscess  Result Value Ref Range Status   Specimen Description   Final    WOUND Performed at [facility], [address] [place], [place] [address]    Special Requests LEFT HIP  Final   Gram Stain   Final    FEW WBC PRESENT, PREDOMINANTLY PMN NO ORGANISMS SEEN    Culture   Final    RARE METHICILLIN RESISTANT STAPHYLOCOCCUS AUREUS NO ANAEROBES ISOLATED Performed at [facility], [address], [place], [place] [address]    Report Status 01/21/2021 FINAL  Final   Organism ID, Bacteria METHICILLIN RESISTANT STAPHYLOCOCCUS AUREUS  Final      Susceptibility   Methicillin resistant staphylococcus aureus - MIC*    CIPROFLOXACIN 4 RESISTANT Resistant     ERYTHROMYCIN >=8 RESISTANT Resistant     GENTAMICIN <=0.5 SENSITIVE Sensitive     OXACILLIN >=4 RESISTANT Resistant     TETRACYCLINE <=1 SENSITIVE Sensitive     VANCOMYCIN <=0.5 SENSITIVE Sensitive     TRIMETH/SULFA <=10 SENSITIVE Sensitive     CLINDAMYCIN <=0.25 SENSITIVE Sensitive     RIFAMPIN <=0.5 SENSITIVE Sensitive     Inducible Clindamycin NEGATIVE Sensitive     * RARE METHICILLIN RESISTANT STAPHYLOCOCCUS AUREUS  Aerobic/Anaerobic Culture w Gram Stain (surgical/deep wound)     Status: None   Collection Time: 01/16/21  2:26 PM   Specimen: Wound; Abscess  Result Value Ref Range Status   Specimen Description   Final    WOUND Performed at  [facility], [address], [place], [place] [address]    Special Requests   Final    LEFT HIP Performed at [facility], [address] [place], [place] [address]    Gram Stain NO WBC SEEN NO ORGANISMS SEEN   Final   Culture   Final    RARE METHICILLIN RESISTANT STAPHYLOCOCCUS AUREUS NO ANAEROBES ISOLATED Performed at [facility], [address] [place], [place] [address]    Report Status 01/21/2021 FINAL  Final   Organism ID, Bacteria METHICILLIN RESISTANT STAPHYLOCOCCUS AUREUS  Final      Susceptibility   Methicillin resistant staphylococcus aureus - MIC*    CIPROFLOXACIN 4 RESISTANT Resistant     ERYTHROMYCIN >=8 RESISTANT Resistant     GENTAMICIN <=0.5 SENSITIVE Sensitive     OXACILLIN >=4 RESISTANT Resistant     TETRACYCLINE <=1 SENSITIVE Sensitive     VANCOMYCIN 1 SENSITIVE Sensitive     TRIMETH/SULFA <=10 SENSITIVE Sensitive     CLINDAMYCIN <=0.25 SENSITIVE Sensitive     RIFAMPIN <=0.5 SENSITIVE Sensitive     Inducible Clindamycin NEGATIVE Sensitive     * RARE METHICILLIN RESISTANT STAPHYLOCOCCUS AUREUS  CULTURE, BLOOD (ROUTINE X 2) w Reflex to ID Panel     Status: None   Collection Time: 01/17/21 12:10 PM   Specimen: BLOOD  Result Value Ref Range Status   Specimen Description BLOOD BLOOD RIGHT HAND  Final   Special Requests   Final    BOTTLES DRAWN AEROBIC AND ANAEROBIC Blood Culture results may not be optimal due to an excessive volume of blood received in culture bottles   Culture   Final    NO GROWTH 5 DAYS Performed at [facility], [address] [place], [place] [address]    Report Status 01/22/2021 FINAL  Final  CULTURE, BLOOD (ROUTINE X 2) w Reflex to ID Panel     Status: None   Collection Time: 01/17/21 12:13 PM   Specimen: BLOOD LEFT HAND  Result Value Ref Range Status   Specimen Description BLOOD LEFT HAND  Final   Special Requests   Final    BOTTLES DRAWN AEROBIC AND ANAEROBIC Blood Culture adequate volume    Culture   Final    NO GROWTH 5 DAYS Performed at [facility], [address] [place], [place] [address]    Report Status 01/22/2021 FINAL  Final     Radiology Studies: PERIPHERAL VASCULAR CATHETERIZATION  Result Date: 01/24/2021 See surgical note for result.   Scheduled Meds:  Chlorhexidine Gluconate Cloth  6 each Topical Daily   docusate  100 mg Per Tube BID   enoxaparin (LOVENOX) injection  40 mg Subcutaneous Q24H   feeding supplement  237 mL Oral BID BM   ipratropium-albuterol  3 mL Nebulization Q6H   methylPREDNISolone (SOLU-MEDROL) injection  60 mg Intravenous Q12H   midodrine  2.5 mg Oral TID WC   multivitamin with minerals  1 tablet Oral Daily   pantoprazole (PROTONIX) IV  40 mg Intravenous Daily   polyethylene glycol  17 g Per Tube Daily   rifampin  300 mg Oral BID WC   rosuvastatin  10 mg Oral Daily   Continuous Infusions:  fentaNYL infusion INTRAVENOUS  methocarbamol (ROBAXIN) IV     potassium chloride     promethazine (PHENERGAN) injection (IM or IVPB)     vancomycin 1,250 mg (01/23/21 2155)     LOS: 9 days   Time spent: 40 minutes More than 50% of the time was spent in counseling/coordination of care  Arnetha Courser, MD [facility]  If 7PM-7AM, please contact night-coverage [email or web address]  01/24/2021, 3:13 PM   This record has been created using Conservation officer, historic buildings. Errors have been sought and corrected,but may not always be located. Such creation errors do not reflect on the standard of care.

## 2021-01-24 NOTE — Care Management Important Message (Signed)
Important Message  Patient Details  Name: Monica [name] MRN: [id number] Date of Birth: 11-Dec-1933   Medicare Important Message Given:  Other (see comment)  Patient down for a procedure so unable to reach by phone [phone]). Will try again.  [name] A Chanoch Mccleery 01/24/2021, 2:28 PM

## 2021-01-24 NOTE — Op Note (Signed)
OPERATIVE NOTE   PROCEDURE: US guidance for vascular access, bilateral femoral arteries Catheter placement into aorta from bilateral femoral approaches Open exposure left brachial artery.   Introduction catheter into the abdominal aorta from the left brachial approach with selective injection of the left renal artery.   Angioplasty and stent placement left renal artery for the snorkel technique.   Placement of a 32 x 14 x 14 Gore Excluder Endoprosthesis main body with a 12 x 14 ipsilateral extender and a 20 x 14 contralateral limb. Placement of a 32 proximal extender cuff x2 Placement of a 6 mm x 75 mm Viabahn right renal artery for a periscope technique. Coil embolization of the right internal iliac artery ProGlide closure devices bilateral femoral arteries  PRE-OPERATIVE DIAGNOSIS: AAA  POST-OPERATIVE DIAGNOSIS: same  SURGEON: Levora Dredge, MD and Festus Barren, MD - Co-surgeons  Assistant: Louisa Second, MD  ANESTHESIA: general  ESTIMATED BLOOD LOSS: 100 cc  Contrast:  110 cc  Fluoroscopy time: 62.5 minutes  FINDING(S): 1.  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 y.o. female who presents with the incidental finding of an 8.2 cm abdominal aortic aneurysm.  This is a pararenal aneurysm but appears to be amenable to using ancillary techniques such as snorkeling and periscope in the renal arteries to allow for endovascular repair.  She has been evaluated by her cardiologist and is not an open surgical candidate.  The risks and benefits of been reviewed in detail with the patient as well as her family all questions unanswered patient wishes to proceed with endovascular repair of her abdominal aortic aneurysm.  DESCRIPTION: After obtaining full informed written consent, the patient was brought back to the operating room and placed supine upon the operating table.  The patient received IV antibiotics prior to  induction.  After obtaining adequate anesthesia, the patient was prepped and draped in the standard fashion for endovascular AAA repair.  Co-surgeons are required because this is a complex bilateral procedure with work being performed simultaneously from both the right femoral and left femoral approach.  This also expedites the procedure making a shorter operative time reducing complications and improving patient safety.  We then began by gaining access to both femoral arteries with US guidance with me working on the patient's right and Dr. Wyn Quaker working on the patient's left.  The femoral arteries were found to be patent and accessed without difficulty with a needle under ultrasound guidance without difficulty on each side and permanent images were recorded.  We then placed 2 proglide devices on each side in a pre-close fashion and placed 8 French sheaths.  Simultaneous with femoral access a cutdown was made overlying the left brachial artery and the dissection carried through the soft tissues to expose the brachial artery.  Brachial artery was looped proximally distally with Silastic Vesseloops.  Using a micropuncture needle access was obtained brachial artery directly.  Microwire followed micro sheath J-wire followed by a 6 French sheath.  The patient was then given 5000 units of intravenous heparin.  It should be noted that to additional 2000 unit boluses of heparin were given throughout the procedure for a total of 9000 units of heparin.  Working from the right groin with several different wires and catheters I then gained access to the right internal iliac artery.  Ultimately, the successful combination was a stiff angled Glidewire in association with a rim catheter.  Wire was removed  and a prograde catheter was advanced and hand-injection contrast was used to demonstrate the anatomy of the internal iliac and optimize plan for coiling.  True lumen was identified by this hand-injection as well.  3 Ruby  coils were then deployed a 16 x 16 followed by a 16 x 50 soft coil and then a 30 cm packing coil.  Follow-up imaging demonstrated only trace flow through the internal at this point.  And we elected to move forward with the procedure.  The Pigtail catheter was placed into the aorta from the right side. Using this image, we selected a 32 x 14 x 14 Main body device.  Over a stiff wire, an 66 French sheath was placed. The main body was then placed through the 18 French sheath. A Kumpe catheter was placed up the left side and a magnified image at the renal arteries was performed.   Next working from the brachial approach the 6 [place] Pinnacle sheath was exchanged for a 90 cm destination sheath after negotiating a advantage wire and Kumpe catheter into the descending thoracic aorta.  Next using a combination of different wires and catheters the left renal artery was selected hand-injection through the C2 catheter was used to verify true lumen and appropriate anatomy and then the sheath was advanced over the wire combination.  Glidewire was exchanged for a Magic torque wire.  With the sheath sitting in the proximal left renal artery a 6 mm x 37 mm lifestream stent was advanced so that it was seated into the left renal artery by approximately 10 to 15 mm.  The main body was then advanced into position.  The main body was then deployed just below the lowest renal artery.  Next the left renal artery stent was deployed.  We then repeated the contrast injection through the left side which demonstrated that the stent graft was slightly low and it was read constrained and advanced again oblique imaging was then used to highlight the origin of the right renal artery and the stent graft was redeployed landing it within 1 mm of the inferior margin of the right renal artery.  Coda balloon was then advanced up the right side and alternating inflations of the Coda balloon with the renal stent balloon were performed to ironed out  the wall of the graft.  The Kumpe catheter was used to cannulate the contralateral gate without difficulty and successful cannulation was confirmed by twirling the pigtail catheter in the main body. We then placed a stiff wire and a retrograde arteriogram was performed through the left femoral sheath. We upsized to the 12 [place] sheath for the contralateral limb and a 20 x 14 limb was selected and deployed. The main body deployment was then completed.  We then extended the right limb into the right external iliac artery using a 12 x 14 extender limb.    All junction points and seals zones were treated with the [employer] compliant balloon from the right and left femoral approaches.  The pigtail catheter was then advanced up the right side and bolus injection of contrast was performed.  Unfortunately a significant type I endoleak was identified and after retreatment of the leading edge of the stent there was no improvement and therefore a 32 proximal extender cuff was advanced up the right side with deployment it slid down into the main body of the graft because of this we elected to periscope the right renal artery in order to achieve better seal.   Working from  the left femoral approach Dr. Wyn Quaker was able to cannulate the right renal artery.  Hand-injection contrast verified intraluminal positioning and an 018 Platinum Plus wire was then exchanged through the catheter.  A 6 mm x 75 mm Viabahn was then positioned extending from the renal artery down.  A second 32 proximal extender cuff was then advanced up and we transition to a near lateral approach.  Injection contrast then demonstrated the origin of the SMA and the proximal extender was positioned just below the SMA.  He was then deployed without difficulty and then the Viabahn was deployed.  The Viabahn was then postdilated with a 5 mm balloon and the Coda balloon was once again delivered up the right side and used to treat the proximal stent graft.  Simultaneous  inflations were performed with both the right renal artery stent as well as the left renal artery stent including the Coda balloon in the main body.  The pigtail catheter was then replaced and a completion angiogram was performed.   A faint gutter endoleak was detected on completion angiography.  This was markedly improved compared to the initial imaging.  The renal arteries were found to be widely patent.  Distally there was excellent seal.  Given the faintness of the leak and the rather dramatic improvement over the initial image as well as taking into account her overall comorbidities I feel that it is appropriate to end the procedure at this point and follow her with follow-up duplex ultrasound and CT scan.  I have confidence that once she is off her anticoagulation that this gutter leak will thrombose.  At this point we elected to terminate the procedure. We secured the pro glide devices for hemostasis on the femoral arteries. The skin incision was closed with a 4-0 Monocryl. Dermabond and pressure dressing were placed. The patient was taken to the recovery room in stable condition having tolerated the procedure well.  The brachial artery was then repaired with interrupted 6-0 Prolene sutures and the exposure was then irrigated and closed in layers using 3-0 Vicryl followed by 4-0 Monocryl subcuticular  COMPLICATIONS: none  CONDITION: stable  Levora Dredge  01/24/2021, 2:23 PM

## 2021-01-25 ENCOUNTER — Encounter: Payer: Self-pay | Admitting: Vascular Surgery

## 2021-01-25 DIAGNOSIS — I060 Rheumatic aortic stenosis: Secondary | ICD-10-CM

## 2021-01-25 DIAGNOSIS — I272 Pulmonary hypertension, unspecified: Secondary | ICD-10-CM

## 2021-01-25 DIAGNOSIS — Z7189 Other specified counseling: Secondary | ICD-10-CM | POA: Diagnosis not present

## 2021-01-25 LAB — BLOOD GAS, ARTERIAL
Acid-Base Excess: 9.1 mmol/L — ABNORMAL HIGH (ref 0.0–2.0)
Bicarbonate: 33.5 mmol/L — ABNORMAL HIGH (ref 20.0–28.0)
FIO2: 0.45
O2 Saturation: 97.9 %
PEEP: 5 cmH2O
Patient temperature: 37
Pressure support: 5 cmH2O
pCO2 arterial: 44 mmHg (ref 32.0–48.0)
pH, Arterial: 7.49 — ABNORMAL HIGH (ref 7.350–7.450)
pO2, Arterial: 95 mmHg (ref 83.0–108.0)

## 2021-01-25 LAB — ENA+DNA/DS+SJORGEN'S
ENA SM Ab Ser-aCnc: <0.2 AI (ref 0.0–0.9)
Ribonucleic Protein: 0.6 AI (ref 0.0–0.9)
SSA (Ro) (ENA) Antibody, IgG: <0.2 AI (ref 0.0–0.9)
SSB (La) (ENA) Antibody, IgG: <0.2 AI (ref 0.0–0.9)
ds DNA Ab: 2 IU/mL (ref 0–9)

## 2021-01-25 LAB — CBC
HCT: 37.7 % (ref 36.0–46.0)
Hemoglobin: 11.6 g/dL — ABNORMAL LOW (ref 12.0–15.0)
MCH: 23.7 pg — ABNORMAL LOW (ref 26.0–34.0)
MCHC: 30.8 g/dL (ref 30.0–36.0)
MCV: 77.1 fL — ABNORMAL LOW (ref 80.0–100.0)
Platelets: 180 10*3/uL (ref 150–400)
RBC: 4.89 MIL/uL (ref 3.87–5.11)
RDW: 19.4 % — ABNORMAL HIGH (ref 11.5–15.5)
WBC: 7.4 10*3/uL (ref 4.0–10.5)
nRBC: 0 % (ref 0.0–0.2)

## 2021-01-25 LAB — BASIC METABOLIC PANEL
Anion gap: 9 (ref 5–15)
BUN: 29 mg/dL — ABNORMAL HIGH (ref 8–23)
CO2: 31 mmol/L (ref 22–32)
Calcium: 8.5 mg/dL — ABNORMAL LOW (ref 8.9–10.3)
Chloride: 95 mmol/L — ABNORMAL LOW (ref 98–111)
Creatinine, Ser: 0.93 mg/dL (ref 0.44–1.00)
GFR, Estimated: >60 mL/min (ref >60)
Glucose, Bld: 121 mg/dL — ABNORMAL HIGH (ref 70–99)
Potassium: 4.6 mmol/L (ref 3.5–5.1)
Sodium: 135 mmol/L (ref 135–145)

## 2021-01-25 LAB — ANA W/REFLEX: Anti Nuclear Antibody (ANA): POSITIVE — AB

## 2021-01-25 LAB — ANCA TITERS
Atypical P-ANCA titer: <1:20 {titer}
C-ANCA: <1:20 {titer}
P-ANCA: <1:20 {titer}

## 2021-01-25 LAB — CREATININE, SERUM
Creatinine, Ser: 1.02 mg/dL — ABNORMAL HIGH (ref 0.44–1.00)
GFR, Estimated: 54 mL/min — ABNORMAL LOW (ref >60)

## 2021-01-25 LAB — PREPARE RBC (CROSSMATCH)

## 2021-01-25 LAB — VANCOMYCIN, TROUGH: Vancomycin Tr: 21 ug/mL (ref 15–20)

## 2021-01-25 MED ORDER — DIAZEPAM 5 MG/ML IJ SOLN
INTRAMUSCULAR | Status: AC
  Filled 2021-01-25: qty 2

## 2021-01-25 MED ORDER — FUROSEMIDE 10 MG/ML IJ SOLN
40.0000 mg | Freq: Once | INTRAMUSCULAR | Status: AC
  Administered 2021-01-25: 40 mg via INTRAVENOUS
  Filled 2021-01-25: qty 4

## 2021-01-25 MED ORDER — VANCOMYCIN VARIABLE DOSE PER UNSTABLE RENAL FUNCTION (PHARMACIST DOSING): Status: DC

## 2021-01-25 MED ORDER — NOREPINEPHRINE 4 MG/250ML-% IV SOLN
2.0000 ug/min | INTRAVENOUS | Status: DC
  Administered 2021-01-25: 2 ug/min via INTRAVENOUS
  Filled 2021-01-25: qty 250

## 2021-01-25 MED ORDER — SODIUM CHLORIDE 0.9 % IV SOLN
250.0000 mL | INTRAVENOUS | Status: DC
  Administered 2021-01-25 – 2021-01-29 (×2): 250 mL via INTRAVENOUS

## 2021-01-25 MED ORDER — DIAZEPAM 5 MG/ML IJ SOLN
2.5000 mg | INTRAMUSCULAR | Status: DC | PRN
  Administered 2021-01-25 – 2021-02-05 (×12): 2.5 mg via INTRAVENOUS
  Filled 2021-01-25 (×11): qty 2

## 2021-01-25 MED ORDER — MORPHINE SULFATE (PF) 2 MG/ML IV SOLN
1.0000 mg | INTRAVENOUS | Status: DC | PRN
  Administered 2021-01-25 – 2021-02-01 (×17): 2 mg via INTRAVENOUS
  Administered 2021-02-02: 1 mg via INTRAVENOUS
  Administered 2021-02-02 – 2021-02-05 (×5): 2 mg via INTRAVENOUS
  Filled 2021-01-25 (×27): qty 1

## 2021-01-25 NOTE — Progress Notes (Addendum)
1530 Oxygen concentration up and down for the last hour. Carollee Herter RT and J.Keene NP in to talk with family about patient treatment. HFNC increased without good results. Placed on BiPAP . Patient adamate she does not want to be intubated or coded. After patient was placed on BiPaP she became anxious. Given Valium 2.5 mg per order. Second order was given for Morphine for air hunger but not needed at this time. Resting now.

## 2021-01-25 NOTE — Progress Notes (Signed)
Pt became hypotensive around 0230 this morning and required levophed gtt, she is current at and unable to wean off. Precedex and Fentanyl drips still on for sedation and versed was given once for agitation. No output collected from wound vac for the entire shift.

## 2021-01-25 NOTE — Progress Notes (Signed)
OT Cancellation Note  Patient Details Name: Paulita Licklider MRN: [id number] DOB: 1933-12-20   Cancelled Treatment:    Reason Eval/Treat Not Completed: Other (comment). Chart reviewed. Pt noted to have had AAA surgical repair on 6/15, transferred to ICU afterwards and remains on ventilator. OT order reviewed and note "discontinue at transfer" as of 01/24/21. Will complete current OT order. Please re-consult OT if/when medically appropriate.   Wynona Canes, MPH, MS, OTR/L ascom [phone] 01/25/21, 7:58 AM

## 2021-01-25 NOTE — Progress Notes (Signed)
PT Cancellation Note  Patient Details Name: Elyssa Pendelton MRN: [id number] DOB: 05-12-1934   Cancelled Treatment:    Reason Eval/Treat Not Completed: Other (comment). Chart reviewed. Pt noted to have had AAA surgical repair on 6/15, transferred to ICU afterwards and remains on ventilator. PT order reviewed and note "discontinue at transfer" as of 01/24/21. Will complete current PT order. Please re-consult PT if/when medically appropriate.   Olga Coaster PT, DPT 8:14 AM,01/25/21

## 2021-01-25 NOTE — Progress Notes (Signed)
Daily Progress Note   Patient Name: Monica [name]       Date: 01/25/2021 DOB: [birth year]  Age: 87 y.o. MRN#: [id number] Attending Physician: Arnetha Courser, MD Primary Care Physician: Patient, No Pcp Per (Inactive) Admit Date: 01/15/2021  Reason for Consultation/Follow-up: Establishing goals of care  Subjective: Patient remains intubated. No family at bedside. Will follow due to complexity and risk for decline.   Length of Stay: 10  Current Medications: Scheduled Meds:  . Chlorhexidine Gluconate Cloth  6 each Topical Daily  . docusate  100 mg Per Tube BID  . enoxaparin (LOVENOX) injection  40 mg Subcutaneous Q24H  . feeding supplement  237 mL Oral BID BM  . ipratropium-albuterol  3 mL Nebulization Q6H  . methylPREDNISolone (SOLU-MEDROL) injection  60 mg Intravenous Q12H  . midodrine  2.5 mg Oral TID WC  . multivitamin with minerals  1 tablet Oral Daily  . mupirocin ointment  1 application Nasal BID  . pantoprazole (PROTONIX) IV  40 mg Intravenous Daily  . polyethylene glycol  17 g Per Tube Daily  . rifampin  300 mg Oral BID WC  . rosuvastatin  10 mg Oral Daily    Continuous Infusions: . sodium chloride 10 mL/hr at 01/25/21 0345  . dexmedetomidine (PRECEDEX) IV infusion 0.4 mcg/kg/hr (01/25/21 0345)  . methocarbamol (ROBAXIN) IV    . norepinephrine (LEVOPHED) Adult infusion Stopped (01/25/21 0800)  . promethazine (PHENERGAN) injection (IM or IVPB)    . vancomycin Stopped (01/25/21 0029)    PRN Meds: bisacodyl, guaiFENesin-dextromethorphan, heparin 5000 units in normal saline injection (flush), ipratropium-albuterol, lip balm, methocarbamol **OR** methocarbamol (ROBAXIN) IV, metoCLOPramide **OR** metoCLOPramide (REGLAN) injection, nitroGLYCERIN, ondansetron **OR** ondansetron  (ZOFRAN) IV, promethazine **OR** promethazine (PHENERGAN) injection (IM or IVPB) **OR** promethazine, senna-docusate, sodium phosphate, Surgifoam 1 Gm with Thrombin 5,000 units (5 ml) topical solution, traZODone  Physical Exam Constitutional:      Comments: On ventilator.             Vital Signs: BP (!) 110/46   Pulse 67   Temp 98.7 F (37.1 C) (Oral)   Resp 19   Ht 5\' 5"  (1.651 m)   Wt 102 kg   SpO2 94%   BMI 37.42 kg/m  SpO2: SpO2: 94 % O2 Device: O2 Device: High Flow Nasal Cannula O2 Flow Rate: O2 Flow  Rate (L/min): 40 L/min  Intake/output summary:  Intake/Output Summary (Last 24 hours) at 01/25/2021 1510 Last data filed at 01/25/2021 0700 Gross per 24 hour  Intake 990.56 ml  Output 200 ml  Net 790.56 ml   LBM: Last BM Date: 01/23/21 Baseline Weight: Weight: 106.6 kg Most recent weight: Weight: 102 kg        Patient Active Problem List   Diagnosis Date Noted  . Hypoxia   . Shortness of breath   . Pre-op evaluation   . Abdominal aortic aneurysm (AAA) without rupture (HCC)   . Obesity, Class III, BMI 40-49.9 (morbid obesity) (HCC)   . Abscess of left hip 01/15/2021  . Acute respiratory failure with hypoxia (HCC) 10/05/2020  . Severe aortic stenosis   . Closed comminuted intertrochanteric fracture of proximal end of left femur (HCC) 10/02/2020  . Aortic stenosis   . Obesity   . Cellulitis of left lower extremity 04/29/2018  . GI bleed 06/05/2017  . Nausea vomiting and diarrhea   . Acute ST elevation myocardial infarction (STEMI) of inferior wall (HCC) 06/02/2017  . CAD (coronary artery disease) 2018    Palliative Care Assessment & Plan    Recommendations/Plan: Remains on ventilator. Will continue to follow.     Code Status:    Code Status Orders  (From admission, onward)           Start     Ordered   01/23/21 1543  Do not attempt resuscitation (DNR)  Continuous       Question Answer Comment  In the event of cardiac or respiratory ARREST Do not  call a "code blue"   In the event of cardiac or respiratory ARREST Do not perform Intubation, CPR, defibrillation or ACLS   In the event of cardiac or respiratory ARREST Use medication by any route, position, wound care, and other measures to relive pain and suffering. May use oxygen, suction and manual treatment of airway obstruction as needed for comfort.   Comments MOST form on chart.      01/23/21 1542           Code Status History     Date Active Date Inactive Code Status Order ID Comments User Context   01/15/2021 2309 01/23/2021 1542 Full Code [id number]  [facility] Vernetta Honey, MD Inpatient   10/02/2020 1117 10/09/2020 2150 Full Code [id number]  Lucile Shutters, MD ED   04/30/2018 1018 05/02/2018 1808 DNR [id number]  Adrian Saran, MD Inpatient   04/29/2018 1732 04/30/2018 1018 Full Code [id number]  Houston Siren, MD Inpatient   06/05/2017 0521 06/07/2017 1404 Full Code [id number]  Ihor Austin, MD Inpatient   06/02/2017 0059 06/04/2017 1932 Full Code [id number]  Iran Ouch, MD Inpatient      Advance Directive Documentation    Flowsheet Row Most Recent Value  Type of Advance Directive Healthcare Power of Attorney, Living will  Pre-existing out of facility DNR order (yellow form or pink MOST form) --  "MOST" Form in Place? --        Thank you for allowing the Palliative Medicine Team to assist in the care of this patient.       Total Time 15 min Prolonged Time Billed  no       Greater than 50%  of this time was spent counseling and coordinating care related to the above assessment and plan.  Morton Stall, NP  Please contact Palliative Medicine Team phone at [phone] for questions and concerns.

## 2021-01-25 NOTE — Anesthesia Postprocedure Evaluation (Signed)
Anesthesia Post Note  Patient: Monica [name]  Procedure(s) Performed: ENDOVASCULAR REPAIR/STENT GRAFT  Patient location during evaluation: SICU Anesthesia Type: General Level of consciousness: sedated and patient remains intubated per anesthesia plan Pain management: pain level controlled Vital Signs Assessment: post-procedure vital signs reviewed and stable Respiratory status: patient remains intubated per anesthesia plan Cardiovascular status: stable Postop Assessment: no apparent nausea or vomiting Anesthetic complications: no   No notable events documented.   Last Vitals:  Vitals:   01/25/21 0234 01/25/21 0300  BP:  (!) 168/57  Pulse:  65  Resp:  17  Temp:    SpO2: 95% 95%    Last Pain:  Vitals:   01/25/21 0000  TempSrc: Oral  PainSc:                  Jules Schick

## 2021-01-25 NOTE — Progress Notes (Signed)
[facility]  Daily Progress Note   Subjective  - 1 Day Post-Op  Patient seen early this morning.  Uneventful night.  She remains intubated.  Overall she has been hemodynamically stable.  Objective Vitals:   01/25/21 1346 01/25/21 1418 01/25/21 1522 01/25/21 1543  BP: (!) 110/46     Pulse: 67   67  Resp: 19   18  Temp:      TempSrc:      SpO2: 92% 94% 90% 95%  Weight:      Height:        Intake/Output Summary (Last 24 hours) at 01/25/2021 1550 Last data filed at 01/25/2021 0700 Gross per 24 hour  Intake 990.56 ml  Output 200 ml  Net 790.56 ml    PULM  Normal effort , no use of accessory muscles CV  No JVD, RRR Abd      No distended, nontender VASC  groins clean dry and intact pedal pulses dopplerable  Laboratory CBC    Component Value Date/Time   WBC 7.4 01/25/2021 0534   HGB 11.6 (L) 01/25/2021 0534   HCT 37.7 01/25/2021 0534   PLT 180 01/25/2021 0534    BMET    Component Value Date/Time   NA 135 01/25/2021 0534   K 4.6 01/25/2021 0534   CL 95 (L) 01/25/2021 0534   CO2 31 01/25/2021 0534   GLUCOSE 121 (H) 01/25/2021 0534   BUN 29 (H) 01/25/2021 0534   CREATININE 1.02 (H) 01/25/2021 0534   CREATININE 0.93 01/25/2021 0534   CALCIUM 8.5 (L) 01/25/2021 0534   GFRNONAA 54 (L) 01/25/2021 0534   GFRNONAA >60 01/25/2021 0534   GFRAA >60 05/02/2018 0336    Assessment/Planning: POD #1 s/p stent graft repair of an 8.2 cm abdominal aortic aneurysm  Continue supportive care.  I did discuss post procedure with her son that her cardiopulmonary status would be the major hurdle in her recovery.  We will attempt to wean her today and see how that progresses.   Levora Dredge  01/25/2021, 3:50 PM

## 2021-01-25 NOTE — Progress Notes (Signed)
Pt extubated and placed on 15L [place] with sat in low 90's

## 2021-01-25 NOTE — Progress Notes (Signed)
Pt extubated earlier this afternoon.  Pt is DNR/DNI, was intubated for surgery yesterday.    Pt with hypoxia (SpO2 70's on HHFNC) post extubation.  Pt with no acute distress, is awake and alert, denies SOB, chest pain.  Flow rate increased and FiO2 increased  Had discussion at bedside with pt, her son, and grandson regarding direction of care if she were to decompensate and develop worsening hypoxia or respiratory distress. She states she would only want the ventilator if it were short term.  Explained that given her significant co-morbidities (severe aortic stenosis, pulmonary HTN, CHF, Pulmonary Fibrosis) along with failure of extubation, that it is very likely she would require long term ventilatory support.  Pt states that she would be willing to try BiPAP, but would not want reintubation.  If she were to decompensate or struggle, would want to be made Comfortable and let nature take its course. Both pt's son and grandson at bedside and present as pt made her wishes known.    PLAN: DNR/DNI BiPAP if needed No reintubation If decompensates, would want to shift to comfort measures Palliative Care is following      Harlon Ditty, AGACNP-BC [facility] Prefer epic messenger for cross cover needs If after hours, please call E-link

## 2021-01-25 NOTE — Progress Notes (Signed)
NAME:  Monica [name], MRN:  [id number], DOB:  1934/04/25, LOS: 44 ADMISSION DATE:  01/15/2021, CONSULTATION DATE:  01/24/2021 REFERRING MD:  Dr. Lucky Cowboy, CHIEF COMPLAINT:  Post-op Respiratory Failure   Brief patient description/synopsis:  87 y.o. Female admitted with post-op MRSA infection of left hip.  Incidentally found to have large AAA, status post endovascular repair on 01/24/21.  Returns to ICU post procedure and remains intubated.  History of Present Illness:  Monica [name] is a 87 y.o. Female with a past medical history significant for  CAD s/p PCI x1 in 2018, severe AS being worked up for TAVR, Obesity, pHTN, and COPD who presented to [facility] ED on 6/6/200 with left hip pain, swelling, erythema, tenderness, and warmth.   She had hip fracture after fall in Feb, underwent left hip intramedullary nailing, and was discharged home.  Since being home 4 months ago, really never able to walk due to pain. Finally, in the last week before admission, developed redness and worse pain at left hip surgical site, then wound dehisced.  ED COURSE: Evaluation in the ED showed a subcutaneous fluid collection on CT Scan.  Additionally CT scan showed a large infrarenal aortic aneurysm. Orthopedics and Vascular Surgery was consulted. Cultures of the left hip wound was positive for MRSA.    She was admitted for further workup and treatment of post-surgical hip infection with MRSA.    HOSPITAL COURSE: On 01/16/21 she underwent irrigation and debridement of her left hip postoperative wound. ID was consulted, and is recommending 4 weeks of vancomycin and rifampin.    Given her severe aortic stenosis, plan is for TAVR work-up after AAA repair.  On 01/24/21 she underwent Endovascular repair of the AAA.  Post procedure she remains intubated and is transferred to ICU.  PCCM is consulted for management of ventilator.  Pertinent  Medical History  Aortic Stenosis Coronary Artery Disease Hyperlipidemia Recent Left Hip  Fracture in February 2022  Micro Data:  01/15/2021: SARS-CoV-2 and influenza PCR>> negative 01/15/2021: Blood culture>>STAPHYLOCOCCUS EPIDERMIDIS (suspected contaminant) 01/15/2021: Repeat blood culture>> no growth 01/16/2021: Left hip wound abscess>> MRSA 01/12/2021: Blood culture x2>> no growth  Antimicrobials:  Cefazolin 6/15 x 1 dose (surgical prophylaxis) Cefepime 6/7>> 6/9 Zosyn 6/6>> 6/6 Rifampin 6/7>> Vancomycin 6/6>> P.o. fluconazole 6/10>> 6/10  Significant Hospital Events: Including procedures, antibiotic start and stop dates in addition to other pertinent events   01/15/2021: Admitted to [place]; Orthopedics, Vascular Surgery consulted 01/16/2021: Underwent irrigation and debridement of left hip wound 01/24/2021: Status post endovascular repair of AAA, returns to ICU and remains intubated postop  Interim History / Subjective:  -Underwent Endovascular repair of AAA today -Returns to ICU post procedure and remains intubated -PCCM consulted for vent management -Hemodynamically stable, no Vasopressors  Objective   Blood pressure (!) 119/41, pulse 62, temperature (!) 100.4 F (38 C), temperature source Oral, resp. rate 20, height 5' 5"  (1.651 m), weight 102 kg, SpO2 93 %.    Vent Mode: PRVC FiO2 (%):  [45 %-60 %] 45 % Set Rate:  [15 bmp] 15 bmp Vt Set:  [450 mL] 450 mL PEEP:  [5 cmH20] 5 cmH20 Pressure Support:  [5 cmH20] 5 cmH20   Intake/Output Summary (Last 24 hours) at 01/25/2021 1103 Last data filed at 01/25/2021 0700 Gross per 24 hour  Intake 1990.56 ml  Output 1000 ml  Net 990.56 ml    Filed Weights   01/15/21 1638 01/24/21 1500  Weight: 106.6 kg 102 kg    Examination: General: Acute on chronically  ill-appearing female, laying in bed, intubated sedated, no acute distress HENT: Atraumatic, normocephalic, neck supple, no JVD Lungs: Mechanical breath sounds bilaterally, even, occasional breaths over the ventilator Cardiovascular: Regular rate and rhythm, +  murmur Abdomen: Obese, soft, nontender, nondistended, no guarding or rebound tenderness Extremities: Bilateral lower extremities: Dusky in appearance Neuro: Lethargic/lightly sedated (just arrived from PACU), withdraws from pain, pupils PERRLA sluggish 2 mm bilaterally GU: Foley catheter in place draining yellow urine Skin: Bilateral PAD's is in place clean dry and intact  Labs/imaging that I havepersonally reviewed  (right click and "Reselect all SmartList Selections" daily)  Labs 01/24/2021: Sodium 133, potassium 3.2, bicarb 33, BUN 25, creatinine 0.74,  Chest x-ray 6/15>>1. Appropriately positioned endotracheal tube and right internal jugular central venous catheter. 2. Pulmonary fibrosis without superimposed acute process. CT Left Hip 6/6>>1. Status post ORIF of a comminuted intertrochanteric fracture of the left femur with displaced greater and lesser trochanteric fracture fragments, grossly similar alignment compared to intraoperative fluoroscopic images. No bridging bone formation across the fracture site. 2. Small fluid collection within the subcutaneous soft tissues, likely along a surgical incision site the level of the left iliac wing measuring approximately 4.1 x 0.8 x 3.2 cm. Findings may represent a postoperative seroma or hematoma. Infected fluid collection would be difficult to exclude by imaging alone. 3. Partially visualized infrarenal abdominal aortic aneurysm with extensive mural thrombus measuring approximately 6.7 x 6.1 cm. Recommend referral to a vascular specialist. This recommendation follows ACR consensus guidelines: White Paper of the ACR Incidental Findings Committee II on Vascular Findings. J Am Coll Radiol 2013; 10:789-794. 4. Proximal right common iliac artery aneurysm measuring approximately 2.5 cm CTA Chest/Abdomen/Pelvis 6/8>>1. Infrarenal abdominal aortic aneurysm measuring up to 7.7 x 7.6 cm. Recommend referral to a vascular specialist. This  recommendation follows ACR consensus guidelines: White Paper of the ACR Incidental Findings Committee II on Vascular Findings. J Am Coll Radiol 2013; 10:789-794. 2. 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 3. 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 4. 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 5. Mediastinal and hilar lymphadenopathy similar to prior examination. This may be due to inflammation or malignancy. 6. Incompletely characterized 1.2 cm mass at the inferior tip of the left kidney should be further evaluated with contrast enhanced CT or MRI of the abdomen on nonemergent basis. 7. 5 mm right upper lobe pulmonary nodule is unchanged since prior exam. No follow-up needed if patient is low-risk. Non-contrast chest CT can be considered in 12 months if patient is high-risk. This recommendation follows the consensus statement: Guidelines for Management of Incidental Pulmonary Nodules Detected on CT Images: From the Fleischner Society 2017; Radiology 2017; 284:228-243. CTA Chest 6/11>>No evidence of pulmonary embolus. Mild cardiomegaly.  Evidence of pulmonary arterial hypertension. Peripheral interstitial thickening and ground-glass opacities likely reflects fibrosis. Dependent and bibasilar atelectasis or scarring. Stable mediastinal and bilateral hilar adenopathy. Coronary artery disease. Cholelithiasis. Aortic Atherosclerosis CTA Abdomen 6/12>>1. Stable 7.8 x 7.6 cm infrarenal abdominal aortic aneurysm. No evidence for acute rupture. 2. Stable right common iliac artery aneurysm with maximum diameter of 2.7 cm. 3. Stable advanced atherosclerotic calcifications involving the aorta and branch vessels. 4. Stable significant chronic lung disease and mediastinal adenopathy. 5. Stable cholelithiasis. 6. Stable 13 mm lower pole left renal lesion. As before recommend follow-up MRI abdomen  for further evaluation.   Aortic Atherosclerosis  Resolved Hospital Problem list  Assessment & Plan:   Post-Op Respiratory Failure Acute on Chronic Hypoxic Respiratory Failure due to Aortic Stenosis, CHF, Pulmonary HTN, and Pulmonary Fibrosis -Full vent support, implement lung protective strategies -Wean FiO2 and PEEP as tolerated to maintain O2 sats >90% -Follow intermittent chest x-ray and ABG as needed -Spontaneous breathing trials when respiratory parameters met and mental status permits -Implement VAP bundle -As needed bronchodilators -Continue Solu-Medrol BID -CT angiogram of the chest with patchy subpleural fibrosis, no PE, moderate atelectasis, no edema or large consolidation, no effusion.  Chronic HFpEF PMHx of Severe Aortic Stenosis, CAD -Continuous cardiac monitoring -Maintain MAP greater than 65 -Continue midodrine -Diuresis as blood pressure and renal function permits -Cardiology following, appreciate input -Plan for TAVR workup  8.2 cm Juxtarenal Aortic Aneurysm status post endovascular repair on 01/24/2021 -Vascular surgery following  Postsurgical MRSA Hip Infection s/p Debridement of Left Hip wound on 01/16/21 -Monitor fever curve -Trend WBC's -Follow cultures as above -1/2 blood cultures + for staph epidermidis which was felt to be comtaminant -ID following, appreciate input -ABX as per ID, currently on Vancomycin & Rifampin (recommends 4 weeks of treatment)    Pt with multiple co-morbidities, long term prognosis is very poor.  Anticipate difficult wean from ventilator.  Palliative Care is following for goals of care.    Best practice (right click and "Reselect all SmartList Selections" daily)  Diet:  NPO Pain/Anxiety/Delirium protocol (if indicated): Yes (RASS goal -1) VAP protocol (if indicated): Yes DVT prophylaxis: LMWH GI prophylaxis: PPI Glucose control:  SSI No Central venous access:  N/A Arterial line:  N/A Foley:  Yes, and it is still  needed Mobility:  bed rest  PT consulted: N/A Last date of multidisciplinary goals of care discussion [N/A] Code Status:  DNR Disposition: ICU  Labs   CBC: Recent Labs  Lab 01/21/21 0451 01/22/21 0452 01/23/21 0426 01/24/21 0414 01/25/21 0534  WBC 5.8 6.6 5.7 4.2 7.4  HGB 11.7* 12.0 12.9 12.9 11.6*  HCT 37.4 38.7 40.2 41.8 37.7  MCV 75.7* 76.0* 74.2* 76.8* 77.1*  PLT 298 296 287 251 180     Basic Metabolic Panel: Recent Labs  Lab 01/21/21 0451 01/22/21 0452 01/23/21 0426 01/24/21 0414 01/25/21 0534  NA 134* 135 134* 133* 135  K 3.3* 3.9 3.5 3.2* 4.6  CL 94* 93* 89* 90* 95*  CO2 32 35* 33* 33* 31  GLUCOSE 95 103* 113* 92 121*  BUN 19 19 22  25* 29*  CREATININE 0.73 0.74 0.77 0.74 0.93  1.02*  CALCIUM 8.7* 8.6* 8.8* 8.6* 8.5*    GFR: Estimated Creatinine Clearance: 47.7 mL/min (A) (by C-G formula based on SCr of 1.02 mg/dL (H)). Recent Labs  Lab 01/22/21 0452 01/23/21 0426 01/24/21 0414 01/25/21 0534  WBC 6.6 5.7 4.2 7.4     Liver Function Tests: Recent Labs  Lab 01/23/21 0426  AST 26  ALT 11  ALKPHOS 60  BILITOT 1.9*  PROT 7.6  ALBUMIN 3.0*    No results for input(s): LIPASE, AMYLASE in the last 168 hours. No results for input(s): AMMONIA in the last 168 hours.  ABG    Component Value Date/Time   PHART 7.47 (H) 01/24/2021 1708   PCO2ART 49 (H) 01/24/2021 1708   PO2ART 125 (H) 01/24/2021 1708   HCO3 35.7 (H) 01/24/2021 1708   O2SAT 99.0 01/24/2021 1708      Coagulation Profile: No results for input(s): INR, PROTIME in the last 168 hours.  Cardiac Enzymes: No results for input(s): CKTOTAL, CKMB, CKMBINDEX, TROPONINI  in the last 168 hours.  HbA1C: Hgb A1c MFr Bld  Date/Time Value Ref Range Status  06/02/2017 06:15 AM 5.7 (H) 4.8 - 5.6 % Final    Comment:    (NOTE) Pre diabetes:          5.7%-6.4% Diabetes:              >6.4% Glycemic control for   <7.0% adults with diabetes     CBG: Recent Labs  Lab 01/24/21 1515   GLUCAP 141*    Review of Systems:   Unable to assess due to Intubation/sedation  Past Medical History:  She,  has a past medical history of Aortic stenosis, CAD (coronary artery disease) (2018), GI bleed, HLD (hyperlipidemia), and Obesity.   Surgical History:   Past Surgical History:  Procedure Laterality Date   ANKLE RECONSTRUCTION  1956   also ORIF of right arm   CORONARY STENT INTERVENTION N/A 06/01/2017   Procedure: Coronary/Graft Acute MI Revascularization;  Surgeon: Wellington Hampshire, MD;  Location: [place] [facility];  Service: Cardiovascular;  Laterality: N/A;   ENDOVASCULAR REPAIR/STENT GRAFT N/A 01/24/2021   Procedure: ENDOVASCULAR REPAIR/STENT GRAFT;  Surgeon: Katha Cabal, MD;  Location: [place] [facility];  Service: Cardiovascular;  Laterality: N/A;   INCISION AND DRAINAGE HIP Left 01/16/2021   Procedure: IRRIGATION AND DEBRIDEMENT HIP;  Surgeon: Leim Fabry, MD;  Location: [facility];  Service: Orthopedics;  Laterality: Left;   INTRAMEDULLARY (IM) NAIL INTERTROCHANTERIC Left 10/03/2020   Procedure: INTRAMEDULLARY (IM) NAIL INTERTROCHANTRIC;  Surgeon: Leim Fabry, MD;  Location: [facility];  Service: Orthopedics;  Laterality: Left;   LEFT HEART CATH AND CORONARY ANGIOGRAPHY N/A 06/01/2017   Procedure: LEFT HEART CATH AND CORONARY ANGIOGRAPHY;  Surgeon: Wellington Hampshire, MD;  Location: [place] [facility];  Service: Cardiovascular;  Laterality: N/A;     Social History:   reports that she quit smoking about 3 years ago. Her smoking use included cigarettes. She has a 25.00 pack-year smoking history. She has never used smokeless tobacco. She reports that she does not drink alcohol and does not use drugs.   Family History:  Her family history includes Diabetes Mellitus II in her daughter; Valvular heart disease in her mother.   Allergies No Known Allergies   Home Medications  Prior to Admission medications   Medication Sig Start Date End Date Taking? Authorizing  Provider  aspirin 81 MG EC tablet Take 81 mg by mouth daily.   Yes [provider]  carvedilol (COREG) 3.125 MG tablet Take 3.125 mg by mouth 2 (two) times daily with a meal.   Yes [provider]  oxyCODONE (OXY IR/ROXICODONE) 5 MG immediate release tablet Take 5 mg by mouth every 4 (four) hours as needed for severe pain.   Yes [provider]  rosuvastatin (CRESTOR) 10 MG tablet Take 1 tablet by mouth once daily Patient taking differently: Take 10 mg by mouth daily. 07/24/20  Yes Wellington Hampshire, MD  oxyCODONE (OXY IR/ROXICODONE) 5 MG immediate release tablet Take 1-2 tablets (5-10 mg total) by mouth every 4 (four) hours as needed for moderate pain (pain score 4-6). 01/19/21   Lattie Corns, PA-C     Critical care provider statement:    Critical care time (minutes):  33   Critical care time was exclusive of:  Separately billable procedures and  treating other patients   Critical care was necessary to treat or prevent imminent or  life-threatening deterioration of the following conditions:  acute  hypoxemic respiratry failure, CHF, AAA s/p repair, PJI   Critical care was time spent personally by me on the following  activities:  Development of treatment plan with patient or surrogate,  discussions with consultants, evaluation of patient's response to  treatment, examination of patient, obtaining history from patient or  surrogate, ordering and performing treatments and interventions, ordering  and review of laboratory studies and re-evaluation of patient's condition   I assumed direction of critical care for this patient from another  provider in my specialty: no         Ottie Glazier, M.D.  Pulmonary & [place]

## 2021-01-25 NOTE — Progress Notes (Signed)
SLP Cancellation Note  Patient Details Name: Champayne Kocian MRN: [id number] DOB: 10/02/1933   Cancelled treatment:       Reason Eval/Treat Not Completed: Fatigue/lethargy limiting ability to participate;Patient not medically ready (chart reviewed; consulted NSG staff) Per report, pt was recently extubated around Noon today. She is tolerating extubation but remains drowsy/sleepy currently. Pt is on 15L O2 support also. ST services will f/u in the morning for BSE when pt is fully awake/alert for safe assessment of oral intake; will monitor O2 status. Recommend frequent oral care for hygiene and stimulation of swallowing until then.     Jerilynn Som, MS, CCC-SLP Speech Language Pathologist Rehab Services [phone] [facility] 01/25/2021, 4:02 PM

## 2021-01-25 NOTE — Progress Notes (Signed)
Pharmacy Antibiotic Note  Monica [name] is a 87 y.o. female admitted on 01/15/2021 with cellulitis.  Pharmacy has been consulted for Vancomycin Dosing. S/p debridement of the left hip wound by  6/7 with MRSA from OR cultures.  Also on rifampin for presence of foreign material/hardware.    Today, 01/25/2021 Day #10 vancomycin and Day #7 rifampin Renal: increase in SCr overnight AAA repair performed 6/15 WBC WNL Tm 100.4  Plan: Continue Vancomycin 1250mg  IV q24h Goal AUC 400-600 Monitor renal function - Scr increased overnight,  ? Related to contrast Monitor LFTs Plan to check vancomycin trough within 48h   Height: 5\' 5"  (165.1 cm) Weight: 102 kg (224 lb 13.9 oz) IBW/kg (Calculated) : 57  Temp (24hrs), Avg:98.2 F (36.8 C), Min:97 F (36.1 C), Max:100.4 F (38 C)  Recent Labs  Lab 01/20/21 0132 01/20/21 2113 01/21/21 0451 01/22/21 0452 01/23/21 0426 01/24/21 0414 01/25/21 0534  WBC 9.5  --  5.8 6.6 5.7 4.2 7.4  CREATININE 0.82  --  0.73 0.74 0.77 0.74 0.93  1.02*  VANCOTROUGH  --  16  --   --   --   --   --   VANCOPEAK 19*  --   --   --   --   --   --      Estimated Creatinine Clearance: 47.7 mL/min (A) (by C-G formula based on SCr of 1.02 mg/dL (H)).    No Known Allergies  Antimicrobials this admission: zosyn x1 dose 6/6 cefepime 6/7>>6/9 Vanc 6/6 (evening)>> Rifampin 6/10>>   Microbiology results:  6/6 BCx: 2of4 (1 set)= GPC=Staph epi, sens pending 6/7 Wound x 3: MRSA 6/8 Bcx: NG  Thank you for allowing pharmacy to be a part of this patient's care.  8/7, PharmD, BCPS.   Work Cell: [phone] 01/25/2021 12:51 PM

## 2021-01-25 NOTE — Progress Notes (Signed)
Progress Note  Patient Name: Monica [name] Date of Encounter: 01/25/2021  Primary Cardiologist: Kirke Corin  Subjective   She underwent open exposure of the left brachial artery, endovascular repair of AAA, stenting of the left renal artery, and coil embolization of the right internal iliac artery on 6/15. Post procedure, she remains intubated and sedated. Prior to procedure, she was on 10-11 L supplemental oxygen via nasal cannula. BP soft, though stable on sedation. Renal function stable. Potassium repleted.   Inpatient Medications    Scheduled Meds:  Chlorhexidine Gluconate Cloth  6 each Topical Daily   docusate  100 mg Per Tube BID   enoxaparin (LOVENOX) injection  40 mg Subcutaneous Q24H   feeding supplement  237 mL Oral BID BM   ipratropium-albuterol  3 mL Nebulization Q6H   methylPREDNISolone (SOLU-MEDROL) injection  60 mg Intravenous Q12H   midodrine  2.5 mg Oral TID WC   multivitamin with minerals  1 tablet Oral Daily   mupirocin ointment  1 application Nasal BID   pantoprazole (PROTONIX) IV  40 mg Intravenous Daily   polyethylene glycol  17 g Per Tube Daily   rifampin  300 mg Oral BID WC   rosuvastatin  10 mg Oral Daily   Continuous Infusions:  sodium chloride 10 mL/hr at 01/25/21 0345   dexmedetomidine (PRECEDEX) IV infusion 0.4 mcg/kg/hr (01/25/21 0345)   fentaNYL infusion INTRAVENOUS 250 mcg/hr (01/25/21 0345)   methocarbamol (ROBAXIN) IV     norepinephrine (LEVOPHED) Adult infusion 5 mcg/min (01/25/21 0345)   promethazine (PHENERGAN) injection (IM or IVPB)     vancomycin Stopped (01/25/21 0029)   PRN Meds: bisacodyl, fentaNYL, fentaNYL (SUBLIMAZE) injection, guaiFENesin-dextromethorphan, heparin 5000 units in normal saline injection (flush), ipratropium-albuterol, lip balm, methocarbamol **OR** methocarbamol (ROBAXIN) IV, metoCLOPramide **OR** metoCLOPramide (REGLAN) injection, midazolam, midazolam, nitroGLYCERIN, ondansetron **OR** ondansetron (ZOFRAN) IV,  promethazine **OR** promethazine (PHENERGAN) injection (IM or IVPB) **OR** promethazine, senna-docusate, sodium phosphate, Surgifoam 1 Gm with Thrombin 5,000 units (5 ml) topical solution, traZODone   Vital Signs    Vitals:   01/25/21 0700 01/25/21 0757 01/25/21 0800 01/25/21 0900  BP: (!) 126/48  (!) 100/40 (!) 119/41  Pulse: (!) 53  61 62  Resp: 17  20 20   Temp:   (!) 100.4 F (38 C)   TempSrc:   Oral   SpO2: 94% 93% 94% 93%  Weight:      Height:        Intake/Output Summary (Last 24 hours) at 01/25/2021 1022 Last data filed at 01/25/2021 0700 Gross per 24 hour  Intake 1990.56 ml  Output 1250 ml  Net 740.56 ml   Filed Weights   01/15/21 1638 01/24/21 1500  Weight: 106.6 kg 102 kg    Telemetry    SR - Personally Reviewed  ECG    No new tracings - Personally Reviewed  Physical Exam   GEN: Chronically ill appearing.    Neck: JVD unable to be assessed secondary to respiratory support apparatus. Cardiac: RRR, III/VI systolic murmur RUSB, no rubs, or gallops.  Respiratory: Diminished and vented breath sounds.  GI: Soft, nontender, non-distended.   MS: No edema; No deformity. Neuro:  Intubated and sedated. Opens eyes. Psych: Intubated and sedated.  Labs    Chemistry Recent Labs  Lab 01/23/21 0426 01/24/21 0414 01/25/21 0534  NA 134* 133* 135  K 3.5 3.2* 4.6  CL 89* 90* 95*  CO2 33* 33* 31  GLUCOSE 113* 92 121*  BUN 22 25* 29*  CREATININE 0.77 0.74 0.93  1.02*  CALCIUM 8.8* 8.6* 8.5*  PROT 7.6  --   --   ALBUMIN 3.0*  --   --   AST 26  --   --   ALT 11  --   --   ALKPHOS 60  --   --   BILITOT 1.9*  --   --   GFRNONAA >60 >60 >60  54*  ANIONGAP 12 10 9      Hematology Recent Labs  Lab 01/23/21 0426 01/24/21 0414 01/25/21 0534  WBC 5.7 4.2 7.4  RBC 5.42* 5.44* 4.89  HGB 12.9 12.9 11.6*  HCT 40.2 41.8 37.7  MCV 74.2* 76.8* 77.1*  MCH 23.8* 23.7* 23.7*  MCHC 32.1 30.9 30.8  RDW 19.6* 19.9* 19.4*  PLT 287 251 180    Cardiac EnzymesNo  results for input(s): TROPONINI in the last 168 hours. No results for input(s): TROPIPOC in the last 168 hours.   BNP Recent Labs  Lab 01/21/21 0451 01/22/21 1640  BNP 223.1* 146.7*     DDimer  Recent Labs  Lab 01/20/21 1336  DDIMER 3.53*     Radiology    DG Chest 2 View   Result Date: 01/21/2021 IMPRESSION: Coarse bilateral reticular opacities consistent with underlying pulmonary fibrosis. Electronically Signed   By: 03/23/2021 MD   On: 01/21/2021 10:33   CT Angio Chest Pulmonary Embolism (PE) W or WO Contrast   Result Date: 01/20/2021 IMPRESSION: No evidence of pulmonary embolus. Mild cardiomegaly.  Evidence of pulmonary arterial hypertension. Peripheral interstitial thickening and ground-glass opacities likely reflects fibrosis. Dependent and bibasilar atelectasis or scarring. Stable mediastinal and bilateral hilar adenopathy. Coronary artery disease. Cholelithiasis. Aortic Atherosclerosis (ICD10-I70.0). Electronically Signed   By: 03/22/2021 M.D.   On: 01/20/2021 19:04   CT ANGIO ABDOMEN W &/OR WO CONTRAST   Result Date: 01/21/2021 IMPRESSION: 1. Stable 7.8 x 7.6 cm infrarenal abdominal aortic aneurysm. No evidence for acute rupture. 2. Stable right common iliac artery aneurysm with maximum diameter of 2.7 cm. 3. Stable advanced atherosclerotic calcifications involving the aorta and branch vessels. 4. Stable significant chronic lung disease and mediastinal adenopathy. 5. Stable cholelithiasis. 6. Stable 13 mm lower pole left renal lesion. As before recommend follow-up MRI abdomen for further evaluation. Aortic Atherosclerosis (ICD10-I70.0). Electronically Signed   By: 03/23/2021 M.D.   On: 01/21/2021 15:36    DG Abd 1 View  Result Date: 01/24/2021 IMPRESSION: Enteric tube tip in the right upper quadrant consistent with location in the distal stomach. Aortic aneurysm post stent graft repair. Electronically Signed   By: 01/26/2021 M.D.   On: 01/24/2021 18:12    PERIPHERAL VASCULAR CATHETERIZATION  Result Date: 01/24/2021 See surgical note for result.  DG Chest Port 1 View  Result Date: 01/24/2021 IMPRESSION: 1. Appropriately positioned endotracheal tube and right internal jugular central venous catheter. 2. Pulmonary fibrosis without superimposed acute process. Electronically Signed   By: 01/26/2021 M.D.   On: 01/24/2021 15:23    Cardiac Studies   2D echo 10/03/2020: 1. Left ventricular ejection fraction, by estimation, is 60 to 65%. The  left ventricle has normal function. Left ventricular endocardial border  not optimally defined to evaluate regional wall motion. There is mild left  ventricular hypertrophy. Left  ventricular diastolic parameters are consistent with Grade I diastolic  dysfunction (impaired relaxation). Elevated left atrial pressure.   2. Right ventricular systolic function is normal. The right ventricular  size is mildly enlarged. Tricuspid regurgitation signal is inadequate for  assessing  PA pressure.   3. Left atrial size was mildly dilated.   4. Right atrial size was mildly dilated.   5. The mitral valve is degenerative. Trivial mitral valve regurgitation.  Mild mitral stenosis. Moderate mitral annular calcification.   6. Tricuspid valve regurgitation at least moderate but suboptimally  visualized.   7. The aortic valve is tricuspid. There is moderate calcification of the  aortic valve. There is severe thickening of the aortic valve. Aortic valve  regurgitation is not visualized. Severe aortic valve stenosis. Aortic  valve area, by VTI measures 0.51  cm. Aortic valve mean gradient measures 60.0 mmHg. Aortic valve Vmax  measures 4.83 m/s.   8. The inferior vena cava is normal in size with greater than 50%  respiratory variability, suggesting right atrial pressure of 3 mmHg.   Comparison(s): Very severe aortic stenosis, worsened compared with prior  echocardiogram from 10/26/2019. __________   2D echo  10/26/2019: 1. Left ventricular ejection fraction, by estimation, is 60 to 65%. The  left ventricle has normal function. The left ventricle has no regional  wall motion abnormalities. There is moderate left ventricular hypertrophy.  Indeterminate diastolic filling due   to E-A fusion.   2. Right ventricular systolic function is normal. The right ventricular  size is normal. Tricuspid regurgitation signal is inadequate for assessing  PA pressure.   3. Left atrial size was mildly dilated.   4. The mitral valve is degenerative. No evidence of mitral valve  regurgitation. No evidence of mitral stenosis.   5. The aortic valve has an indeterminant number of cusps. Aortic valve  regurgitation is trivial. Moderate to severe aortic valve stenosis. Aortic  valve area, by VTI measures 0.88 cm. Aortic valve mean gradient measures  34.0 mmHg. Aortic valve Vmax  measures 3.92 m/s.   6. The inferior vena cava is normal in size with greater than 50%  respiratory variability, suggesting right atrial pressure of 3 mmHg. ___________   2D echo 04/30/2018: - Left ventricle: The cavity size was normal. There was mild    concentric hypertrophy. Systolic function was normal. The    estimated ejection fraction was in the range of 60% to 65%. Wall    motion was normal; there were no regional wall motion    abnormalities. Doppler parameters are consistent with abnormal    left ventricular relaxation (grade 1 diastolic dysfunction).  - Aortic valve: There was moderate to severe stenosis. Mean    gradient (S): 22 mm Hg. Valve area (VTI): 0.99 cm^2.  - Left atrium: The atrium was moderately dilated.  - Pulmonary arteries: Systolic pressure was mildly increased. PA    peak pressure: 35 mm Hg (S).  - Pericardium, extracardiac: A trivial pericardial effusion was    identified posterior to the heart. ___________   2D echo 12/23/2017: - Left ventricle: The cavity size was normal. There was mild    concentric  hypertrophy. Systolic function was normal. The    estimated ejection fraction was in the range of 60% to 65%. Wall    motion was normal; there were no regional wall motion    abnormalities. Doppler parameters are consistent with abnormal    left ventricular relaxation (grade 1 diastolic dysfunction).  - Aortic valve: Transvalvular velocity was increased. There was    moderate to severe stenosis by estimated AVA, moderate stenosis    by mean gradient and peak velocity. . Peak velocity (S): 376    cm/s. Mean gradient (S): 31 mm Hg. Valve area (VTI): 0.97  cm^2.  - Mitral valve: Calcified annulus.  - Left atrium: The atrium was mildly dilated.  - Right ventricle: Systolic function was normal.  - Pulmonary arteries: Systolic pressure was mildly elevated. PA    peak pressure: 43 mm Hg (S). ___________   2D echo 06/02/2017: - Left ventricle: The cavity size was normal. There was mild    concentric hypertrophy. Systolic function was normal. The    estimated ejection fraction was in the range of 55% to 60%. Mild    hypokinesis of the basal-midinferior myocardium. Doppler    parameters are consistent with abnormal left ventricular    relaxation (grade 1 diastolic dysfunction).  - Aortic valve: There was moderate to severe stenosis. Mean    gradient (S): 21 mm Hg. Valve area (VTI): 0.99 cm^2.  - Mitral valve: Calcified annulus. There was mild regurgitation.  - Left atrium: The atrium was mildly dilated. ___________   LHC 06/01/2017: Mid RCA lesion, 50 %stenosed. Dist RCA lesion, 30 %stenosed. LM lesion, 20 %stenosed. Ost 1st Diag to 1st Diag lesion, 20 %stenosed. Ost Cx to Prox Cx lesion, 50 %stenosed. A drug eluting stent was successfully placed. Prox RCA to Mid RCA lesion, 100 %stenosed. Post intervention, there is a 0% residual stenosis.   1. Severe one-vessel coronary artery disease with thrombotic occlusion of the proximal and mid right coronary artery. Moderate ostial left  circumflex stenosis and mild LAD disease. 2. At least moderate aortic stenosis with a peak to peak gradient of 22 mmHg. Normal left ventricular end-diastolic pressure. 3. Successful angioplasty and drug-eluting stent placement to the proximal right coronary artery extending into the midsegment. 4. Severe hypotension and bradycardia after opening the right coronary artery  likely due to neurohormonal reflects and required treatment with atropine and norepinephrine drip which was discontinued at the end of the case.   Recommendations: Dual antiplatelet therapy for at least one year. Aggressive treatment of risk factors and smoking cessation. Obtain an echocardiogram to evaluate aortic stenosis and LV systolic function. Avoid catheterization via the right radial artery in the future due to right radial loop.  Patient Profile     87 y.o. 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  Assessment & Plan    1. Acute on chronic hypoxic respiratory failure: -Remains intubated and sedated post procedure  -Multifactorial including pulmonary fibrosis, severe aortic stenosis, and HFpEF -Vent management per CCM -MD considering IV Lasix, avoid over diuresis with severe AS -May need to consider RHC pending respiratory status    2. Severe aortic stenosis: -Will need TAVR evaluation as an outpatient -She remains on midodrine    4. CAD involving the native coronary arteries s/p PCI in 2018 with normal high sensitivity troponin: -Would recommend resuming PTA aspirin 81 mg daily if there are no contraindications -PTA Crestor   5. Post surgical hip infection: -Status post debridement in the OR on 6/7 with  intraoperative cultures growing MRSA -Blood cultures no growth x 2   6. Iron deficiency anemia: -Status post Feraheme 6/10 -Stable  7. Hypokalemia: -Repleted    For questions or updates, please contact [facility] Please consult [email or web address] for contact info under Cardiology/STEMI.    Signed, Eula Listen, PA-C [facility] Pager: [phone] 01/25/2021,  10:22 AM

## 2021-01-26 DIAGNOSIS — I714 Abdominal aortic aneurysm, without rupture: Secondary | ICD-10-CM | POA: Diagnosis not present

## 2021-01-26 DIAGNOSIS — Z7189 Other specified counseling: Secondary | ICD-10-CM | POA: Diagnosis not present

## 2021-01-26 DIAGNOSIS — J8410 Pulmonary fibrosis, unspecified: Secondary | ICD-10-CM | POA: Diagnosis not present

## 2021-01-26 DIAGNOSIS — R0902 Hypoxemia: Secondary | ICD-10-CM | POA: Diagnosis not present

## 2021-01-26 DIAGNOSIS — L02416 Cutaneous abscess of left lower limb: Secondary | ICD-10-CM | POA: Diagnosis not present

## 2021-01-26 DIAGNOSIS — R0602 Shortness of breath: Secondary | ICD-10-CM | POA: Diagnosis not present

## 2021-01-26 DIAGNOSIS — J811 Chronic pulmonary edema: Secondary | ICD-10-CM

## 2021-01-26 LAB — BASIC METABOLIC PANEL
Anion gap: 11 (ref 5–15)
BUN: 37 mg/dL — ABNORMAL HIGH (ref 8–23)
CO2: 29 mmol/L (ref 22–32)
Calcium: 8.5 mg/dL — ABNORMAL LOW (ref 8.9–10.3)
Chloride: 96 mmol/L — ABNORMAL LOW (ref 98–111)
Creatinine, Ser: 1.16 mg/dL — ABNORMAL HIGH (ref 0.44–1.00)
GFR, Estimated: 46 mL/min — ABNORMAL LOW (ref >60)
Glucose, Bld: 130 mg/dL — ABNORMAL HIGH (ref 70–99)
Potassium: 3.9 mmol/L (ref 3.5–5.1)
Sodium: 136 mmol/L (ref 135–145)

## 2021-01-26 LAB — HYPERSENSITIVITY PNEUMONITIS
A. Pullulans Abs: NEGATIVE
A.Fumigatus #1 Abs: NEGATIVE
Micropolyspora faeni, IgG: NEGATIVE
Pigeon Serum Abs: NEGATIVE
Thermoact. Saccharii: NEGATIVE
Thermoactinomyces vulgaris, IgG: NEGATIVE

## 2021-01-26 LAB — CBC
HCT: 37.8 % (ref 36.0–46.0)
Hemoglobin: 11.8 g/dL — ABNORMAL LOW (ref 12.0–15.0)
MCH: 23.8 pg — ABNORMAL LOW (ref 26.0–34.0)
MCHC: 31.2 g/dL (ref 30.0–36.0)
MCV: 76.4 fL — ABNORMAL LOW (ref 80.0–100.0)
Platelets: 165 10*3/uL (ref 150–400)
RBC: 4.95 MIL/uL (ref 3.87–5.11)
RDW: 19.9 % — ABNORMAL HIGH (ref 11.5–15.5)
WBC: 8.7 10*3/uL (ref 4.0–10.5)
nRBC: 0 % (ref 0.0–0.2)

## 2021-01-26 LAB — VANCOMYCIN, RANDOM: Vancomycin Rm: 15

## 2021-01-26 MED ORDER — PNEUMOCOCCAL VAC POLYVALENT 25 MCG/0.5ML IJ INJ
0.5000 mL | INJECTION | INTRAMUSCULAR | Status: DC
  Filled 2021-01-26 (×2): qty 0.5

## 2021-01-26 MED ORDER — VANCOMYCIN HCL 1000 MG/200ML IV SOLN
1000.0000 mg | Freq: Once | INTRAVENOUS | Status: DC
  Filled 2021-01-26: qty 200

## 2021-01-26 MED ORDER — CHLORHEXIDINE GLUCONATE CLOTH 2 % EX PADS
6.0000 | MEDICATED_PAD | Freq: Every day | CUTANEOUS | Status: DC
  Administered 2021-01-27 – 2021-02-09 (×13): 6 via TOPICAL

## 2021-01-26 MED ORDER — VANCOMYCIN HCL IN DEXTROSE 1-5 GM/200ML-% IV SOLN
1000.0000 mg | Freq: Once | INTRAVENOUS | Status: AC
  Administered 2021-01-26: 1000 mg via INTRAVENOUS
  Filled 2021-01-26: qty 200

## 2021-01-26 NOTE — Progress Notes (Addendum)
Daily Progress Note   Patient Name: Monica [name]       Date: 01/26/2021 DOB: 1934/03/16  Age: 87 y.o. MRN#: [id number] Attending Physician: Arnetha Courser, MD Primary Care Physician: Patient, No Pcp Per (Inactive) Admit Date: 01/15/2021  Reason for Consultation/Follow-up: Establishing goals of care  Subjective: Patient is resting in bed on high flow cannula. Prior to this she was on BIPAP. Son and grandson are at bedside. They stepped out to talk. Discussed multiple possible scenarios and trajectories. In any scenario, she would not want to live in a nursing facility. She would not a feeding tube, and she would not want to be reintubated. Family states she was not on oxygen prior to this hospitalization. They state patient's husband who died 1 year ago was on 10 liters of home O2 before he died. Son and grandson discuss her overall frailty and state they will support whatever decisions she makes on care moving forward.  Will continue to follow.   Length of Stay: 11  Current Medications: Scheduled Meds:  . Chlorhexidine Gluconate Cloth  6 each Topical Daily  . docusate  100 mg Per Tube BID  . enoxaparin (LOVENOX) injection  40 mg Subcutaneous Q24H  . feeding supplement  237 mL Oral BID BM  . ipratropium-albuterol  3 mL Nebulization Q6H  . methylPREDNISolone (SOLU-MEDROL) injection  60 mg Intravenous Q12H  . midodrine  2.5 mg Oral TID WC  . multivitamin with minerals  1 tablet Oral Daily  . mupirocin ointment  1 application Nasal BID  . pantoprazole (PROTONIX) IV  40 mg Intravenous Daily  . polyethylene glycol  17 g Per Tube Daily  . rifampin  300 mg Oral BID WC  . rosuvastatin  10 mg Oral Daily  . vancomycin variable dose per unstable renal function (pharmacist dosing)   Does not apply  See admin instructions    Continuous Infusions: . sodium chloride 10 mL/hr at 01/26/21 0541  . methocarbamol (ROBAXIN) IV    . promethazine (PHENERGAN) injection (IM or IVPB) Stopped (01/26/21 0431)  . vancomycin      PRN Meds: bisacodyl, diazepam, guaiFENesin-dextromethorphan, heparin 5000 units in normal saline injection (flush), ipratropium-albuterol, lip balm, methocarbamol **OR** methocarbamol (ROBAXIN) IV, metoCLOPramide **OR** metoCLOPramide (REGLAN) injection, morphine injection, nitroGLYCERIN, ondansetron **OR** ondansetron (ZOFRAN) IV, promethazine **OR** promethazine (PHENERGAN) injection (IM or  IVPB) **OR** promethazine, senna-docusate, sodium phosphate, Surgifoam 1 Gm with Thrombin 5,000 units (5 ml) topical solution, traZODone  Physical Exam Pulmonary:     Comments: On high flow cannula.  Neurological:     Mental Status: She is alert.            Vital Signs: BP (!) 180/81   Pulse 96   Temp 97.6 F (36.4 C) (Oral)   Resp (!) 23   Ht 5\' 5"  (1.651 m)   Wt 102 kg   SpO2 98%   BMI 37.42 kg/m  SpO2: SpO2: 98 % O2 Device: O2 Device: High Flow Nasal Cannula O2 Flow Rate: O2 Flow Rate (L/min): 50 L/min  Intake/output summary:  Intake/Output Summary (Last 24 hours) at 01/26/2021 1008 Last data filed at 01/26/2021 0800 Gross per 24 hour  Intake 594.95 ml  Output 1931 ml  Net -1336.05 ml   LBM: Last BM Date: 01/25/21 Baseline Weight: Weight: 106.6 kg Most recent weight: Weight: 102 kg        Patient Active Problem List   Diagnosis Date Noted  . Hypoxia   . Shortness of breath   . Pre-op evaluation   . Abdominal aortic aneurysm (AAA) without rupture (HCC)   . Obesity, Class III, BMI 40-49.9 (morbid obesity) (HCC)   . Abscess of left hip 01/15/2021  . Acute respiratory failure with hypoxia (HCC) 10/05/2020  . Severe aortic stenosis   . Closed comminuted intertrochanteric fracture of proximal end of left femur (HCC) 10/02/2020  . Aortic stenosis   . Obesity    . Cellulitis of left lower extremity 04/29/2018  . GI bleed 06/05/2017  . Nausea vomiting and diarrhea   . Acute ST elevation myocardial infarction (STEMI) of inferior wall (HCC) 06/02/2017  . CAD (coronary artery disease) 2018    Palliative Care Assessment & Plan   Recommendations/Plan: Continue current care. DNR/DNI, no feeding tube.  PMT will follow along.     Code Status:    Code Status Orders  (From admission, onward)           Start     Ordered   01/23/21 1543  Do not attempt resuscitation (DNR)  Continuous       Question Answer Comment  In the event of cardiac or respiratory ARREST Do not call a "code blue"   In the event of cardiac or respiratory ARREST Do not perform Intubation, CPR, defibrillation or ACLS   In the event of cardiac or respiratory ARREST Use medication by any route, position, wound care, and other measures to relive pain and suffering. May use oxygen, suction and manual treatment of airway obstruction as needed for comfort.   Comments MOST form on chart.      01/23/21 1542           Code Status History     Date Active Date Inactive Code Status Order ID Comments User Context   01/15/2021 2309 01/23/2021 1542 Full Code 01/25/2021  [id number], MD Inpatient   10/02/2020 1117 10/09/2020 2150 Full Code 2151  [id number], MD ED   04/30/2018 1018 05/02/2018 1808 DNR 05/04/2018  [id number], MD Inpatient   04/29/2018 1732 04/30/2018 1018 Full Code 05/02/2018  [id number], MD Inpatient   06/05/2017 0521 06/07/2017 1404 Full Code 06/09/2017  [id number], MD Inpatient   06/02/2017 0059 06/04/2017 1932 Full Code 06/06/2017  [id number], MD Inpatient      Advance Directive Documentation    Flowsheet Row Most Recent  Value  Type of Advance Directive Healthcare Power of Attorney, Living will  Pre-existing out of facility DNR order (yellow form or pink MOST form) --  "MOST" Form in Place? --       Prognosis: Poor overall   Care  plan was discussed with RN  Thank you for allowing the Palliative Medicine Team to assist in the care of this patient.       Total Time 35 min Prolonged Time Billed  no       Greater than 50%  of this time was spent counseling and coordinating care related to the above assessment and plan.  Morton Stall, NP  Please contact Palliative Medicine Team phone at [phone] for questions and concerns.

## 2021-01-26 NOTE — Progress Notes (Signed)
[facility]  Daily Progress Note   Subjective  -   Patient remains on high flow nasal cannula oxygen, but otherwise is doing well.  Saturations have been adequate on this.  Still somewhat tachypneic.  No abdominal pain.  Access sites are healing normally.  Objective Vitals:   01/26/21 1000 01/26/21 1100 01/26/21 1230 01/26/21 1300  BP: (!) 177/83 (!) 183/78 (!) 170/86 (!) 153/76  Pulse: (!) 101 (!) 103 (!) 107 (!) 106  Resp: 19 (!) 22 (!) 27 (!) 26  Temp:      TempSrc:      SpO2: 94% 96% 95% 94%  Weight:      Height:        Intake/Output Summary (Last 24 hours) at 01/26/2021 1524 Last data filed at 01/26/2021 1200 Gross per 24 hour  Intake 658.09 ml  Output 2136 ml  Net -1477.91 ml    PULM  CTAB CV  RRR VASC  access sites are all clean, dry, and intact with mild bruising  Laboratory CBC    Component Value Date/Time   WBC 8.7 01/26/2021 0917   HGB 11.8 (L) 01/26/2021 0917   HCT 37.8 01/26/2021 0917   PLT 165 01/26/2021 0917    BMET    Component Value Date/Time   NA 136 01/26/2021 0917   K 3.9 01/26/2021 0917   CL 96 (L) 01/26/2021 0917   CO2 29 01/26/2021 0917   GLUCOSE 130 (H) 01/26/2021 0917   BUN 37 (H) 01/26/2021 0917   CREATININE 1.16 (H) 01/26/2021 0917   CALCIUM 8.5 (L) 01/26/2021 0917   GFRNONAA 46 (L) 01/26/2021 0917   GFRAA >60 05/02/2018 0336    Assessment/Planning: POD #2 s/p complex aneurysm repair with snorkel and periscope technique  Renal function is stable Doing well from a vascular standpoint, but cardiopulmonary status is still tenuous No further recs from vascular standpoint at this point. Can follow-up in the office in 3 to 4 weeks with EVAR duplex   Festus Barren  01/26/2021, 3:24 PM

## 2021-01-26 NOTE — Progress Notes (Signed)
OT Cancellation Note  Patient Details Name: Monica [name] MRN: [id number] DOB: 1933/10/08   Cancelled Treatment:    Reason Eval/Treat Not Completed: Medical issues which prohibited therapy. Chart reviewed. Pt recently noted with low SpO2, concern as to HHFNC vs BiPAP. Will hold OT evaluation and re-attempt at later date/time as pt is more medically appropriate for therapy and exertional activity.   Wynona Canes, MPH, MS, OTR/L ascom [phone] 01/26/21, 4:14 PM

## 2021-01-26 NOTE — Progress Notes (Signed)
Patient taken off of Bipap and placed on HHFNC at this time due to nausea. Tolerating well at this time

## 2021-01-26 NOTE — Progress Notes (Signed)
PT Cancellation Note  Patient Details Name: Monica [name] MRN: [id number] DOB: 1934/07/07   Cancelled Treatment:    Reason Eval/Treat Not Completed: Patient not medically ready PT orders received, chart reviewed. Arrived to pt's room with pt noted to have O2 sats 61-67% on HHFNC with MD in room strongly urging pt to wear bipap. Pt not appropriate for PT intervention at this time. Will f/u as able & as pt is appropriate to participate.  Aleda Grana, PT, DPT 01/26/21, 3:56 PM    [name] 01/26/2021, 3:55 PM

## 2021-01-26 NOTE — Progress Notes (Signed)
Progress Note  Patient Name: Monica [name] Date of Encounter: 01/26/2021  [facility] Cardiologist: Lorine Bears, MD   Subjective   Grandson at the bedside Extubated past 24 hours, Significant cough, difficulty clearing her secretions Weak, in bed  Inpatient Medications    Scheduled Meds:  Chlorhexidine Gluconate Cloth  6 each Topical Daily   docusate  100 mg Per Tube BID   enoxaparin (LOVENOX) injection  40 mg Subcutaneous Q24H   feeding supplement  237 mL Oral BID BM   ipratropium-albuterol  3 mL Nebulization Q6H   methylPREDNISolone (SOLU-MEDROL) injection  60 mg Intravenous Q12H   multivitamin with minerals  1 tablet Oral Daily   mupirocin ointment  1 application Nasal BID   pantoprazole (PROTONIX) IV  40 mg Intravenous Daily   polyethylene glycol  17 g Per Tube Daily   rifampin  300 mg Oral BID WC   rosuvastatin  10 mg Oral Daily   vancomycin variable dose per unstable renal function (pharmacist dosing)   Does not apply See admin instructions   Continuous Infusions:  sodium chloride 10 mL/hr at 01/26/21 1200   methocarbamol (ROBAXIN) IV     promethazine (PHENERGAN) injection (IM or IVPB) Stopped (01/26/21 0431)   PRN Meds: bisacodyl, diazepam, guaiFENesin-dextromethorphan, heparin 5000 units in normal saline injection (flush), ipratropium-albuterol, lip balm, methocarbamol **OR** methocarbamol (ROBAXIN) IV, metoCLOPramide **OR** metoCLOPramide (REGLAN) injection, morphine injection, nitroGLYCERIN, ondansetron **OR** ondansetron (ZOFRAN) IV, promethazine **OR** promethazine (PHENERGAN) injection (IM or IVPB) **OR** promethazine, senna-docusate, sodium phosphate, Surgifoam 1 Gm with Thrombin 5,000 units (5 ml) topical solution, traZODone   Vital Signs    Vitals:   01/26/21 1000 01/26/21 1100 01/26/21 1230 01/26/21 1300  BP: (!) 177/83 (!) 183/78 (!) 170/86 (!) 153/76  Pulse: (!) 101 (!) 103 (!) 107 (!) 106  Resp: 19 (!) 22 (!) 27 (!) 26  Temp:      TempSrc:       SpO2: 94% 96% 95% 94%  Weight:      Height:        Intake/Output Summary (Last 24 hours) at 01/26/2021 1440 Last data filed at 01/26/2021 1200 Gross per 24 hour  Intake 658.09 ml  Output 2136 ml  Net -1477.91 ml   Last 3 Weights 01/24/2021 01/15/2021 10/02/2020  Weight (lbs) 224 lb 13.9 oz 235 lb 240 lb  Weight (kg) 102 kg 106.595 kg 108.863 kg      Telemetry    Normal sinus rhythm- Personally Reviewed  ECG     - Personally Reviewed  Physical Exam   GEN: Short breath, coughing, significant secretions Neck: No JVD Cardiac: RRR, no murmurs, rubs, or gallops.  Respiratory: Coarse breath sounds bilaterally GI: Soft, nontender, non-distended  MS: No edema; No deformity. Neuro:  Nonfocal  Psych: Normal affect   Labs    High Sensitivity Troponin:   Recent Labs  Lab 01/20/21 1336 01/20/21 1515  TROPONINIHS 11 12      Chemistry Recent Labs  Lab 01/23/21 0426 01/24/21 0414 01/25/21 0534 01/26/21 0917  NA 134* 133* 135 136  K 3.5 3.2* 4.6 3.9  CL 89* 90* 95* 96*  CO2 33* 33* 31 29  GLUCOSE 113* 92 121* 130*  BUN 22 25* 29* 37*  CREATININE 0.77 0.74 0.93  1.02* 1.16*  CALCIUM 8.8* 8.6* 8.5* 8.5*  PROT 7.6  --   --   --   ALBUMIN 3.0*  --   --   --   AST 26  --   --   --  ALT 11  --   --   --   ALKPHOS 60  --   --   --   BILITOT 1.9*  --   --   --   GFRNONAA >60 >60 >60  54* 46*  ANIONGAP 12 10 9 11      Hematology Recent Labs  Lab 01/24/21 0414 01/25/21 0534 01/26/21 0917  WBC 4.2 7.4 8.7  RBC 5.44* 4.89 4.95  HGB 12.9 11.6* 11.8*  HCT 41.8 37.7 37.8  MCV 76.8* 77.1* 76.4*  MCH 23.7* 23.7* 23.8*  MCHC 30.9 30.8 31.2  RDW 19.9* 19.4* 19.9*  PLT 251 180 165    BNP Recent Labs  Lab 01/21/21 0451 01/22/21 1640  BNP 223.1* 146.7*     DDimer  Recent Labs  Lab 01/20/21 1336  DDIMER 3.53*     Radiology    DG Abd 1 View  Result Date: 01/24/2021 CLINICAL DATA:  NGT placement EXAM: ABDOMEN - 1 VIEW COMPARISON:  CT 01/21/2021  FINDINGS: Enteric tube has been placed with tip in the right upper quadrant consistent with location in the distal stomach. Scattered gas and stool in the colon. Gas-filled small bowel are present. No small or large bowel distention. Curvilinear calcifications consistent with abdominal aortic aneurysm. Since the prior CT, an aortic stent graft is been placed. IMPRESSION: Enteric tube tip in the right upper quadrant consistent with location in the distal stomach. Aortic aneurysm post stent graft repair. Electronically Signed   By: 03/23/2021 M.D.   On: 01/24/2021 18:12   DG Chest Port 1 View  Result Date: 01/24/2021 CLINICAL DATA:  Intubation. EXAM: PORTABLE CHEST 1 VIEW COMPARISON:  Chest x-ray dated January 21, 2021. FINDINGS: Endotracheal tube tip at the thoracic inlet approximately 4.9 cm above the carina. Right internal jugular central venous catheter in the proximal right atrium. Stable cardiomegaly. Chronic diffuse coarse interstitial thickening related to known pulmonary fibrosis. No focal consolidation, pleural effusion, or pneumothorax. No acute osseous abnormality. IMPRESSION: 1. Appropriately positioned endotracheal tube and right internal jugular central venous catheter. 2. Pulmonary fibrosis without superimposed acute process. Electronically Signed   By: January 23, 2021 M.D.   On: 01/24/2021 15:23    Cardiac Studies     Patient Profile       Assessment & Plan    AAA repair repair Over 7 cm in size   Successful endovascular repair   Acute on chronic respiratory distress  Multifactorial including pulmonary fibrosis, severe aortic valve stenosis, CHF ,  -Now extubated Significant coughing, sputum production, secretions that are thick High risk of aspiration event, bronchitis/pneumonia Will defer management to ICU team  Severe aortic valve stenosis   -Currently weak, respiratory distress, recently extubated past 24 hours, not a good candidate for cardiac work-up for  TAVR -Outpatient work-up can be arranged at a later date, unclear if she will be a candidate given profound debility, left hip infection, severe COPD, pulmonary hypertension     Shortness of breath   Multifactorial pulmonary fibrosis, bronchitis, CHF Severe aortic valve stenosis,  -Lasix held for renal dysfunction, high risk of ATN given recent contrast/AAA repair  Postsurgical hip infection   Status postdebridement in OR June 7 with intraoperative cultures growing MRSA blood cultures negative x2       Long discussion with patient and grandson at the bedside concerning the above, no arrangements at this time for TAVR work-up but can be made in the future depending on her recovery  Total encounter time more than 35 minutes  Greater than 50% was spent in counseling and coordination of care with the patient   For questions or updates, please contact [facility] Please consult [email or web address] for contact info under        Signed, Julien Nordmann, MD  01/26/2021, 2:40 PM

## 2021-01-26 NOTE — Progress Notes (Signed)
NAME:  Monica ArmourSylvia [name], MRN:  [id number], DOB:  April 20, 1934, LOS: 11 ADMISSION DATE:  01/15/2021, CONSULTATION DATE:  01/24/2021 REFERRING MD:  Dr. Wyn Quakerew, CHIEF COMPLAINT:  Post-op Respiratory Failure   Brief patient description/synopsis:  87 y.o. Female admitted with post-op MRSA infection of left hip.  Incidentally found to have large AAA, status post endovascular repair on 01/24/21.  Returns to ICU post procedure and remains intubated.  History of Present Illness:  Monica [name] is a 87 y.o. Female with a past medical history significant for  CAD s/p PCI x1 in 2018, severe AS being worked up for TAVR, Obesity, pHTN, and COPD who [facility] ED on 6/6/200 with left hip pain, swelling, erythema, tenderness, and warmth.   She had hip fracture after fall in Feb, underwent left hip intramedullary nailing, and was discharged home.  Since being home 4 months ago, really never able to walk due to pain. Finally, in the last week before admission, developed redness and worse pain at left hip surgical site, then wound dehisced.  ED COURSE: Evaluation in the ED showed a subcutaneous fluid collection on CT Scan.  Additionally CT scan showed a large infrarenal aortic aneurysm. Orthopedics and Vascular Surgery was consulted. Cultures of the left hip wound was positive for MRSA.    She was admitted for further workup and treatment of post-surgical hip infection with MRSA.    Pertinent  Medical History  Aortic Stenosis Coronary Artery Disease Hyperlipidemia Recent Left Hip Fracture in February 2022  Micro Data:  01/15/2021: SARS-CoV-2 and influenza PCR>> negative 01/15/2021: Blood culture>>STAPHYLOCOCCUS EPIDERMIDIS (suspected contaminant) 01/15/2021: Repeat blood culture>> no growth 01/16/2021: Left hip wound abscess>> MRSA 01/12/2021: Blood culture x2>> no growth  Antimicrobials:  Cefazolin 6/15 x 1 dose (surgical prophylaxis) Cefepime 6/7>> 6/9 Zosyn 6/6>> 6/6 Rifampin 6/7>> Vancomycin 6/6>> P.o.  fluconazole 6/10>> 6/10  Significant Hospital Events: Including procedures, antibiotic start and stop dates in addition to other pertinent events   01/15/2021: Admitted to Med-surg; Orthopedics, Vascular Surgery consulted 01/16/2021: Underwent irrigation and debridement of left hip wound 01/24/2021: Status post endovascular repair of AAA, returns to ICU and remains intubated postop 01/26/21- patient had mild resp distress and increased O2 requirement. She remains on HFNC/BIPAP, she failed SLP, wound vac in place with no activity.    Objective   Blood pressure (!) 180/81, pulse 96, temperature 97.6 F (36.4 C), temperature source Oral, resp. rate (!) 23, height 5\' 5"  (1.651 m), weight 102 kg, SpO2 98 %.    FiO2 (%):  [35 %-93 %] 60 %   Intake/Output Summary (Last 24 hours) at 01/26/2021 1047 Last data filed at 01/26/2021 0800 Gross per 24 hour  Intake 594.95 ml  Output 1931 ml  Net -1336.05 ml    Filed Weights   01/15/21 1638 01/24/21 1500  Weight: 106.6 kg 102 kg    Examination: General: Acute on chronically ill-appearing female, laying in bed, intubated sedated, no acute distress HENT: Atraumatic, normocephalic, neck supple, no JVD Lungs: Mechanical breath sounds bilaterally, even, occasional breaths over the ventilator Cardiovascular: Regular rate and rhythm, + murmur Abdomen: Obese, soft, nontender, nondistended, no guarding or rebound tenderness Extremities: Bilateral lower extremities: Dusky in appearance Neuro: Lethargic/lightly sedated (just arrived from PACU), withdraws from pain, pupils PERRLA sluggish 2 mm bilaterally GU: Foley catheter in place draining yellow urine Skin: Bilateral PAD's is in place clean dry and intact Chest x-ray 6/15>>1. Appropriately positioned endotracheal tube and right internal jugular central venous catheter. 2. Pulmonary fibrosis without superimposed acute  process. CT Left Hip 6/6>>1. Status post ORIF of a comminuted intertrochanteric fracture  of the left femur with displaced greater and lesser trochanteric fracture fragments, grossly similar alignment compared to intraoperative fluoroscopic images. No bridging bone formation across the fracture site. 2. Small fluid collection within the subcutaneous soft tissues, likely along a surgical incision site the level of the left iliac wing measuring approximately 4.1 x 0.8 x 3.2 cm. Findings may represent a postoperative seroma or hematoma. Infected fluid collection would be difficult to exclude by imaging alone. 3. Partially visualized infrarenal abdominal aortic aneurysm with extensive mural thrombus measuring approximately 6.7 x 6.1 cm. Recommend referral to a vascular specialist. This recommendation follows ACR consensus guidelines: White Paper of the ACR Incidental Findings Committee II on Vascular Findings. J Am Coll Radiol 2013; 10:789-794. 4. Proximal right common iliac artery aneurysm measuring approximately 2.5 cm CTA Chest/Abdomen/Pelvis 6/8>>1. Infrarenal abdominal aortic aneurysm measuring up to 7.7 x 7.6 cm. Recommend referral to a vascular specialist. This recommendation follows ACR consensus guidelines: White Paper of the ACR Incidental Findings Committee II on Vascular Findings. J Am Coll Radiol 2013; 10:789-794. 2. 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 3. 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 4. 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 5. Mediastinal and hilar lymphadenopathy similar to prior examination. This may be due to inflammation or malignancy. 6. Incompletely characterized 1.2 cm mass at the inferior tip of the left kidney should be further evaluated with contrast enhanced CT or MRI of the abdomen on nonemergent basis. 7. 5 mm right upper lobe pulmonary nodule is unchanged since prior exam. No follow-up needed if patient is low-risk.  Non-contrast chest CT can be considered in 12 months if patient is high-risk. This recommendation follows the consensus statement: Guidelines for Management of Incidental Pulmonary Nodules Detected on CT Images: From the Fleischner Society 2017; Radiology 2017; 284:228-243. CTA Chest 6/11>>No evidence of pulmonary embolus. Mild cardiomegaly.  Evidence of pulmonary arterial hypertension. Peripheral interstitial thickening and ground-glass opacities likely reflects fibrosis. Dependent and bibasilar atelectasis or scarring. Stable mediastinal and bilateral hilar adenopathy. Coronary artery disease. Cholelithiasis. Aortic Atherosclerosis CTA Abdomen 6/12>>1. Stable 7.8 x 7.6 cm infrarenal abdominal aortic aneurysm. No evidence for acute rupture. 2. Stable right common iliac artery aneurysm with maximum diameter of 2.7 cm. 3. Stable advanced atherosclerotic calcifications involving the aorta and branch vessels. 4. Stable significant chronic lung disease and mediastinal adenopathy. 5. Stable cholelithiasis. 6. Stable 13 mm lower pole left renal lesion. As before recommend follow-up MRI abdomen for further evaluation.   Aortic Atherosclerosis  Assessment & Plan:   Post-Op Respiratory Failure Acute on Chronic Hypoxic Respiratory Failure due to Aortic Stenosis, CHF, Pulmonary HTN, and Pulmonary Fibrosis -extubated 01/25/21 -Implement VAP bundle -As needed bronchodilators -Continue Solu-Medrol BID -CT angiogram of the chest with patchy subpleural fibrosis, no PE, moderate atelectasis, no edema or large consolidation, no effusion.  Chronic HFpEF PMHx of Severe Aortic Stenosis, CAD -Continuous cardiac monitoring -Maintain MAP greater than 65 -Continue midodrine -Diuresis as blood pressure and renal function permits -Cardiology following, appreciate input -Plan for TAVR workup  8.2 cm Juxtarenal Aortic Aneurysm status post endovascular repair on 01/24/2021 -Vascular surgery  following  Postsurgical MRSA Hip Infection s/p Debridement of Left Hip wound on 01/16/21 -Monitor fever curve -Trend WBC's -Follow cultures as above -1/2 blood cultures + for staph epidermidis which was felt to be  comtaminant -ID following, appreciate input -ABX as per ID, currently on Vancomycin & Rifampin (recommends 4 weeks of treatment)    Pt with multiple co-morbidities, long term prognosis is very poor.  Anticipate difficult wean from ventilator.  Palliative Care is following for goals of care.    Best practice (right click and "Reselect all SmartList Selections" daily)  Diet:  NPO Pain/Anxiety/Delirium protocol (if indicated): Yes (RASS goal -1) VAP protocol (if indicated): Yes DVT prophylaxis: LMWH GI prophylaxis: PPI Glucose control:  SSI No Central venous access:  N/A Arterial line:  N/A Foley:  Yes, and it is still needed Mobility:  bed rest  PT consulted: N/A Last date of multidisciplinary goals of care discussion [N/A] Code Status:  DNR Disposition: ICU  Labs   CBC: Recent Labs  Lab 01/22/21 0452 01/23/21 0426 01/24/21 0414 01/25/21 0534 01/26/21 0917  WBC 6.6 5.7 4.2 7.4 8.7  HGB 12.0 12.9 12.9 11.6* 11.8*  HCT 38.7 40.2 41.8 37.7 37.8  MCV 76.0* 74.2* 76.8* 77.1* 76.4*  PLT 296 287 251 180 165     Basic Metabolic Panel: Recent Labs  Lab 01/22/21 0452 01/23/21 0426 01/24/21 0414 01/25/21 0534 01/26/21 0917  NA 135 134* 133* 135 136  K 3.9 3.5 3.2* 4.6 3.9  CL 93* 89* 90* 95* 96*  CO2 35* 33* 33* 31 29  GLUCOSE 103* 113* 92 121* 130*  BUN 19 22 25* 29* 37*  CREATININE 0.74 0.77 0.74 0.93  1.02* 1.16*  CALCIUM 8.6* 8.8* 8.6* 8.5* 8.5*    GFR: Estimated Creatinine Clearance: 42 mL/min (A) (by C-G formula based on SCr of 1.16 mg/dL (H)). Recent Labs  Lab 01/23/21 0426 01/24/21 0414 01/25/21 0534 01/26/21 0917  WBC 5.7 4.2 7.4 8.7     Liver Function Tests: Recent Labs  Lab 01/23/21 0426  AST 26  ALT 11  ALKPHOS 60  BILITOT  1.9*  PROT 7.6  ALBUMIN 3.0*    No results for input(s): LIPASE, AMYLASE in the last 168 hours. No results for input(s): AMMONIA in the last 168 hours.  ABG    Component Value Date/Time   PHART 7.49 (H) 01/25/2021 1155   PCO2ART 44 01/25/2021 1155   PO2ART 95 01/25/2021 1155   HCO3 33.5 (H) 01/25/2021 1155   O2SAT 97.9 01/25/2021 1155      Coagulation Profile: No results for input(s): INR, PROTIME in the last 168 hours.  Cardiac Enzymes: No results for input(s): CKTOTAL, CKMB, CKMBINDEX, TROPONINI in the last 168 hours.  HbA1C: Hgb A1c MFr Bld  Date/Time Value Ref Range Status  06/02/2017 06:15 AM 5.7 (H) 4.8 - 5.6 % Final    Comment:    (NOTE) Pre diabetes:          5.7%-6.4% Diabetes:              >6.4% Glycemic control for   <7.0% adults with diabetes     CBG: Recent Labs  Lab 01/24/21 1515  GLUCAP 141*     Review of Systems:   Unable to assess due to Intubation/sedation  Past Medical History:  She,  has a past medical history of Aortic stenosis, CAD (coronary artery disease) (2018), GI bleed, HLD (hyperlipidemia), and Obesity.   Surgical History:   Past Surgical History:  Procedure Laterality Date   ANKLE RECONSTRUCTION  1956   also ORIF of right arm   CORONARY STENT INTERVENTION N/A 06/01/2017   Procedure: Coronary/Graft Acute MI Revascularization;  Surgeon: Iran Ouch, MD;  [facility]  INVASIVE CV LAB;  Service: Cardiovascular;  Laterality: N/A;   ENDOVASCULAR REPAIR/STENT GRAFT N/A 01/24/2021   Procedure: ENDOVASCULAR REPAIR/STENT GRAFT;  Surgeon: Renford Dills, MD;  Location: [facility] INVASIVE CV LAB;  Service: Cardiovascular;  Laterality: N/A;   INCISION AND DRAINAGE HIP Left 01/16/2021   Procedure: IRRIGATION AND DEBRIDEMENT HIP;  Surgeon: Signa Kell, MD;  Location: [facility] ORS;  Service: Orthopedics;  Laterality: Left;   INTRAMEDULLARY (IM) NAIL INTERTROCHANTERIC Left 10/03/2020   Procedure: INTRAMEDULLARY (IM) NAIL INTERTROCHANTRIC;   Surgeon: Signa Kell, MD;  Location: [facility] ORS;  Service: Orthopedics;  Laterality: Left;   LEFT HEART CATH AND CORONARY ANGIOGRAPHY N/A 06/01/2017   Procedure: LEFT HEART CATH AND CORONARY ANGIOGRAPHY;  Surgeon: Iran Ouch, MD;  Location: [facility] INVASIVE CV LAB;  Service: Cardiovascular;  Laterality: N/A;     Social History:   reports that she quit smoking about 3 years ago. Her smoking use included cigarettes. She has a 25.00 pack-year smoking history. She has never used smokeless tobacco. She reports that she does not drink alcohol and does not use drugs.   Family History:  Her family history includes Diabetes Mellitus II in her daughter; Valvular heart disease in her mother.   Allergies No Known Allergies   Home Medications  Prior to Admission medications   Medication Sig Start Date End Date Taking? Authorizing Provider  aspirin 81 MG EC tablet Take 81 mg by mouth daily.   Yes [provider]  carvedilol (COREG) 3.125 MG tablet Take 3.125 mg by mouth 2 (two) times daily with a meal.   Yes [provider]  oxyCODONE (OXY IR/ROXICODONE) 5 MG immediate release tablet Take 5 mg by mouth every 4 (four) hours as needed for severe pain.   Yes [provider]  rosuvastatin (CRESTOR) 10 MG tablet Take 1 tablet by mouth once daily Patient taking differently: Take 10 mg by mouth daily. 07/24/20  Yes Iran Ouch, MD  oxyCODONE (OXY IR/ROXICODONE) 5 MG immediate release tablet Take 1-2 tablets (5-10 mg total) by mouth every 4 (four) hours as needed for moderate pain (pain score 4-6). 01/19/21   Anson Oregon, PA-C     Critical care provider statement:    Critical care time (minutes):  33   Critical care time was exclusive of:  Separately billable procedures and  treating other patients   Critical care was necessary to treat or prevent imminent or  life-threatening deterioration of the following conditions:  acute hypoxemic respiratry failure, CHF, AAA  s/p repair, PJI   Critical care was time spent personally by me on the following  activities:  Development of treatment plan with patient or surrogate,  discussions with consultants, evaluation of patient's response to  treatment, examination of patient, obtaining history from patient or  surrogate, ordering and performing treatments and interventions, ordering  and review of laboratory studies and re-evaluation of patient's condition   I assumed direction of critical care for this patient from another  provider in my specialty: no         Vida Rigger, M.D.  Pulmonary & Critical Care Medicine  [facility]

## 2021-01-26 NOTE — Progress Notes (Signed)
Pharmacy Antibiotic Note  Monica [name] is a 87 y.o. female admitted on 01/15/2021 with infected hip following IM nail in February.  Pharmacy has been consulted for Vancomycin Dosing. S/p debridement of the left hip wound by  6/7 with MRSA from OR cultures.  Also on rifampin for presence of foreign material/hardware.    Today, 01/26/2021 Day #11 vancomycin and Day #8 rifampin Renal: SCr = 1.16 - small trend upward, making urine AAA repair performed 6/15 WBC WNL 6/16 Tm 100 LFTs WNL on 6/13 Vancomycin trough 6/16 @ 2058 = 21 mcg/ml Vancomycin dose not given last evening Vancomycin random level  6/17 @ 0417 = 15 mcg/ml Since no give given between levels, Ke = 0.0463 for half-life = 15hr  Plan: Vanocmycin 1gm IV x 1 today and continue to monitor SCr prior to starting routine dose.  Will consider ordering a random vancomycin level with morning labs Goal AUC 400-600 Monitor renal function - Scr increasing (mild),  ? Related to contrast Monitor LFTs weekly  Plan to check vancomycin trough within 48h   Height: 5\' 5"  (165.1 cm) Weight: 102 kg (224 lb 13.9 oz) IBW/kg (Calculated) : 57  Temp (24hrs), Avg:99.1 F (37.3 C), Min:98.6 F (37 C), Max:100 F (37.8 C)  Recent Labs  Lab 01/20/21 0132 01/20/21 2113 01/21/21 0451 01/22/21 0452 01/23/21 0426 01/24/21 0414 01/25/21 0534 01/25/21 2058 01/26/21 0417  WBC 9.5  --  5.8 6.6 5.7 4.2 7.4  --   --   CREATININE 0.82  --  0.73 0.74 0.77 0.74 0.93  1.02*  --   --   VANCOTROUGH  --  16  --   --   --   --   --  21*  --   VANCOPEAK 19*  --   --   --   --   --   --   --   --   VANCORANDOM  --   --   --   --   --   --   --   --  15     Estimated Creatinine Clearance: 47.7 mL/min (A) (by C-G formula based on SCr of 1.02 mg/dL (H)).    No Known Allergies  Antimicrobials this admission: zosyn x1 dose 6/6 cefepime 6/7>>6/9 Vanc 6/6 (evening)>> Rifampin 6/10>>   Microbiology results:  6/6 BCx: 2of4 (1 set)= GPC=Staph epi, sens  pending 6/7 Wound x 3: MRSA 6/8 Bcx: NG  Thank you for allowing pharmacy to be a part of this patient's care.  8/8, PharmD, BCPS.   Work Cell: [phone] 01/26/2021 8:07 AM

## 2021-01-26 NOTE — Evaluation (Signed)
Clinical/Bedside Swallow Evaluation Patient Details  Name: Monica [name] MRN: [id number] Date of Birth: 1933/09/10  Today's Date: 01/26/2021 Time: SLP Start Time (ACUTE ONLY): 1315 SLP Stop Time (ACUTE ONLY): 1335 SLP Time Calculation (min) (ACUTE ONLY): 20 min  Past Medical History:  Past Medical History:  Diagnosis Date   Aortic stenosis    a. LHC 06/02/17: At least moderate aortic stenosis with a peak to peak gradient of 22 mmHg; b. TTE 05/2017: EF 55-60%, mild HK basal-midinferior wall, Gr1DD, mod to sev AS w/ mean gradient 21 mmHg, valve area 0.99, mild MR, mildly dilated LA    CAD (coronary artery disease) 2018   a. inferior STEMI 06/02/2017: LHC 06/02/17: LM 20, D1 20%, o-pLCx 50, p-mRCA 100% s/p PCI/DES, mRCA 50, dRCA 30   GI bleed    a. noted 06/05/2017   HLD (hyperlipidemia)    Obesity    Past Surgical History:  Past Surgical History:  Procedure Laterality Date   ANKLE RECONSTRUCTION  1956   also ORIF of right arm   CORONARY STENT INTERVENTION N/A 06/01/2017   Procedure: Coronary/Graft Acute MI Revascularization;  Surgeon: Iran Ouch, MD;  Location: [facility] INVASIVE CV LAB;  Service: Cardiovascular;  Laterality: N/A;   ENDOVASCULAR REPAIR/STENT GRAFT N/A 01/24/2021   Procedure: ENDOVASCULAR REPAIR/STENT GRAFT;  Surgeon: Renford Dills, MD;  Location: [facility] INV[facility] CV LAB;  Service: Cardiovascular;  Laterality: N/A;   INCISION AND DRAINAGE HIP Left 01/16/2021   Procedure: IRRIGATION AND DEBRIDEMENT HIP;  Surgeon: Signa Kell, MD;  Location: [facility] ORS;  Service: Orthopedics;  Laterality: Left;   INTRAMEDULLARY (IM) NAIL INTERTROCHANTERIC Left 10/03/2020   Procedure: INTRAMEDULLARY (IM) NAIL INTERTROCHANTRIC;  Surgeon: Signa Kell, MD;  Location: [facility] ORS;  Service: Orthopedics;  Laterality: Left;   LEFT HEART CATH AND CORONARY ANGIOGRAPHY N/A 06/01/2017   Procedure: LEFT HEART CATH AND CORONARY ANGIOGRAPHY;  Surgeon: Iran Ouch, MD;  Location: [facility] INVASIVE CV  LAB;  Service: Cardiovascular;  Laterality: N/A;   HPI:  Monica [name] is a 87 y.o. Female with a past medical history significant for  CAD s/p PCI x1 in 2018, severe AS being worked up for TAVR, Obesity, pHTN, and COPD who presented to [facility] ED on 6/6/200 with left hip pain, swelling, erythema, tenderness, and warmth. She is post-op MRSA infection of left hip and endovascular repair on 01/24/2021. Pt had difficulty weaning from vent from surgery on 01/24/2021. She is current on HFNC.   Assessment / Plan / Recommendation Clinical Impression  Pt demonstrates adequate oropharyngeal abilities when consuming ice chips, thin liquids via cup and puree. Her oral phase appears timely with the appearance of swift swallow response. Her voice remained clear throughout as did her vitals. While pt states that she is not hungry, would recommend initial conservative diet of dysphagia 1 with thin liquids via cup, medicine crushed in puree, with strict aspiration precautions. ST to follow for safe diet advancement. SLP Visit Diagnosis: Dysphagia, oropharyngeal phase (R13.12)    Aspiration Risk  Mild aspiration risk    Diet Recommendation Dysphagia 1 (Puree);Thin liquid   Liquid Administration via: Cup Medication Administration: Crushed with puree Supervision: Patient able to self feed Compensations: Minimize environmental distractions;Slow rate;Small sips/bites Postural Changes: Seated upright at 90 degrees;Remain upright for at least 30 minutes after po intake    Other  Recommendations Oral Care Recommendations: Oral care BID   Follow up Recommendations  (TBD)      Frequency and Duration min 2x/week  2 weeks  Prognosis Prognosis for Safe Diet Advancement: Good      Swallow Study   General Date of Onset: 01/24/21 HPI: Monica [name] is a 87 y.o. Female with a past medical history significant for  CAD s/p PCI x1 in 2018, severe AS being worked up for TAVR, Obesity, pHTN, and COPD who presented to  [facility] ED on 6/6/200 with left hip pain, swelling, erythema, tenderness, and warmth. She is post-op MRSA infection of left hip and endovascular repair on 01/24/2021. Pt had difficulty weaning from vent from surgery on 01/24/2021. She is current on HFNC. Type of Study: Bedside Swallow Evaluation Previous Swallow Assessment: none in chart Diet Prior to this Study: NPO Temperature Spikes Noted: No Respiratory Status:  (HFNC) History of Recent Intubation: Yes Length of Intubations (days): 2 days Date extubated: 01/25/21 Behavior/Cognition: Alert;Cooperative;Pleasant mood;Requires cueing Oral Cavity Assessment: Within Functional Limits Oral Care Completed by SLP: Recent completion by staff Oral Cavity - Dentition: Adequate natural dentition Vision: Functional for self-feeding Self-Feeding Abilities: Needs assist Patient Positioning: Upright in bed Baseline Vocal Quality: Normal Volitional Cough: Strong Volitional Swallow: Able to elicit    Oral/Motor/Sensory Function Overall Oral Motor/Sensory Function: Within functional limits   Ice Chips Ice chips: Within functional limits Presentation: Spoon   Thin Liquid Thin Liquid: Within functional limits Presentation: Cup;Self Fed    Nectar Thick Nectar Thick Liquid: Not tested   Honey Thick Honey Thick Liquid: Not tested   Puree Puree: Within functional limits Presentation: Spoon   Solid    Monica [name] B. Dreama Saa M.S., CCC-SLP, [facility] Speech-Language Pathologist Rehabilitation Services Office [phone]  Solid: Not tested      [facility] 01/26/2021,3:53 PM

## 2021-01-26 NOTE — Progress Notes (Signed)
PROGRESS NOTE    Monica [name]  WVP:[id number] DOB: 1933/10/12 DOA: 01/15/2021 PCP: Patient, No Pcp Per (Inactive)   Brief Narrative:  Monica [name] is a 87 y.o. F with CAD s/p PCI x1 in 2018, severe AS being worked up for TAVR, Obesity, pHTN, and COPD who presented with hip pain.   Had hip fracture after fall in Feb, repaired and discharged home.  Since being home 4 months ago, really never able to walk due to pain. Finally, in the last week before admission, developed redness and worse pain at left hip surgical site, then wound dehisced.   Presented to the ER and Orthopedics were consulted for suspected post-surgical hip infection with MRSA.  ID is recommending 4 weeks of vancomycin and rifampin.    Also noted incidentally to have very large AAA, she was taken to the OR for endovascular repair with vascular surgery today.  Also had severe aortic stenosis and plan is for TAVR work-up after AAA repair, most likely as an outpatient if able to get out of hospital from current admission.  She will not be a good candidate due to multiple comorbidities.  Patient with multiple life-threatening comorbidities-high risk for deterioration and death, palliative care was also consulted, patient is now on DNR/DNI.  She will consider comfort care if deteriorates. At this time we will continue current level of care.  Patient had her endovascular AAA repair done with vascular surgery 2 days ago, she remained intubated and in ICU for 2 days after that, extubated yesterday and we will resume care again from today.  Remained on heated high flow, weak cough and appears very lethargic.  Subjective: Patient appears very lethargic when seen today.  She was on heated high flow, she was complaining of some stomach pain but there was no tenderness.  Assessment & Plan:   Active Problems:   Abscess of left hip   Abdominal aortic aneurysm (AAA) without rupture (HCC)   Obesity, Class III, BMI 40-49.9 (morbid obesity)  (HCC)   Pre-op evaluation   Shortness of breath   Hypoxia  Postsurgical hip infection S/p debridement of the left hip wound by Dr. Allena Katz, 6/7 Admitted and taken to OR 6/7 for washout.  Intraoperative cultures growing MRSA in 3/3. ID consulted, recommended treating with 4 weeks antibiotics for presumed MRSA prosthetic infection.  1/2 blood cultures positive for staph epidermidis which was thought to be a contaminant. 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  Patient was evaluated by vascular surgery, who recommended urgent repair given the size.  She is not a candidate for open repair.  She was taken to the OR for endovascular repair-tolerated the procedure well, stable from vascular standpoint and will follow-up as an outpatient. -Appreciate vascular help.  Severe aortic stenosis Previously known, since hip fracture in Feb.  Was pending TAVR eval prior to this admission, seen by Cardiology here.  -No plan to start TAVR work-up while she is in the hospital due to other comorbidities and life-threatening conditions.  Most likely will be done as an outpatient.    Pulmonary hypertension due to AS, CHF   Hypotension due to aortic stenosis -Continue midodrine   Chronic diastolic CHF -Defer diuretic to Cardiology  Acute on chronic hypoxic respiratory failure   Has chronic hypoxic respiratory failure due to aortic stenosis, heart failure, and pulmonary hypertension.  COPD is in chart but appears to be mild. Former smoker.  Uses 2  L of oxygen at baseline.  Initially requiring 2 to 3 L and then got worse requiring up to 10 L starting from 6/12. Chest x-ray unremarkable, CT angiogram of the chest with patchy subpleural fibrosis, no PE, moderate atelectasis, no edema or large consolidation, no effusion. Patient required prolonged intubation after the procedure, extubated yesterday but continued to require very high  levels of oxygen. - Consult to pulmonogy-appreciate their help - Continue Solumedrol BID for NSIP    Coronary artery disease, secondary prevention - Continue Crestor   Iron deficiency anemia Hgb stable Given Feraheme 6/10 -Transfusion threshold 8 g/dL  Hyponatremia.  Resolved Mild, asymptomatic  Vaginal candidiasis Given Fluconazole 1 dose on 6/10.   Left renal nodule Incidental finding, minor, given gravity of AAA and AS and hypoxia, this likely has not been discussed with patient and should be followed up only depending on goals of care after AAA repair - Follow-up MRI as an outpatient  Class II obesity. Estimated body mass index is 37.42 kg/m as calculated from the following:   Height as of this encounter: 5\' 5"  (1.651 m).   Weight as of this encounter: 102 kg.    Objective: Vitals:   01/26/21 1230 01/26/21 1300 01/26/21 1400 01/26/21 1500  BP: (!) 170/86 (!) 153/76 (!) 155/88 (!) 143/71  Pulse: (!) 107 (!) 106 (!) 105 (!) 109  Resp: (!) 27 (!) 26 (!) 29 (!) 22  Temp:      TempSrc:      SpO2: 95% 94% 94% (!) 77%  Weight:      Height:        Intake/Output Summary (Last 24 hours) at 01/26/2021 1747 Last data filed at 01/26/2021 1200 Gross per 24 hour  Intake 658.09 ml  Output 1136 ml  Net -477.91 ml    Filed Weights   01/15/21 1638 01/24/21 1500  Weight: 106.6 kg 102 kg    Examination:  General.  Lethargic elderly lady, in no acute distress. Pulmonary.  Decreased breath sound at bases, normal respiratory effort. CV.  Regular rate and rhythm, no JVD, rub or murmur. Abdomen.  Soft, nontender, nondistended, BS positive. CNS.  Alert and oriented .  No focal neurologic deficit. Extremities.  No edema, no cyanosis, pulses intact and symmetrical. Psychiatry.  Judgment and insight appears normal.   DVT prophylaxis: Lovenox Code Status: DNR Family Communication:  Disposition Plan:  Status is: Inpatient  Remains inpatient appropriate because:Inpatient level  of care appropriate due to severity of illness  Dispo: The patient is from:  Home              Anticipated d/c is to:  To be determined              Patient currently is not medically stable to d/c.   Difficult to place patient No                Level of care: Stepdown  All the records are reviewed and case discussed with Care Management/Social Worker. Management plans discussed with the patient, nursing and they are in agreement.  Consultants:  Vascular surgery Pulmonology Cardiology  Procedures:  Antimicrobials:  Vancomycin Rifampin  Data Reviewed: I have personally reviewed following labs and imaging studies  CBC: Recent Labs  Lab 01/22/21 0452 01/23/21 0426 01/24/21 0414 01/25/21 0534 01/26/21 0917  WBC 6.6 5.7 4.2 7.4 8.7  HGB 12.0 12.9 12.9 11.6* 11.8*  HCT 38.7 40.2 41.8 37.7 37.8  MCV 76.0* 74.2* 76.8* 77.1* 76.4*  PLT 296  287 251 180 165    Basic Metabolic Panel: Recent Labs  Lab 01/22/21 0452 01/23/21 0426 01/24/21 0414 01/25/21 0534 01/26/21 0917  NA 135 134* 133* 135 136  K 3.9 3.5 3.2* 4.6 3.9  CL 93* 89* 90* 95* 96*  CO2 35* 33* 33* 31 29  GLUCOSE 103* 113* 92 121* 130*  BUN 19 22 25* 29* 37*  CREATININE 0.74 0.77 0.74 0.93  1.02* 1.16*  CALCIUM 8.6* 8.8* 8.6* 8.5* 8.5*    GFR: Estimated Creatinine Clearance: 42 mL/min (A) (by C-G formula based on SCr of 1.16 mg/dL (H)). Liver Function Tests: Recent Labs  Lab 01/23/21 0426  AST 26  ALT 11  ALKPHOS 60  BILITOT 1.9*  PROT 7.6  ALBUMIN 3.0*    No results for input(s): LIPASE, AMYLASE in the last 168 hours. No results for input(s): AMMONIA in the last 168 hours. Coagulation Profile: No results for input(s): INR, PROTIME in the last 168 hours. Cardiac Enzymes: No results for input(s): CKTOTAL, CKMB, CKMBINDEX, TROPONINI in the last 168 hours. BNP (last 3 results) No results for input(s): PROBNP in the last 8760 hours. HbA1C: No results for input(s): HGBA1C in the last 72  hours. CBG: Recent Labs  Lab 01/24/21 1515  GLUCAP 141*   Lipid Profile: No results for input(s): CHOL, HDL, LDLCALC, TRIG, CHOLHDL, LDLDIRECT in the last 72 hours. Thyroid Function Tests: No results for input(s): TSH, T4TOTAL, FREET4, T3FREE, THYROIDAB in the last 72 hours. Anemia Panel: No results for input(s): VITAMINB12, FOLATE, FERRITIN, TIBC, IRON, RETICCTPCT in the last 72 hours. Sepsis Labs: No results for input(s): PROCALCITON, LATICACIDVEN in the last 168 hours.  Recent Results (from the past 240 hour(s))  CULTURE, BLOOD (ROUTINE X 2) w Reflex to ID Panel     Status: None   Collection Time: 01/17/21 12:10 PM   Specimen: BLOOD  Result Value Ref Range Status   Specimen Description BLOOD BLOOD RIGHT HAND  Final   Special Requests   Final    BOTTLES DRAWN AEROBIC AND ANAEROBIC Blood Culture results may not be optimal due to an excessive volume of blood received in culture bottles   Culture   Final    NO GROWTH 5 DAYS Performed at [facility], [address] [place], [place] [address]    Report Status 01/22/2021 FINAL  Final  CULTURE, BLOOD (ROUTINE X 2) w Reflex to ID Panel     Status: None   Collection Time: 01/17/21 12:13 PM   Specimen: BLOOD LEFT HAND  Result Value Ref Range Status   Specimen Description BLOOD LEFT HAND  Final   Special Requests   Final    BOTTLES DRAWN AEROBIC AND ANAEROBIC Blood Culture adequate volume   Culture   Final    NO GROWTH 5 DAYS Performed at [facility], [address] [place], [place] [address]    Report Status 01/22/2021 FINAL  Final      Radiology Studies: DG Abd 1 View  Result Date: 01/24/2021 CLINICAL DATA:  NGT placement EXAM: ABDOMEN - 1 VIEW COMPARISON:  CT 01/21/2021 FINDINGS: Enteric tube has been placed with tip in the right upper quadrant consistent with location in the distal stomach. Scattered gas and stool in the colon. Gas-filled small bowel are present. No small or large bowel distention.  Curvilinear calcifications consistent with abdominal aortic aneurysm. Since the prior CT, an aortic stent graft is been placed. IMPRESSION: Enteric tube tip in the right upper quadrant consistent with location in the  distal stomach. Aortic aneurysm post stent graft repair. Electronically Signed   By: Burman Nieves M.D.   On: 01/24/2021 18:12    Scheduled Meds:  Chlorhexidine Gluconate Cloth  6 each Topical Daily   docusate  100 mg Per Tube BID   enoxaparin (LOVENOX) injection  40 mg Subcutaneous Q24H   feeding supplement  237 mL Oral BID BM   ipratropium-albuterol  3 mL Nebulization Q6H   methylPREDNISolone (SOLU-MEDROL) injection  60 mg Intravenous Q12H   multivitamin with minerals  1 tablet Oral Daily   mupirocin ointment  1 application Nasal BID   pantoprazole (PROTONIX) IV  40 mg Intravenous Daily   polyethylene glycol  17 g Per Tube Daily   rifampin  300 mg Oral BID WC   rosuvastatin  10 mg Oral Daily   vancomycin variable dose per unstable renal function (pharmacist dosing)   Does not apply See admin instructions   Continuous Infusions:  sodium chloride 10 mL/hr at 01/26/21 1200   methocarbamol (ROBAXIN) IV     promethazine (PHENERGAN) injection (IM or IVPB) Stopped (01/26/21 0431)     LOS: 11 days   Time spent: 45 minutes More than 50% of the time was spent in counseling/coordination of care  Arnetha Courser, MD [facility]  If 7PM-7AM, please contact night-coverage [email or web address]  01/26/2021, 5:47 PM   This record has been created using Conservation officer, historic buildings. Errors have been sought and corrected,but may not always be located. Such creation errors do not reflect on the standard of care.

## 2021-01-27 DIAGNOSIS — R0902 Hypoxemia: Secondary | ICD-10-CM | POA: Diagnosis not present

## 2021-01-27 DIAGNOSIS — I714 Abdominal aortic aneurysm, without rupture: Secondary | ICD-10-CM | POA: Diagnosis not present

## 2021-01-27 DIAGNOSIS — L02416 Cutaneous abscess of left lower limb: Secondary | ICD-10-CM | POA: Diagnosis not present

## 2021-01-27 DIAGNOSIS — I350 Nonrheumatic aortic (valve) stenosis: Secondary | ICD-10-CM | POA: Diagnosis not present

## 2021-01-27 LAB — BASIC METABOLIC PANEL
Anion gap: 9 (ref 5–15)
BUN: 29 mg/dL — ABNORMAL HIGH (ref 8–23)
CO2: 28 mmol/L (ref 22–32)
Calcium: 8.2 mg/dL — ABNORMAL LOW (ref 8.9–10.3)
Chloride: 93 mmol/L — ABNORMAL LOW (ref 98–111)
Creatinine, Ser: 0.92 mg/dL (ref 0.44–1.00)
GFR, Estimated: >60 mL/min (ref >60)
Glucose, Bld: 117 mg/dL — ABNORMAL HIGH (ref 70–99)
Potassium: 3.9 mmol/L (ref 3.5–5.1)
Sodium: 130 mmol/L — ABNORMAL LOW (ref 135–145)

## 2021-01-27 LAB — CBC
HCT: 36.4 % (ref 36.0–46.0)
Hemoglobin: 11.3 g/dL — ABNORMAL LOW (ref 12.0–15.0)
MCH: 23.7 pg — ABNORMAL LOW (ref 26.0–34.0)
MCHC: 31 g/dL (ref 30.0–36.0)
MCV: 76.5 fL — ABNORMAL LOW (ref 80.0–100.0)
Platelets: 170 10*3/uL (ref 150–400)
RBC: 4.76 MIL/uL (ref 3.87–5.11)
RDW: 19.9 % — ABNORMAL HIGH (ref 11.5–15.5)
WBC: 8.9 10*3/uL (ref 4.0–10.5)
nRBC: 0 % (ref 0.0–0.2)

## 2021-01-27 LAB — MAGNESIUM: Magnesium: 2.4 mg/dL (ref 1.7–2.4)

## 2021-01-27 LAB — VANCOMYCIN, RANDOM: Vancomycin Rm: 17

## 2021-01-27 LAB — PHOSPHORUS: Phosphorus: 2.8 mg/dL (ref 2.5–4.6)

## 2021-01-27 LAB — TYPE AND SCREEN
ABO/RH(D): A POS
Antibody Screen: NEGATIVE
Unit division: 0
Unit division: 0

## 2021-01-27 LAB — BPAM RBC
Blood Product Expiration Date: 202207162359
Blood Product Expiration Date: 202207162359
Unit Type and Rh: 6200
Unit Type and Rh: 6200

## 2021-01-27 MED ORDER — FUROSEMIDE 10 MG/ML IJ SOLN
40.0000 mg | Freq: Once | INTRAMUSCULAR | Status: AC
  Administered 2021-01-27: 40 mg via INTRAVENOUS
  Filled 2021-01-27: qty 4

## 2021-01-27 MED ORDER — VANCOMYCIN HCL 1000 MG/200ML IV SOLN
1000.0000 mg | Freq: Once | INTRAVENOUS | Status: DC
  Filled 2021-01-27: qty 200

## 2021-01-27 MED ORDER — VANCOMYCIN HCL 1000 MG IV SOLR
1000.0000 mg | Freq: Once | INTRAVENOUS | Status: DC
  Filled 2021-01-27: qty 1000

## 2021-01-27 MED ORDER — VANCOMYCIN HCL 1000 MG/200ML IV SOLN
1000.0000 mg | Freq: Once | INTRAVENOUS | Status: AC
  Administered 2021-01-27: 1000 mg via INTRAVENOUS
  Filled 2021-01-27: qty 200

## 2021-01-27 MED ORDER — METHYLPREDNISOLONE SODIUM SUCC 40 MG IJ SOLR
40.0000 mg | Freq: Every day | INTRAMUSCULAR | Status: DC
  Administered 2021-01-28 – 2021-02-07 (×11): 40 mg via INTRAVENOUS
  Filled 2021-01-27 (×11): qty 1

## 2021-01-27 NOTE — Progress Notes (Signed)
Progress Note  Patient Name: Monica [name] Date of Encounter: 01/27/2021  [facility] Cardiologist: Lorine Bears, MD   Subjective   Grandson at the bedside Again this morning Extubated past 48 hours ago Cough improving, still mildly productive Still on high flow nasal cannula Feels weak Blood pressure heart rate stable  Having abdominal pain, has not had bowel movement in quite some time  Inpatient Medications    Scheduled Meds:  Chlorhexidine Gluconate Cloth  6 each Topical Daily   docusate  100 mg Per Tube BID   enoxaparin (LOVENOX) injection  40 mg Subcutaneous Q24H   ipratropium-albuterol  3 mL Nebulization Q6H   methylPREDNISolone (SOLU-MEDROL) injection  60 mg Intravenous Q12H   multivitamin with minerals  1 tablet Oral Daily   mupirocin ointment  1 application Nasal BID   pantoprazole (PROTONIX) IV  40 mg Intravenous Daily   pneumococcal 23 valent vaccine  0.5 mL Intramuscular Tomorrow-1000   polyethylene glycol  17 g Per Tube Daily   rifampin  300 mg Oral BID WC   vancomycin variable dose per unstable renal function (pharmacist dosing)   Does not apply See admin instructions   Continuous Infusions:  sodium chloride 10 mL/hr at 01/27/21 0700   promethazine (PHENERGAN) injection (IM or IVPB) Stopped (01/26/21 1902)   PRN Meds: bisacodyl, diazepam, ipratropium-albuterol, lip balm, [DISCONTINUED] metoCLOPramide **OR** metoCLOPramide (REGLAN) injection, morphine injection, nitroGLYCERIN, [DISCONTINUED] promethazine **OR** promethazine (PHENERGAN) injection (IM or IVPB) **OR** promethazine   Vital Signs    Vitals:   01/27/21 0812 01/27/21 0900 01/27/21 1000 01/27/21 1100  BP:  115/81 123/76 122/71  Pulse:   (!) 109 (!) 101  Resp:  (!) 27 (!) 22 (!) 34  Temp:      TempSrc:      SpO2: 91%  94% 90%  Weight:      Height:        Intake/Output Summary (Last 24 hours) at 01/27/2021 1254 Last data filed at 01/27/2021 0700 Gross per 24 hour  Intake 225.78 ml   Output 1100 ml  Net -874.22 ml   Last 3 Weights 01/24/2021 01/15/2021 10/02/2020  Weight (lbs) 224 lb 13.9 oz 235 lb 240 lb  Weight (kg) 102 kg 106.595 kg 108.863 kg      Telemetry    Normal sinus rhythm- Personally Reviewed  ECG     - Personally Reviewed  Physical Exam   On examination : alert oriented, no JVD, lungs clear to auscultation bilaterally, heart sounds regular normal S1-S2 3/6 SEM RSB,  abdomen soft nontender no significant lower extremity edema.  Musculoskeletal exam with good range of motion, neurologic exam grossly nonfocal   Labs    High Sensitivity Troponin:   Recent Labs  Lab 01/20/21 1336 01/20/21 1515  TROPONINIHS 11 12      Chemistry Recent Labs  Lab 01/23/21 0426 01/24/21 0414 01/25/21 0534 01/26/21 0917 01/27/21 0528  NA 134*   < > 135 136 130*  K 3.5   < > 4.6 3.9 3.9  CL 89*   < > 95* 96* 93*  CO2 33*   < > 31 29 28   GLUCOSE 113*   < > 121* 130* 117*  BUN 22   < > 29* 37* 29*  CREATININE 0.77   < > 0.93  1.02* 1.16* 0.92  CALCIUM 8.8*   < > 8.5* 8.5* 8.2*  PROT 7.6  --   --   --   --   ALBUMIN 3.0*  --   --   --   --  AST 26  --   --   --   --   ALT 11  --   --   --   --   ALKPHOS 60  --   --   --   --   BILITOT 1.9*  --   --   --   --   GFRNONAA >60   < > >60  54* 46* >60  ANIONGAP 12   < > 9 11 9    < > = values in this interval not displayed.     Hematology Recent Labs  Lab 01/25/21 0534 01/26/21 0917 01/27/21 0528  WBC 7.4 8.7 8.9  RBC 4.89 4.95 4.76  HGB 11.6* 11.8* 11.3*  HCT 37.7 37.8 36.4  MCV 77.1* 76.4* 76.5*  MCH 23.7* 23.8* 23.7*  MCHC 30.8 31.2 31.0  RDW 19.4* 19.9* 19.9*  PLT 180 165 170    BNP Recent Labs  Lab 01/21/21 0451 01/22/21 1640  BNP 223.1* 146.7*     DDimer  Recent Labs  Lab 01/20/21 1336  DDIMER 3.53*     Radiology    No results found.  Cardiac Studies     Patient Profile       Assessment & Plan    AAA repair repair Over 7 cm in size   Successful endovascular  repair  Vascular following, rounded this morning  Acute on chronic respiratory distress  Multifactorial including pulmonary fibrosis, severe aortic valve stenosis, CHF ,  -Now extubated Cough, sputum, thick secretions, mild improvement compared to yesterday Receiving duo nebs, steroids, -We will restart Lasix 40 mg IV x1 today with close monitoring of renal function  Severe aortic valve stenosis   -Currently weak, recently extubated with acute on chronic respiratory distress  not a good candidate for cardiac work-up for TAVR -Outpatient work-up can be arranged at a later date, unclear if she will be a candidate given profound debility, left hip infection, severe COPD, pulmonary hypertension, high risk for acute on chronic respiratory failure     Postsurgical hip infection   Status postdebridement in OR June 7 with intraoperative cultures growing MRSA blood cultures negative x2   -Relatively immobile at this time      Total encounter time more than 35 minutes  Greater than 50% was spent in counseling and coordination of care with the patient   For questions or updates, please contact [facility] Please consult [email or web address] for contact info under        Signed, [id number], MD  01/27/2021, 12:54 PM
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
## 2021-01-27 NOTE — Progress Notes (Signed)
NAME:  Monica [name], MRN:  [id number], DOB:  Oct 06, 1933, LOS: 12 ADMISSION DATE:  01/15/2021, CONSULTATION DATE:  01/24/2021 REFERRING MD:  Dr. Wyn Quaker, CHIEF COMPLAINT:  Post-op Respiratory Failure   Brief patient description/synopsis:  87 y.o. Female admitted with post-op MRSA infection of left hip.  Incidentally found to have large AAA, status post endovascular repair on 01/24/21.  Returns to ICU post procedure and remains intubated.  History of Present Illness:  Monica [name] is a 87 y.o. Female with a past medical history significant for  CAD s/p PCI x1 in 2018, severe AS being worked up for TAVR, Obesity, pHTN, and COPD who presented to [facility] ED on 6/6/200 with left hip pain, swelling, erythema, tenderness, and warmth.   She had hip fracture after fall in Feb, underwent left hip intramedullary nailing, and was discharged home.  Since being home 4 months ago, really never able to walk due to pain. Finally, in the last week before admission, developed redness and worse pain at left hip surgical site, then wound dehisced.  ED COURSE: Evaluation in the ED showed a subcutaneous fluid collection on CT Scan.  Additionally CT scan showed a large infrarenal aortic aneurysm. Orthopedics and Vascular Surgery was consulted. Cultures of the left hip wound was positive for MRSA.    She was admitted for further workup and treatment of post-surgical hip infection with MRSA.    Pertinent  Medical History  Aortic Stenosis Coronary Artery Disease Hyperlipidemia Recent Left Hip Fracture in February 2022  Micro Data:  01/15/2021: SARS-CoV-2 and influenza PCR>> negative 01/15/2021: Blood culture>>STAPHYLOCOCCUS EPIDERMIDIS (suspected contaminant) 01/15/2021: Repeat blood culture>> no growth 01/16/2021: Left hip wound abscess>> MRSA 01/12/2021: Blood culture x2>> no growth  Antimicrobials:  Cefazolin 6/15 x 1 dose (surgical prophylaxis) Cefepime 6/7>> 6/9 Zosyn 6/6>> 6/6 Rifampin 6/7>> Vancomycin 6/6>> P.o.  fluconazole 6/10>> 6/10  Significant Hospital Events: Including procedures, antibiotic start and stop dates in addition to other pertinent events   01/15/2021: Admitted to Med-surg; Orthopedics, Vascular Surgery consulted 01/16/2021: Underwent irrigation and debridement of left hip wound 01/24/2021: Status post endovascular repair of AAA, returns to ICU and remains intubated postop 01/26/21- patient had mild resp distress and increased O2 requirement. She remains on HFNC/BIPAP, she failed SLP, wound vac in place with no activity.  01/27/21- patient is improved slightly, family came in today to see patient.    Objective   Blood pressure (!) 112/58, pulse (!) 101, temperature 98.3 F (36.8 C), temperature source Oral, resp. rate 18, height 5\' 5"  (1.651 m), weight 102 kg, SpO2 100 %.    FiO2 (%):  [35 %-65 %] 65 %   Intake/Output Summary (Last 24 hours) at 01/27/2021 1608 Last data filed at 01/27/2021 1300 Gross per 24 hour  Intake 475.74 ml  Output 1100 ml  Net -624.26 ml    Filed Weights   01/15/21 1638 01/24/21 1500  Weight: 106.6 kg 102 kg    Examination: General: Acute on chronically ill-appearing female, laying in bed, intubated sedated, no acute distress HENT: Atraumatic, normocephalic, neck supple, no JVD Lungs: High flow nasal cannula heard in background with mild rhonchorous breath sounds bilaterally Cardiovascular: Regular rate and rhythm, + murmur Abdomen: Obese, soft, nontender, nondistended, no guarding or rebound tenderness Extremities: Bilateral lower extremities: Dusky in appearance Neuro: Awake alert able to communicate purposefully and appropriately GU: Foley catheter in place draining yellow urine Skin: Bilateral PAD's is in place clean dry and intact    Chest x-ray 6/15>>1. Appropriately positioned endotracheal tube and  right internal jugular central venous catheter. 2. Pulmonary fibrosis without superimposed acute process. CT Left Hip 6/6>>1. Status post ORIF  of a comminuted intertrochanteric fracture of the left femur with displaced greater and lesser trochanteric fracture fragments, grossly similar alignment compared to intraoperative fluoroscopic images. No bridging bone formation across the fracture site. 2. Small fluid collection within the subcutaneous soft tissues, likely along a surgical incision site the level of the left iliac wing measuring approximately 4.1 x 0.8 x 3.2 cm. Findings may represent a postoperative seroma or hematoma. Infected fluid collection would be difficult to exclude by imaging alone. 3. Partially visualized infrarenal abdominal aortic aneurysm with extensive mural thrombus measuring approximately 6.7 x 6.1 cm. Recommend referral to a vascular specialist. This recommendation follows ACR consensus guidelines: White Paper of the ACR Incidental Findings Committee II on Vascular Findings. J Am Coll Radiol 2013; 10:789-794. 4. Proximal right common iliac artery aneurysm measuring approximately 2.5 cm CTA Chest/Abdomen/Pelvis 6/8>>1. Infrarenal abdominal aortic aneurysm measuring up to 7.7 x 7.6 cm. Recommend referral to a vascular specialist. This recommendation follows ACR consensus guidelines: White Paper of the ACR Incidental Findings Committee II on Vascular Findings. J Am Coll Radiol 2013; 10:789-794. 2. 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 3. 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 4. 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 5. Mediastinal and hilar lymphadenopathy similar to prior examination. This may be due to inflammation or malignancy. 6. Incompletely characterized 1.2 cm mass at the inferior tip of the left kidney should be further evaluated with contrast enhanced CT or MRI of the abdomen on nonemergent basis. 7. 5 mm right upper lobe pulmonary nodule is unchanged since prior exam. No  follow-up needed if patient is low-risk. Non-contrast chest CT can be considered in 12 months if patient is high-risk. This recommendation follows the consensus statement: Guidelines for Management of Incidental Pulmonary Nodules Detected on CT Images: From the Fleischner Society 2017; Radiology 2017; 284:228-243. CTA Chest 6/11>>No evidence of pulmonary embolus. Mild cardiomegaly.  Evidence of pulmonary arterial hypertension. Peripheral interstitial thickening and ground-glass opacities likely reflects fibrosis. Dependent and bibasilar atelectasis or scarring. Stable mediastinal and bilateral hilar adenopathy. Coronary artery disease. Cholelithiasis. Aortic Atherosclerosis CTA Abdomen 6/12>>1. Stable 7.8 x 7.6 cm infrarenal abdominal aortic aneurysm. No evidence for acute rupture. 2. Stable right common iliac artery aneurysm with maximum diameter of 2.7 cm. 3. Stable advanced atherosclerotic calcifications involving the aorta and branch vessels. 4. Stable significant chronic lung disease and mediastinal adenopathy. 5. Stable cholelithiasis. 6. Stable 13 mm lower pole left renal lesion. As before recommend follow-up MRI abdomen for further evaluation.   Aortic Atherosclerosis  Assessment & Plan:   Post-Op Respiratory Failure Acute on Chronic Hypoxic Respiratory Failure due to Aortic Stenosis, CHF, Pulmonary HTN, and Pulmonary Fibrosis -extubated 01/25/21 -Implement VAP bundle -As needed bronchodilators -Continue Solu-Medrol BID -CT angiogram of the chest with patchy subpleural fibrosis, no PE, moderate atelectasis, no edema or large consolidation, no effusion.  Chronic HFpEF PMHx of Severe Aortic Stenosis, CAD -Continuous cardiac monitoring -Maintain MAP greater than 65 -Continue midodrine -Diuresis as blood pressure and renal function permits -Cardiology following, appreciate input -Plan for TAVR workup  8.2 cm Juxtarenal Aortic Aneurysm status post endovascular repair on  01/24/2021 -Vascular surgery following  Postsurgical MRSA Hip Infection s/p Debridement of Left Hip wound on 01/16/21 -Monitor fever curve -Trend WBC's -Follow cultures as above -  1/2 blood cultures + for staph epidermidis which was felt to be comtaminant -ID following, appreciate input -ABX as per ID, currently on Vancomycin & Rifampin (recommends 4 weeks of treatment)    Pt with multiple co-morbidities, long term prognosis is very poor.  Anticipate difficult wean from ventilator.  Palliative Care is following for goals of care.    Best practice (right click and "Reselect all SmartList Selections" daily)  Diet:  NPO Pain/Anxiety/Delirium protocol (if indicated): Yes (RASS goal -1) VAP protocol (if indicated): Yes DVT prophylaxis: LMWH GI prophylaxis: PPI Glucose control:  SSI No Central venous access:  N/A Arterial line:  N/A Foley:  Yes, and it is still needed Mobility:  bed rest  PT consulted: N/A Last date of multidisciplinary goals of care discussion [N/A] Code Status:  DNR Disposition: ICU  Labs   CBC: Recent Labs  Lab 01/23/21 0426 01/24/21 0414 01/25/21 0534 01/26/21 0917 01/27/21 0528  WBC 5.7 4.2 7.4 8.7 8.9  HGB 12.9 12.9 11.6* 11.8* 11.3*  HCT 40.2 41.8 37.7 37.8 36.4  MCV 74.2* 76.8* 77.1* 76.4* 76.5*  PLT 287 251 180 165 170     Basic Metabolic Panel: Recent Labs  Lab 01/23/21 0426 01/24/21 0414 01/25/21 0534 01/26/21 0917 01/27/21 0528  NA 134* 133* 135 136 130*  K 3.5 3.2* 4.6 3.9 3.9  CL 89* 90* 95* 96* 93*  CO2 33* 33* 31 29 28   GLUCOSE 113* 92 121* 130* 117*  BUN 22 25* 29* 37* 29*  CREATININE 0.77 0.74 0.93  1.02* 1.16* 0.92  CALCIUM 8.8* 8.6* 8.5* 8.5* 8.2*  MG  --   --   --   --  2.4  PHOS  --   --   --   --  2.8    GFR: Estimated Creatinine Clearance: 52.9 mL/min (by C-G formula based on SCr of 0.92 mg/dL). Recent Labs  Lab 01/24/21 0414 01/25/21 0534 01/26/21 0917 01/27/21 0528  WBC 4.2 7.4 8.7 8.9     Liver Function  Tests: Recent Labs  Lab 01/23/21 0426  AST 26  ALT 11  ALKPHOS 60  BILITOT 1.9*  PROT 7.6  ALBUMIN 3.0*    No results for input(s): LIPASE, AMYLASE in the last 168 hours. No results for input(s): AMMONIA in the last 168 hours.  ABG    Component Value Date/Time   PHART 7.49 (H) 01/25/2021 1155   PCO2ART 44 01/25/2021 1155   PO2ART 95 01/25/2021 1155   HCO3 33.5 (H) 01/25/2021 1155   O2SAT 97.9 01/25/2021 1155      Coagulation Profile: No results for input(s): INR, PROTIME in the last 168 hours.  Cardiac Enzymes: No results for input(s): CKTOTAL, CKMB, CKMBINDEX, TROPONINI in the last 168 hours.  HbA1C: Hgb A1c MFr Bld  Date/Time Value Ref Range Status  06/02/2017 06:15 AM 5.7 (H) 4.8 - 5.6 % Final    Comment:    (NOTE) Pre diabetes:          5.7%-6.4% Diabetes:              >6.4% Glycemic control for   <7.0% adults with diabetes     CBG: Recent Labs  Lab 01/24/21 1515  GLUCAP 141*     Review of Systems:   Unable to assess due to Intubation/sedation  Past Medical History:  She,  has a past medical history of Aortic stenosis, CAD (coronary artery disease) (2018), GI bleed, HLD (hyperlipidemia), and Obesity.   Surgical History:   Past Surgical History:  Procedure Laterality Date   ANKLE RECONSTRUCTION  1956   also ORIF of right arm   CORONARY STENT INTERVENTION N/A 06/01/2017   Procedure: Coronary/Graft Acute MI Revascularization;  Surgeon: Iran OuchArida, Muhammad A, MD;  Location: [facility] INVASIVE CV LAB;  Service: Cardiovascular;  Laterality: N/A;   ENDOVASCULAR REPAIR/STENT GRAFT N/A 01/24/2021   Procedure: ENDOVASCULAR REPAIR/STENT GRAFT;  Surgeon: Renford DillsSchnier, Gregory G, MD;  Location: [facility] INVASIVE CV LAB;  Service: Cardiovascular;  Laterality: N/A;   INCISION AND DRAINAGE HIP Left 01/16/2021   Procedure: IRRIGATION AND DEBRIDEMENT HIP;  Surgeon: Signa KellPatel, Sunny, MD;  Location: [facility] ORS;  Service: Orthopedics;  Laterality: Left;   INTRAMEDULLARY (IM) NAIL  INTERTROCHANTERIC Left 10/03/2020   Procedure: INTRAMEDULLARY (IM) NAIL INTERTROCHANTRIC;  Surgeon: Signa KellPatel, Sunny, MD;  Location: [facility] ORS;  Service: Orthopedics;  Laterality: Left;   LEFT HEART CATH AND CORONARY ANGIOGRAPHY N/A 06/01/2017   Procedure: LEFT HEART CATH AND CORONARY ANGIOGRAPHY;  Surgeon: Iran OuchArida, Muhammad A, MD;  Location: [facility] INVASIVE CV LAB;  Service: Cardiovascular;  Laterality: N/A;     Social History:   reports that she quit smoking about 3 years ago. Her smoking use included cigarettes. She has a 25.00 pack-year smoking history. She has never used smokeless tobacco. She reports that she does not drink alcohol and does not use drugs.   Family History:  Her family history includes Diabetes Mellitus II in her daughter; Valvular heart disease in her mother.   Allergies No Known Allergies   Home Medications  Prior to Admission medications   Medication Sig Start Date End Date Taking? Authorizing Provider  aspirin 81 MG EC tablet Take 81 mg by mouth daily.   Yes [provider]  carvedilol (COREG) 3.125 MG tablet Take 3.125 mg by mouth 2 (two) times daily with a meal.   Yes [provider]  oxyCODONE (OXY IR/ROXICODONE) 5 MG immediate release tablet Take 5 mg by mouth every 4 (four) hours as needed for severe pain.   Yes [provider]  rosuvastatin (CRESTOR) 10 MG tablet Take 1 tablet by mouth once daily Patient taking differently: Take 10 mg by mouth daily. 07/24/20  Yes Iran OuchArida, Muhammad A, MD  oxyCODONE (OXY IR/ROXICODONE) 5 MG immediate release tablet Take 1-2 tablets (5-10 mg total) by mouth every 4 (four) hours as needed for moderate pain (pain score 4-6). 01/19/21   Anson OregonMcGhee, James Lance, PA-C     Critical care provider statement:    Critical care time (minutes):  33   Critical care time was exclusive of:  Separately billable procedures and  treating other patients   Critical care was necessary to treat or prevent imminent or  life-threatening  deterioration of the following conditions:  acute hypoxemic respiratry failure, CHF, AAA s/p repair, PJI   Critical care was time spent personally by me on the following  activities:  Development of treatment plan with patient or surrogate,  discussions with consultants, evaluation of patient's response to  treatment, examination of patient, obtaining history from patient or  surrogate, ordering and performing treatments and interventions, ordering  and review of laboratory studies and re-evaluation of patient's condition   I assumed direction of critical care for this patient from another  provider in my specialty: no         Vida RiggerFuad Nealy Karapetian, M.D.  Pulmonary & Critical Care Medicine  [facility]

## 2021-01-27 NOTE — Progress Notes (Signed)
Pharmacy Antibiotic Note  Monica [name] is a 87 y.o. female admitted on 01/15/2021 with infected hip following IM nail in February.  Pharmacy has been consulted for Vancomycin Dosing. S/p debridement of the left hip wound by  6/7 with MRSA from OR cultures.  Also on rifampin for presence of foreign material/hardware.    Today, 01/27/2021 Day #12 vancomycin and Day #9 rifampin Renal: SCr = 1.16>0.92  AAA repair performed 6/15 WBC WNL 6/16 afeb LFTs WNL on 6/13 Vancomycin trough 6/16 @ 2058 = 21 mcg/ml Vancomycin random level  6/17 @ 0417 = 15 mcg/ml Since no give given between levels, Ke = 0.0463 for half-life = 15hr  - Vanc random level 6/18 @ 0528= 17 mcg/ml (Last dose given 6/17 at 1316)  Plan: Vanocmycin 1gm IV x 1 today and continue to monitor SCr prior to starting routine dose.  Will consider ordering a random vancomycin level with morning labs Goal AUC 400-600 Monitor renal function  Monitor LFTs weekly  Plan to check vancomycin trough within 48h   Height: 5\' 5"  (165.1 cm) Weight: 102 kg (224 lb 13.9 oz) IBW/kg (Calculated) : 57  Temp (24hrs), Avg:98.6 F (37 C), Min:98.3 F (36.8 C), Max:98.8 F (37.1 C)  Recent Labs  Lab 01/20/21 2113 01/21/21 0451 01/23/21 0426 01/24/21 0414 01/25/21 0534 01/25/21 2058 01/26/21 0417 01/26/21 0917 01/27/21 0528  WBC  --    < > 5.7 4.2 7.4  --   --  8.7 8.9  CREATININE  --    < > 0.77 0.74 0.93  1.02*  --   --  1.16* 0.92  VANCOTROUGH 16  --   --   --   --  21*  --   --   --   VANCORANDOM  --   --   --   --   --   --  15  --  17   < > = values in this interval not displayed.     Estimated Creatinine Clearance: 52.9 mL/min (by C-G formula based on SCr of 0.92 mg/dL).    No Known Allergies  Antimicrobials this admission: zosyn x1 dose 6/6 cefepime 6/7>>6/9 Vanc 6/6 (evening)>> Rifampin 6/10>>   Microbiology results:  6/6 BCx: 2of4 (1 set)= GPC=Staph epi, sens pending 6/7 Wound x 3: MRSA 6/8 Bcx: NG  Thank you  for allowing pharmacy to be a part of this patient's care.  8/8 PharmD Clinical Pharmacist 01/27/2021
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
## 2021-01-27 NOTE — Progress Notes (Signed)
      Daily Progress Note   Assessment/Planning:   POD #3 s/p EVAR w/ L renal snorkel, R renal periscope for large AAA  Multiple active co-morbidities Still on bipap Dry dressing to both groin due to large pannus Nothing to add to care currently.  Dr. Wyn Quaker and Lorretta Harp will be back on Monday.   Subjective  - 3 Days Post-Op   Confused, on bipap   Objective   Vitals:   01/26/21 2131 01/27/21 0259 01/27/21 0446 01/27/21 0812  BP:   119/72   Pulse:      Resp:      Temp:   98.3 F (36.8 C)   TempSrc:   Axillary   SpO2: 100% 95% 93% 91%  Weight:      Height:         Intake/Output Summary (Last 24 hours) at 01/27/2021 0848 Last data filed at 01/27/2021 0500 Gross per 24 hour  Intake 246.8 ml  Output 1305 ml  Net -1058.2 ml    GI  soft, NTND, ulcer on pannus,no palpable AAA  VASC Both groin inc intact, moist groins due to large pannus    Laboratory   CBC CBC Latest Ref Rng & Units 01/27/2021 01/26/2021 01/25/2021  WBC 4.0 - 10.5 K/uL 8.9 8.7 7.4  Hemoglobin 12.0 - 15.0 g/dL 11.3(L) 11.8(L) 11.6(L)  Hematocrit 36.0 - 46.0 % 36.4 37.8 37.7  Platelets 150 - 400 K/uL 170 165 180    BMET    Component Value Date/Time   NA 130 (L) 01/27/2021 0528   K 3.9 01/27/2021 0528   CL 93 (L) 01/27/2021 0528   CO2 28 01/27/2021 0528   GLUCOSE 117 (H) 01/27/2021 0528   BUN 29 (H) 01/27/2021 0528   CREATININE 0.92 01/27/2021 0528   CALCIUM 8.2 (L) 01/27/2021 0528   GFRNONAA >60 01/27/2021 0528   GFRAA >60 05/02/2018 8811     Leonides Sake, MD, FACS, FSVS Covering for [facility]: [phone]  01/27/2021, 8:48 AM

## 2021-01-27 NOTE — Progress Notes (Signed)
PROGRESS NOTE    Monica [name]  XQJ:[id number] DOB: [birth year] DOA: 01/15/2021 PCP: Patient, No Pcp Per (Inactive)   Brief Narrative:  Monica [name] is a 87 y.o. F with CAD s/p PCI x1 in 2018, severe AS being worked up for TAVR, Obesity, pHTN, and COPD who presented with hip pain.   Had hip fracture after fall in Feb, repaired and discharged home.  Since being home 4 months ago, really never able to walk due to pain. Finally, in the last week before admission, developed redness and worse pain at left hip surgical site, then wound dehisced.   Presented to the ER and Orthopedics were consulted for suspected post-surgical hip infection with MRSA.  ID is recommending 4 weeks of vancomycin and rifampin.    Also noted incidentally to have very large AAA, she was taken to the OR for endovascular repair with vascular surgery today.  Also had severe aortic stenosis and plan is for TAVR work-up after AAA repair, most likely as an outpatient if able to get out of hospital from current admission.  She will not be a good candidate due to multiple comorbidities.  Patient with multiple life-threatening comorbidities-high risk for deterioration and death, palliative care was also consulted, patient is now on DNR/DNI.  She will consider comfort care if deteriorates. At this time we will continue current level of care.  Patient had her endovascular AAA repair done with vascular surgery on 01/24/2021, she remained intubated and in ICU for 2 days after that, extubated yesterday and we will resume care again from today.  Remained on heated high flow, weak cough and appears very lethargic.  6/18: Appears more lethargic with worsening oxygen requirement, today at 50 L and 65% FiO2  Subjective: Patient appears very weak and lethargic, stating that I suppose maybe I am little better.  Still unable to eat and drink except few sips here and there.  Noted worsening of hypoxia.  Denies any pain.  Assessment & Plan:    Active Problems:   Abscess of left hip   Abdominal aortic aneurysm (AAA) without rupture (HCC)   Obesity, Class III, BMI 40-49.9 (morbid obesity) (HCC)   Pre-op evaluation   Shortness of breath   Hypoxia  Postsurgical hip infection S/p debridement of the left hip wound by Dr. Allena Katz, 6/7 Admitted and taken to OR 6/7 for washout.  Intraoperative cultures growing MRSA in 3/3. ID consulted, recommended treating with 4 weeks antibiotics for presumed MRSA prosthetic infection.  1/2 blood cultures positive for staph epidermidis which was thought to be a contaminant. 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  Patient was evaluated by vascular surgery, who recommended urgent repair given the size.  She is not a candidate for open repair.  She was taken to the OR for endovascular repair-tolerated the procedure well, stable from vascular standpoint and will follow-up as an outpatient. -Appreciate vascular help.  Severe aortic stenosis Previously known, since hip fracture in Feb.  Was pending TAVR eval prior to this admission, seen by Cardiology here.  -No plan to start TAVR work-up while she is in the hospital due to other comorbidities and life-threatening conditions.  Most likely will be done as an outpatient.    Pulmonary hypertension due to AS, CHF   Hypotension due to aortic stenosis -Continue midodrine   Chronic diastolic CHF -Defer diuretic to Cardiology-Lasix was held initially for the past couple of days, worsening hypoxia with  basal crackles-1 dose of Lasix ordered.  Acute on chronic hypoxic respiratory failure.  Seems worsening Has chronic hypoxic respiratory failure due to aortic stenosis, heart failure, and pulmonary hypertension.  COPD is in chart but appears to be mild. Former smoker.  Uses 2 L of oxygen at baseline.  Initially requiring 2 to 3 L and then got worse requiring up to 10 L starting from  6/12. Chest x-ray unremarkable, CT angiogram of the chest with patchy subpleural fibrosis, no PE, moderate atelectasis, no edema or large consolidation, no effusion. Patient required prolonged intubation after the procedure, extubated yesterday but continued to require very high levels of oxygen. - Consult to pulmonogy-appreciate their help - Continue Solumedrol BID for NSIP    Coronary artery disease, secondary prevention - Continue Crestor   Iron deficiency anemia Hgb stable Given Feraheme 6/10 -Transfusion threshold 8 g/dL  Hyponatremia.  Resolved Mild, asymptomatic  Vaginal candidiasis Given Fluconazole 1 dose on 6/10.   Left renal nodule Incidental finding, minor, given gravity of AAA and AS and hypoxia, this likely has not been discussed with patient and should be followed up only depending on goals of care after AAA repair - Follow-up MRI as an outpatient  Class II obesity. Estimated body mass index is 37.42 kg/m as calculated from the following:   Height as of this encounter: 5\' 5"  (1.651 m).   Weight as of this encounter: 102 kg.   Goals of care.  Patient with multiple life limiting comorbidities, very high risk for deterioration and death.  Palliative care is involved.  Currently we will continue current level of care but if she deteriorates they will consider comfort care.  She is now DNR/DNI  Objective: Vitals:   01/27/21 0812 01/27/21 0900 01/27/21 1000 01/27/21 1100  BP:  115/81 123/76 122/71  Pulse:   (!) 109 (!) 101  Resp:  (!) 27 (!) 22 (!) 34  Temp:      TempSrc:      SpO2: 91%  94% 90%  Weight:      Height:        Intake/Output Summary (Last 24 hours) at 01/27/2021 1253 Last data filed at 01/27/2021 0700 Gross per 24 hour  Intake 225.78 ml  Output 1100 ml  Net -874.22 ml    Filed Weights   01/15/21 1638 01/24/21 1500  Weight: 106.6 kg 102 kg    Examination:  General.  Chronically ill-appearing, lethargic lady, in no acute  distress. Pulmonary.  Bilateral basal crackles., normal respiratory effort. CV.  Regular rate and rhythm, no JVD, rub or murmur. Abdomen.  Soft, nontender, nondistended, BS positive. CNS.  Alert and oriented.  No focal neurologic deficit. Extremities.  No edema, no cyanosis, pulses intact and symmetrical.  DVT prophylaxis: Lovenox Code Status: DNR Family Communication: Sister and grandson at bedside. Disposition Plan:  Status is: Inpatient  Remains inpatient appropriate because:Inpatient level of care appropriate due to severity of illness  Dispo: The patient is from:  Home              Anticipated d/c is to:  To be determined              Patient currently is not medically stable to d/c.   Difficult to place patient No                Level of care: Stepdown  All the records are reviewed and case discussed with Care Management/Social Worker. Management plans discussed with the patient, nursing and they  are in agreement.  Consultants:  Vascular surgery Pulmonology Cardiology  Procedures:  Antimicrobials:  Vancomycin Rifampin  Data Reviewed: I have personally reviewed following labs and imaging studies  CBC: Recent Labs  Lab 01/23/21 0426 01/24/21 0414 01/25/21 0534 01/26/21 0917 01/27/21 0528  WBC 5.7 4.2 7.4 8.7 8.9  HGB 12.9 12.9 11.6* 11.8* 11.3*  HCT 40.2 41.8 37.7 37.8 36.4  MCV 74.2* 76.8* 77.1* 76.4* 76.5*  PLT 287 251 180 165 170    Basic Metabolic Panel: Recent Labs  Lab 01/23/21 0426 01/24/21 0414 01/25/21 0534 01/26/21 0917 01/27/21 0528  NA 134* 133* 135 136 130*  K 3.5 3.2* 4.6 3.9 3.9  CL 89* 90* 95* 96* 93*  CO2 33* 33* 31 29 28   GLUCOSE 113* 92 121* 130* 117*  BUN 22 25* 29* 37* 29*  CREATININE 0.77 0.74 0.93  1.02* 1.16* 0.92  CALCIUM 8.8* 8.6* 8.5* 8.5* 8.2*  MG  --   --   --   --  2.4  PHOS  --   --   --   --  2.8    GFR: Estimated Creatinine Clearance: 52.9 mL/min (by C-G formula based on SCr of 0.92 mg/dL). Liver Function  Tests: Recent Labs  Lab 01/23/21 0426  AST 26  ALT 11  ALKPHOS 60  BILITOT 1.9*  PROT 7.6  ALBUMIN 3.0*    No results for input(s): LIPASE, AMYLASE in the last 168 hours. No results for input(s): AMMONIA in the last 168 hours. Coagulation Profile: No results for input(s): INR, PROTIME in the last 168 hours. Cardiac Enzymes: No results for input(s): CKTOTAL, CKMB, CKMBINDEX, TROPONINI in the last 168 hours. BNP (last 3 results) No results for input(s): PROBNP in the last 8760 hours. HbA1C: No results for input(s): HGBA1C in the last 72 hours. CBG: Recent Labs  Lab 01/24/21 1515  GLUCAP 141*    Lipid Profile: No results for input(s): CHOL, HDL, LDLCALC, TRIG, CHOLHDL, LDLDIRECT in the last 72 hours. Thyroid Function Tests: No results for input(s): TSH, T4TOTAL, FREET4, T3FREE, THYROIDAB in the last 72 hours. Anemia Panel: No results for input(s): VITAMINB12, FOLATE, FERRITIN, TIBC, IRON, RETICCTPCT in the last 72 hours. Sepsis Labs: No results for input(s): PROCALCITON, LATICACIDVEN in the last 168 hours.  No results found for this or any previous visit (from the past 240 hour(s)).    Radiology Studies: No results found.  Scheduled Meds:  Chlorhexidine Gluconate Cloth  6 each Topical Daily   docusate  100 mg Per Tube BID   enoxaparin (LOVENOX) injection  40 mg Subcutaneous Q24H   ipratropium-albuterol  3 mL Nebulization Q6H   methylPREDNISolone (SOLU-MEDROL) injection  60 mg Intravenous Q12H   multivitamin with minerals  1 tablet Oral Daily   mupirocin ointment  1 application Nasal BID   pantoprazole (PROTONIX) IV  40 mg Intravenous Daily   pneumococcal 23 valent vaccine  0.5 mL Intramuscular Tomorrow-1000   polyethylene glycol  17 g Per Tube Daily   rifampin  300 mg Oral BID WC   vancomycin variable dose per unstable renal function (pharmacist dosing)   Does not apply See admin instructions   Continuous Infusions:  sodium chloride 10 mL/hr at 01/27/21 0700    promethazine (PHENERGAN) injection (IM or IVPB) Stopped (01/26/21 1902)     LOS: 12 days   Time spent: 40 minutes More than 50% of the time was spent in counseling/coordination of care  01/28/21, MD [facility]  If 7PM-7AM, please contact night-coverage [email or web address]  01/27/2021, 12:53 PM   This record has been created using Conservation officer, historic buildingsDragon voice recognition software. Errors have been sought and corrected,but may not always be located. Such creation errors do not reflect on the standard of care.
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
## 2021-01-28 DIAGNOSIS — J9622 Acute and chronic respiratory failure with hypercapnia: Secondary | ICD-10-CM

## 2021-01-28 DIAGNOSIS — J9621 Acute and chronic respiratory failure with hypoxia: Secondary | ICD-10-CM

## 2021-01-28 DIAGNOSIS — I714 Abdominal aortic aneurysm, without rupture: Secondary | ICD-10-CM | POA: Diagnosis not present

## 2021-01-28 DIAGNOSIS — R0602 Shortness of breath: Secondary | ICD-10-CM | POA: Diagnosis not present

## 2021-01-28 DIAGNOSIS — L02416 Cutaneous abscess of left lower limb: Secondary | ICD-10-CM | POA: Diagnosis not present

## 2021-01-28 LAB — MAGNESIUM: Magnesium: 2.2 mg/dL (ref 1.7–2.4)

## 2021-01-28 LAB — BASIC METABOLIC PANEL
Anion gap: 8 (ref 5–15)
BUN: 28 mg/dL — ABNORMAL HIGH (ref 8–23)
CO2: 29 mmol/L (ref 22–32)
Calcium: 8.3 mg/dL — ABNORMAL LOW (ref 8.9–10.3)
Chloride: 98 mmol/L (ref 98–111)
Creatinine, Ser: 0.98 mg/dL (ref 0.44–1.00)
GFR, Estimated: 57 mL/min — ABNORMAL LOW (ref >60)
Glucose, Bld: 122 mg/dL — ABNORMAL HIGH (ref 70–99)
Potassium: 3.3 mmol/L — ABNORMAL LOW (ref 3.5–5.1)
Sodium: 135 mmol/L (ref 135–145)

## 2021-01-28 LAB — CBC
HCT: 34.8 % — ABNORMAL LOW (ref 36.0–46.0)
Hemoglobin: 11 g/dL — ABNORMAL LOW (ref 12.0–15.0)
MCH: 24.3 pg — ABNORMAL LOW (ref 26.0–34.0)
MCHC: 31.6 g/dL (ref 30.0–36.0)
MCV: 77 fL — ABNORMAL LOW (ref 80.0–100.0)
Platelets: 175 10*3/uL (ref 150–400)
RBC: 4.52 MIL/uL (ref 3.87–5.11)
RDW: 20.1 % — ABNORMAL HIGH (ref 11.5–15.5)
WBC: 8.8 10*3/uL (ref 4.0–10.5)
nRBC: 0 % (ref 0.0–0.2)

## 2021-01-28 LAB — PHOSPHORUS: Phosphorus: 2.3 mg/dL — ABNORMAL LOW (ref 2.5–4.6)

## 2021-01-28 LAB — VANCOMYCIN, RANDOM: Vancomycin Rm: 17

## 2021-01-28 MED ORDER — FUROSEMIDE 40 MG PO TABS
40.0000 mg | ORAL_TABLET | Freq: Every day | ORAL | Status: DC
  Administered 2021-01-29 – 2021-01-31 (×3): 40 mg via ORAL
  Filled 2021-01-28 (×3): qty 1

## 2021-01-28 MED ORDER — VANCOMYCIN HCL 1000 MG/200ML IV SOLN
1000.0000 mg | INTRAVENOUS | Status: DC
  Administered 2021-01-28: 1000 mg via INTRAVENOUS
  Filled 2021-01-28 (×2): qty 200

## 2021-01-28 MED ORDER — POTASSIUM CHLORIDE CRYS ER 20 MEQ PO TBCR: 20.0000 meq | EXTENDED_RELEASE_TABLET | Freq: Once | ORAL | Status: DC

## 2021-01-28 MED ORDER — FUROSEMIDE 10 MG/ML IJ SOLN
40.0000 mg | Freq: Once | INTRAMUSCULAR | Status: AC
  Administered 2021-01-28: 40 mg via INTRAVENOUS
  Filled 2021-01-28: qty 4

## 2021-01-28 MED ORDER — DILTIAZEM HCL ER COATED BEADS 120 MG PO CP24
120.0000 mg | ORAL_CAPSULE | Freq: Every day | ORAL | Status: DC
  Administered 2021-01-28 – 2021-01-29 (×2): 120 mg via ORAL
  Filled 2021-01-28 (×2): qty 1

## 2021-01-28 MED ORDER — POTASSIUM CHLORIDE 10 MEQ/50ML IV SOLN
10.0000 meq | INTRAVENOUS | Status: AC
  Administered 2021-01-28 (×2): 10 meq via INTRAVENOUS
  Filled 2021-01-28 (×2): qty 50

## 2021-01-28 MED ORDER — POTASSIUM CHLORIDE CRYS ER 20 MEQ PO TBCR: 40.0000 meq | EXTENDED_RELEASE_TABLET | Freq: Once | ORAL | Status: DC

## 2021-01-28 MED ORDER — POLYETHYLENE GLYCOL 3350 17 G PO PACK
17.0000 g | PACK | Freq: Every day | ORAL | Status: DC
  Administered 2021-01-29 – 2021-02-10 (×13): 17 g via ORAL
  Filled 2021-01-28 (×13): qty 1

## 2021-01-28 MED ORDER — POTASSIUM PHOSPHATES 15 MMOLE/5ML IV SOLN
15.0000 mmol | Freq: Once | INTRAVENOUS | Status: AC
Start: 2021-01-28 — End: 2021-01-28
  Administered 2021-01-28: 15 mmol via INTRAVENOUS
  Filled 2021-01-28: qty 5

## 2021-01-28 MED ORDER — POTASSIUM CHLORIDE 20 MEQ PO PACK
20.0000 meq | PACK | Freq: Once | ORAL | Status: AC
  Administered 2021-01-28: 20 meq via ORAL
  Filled 2021-01-28: qty 1

## 2021-01-28 MED ORDER — VANCOMYCIN HCL IN DEXTROSE 1-5 GM/200ML-% IV SOLN
1000.0000 mg | INTRAVENOUS | Status: DC
  Administered 2021-01-29 – 2021-01-31 (×3): 1000 mg via INTRAVENOUS
  Filled 2021-01-28 (×5): qty 200

## 2021-01-28 MED ORDER — VANCOMYCIN HCL 10 G IV SOLR
1000.0000 mg | INTRAVENOUS | Status: DC
  Filled 2021-01-28 (×2): qty 1000

## 2021-01-28 MED ORDER — POTASSIUM CHLORIDE 20 MEQ PO PACK
40.0000 meq | PACK | Freq: Once | ORAL | Status: AC
  Administered 2021-01-28: 40 meq via ORAL
  Filled 2021-01-28: qty 2

## 2021-01-28 NOTE — Progress Notes (Signed)
NAME:  Monica [name], MRN:  [id number], DOB:  [birth year], LOS: 13 ADMISSION DATE:  01/15/2021, CONSULTATION DATE:  01/24/2021 REFERRING MD:  Dr. Wyn Quaker, CHIEF COMPLAINT:  Post-op Respiratory Failure   Brief patient description/synopsis:  87 y.o. Female admitted with post-op MRSA infection of left hip.  Incidentally found to have large AAA, status post endovascular repair on 01/24/21.  Returns to ICU post procedure and remains intubated.  History of Present Illness:  Monica [name] is a 87 y.o. Female with a past medical history significant for  CAD s/p PCI x1 in 2018, severe AS being worked up for TAVR, Obesity, pHTN, and COPD who presented to [facility] ED on 6/6/200 with left hip pain, swelling, erythema, tenderness, and warmth.   She had hip fracture after fall in Feb, underwent left hip intramedullary nailing, and was discharged home.  Since being home 4 months ago, really never able to walk due to pain. Finally, in the last week before admission, developed redness and worse pain at left hip surgical site, then wound dehisced.  ED COURSE: Evaluation in the ED showed a subcutaneous fluid collection on CT Scan.  Additionally CT scan showed a large infrarenal aortic aneurysm. Orthopedics and Vascular Surgery was consulted. Cultures of the left hip wound was positive for MRSA.    She was admitted for further workup and treatment of post-surgical hip infection with MRSA.    Pertinent  Medical History  Aortic Stenosis Coronary Artery Disease Hyperlipidemia Recent Left Hip Fracture in February 2022  Micro Data:  01/15/2021: SARS-CoV-2 and influenza PCR>> negative 01/15/2021: Blood culture>>STAPHYLOCOCCUS EPIDERMIDIS (suspected contaminant) 01/15/2021: Repeat blood culture>> no growth 01/16/2021: Left hip wound abscess>> MRSA 01/12/2021: Blood culture x2>> no growth  Antimicrobials:  Cefazolin 6/15 x 1 dose (surgical prophylaxis) Cefepime 6/7>> 6/9 Zosyn 6/6>> 6/6 Rifampin 6/7>> Vancomycin 6/6>> P.o.  fluconazole 6/10>> 6/10  Significant Hospital Events: Including procedures, antibiotic start and stop dates in addition to other pertinent events   01/15/2021: Admitted to Med-surg; Orthopedics, Vascular Surgery consulted 01/16/2021: Underwent irrigation and debridement of left hip wound 01/24/2021: Status post endovascular repair of AAA, returns to ICU and remains intubated postop 01/26/21- patient had mild resp distress and increased O2 requirement. She remains on HFNC/BIPAP, she failed SLP, wound vac in place with no activity.  01/27/21- patient is improved slightly, family came in today to see patient.  01/28/21- patient is further improved plan to optmize for downgrade to Medical flroor.    Objective   Blood pressure 139/80, pulse (!) 113, temperature 98.3 F (36.8 C), temperature source Oral, resp. rate (!) 28, height 5\' 5"  (1.651 m), weight 102 kg, SpO2 94 %.    FiO2 (%):  [50 %-65 %] 50 %   Intake/Output Summary (Last 24 hours) at 01/28/2021 1229 Last data filed at 01/28/2021 0537 Gross per 24 hour  Intake 249.96 ml  Output 1750 ml  Net -1500.04 ml    Filed Weights   01/15/21 1638 01/24/21 1500  Weight: 106.6 kg 102 kg    Examination: General: Acute on chronically ill-appearing female, laying in bed, intubated sedated, no acute distress HENT: Atraumatic, normocephalic, neck supple, no JVD Lungs: High flow nasal cannula heard in background with mild rhonchorous breath sounds bilaterally Cardiovascular: Regular rate and rhythm, + murmur Abdomen: Obese, soft, nontender, nondistended, no guarding or rebound tenderness Extremities: Bilateral lower extremities: Dusky in appearance Neuro: Awake alert able to communicate purposefully and appropriately GU: Foley catheter in place draining yellow urine Skin: Bilateral PAD's is in place clean  dry and intact    Chest x-ray 6/15>>1. Appropriately positioned endotracheal tube and right internal jugular central venous catheter. 2.  Pulmonary fibrosis without superimposed acute process. CT Left Hip 6/6>>1. Status post ORIF of a comminuted intertrochanteric fracture of the left femur with displaced greater and lesser trochanteric fracture fragments, grossly similar alignment compared to intraoperative fluoroscopic images. No bridging bone formation across the fracture site. 2. Small fluid collection within the subcutaneous soft tissues, likely along a surgical incision site the level of the left iliac wing measuring approximately 4.1 x 0.8 x 3.2 cm. Findings may represent a postoperative seroma or hematoma. Infected fluid collection would be difficult to exclude by imaging alone. 3. Partially visualized infrarenal abdominal aortic aneurysm with extensive mural thrombus measuring approximately 6.7 x 6.1 cm. Recommend referral to a vascular specialist. This recommendation follows ACR consensus guidelines: White Paper of the ACR Incidental Findings Committee II on Vascular Findings. J Am Coll Radiol 2013; 10:789-794. 4. Proximal right common iliac artery aneurysm measuring approximately 2.5 cm CTA Chest/Abdomen/Pelvis 6/8>>1. Infrarenal abdominal aortic aneurysm measuring up to 7.7 x 7.6 cm. Recommend referral to a vascular specialist. This recommendation follows ACR consensus guidelines: White Paper of the ACR Incidental Findings Committee II on Vascular Findings. J Am Coll Radiol 2013; 10:789-794. 2. 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 3. 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 4. 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 5. Mediastinal and hilar lymphadenopathy similar to prior examination. This may be due to inflammation or malignancy. 6. Incompletely characterized 1.2 cm mass at the inferior tip of the left kidney should be further evaluated with contrast enhanced CT or MRI of the abdomen on  nonemergent basis. 7. 5 mm right upper lobe pulmonary nodule is unchanged since prior exam. No follow-up needed if patient is low-risk. Non-contrast chest CT can be considered in 12 months if patient is high-risk. This recommendation follows the consensus statement: Guidelines for Management of Incidental Pulmonary Nodules Detected on CT Images: From the Fleischner Society 2017; Radiology 2017; 284:228-243. CTA Chest 6/11>>No evidence of pulmonary embolus. Mild cardiomegaly.  Evidence of pulmonary arterial hypertension. Peripheral interstitial thickening and ground-glass opacities likely reflects fibrosis. Dependent and bibasilar atelectasis or scarring. Stable mediastinal and bilateral hilar adenopathy. Coronary artery disease. Cholelithiasis. Aortic Atherosclerosis CTA Abdomen 6/12>>1. Stable 7.8 x 7.6 cm infrarenal abdominal aortic aneurysm. No evidence for acute rupture. 2. Stable right common iliac artery aneurysm with maximum diameter of 2.7 cm. 3. Stable advanced atherosclerotic calcifications involving the aorta and branch vessels. 4. Stable significant chronic lung disease and mediastinal adenopathy. 5. Stable cholelithiasis. 6. Stable 13 mm lower pole left renal lesion. As before recommend follow-up MRI abdomen for further evaluation.   Aortic Atherosclerosis  Assessment & Plan:   Post-Op Respiratory Failure Acute on Chronic Hypoxic Respiratory Failure due to Aortic Stenosis, CHF, Pulmonary HTN, and Pulmonary Fibrosis -extubated 01/25/21 -Implement VAP bundle -As needed bronchodilators -Continue Solu-Medrol BID -CT angiogram of the chest with patchy subpleural fibrosis, no PE, moderate atelectasis, no edema or large consolidation, no effusion.  Chronic HFpEF PMHx of Severe Aortic Stenosis, CAD -Continuous cardiac monitoring -Maintain MAP greater than 65 -Continue midodrine -Diuresis as blood pressure and renal function permits -Cardiology following, appreciate  input -Plan for TAVR workup  8.2 cm Juxtarenal Aortic Aneurysm status post endovascular repair on 01/24/2021 -Vascular surgery following  Postsurgical MRSA Hip Infection s/p Debridement of  Left Hip wound on 01/16/21 -Monitor fever curve -Trend WBC's -Follow cultures as above -1/2 blood cultures + for staph epidermidis which was felt to be comtaminant -ID following, appreciate input -ABX as per ID, currently on Vancomycin & Rifampin (recommends 4 weeks of treatment)    Pt with multiple co-morbidities, long term prognosis is very poor.  Anticipate difficult wean from ventilator.  Palliative Care is following for goals of care.    Best practice (right click and "Reselect all SmartList Selections" daily)  Diet:  NPO Pain/Anxiety/Delirium protocol (if indicated): Yes (RASS goal -1) VAP protocol (if indicated): Yes DVT prophylaxis: LMWH GI prophylaxis: PPI Glucose control:  SSI No Central venous access:  N/A Arterial line:  N/A Foley:  Yes, and it is still needed Mobility:  bed rest  PT consulted: N/A Last date of multidisciplinary goals of care discussion [N/A] Code Status:  DNR Disposition: ICU  Labs   CBC: Recent Labs  Lab 01/24/21 0414 01/25/21 0534 01/26/21 0917 01/27/21 0528 01/28/21 0500  WBC 4.2 7.4 8.7 8.9 8.8  HGB 12.9 11.6* 11.8* 11.3* 11.0*  HCT 41.8 37.7 37.8 36.4 34.8*  MCV 76.8* 77.1* 76.4* 76.5* 77.0*  PLT 251 180 165 170 175     Basic Metabolic Panel: Recent Labs  Lab 01/24/21 0414 01/25/21 0534 01/26/21 0917 01/27/21 0528 01/28/21 0500  NA 133* 135 136 130* 135  K 3.2* 4.6 3.9 3.9 3.3*  CL 90* 95* 96* 93* 98  CO2 33* 31 29 28 29   GLUCOSE 92 121* 130* 117* 122*  BUN 25* 29* 37* 29* 28*  CREATININE 0.74 0.93  1.02* 1.16* 0.92 0.98  CALCIUM 8.6* 8.5* 8.5* 8.2* 8.3*  MG  --   --   --  2.4 2.2  PHOS  --   --   --  2.8 2.3*    GFR: Estimated Creatinine Clearance: 49.7 mL/min (by C-G formula based on SCr of 0.98 mg/dL). Recent Labs  Lab  01/25/21 0534 01/26/21 0917 01/27/21 0528 01/28/21 0500  WBC 7.4 8.7 8.9 8.8     Liver Function Tests: Recent Labs  Lab 01/23/21 0426  AST 26  ALT 11  ALKPHOS 60  BILITOT 1.9*  PROT 7.6  ALBUMIN 3.0*    No results for input(s): LIPASE, AMYLASE in the last 168 hours. No results for input(s): AMMONIA in the last 168 hours.  ABG    Component Value Date/Time   PHART 7.49 (H) 01/25/2021 1155   PCO2ART 44 01/25/2021 1155   PO2ART 95 01/25/2021 1155   HCO3 33.5 (H) 01/25/2021 1155   O2SAT 97.9 01/25/2021 1155      Coagulation Profile: No results for input(s): INR, PROTIME in the last 168 hours.  Cardiac Enzymes: No results for input(s): CKTOTAL, CKMB, CKMBINDEX, TROPONINI in the last 168 hours.  HbA1C: Hgb A1c MFr Bld  Date/Time Value Ref Range Status  06/02/2017 06:15 AM 5.7 (H) 4.8 - 5.6 % Final    Comment:    (NOTE) Pre diabetes:          5.7%-6.4% Diabetes:              >6.4% Glycemic control for   <7.0% adults with diabetes     CBG: Recent Labs  Lab 01/24/21 1515  GLUCAP 141*     Review of Systems:   Unable to assess due to Intubation/sedation  Past Medical History:  She,  has a past medical history of Aortic stenosis, CAD (coronary artery disease) (2018), GI bleed, HLD (hyperlipidemia), and  Obesity.   Surgical History:   Past Surgical History:  Procedure Laterality Date   ANKLE RECONSTRUCTION  1956   also ORIF of right arm   CORONARY STENT INTERVENTION N/A 06/01/2017   Procedure: Coronary/Graft Acute MI Revascularization;  Surgeon: Iran OuchArida, Muhammad A, MD;  Location: [facility] INVASIVE CV LAB;  Service: Cardiovascular;  Laterality: N/A;   ENDOVASCULAR REPAIR/STENT GRAFT N/A 01/24/2021   Procedure: ENDOVASCULAR REPAIR/STENT GRAFT;  Surgeon: Renford DillsSchnier, Gregory G, MD;  Location: [facility] INVASIVE CV LAB;  Service: Cardiovascular;  Laterality: N/A;   INCISION AND DRAINAGE HIP Left 01/16/2021   Procedure: IRRIGATION AND DEBRIDEMENT HIP;  Surgeon: Signa KellPatel, Sunny,  MD;  Location: [facility] ORS;  Service: Orthopedics;  Laterality: Left;   INTRAMEDULLARY (IM) NAIL INTERTROCHANTERIC Left 10/03/2020   Procedure: INTRAMEDULLARY (IM) NAIL INTERTROCHANTRIC;  Surgeon: Signa KellPatel, Sunny, MD;  Location: [facility] ORS;  Service: Orthopedics;  Laterality: Left;   LEFT HEART CATH AND CORONARY ANGIOGRAPHY N/A 06/01/2017   Procedure: LEFT HEART CATH AND CORONARY ANGIOGRAPHY;  Surgeon: Iran OuchArida, Muhammad A, MD;  Location: [facility] INVASIVE CV LAB;  Service: Cardiovascular;  Laterality: N/A;     Social History:   reports that she quit smoking about 3 years ago. Her smoking use included cigarettes. She has a 25.00 pack-year smoking history. She has never used smokeless tobacco. She reports that she does not drink alcohol and does not use drugs.   Family History:  Her family history includes Diabetes Mellitus II in her daughter; Valvular heart disease in her mother.   Allergies No Known Allergies   Home Medications  Prior to Admission medications   Medication Sig Start Date End Date Taking? Authorizing Provider  aspirin 81 MG EC tablet Take 81 mg by mouth daily.   Yes [provider]  carvedilol (COREG) 3.125 MG tablet Take 3.125 mg by mouth 2 (two) times daily with a meal.   Yes [provider]  oxyCODONE (OXY IR/ROXICODONE) 5 MG immediate release tablet Take 5 mg by mouth every 4 (four) hours as needed for severe pain.   Yes [provider]  rosuvastatin (CRESTOR) 10 MG tablet Take 1 tablet by mouth once daily Patient taking differently: Take 10 mg by mouth daily. 07/24/20  Yes Iran OuchArida, Muhammad A, MD  oxyCODONE (OXY IR/ROXICODONE) 5 MG immediate release tablet Take 1-2 tablets (5-10 mg total) by mouth every 4 (four) hours as needed for moderate pain (pain score 4-6). 01/19/21   Anson OregonMcGhee, James Lance, PA-C     Critical care provider statement:    Critical care time (minutes):  33   Critical care time was exclusive of:  Separately billable procedures and  treating  other patients   Critical care was necessary to treat or prevent imminent or  life-threatening deterioration of the following conditions:  acute hypoxemic respiratry failure, CHF, AAA s/p repair, PJI   Critical care was time spent personally by me on the following  activities:  Development of treatment plan with patient or surrogate,  discussions with consultants, evaluation of patient's response to  treatment, examination of patient, obtaining history from patient or  surrogate, ordering and performing treatments and interventions, ordering  and review of laboratory studies and re-evaluation of patient's condition   I assumed direction of critical care for this patient from another  provider in my specialty: no         Vida RiggerFuad Tatem Fesler, M.D.  Pulmonary & Critical Care Medicine  [facility]

## 2021-01-28 NOTE — Progress Notes (Addendum)
Progress Note  Patient Name: Monica [name] Date of Encounter: 01/28/2021  [facility] Cardiologist: Lorine Bears, MD   Subjective   No family at the bedside Continued respiratory distress, on high flow nasal cannula oxygen 40% FiO2 No complaints this morning, received morphine for pain Remains in bed Telemetry reviewed, heart rate elevated 100 - 110   Inpatient Medications    Scheduled Meds:  Chlorhexidine Gluconate Cloth  6 each Topical Daily   docusate  100 mg Per Tube BID   enoxaparin (LOVENOX) injection  40 mg Subcutaneous Q24H   ipratropium-albuterol  3 mL Nebulization Q6H   methylPREDNISolone (SOLU-MEDROL) injection  40 mg Intravenous Daily   multivitamin with minerals  1 tablet Oral Daily   mupirocin ointment  1 application Nasal BID   pantoprazole (PROTONIX) IV  40 mg Intravenous Daily   pneumococcal 23 valent vaccine  0.5 mL Intramuscular Tomorrow-1000   polyethylene glycol  17 g Per Tube Daily   rifampin  300 mg Oral BID WC   Continuous Infusions:  sodium chloride 10 mL/hr at 01/27/21 1300   potassium PHOSPHATE IVPB (in mmol) 15 mmol (01/28/21 0824)   promethazine (PHENERGAN) injection (IM or IVPB) Stopped (01/26/21 1902)   vancomycin 1,000 mg (01/28/21 1014)   PRN Meds: bisacodyl, diazepam, ipratropium-albuterol, lip balm, [DISCONTINUED] metoCLOPramide **OR** metoCLOPramide (REGLAN) injection, morphine injection, nitroGLYCERIN, [DISCONTINUED] promethazine **OR** promethazine (PHENERGAN) injection (IM or IVPB) **OR** promethazine   Vital Signs    Vitals:   01/28/21 0449 01/28/21 0500 01/28/21 0600 01/28/21 0755  BP:  134/74 (!) 147/84   Pulse: 99 (!) 102 99   Resp: (!) 26 (!) 23 (!) 25   Temp:      TempSrc:      SpO2: 96% 93% 96% 95%  Weight:      Height:        Intake/Output Summary (Last 24 hours) at 01/28/2021 1207 Last data filed at 01/28/2021 0537 Gross per 24 hour  Intake 249.96 ml  Output 1750 ml  Net -1500.04 ml   Last 3 Weights  01/24/2021 01/15/2021 10/02/2020  Weight (lbs) 224 lb 13.9 oz 235 lb 240 lb  Weight (kg) 102 kg 106.595 kg 108.863 kg      Telemetry    Normal sinus rhythm- Personally Reviewed  ECG     - Personally Reviewed  Physical Exam   Constitutional: Alert, no distress HENT:  Head: Grossly normal Eyes:  no discharge. No scleral icterus.  Neck: Unable to estimate JVD, no carotid bruits  Cardiovascular: Regular rate and rhythm, no murmurs appreciated Pulmonary/Chest: Scattered Rales Abdominal: Soft.  no distension.  no tenderness.  Musculoskeletal: Normal range of motion Neurological:  normal muscle tone. Coordination normal. No atrophy Skin: Skin warm and dry Psychiatric: Lethargic    Labs    High Sensitivity Troponin:   Recent Labs  Lab 01/20/21 1336 01/20/21 1515  TROPONINIHS 11 12      Chemistry Recent Labs  Lab 01/23/21 0426 01/24/21 0414 01/26/21 0917 01/27/21 0528 01/28/21 0500  NA 134*   < > 136 130* 135  K 3.5   < > 3.9 3.9 3.3*  CL 89*   < > 96* 93* 98  CO2 33*   < > 29 28 29   GLUCOSE 113*   < > 130* 117* 122*  BUN 22   < > 37* 29* 28*  CREATININE 0.77   < > 1.16* 0.92 0.98  CALCIUM 8.8*   < > 8.5* 8.2* 8.3*  PROT 7.6  --   --   --   --  ALBUMIN 3.0*  --   --   --   --   AST 26  --   --   --   --   ALT 11  --   --   --   --   ALKPHOS 60  --   --   --   --   BILITOT 1.9*  --   --   --   --   GFRNONAA >60   < > 46* >60 57*  ANIONGAP 12   < > 11 9 8    < > = values in this interval not displayed.     Hematology Recent Labs  Lab 01/26/21 0917 01/27/21 0528 01/28/21 0500  WBC 8.7 8.9 8.8  RBC 4.95 4.76 4.52  HGB 11.8* 11.3* 11.0*  HCT 37.8 36.4 34.8*  MCV 76.4* 76.5* 77.0*  MCH 23.8* 23.7* 24.3*  MCHC 31.2 31.0 31.6  RDW 19.9* 19.9* 20.1*  PLT 165 170 175    BNP Recent Labs  Lab 01/22/21 1640  BNP 146.7*     DDimer  No results for input(s): DDIMER in the last 168 hours.    Radiology    No results found.  Cardiac Studies      Patient Profile     87 y.o. female with history of CAD with inferior STEMI in 05/2017 complicated by hypotension and bradycardia status post PCI/DES to the proximal RCA, severe aortic stenosis, GI bleed in 2018, prolonged tobacco use quitting after her MI, HLD, and obesity AAA repair , post-op MRSA infection of left hip  Assessment & Plan     Acute on chronic respiratory distress  Requiring 10 L nasal cannula prior to AAA repair, Post procedure continued respiratory distress Multifactorial including pulmonary fibrosis, COPD, pulmonary hypertension, severe aortic valve stenosis, CHF, deconditioned -Additional dose Lasix 40 mg IV x1 today then start oral Lasix 40 daily Will replete potassium -Continued aggressive management of COPD  AAA repair repair Over 7 cm in size   Successful endovascular repair  Vascular following  Severe aortic valve stenosis   Currently not a candidate for TAVR in current condition with weakness, acute on chronic respiratory failure, left hip infection, severe COPD, pulmonary hypertension -- Work-up for TAVR can be arranged from clinic depending on recovery from this hospitalization and baseline.  Concern is immobility and respiratory failure --- Would recommend palliative care continue to follow     Postsurgical hip infection   Status postdebridement in OR June 7 with intraoperative cultures growing MRSA blood cultures negative x2   -Remains immobile        Total encounter time more than 35 minutes  Greater than 50% was spent in counseling and coordination of care with the patient  [facility] will sign off.   Medication Recommendations: No further changes Other recommendations (labs, testing, etc): No testing Follow up as an outpatient: Outpatient follow-up cardiology clinic   For questions or updates, please contact [facility] Please consult [email or web address] for contact info under        Signed, [id number], MD  01/28/2021, 12:07  PM

## 2021-01-28 NOTE — Progress Notes (Signed)
PROGRESS NOTE    Monica [name]  DJT:[id number] DOB: Jan 12, 1934 DOA: 01/15/2021 PCP: Patient, No Pcp Per (Inactive)   Brief Narrative:  Monica [name] is a 87 y.o. F with CAD s/p PCI x1 in 2018, severe AS being worked up for TAVR, Obesity, pHTN, and COPD who presented with hip pain.   Had hip fracture after fall in Feb, repaired and discharged home.  Since being home 4 months ago, really never able to walk due to pain. Finally, in the last week before admission, developed redness and worse pain at left hip surgical site, then wound dehisced.   Presented to the ER and Orthopedics were consulted for suspected post-surgical hip infection with MRSA.  ID is recommending 4 weeks of vancomycin and rifampin.    Also noted incidentally to have very large AAA, she was taken to the OR for endovascular repair with vascular surgery today.  Also had severe aortic stenosis and plan is for TAVR work-up after AAA repair, most likely as an outpatient if able to get out of hospital from current admission.  She will not be a good candidate due to multiple comorbidities.  Patient with multiple life-threatening comorbidities-high risk for deterioration and death, palliative care was also consulted, patient is now on DNR/DNI.  She will consider comfort care if deteriorates. At this time we will continue current level of care.  Patient had her endovascular AAA repair done with vascular surgery on 01/24/2021, she remained intubated and in ICU for 2 days after that, extubated yesterday and we will resume care again from today.  Remained on heated high flow, weak cough and appears very lethargic.  6/18: Appears more lethargic with worsening oxygen requirement, today at 50 L and 65% FiO2  6/19: Little improvement in FiO2, started taking few sips here and there.  Subjective: Patient appears very lethargic, complaining of lower abdominal pain.  No nausea or vomiting.  Denies any chest pain or shortness of breath but  remained on high levels of oxygen.  P.o. intake remains poor but she started drinking couple of sips here and there.  Assessment & Plan:   Active Problems:   Abscess of left hip   Abdominal aortic aneurysm (AAA) without rupture (HCC)   Obesity, Class III, BMI 40-49.9 (morbid obesity) (HCC)   Pre-op evaluation   Shortness of breath   Hypoxia  Postsurgical hip infection S/p debridement of the left hip wound by Dr. Allena Katz, 6/7 Admitted and taken to OR 6/7 for washout.  Intraoperative cultures growing MRSA in 3/3. ID consulted, recommended treating with 4 weeks antibiotics for presumed MRSA prosthetic infection.  1/2 blood cultures positive for staph epidermidis which was thought to be a contaminant. 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  Patient was evaluated by vascular surgery, who recommended urgent repair given the size.  She is not a candidate for open repair.  She was taken to the OR for endovascular repair-tolerated the procedure well, stable from vascular standpoint and will follow-up as an outpatient. -Appreciate vascular help.  Lower abdominal pain.  Operative sites looks okay.  Patient did not had any bowel movement for many days. -Regular bowel regimen -Continue with pain management  Severe aortic stenosis Previously known, since hip fracture in Feb.  Was pending TAVR eval prior to this admission, seen by Cardiology here.  -No plan to start TAVR work-up while she is in the hospital due to other comorbidities and life-threatening conditions.  Most likely will be done as an outpatient.    Pulmonary hypertension due to AS, CHF   Hypotension due to aortic stenosis -Continue midodrine   Chronic diastolic CHF -Defer diuretic to Cardiology-Lasix was held initially for the past couple of days, worsening hypoxia with basal crackles-1 dose of Lasix ordered.  Acute on chronic hypoxic respiratory  failure.  Mild improvement in FiO2 requirement today.  Otherwise remained on heated high flow at 45 L and 50% FiO2. Has chronic hypoxic respiratory failure due to aortic stenosis, heart failure, and pulmonary hypertension.  COPD is in chart but appears to be mild. Former smoker.  Uses 2 L of oxygen at baseline.  Initially requiring 2 to 3 L and then got worse requiring up to 10 L starting from 6/12. Chest x-ray unremarkable, CT angiogram of the chest with patchy subpleural fibrosis, no PE, moderate atelectasis, no edema or large consolidation, no effusion. Patient required prolonged intubation after the procedure, extubated yesterday but continued to require very high levels of oxygen. - Consult to pulmonogy-appreciate their help - Continue Solumedrol BID for NSIP    Coronary artery disease, secondary prevention - Continue Crestor   Iron deficiency anemia Hgb stable Given Feraheme 6/10 -Transfusion threshold 8 g/dL  Hyponatremia.  Resolved Mild, asymptomatic  Vaginal candidiasis Given Fluconazole 1 dose on 6/10.   Left renal nodule Incidental finding, minor, given gravity of AAA and AS and hypoxia, this likely has not been discussed with patient and should be followed up only depending on goals of care after AAA repair - Follow-up MRI as an outpatient  Class II obesity. Estimated body mass index is 37.42 kg/m as calculated from the following:   Height as of this encounter: 5\' 5"  (1.651 m).   Weight as of this encounter: 102 kg.   Goals of care.  Patient with multiple life limiting comorbidities, very high risk for deterioration and death.  Palliative care is involved.  Currently we will continue current level of care but if she deteriorates they will consider comfort care.  She is now DNR/DNI  Objective: Vitals:   01/28/21 1000 01/28/21 1100 01/28/21 1200 01/28/21 1316  BP: 117/90 139/75 139/80   Pulse: (!) 108 (!) 110 (!) 113   Resp: (!) 28 (!) 28 (!) 28   Temp:      TempSrc:    (P) Oral   SpO2: 95% 94% 94% 96%  Weight:      Height:        Intake/Output Summary (Last 24 hours) at 01/28/2021 1358 Last data filed at 01/28/2021 0537 Gross per 24 hour  Intake --  Output 1750 ml  Net -1750 ml    Filed Weights   01/15/21 1638 01/24/21 1500  Weight: 106.6 kg 102 kg    Examination:  General.  Lethargic elderly lady, in no acute distress. Pulmonary.  Lungs clear bilaterally, normal respiratory effort. CV.  Sinus tachycardia Abdomen.  Soft, nontender, nondistended, BS positive.  Clean bandages in pannus CNS.  Alert and oriented .  No focal neurologic deficit. Extremities.  No edema, no cyanosis, pulses intact and symmetrical. Psychiatry.  Judgment and insight appears normal.   DVT prophylaxis: Lovenox Code Status: DNR Family Communication: Called daughter with no response. Disposition Plan:  Status is: Inpatient  Remains inpatient appropriate because:Inpatient level of care appropriate due to severity of illness  Dispo: The patient is from:  Home              Anticipated d/c is to:  To  be determined              Patient currently is not medically stable to d/c.   Difficult to place patient No                Level of care: Progressive Cardiac  All the records are reviewed and case discussed with Care Management/Social Worker. Management plans discussed with the patient, nursing and they are in agreement.  Consultants:  Vascular surgery Pulmonology Cardiology  Procedures:  Antimicrobials:  Vancomycin Rifampin  Data Reviewed: I have personally reviewed following labs and imaging studies  CBC: Recent Labs  Lab 01/24/21 0414 01/25/21 0534 01/26/21 0917 01/27/21 0528 01/28/21 0500  WBC 4.2 7.4 8.7 8.9 8.8  HGB 12.9 11.6* 11.8* 11.3* 11.0*  HCT 41.8 37.7 37.8 36.4 34.8*  MCV 76.8* 77.1* 76.4* 76.5* 77.0*  PLT 251 180 165 170 175    Basic Metabolic Panel: Recent Labs  Lab 01/24/21 0414 01/25/21 0534 01/26/21 0917 01/27/21 0528  01/28/21 0500  NA 133* 135 136 130* 135  K 3.2* 4.6 3.9 3.9 3.3*  CL 90* 95* 96* 93* 98  CO2 33* 31 29 28 29   GLUCOSE 92 121* 130* 117* 122*  BUN 25* 29* 37* 29* 28*  CREATININE 0.74 0.93  1.02* 1.16* 0.92 0.98  CALCIUM 8.6* 8.5* 8.5* 8.2* 8.3*  MG  --   --   --  2.4 2.2  PHOS  --   --   --  2.8 2.3*    GFR: Estimated Creatinine Clearance: 49.7 mL/min (by C-G formula based on SCr of 0.98 mg/dL). Liver Function Tests: Recent Labs  Lab 01/23/21 0426  AST 26  ALT 11  ALKPHOS 60  BILITOT 1.9*  PROT 7.6  ALBUMIN 3.0*    No results for input(s): LIPASE, AMYLASE in the last 168 hours. No results for input(s): AMMONIA in the last 168 hours. Coagulation Profile: No results for input(s): INR, PROTIME in the last 168 hours. Cardiac Enzymes: No results for input(s): CKTOTAL, CKMB, CKMBINDEX, TROPONINI in the last 168 hours. BNP (last 3 results) No results for input(s): PROBNP in the last 8760 hours. HbA1C: No results for input(s): HGBA1C in the last 72 hours. CBG: Recent Labs  Lab 01/24/21 1515  GLUCAP 141*    Lipid Profile: No results for input(s): CHOL, HDL, LDLCALC, TRIG, CHOLHDL, LDLDIRECT in the last 72 hours. Thyroid Function Tests: No results for input(s): TSH, T4TOTAL, FREET4, T3FREE, THYROIDAB in the last 72 hours. Anemia Panel: No results for input(s): VITAMINB12, FOLATE, FERRITIN, TIBC, IRON, RETICCTPCT in the last 72 hours. Sepsis Labs: No results for input(s): PROCALCITON, LATICACIDVEN in the last 168 hours.  No results found for this or any previous visit (from the past 240 hour(s)).    Radiology Studies: No results found.  Scheduled Meds:  Chlorhexidine Gluconate Cloth  6 each Topical Daily   diltiazem  120 mg Oral Daily   docusate  100 mg Per Tube BID   enoxaparin (LOVENOX) injection  40 mg Subcutaneous Q24H   [START ON 01/29/2021] furosemide  40 mg Oral Daily   ipratropium-albuterol  3 mL Nebulization Q6H   methylPREDNISolone (SOLU-MEDROL)  injection  40 mg Intravenous Daily   multivitamin with minerals  1 tablet Oral Daily   mupirocin ointment  1 application Nasal BID   pantoprazole (PROTONIX) IV  40 mg Intravenous Daily   pneumococcal 23 valent vaccine  0.5 mL Intramuscular Tomorrow-1000   polyethylene glycol  17 g Per Tube Daily  potassium chloride  40 mEq Oral Once   rifampin  300 mg Oral BID WC   Continuous Infusions:  sodium chloride 10 mL/hr at 01/27/21 1300   potassium PHOSPHATE IVPB (in mmol) 15 mmol (01/28/21 0824)   promethazine (PHENERGAN) injection (IM or IVPB) Stopped (01/26/21 1902)   vancomycin 1,000 mg (01/28/21 1014)     LOS: 13 days   Time spent: 35 minutes More than 50% of the time was spent in counseling/coordination of care  Arnetha Courser, MD [facility]  If 7PM-7AM, please contact night-coverage [email or web address]  01/28/2021, 1:58 PM   This record has been created using Conservation officer, historic buildings. Errors have been sought and corrected,but may not always be located. Such creation errors do not reflect on the standard of care.

## 2021-01-28 NOTE — Progress Notes (Signed)
   01/28/21 2006  Assess: MEWS Score  Temp (!) 97.4 F (36.3 C)  BP 125/76  Pulse Rate (!) 109  ECG Heart Rate (!) 110  Resp (!) 24  SpO2 94 %  O2 Device HFNC;Other (Comment)  Assess: MEWS Score  MEWS Temp 0  MEWS Systolic 0  MEWS Pulse 1  MEWS RR 1  MEWS LOC 0  MEWS Score 2  MEWS Score Color Yellow  Assess: if the MEWS score is Yellow or Red  Were vital signs taken at a resting state? Yes  Focused Assessment No change from prior assessment (Vital signs will be taken in an hour.)  Early Detection of Sepsis Score *See Row Information* Low  MEWS guidelines implemented *See Row Information* No, previously yellow, continue vital signs every 4 hours  Treat  MEWS Interventions Administered scheduled meds/treatments  Pain Scale 0-10  Pain Score 3  Pain Type Acute pain  Pain Location Abdomen  Pain Orientation Left  Pain Descriptors / Indicators Aching  Pain Frequency Intermittent  Pain Onset On-going  Patients Stated Pain Goal 3  Pain Intervention(s) Medication (See eMAR)  Multiple Pain Sites No  Complains of Anxiety  Neuro symptoms relieved by Anti-anxiety medication  Take Vital Signs  Increase Vital Sign Frequency   (Patient previously yellow)  Notify: Charge Nurse/RN  Name of Charge Nurse/RN Notified Kara, RN  Date Charge Nurse/RN Notified 01/28/21  Time Charge Nurse/RN Notified 2015  Document  Progress note created (see row info) Yes

## 2021-01-28 NOTE — Progress Notes (Signed)
Pharmacy Antibiotic Note  Monica [name] is a 87 y.o. female admitted on 01/15/2021 with infected hip following IM nail in February.  Pharmacy has been consulted for Vancomycin Dosing. S/p debridement of the left hip wound by  6/7 with MRSA from OR cultures.  Also on rifampin for presence of foreign material/hardware.    Today, 01/28/2021 Day #13 vancomycin and Day #10 rifampin Renal: SCr = 1.16>0.92>0.98 AAA repair performed 6/15 WBC WNL  afeb LFTs WNL on 6/13 Vancomycin trough 6/16 @ 2058 = 21 mcg/ml Vancomycin random level  6/17 @ 0417 = 15 mcg/ml Since no give given between levels, Ke = 0.0463 for half-life = 15hr Vanc random level 6/18 @ 0528= 17 mcg/ml Vanc random level 6/19 @0500  = 17 mcg/ml    Plan: Begin Vancomycin 1gm IV Q24h as renal fxn appears to be stabilizing.   Goal AUC 400-600 Monitor renal function  Monitor LFTs weekly     Height: 5\' 5"  (165.1 cm) Weight: 102 kg (224 lb 13.9 oz) IBW/kg (Calculated) : 57  Temp (24hrs), Avg:98.1 F (36.7 C), Min:97.6 F (36.4 C), Max:98.3 F (36.8 C)  Recent Labs  Lab 01/24/21 0414 01/25/21 0534 01/25/21 2058 01/26/21 0417 01/26/21 0917 01/27/21 0528 01/28/21 0500  WBC 4.2 7.4  --   --  8.7 8.9 8.8  CREATININE 0.74 0.93  1.02*  --   --  1.16* 0.92 0.98  VANCOTROUGH  --   --  21*  --   --   --   --   VANCORANDOM  --   --   --    < >  --  17 17   < > = values in this interval not displayed.     Estimated Creatinine Clearance: 49.7 mL/min (by C-G formula based on SCr of 0.98 mg/dL).    No Known Allergies  Antimicrobials this admission: zosyn x1 dose 6/6 cefepime 6/7>>6/9 Vanc 6/6 (evening)>> Rifampin 6/10>>   Microbiology results:  6/6 BCx: 2of4 (1 set)= GPC=Staph epi, sens pending 6/7 Wound x 3: MRSA 6/8 Bcx: NG  Thank you for allowing pharmacy to be a part of this patient's care.  8/7 PharmD Clinical Pharmacist 01/28/2021

## 2021-01-29 ENCOUNTER — Inpatient Hospital Stay (HOSPITAL_COMMUNITY)
Admit: 2021-01-29 | Discharge: 2021-01-29 | Disposition: A | Discharge status: Home / Self Care | Payer: Medicare Other | Attending: Nurse Practitioner | Admitting: Nurse Practitioner

## 2021-01-29 DIAGNOSIS — I714 Abdominal aortic aneurysm, without rupture: Secondary | ICD-10-CM | POA: Diagnosis not present

## 2021-01-29 DIAGNOSIS — R079 Chest pain, unspecified: Secondary | ICD-10-CM | POA: Diagnosis not present

## 2021-01-29 DIAGNOSIS — L02416 Cutaneous abscess of left lower limb: Secondary | ICD-10-CM | POA: Diagnosis not present

## 2021-01-29 DIAGNOSIS — I214 Non-ST elevation (NSTEMI) myocardial infarction: Secondary | ICD-10-CM | POA: Diagnosis not present

## 2021-01-29 DIAGNOSIS — J9621 Acute and chronic respiratory failure with hypoxia: Secondary | ICD-10-CM | POA: Diagnosis not present

## 2021-01-29 DIAGNOSIS — Z515 Encounter for palliative care: Secondary | ICD-10-CM | POA: Diagnosis not present

## 2021-01-29 DIAGNOSIS — R0602 Shortness of breath: Secondary | ICD-10-CM | POA: Diagnosis not present

## 2021-01-29 DIAGNOSIS — I350 Nonrheumatic aortic (valve) stenosis: Secondary | ICD-10-CM | POA: Diagnosis not present

## 2021-01-29 DIAGNOSIS — Z7189 Other specified counseling: Secondary | ICD-10-CM | POA: Diagnosis not present

## 2021-01-29 LAB — TROPONIN I (HIGH SENSITIVITY)
Troponin I (High Sensitivity): 1374 ng/L (ref <18)
Troponin I (High Sensitivity): 1349 ng/L (ref <18)

## 2021-01-29 LAB — HEPARIN LEVEL (UNFRACTIONATED): Heparin Unfractionated: 0.31 IU/mL (ref 0.30–0.70)

## 2021-01-29 LAB — MAGNESIUM: Magnesium: 2 mg/dL (ref 1.7–2.4)

## 2021-01-29 LAB — APTT: aPTT: 37 seconds — ABNORMAL HIGH (ref 24–36)

## 2021-01-29 LAB — ECHOCARDIOGRAM COMPLETE
AR max vel: 0.57 cm2
AV Area VTI: 0.54 cm2
AV Area mean vel: 0.55 cm2
AV Mean grad: 32 mmHg
AV Peak grad: 45.1 mmHg
Ao pk vel: 3.36 m/s
Area-P 1/2: 4.26 cm2
Height: 65 in
MV VTI: 1.23 cm2
S' Lateral: 2.83 cm
Weight: 3597.91 oz

## 2021-01-29 LAB — BASIC METABOLIC PANEL
Anion gap: 9 (ref 5–15)
BUN: 26 mg/dL — ABNORMAL HIGH (ref 8–23)
CO2: 28 mmol/L (ref 22–32)
Calcium: 8.3 mg/dL — ABNORMAL LOW (ref 8.9–10.3)
Chloride: 93 mmol/L — ABNORMAL LOW (ref 98–111)
Creatinine, Ser: 0.91 mg/dL (ref 0.44–1.00)
GFR, Estimated: >60 mL/min (ref >60)
Glucose, Bld: 132 mg/dL — ABNORMAL HIGH (ref 70–99)
Potassium: 3.7 mmol/L (ref 3.5–5.1)
Sodium: 130 mmol/L — ABNORMAL LOW (ref 135–145)

## 2021-01-29 LAB — CBC
HCT: 36.4 % (ref 36.0–46.0)
Hemoglobin: 11.6 g/dL — ABNORMAL LOW (ref 12.0–15.0)
MCH: 23.8 pg — ABNORMAL LOW (ref 26.0–34.0)
MCHC: 31.9 g/dL (ref 30.0–36.0)
MCV: 74.7 fL — ABNORMAL LOW (ref 80.0–100.0)
Platelets: 212 10*3/uL (ref 150–400)
RBC: 4.87 MIL/uL (ref 3.87–5.11)
RDW: 20.8 % — ABNORMAL HIGH (ref 11.5–15.5)
WBC: 9.5 10*3/uL (ref 4.0–10.5)
nRBC: 0 % (ref 0.0–0.2)

## 2021-01-29 LAB — PHOSPHORUS: Phosphorus: 2.5 mg/dL (ref 2.5–4.6)

## 2021-01-29 LAB — PROTIME-INR
INR: 1.4 — ABNORMAL HIGH (ref 0.8–1.2)
Prothrombin Time: 17.4 seconds — ABNORMAL HIGH (ref 11.4–15.2)

## 2021-01-29 MED ORDER — HEPARIN (PORCINE) 25000 UT/250ML-% IV SOLN
1500.0000 [IU]/h | INTRAVENOUS | Status: DC
  Administered 2021-01-29: 1000 [IU]/h via INTRAVENOUS
  Administered 2021-01-30: 1300 [IU]/h via INTRAVENOUS
  Administered 2021-01-31: 1500 [IU]/h via INTRAVENOUS
  Filled 2021-01-29 (×4): qty 250

## 2021-01-29 MED ORDER — METOPROLOL TARTRATE 25 MG PO TABS
25.0000 mg | ORAL_TABLET | Freq: Two times a day (BID) | ORAL | Status: DC
  Administered 2021-01-29 – 2021-01-30 (×4): 25 mg via ORAL
  Filled 2021-01-29 (×4): qty 1

## 2021-01-29 MED ORDER — ASPIRIN 81 MG PO CHEW
81.0000 mg | CHEWABLE_TABLET | Freq: Every day | ORAL | Status: DC
  Administered 2021-01-29 – 2021-02-10 (×13): 81 mg via ORAL
  Filled 2021-01-29 (×13): qty 1

## 2021-01-29 MED ORDER — SODIUM CHLORIDE 0.9% FLUSH
10.0000 mL | INTRAVENOUS | Status: DC | PRN
  Administered 2021-01-29: 30 mL

## 2021-01-29 MED ORDER — SODIUM CHLORIDE 0.9% FLUSH
10.0000 mL | Freq: Two times a day (BID) | INTRAVENOUS | Status: DC
  Administered 2021-01-29: 20 mL
  Administered 2021-01-30 – 2021-01-31 (×4): 10 mL
  Administered 2021-02-01: 30 mL
  Administered 2021-02-01: 10 mL
  Administered 2021-02-02: 30 mL
  Administered 2021-02-02 – 2021-02-05 (×5): 10 mL
  Administered 2021-02-06: 3 mL
  Administered 2021-02-06 – 2021-02-09 (×6): 10 mL

## 2021-01-29 MED ORDER — ROSUVASTATIN CALCIUM 10 MG PO TABS
40.0000 mg | ORAL_TABLET | Freq: Every day | ORAL | Status: DC
  Administered 2021-01-29 – 2021-01-31 (×3): 40 mg via ORAL
  Filled 2021-01-29 (×3): qty 4

## 2021-01-29 MED ORDER — CLOPIDOGREL BISULFATE 75 MG PO TABS
75.0000 mg | ORAL_TABLET | Freq: Every day | ORAL | Status: DC
  Administered 2021-01-29 – 2021-02-10 (×13): 75 mg via ORAL
  Filled 2021-01-29 (×13): qty 1

## 2021-01-29 NOTE — Plan of Care (Signed)
  Problem: Education: Goal: Knowledge of General Education information will improve Description: Including pain rating scale, medication(s)/side effects and non-pharmacologic comfort measures 01/29/2021 0309 by Sheria Lang, RN Outcome: Progressing 01/29/2021 0309 by Sheria Lang, RN Outcome: Progressing   Problem: Health Behavior/Discharge Planning: Goal: Ability to manage health-related needs will improve 01/29/2021 0309 by Sheria Lang, RN Outcome: Progressing 01/29/2021 0309 by Sheria Lang, RN Outcome: Progressing   Problem: Clinical Measurements: Goal: Ability to maintain clinical measurements within normal limits will improve 01/29/2021 0309 by Sheria Lang, RN Outcome: Progressing 01/29/2021 0309 by Sheria Lang, RN Outcome: Progressing Goal: Will remain free from infection 01/29/2021 0309 by Sheria Lang, RN Outcome: Progressing 01/29/2021 0309 by Sheria Lang, RN Outcome: Progressing Goal: Diagnostic test results will improve 01/29/2021 0309 by Sheria Lang, RN Outcome: Progressing 01/29/2021 0309 by Sheria Lang, RN Outcome: Progressing Goal: Respiratory complications will improve 01/29/2021 0309 by Sheria Lang, RN Outcome: Progressing 01/29/2021 0309 by Sheria Lang, RN Outcome: Progressing Goal: Cardiovascular complication will be avoided 01/29/2021 0309 by Sheria Lang, RN Outcome: Progressing 01/29/2021 0309 by Sheria Lang, RN Outcome: Progressing   Problem: Activity: Goal: Risk for activity intolerance will decrease 01/29/2021 0309 by Sheria Lang, RN Outcome: Progressing 01/29/2021 0309 by Sheria Lang, RN Outcome: Progressing   Problem: Nutrition: Goal: Adequate nutrition will be maintained 01/29/2021 0309 by Sheria Lang, RN Outcome: Progressing 01/29/2021 0309 by Sheria Lang, RN Outcome: Progressing   Problem: Coping: Goal: Level of anxiety will decrease 01/29/2021 0309 by Sheria Lang, RN Outcome: Progressing 01/29/2021 0309 by Sheria Lang, RN Outcome: Progressing    Problem: Elimination: Goal: Will not experience complications related to bowel motility 01/29/2021 0309 by Sheria Lang, RN Outcome: Progressing 01/29/2021 0309 by Sheria Lang, RN Outcome: Progressing Goal: Will not experience complications related to urinary retention 01/29/2021 0309 by Sheria Lang, RN Outcome: Progressing 01/29/2021 0309 by Sheria Lang, RN Outcome: Progressing   Problem: Pain Managment: Goal: General experience of comfort will improve 01/29/2021 0309 by Sheria Lang, RN Outcome: Progressing 01/29/2021 0309 by Sheria Lang, RN Outcome: Progressing   Problem: Safety: Goal: Ability to remain free from injury will improve 01/29/2021 0309 by Sheria Lang, RN Outcome: Progressing 01/29/2021 0309 by Sheria Lang, RN Outcome: Progressing   Problem: Skin Integrity: Goal: Risk for impaired skin integrity will decrease 01/29/2021 0309 by Sheria Lang, RN Outcome: Progressing 01/29/2021 0309 by Sheria Lang, RN Outcome: Progressing

## 2021-01-29 NOTE — Consult Note (Signed)
ANTICOAGULATION CONSULT NOTE   Pharmacy Consult for Heparin Indication: chest pain/ACS  No Known Allergies  Patient Measurements: Height: 5\' 5"  (165.1 cm) Weight: 102 kg (224 lb 13.9 oz) IBW/kg (Calculated) : 57 Heparin Dosing Weight: 80.5 kg   Vital Signs: Temp: 97.7 F (36.5 C) (06/20 1100) Temp Source: Oral (06/20 1100) BP: 104/75 (06/20 1100) Pulse Rate: 110 (06/20 1100)  Labs: Recent Labs    01/27/21 0528 01/28/21 0500 01/29/21 0455 01/29/21 1054  HGB 11.3* 11.0* 11.6*  --   HCT 36.4 34.8* 36.4  --   PLT 170 175 212  --   CREATININE 0.92 0.98 0.91  --   TROPONINIHS  --   --   --  1,374*    Estimated Creatinine Clearance: 53.5 mL/min (by C-G formula based on SCr of 0.91 mg/dL).   Medical History: Past Medical History:  Diagnosis Date   Aortic stenosis    a. LHC 06/02/17: At least moderate aortic stenosis with a peak to peak gradient of 22 mmHg; b. TTE 05/2017: EF 55-60%, mild HK basal-midinferior wall, Gr1DD, mod to sev AS w/ mean gradient 21 mmHg, valve area 0.99, mild MR, mildly dilated LA    CAD (coronary artery disease) 2018   a. inferior STEMI 06/02/2017: LHC 06/02/17: LM 20, D1 20%, o-pLCx 50, p-mRCA 100% s/p PCI/DES, mRCA 50, dRCA 30   GI bleed    a. noted 06/05/2017   HLD (hyperlipidemia)    Obesity     Medications:  Medications Prior to Admission  Medication Sig Dispense Refill Last Dose   aspirin 81 MG EC tablet Take 81 mg by mouth daily.   Past Week   carvedilol (COREG) 3.125 MG tablet Take 3.125 mg by mouth 2 (two) times daily with a meal.   Past Week at unknown   oxyCODONE (OXY IR/ROXICODONE) 5 MG immediate release tablet Take 5 mg by mouth every 4 (four) hours as needed for severe pain.   prn at prn   rosuvastatin (CRESTOR) 10 MG tablet Take 1 tablet by mouth once daily (Patient taking differently: Take 10 mg by mouth daily.) 90 tablet 0 Past Week   [DISCONTINUED] enoxaparin (LOVENOX) 40 MG/0.4ML injection Inject 0.4 mLs (40 mg total) into  the skin daily for 14 days. (Patient not taking: Reported on 01/22/2021) 5.6 mL 0 Not Taking   Scheduled:   aspirin  81 mg Oral Daily   Chlorhexidine Gluconate Cloth  6 each Topical Daily   clopidogrel  75 mg Oral Daily   docusate  100 mg Per Tube BID   furosemide  40 mg Oral Daily   ipratropium-albuterol  3 mL Nebulization Q6H   methylPREDNISolone (SOLU-MEDROL) injection  40 mg Intravenous Daily   metoprolol tartrate  25 mg Oral BID   multivitamin with minerals  1 tablet Oral Daily   mupirocin ointment  1 application Nasal BID   pantoprazole (PROTONIX) IV  40 mg Intravenous Daily   pneumococcal 23 valent vaccine  0.5 mL Intramuscular Tomorrow-1000   polyethylene glycol  17 g Oral Daily   rifampin  300 mg Oral BID WC   rosuvastatin  40 mg Oral Daily   Infusions:   sodium chloride 250 mL (01/29/21 1103)   promethazine (PHENERGAN) injection (IM or IVPB) Stopped (01/26/21 1902)   vancomycin 1,000 mg (01/29/21 1105)   PRN: bisacodyl, diazepam, ipratropium-albuterol, lip balm, [DISCONTINUED] metoCLOPramide **OR** metoCLOPramide (REGLAN) injection, morphine injection, nitroGLYCERIN, [DISCONTINUED] promethazine **OR** promethazine (PHENERGAN) injection (IM or IVPB) **OR** promethazine  Assessment: Pharmacy consulted to  start heparin for ACS. No DOAC PTA. Trop 1374. Pt received enoxaparin 40 mg 6/20 0839. Heparin level may be a little high due to enoxaparin.    Goal of Therapy:  Heparin level 0.3-0.7 units/ml Monitor platelets by anticoagulation protocol: Yes   Plan:  No bolus  Start heparin infusion at 1000 units/hr Check anti-Xa level in 8 hours and daily while on heparin Continue to monitor H&H and platelets  Ronnald Ramp, PharmD, BCPS 01/29/2021,12:31 PM

## 2021-01-29 NOTE — Plan of Care (Signed)

## 2021-01-29 NOTE — Progress Notes (Addendum)
PT Cancellation Note  Patient Details Name: Monica [name] MRN: [id number] DOB: Apr 04, 1934   Cancelled Treatment:    Reason Eval/Treat Not Completed: Patient not medically ready Chart reviewed & pt noted to have elevated troponin with new c/o chest pain (per chart). Spoke with MD via secure chat who requests PT hold tx today. Will f/u as able & as pt is medically appropriate to participate.  Aleda Grana, PT, DPT 01/29/21, 11:50 AM    [name] 01/29/2021, 11:49 AM

## 2021-01-29 NOTE — Progress Notes (Addendum)
OT Cancellation Note  Patient Details Name: Monica [name] MRN: [id number] DOB: February 02, 1934   Cancelled Treatment:    Reason Eval/Treat Not Completed: Patient declined, no reason specified;Other (comment) Pt declines to participate in OT Treatment this date, citing "feeling awful". Pt noted to have elevated troponin in chart as well. Will hold at this time. Thank you.  Rejeana Brock, MS, OTR/L ascom [phone] 01/29/21, 12:46 PM

## 2021-01-29 NOTE — Progress Notes (Signed)
Daily Progress Note   Patient Name: Monica [name]       Date: 01/29/2021 DOB: [birth year]  Age: 87 y.o. MRN#: [id number] Attending Physician: Arnetha Courser, MD Primary Care Physician: Patient, No Pcp Per (Inactive) Admit Date: 01/15/2021  Reason for Consultation/Follow-up: Establishing goals of care  Subjective: Patient is resting in bed. She speaks in broken sentences. High flow O2 in place. She states she is not really feeling better and states "I know I've got a long rough road ahead of me." Discussed her poor functional status prior to this hospitalization, and how that impacts her currently and in the future. Discussed her TAVR and planning. She then states she has been having chest pain (rubbing her sternal area) since this morning but cannot tell me a definitve time it started. She states nothing makes it better or worse, she states it feels like a constant pressure. She is also having abdominal pain and is s/p AAA repair. At this time attending MD came in to see patient. Discussed her chest pain. She states she would like work up for her chest pain as indicated. I will continue to follow.   Length of Stay: 14  Current Medications: Scheduled Meds:  . Chlorhexidine Gluconate Cloth  6 each Topical Daily  . diltiazem  120 mg Oral Daily  . docusate  100 mg Per Tube BID  . enoxaparin (LOVENOX) injection  40 mg Subcutaneous Q24H  . furosemide  40 mg Oral Daily  . ipratropium-albuterol  3 mL Nebulization Q6H  . methylPREDNISolone (SOLU-MEDROL) injection  40 mg Intravenous Daily  . multivitamin with minerals  1 tablet Oral Daily  . mupirocin ointment  1 application Nasal BID  . pantoprazole (PROTONIX) IV  40 mg Intravenous Daily  . pneumococcal 23 valent vaccine  0.5 mL Intramuscular  Tomorrow-1000  . polyethylene glycol  17 g Oral Daily  . rifampin  300 mg Oral BID WC    Continuous Infusions: . sodium chloride Stopped (01/28/21 1014)  . promethazine (PHENERGAN) injection (IM or IVPB) Stopped (01/26/21 1902)  . vancomycin      PRN Meds: bisacodyl, diazepam, ipratropium-albuterol, lip balm, [DISCONTINUED] metoCLOPramide **OR** metoCLOPramide (REGLAN) injection, morphine injection, nitroGLYCERIN, [DISCONTINUED] promethazine **OR** promethazine (PHENERGAN) injection (IM or IVPB) **OR** promethazine  Physical Exam Pulmonary:     Comments: On high flow cannula.  Neurological:     Mental Status: She is alert.            Vital Signs: BP 125/78 (BP Location: Left Arm)   Pulse (!) 113   Temp 97.8 F (36.6 C) (Oral)   Resp 18   Ht 5\' 5"  (1.651 m)   Wt 102 kg   SpO2 91%   BMI 37.42 kg/m  SpO2: SpO2: 91 % O2 Device: O2 Device: High Flow Nasal Cannula O2 Flow Rate: O2 Flow Rate (L/min): 50 L/min  Intake/output summary:  Intake/Output Summary (Last 24 hours) at 01/29/2021 1049 Last data filed at 01/29/2021 0900 Gross per 24 hour  Intake 950.32 ml  Output 2200 ml  Net -1249.68 ml   LBM: Last BM Date: 01/25/21 Baseline Weight: Weight: 106.6 kg Most recent weight: Weight: 102 kg          Patient Active Problem List   Diagnosis Date Noted  . Hypoxia   . Shortness of breath   . Pre-op evaluation   . Abdominal aortic aneurysm (AAA) without rupture (HCC)   . Obesity, Class III, BMI 40-49.9 (morbid obesity) (HCC)   . Abscess of left hip 01/15/2021  . Acute respiratory failure with hypoxia (HCC) 10/05/2020  . Severe aortic stenosis   . Closed comminuted intertrochanteric fracture of proximal end of left femur (HCC) 10/02/2020  . Aortic stenosis   . Obesity   . Cellulitis of left lower extremity 04/29/2018  . GI bleed 06/05/2017  . Nausea vomiting and diarrhea   . Acute ST elevation myocardial infarction (STEMI) of inferior wall (HCC) 06/02/2017  . CAD  (coronary artery disease) 2018    Palliative Care Assessment & Plan    Recommendations/Plan: Continue to treat the treatable.      Code Status:    Code Status Orders  (From admission, onward)           Start     Ordered   01/23/21 1543  Do not attempt resuscitation (DNR)  Continuous       Question Answer Comment  In the event of cardiac or respiratory ARREST Do not call a "code blue"   In the event of cardiac or respiratory ARREST Do not perform Intubation, CPR, defibrillation or ACLS   In the event of cardiac or respiratory ARREST Use medication by any route, position, wound care, and other measures to relive pain and suffering. May use oxygen, suction and manual treatment of airway obstruction as needed for comfort.   Comments MOST form on chart.      01/23/21 1542           Code Status History     Date Active Date Inactive Code Status Order ID Comments User Context   01/15/2021 2309 01/23/2021 1542 Full Code 01/25/2021  [id number] Arville Care, MD Inpatient   10/02/2020 1117 10/09/2020 2150 Full Code 2151  [id number], MD ED   04/30/2018 1018 05/02/2018 1808 DNR 05/04/2018  [id number], MD Inpatient   04/29/2018 1732 04/30/2018 1018 Full Code 05/02/2018  [id number], MD Inpatient   06/05/2017 0521 06/07/2017 1404 Full Code 06/09/2017  [id number], MD Inpatient   06/02/2017 0059 06/04/2017 1932 Full Code 06/06/2017  [id number], MD Inpatient      Advance Directive Documentation    Flowsheet Row Most Recent Value  Type of Advance Directive Healthcare Power of Attorney, Living will  Pre-existing out of facility DNR order (yellow form or pink MOST form) --  "MOST" Form in Place? --  Prognosis:  Poor   Care plan was discussed with attending MD  Thank you for allowing the Palliative Medicine Team to assist in the care of this patient.       Total Time 35 min Prolonged Time Billed  no       Greater than 50%  of this time was spent counseling and  coordinating care related to the above assessment and plan.  Morton Stall, NP  Please contact Palliative Medicine Team phone at [phone] for questions and concerns.

## 2021-01-29 NOTE — Progress Notes (Signed)
Progress Note  Patient Name: Monica [name] Date of Encounter: 01/29/2021  Primary Cardiologist: Lorine Bears, MD  Subjective   Asked to see Monica [name] this AM 2/2 chest pain.  Currently c/p free however, she experience c/p x ~ 1 hr this AM. She eventually notified nurse and was provided sl NTG w/ prompt relief.  ECG from 10:45 w/ subtle ST elevation in V2-V3.  No recurrent c/p. HsTrop just returned elevated @ 1374.  Inpatient Medications    Scheduled Meds:  aspirin  81 mg Oral Daily   Chlorhexidine Gluconate Cloth  6 each Topical Daily   clopidogrel  75 mg Oral Daily   diltiazem  120 mg Oral Daily   docusate  100 mg Per Tube BID   furosemide  40 mg Oral Daily   ipratropium-albuterol  3 mL Nebulization Q6H   methylPREDNISolone (SOLU-MEDROL) injection  40 mg Intravenous Daily   multivitamin with minerals  1 tablet Oral Daily   mupirocin ointment  1 application Nasal BID   pantoprazole (PROTONIX) IV  40 mg Intravenous Daily   pneumococcal 23 valent vaccine  0.5 mL Intramuscular Tomorrow-1000   polyethylene glycol  17 g Oral Daily   rifampin  300 mg Oral BID WC   Continuous Infusions:  sodium chloride 250 mL (01/29/21 1103)   promethazine (PHENERGAN) injection (IM or IVPB) Stopped (01/26/21 1902)   vancomycin 1,000 mg (01/29/21 1105)   PRN Meds: bisacodyl, diazepam, ipratropium-albuterol, lip balm, [DISCONTINUED] metoCLOPramide **OR** metoCLOPramide (REGLAN) injection, morphine injection, nitroGLYCERIN, [DISCONTINUED] promethazine **OR** promethazine (PHENERGAN) injection (IM or IVPB) **OR** promethazine   Vital Signs    Vitals:   01/29/21 0400 01/29/21 0700 01/29/21 0832 01/29/21 1100  BP: 124/77  125/78 104/75  Pulse:   (!) 113 (!) 110  Resp: (!) 24  18 18   Temp: 98.4 F (36.9 C)  97.8 F (36.6 C) 97.7 F (36.5 C)  TempSrc: Axillary  Oral Oral  SpO2: 94% 93% 91% (!) 89%  Weight:      Height:        Intake/Output Summary (Last 24 hours) at 01/29/2021  1212 Last data filed at 01/29/2021 0900 Gross per 24 hour  Intake 950.32 ml  Output 2200 ml  Net -1249.68 ml   Filed Weights   01/15/21 1638 01/24/21 1500 01/28/21 1635  Weight: 106.6 kg 102 kg 102 kg    Physical Exam   GEN: Well nourished, well developed, in no acute distress.  On high flow O2. HEENT: Grossly normal.  Neck: Supple, unable to assess JVP 2/2 line/positioning.   Cardiac: RRR, 3/6 SEM loudest @ the RUSB but heard throughout, no rubs, or gallops. No clubbing, cyanosis.  Trace bilat lower ext edema.   Respiratory:  Respirations regular and unlabored, faint crackles heard throughout. GI: Obese, soft, nontender, nondistended, BS + x 4. MS: no deformity or atrophy. Skin: warm and dry, no rash. Neuro:  Strength and sensation are intact. Psych: AAOx3.  Normal affect.  Labs    Chemistry Recent Labs  Lab 01/23/21 0426 01/24/21 0414 01/27/21 0528 01/28/21 0500 01/29/21 0455  NA 134*   < > 130* 135 130*  K 3.5   < > 3.9 3.3* 3.7  CL 89*   < > 93* 98 93*  CO2 33*   < > 28 29 28   GLUCOSE 113*   < > 117* 122* 132*  BUN 22   < > 29* 28* 26*  CREATININE 0.77   < > 0.92 0.98 0.91  CALCIUM 8.8*   < >  8.2* 8.3* 8.3*  PROT 7.6  --   --   --   --   ALBUMIN 3.0*  --   --   --   --   AST 26  --   --   --   --   ALT 11  --   --   --   --   ALKPHOS 60  --   --   --   --   BILITOT 1.9*  --   --   --   --   GFRNONAA >60   < > >60 57* >60  ANIONGAP 12   < > 9 8 9    < > = values in this interval not displayed.     Hematology Recent Labs  Lab 01/27/21 0528 01/28/21 0500 01/29/21 0455  WBC 8.9 8.8 9.5  RBC 4.76 4.52 4.87  HGB 11.3* 11.0* 11.6*  HCT 36.4 34.8* 36.4  MCV 76.5* 77.0* 74.7*  MCH 23.7* 24.3* 23.8*  MCHC 31.0 31.6 31.9  RDW 19.9* 20.1* 20.8*  PLT 170 175 212    Cardiac Enzymes  Recent Labs  Lab 01/20/21 1336 01/20/21 1515 01/29/21 1054  TROPONINIHS 11 12 1,374*      BNP Recent Labs  Lab 01/22/21 1640  BNP 146.7*    Lipids  Lab Results   Component Value Date   CHOL 143 12/23/2017   HDL 58 12/23/2017   LDLCALC 66 12/23/2017   TRIG 95 12/23/2017   CHOLHDL 2.5 12/23/2017    HbA1c  Lab Results  Component Value Date   HGBA1C 5.7 (H) 06/02/2017    Radiology    No results found.  Telemetry    Sinus tachycardia  - Personally Reviewed  ECG    Sinus tachycardia, 113, LAE, 1 mm ST elevation in V2 and V3 - Personally Reviewed  Cardiac Studies   2D Echocardiogram 2.22.2022   1. Left ventricular ejection fraction, by estimation, is 60 to 65%. The  left ventricle has normal function. Left ventricular endocardial border  not optimally defined to evaluate regional wall motion. There is mild left  ventricular hypertrophy. Left  ventricular diastolic parameters are consistent with Grade I diastolic  dysfunction (impaired relaxation). Elevated left atrial pressure.   2. Right ventricular systolic function is normal. The right ventricular  size is mildly enlarged. Tricuspid regurgitation signal is inadequate for  assessing PA pressure.   3. Left atrial size was mildly dilated.   4. Right atrial size was mildly dilated.   5. The mitral valve is degenerative. Trivial mitral valve regurgitation.  Mild mitral stenosis. Moderate mitral annular calcification.   6. Tricuspid valve regurgitation at least moderate but suboptimally  visualized.   7. The aortic valve is tricuspid. There is moderate calcification of the  aortic valve. There is severe thickening of the aortic valve. Aortic valve  regurgitation is not visualized. Severe aortic valve stenosis. Aortic  valve area, by VTI measures 0.51  cm. Aortic valve mean gradient measures 60.0 mmHg. Aortic valve Vmax  measures 4.83 m/s.   8. The inferior vena cava is normal in size with greater than 50%  respiratory variability, suggesting right atrial pressure of 3 mmHg.   Patient Profile     87 y.o. female with history of CAD with inferior STEMI in 05/2017 complicated by  hypotension and bradycardia status post PCI/DES to the proximal RCA, severe aortic stenosis, GI bleed in 2018, prolonged tobacco use quitting after her MI, HLD, and obesity, who was admitted 6/6 secondary  to hip infection and was incidentally noted to have large AAA, now s/p repair.  Post-op course complicated by ongoing resp failure req high flow O2 in the setting of pulm fibrosis.   Assessment & Plan    1.  NSTEMI/CAD:  pt w/ h/o CAD s/p RCA stenting in 2018.  Prolonged hosp course this admission 2/2 hip infxn, AAA s/p EVARD, and resp failure.  She developed c/p earlier this AM, which eventually went away w/ sl NTG, after about an hour of Ss.  ECG @ 10:45 w/ subtle anteroseptal ST elevation, though she is currently pain free and hemodynamically stable.  We will add asa, plavix, high potency statin, and heparin.  She received PO dilt this AM.  Will d/c in favor of metoprolol, to start this evening.  Will f/u echo to eval for rwma.  Cont to cycle enzymes.  She is a poor candidate for cath - await echo/enzyme trend.  2.  Acute on chronic resp failure:  Remains on O2 via HFNC.  Crackles throughout on exam in setting of fibrosis noted on CT chest on 6/11.  Mgmt per IM.  3.  AAA: s/p EVAR.  Per vasc surgery.  Will f/u as outpt.  4.  Severe Ao Stenosis:  likely a poor TAVR candidate in setting of multiple comorbidities.  Defer TAVR eval to outpt setting.  Will f/u echo in setting of #1.  5.  Post-surgical Hip Infection:  Abx and wound vac mgmt per IM.  Signed, Nicolasa Ducking, NP  01/29/2021, 12:12 PM    For questions or updates, please contact   Please consult [email or web address] for contact info under Cardiology/STEMI.

## 2021-01-29 NOTE — Progress Notes (Addendum)
Date of Admission:  01/15/2021      ID: Monica [name] is a 87 y.o. female  Active Problems:   Abscess of left hip   Abdominal aortic aneurysm (AAA) without rupture (HCC)   Obesity, Class III, BMI 40-49.9 (morbid obesity) (HCC)   Pre-op evaluation   Shortness of breath   Hypoxia   Non-ST elevation (NSTEMI) myocardial infarction [facility])    Not doing well Had chest pain this morning and diagnosed with NSTEMI Also has constipation Has shortness of breath and on HI-Flo  Medications:   aspirin  81 mg Oral Daily   Chlorhexidine Gluconate Cloth  6 each Topical Daily   clopidogrel  75 mg Oral Daily   docusate  100 mg Per Tube BID   furosemide  40 mg Oral Daily   ipratropium-albuterol  3 mL Nebulization Q6H   methylPREDNISolone (SOLU-MEDROL) injection  40 mg Intravenous Daily   metoprolol tartrate  25 mg Oral BID   multivitamin with minerals  1 tablet Oral Daily   mupirocin ointment  1 application Nasal BID   pantoprazole (PROTONIX) IV  40 mg Intravenous Daily   pneumococcal 23 valent vaccine  0.5 mL Intramuscular Tomorrow-1000   polyethylene glycol  17 g Oral Daily   rifampin  300 mg Oral BID WC   rosuvastatin  40 mg Oral Daily    Objective: Vital signs in last 24 hours: Temp:  [97.4 F (36.3 C)-98.7 F (37.1 C)] 97.8 F (36.6 C) (06/20 1300) Pulse Rate:  [106-113] 108 (06/20 1300) Resp:  [18-31] 25 (06/20 1300) BP: (104-134)/(70-81) 112/70 (06/20 1300) SpO2:  [89 %-99 %] 99 % (06/20 1300) FiO2 (%):  [50 %] 50 % (06/20 1452) Weight:  [102 kg] 102 kg (06/19 1635)  PHYSICAL EXAM:  General: Alert, resp distress, appears stated age.  Lungs: b/l crepts bases Heart: Regular rate and rhythm, systolic murmur Abdomen: Soft, non-tender,not distended. Bowel sounds normal. No masses Extremities: left hip - lateral thigh- wound vac. Left groin bruising Neurologic: Grossly non-focal  Lab Results Recent Labs    01/28/21 0500 01/29/21 0455  WBC 8.8 9.5  HGB 11.0* 11.6*  HCT  34.8* 36.4  NA 135 130*  K 3.3* 3.7  CL 98 93*  CO2 29 28  BUN 28* 26*  CREATININE 0.98 0.91    Microbiology:  Studies/Results: ECHOCARDIOGRAM COMPLETE  Result Date: 01/29/2021    ECHOCARDIOGRAM REPORT   Patient Name:   Monica [name] Date of Exam: 01/29/2021 Medical Rec #:  [id number]      Height:       65.0 in Accession #:    [id number]     Weight:       224.9 lb Date of Birth:  Oct 23, 1933      BSA:          2.079 m Patient Age:    87 years       BP:           104/75 mmHg Patient Gender: F              HR:           110 bpm. Exam Location:  [facility] Procedure: Color Doppler and Cardiac Doppler Indications:     Chest pain R07.9  History:         Patient has prior history of Echocardiogram examinations, most                  recent 10/03/2020. CAD. Aortic valve stenosis.  Sonographer:  Irish Lack RT Referring Phys:  [id number] [facility] Diagnosing Phys: Yvonne Kendall MD IMPRESSIONS  1. Left ventricular ejection fraction, by estimation, is 30 to 35%. The left ventricle has moderately decreased function. The left ventricle demonstrates regional wall motion abnormalities (see scoring diagram/findings for description). There is moderate left ventricular hypertrophy. Left ventricular diastolic parameters are indeterminate. There is severe hypokinesis of the left ventricular, mid-apical anteroseptal wall, anterior wall, anterolateral wall and apical segment.  2. Right ventricular systolic function is normal. The right ventricular size is mildly enlarged. There is moderately elevated pulmonary artery systolic pressure.  3. Left atrial size was mildly dilated.  4. The mitral valve is degenerative. Mild mitral valve regurgitation. Mild mitral stenosis. Moderate mitral annular calcification.  5. The aortic valve is tricuspid. There is moderate calcification of the aortic valve. There is severe thickening of the aortic valve. Aortic valve regurgitation is trivial. Severe aortic valve stenosis. Aortic  valve area, by VTI measures 0.54 cm. Aortic valve mean gradient measures 32.0 mmHg.  6. The inferior vena cava is normal in size with greater than 50% respiratory variability, suggesting right atrial pressure of 3 mmHg. FINDINGS  Left Ventricle: Left ventricular ejection fraction, by estimation, is 30 to 35%. The left ventricle has moderately decreased function. The left ventricle demonstrates regional wall motion abnormalities. Severe hypokinesis of the left ventricular, mid-apical anteroseptal wall, anterior wall, anterolateral wall and apical segment. The left ventricular internal cavity size was normal in size. There is moderate left ventricular hypertrophy. Left ventricular diastolic parameters are indeterminate. Right Ventricle: The right ventricular size is mildly enlarged. No increase in right ventricular wall thickness. Right ventricular systolic function is normal. There is moderately elevated pulmonary artery systolic pressure. The tricuspid regurgitant velocity is 3.58 m/s, and with an assumed right atrial pressure of 3 mmHg, the estimated right ventricular systolic pressure is 54.3 mmHg. Left Atrium: Left atrial size was mildly dilated. Right Atrium: Right atrial size was normal in size. Pericardium: There is no evidence of pericardial effusion. Mitral Valve: The mitral valve is degenerative in appearance. There is mild thickening of the mitral valve leaflet(s). Moderate mitral annular calcification. Mild mitral valve regurgitation. Mild mitral valve stenosis. Tricuspid Valve: The tricuspid valve is grossly normal. Tricuspid valve regurgitation is trivial. Aortic Valve: The aortic valve is tricuspid. There is moderate calcification of the aortic valve. There is severe thickening of the aortic valve. Aortic valve regurgitation is trivial. Severe aortic stenosis is present. Aortic valve mean gradient measures 32.0 mmHg. Aortic valve peak gradient measures 45.1 mmHg. Aortic valve area, by VTI measures  0.54 cm. Pulmonic Valve: The pulmonic valve was grossly normal. Pulmonic valve regurgitation is not visualized. No evidence of pulmonic stenosis. Aorta: The aortic root is normal in size and structure. Pulmonary Artery: The pulmonary artery is of normal size. Venous: The inferior vena cava is normal in size with greater than 50% respiratory variability, suggesting right atrial pressure of 3 mmHg. IAS/Shunts: The interatrial septum was not well visualized.  LEFT VENTRICLE PLAX 2D LVIDd:         3.81 cm  Diastology LVIDs:         2.83 cm  LV e' medial:    12.20 cm/s LV PW:         1.40 cm  LV E/e' medial:  7.1 LV IVS:        1.38 cm  LV e' lateral:   2.94 cm/s LVOT diam:     2.00 cm  LV E/e' lateral:  29.4 LV SV:         34 LV SV Index:   16 LVOT Area:     3.14 cm  RIGHT VENTRICLE RV Basal diam:  4.88 cm RV S prime:     10.40 cm/s TAPSE (M-mode): 2.2 cm LEFT ATRIUM           Index       RIGHT ATRIUM           Index LA diam:      3.70 cm 1.78 cm/m  RA Area:     11.60 cm 5.22 cm/m LA Vol (A4C): 77.5 ml 37.27 ml/m RA Volume:   25.40 ml  12.22 ml/m  AORTIC VALVE                    PULMONIC VALVE AV Area (Vmax):    0.57 cm     PV Vmax:        0.87 m/s AV Area (Vmean):   0.55 cm     PV Peak grad:   3.1 mmHg AV Area (VTI):     0.54 cm     RVOT Peak grad: 3 mmHg AV Vmax:           335.67 cm/s AV Vmean:          253.000 cm/s AV VTI:            0.626 m AV Peak Grad:      45.1 mmHg AV Mean Grad:      32.0 mmHg LVOT Vmax:         61.40 cm/s LVOT Vmean:        44.100 cm/s LVOT VTI:          0.107 m LVOT/AV VTI ratio: 0.17  AORTA Ao Root diam: 3.60 cm MITRAL VALVE                TRICUSPID VALVE MV Area (PHT): 4.26 cm     TR Peak grad:   51.3 mmHg MV Area VTI:   1.23 cm     TR Vmax:        358.00 cm/s MV VTI:        0.27 m MV Decel Time: 178 msec     SHUNTS MV E velocity: 86.50 cm/s   Systemic VTI:  0.11 m MV A velocity: 143.00 cm/s  Systemic Diam: 2.00 cm MV E/A ratio:  0.60 Christopher End MD Electronically signed by  Yvonne Kendall MD Signature Date/Time: 01/29/2021/2:46:32 PM    Final      Assessment/Plan:  Chest pain- NSTEMI Seen by cardiology, on heparin  Severe aortic stenosis- high risk for TAVR currently due to many comorbidities  Abscess left hip due to MRSA- s/p debridement- wound vac  with possible  underlying infection of the bone.  On vanco and rifampin  H/O left femur fracture s/p ORIF on 10/03/20   Abdominal aortic aneurysm- Juxta renal 8.2 cm- s/p endovascular repair  Acute on chronic hypoxic respiratory failure On hi flo  Discussed the management with patient and his grandson

## 2021-01-29 NOTE — TOC Transition Note (Signed)
Transition of Care [facility]) - CM/SW Discharge Note   Patient Details  Name: Monica [name] MRN: [id number] Date of Birth: [birth year]  Transition of Care [facility]) CM/SW Contact:  Chapman Fitch, RN Phone Number: 01/29/2021, 4:24 PM   Clinical Narrative:      New chest pain Still on HFNC, not medically cleared to start auth for SNF   Barriers to Discharge: Continued Medical Work up   Patient Goals and CMS Choice   CMS Medicare.gov Compare Post Acute Care list provided to:: Patient (Granddaughter at bedside)    Discharge Placement                       Discharge Plan and Services     Post Acute Care Choice: Skilled Nursing Facility                               Social Determinants of Health (SDOH) Interventions     Readmission Risk Interventions No flowsheet data found.

## 2021-01-29 NOTE — Discharge Instructions (Signed)
Vascular Surgery Discharge Instructions:  1) please do not lift greater than 10 pounds until you are cleared at your first postoperative visit. 2) please keep your groins clean and dry.

## 2021-01-29 NOTE — Progress Notes (Signed)
PROGRESS NOTE    Monica [name]  XKG:[id number] DOB: 1934/02/19 DOA: 01/15/2021 PCP: Patient, No Pcp Per (Inactive)   Brief Narrative:  Monica [name] is a 87 y.o. F with CAD s/p PCI x1 in 2018, severe AS being worked up for TAVR, Obesity, pHTN, and COPD who presented with hip pain.   Had hip fracture after fall in Feb, repaired and discharged home.  Since being home 4 months ago, really never able to walk due to pain. Finally, in the last week before admission, developed redness and worse pain at left hip surgical site, then wound dehisced.   Presented to the ER and Orthopedics were consulted for suspected post-surgical hip infection with MRSA.  ID is recommending 4 weeks of vancomycin and rifampin.    Also noted incidentally to have very large AAA, she was taken to the OR for endovascular repair with vascular surgery today.  Also had severe aortic stenosis and plan is for TAVR work-up after AAA repair, most likely as an outpatient if able to get out of hospital from current admission.  She will not be a good candidate due to multiple comorbidities.  Patient with multiple life-threatening comorbidities-high risk for deterioration and death, palliative care was also consulted, patient is now on DNR/DNI.  She will consider comfort care if deteriorates. At this time we will continue current level of care.  Patient had her endovascular AAA repair done with vascular surgery on 01/24/2021, she remained intubated and in ICU for 2 days after that, extubated yesterday and we will resume care again from today.  Remained on heated high flow, weak cough and appears very lethargic.  6/18: Appears more lethargic with worsening oxygen requirement, today at 50 L and 65% FiO2  6/19: Little improvement in FiO2, started taking few sips here and there.  6/20: Patient remains on heated high flow at 55 L with 50% FiO2.  Developed chest pain which he responded to nitroglycerin.  Troponin elevated at 1374, EKG with  nonspecific T wave changes.  Concern of NSTEMI.  Cardiology was reconsulted, they are recommending conservative management.  She was started on heparin infusion after consulting vascular surgery as she recently had her endovascular AAA repair.  Palliative care again met her but she wants to keep pushing, prognosis is very guarded due to multiple life limiting comorbidities.  Very high risk for deterioration and death.  Subjective: Patient was complaining of substernal chest pain, feels like pressure, nonradiating, no nausea or vomiting but appears very anxious when seen today.  She was also complaining of lower abdominal pain which is going on since her procedure.   Assessment & Plan:   Active Problems:   Abscess of left hip   Abdominal aortic aneurysm (AAA) without rupture (HCC)   Obesity, Class III, BMI 40-49.9 (morbid obesity) ([place])   Pre-op evaluation   Shortness of breath   Hypoxia  Chest pain/NSTEMI.  Concern of NSTEMI with markedly elevated troponin.  Chest pain responded to nitroglycerin.  History of CAD with PCI to RCA. Cardiology was reconsulted and they are recommending conservative management. Cardiology started her on heparin infusion after consulting vascular surgery. She was also started on aspirin and Plavix. Echocardiogram was ordered to reevaluate.  Postsurgical hip infection S/p debridement of the left hip wound by Dr. Posey [name], 6/7 Admitted and taken to OR 6/7 for washout.  Intraoperative cultures growing MRSA in 3/3. ID consulted, recommended treating with 4 weeks antibiotics for presumed MRSA prosthetic infection.  1/2 blood cultures positive for staph epidermidis  which was thought to be a contaminant. 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  Patient was evaluated by vascular surgery, who recommended urgent repair given the size.  She is not a candidate for open repair.  She was taken to  the OR for endovascular repair-tolerated the procedure well, stable from vascular standpoint and will follow-up as an outpatient. -Appreciate vascular help.  Lower abdominal pain.  Operative sites looks okay.  Patient did not had any bowel movement for many days. -Continue with bowel regimen -Continue with pain management  Severe aortic stenosis Previously known, since hip fracture in Feb.  Was pending TAVR eval prior to this admission, seen by Cardiology here.  -No plan to start TAVR work-up while she is in the hospital due to other comorbidities and life-threatening conditions.  Most likely will be done as an outpatient.    Pulmonary hypertension due to AS, CHF   Hypotension due to aortic stenosis -Continue midodrine   Chronic diastolic CHF -Defer diuretic to Cardiology-Lasix was held initially for the past couple of days, worsening hypoxia with basal crackles.  Acute on chronic hypoxic respiratory failure.  Mild improvement in FiO2 requirement today.  Otherwise remained on heated high flow at 45 L and 50% FiO2. Has chronic hypoxic respiratory failure due to aortic stenosis, heart failure, and pulmonary hypertension.  COPD is in chart but appears to be mild. Former smoker.  Uses 2 L of oxygen at baseline.  Initially requiring 2 to 3 L and then got worse requiring up to 10 L starting from 6/12. Chest x-ray unremarkable, CT angiogram of the chest with patchy subpleural fibrosis, no PE, moderate atelectasis, no edema or large consolidation, no effusion. Patient required prolonged intubation after the procedure, extubated yesterday but continued to require very high levels of oxygen. - Consult to pulmonogy-appreciate their help - Continue Solumedrol BID for NSIP    Coronary artery disease, secondary prevention - Continue Crestor   Iron deficiency anemia Hgb stable Given Feraheme 6/10 -Transfusion threshold 8 g/dL  Hyponatremia.  Resolved Mild, asymptomatic  Vaginal  candidiasis Given Fluconazole 1 dose on 6/10.   Left renal nodule Incidental finding, minor, given gravity of AAA and AS and hypoxia, this likely has not been discussed with patient and should be followed up only depending on goals of care after AAA repair - Follow-up MRI as an outpatient  Class II obesity. Estimated body mass index is 37.42 kg/m as calculated from the following:   Height as of this encounter: 5' 5"  (1.651 m).   Weight as of this encounter: 102 kg.   Goals of care.  Patient with multiple life limiting comorbidities, very high risk for deterioration and death.  Palliative care is involved.  Currently we will continue current level of care but if she deteriorates they will consider comfort care.  She is now DNR/DNI She wants to keep pushing herself at this time.  Objective: Vitals:   01/28/21 2100 01/29/21 0000 01/29/21 0400 01/29/21 0700  BP: 134/80 129/81 124/77   Pulse: (!) 110 (!) 109    Resp: (!) 23 (!) 31 (!) 24   Temp: 97.7 F (36.5 C) (!) 97.5 F (36.4 C) 98.4 F (36.9 C)   TempSrc:  Axillary Axillary   SpO2: 94% 94% 94% 93%  Weight:      Height:        Intake/Output Summary (Last 24 hours) at 01/29/2021 0820 Last data filed at  01/29/2021 0725 Gross per 24 hour  Intake 710.32 ml  Output 2200 ml  Net -1489.68 ml    Filed Weights   01/15/21 1638 01/24/21 1500 01/28/21 1635  Weight: 106.6 kg 102 kg 102 kg    Examination:  General.  Chronically ill-appearing elderly lady, appears little anxious. Pulmonary.  Bilateral basal crackles, normal respiratory effort. CV.  Mild tachycardia with systolic murmur Abdomen.  Soft, nontender, nondistended, BS positive. CNS.  Alert and oriented .  No focal neurologic deficit. Extremities.  Trace LE edema, no cyanosis, pulses intact and symmetrical. Psychiatry.  Judgment and insight appears normal.   DVT prophylaxis: Lovenox Code Status: DNR Family Communication: Son was updated at bedside. Disposition Plan:   Status is: Inpatient  Remains inpatient appropriate because:Inpatient level of care appropriate due to severity of illness  Dispo: The patient is from:  Home              Anticipated d/c is to:  To be determined              Patient currently is not medically stable to d/c.   Difficult to place patient No                Level of care: Progressive Cardiac  All the records are reviewed and case discussed with Care Management/Social Worker. Management plans discussed with the patient, nursing and they are in agreement.  Consultants:  Vascular surgery Pulmonology Cardiology  Procedures:  Antimicrobials:  Vancomycin Rifampin  Data Reviewed: I have personally reviewed following labs and imaging studies  CBC: Recent Labs  Lab 01/25/21 0534 01/26/21 0917 01/27/21 0528 01/28/21 0500 01/29/21 0455  WBC 7.4 8.7 8.9 8.8 9.5  HGB 11.6* 11.8* 11.3* 11.0* 11.6*  HCT 37.7 37.8 36.4 34.8* 36.4  MCV 77.1* 76.4* 76.5* 77.0* 74.7*  PLT 180 165 170 175 022    Basic Metabolic Panel: Recent Labs  Lab 01/25/21 0534 01/26/21 0917 01/27/21 0528 01/28/21 0500 01/29/21 0455  NA 135 136 130* 135 130*  K 4.6 3.9 3.9 3.3* 3.7  CL 95* 96* 93* 98 93*  CO2 31 29 28 29 28   GLUCOSE 121* 130* 117* 122* 132*  BUN 29* 37* 29* 28* 26*  CREATININE 0.93  1.02* 1.16* 0.92 0.98 0.91  CALCIUM 8.5* 8.5* 8.2* 8.3* 8.3*  MG  --   --  2.4 2.2 2.0  PHOS  --   --  2.8 2.3* 2.5    GFR: Estimated Creatinine Clearance: 53.5 mL/min (by C-G formula based on SCr of 0.91 mg/dL). Liver Function Tests: Recent Labs  Lab 01/23/21 0426  AST 26  ALT 11  ALKPHOS 60  BILITOT 1.9*  PROT 7.6  ALBUMIN 3.0*    No results for input(s): LIPASE, AMYLASE in the last 168 hours. No results for input(s): AMMONIA in the last 168 hours. Coagulation Profile: No results for input(s): INR, PROTIME in the last 168 hours. Cardiac Enzymes: No results for input(s): CKTOTAL, CKMB, CKMBINDEX, TROPONINI in the last 168  hours. BNP (last 3 results) No results for input(s): PROBNP in the last 8760 hours. HbA1C: No results for input(s): HGBA1C in the last 72 hours. CBG: Recent Labs  Lab 01/24/21 1515  GLUCAP 141*    Lipid Profile: No results for input(s): CHOL, HDL, LDLCALC, TRIG, CHOLHDL, LDLDIRECT in the last 72 hours. Thyroid Function Tests: No results for input(s): TSH, T4TOTAL, FREET4, T3FREE, THYROIDAB in the last 72 hours. Anemia Panel: No results for input(s): VITAMINB12, FOLATE, FERRITIN, TIBC, IRON,  RETICCTPCT in the last 72 hours. Sepsis Labs: No results for input(s): PROCALCITON, LATICACIDVEN in the last 168 hours.  No results found for this or any previous visit (from the past 240 hour(s)).    Radiology Studies: No results found.  Scheduled Meds:  Chlorhexidine Gluconate Cloth  6 each Topical Daily   diltiazem  120 mg Oral Daily   docusate  100 mg Per Tube BID   enoxaparin (LOVENOX) injection  40 mg Subcutaneous Q24H   furosemide  40 mg Oral Daily   ipratropium-albuterol  3 mL Nebulization Q6H   methylPREDNISolone (SOLU-MEDROL) injection  40 mg Intravenous Daily   multivitamin with minerals  1 tablet Oral Daily   mupirocin ointment  1 application Nasal BID   pantoprazole (PROTONIX) IV  40 mg Intravenous Daily   pneumococcal 23 valent vaccine  0.5 mL Intramuscular Tomorrow-1000   polyethylene glycol  17 g Oral Daily   rifampin  300 mg Oral BID WC   Continuous Infusions:  sodium chloride Stopped (01/28/21 1014)   promethazine (PHENERGAN) injection (IM or IVPB) Stopped (01/26/21 1902)   vancomycin       LOS: 14 days   Time spent: 45 minutes More than 50% of the time was spent in counseling/coordination of care  Lorella Nimrod, MD [facility]  If 7PM-7AM, please contact night-coverage [email or web address]  01/29/2021, 8:20 AM   This record has been created using Systems analyst. Errors have been sought and corrected,but may not always be located. Such  creation errors do not reflect on the standard of care.

## 2021-01-29 NOTE — Progress Notes (Signed)
*  PRELIMINARY RESULTS* Echocardiogram 2D Echocardiogram has been performed.  Cristela Blue 01/29/2021, 1:46 PM

## 2021-01-30 DIAGNOSIS — I214 Non-ST elevation (NSTEMI) myocardial infarction: Secondary | ICD-10-CM | POA: Diagnosis not present

## 2021-01-30 DIAGNOSIS — L02416 Cutaneous abscess of left lower limb: Secondary | ICD-10-CM | POA: Diagnosis not present

## 2021-01-30 DIAGNOSIS — Z7189 Other specified counseling: Secondary | ICD-10-CM | POA: Diagnosis not present

## 2021-01-30 DIAGNOSIS — I714 Abdominal aortic aneurysm, without rupture: Secondary | ICD-10-CM | POA: Diagnosis not present

## 2021-01-30 DIAGNOSIS — R079 Chest pain, unspecified: Secondary | ICD-10-CM

## 2021-01-30 DIAGNOSIS — I350 Nonrheumatic aortic (valve) stenosis: Secondary | ICD-10-CM | POA: Diagnosis not present

## 2021-01-30 LAB — CBC
HCT: 33.1 % — ABNORMAL LOW (ref 36.0–46.0)
Hemoglobin: 10.7 g/dL — ABNORMAL LOW (ref 12.0–15.0)
MCH: 24.5 pg — ABNORMAL LOW (ref 26.0–34.0)
MCHC: 32.3 g/dL (ref 30.0–36.0)
MCV: 75.7 fL — ABNORMAL LOW (ref 80.0–100.0)
Platelets: 243 10*3/uL (ref 150–400)
RBC: 4.37 MIL/uL (ref 3.87–5.11)
RDW: 20.6 % — ABNORMAL HIGH (ref 11.5–15.5)
WBC: 7.9 10*3/uL (ref 4.0–10.5)
nRBC: 0 % (ref 0.0–0.2)

## 2021-01-30 LAB — HEPATIC FUNCTION PANEL
ALT: 20 U/L (ref 0–44)
AST: 30 U/L (ref 15–41)
Albumin: 2.6 g/dL — ABNORMAL LOW (ref 3.5–5.0)
Alkaline Phosphatase: 54 U/L (ref 38–126)
Bilirubin, Direct: 1 mg/dL — ABNORMAL HIGH (ref 0.0–0.2)
Indirect Bilirubin: 1.4 mg/dL — ABNORMAL HIGH (ref 0.3–0.9)
Total Bilirubin: 2.4 mg/dL — ABNORMAL HIGH (ref 0.3–1.2)
Total Protein: 6.4 g/dL — ABNORMAL LOW (ref 6.5–8.1)

## 2021-01-30 LAB — HEPARIN LEVEL (UNFRACTIONATED)
Heparin Unfractionated: 0.15 IU/mL — ABNORMAL LOW (ref 0.30–0.70)
Heparin Unfractionated: 0.34 IU/mL (ref 0.30–0.70)
Heparin Unfractionated: 0.28 IU/mL — ABNORMAL LOW (ref 0.30–0.70)

## 2021-01-30 MED ORDER — MELATONIN 5 MG PO TABS
5.0000 mg | ORAL_TABLET | Freq: Every day | ORAL | Status: DC
  Administered 2021-01-30 – 2021-02-09 (×10): 5 mg via ORAL
  Filled 2021-01-30 (×11): qty 1

## 2021-01-30 MED ORDER — HEPARIN BOLUS VIA INFUSION
2400.0000 [IU] | Freq: Once | INTRAVENOUS | Status: AC
  Administered 2021-01-30: 2400 [IU] via INTRAVENOUS
  Filled 2021-01-30: qty 2400

## 2021-01-30 MED ORDER — HEPARIN BOLUS VIA INFUSION
1200.0000 [IU] | Freq: Once | INTRAVENOUS | Status: AC
  Administered 2021-01-31: 1200 [IU] via INTRAVENOUS
  Filled 2021-01-30: qty 1200

## 2021-01-30 MED ORDER — RANOLAZINE ER 500 MG PO TB12
500.0000 mg | ORAL_TABLET | Freq: Two times a day (BID) | ORAL | Status: DC
  Administered 2021-01-30 – 2021-02-10 (×22): 500 mg via ORAL
  Filled 2021-01-30 (×24): qty 1

## 2021-01-30 MED ORDER — ENSURE ENLIVE PO LIQD
237.0000 mL | Freq: Three times a day (TID) | ORAL | Status: DC
  Administered 2021-01-30 – 2021-02-10 (×25): 237 mL via ORAL

## 2021-01-30 MED ORDER — LACTULOSE 10 GM/15ML PO SOLN
10.0000 g | Freq: Every day | ORAL | Status: DC | PRN
  Administered 2021-01-30 – 2021-02-06 (×3): 10 g via ORAL
  Filled 2021-01-30 (×3): qty 30

## 2021-01-30 NOTE — Progress Notes (Signed)
Date of Admission:  01/15/2021      ID: Monica [name] is a 87 y.o. female  Active Problems:   Abscess of left hip   Abdominal aortic aneurysm (AAA) without rupture (HCC)   Obesity, Class III, BMI 40-49.9 (morbid obesity) (HCC)   Pre-op evaluation   Shortness of breath   Hypoxia   Non-ST elevation (NSTEMI) myocardial infarction [facility](HCC)    Subjective: Doing better No chest pain Breathing better   Medications:   aspirin  81 mg Oral Daily   Chlorhexidine Gluconate Cloth  6 each Topical Daily   clopidogrel  75 mg Oral Daily   docusate  100 mg Per Tube BID   feeding supplement  237 mL Oral TID BM   furosemide  40 mg Oral Daily   ipratropium-albuterol  3 mL Nebulization Q6H   methylPREDNISolone (SOLU-MEDROL) injection  40 mg Intravenous Daily   metoprolol tartrate  25 mg Oral BID   multivitamin with minerals  1 tablet Oral Daily   pantoprazole (PROTONIX) IV  40 mg Intravenous Daily   pneumococcal 23 valent vaccine  0.5 mL Intramuscular Tomorrow-1000   polyethylene glycol  17 g Oral Daily   ranolazine  500 mg Oral BID   rifampin  300 mg Oral BID WC   rosuvastatin  40 mg Oral Daily   sodium chloride flush  10-40 mL Intracatheter Q12H    Objective: Vital signs in last 24 hours: Temp:  [97.7 F (36.5 C)-98.2 F (36.8 C)] 98.1 F (36.7 C) (06/21 1038) Pulse Rate:  [90-97] 90 (06/21 1038) Resp:  [21-29] 21 (06/21 1038) BP: (107-124)/(59-82) 108/67 (06/21 1038) SpO2:  [92 %-96 %] 95 % (06/21 1449) FiO2 (%):  [50 %] 50 % (06/21 0400) Weight:  [98.5 kg] 98.5 kg (06/21 0500)  PHYSICAL EXAM:  General: Alert, cooperative,  Head: Normocephalic, without obvious abnormality, atraumatic. Eyes: Conjunctivae clear, anicteric sclerae. Pupils are equal ENT Nares normal. No drainage or sinus tenderness. Lips, mucosa, and tongue normal. No Thrush Neck: Supple, symmetrical, no adenopathy, thyroid: non tender no carotid bruit and no JVD. Lungs: b/l air entry-   Heart:irregular Abdomen: Soft, non-tender,not distended. Bowel sounds normal. No masses Extremities: left hip- surgical site wound vac removed- Skin: No rashes or lesions. Or bruising Lymph: Cervical, supraclavicular normal. Neurologic: Grossly non-focal  Lab Results Recent Labs    01/28/21 0500 01/29/21 0455 01/30/21 0509  WBC 8.8 9.5 7.9  HGB 11.0* 11.6* 10.7*  HCT 34.8* 36.4 33.1*  NA 135 130*  --   K 3.3* 3.7  --   CL 98 93*  --   CO2 29 28  --   BUN 28* 26*  --   CREATININE 0.98 0.91  --    Liver Panel Recent Labs    01/30/21 0509  PROT 6.4*  ALBUMIN 2.6*  AST 30  ALT 20  ALKPHOS 54  BILITOT 2.4*  BILIDIR 1.0*  IBILI 1.4*   Sedimentation Rate No results for input(s): ESRSEDRATE in the last 72 hours. C-Reactive Protein No results for input(s): CRP in the last 72 hours.  Microbiology:  Studies/Results: ECHOCARDIOGRAM COMPLETE  Result Date: 01/29/2021    ECHOCARDIOGRAM REPORT   Patient Name:   Monica [name] Date of Exam: 01/29/2021 Medical Rec #:  [id number]      Height:       65.0 in Accession #:    [id number]     Weight:       224.9 lb Date of Birth:  Aug 23, 1933  BSA:          2.079 m Patient Age:    87 years       BP:           104/75 mmHg Patient Gender: F              HR:           110 bpm. Exam Location:  [facility] Procedure: Color Doppler and Cardiac Doppler Indications:     Chest pain R07.9  History:         Patient has prior history of Echocardiogram examinations, most                  recent 10/03/2020. CAD. Aortic valve stenosis.  Sonographer:     Irish Lack RT Referring Phys:  [id number] [facility] Diagnosing Phys: Yvonne Kendall MD IMPRESSIONS  1. Left ventricular ejection fraction, by estimation, is 30 to 35%. The left ventricle has moderately decreased function. The left ventricle demonstrates regional wall motion abnormalities (see scoring diagram/findings for description). There is moderate left ventricular hypertrophy. Left ventricular  diastolic parameters are indeterminate. There is severe hypokinesis of the left ventricular, mid-apical anteroseptal wall, anterior wall, anterolateral wall and apical segment.  2. Right ventricular systolic function is normal. The right ventricular size is mildly enlarged. There is moderately elevated pulmonary artery systolic pressure.  3. Left atrial size was mildly dilated.  4. The mitral valve is degenerative. Mild mitral valve regurgitation. Mild mitral stenosis. Moderate mitral annular calcification.  5. The aortic valve is tricuspid. There is moderate calcification of the aortic valve. There is severe thickening of the aortic valve. Aortic valve regurgitation is trivial. Severe aortic valve stenosis. Aortic valve area, by VTI measures 0.54 cm. Aortic valve mean gradient measures 32.0 mmHg.  6. The inferior vena cava is normal in size with greater than 50% respiratory variability, suggesting right atrial pressure of 3 mmHg. FINDINGS  Left Ventricle: Left ventricular ejection fraction, by estimation, is 30 to 35%. The left ventricle has moderately decreased function. The left ventricle demonstrates regional wall motion abnormalities. Severe hypokinesis of the left ventricular, mid-apical anteroseptal wall, anterior wall, anterolateral wall and apical segment. The left ventricular internal cavity size was normal in size. There is moderate left ventricular hypertrophy. Left ventricular diastolic parameters are indeterminate. Right Ventricle: The right ventricular size is mildly enlarged. No increase in right ventricular wall thickness. Right ventricular systolic function is normal. There is moderately elevated pulmonary artery systolic pressure. The tricuspid regurgitant velocity is 3.58 m/s, and with an assumed right atrial pressure of 3 mmHg, the estimated right ventricular systolic pressure is 54.3 mmHg. Left Atrium: Left atrial size was mildly dilated. Right Atrium: Right atrial size was normal in size.  Pericardium: There is no evidence of pericardial effusion. Mitral Valve: The mitral valve is degenerative in appearance. There is mild thickening of the mitral valve leaflet(s). Moderate mitral annular calcification. Mild mitral valve regurgitation. Mild mitral valve stenosis. Tricuspid Valve: The tricuspid valve is grossly normal. Tricuspid valve regurgitation is trivial. Aortic Valve: The aortic valve is tricuspid. There is moderate calcification of the aortic valve. There is severe thickening of the aortic valve. Aortic valve regurgitation is trivial. Severe aortic stenosis is present. Aortic valve mean gradient measures 32.0 mmHg. Aortic valve peak gradient measures 45.1 mmHg. Aortic valve area, by VTI measures 0.54 cm. Pulmonic Valve: The pulmonic valve was grossly normal. Pulmonic valve regurgitation is not visualized. No evidence of pulmonic stenosis. Aorta: The aortic root  is normal in size and structure. Pulmonary Artery: The pulmonary artery is of normal size. Venous: The inferior vena cava is normal in size with greater than 50% respiratory variability, suggesting right atrial pressure of 3 mmHg. IAS/Shunts: The interatrial septum was not well visualized.  LEFT VENTRICLE PLAX 2D LVIDd:         3.81 cm  Diastology LVIDs:         2.83 cm  LV e' medial:    12.20 cm/s LV PW:         1.40 cm  LV E/e' medial:  7.1 LV IVS:        1.38 cm  LV e' lateral:   2.94 cm/s LVOT diam:     2.00 cm  LV E/e' lateral: 29.4 LV SV:         34 LV SV Index:   16 LVOT Area:     3.14 cm  RIGHT VENTRICLE RV Basal diam:  4.88 cm RV S prime:     10.40 cm/s TAPSE (M-mode): 2.2 cm LEFT ATRIUM           Index       RIGHT ATRIUM           Index LA diam:      3.70 cm 1.78 cm/m  RA Area:     11.60 cm 5.22 cm/m LA Vol (A4C): 77.5 ml 37.27 ml/m RA Volume:   25.40 ml  12.22 ml/m  AORTIC VALVE                    PULMONIC VALVE AV Area (Vmax):    0.57 cm     PV Vmax:        0.87 m/s AV Area (Vmean):   0.55 cm     PV Peak grad:   3.1  mmHg AV Area (VTI):     0.54 cm     RVOT Peak grad: 3 mmHg AV Vmax:           335.67 cm/s AV Vmean:          253.000 cm/s AV VTI:            0.626 m AV Peak Grad:      45.1 mmHg AV Mean Grad:      32.0 mmHg LVOT Vmax:         61.40 cm/s LVOT Vmean:        44.100 cm/s LVOT VTI:          0.107 m LVOT/AV VTI ratio: 0.17  AORTA Ao Root diam: 3.60 cm MITRAL VALVE                TRICUSPID VALVE MV Area (PHT): 4.26 cm     TR Peak grad:   51.3 mmHg MV Area VTI:   1.23 cm     TR Vmax:        358.00 cm/s MV VTI:        0.27 m MV Decel Time: 178 msec     SHUNTS MV E velocity: 86.50 cm/s   Systemic VTI:  0.11 m MV A velocity: 143.00 cm/s  Systemic Diam: 2.00 cm MV E/A ratio:  0.60 Cristal Deer End MD Electronically signed by Yvonne Kendall MD Signature Date/Time: 01/29/2021/2:46:32 PM    Final      Assessment/Plan:  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 On vanco and rifampin- duration depends  on her progression-   Severe aortic stenosis- high risk for TAVR currently due to many comorbidities   Chest pain- NSTEMI Seen by cardiology   H/O left femur fracture s/p ORIF on 10/03/20     Abdominal aortic aneurysm- Juxta renal 8.2 cm- s/p endovascular repair   Acute on chronic hypoxic respiratory failure On [place]- 10 L oxygen   Discussed the management with patient and his grandson

## 2021-01-30 NOTE — Consult Note (Signed)
ANTICOAGULATION CONSULT NOTE   Pharmacy Consult for Heparin Indication: chest pain/ACS  No Known Allergies  Patient Measurements: Height: 5\' 5"  (165.1 cm) Weight: 98.5 kg (217 lb 2.5 oz) IBW/kg (Calculated) : 57 Heparin Dosing Weight: 80.5 kg   Vital Signs: Temp: 97.9 F (36.6 C) (06/21 0400) Temp Source: Oral (06/21 0400) BP: 114/65 (06/21 0500) Pulse Rate: 94 (06/21 0500)  Labs: Recent Labs    01/28/21 0500 01/29/21 0455 01/29/21 1054 01/29/21 1241 01/29/21 1336 01/29/21 2120 01/30/21 0509  HGB 11.0* 11.6*  --   --   --   --  10.7*  HCT 34.8* 36.4  --   --   --   --  33.1*  PLT 175 212  --   --   --   --  243  APTT  --   --   --   --  37*  --   --   LABPROT  --   --   --   --  17.4*  --   --   INR  --   --   --   --  1.4*  --   --   HEPARINUNFRC  --   --   --   --   --  0.31 0.15*  CREATININE 0.98 0.91  --   --   --   --   --   TROPONINIHS  --   --  1,374* 1,349*  --   --   --      Estimated Creatinine Clearance: 52.5 mL/min (by C-G formula based on SCr of 0.91 mg/dL).   Medical History: Past Medical History:  Diagnosis Date   Aortic stenosis    a. LHC 06/02/17: At least moderate aortic stenosis with a peak to peak gradient of 22 mmHg; b. TTE 05/2017: EF 55-60%, mild HK basal-midinferior wall, Gr1DD, mod to sev AS w/ mean gradient 21 mmHg, valve area 0.99, mild MR, mildly dilated LA    CAD (coronary artery disease) 2018   a. inferior STEMI 06/02/2017: LHC 06/02/17: LM 20, D1 20%, o-pLCx 50, p-mRCA 100% s/p PCI/DES, mRCA 50, dRCA 30   GI bleed    a. noted 06/05/2017   HLD (hyperlipidemia)    Obesity     Medications:  Medications Prior to Admission  Medication Sig Dispense Refill Last Dose   aspirin 81 MG EC tablet Take 81 mg by mouth daily.   Past Week   carvedilol (COREG) 3.125 MG tablet Take 3.125 mg by mouth 2 (two) times daily with a meal.   Past Week at unknown   oxyCODONE (OXY IR/ROXICODONE) 5 MG immediate release tablet Take 5 mg by mouth every 4  (four) hours as needed for severe pain.   prn at prn   rosuvastatin (CRESTOR) 10 MG tablet Take 1 tablet by mouth once daily (Patient taking differently: Take 10 mg by mouth daily.) 90 tablet 0 Past Week   [DISCONTINUED] enoxaparin (LOVENOX) 40 MG/0.4ML injection Inject 0.4 mLs (40 mg total) into the skin daily for 14 days. (Patient not taking: Reported on 01/22/2021) 5.6 mL 0 Not Taking   Scheduled:   aspirin  81 mg Oral Daily   Chlorhexidine Gluconate Cloth  6 each Topical Daily   clopidogrel  75 mg Oral Daily   docusate  100 mg Per Tube BID   furosemide  40 mg Oral Daily   heparin  2,400 Units Intravenous Once   ipratropium-albuterol  3 mL Nebulization Q6H   methylPREDNISolone (SOLU-MEDROL) injection  40 mg Intravenous Daily   metoprolol tartrate  25 mg Oral BID   multivitamin with minerals  1 tablet Oral Daily   pantoprazole (PROTONIX) IV  40 mg Intravenous Daily   pneumococcal 23 valent vaccine  0.5 mL Intramuscular Tomorrow-1000   polyethylene glycol  17 g Oral Daily   rifampin  300 mg Oral BID WC   rosuvastatin  40 mg Oral Daily   sodium chloride flush  10-40 mL Intracatheter Q12H   Infusions:   sodium chloride 250 mL (01/29/21 1103)   heparin 1,000 Units/hr (01/29/21 1325)   promethazine (PHENERGAN) injection (IM or IVPB) Stopped (01/26/21 1902)   vancomycin 1,000 mg (01/29/21 1105)   PRN: bisacodyl, diazepam, ipratropium-albuterol, lip balm, [DISCONTINUED] metoCLOPramide **OR** metoCLOPramide (REGLAN) injection, morphine injection, nitroGLYCERIN, [DISCONTINUED] promethazine **OR** promethazine (PHENERGAN) injection (IM or IVPB) **OR** promethazine, sodium chloride flush  Assessment: Pharmacy consulted to start heparin for ACS. No DOAC PTA. Trop 1374. Pt received enoxaparin 40 mg 6/20 0839. Heparin level may be a little high due to enoxaparin.    Goal of Therapy:  Heparin level 0.3-0.7 units/ml Monitor platelets by anticoagulation protocol: Yes   Plan:  6/21:  HL @ 0509 =  0.15 Will order Heparin 2400 units IV X 1 and increase drip rate to 1300 units/hr.  Will recheck HL 8 hrs after rate change.   Meklit Cotta D, PharmD 01/30/2021,6:12 AM

## 2021-01-30 NOTE — Progress Notes (Signed)
SLP Cancellation Note  Patient Details Name: Monica [name] MRN: [id number] DOB: [birth year]   Cancelled treatment:       Reason Eval/Treat Not Completed: Fatigue/lethargy limiting ability to participate;Medical issues which prohibited therapy  Unfortunately, pt continues to be very ill. Chart review reveals NSTEMI yesterday, continues on HFNC and is very fatigued. Pt politely refused any PO with me today. Per chart she is taking a few sips throughout her day.   At this time, pt is not a candidate for skilled intervention. Please reconsult when pt is appropriate.   Jiayi Lengacher B. Dreama Saa M.S., CCC-SLP, [facility] Speech-Language Pathologist Rehabilitation Services Office [phone]    Reuel Derby 01/30/2021, 9:23 AM

## 2021-01-30 NOTE — Progress Notes (Signed)
Subjective: 14 Days s/p L hip I and D for abscess. NO pain swelling warmth redness or drainage. Provena empty. Patient reports left hip pain as 0/10 Patient is well, and has had no acute complaints or problems  Objective: Vital signs in last 24 hours: Temp:  [97.7 F (36.5 C)-98.2 F (36.8 C)] 98.1 F (36.7 C) (06/21 1038) Pulse Rate:  [90-108] 90 (06/21 1038) Resp:  [21-29] 21 (06/21 1038) BP: (99-124)/(59-82) 108/67 (06/21 1038) SpO2:  [92 %-99 %] 93 % (06/21 1038) FiO2 (%):  [50 %] 50 % (06/21 0400) Weight:  [98.5 kg] 98.5 kg (06/21 0500)  Intake/Output from previous day:  Intake/Output Summary (Last 24 hours) at 01/30/2021 1230 Last data filed at 01/30/2021 1037 Gross per 24 hour  Intake 945.76 ml  Output 1475 ml  Net -529.24 ml    Intake/Output this shift: Total I/O In: 240 [P.O.:240] Out: 400 [Urine:400]  Labs: Recent Labs    01/28/21 0500 01/29/21 0455 01/30/21 0509  HGB 11.0* 11.6* 10.7*   Recent Labs    01/29/21 0455 01/30/21 0509  WBC 9.5 7.9  RBC 4.87 4.37  HCT 36.4 33.1*  PLT 212 243   Recent Labs    01/28/21 0500 01/29/21 0455  NA 135 130*  K 3.3* 3.7  CL 98 93*  CO2 29 28  BUN 28* 26*  CREATININE 0.98 0.91  GLUCOSE 122* 132*  CALCIUM 8.3* 8.3*   Recent Labs    01/29/21 1336  INR 1.4*     EXAM General - Patient is Alert, Appropriate and Oriented Extremity - Woundvac intact to the left leg without drainage. Provena removed. Sutures removed. Incision completely healed with no warmth redness swelling or induration. No tenderness to palpation along incision site. Band aid applied Motor Function - intact, moving foot and toes well on exam.  Negative homans to bilateral lower extremities. Patient is intact to light touch to bilateral legs.  Past Medical History:  Diagnosis Date   Aortic stenosis    a. LHC 06/02/17: At least moderate aortic stenosis with a peak to peak gradient of 22 mmHg; b. TTE 05/2017: EF 55-60%, mild HK  basal-midinferior wall, Gr1DD, mod to sev AS w/ mean gradient 21 mmHg, valve area 0.99, mild MR, mildly dilated LA    CAD (coronary artery disease) 2018   a. inferior STEMI 06/02/2017: LHC 06/02/17: LM 20, D1 20%, o-pLCx 50, p-mRCA 100% s/p PCI/DES, mRCA 50, dRCA 30   GI bleed    a. noted 06/05/2017   HLD (hyperlipidemia)    Obesity     Assessment/Plan: 14 days s/p Left hip I&D  Procedure(s) (LRB): ENDOVASCULAR REPAIR/STENT GRAFT (N/A) Active Problems:   Abscess of left hip   Abdominal aortic aneurysm (AAA) without rupture (HCC)   Obesity, Class III, BMI 40-49.9 (morbid obesity) (HCC)   Pre-op evaluation   Shortness of breath   Hypoxia   Non-ST elevation (NSTEMI) myocardial infarction (HCC)  Estimated body mass index is 36.14 kg/m as calculated from the following:   Height as of this encounter: 5\' 5"  (1.651 m).   Weight as of this encounter: 98.5 kg.  Sutures removed today. DC provena Incision healed with no signs of infection Follow up with [facility] in 4 weeks for recheck    T. PA-C [facility] Orthopaedic Surgery 01/30/2021, 12:30 PM

## 2021-01-30 NOTE — Progress Notes (Signed)
Occupational Therapy Treatment Patient Details Name: Monica [name] MRN: [id number] DOB: March 06, 1934 Today's Date: 01/30/2021    History of present illness Pt is an 87 y.o. female presenting to hospital 6/6 for L hip drainage.  Pt admitted with L hip severe cellulitis and likely abscess, L LE moderate cellulitis, and infrarenal aortic artery aneurysm (6.7 cm x6.1 cm with extensive mural thrombus).  S/p L hip wound I&D and excisional debridement of muscle and fascia 6/7. Addtionally, CT scan showed a large infrarenal aortic aneurysm & pt is now s/p endovascular repair of AAA on 01/24/21 requiring t/f to ICU And ramained intubated until 6/16. Now on BiPap versus HHFNC. PMH includes CAD, HLD, obesity, L hip IMN February 2022, aortic stenosis, CAD, GI bleed, acute respiratory failure with hypoxia, STEMI.   OT comments  Pt seen for OT tx this date. Pt endorses feeling "okay" and agreeable to OT session focused on bed mobility for toileting and skin care with RN assist. Pt participated in bed level toileting requiring MAX A for rolling and maintaining sidelying + VC for use of bed rail to improve pt's participation for DEP pericare after very loose partial BM & linens change. VC for hand placement on bed rail to support effort, VC for PLB to support breath recovery. Pt SpO2 remained between 88%-95%, improved with VC for PLB. Pt endorses significant fatigue afterwards. Pt progressing slowly towards goals, continues to benefit from skilled OT services. SNF remains appropriate at discharge.    Follow Up Recommendations  SNF    Equipment Recommendations  Other (comment) (defer to next venue)    Recommendations for Other Services      Precautions / Restrictions Precautions Precautions: Fall Precaution Comments: Wound vac L hip; monitor O2 sats Restrictions Weight Bearing Restrictions: Yes LLE Weight Bearing: Weight bearing as tolerated       Mobility Bed Mobility Overal bed mobility: Needs  Assistance Bed Mobility: Rolling Rolling: Max assist;Total assist         General bed mobility comments: MAX A initially, fatigues with efforts, MAX A to maintain sidelying    Transfers                      Balance                                           ADL either performed or assessed with clinical judgement   ADL Overall ADL's : Needs assistance/impaired                           Toilet Transfer Details (indicate cue type and reason): MAX A for rolling for bed level toileting and maintaining sidelying Toileting- Clothing Manipulation and Hygiene: Total assistance;+2 for physical assistance;Bed level               Vision       Perception     Praxis      Cognition Arousal/Alertness: Awake/alert Behavior During Therapy: Flat affect Overall Cognitive Status: Within Functional Limits for tasks assessed                                 General Comments: alert, follows commands well        Exercises Other Exercises Other Exercises: Pt participated in bed level toileting  with OT and RN. Required MAX A for rolling and maintaining sidelying for DEP pericare & linens change. VC for hand placement on bed rail to support effort, VC for PLB to support breath recovery. TOTAL A for pericare   Shoulder Instructions       General Comments      Pertinent Vitals/ Pain       Pain Assessment: Faces Faces Pain Scale: Hurts little more Pain Location: BLE with movement/rolling Pain Intervention(s): Limited activity within patient's tolerance;Monitored during session;Repositioned  Home Living                                          Prior Functioning/Environment              Frequency  Min 1X/week        Progress Toward Goals  OT Goals(current goals can now be found in the care plan section)  Progress towards OT goals: Progressing toward goals (slowly)  Acute Rehab OT Goals Patient  Stated Goal: to just get better OT Goal Formulation: With patient Time For Goal Achievement: 02/10/21 Potential to Achieve Goals: Fair  Plan Discharge plan remains appropriate;Frequency remains appropriate    Co-evaluation                 AM-PAC OT "6 Clicks" Daily Activity     Outcome Measure   Help from another person eating meals?: None Help from another person taking care of personal grooming?: A Little Help from another person toileting, which includes using toliet, bedpan, or urinal?: Total Help from another person bathing (including washing, rinsing, drying)?: A Lot Help from another person to put on and taking off regular upper body clothing?: A Lot Help from another person to put on and taking off regular lower body clothing?: Total 6 Click Score: 13    End of Session Equipment Utilized During Treatment: Gait belt;Oxygen  OT Visit Diagnosis: Unsteadiness on feet (R26.81);Muscle weakness (generalized) (M62.81);Pain Pain - Right/Left: Left Pain - part of body: Hip   Activity Tolerance Patient tolerated treatment well   Patient Left in bed;with call bell/phone within reach;with bed alarm set   Nurse Communication          Time: 9024-0973 OT Time Calculation (min): 24 min  Charges: OT General Charges $OT Visit: 1 Visit OT Treatments $Self Care/Home Management : 23-37 mins  Wynona Canes, MPH, MS, OTR/L ascom [phone] 01/30/21, 3:04 PM

## 2021-01-30 NOTE — Progress Notes (Signed)
PT Cancellation Note  Patient Details Name: Monica [name] MRN: [id number] DOB: 03-Nov-1933   Cancelled Treatment:    Reason Eval/Treat Not Completed: Other (comment) (patient unavailable). Spoke with patient at bedside. Patient had large watery bowel movement and waiting to be bathed per her report. PT will continue with attempts as patient available to participate.   Donna Bernard, PT, MPT  Ina Homes 01/30/2021, 4:14 PM

## 2021-01-30 NOTE — Consult Note (Signed)
ANTICOAGULATION CONSULT NOTE   Pharmacy Consult for Heparin Indication: chest pain/ACS  No Known Allergies  Patient Measurements: Height: 5\' 5"  (165.1 cm) Weight: 98.5 kg (217 lb 2.5 oz) IBW/kg (Calculated) : 57 Heparin Dosing Weight: 80.5 kg   Vital Signs: Temp: 98.1 F (36.7 C) (06/21 1038) Temp Source: Oral (06/21 1038) BP: 108/67 (06/21 1038) Pulse Rate: 90 (06/21 1038)  Labs: Recent Labs    01/28/21 0500 01/29/21 0455 01/29/21 1054 01/29/21 1241 01/29/21 1336 01/29/21 2120 01/30/21 0509 01/30/21 1410  HGB 11.0* 11.6*  --   --   --   --  10.7*  --   HCT 34.8* 36.4  --   --   --   --  33.1*  --   PLT 175 212  --   --   --   --  243  --   APTT  --   --   --   --  37*  --   --   --   LABPROT  --   --   --   --  17.4*  --   --   --   INR  --   --   --   --  1.4*  --   --   --   HEPARINUNFRC  --   --   --   --   --  0.31 0.15* 0.34  CREATININE 0.98 0.91  --   --   --   --   --   --   TROPONINIHS  --   --  1,374* 1,349*  --   --   --   --      Estimated Creatinine Clearance: 52.5 mL/min (by C-G formula based on SCr of 0.91 mg/dL).   Medical History: Past Medical History:  Diagnosis Date   Aortic stenosis    a. LHC 06/02/17: At least moderate aortic stenosis with a peak to peak gradient of 22 mmHg; b. TTE 05/2017: EF 55-60%, mild HK basal-midinferior wall, Gr1DD, mod to sev AS w/ mean gradient 21 mmHg, valve area 0.99, mild MR, mildly dilated LA    CAD (coronary artery disease) 2018   a. inferior STEMI 06/02/2017: LHC 06/02/17: LM 20, D1 20%, o-pLCx 50, p-mRCA 100% s/p PCI/DES, mRCA 50, dRCA 30   GI bleed    a. noted 06/05/2017   HLD (hyperlipidemia)    Obesity     Medications:  Medications Prior to Admission  Medication Sig Dispense Refill Last Dose   aspirin 81 MG EC tablet Take 81 mg by mouth daily.   Past Week   carvedilol (COREG) 3.125 MG tablet Take 3.125 mg by mouth 2 (two) times daily with a meal.   Past Week at unknown   oxyCODONE (OXY  IR/ROXICODONE) 5 MG immediate release tablet Take 5 mg by mouth every 4 (four) hours as needed for severe pain.   prn at prn   rosuvastatin (CRESTOR) 10 MG tablet Take 1 tablet by mouth once daily (Patient taking differently: Take 10 mg by mouth daily.) 90 tablet 0 Past Week   [DISCONTINUED] enoxaparin (LOVENOX) 40 MG/0.4ML injection Inject 0.4 mLs (40 mg total) into the skin daily for 14 days. (Patient not taking: Reported on 01/22/2021) 5.6 mL 0 Not Taking   Scheduled:   aspirin  81 mg Oral Daily   Chlorhexidine Gluconate Cloth  6 each Topical Daily   clopidogrel  75 mg Oral Daily   docusate  100 mg Per Tube BID  feeding supplement  237 mL Oral TID BM   furosemide  40 mg Oral Daily   ipratropium-albuterol  3 mL Nebulization Q6H   methylPREDNISolone (SOLU-MEDROL) injection  40 mg Intravenous Daily   metoprolol tartrate  25 mg Oral BID   multivitamin with minerals  1 tablet Oral Daily   pantoprazole (PROTONIX) IV  40 mg Intravenous Daily   pneumococcal 23 valent vaccine  0.5 mL Intramuscular Tomorrow-1000   polyethylene glycol  17 g Oral Daily   rifampin  300 mg Oral BID WC   rosuvastatin  40 mg Oral Daily   sodium chloride flush  10-40 mL Intracatheter Q12H   Infusions:   sodium chloride 250 mL (01/29/21 1103)   heparin 1,300 Units/hr (01/30/21 1057)   promethazine (PHENERGAN) injection (IM or IVPB) Stopped (01/26/21 1902)   vancomycin 1,000 mg (01/29/21 1105)   PRN: bisacodyl, diazepam, ipratropium-albuterol, lactulose, lip balm, [DISCONTINUED] metoCLOPramide **OR** metoCLOPramide (REGLAN) injection, morphine injection, nitroGLYCERIN, [DISCONTINUED] promethazine **OR** promethazine (PHENERGAN) injection (IM or IVPB) **OR** promethazine, sodium chloride flush  Assessment: Pharmacy consulted to start heparin for ACS. No DOAC PTA. Trop 1374.     6/21 0509 0.15 heparin 2400 units x 1 and increase infusion to 1300 units/hr.  6/21 1410 0.34   Goal of Therapy:  Heparin level 0.3-0.7  units/ml Monitor platelets by anticoagulation protocol: Yes   Plan:  Heparin level is therapeutic. Will continue heparin infusion at 1300 units/hr. Recheck heparin level in 8 hours. CBC daily while on heparin.   Ronnald Ramp, PharmD 01/30/2021,2:48 PM

## 2021-01-30 NOTE — Progress Notes (Addendum)
Progress Note  Patient Name: Monica [name] Date of Encounter: 01/30/2021  Primary Cardiologist: Lorine Bears, MD  Subjective   Reports chest wall pain today - sl worse w/ palpation. On high flow O2, noted to have episodes of O2 desat, however, pt denies shortness of breath.    Inpatient Medications    Scheduled Meds:  aspirin  81 mg Oral Daily   Chlorhexidine Gluconate Cloth  6 each Topical Daily   clopidogrel  75 mg Oral Daily   docusate  100 mg Per Tube BID   furosemide  40 mg Oral Daily   ipratropium-albuterol  3 mL Nebulization Q6H   methylPREDNISolone (SOLU-MEDROL) injection  40 mg Intravenous Daily   metoprolol tartrate  25 mg Oral BID   multivitamin with minerals  1 tablet Oral Daily   pantoprazole (PROTONIX) IV  40 mg Intravenous Daily   pneumococcal 23 valent vaccine  0.5 mL Intramuscular Tomorrow-1000   polyethylene glycol  17 g Oral Daily   rifampin  300 mg Oral BID WC   rosuvastatin  40 mg Oral Daily   sodium chloride flush  10-40 mL Intracatheter Q12H   Continuous Infusions:  sodium chloride 250 mL (01/29/21 1103)   heparin 1,300 Units/hr (01/30/21 1057)   promethazine (PHENERGAN) injection (IM or IVPB) Stopped (01/26/21 1902)   vancomycin 1,000 mg (01/29/21 1105)   PRN Meds: bisacodyl, diazepam, ipratropium-albuterol, lip balm, [DISCONTINUED] metoCLOPramide **OR** metoCLOPramide (REGLAN) injection, morphine injection, nitroGLYCERIN, [DISCONTINUED] promethazine **OR** promethazine (PHENERGAN) injection (IM or IVPB) **OR** promethazine, sodium chloride flush   Vital Signs    Vitals:   01/30/21 0400 01/30/21 0500 01/30/21 0847 01/30/21 1038  BP: 124/67 114/65 123/82 108/67  Pulse: 92 94  90  Resp: (!) 22 (!) 22 (!) 21 (!) 21  Temp: 97.9 F (36.6 C)  98 F (36.7 C) 98.1 F (36.7 C)  TempSrc: Oral  Oral Oral  SpO2: 94% 96% 93% 93%  Weight:  98.5 kg    Height:        Intake/Output Summary (Last 24 hours) at 01/30/2021 1128 Last data filed at  01/30/2021 1037 Gross per 24 hour  Intake 945.76 ml  Output 1475 ml  Net -529.24 ml   Filed Weights   01/24/21 1500 01/28/21 1635 01/30/21 0500  Weight: 102 kg 102 kg 98.5 kg    Physical Exam   GEN: Well nourished, well developed, in no acute distress. On high flow O2. HEENT: Grossly normal.  Neck: Supple, unable to assess JVP 2/2 line/positioning. Cardiac: RRR, tachy with 3/6 systolic murmur loudest @ the RUSB but heard throughout, no rubs, or gallops. No clubbing or cyanosis.  Radials 2+, DP/PT 2+ and equal bilaterally. Trace bilat lower ext edema, left notably more painful to touch than right. Respiratory:  Respirations regular and unlabored, crackles heard throughout bilateral lung fields. GI: Obese, soft, nontender, nondistended, BS + x 4. MS: no deformity or atrophy. Skin: warm and dry, no rash. Neuro:  Strength and sensation are intact. Psych: AAOx3.  Normal affect.  Labs    Chemistry Recent Labs  Lab 01/27/21 0528 01/28/21 0500 01/29/21 0455 01/30/21 0509  NA 130* 135 130*  --   K 3.9 3.3* 3.7  --   CL 93* 98 93*  --   CO2 28 29 28   --   GLUCOSE 117* 122* 132*  --   BUN 29* 28* 26*  --   CREATININE 0.92 0.98 0.91  --   CALCIUM 8.2* 8.3* 8.3*  --   PROT  --   --   --  6.4*  ALBUMIN  --   --   --  2.6*  AST  --   --   --  30  ALT  --   --   --  20  ALKPHOS  --   --   --  54  BILITOT  --   --   --  2.4*  GFRNONAA >60 57* >60  --   ANIONGAP 9 8 9   --      Hematology Recent Labs  Lab 01/28/21 0500 01/29/21 0455 01/30/21 0509  WBC 8.8 9.5 7.9  RBC 4.52 4.87 4.37  HGB 11.0* 11.6* 10.7*  HCT 34.8* 36.4 33.1*  MCV 77.0* 74.7* 75.7*  MCH 24.3* 23.8* 24.5*  MCHC 31.6 31.9 32.3  RDW 20.1* 20.8* 20.6*  PLT 175 212 243    Cardiac Enzymes  Recent Labs  Lab 01/20/21 1336 01/20/21 1515 01/29/21 1054 01/29/21 1241  TROPONINIHS 11 12 1,374* 1,349*    Lipids  Lab Results  Component Value Date   CHOL 143 12/23/2017   HDL 58 12/23/2017   LDLCALC 66  12/23/2017   TRIG 95 12/23/2017   CHOLHDL 2.5 12/23/2017    HbA1c  Lab Results  Component Value Date   HGBA1C 5.7 (H) 06/02/2017    Telemetry    Sinus Tachycardia with PVC's, 90-110's bpm - Personally Reviewed   Cardiac Studies   2D Echocardiogram 06.21.2022 1. Left ventricular ejection fraction, by estimation, is 30 to 35%. The left ventricle has moderately decreased function. The left ventricle demonstrates regional wall motion abnormalities (see scoring diagram/findings for description). There is moderate left ventricular hypertrophy. Left ventricular diastolic parameters are indeterminate. There is severe hypokinesis of the left ventricular, mid-apical anteroseptal wall, anterior wall, anterolateral wall and apical segment.   2. Right ventricular systolic function is normal. The right ventricular size is mildly enlarged. There is moderately elevated pulmonary artery systolic pressure.   3. Left atrial size was mildly dilated.   4. The mitral valve is degenerative. Mild mitral valve regurgitation. Mild mitral stenosis. Moderate mitral annular calcification.   5. The aortic valve is tricuspid. There is moderate calcification of the aortic valve. There is severe thickening of the aortic valve. Aortic valve regurgitation is trivial. Severe aortic valve stenosis. Aortic valve area, by VTI measures 0.54 cm. Aortic valve mean gradient measures 32.0 mmHg.   6. The inferior vena cava is normal in size with greater than 50% respiratory variability, suggesting right atrial pressure of 3 mmHg.  2D Echocardiogram 02.22.22 1. Left ventricular ejection fraction, by estimation, is 60 to 65%. The left ventricle has normal function. Left ventricular endocardial border not optimally defined to evaluate regional wall motion. There is mild left ventricular hypertrophy. Left ventricular diastolic parameters are consistent with Grade I diastolic dysfunction (impaired relaxation). Elevated left atrial  pressure.   2. Right ventricular systolic function is normal. The right ventricular size is mildly enlarged. Tricuspid regurgitation signal is inadequate for assessing PA pressure.   3. Left atrial size was mildly dilated.   4. Right atrial size was mildly dilated.   5. The mitral valve is degenerative. Trivial mitral valve regurgitation. Mild mitral stenosis. Moderate mitral annular calcification.   6. Tricuspid valve regurgitation at least moderate but suboptimally visualized.   7. The aortic valve is tricuspid. There is moderate calcification of the aortic valve. There is severe thickening of the aortic valve. Aortic valve regurgitation is not visualized. Severe aortic valve stenosis. Aortic valve area, by VTI measures 0.51 cm. Aortic valve  mean gradient measures 60.0 mmHg. Aortic valve Vmax measures 4.83 m/s.   8. The inferior vena cava is normal in size with greater than 50% respiratory variability, suggesting right atrial pressure of 3 mmHg.   Patient Profile     87 y.o. female with history of CAD with inferior STEMI in 05/2017 complicated by hypotension and bradycardia s/p PCI/DES to the proximal RCA, severe aortic stenosis, GI bleed in 2018, prolonged tobacco use quitting after MI, HLD and obesity, who was admitted 6/6 for post-op hip infection and was incidentally noted to have large AAA, now s/p repair. Post-op course has been complicated by ongoing respiratory failure requiring high flow O2 in the setting of pulm fibrosis.    Assessment & Plan    1. NSTEMI/CAD: pt w/ h/o CAD s/p RCA stenting in 2018. Prolonged hospital course this admission 2/2 hip infxn, AAA s/p EVAR, and resp failure. She developed chest pain in the AM of 6/20, for which she was given sl NTG, after having ss for about an hour, and it eventually subsided. 10:45 ECG showed subtle anteroseptal ST elevation. 6/20 echo with LVEF of 30-35% with severe hypokinesis of the left ventricular, mid-apical anteroseptal wall, anterior  wall, anterolateral wall and apical segment.  HsTrop elevated but flat (1374  1349), suggesting that we are likely on the back side of an event that occurred at some point earlier in the hospitalization.  She c/o chest wall tenderness this AM.  With ongoing resp failure req high flow O2, she remains a poor cath candidate.  Will cont heparin for another 24 hrs.  Cont asa, plavix, statin, and ? blocker.  If she has recurrent angina, we will likely need to pursue cath.  2. Ischemic cardiomyopathy:  As above, EF down to 30-35% w/ suggestion of recent anterior MI.  Appears euvolemic on exam.  Cont ? blocker.  BP somewhat soft this AM.  Will hold off on initiating ARB/ARNi/MRA @ this time, esp in light of fixed, severe AS.  3.  Acute on chronic resp failure: remains on HFNC. Crackles on exam in setting of fibrosis noted on CT chest 01/20/21.  Mgmt per IM.   4. AAA s/p EVAR: Per vasc surgery.   5. Severe Ao Stenosis: likely a poor TAVR candidate in setting of numerous comorbidities.  Defer w/u to outpt setting.  6. Post-surgical hip infection: antibiotics (vancomycin and rifampin) and wound vac mgmt per IM.   Signed, Nicolasa Ducking, NP  01/30/2021, 11:28 AM    For questions or updates, please contact   Please consult [email or web address] for contact info under Cardiology/STEMI.

## 2021-01-30 NOTE — Progress Notes (Signed)
Daily Progress Note   Patient Name: Monica [name]       Date: 01/30/2021 DOB: Dec 27, 1933  Age: 87 y.o. MRN#: [id number] Attending Physician: Arnetha Courser, MD Primary Care Physician: Patient, No Pcp Per (Inactive) Admit Date: 01/15/2021  Reason for Consultation/Follow-up: Establishing goals of care  Subjective: Patient is resting in bed no family at bedside.  She has been updated on diagnosis of NSTEMI yesterday.  She states she feels better than she did yesterday.  She remains on high flow O2 and is short of breath with conversation.  Recapped her diagnoses, current care, and poor prognosis.  Discussed different scenarios in depth, as well as attempting to have her outline what quality of life would be acceptable to her and what would not.  She shares to continue to observe previously discussed limits to care (DNR/DNI, no feeding tube), but outside of that, she needs to continue with her current course of care to treat the treatable even if it meant a scenario of potentially dying in the hospital or in another care center; she states she needs to do this for her family.  Length of Stay: 15  Current Medications: Scheduled Meds:  . aspirin  81 mg Oral Daily  . Chlorhexidine Gluconate Cloth  6 each Topical Daily  . clopidogrel  75 mg Oral Daily  . docusate  100 mg Per Tube BID  . furosemide  40 mg Oral Daily  . ipratropium-albuterol  3 mL Nebulization Q6H  . methylPREDNISolone (SOLU-MEDROL) injection  40 mg Intravenous Daily  . metoprolol tartrate  25 mg Oral BID  . multivitamin with minerals  1 tablet Oral Daily  . pantoprazole (PROTONIX) IV  40 mg Intravenous Daily  . pneumococcal 23 valent vaccine  0.5 mL Intramuscular Tomorrow-1000  . polyethylene glycol  17 g Oral Daily  . rifampin  300  mg Oral BID WC  . rosuvastatin  40 mg Oral Daily  . sodium chloride flush  10-40 mL Intracatheter Q12H    Continuous Infusions: . sodium chloride 250 mL (01/29/21 1103)  . heparin 1,300 Units/hr (01/30/21 1057)  . promethazine (PHENERGAN) injection (IM or IVPB) Stopped (01/26/21 1902)  . vancomycin 1,000 mg (01/29/21 1105)    PRN Meds: bisacodyl, diazepam, ipratropium-albuterol, lip balm, [DISCONTINUED] metoCLOPramide **OR** metoCLOPramide (REGLAN) injection, morphine injection, nitroGLYCERIN, [DISCONTINUED] promethazine **OR** promethazine (  PHENERGAN) injection (IM or IVPB) **OR** promethazine, sodium chloride flush  Physical Exam Pulmonary:     Comments: Work of breathing noted Neurological:     Mental Status: She is alert.            Vital Signs: BP 108/67 (BP Location: Right Arm)   Pulse 90   Temp 98.1 F (36.7 C) (Oral)   Resp (!) 21   Ht 5\' 5"  (1.651 m)   Wt 98.5 kg   SpO2 93%   BMI 36.14 kg/m  SpO2: SpO2: 93 % O2 Device: O2 Device: High Flow Nasal Cannula O2 Flow Rate: O2 Flow Rate (L/min): 55 L/min  Intake/output summary:  Intake/Output Summary (Last 24 hours) at 01/30/2021 1211 Last data filed at 01/30/2021 1037 Gross per 24 hour  Intake 945.76 ml  Output 1475 ml  Net -529.24 ml   LBM: Last BM Date: 01/29/21 Baseline Weight: Weight: 106.6 kg Most recent weight: Weight: 98.5 kg         Patient Active Problem List   Diagnosis Date Noted  . Non-ST elevation (NSTEMI) myocardial infarction (HCC)   . Hypoxia   . Shortness of breath   . Pre-op evaluation   . Abdominal aortic aneurysm (AAA) without rupture (HCC)   . Obesity, Class III, BMI 40-49.9 (morbid obesity) (HCC)   . Abscess of left hip 01/15/2021  . Acute respiratory failure with hypoxia (HCC) 10/05/2020  . Severe aortic stenosis   . Closed comminuted intertrochanteric fracture of proximal end of left femur (HCC) 10/02/2020  . Aortic stenosis   . Obesity   . Cellulitis of left lower extremity  04/29/2018  . GI bleed 06/05/2017  . Nausea vomiting and diarrhea   . Acute ST elevation myocardial infarction (STEMI) of inferior wall (HCC) 06/02/2017  . CAD (coronary artery disease) 2018    Palliative Care Assessment & Plan    Recommendations/Plan: Continue to treat the treatable and current care.    Code Status:    Code Status Orders  (From admission, onward)           Start     Ordered   01/23/21 1543  Do not attempt resuscitation (DNR)  Continuous       Question Answer Comment  In the event of cardiac or respiratory ARREST Do not call a "code blue"   In the event of cardiac or respiratory ARREST Do not perform Intubation, CPR, defibrillation or ACLS   In the event of cardiac or respiratory ARREST Use medication by any route, position, wound care, and other measures to relive pain and suffering. May use oxygen, suction and manual treatment of airway obstruction as needed for comfort.   Comments MOST form on chart.      01/23/21 1542           Code Status History     Date Active Date Inactive Code Status Order ID Comments User Context   01/15/2021 2309 01/23/2021 1542 Full Code 01/25/2021  [id number], MD Inpatient   10/02/2020 1117 10/09/2020 2150 Full Code 2151  [id number], MD ED   04/30/2018 1018 05/02/2018 1808 DNR 05/04/2018  [id number], MD Inpatient   04/29/2018 1732 04/30/2018 1018 Full Code 05/02/2018  [id number], MD Inpatient   06/05/2017 0521 06/07/2017 1404 Full Code 06/09/2017  [id number], MD Inpatient   06/02/2017 0059 06/04/2017 1932 Full Code 06/06/2017  [id number], MD Inpatient      Advance Directive Documentation    Flowsheet Row Most Recent  Value  Type of Advance Directive Healthcare Power of Attorney, Living will  Pre-existing out of facility DNR order (yellow form or pink MOST form) --  "MOST" Form in Place? --       Prognosis: Very poor    Care plan was discussed with RN and primary MD  Thank you for  allowing the Palliative Medicine Team to assist in the care of this patient.       Total Time 35 min Prolonged Time Billed  no       Greater than 50%  of this time was spent counseling and coordinating care related to the above assessment and plan.  Morton Stall, NP  Please contact Palliative Medicine Team phone at [phone] for questions and concerns.

## 2021-01-30 NOTE — Progress Notes (Signed)
PROGRESS NOTE    Monica [name]  OEV:[id number] DOB: Dec 20, 1933 DOA: 01/15/2021 PCP: Patient, No Pcp Per (Inactive)   Brief Narrative:  Monica [name] is a 87 y.o. F with CAD s/p PCI x1 in 2018, severe AS being worked up for TAVR, Obesity, pHTN, and COPD who presented with hip pain.   Had hip fracture after fall in Feb, repaired and discharged home.  Since being home 4 months ago, really never able to walk due to pain. Finally, in the last week before admission, developed redness and worse pain at left hip surgical site, then wound dehisced.   Presented to the ER and Orthopedics were consulted for suspected post-surgical hip infection with MRSA.  ID is recommending 4 weeks of vancomycin and rifampin.    Also noted incidentally to have very large AAA, she was taken to the OR for endovascular repair with vascular surgery today.  Also had severe aortic stenosis and plan is for TAVR work-up after AAA repair, most likely as an outpatient if able to get out of hospital from current admission.  She will not be a good candidate due to multiple comorbidities.  Patient with multiple life-threatening comorbidities-high risk for deterioration and death, palliative care was also consulted, patient is now on DNR/DNI.  She will consider comfort care if deteriorates. At this time we will continue current level of care.  Patient had her endovascular AAA repair done with vascular surgery on 01/24/2021, she remained intubated and in ICU for 2 days after that, extubated yesterday and we will resume care again from today.  Remained on heated high flow, weak cough and appears very lethargic.  6/18: Appears more lethargic with worsening oxygen requirement, today at 50 L and 65% FiO2  6/19: Little improvement in FiO2, started taking few sips here and there.  6/20: Patient remains on heated high flow at 55 L with 50% FiO2.  Developed chest pain which he responded to nitroglycerin.  Troponin elevated at 1374, EKG with  nonspecific T wave changes.  Concern of NSTEMI.  Cardiology was reconsulted, they are recommending conservative management.  She was started on heparin infusion after consulting vascular surgery as she recently had her endovascular AAA repair.  6/21: Echocardiogram with newly reduced EF to 30 to 35% with regional wall motion abnormalities consistent with recent anterior MI.  Not a candidate for cardiac catheterization.  Medical management per cardiology.  Remains on high level of oxygen with heated high flow.  Palliative care again met her but she wants to keep pushing, prognosis is very guarded due to multiple life limiting comorbidities.  Very high risk for deterioration and death.  Subjective: Patient was seen and examined today.  She wants to continue with current management, denies any chest pain but does experience some chest wall tenderness.  Mild shortness of breath, remained on heated high flow.  Assessment & Plan:   Active Problems:   Abscess of left hip   Abdominal aortic aneurysm (AAA) without rupture (HCC)   Obesity, Class III, BMI 40-49.9 (morbid obesity) (HCC)   Pre-op evaluation   Shortness of breath   Hypoxia   Non-ST elevation (NSTEMI) myocardial infarction ([place])  Chest pain/NSTEMI.  Concern of NSTEMI with markedly elevated troponin.  Chest pain responded to nitroglycerin.  History of CAD with PCI to RCA.  Echocardiogram with new onset of reduced EF with regional wall motion abnormalities consistent with recent anterior MI. Cardiology was reconsulted and they are recommending conservative management. Cardiology started her on heparin infusion after consulting  vascular surgery. She was also started on aspirin and Plavix. Patient will need beta-blocker which will be started once blood pressure allows.  Postsurgical hip infection S/p debridement of the left hip wound by Dr. Posey Pronto, 6/7 Admitted and taken to OR 6/7 for washout.  Intraoperative cultures growing MRSA in 3/3. ID  consulted, recommended treating with 4 weeks antibiotics for presumed MRSA prosthetic infection.  1/2 blood cultures positive for staph epidermidis which was thought to be a contaminant. -Orthopedic remove the suture and drain today-no obvious sign of infection according to their note. 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  Patient was evaluated by vascular surgery, who recommended urgent repair given the size.  She is not a candidate for open repair.  She was taken to the OR for endovascular repair-tolerated the procedure well, stable from vascular standpoint and will follow-up as an outpatient. -Appreciate vascular help.  Lower abdominal pain.  Operative sites looks okay.  Patient did not had any bowel movement for many days. -Continue with bowel regimen -Continue with pain management  Severe aortic stenosis Previously known, since hip fracture in Feb.  Was pending TAVR eval prior to this admission, seen by Cardiology here.  -No plan to start TAVR work-up while she is in the hospital due to other comorbidities and life-threatening conditions.  Most likely will be done as an outpatient.    Pulmonary hypertension due to AS, CHF   Hypotension due to aortic stenosis -Continue midodrine   Chronic diastolic CHF -Defer diuretic to Cardiology-Lasix was held initially for the past couple of days, worsening hypoxia with basal crackles.  Acute on chronic hypoxic respiratory failure.  Mild improvement in FiO2 requirement today.  Otherwise remained on heated high flow at 45 L and 50% FiO2. Has chronic hypoxic respiratory failure due to aortic stenosis, heart failure, and pulmonary hypertension.  COPD is in chart but appears to be mild. Former smoker.  Uses 2 L of oxygen at baseline.  Initially requiring 2 to 3 L and then got worse requiring up to 10 L starting from 6/12. Chest x-ray unremarkable, CT angiogram of the  chest with patchy subpleural fibrosis, no PE, moderate atelectasis, no edema or large consolidation, no effusion. Patient required prolonged intubation after the procedure, extubated but continued to require very high levels of oxygen. - Consult to pulmonogy-appreciate their help - Continue Solumedrol BID for NSIP    Coronary artery disease, secondary prevention - Continue Crestor   Iron deficiency anemia Hgb stable Given Feraheme 6/10 -Transfusion threshold 8 g/dL  Hyponatremia.  Resolved Mild, asymptomatic  Vaginal candidiasis Given Fluconazole 1 dose on 6/10.   Left renal nodule Incidental finding, minor, given gravity of AAA and AS and hypoxia, this likely has not been discussed with patient and should be followed up only depending on goals of care after AAA repair - Follow-up MRI as an outpatient  Class II obesity. Estimated body mass index is 36.14 kg/m as calculated from the following:   Height as of this encounter: 5' 5"  (1.651 m).   Weight as of this encounter: 98.5 kg.   Goals of care.  Patient with multiple life limiting comorbidities, very high risk for deterioration and death.  Palliative care is involved.  Currently we will continue current level of care but if she deteriorates they will consider comfort care.  She is now DNR/DNI She wants to keep pushing herself at this time, has full capacity to  make her own decision.  Family is leaning more towards comfort measure.  Objective: Vitals:   01/30/21 0400 01/30/21 0500 01/30/21 0847 01/30/21 1038  BP: 124/67 114/65 123/82 108/67  Pulse: 92 94  90  Resp: (!) 22 (!) 22 (!) 21 (!) 21  Temp: 97.9 F (36.6 C)  98 F (36.7 C) 98.1 F (36.7 C)  TempSrc: Oral  Oral Oral  SpO2: 94% 96% 93% 93%  Weight:  98.5 kg    Height:        Intake/Output Summary (Last 24 hours) at 01/30/2021 1421 Last data filed at 01/30/2021 1037 Gross per 24 hour  Intake 515.76 ml  Output 925 ml  Net -409.24 ml    Filed Weights    01/24/21 1500 01/28/21 1635 01/30/21 0500  Weight: 102 kg 102 kg 98.5 kg    Examination:  General.  Chronically ill-appearing elderly lady, in no acute distress. Pulmonary.  Bilateral crackles, normal respiratory effort. CV.  Regular rate and rhythm, no JVD, rub or murmur. Abdomen.  Soft, nontender, nondistended, BS positive. CNS.  Alert and oriented x3.  No focal neurologic deficit. Extremities.  No edema, no cyanosis, pulses intact and symmetrical. Psychiatry.  Judgment and insight appears normal.   DVT prophylaxis: Heparin Code Status: DNR Family Communication: Son was updated at bedside. Disposition Plan:  Status is: Inpatient  Remains inpatient appropriate because:Inpatient level of care appropriate due to severity of illness  Dispo: The patient is from:  Home              Anticipated d/c is to:  To be determined              Patient currently is not medically stable to d/c.   Difficult to place patient No                Level of care: Progressive Cardiac  All the records are reviewed and case discussed with Care Management/Social Worker. Management plans discussed with the patient, nursing and they are in agreement.  Consultants:  Vascular surgery Pulmonology Cardiology  Procedures:  Antimicrobials:  Vancomycin Rifampin  Data Reviewed: I have personally reviewed following labs and imaging studies  CBC: Recent Labs  Lab 01/26/21 0917 01/27/21 0528 01/28/21 0500 01/29/21 0455 01/30/21 0509  WBC 8.7 8.9 8.8 9.5 7.9  HGB 11.8* 11.3* 11.0* 11.6* 10.7*  HCT 37.8 36.4 34.8* 36.4 33.1*  MCV 76.4* 76.5* 77.0* 74.7* 75.7*  PLT 165 170 175 212 832    Basic Metabolic Panel: Recent Labs  Lab 01/25/21 0534 01/26/21 0917 01/27/21 0528 01/28/21 0500 01/29/21 0455  NA 135 136 130* 135 130*  K 4.6 3.9 3.9 3.3* 3.7  CL 95* 96* 93* 98 93*  CO2 31 29 28 29 28   GLUCOSE 121* 130* 117* 122* 132*  BUN 29* 37* 29* 28* 26*  CREATININE 0.93  1.02* 1.16* 0.92 0.98  0.91  CALCIUM 8.5* 8.5* 8.2* 8.3* 8.3*  MG  --   --  2.4 2.2 2.0  PHOS  --   --  2.8 2.3* 2.5    GFR: Estimated Creatinine Clearance: 52.5 mL/min (by C-G formula based on SCr of 0.91 mg/dL). Liver Function Tests: Recent Labs  Lab 01/30/21 0509  AST 30  ALT 20  ALKPHOS 54  BILITOT 2.4*  PROT 6.4*  ALBUMIN 2.6*    No results for input(s): LIPASE, AMYLASE in the last 168 hours. No results for input(s): AMMONIA in the last 168 hours. Coagulation Profile: Recent Labs  Lab  01/29/21 1336  INR 1.4*   Cardiac Enzymes: No results for input(s): CKTOTAL, CKMB, CKMBINDEX, TROPONINI in the last 168 hours. BNP (last 3 results) No results for input(s): PROBNP in the last 8760 hours. HbA1C: No results for input(s): HGBA1C in the last 72 hours. CBG: Recent Labs  Lab 01/24/21 1515  GLUCAP 141*    Lipid Profile: No results for input(s): CHOL, HDL, LDLCALC, TRIG, CHOLHDL, LDLDIRECT in the last 72 hours. Thyroid Function Tests: No results for input(s): TSH, T4TOTAL, FREET4, T3FREE, THYROIDAB in the last 72 hours. Anemia Panel: No results for input(s): VITAMINB12, FOLATE, FERRITIN, TIBC, IRON, RETICCTPCT in the last 72 hours. Sepsis Labs: No results for input(s): PROCALCITON, LATICACIDVEN in the last 168 hours.  No results found for this or any previous visit (from the past 240 hour(s)).    Radiology Studies: ECHOCARDIOGRAM COMPLETE  Result Date: 01/29/2021    ECHOCARDIOGRAM REPORT   Patient Name:   Monica [name] Date of Exam: 01/29/2021 Medical Rec #:  [id number]      Height:       65.0 in Accession #:    [id number]     Weight:       224.9 lb Date of Birth:  1934/01/01      BSA:          2.079 m Patient Age:    7 years       BP:           104/75 mmHg Patient Gender: F              HR:           110 bpm. Exam Location:  [facility] Procedure: Color Doppler and Cardiac Doppler Indications:     Chest pain R07.9  History:         Patient has prior history of Echocardiogram examinations, most                   recent 10/03/2020. CAD. Aortic valve stenosis.  Sonographer:     Michaelle Birks RT Referring Phys:  [place] Diagnosing Phys: Nelva Bush MD IMPRESSIONS  1. Left ventricular ejection fraction, by estimation, is 30 to 35%. The left ventricle has moderately decreased function. The left ventricle demonstrates regional wall motion abnormalities (see scoring diagram/findings for description). There is moderate left ventricular hypertrophy. Left ventricular diastolic parameters are indeterminate. There is severe hypokinesis of the left ventricular, mid-apical anteroseptal wall, anterior wall, anterolateral wall and apical segment.  2. Right ventricular systolic function is normal. The right ventricular size is mildly enlarged. There is moderately elevated pulmonary artery systolic pressure.  3. Left atrial size was mildly dilated.  4. The mitral valve is degenerative. Mild mitral valve regurgitation. Mild mitral stenosis. Moderate mitral annular calcification.  5. The aortic valve is tricuspid. There is moderate calcification of the aortic valve. There is severe thickening of the aortic valve. Aortic valve regurgitation is trivial. Severe aortic valve stenosis. Aortic valve area, by VTI measures 0.54 cm. Aortic valve mean gradient measures 32.0 mmHg.  6. The inferior vena cava is normal in size with greater than 50% respiratory variability, suggesting right atrial pressure of 3 mmHg. FINDINGS  Left Ventricle: Left ventricular ejection fraction, by estimation, is 30 to 35%. The left ventricle has moderately decreased function. The left ventricle demonstrates regional wall motion abnormalities. Severe hypokinesis of the left ventricular, mid-apical anteroseptal wall, anterior wall, anterolateral wall and apical segment. The left ventricular internal cavity size was normal in size.  There is moderate left ventricular hypertrophy. Left ventricular diastolic parameters are indeterminate.  Right Ventricle: The right ventricular size is mildly enlarged. No increase in right ventricular wall thickness. Right ventricular systolic function is normal. There is moderately elevated pulmonary artery systolic pressure. The tricuspid regurgitant velocity is 3.58 m/s, and with an assumed right atrial pressure of 3 mmHg, the estimated right ventricular systolic pressure is 16.1 mmHg. Left Atrium: Left atrial size was mildly dilated. Right Atrium: Right atrial size was normal in size. Pericardium: There is no evidence of pericardial effusion. Mitral Valve: The mitral valve is degenerative in appearance. There is mild thickening of the mitral valve leaflet(s). Moderate mitral annular calcification. Mild mitral valve regurgitation. Mild mitral valve stenosis. Tricuspid Valve: The tricuspid valve is grossly normal. Tricuspid valve regurgitation is trivial. Aortic Valve: The aortic valve is tricuspid. There is moderate calcification of the aortic valve. There is severe thickening of the aortic valve. Aortic valve regurgitation is trivial. Severe aortic stenosis is present. Aortic valve mean gradient measures 32.0 mmHg. Aortic valve peak gradient measures 45.1 mmHg. Aortic valve area, by VTI measures 0.54 cm. Pulmonic Valve: The pulmonic valve was grossly normal. Pulmonic valve regurgitation is not visualized. No evidence of pulmonic stenosis. Aorta: The aortic root is normal in size and structure. Pulmonary Artery: The pulmonary artery is of normal size. Venous: The inferior vena cava is normal in size with greater than 50% respiratory variability, suggesting right atrial pressure of 3 mmHg. IAS/Shunts: The interatrial septum was not well visualized.  LEFT VENTRICLE PLAX 2D LVIDd:         3.81 cm  Diastology LVIDs:         2.83 cm  LV e' medial:    12.20 cm/s LV PW:         1.40 cm  LV E/e' medial:  7.1 LV IVS:        1.38 cm  LV e' lateral:   2.94 cm/s LVOT diam:     2.00 cm  LV E/e' lateral: 29.4 LV SV:         34  LV SV Index:   16 LVOT Area:     3.14 cm  RIGHT VENTRICLE RV Basal diam:  4.88 cm RV S prime:     10.40 cm/s TAPSE (M-mode): 2.2 cm LEFT ATRIUM           Index       RIGHT ATRIUM           Index LA diam:      3.70 cm 1.78 cm/m  RA Area:     11.60 cm 5.22 cm/m LA Vol (A4C): 77.5 ml 37.27 ml/m RA Volume:   25.40 ml  12.22 ml/m  AORTIC VALVE                    PULMONIC VALVE AV Area (Vmax):    0.57 cm     PV Vmax:        0.87 m/s AV Area (Vmean):   0.55 cm     PV Peak grad:   3.1 mmHg AV Area (VTI):     0.54 cm     RVOT Peak grad: 3 mmHg AV Vmax:           335.67 cm/s AV Vmean:          253.000 cm/s AV VTI:            0.626 m AV Peak Grad:      45.1  mmHg AV Mean Grad:      32.0 mmHg LVOT Vmax:         61.40 cm/s LVOT Vmean:        44.100 cm/s LVOT VTI:          0.107 m LVOT/AV VTI ratio: 0.17  AORTA Ao Root diam: 3.60 cm MITRAL VALVE                TRICUSPID VALVE MV Area (PHT): 4.26 cm     TR Peak grad:   51.3 mmHg MV Area VTI:   1.23 cm     TR Vmax:        358.00 cm/s MV VTI:        0.27 m MV Decel Time: 178 msec     SHUNTS MV E velocity: 86.50 cm/s   Systemic VTI:  0.11 m MV A velocity: 143.00 cm/s  Systemic Diam: 2.00 cm MV E/A ratio:  0.60 Christopher End MD Electronically signed by Nelva Bush MD Signature Date/Time: 01/29/2021/2:46:32 PM    Final     Scheduled Meds:  aspirin  81 mg Oral Daily   Chlorhexidine Gluconate Cloth  6 each Topical Daily   clopidogrel  75 mg Oral Daily   docusate  100 mg Per Tube BID   furosemide  40 mg Oral Daily   ipratropium-albuterol  3 mL Nebulization Q6H   methylPREDNISolone (SOLU-MEDROL) injection  40 mg Intravenous Daily   metoprolol tartrate  25 mg Oral BID   multivitamin with minerals  1 tablet Oral Daily   pantoprazole (PROTONIX) IV  40 mg Intravenous Daily   pneumococcal 23 valent vaccine  0.5 mL Intramuscular Tomorrow-1000   polyethylene glycol  17 g Oral Daily   rifampin  300 mg Oral BID WC   rosuvastatin  40 mg Oral Daily   sodium chloride  flush  10-40 mL Intracatheter Q12H   Continuous Infusions:  sodium chloride 250 mL (01/29/21 1103)   heparin 1,300 Units/hr (01/30/21 1057)   promethazine (PHENERGAN) injection (IM or IVPB) Stopped (01/26/21 1902)   vancomycin 1,000 mg (01/29/21 1105)     LOS: 15 days   Time spent: 40 minutes More than 50% of the time was spent in counseling/coordination of care  Lorella Nimrod, MD [facility]  If 7PM-7AM, please contact night-coverage [email or web address]  01/30/2021, 2:21 PM   This record has been created using Systems analyst. Errors have been sought and corrected,but may not always be located. Such creation errors do not reflect on the standard of care.

## 2021-01-30 NOTE — Progress Notes (Addendum)
Pt is requesting something for sleep, NP Jon Billings made aware. Will continue to monitor.  Update 2242: NP Jon Billings ordered melatonin 5 mg tablet daily at bedtime. Will continue to monitor.

## 2021-01-30 NOTE — Progress Notes (Signed)
Initial Nutrition Assessment  DOCUMENTATION CODES:  Obesity unspecified  INTERVENTION:  Continue current diet as ordered, advance consistency as able per SLP Ensure Enlive po BID, each supplement provides 350 kcal and 20 grams of protein 1 packet Juven BID, each packet provides 95 calories, 2.5 grams of protein (collagen), and 9.8 grams of carbohydrate (3 grams sugar); also contains 7 grams of L-arginine and L-glutamine, 300 mg vitamin C, 15 mg vitamin E, 1.2 mcg vitamin B-12, 9.5 mg zinc, 200 mg calcium, and 1.5 g  Calcium Beta-hydroxy-Beta-methylbutyrate to support wound healing Multivitamin with minerals daily  NUTRITION DIAGNOSIS:  Inadequate oral intake related to poor appetite as evidenced by meal completion < 50%.  GOAL:  Patient will meet greater than or equal to 90% of their needs  MONITOR:  PO intake, Supplement acceptance, Labs  REASON FOR ASSESSMENT:  Malnutrition Screening Tool    ASSESSMENT:  87 y.o. female with a past medical history of CAD, hyperlipidemia presented to ED for drainage from her left hip. Had left hip replacement in February and according to patient, the incision/scar opened up and began draining 6/5.  Patient noticed redness swelling and tenderness around the incision as well recently.  Pt has had a complicated hospitalization. Underwent I&D with debridement and placement of wound vac to the left hip 6/7. However, initial imaging in ED also showed a partially visualized AAA.   Vascular Surgery consulted and recommended repair as aneurism was large and pt was at high risk of rupture. Pt had multiple comorbidities which put her at risk of death during surgery. Elected to proceed and underwent repair 6/15. Remained intubated post-operatively but extubated 6/16.   Pt continued to feel poorly and weak after surgery. Reported chest pain 6/20 and was found to have had an NSTEMI.   Palliative care has been following pt. Pt has expressed on multiple occasions  she would not want a feeding tube, CPR, or intubation.   Pt resting in bed at the time of visit. Lunch tray at bedside, untouched aside from a few bites of magic cup. Pt states she has not had an appetite and does not feel like eating. Talked with pt about the need to provide her body with nutrition in order to support recovery. Pt expresses understanding and reports she will try and eat more. Also agreeable to ensure supplements augment intake.  Weight loss noted this admission, unsure of degree of loss as it appears that pt's initial weight was stated. I&O indicate pt is -15L since admission, Will monitor weight trends..  Average Meal Intake: 6/6-6/21: 33% intake x 7 recorded meals (0-100%) noted refusal of meals for the last 3 days  Nutritionally Relevant Medications: Scheduled Meds:  docusate  100 mg Per Tube BID   furosemide  40 mg Oral Daily   methylPREDNISolone (SOLU-MEDROL) injection  40 mg Intravenous Daily   multivitamin with minerals  1 tablet Oral Daily   pantoprazole (PROTONIX) IV  40 mg Intravenous Daily   polyethylene glycol  17 g Oral Daily   rosuvastatin  40 mg Oral Daily   Continuous Infusions:  sodium chloride 250 mL (01/29/21 1103)   vancomycin 1,000 mg (01/29/21 1105)   PRN Meds:.bisacodyl, metoCLOPramide, promethazine   Labs Reviewed: 6/20 Na 130 / chloride 93 BUN 26  NUTRITION - FOCUSED PHYSICAL EXAM: Flowsheet Row Most Recent Value  Orbital Region No depletion  Upper Arm Region No depletion  Thoracic and Lumbar Region No depletion  Buccal Region No depletion  Temple Region Mild depletion  Clavicle Bone Region No depletion  Clavicle and Acromion Bone Region Mild depletion  Scapular Bone Region No depletion  Dorsal Hand Mild depletion  Patellar Region No depletion  Anterior Thigh Region No depletion  Posterior Calf Region No depletion  Edema (RD Assessment) Mild  Hair Reviewed  Eyes Reviewed  Mouth Reviewed  Skin Reviewed  Nails Reviewed   Diet  Order:   Diet Order             DIET - DYS 1 Room service appropriate? Yes; Fluid consistency: Thin  Diet effective now                   EDUCATION NEEDS:  Education needs have been addressed  Skin:  Skin Assessment: Skin Integrity Issues: Skin Integrity Issues:: Other (Comment), Incisions, Wound VAC, Stage II Stage II: sacrum Wound Vac: left hip Incisions: abdomen, left hip Other: MASD to the thighs  Last BM:  6/21 - type 4  Height:  Ht Readings from Last 1 Encounters:  01/24/21 5\' 5"  (1.651 m)   Weight:  Wt Readings from Last 1 Encounters:  01/30/21 98.5 kg    Ideal Body Weight:  56.8 kg  BMI:  Body mass index is 36.14 kg/m.  Estimated Nutritional Needs:  Kcal:  1900-2100 kcal/d Protein:  100-115 g/d Fluid:  2L/d   02/01/21, RD, LDN Clinical Dietitian Pager on Amion

## 2021-01-30 NOTE — Progress Notes (Signed)
Pt complaining of constipation today. Pt was administered a suppository and miralax without any results. MD ordered lactulose which was administered. Pt had 1 medium sized BM, entirely liquid. Pt still feels like she needs to have a BM. Night nurse made aware.

## 2021-01-31 DIAGNOSIS — I714 Abdominal aortic aneurysm, without rupture: Secondary | ICD-10-CM | POA: Diagnosis not present

## 2021-01-31 DIAGNOSIS — I214 Non-ST elevation (NSTEMI) myocardial infarction: Secondary | ICD-10-CM | POA: Diagnosis not present

## 2021-01-31 LAB — HEPARIN LEVEL (UNFRACTIONATED)
Heparin Unfractionated: 0.49 IU/mL (ref 0.30–0.70)
Heparin Unfractionated: 0.52 IU/mL (ref 0.30–0.70)

## 2021-01-31 LAB — BRAIN NATRIURETIC PEPTIDE: B Natriuretic Peptide: 1191.7 pg/mL — ABNORMAL HIGH (ref 0.0–100.0)

## 2021-01-31 LAB — CBC
HCT: 33.9 % — ABNORMAL LOW (ref 36.0–46.0)
Hemoglobin: 10.9 g/dL — ABNORMAL LOW (ref 12.0–15.0)
MCH: 24.1 pg — ABNORMAL LOW (ref 26.0–34.0)
MCHC: 32.2 g/dL (ref 30.0–36.0)
MCV: 75 fL — ABNORMAL LOW (ref 80.0–100.0)
Platelets: 288 10*3/uL (ref 150–400)
RBC: 4.52 MIL/uL (ref 3.87–5.11)
RDW: 21 % — ABNORMAL HIGH (ref 11.5–15.5)
WBC: 9.1 10*3/uL (ref 4.0–10.5)
nRBC: 0 % (ref 0.0–0.2)

## 2021-01-31 LAB — CREATININE, SERUM
Creatinine, Ser: 1.03 mg/dL — ABNORMAL HIGH (ref 0.44–1.00)
GFR, Estimated: 53 mL/min — ABNORMAL LOW (ref >60)

## 2021-01-31 LAB — VANCOMYCIN, PEAK: Vancomycin Pk: 44 ug/mL — ABNORMAL HIGH (ref 30–40)

## 2021-01-31 MED ORDER — METOPROLOL SUCCINATE ER 25 MG PO TB24
25.0000 mg | ORAL_TABLET | Freq: Every day | ORAL | Status: DC
  Administered 2021-01-31 – 2021-02-02 (×3): 25 mg via ORAL
  Filled 2021-01-31 (×3): qty 1

## 2021-01-31 MED ORDER — LISINOPRIL 5 MG PO TABS
2.5000 mg | ORAL_TABLET | Freq: Every day | ORAL | Status: DC
  Administered 2021-01-31 – 2021-02-08 (×8): 2.5 mg via ORAL
  Filled 2021-01-31 (×9): qty 1

## 2021-01-31 NOTE — Progress Notes (Signed)
OT Cancellation Note  Patient Details Name: Monica [name] MRN: [id number] DOB: 27-Jun-1934   Cancelled Treatment:    Reason Eval/Treat Not Completed: Patient declined, no reason specified;Other (comment). OT continues to follow pt for tx. Per physical therapist pt declining all therapy services until next date 2/2 generalized pain and discomfort. Will continue to follow and treat as available and pt medically appropriate.   Rockney Ghee, M.S., OTR/L Ascom: [phone] 01/31/21, 1:38 PM

## 2021-01-31 NOTE — Progress Notes (Signed)
PROGRESS NOTE    Monica [name]  OQH:[id number] DOB: 27-Mar-1934 DOA: 01/15/2021 PCP: Patient, No Pcp Per (Inactive)   Brief Narrative:  Mrs. Monica [name] is a 87 y.o. F with CAD s/p PCI x1 in 2018, severe AS being worked up for TAVR, Obesity, pHTN, and COPD who presented with hip pain.   Had hip fracture after fall in Feb, repaired and discharged home.  Since being home 4 months ago, really never able to walk due to pain. Finally, in the last week before admission, developed redness and worse pain at left hip surgical site, then wound dehisced.   Presented to the ER and Orthopedics were consulted for suspected post-surgical hip infection with MRSA.  ID is recommending 4 weeks of vancomycin and rifampin.    Also noted incidentally to have very large AAA, she was taken to the OR for endovascular repair with vascular surgery today.  Also had severe aortic stenosis and plan is for TAVR work-up after AAA repair, most likely as an outpatient if able to get out of hospital from current admission.  She will not be a good candidate due to multiple comorbidities.  Patient with multiple life-threatening comorbidities-high risk for deterioration and death, palliative care was also consulted, patient is now on DNR/DNI.  She will consider comfort care if deteriorates. At this time we will continue current level of care.  Patient had her endovascular AAA repair done with vascular surgery on 01/24/2021, she remained intubated and in ICU for 2 days after that, extubated yesterday and we will resume care again from today.  Remained on heated high flow, weak cough and appears very lethargic.  6/18: Appears more lethargic with worsening oxygen requirement, today at 50 L and 65% FiO2  6/19: Little improvement in FiO2, started taking few sips here and there.  6/20: Patient remains on heated high flow at 55 L with 50% FiO2.  Developed chest pain which he responded to nitroglycerin.  Troponin elevated at 1374, EKG with  nonspecific T wave changes.  Concern of NSTEMI.  Cardiology was reconsulted, they are recommending conservative management.  She was started on heparin infusion after consulting vascular surgery as she recently had her endovascular AAA repair.  6/21: Echocardiogram with newly reduced EF to 30 to 35% with regional wall motion abnormalities consistent with recent anterior MI.  Not a candidate for cardiac catheterization.  Medical management per cardiology.  Remains on high level of oxygen with heated high flow.  Palliative care again met her but she wants to keep pushing, prognosis is very guarded due to multiple life limiting comorbidities.  Very high risk for deterioration and death.  6/22-spoke to daughter via phone today discussed patient's prognosis.  Still on 9 L high flow.  Today patient was not feeling well and felt she was still short of breath.  After much discussion with daughter, she is agreeable to home with hospice.  We have consulted hospice to evaluate patient.  Subjective: Still short of breath.  No chest pain.  Remains on high flow 9 L today.   Assessment & Plan:   Active Problems:   Abscess of left hip   Abdominal aortic aneurysm (AAA) without rupture (HCC)   Obesity, Class III, BMI 40-49.9 (morbid obesity) (HCC)   Pre-op evaluation   Shortness of breath   Hypoxia   Non-ST elevation (NSTEMI) myocardial infarction (Monica [name])  Chest pain/NSTEMI.   Concern of NSTEMI with markedly elevated troponin.  Chest pain responded to nitroglycerin.  History of CAD with PCI to  RCA.  Echocardiogram with new onset of reduced EF with regional wall motion abnormalities consistent with recent anterior MI. Cardiology was reconsulted and they are recommending conservative management. Cardiology started her on heparin infusion after consulting vascular surgery. 6/22 she will need medical management with aspirin, Plavix x12 months Continue Ranexa for antianginal benefit Continue heparin drip x48  hours total (to complete today) Continue beta blk Cards added low dose ACEI Did speak to daughter about home with hospice, we have agreed to continue medical management for now until hospice evaluates her and she is able to go home with hospice.      Postsurgical hip infection S/p debridement of the left hip wound by Dr. Posey [name], 6/7 Admitted and taken to OR 6/7 for washout.  Intraoperative cultures growing MRSA in 3/3. ID consulted, recommended treating with 4 weeks antibiotics for presumed MRSA prosthetic infection.  1/2 blood cultures positive for staph epidermidis which was thought to be a contaminant. -Orthopedic remove the suture and drain today-no obvious sign of infection according to their note. -6/22-continue Rifampin and vanco  day 16/28.      Lower abdominal pain.  Operative sites looks okay.   Possible from constipation Continue bowel regimen   Severe aortic stenosis Previously known, since hip fracture in Feb.  Was pending TAVR eval prior to this admission, seen by Cardiology here.  6/22-no plan to start TAVR w/u while in hospital. Now plan for home with hospice .  Chronic DHF- on lasix and beta blk    Hypotension due to aortic stenosis Continue midodrine  Chronic DHF- on lasix and beta blk  Acute on chronic hypoxic respiratory failure.  Mild improvement in FiO2 requirement today.  Otherwise remained on heated high flow at 45 L and 50% FiO2. Has chronic hypoxic respiratory failure due to aortic stenosis, heart failure, and pulmonary hypertension.  COPD is in chart but appears to be mild. Former smoker.  Uses 2 L of oxygen at baseline.  Initially requiring 2 to 3 L and then got worse requiring up to 10 L starting from 6/12. Chest x-ray unremarkable, CT angiogram of the chest with patchy subpleural fibrosis, no PE, moderate atelectasis, no edema or large consolidation, no effusion. Patient required prolonged intubation after the procedure, extubated but continued to  require very high levels of oxygen. - Consult to pulmonogy-appreciate their help - Continue Solumedrol BID for NSIP   6/22- still requiring 9L HFNC. Appears more sob today.  Will ck bnp.  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  Patient was evaluated by vascular surgery, who recommended urgent repair given the size.  She is not a candidate for open repair.  She was taken to the OR for endovascular repair-tolerated the procedure well, stable from vascular standpoint and will follow-up as an outpatient. -Appreciate vascular help.   Iron deficiency anemia Hgb stable Given Feraheme 6/10 -Transfusion threshold 8 g/dL  Hyponatremia.  Resolved Mild, asymptomatic  Vaginal candidiasis Given Fluconazole 1 dose on 6/10.   Left renal nodule Incidental finding, minor, given gravity of AAA and AS and hypoxia, this likely has not been discussed with patient and should be followed up only depending on goals of care after AAA repair - Follow-up MRI as an outpatient  Class II obesity. Estimated body mass index is 35.73 kg/m as calculated from the following:   Height as of this encounter: 5' 5"  (1.651 m).   Weight as of  this encounter: 97.4 kg.   Goals of care.plan for hospice evaluation for home with hospice. For now will continue current regimen until hospice evaluates pt.   Objective: Vitals:   01/31/21 0400 01/31/21 0722 01/31/21 0806 01/31/21 1100  BP: 117/73  120/78 123/74  Pulse: 96  94 98  Resp: 20   20  Temp:   98 F (36.7 C) 98.2 F (36.8 C)  TempSrc:   Oral Oral  SpO2: 94% 93% 94% 91%  Weight:    97.4 kg  Height:        Intake/Output Summary (Last 24 hours) at 01/31/2021 1522 Last data filed at 01/31/2021 1432 Gross per 24 hour  Intake 1294.78 ml  Output 210 ml  Net 1084.78 ml   Filed Weights   01/28/21 1635 01/30/21 0500 01/31/21 1100  Weight: 102 kg 98.5 kg  97.4 kg    Examination: Appears mildly anxious, tired. Chronically ill Decrease bs, no wheezing Reg s1/s2 no gallop Soft benign +bs +edema b/l Awake and alert. Mood and affect appropriate in current setting  DVT prophylaxis: Heparin Code Status: DNR Family Communication: daughter updated Disposition Plan:  Status is: Inpatient  Remains inpatient appropriate because:Inpatient level of care appropriate due to severity of illness  Dispo: The patient is from:  Home              Anticipated d/c is to:  To be determined              Patient currently is not medically stable to d/c.   Difficult to place patient No                Level of care: Progressive Cardiac  Consultants:  Vascular surgery Pulmonology Cardiology  Procedures:  Antimicrobials:  Vancomycin Rifampin  Data Reviewed: I have personally reviewed following labs and imaging studies  CBC: Recent Labs  Lab 01/27/21 0528 01/28/21 0500 01/29/21 0455 01/30/21 0509 01/31/21 0404  WBC 8.9 8.8 9.5 7.9 9.1  HGB 11.3* 11.0* 11.6* 10.7* 10.9*  HCT 36.4 34.8* 36.4 33.1* 33.9*  MCV 76.5* 77.0* 74.7* 75.7* 75.0*  PLT 170 175 212 243 681   Basic Metabolic Panel: Recent Labs  Lab 01/25/21 0534 01/26/21 0917 01/27/21 0528 01/28/21 0500 01/29/21 0455 01/31/21 0757  NA 135 136 130* 135 130*  --   K 4.6 3.9 3.9 3.3* 3.7  --   CL 95* 96* 93* 98 93*  --   CO2 31 29 28 29 28   --   GLUCOSE 121* 130* 117* 122* 132*  --   BUN 29* 37* 29* 28* 26*  --   CREATININE 0.93  1.02* 1.16* 0.92 0.98 0.91 1.03*  CALCIUM 8.5* 8.5* 8.2* 8.3* 8.3*  --   MG  --   --  2.4 2.2 2.0  --   PHOS  --   --  2.8 2.3* 2.5  --    GFR: Estimated Creatinine Clearance: 46.1 mL/min (A) (by C-G formula based on SCr of 1.03 mg/dL (H)). Liver Function Tests: Recent Labs  Lab 01/30/21 0509  AST 30  ALT 20  ALKPHOS 54  BILITOT 2.4*  PROT 6.4*  ALBUMIN 2.6*   No results for input(s): LIPASE, AMYLASE in the last 168 hours. No results for  input(s): AMMONIA in the last 168 hours. Coagulation Profile: Recent Labs  Lab 01/29/21 1336  INR 1.4*   Cardiac Enzymes: No results for input(s): CKTOTAL, CKMB, CKMBINDEX, TROPONINI in the last 168 hours. BNP (last 3 results) No results  for input(s): PROBNP in the last 8760 hours. HbA1C: No results for input(s): HGBA1C in the last 72 hours. CBG: No results for input(s): GLUCAP in the last 168 hours. Lipid Profile: No results for input(s): CHOL, HDL, LDLCALC, TRIG, CHOLHDL, LDLDIRECT in the last 72 hours. Thyroid Function Tests: No results for input(s): TSH, T4TOTAL, FREET4, T3FREE, THYROIDAB in the last 72 hours. Anemia Panel: No results for input(s): VITAMINB12, FOLATE, FERRITIN, TIBC, IRON, RETICCTPCT in the last 72 hours. Sepsis Labs: No results for input(s): PROCALCITON, LATICACIDVEN in the last 168 hours.  No results found for this or any previous visit (from the past 240 hour(s)).    Radiology Studies: No results found.  Scheduled Meds:  aspirin  81 mg Oral Daily   Chlorhexidine Gluconate Cloth  6 each Topical Daily   clopidogrel  75 mg Oral Daily   docusate  100 mg Per Tube BID   feeding supplement  237 mL Oral TID BM   furosemide  40 mg Oral Daily   ipratropium-albuterol  3 mL Nebulization Q6H   lisinopril  2.5 mg Oral Daily   melatonin  5 mg Oral QHS   methylPREDNISolone (SOLU-MEDROL) injection  40 mg Intravenous Daily   metoprolol succinate  25 mg Oral Daily   multivitamin with minerals  1 tablet Oral Daily   pantoprazole (PROTONIX) IV  40 mg Intravenous Daily   pneumococcal 23 valent vaccine  0.5 mL Intramuscular Tomorrow-1000   polyethylene glycol  17 g Oral Daily   ranolazine  500 mg Oral BID   rifampin  300 mg Oral BID WC   rosuvastatin  40 mg Oral Daily   sodium chloride flush  10-40 mL Intracatheter Q12H   Continuous Infusions:  sodium chloride 250 mL (01/29/21 1103)   heparin 1,500 Units/hr (01/31/21 0607)   promethazine (PHENERGAN) injection  (IM or IVPB) Stopped (01/26/21 1902)   vancomycin 1,000 mg (01/31/21 1433)     LOS: 16 days   Time spent: 45 minutes with >50% on co   Nolberto Hanlon, MD [facility]  If 7PM-7AM, please contact night-coverage [email or web address]  01/31/2021, 3:22 PM

## 2021-01-31 NOTE — Progress Notes (Signed)
Progress Note  Patient Name: Monica [name] Date of Encounter: 01/31/2021  [facility] Cardiologist: Lorine Bears, MD   Subjective   Seen this morning eating breakfast, denies chest pain, shortness of breath or palpitations.  Not been able to do much, has not moved from bed to chair.  Inpatient Medications    Scheduled Meds:  aspirin  81 mg Oral Daily   Chlorhexidine Gluconate Cloth  6 each Topical Daily   clopidogrel  75 mg Oral Daily   docusate  100 mg Per Tube BID   feeding supplement  237 mL Oral TID BM   furosemide  40 mg Oral Daily   ipratropium-albuterol  3 mL Nebulization Q6H   lisinopril  2.5 mg Oral Daily   melatonin  5 mg Oral QHS   methylPREDNISolone (SOLU-MEDROL) injection  40 mg Intravenous Daily   metoprolol succinate  25 mg Oral Daily   multivitamin with minerals  1 tablet Oral Daily   pantoprazole (PROTONIX) IV  40 mg Intravenous Daily   pneumococcal 23 valent vaccine  0.5 mL Intramuscular Tomorrow-1000   polyethylene glycol  17 g Oral Daily   ranolazine  500 mg Oral BID   rifampin  300 mg Oral BID WC   rosuvastatin  40 mg Oral Daily   sodium chloride flush  10-40 mL Intracatheter Q12H   Continuous Infusions:  sodium chloride 250 mL (01/29/21 1103)   heparin 1,500 Units/hr (01/31/21 0607)   promethazine (PHENERGAN) injection (IM or IVPB) Stopped (01/26/21 1902)   vancomycin 1,000 mg (01/30/21 1504)   PRN Meds: bisacodyl, diazepam, ipratropium-albuterol, lactulose, lip balm, [DISCONTINUED] metoCLOPramide **OR** metoCLOPramide (REGLAN) injection, morphine injection, nitroGLYCERIN, [DISCONTINUED] promethazine **OR** promethazine (PHENERGAN) injection (IM or IVPB) **OR** promethazine, sodium chloride flush   Vital Signs    Vitals:   01/31/21 0400 01/31/21 0722 01/31/21 0806 01/31/21 1100  BP: 117/73  120/78 123/74  Pulse: 96  94 98  Resp: 20   20  Temp:   98 F (36.7 C) 98.2 F (36.8 C)  TempSrc:   Oral Oral  SpO2: 94% 93% 94% 91%  Weight:     97.4 kg  Height:        Intake/Output Summary (Last 24 hours) at 01/31/2021 1300 Last data filed at 01/31/2021 0740 Gross per 24 hour  Intake 894.78 ml  Output 210 ml  Net 684.78 ml   Last 3 Weights 01/31/2021 01/30/2021 01/28/2021  Weight (lbs) 214 lb 11.7 oz 217 lb 2.5 oz 224 lb 13.9 oz  Weight (kg) 97.4 kg 98.5 kg 102 kg      Telemetry    Sinus rhythm, heart rate 95- Personally Reviewed  ECG    No new tracing obtained  Physical Exam   GEN:  Well nourished, mild distress.  HEENT: Normal  CARDIAC: RRR, 3/6 systolic murmur  RESPIRATORY: Diminished breath sounds at bases  ABDOMEN: Soft, non-tender, non-distended  MUSCULOSKELETAL:   1+ edema; No deformity   SKIN: Warm and dry  NEUROLOGIC:  Alert and oriented x 3  PSYCHIATRIC:   Normal affect    Labs    High Sensitivity Troponin:   Recent Labs  Lab 01/20/21 1336 01/20/21 1515 01/29/21 1054 01/29/21 1241  TROPONINIHS 11 12 1,374* 1,349*      Chemistry Recent Labs  Lab 01/27/21 0528 01/28/21 0500 01/29/21 0455 01/30/21 0509 01/31/21 0757  NA 130* 135 130*  --   --   K 3.9 3.3* 3.7  --   --   CL 93* 98 93*  --   --  CO2 28 29 28   --   --   GLUCOSE 117* 122* 132*  --   --   BUN 29* 28* 26*  --   --   CREATININE 0.92 0.98 0.91  --  1.03*  CALCIUM 8.2* 8.3* 8.3*  --   --   PROT  --   --   --  6.4*  --   ALBUMIN  --   --   --  2.6*  --   AST  --   --   --  30  --   ALT  --   --   --  20  --   ALKPHOS  --   --   --  54  --   BILITOT  --   --   --  2.4*  --   GFRNONAA >60 57* >60  --  53*  ANIONGAP 9 8 9   --   --      Hematology Recent Labs  Lab 01/29/21 0455 01/30/21 0509 01/31/21 0404  WBC 9.5 7.9 9.1  RBC 4.87 4.37 4.52  HGB 11.6* 10.7* 10.9*  HCT 36.4 33.1* 33.9*  MCV 74.7* 75.7* 75.0*  MCH 23.8* 24.5* 24.1*  MCHC 31.9 32.3 32.2  RDW 20.8* 20.6* 21.0*  PLT 212 243 288    BNPNo results for input(s): BNP, PROBNP in the last 168 hours.   DDimer No results for input(s): DDIMER in the  last 168 hours.   Radiology    ECHOCARDIOGRAM COMPLETE  Result Date: 01/29/2021    ECHOCARDIOGRAM REPORT   Patient Name:   Monica [name] Date of Exam: 01/29/2021 Medical Rec #:  Reatha Armour      Height:       65.0 in Accession #:    01/31/2021     Weight:       224.9 lb Date of Birth:  [birth year]      BSA:          2.079 m Patient Age:    87 years       BP:           104/75 mmHg Patient Gender: F              HR:           110 bpm. Exam Location:  [facility] Procedure: Color Doppler and Cardiac Doppler Indications:     Chest pain R07.9  History:         Patient has prior history of Echocardiogram examinations, most                  recent 10/03/2020. CAD. Aortic valve stenosis.  Sonographer:     10-24[birth year]1972 RT Referring Phys:  [id number] [address] Lack [facility] Diagnosing Phys: Dois Davenport MD IMPRESSIONS  1. Left ventricular ejection fraction, by estimation, is 30 to 35%. The left ventricle has moderately decreased function. The left ventricle demonstrates regional wall motion abnormalities (see scoring diagram/findings for description). There is moderate left ventricular hypertrophy. Left ventricular diastolic parameters are indeterminate. There is severe hypokinesis of the left ventricular, mid-apical anteroseptal wall, anterior wall, anterolateral wall and apical segment.  2. Right ventricular systolic function is normal. The right ventricular size is mildly enlarged. There is moderately elevated pulmonary artery systolic pressure.  3. Left atrial size was mildly dilated.  4. The mitral valve is degenerative. Mild mitral valve regurgitation. Mild mitral stenosis. Moderate mitral annular calcification.  5. The aortic valve is tricuspid. There is moderate calcification of the aortic  valve. There is severe thickening of the aortic valve. Aortic valve regurgitation is trivial. Severe aortic valve stenosis. Aortic valve area, by VTI measures 0.54 cm. Aortic valve mean gradient measures 32.0 mmHg.  6. The inferior vena  cava is normal in size with greater than 50% respiratory variability, suggesting right atrial pressure of 3 mmHg. FINDINGS  Left Ventricle: Left ventricular ejection fraction, by estimation, is 30 to 35%. The left ventricle has moderately decreased function. The left ventricle demonstrates regional wall motion abnormalities. Severe hypokinesis of the left ventricular, mid-apical anteroseptal wall, anterior wall, anterolateral wall and apical segment. The left ventricular internal cavity size was normal in size. There is moderate left ventricular hypertrophy. Left ventricular diastolic parameters are indeterminate. Right Ventricle: The right ventricular size is mildly enlarged. No increase in right ventricular wall thickness. Right ventricular systolic function is normal. There is moderately elevated pulmonary artery systolic pressure. The tricuspid regurgitant velocity is 3.58 m/s, and with an assumed right atrial pressure of 3 mmHg, the estimated right ventricular systolic pressure is 54.3 mmHg. Left Atrium: Left atrial size was mildly dilated. Right Atrium: Right atrial size was normal in size. Pericardium: There is no evidence of pericardial effusion. Mitral Valve: The mitral valve is degenerative in appearance. There is mild thickening of the mitral valve leaflet(s). Moderate mitral annular calcification. Mild mitral valve regurgitation. Mild mitral valve stenosis. Tricuspid Valve: The tricuspid valve is grossly normal. Tricuspid valve regurgitation is trivial. Aortic Valve: The aortic valve is tricuspid. There is moderate calcification of the aortic valve. There is severe thickening of the aortic valve. Aortic valve regurgitation is trivial. Severe aortic stenosis is present. Aortic valve mean gradient measures 32.0 mmHg. Aortic valve peak gradient measures 45.1 mmHg. Aortic valve area, by VTI measures 0.54 cm. Pulmonic Valve: The pulmonic valve was grossly normal. Pulmonic valve regurgitation is not  visualized. No evidence of pulmonic stenosis. Aorta: The aortic root is normal in size and structure. Pulmonary Artery: The pulmonary artery is of normal size. Venous: The inferior vena cava is normal in size with greater than 50% respiratory variability, suggesting right atrial pressure of 3 mmHg. IAS/Shunts: The interatrial septum was not well visualized.  LEFT VENTRICLE PLAX 2D LVIDd:         3.81 cm  Diastology LVIDs:         2.83 cm  LV e' medial:    12.20 cm/s LV PW:         1.40 cm  LV E/e' medial:  7.1 LV IVS:        1.38 cm  LV e' lateral:   2.94 cm/s LVOT diam:     2.00 cm  LV E/e' lateral: 29.4 LV SV:         34 LV SV Index:   16 LVOT Area:     3.14 cm  RIGHT VENTRICLE RV Basal diam:  4.88 cm RV S prime:     10.40 cm/s TAPSE (M-mode): 2.2 cm LEFT ATRIUM           Index       RIGHT ATRIUM           Index LA diam:      3.70 cm 1.78 cm/m  RA Area:     11.60 cm 5.22 cm/m LA Vol (A4C): 77.5 ml 37.27 ml/m RA Volume:   25.40 ml  12.22 ml/m  AORTIC VALVE                    PULMONIC  VALVE AV Area (Vmax):    0.57 cm     PV Vmax:        0.87 m/s AV Area (Vmean):   0.55 cm     PV Peak grad:   3.1 mmHg AV Area (VTI):     0.54 cm     RVOT Peak grad: 3 mmHg AV Vmax:           335.67 cm/s AV Vmean:          253.000 cm/s AV VTI:            0.626 m AV Peak Grad:      45.1 mmHg AV Mean Grad:      32.0 mmHg LVOT Vmax:         61.40 cm/s LVOT Vmean:        44.100 cm/s LVOT VTI:          0.107 m LVOT/AV VTI ratio: 0.17  AORTA Ao Root diam: 3.60 cm MITRAL VALVE                TRICUSPID VALVE MV Area (PHT): 4.26 cm     TR Peak grad:   51.3 mmHg MV Area VTI:   1.23 cm     TR Vmax:        358.00 cm/s MV VTI:        0.27 m MV Decel Time: 178 msec     SHUNTS MV E velocity: 86.50 cm/s   Systemic VTI:  0.11 m MV A velocity: 143.00 cm/s  Systemic Diam: 2.00 cm MV E/A ratio:  0.60 Cristal Deer End MD Electronically signed by Yvonne Kendall MD Signature Date/Time: 01/29/2021/2:46:32 PM    Final     Cardiac Studies    01/29/2021 1. Left ventricular ejection fraction, by estimation, is 30 to 35%. The  left ventricle has moderately decreased function. The left ventricle  demonstrates regional wall motion abnormalities (see scoring  diagram/findings for description). There is  moderate left ventricular hypertrophy. Left ventricular diastolic  parameters are indeterminate. There is severe hypokinesis of the left  ventricular, mid-apical anteroseptal wall, anterior wall, anterolateral  wall and apical segment.   2. Right ventricular systolic function is normal. The right ventricular  size is mildly enlarged. There is moderately elevated pulmonary artery  systolic pressure.   3. Left atrial size was mildly dilated.   4. The mitral valve is degenerative. Mild mitral valve regurgitation.  Mild mitral stenosis. Moderate mitral annular calcification.   5. The aortic valve is tricuspid. There is moderate calcification of the  aortic valve. There is severe thickening of the aortic valve. Aortic valve  regurgitation is trivial. Severe aortic valve stenosis. Aortic valve area,  by VTI measures 0.54 cm.  Aortic valve mean gradient measures 32.0 mmHg.   6. The inferior vena cava is normal in size with greater than 50%  respiratory variability, suggesting right atrial pressure of 3 mmHg.   Patient Profile     87 y.o. female with history of CAD/PCI to RCA, severe aortic stenosis presenting with left hip pain, diagnosed with left hip abscess s/p I&D.  AAA noted on abdominal CT, s/p endovascular repair.  Hospital course complicated by newly found severely reduced ejection fraction, EF 30-35%.  Assessment & Plan    NSTEMI, history of CAD/PCI -Continue heparin drip x48 hours total(to complete today) -Medical management with aspirin, Plavix x12 months. -Continue Ranexa for antianginal benefit -Comorbidities, including oxygen requirement, hip abscess preventing invasive work-up.  HFrEF, EF 30 to  35% -Continue  Toprol-XL, start lisinopril 2.5 mg daily  3.  Severe AS -Outpatient TAVR consideration.  Likely not a great candidate.  4.  AAA, status post endovascular repair  Total encounter time 35 minutes  Greater than 50% was spent in counseling and coordination of care with the patient   Signed, Debbe OdeaBrian Agbor-Etang, MD  01/31/2021, 1:00 PM

## 2021-01-31 NOTE — Progress Notes (Signed)
Daily Progress Note   Patient Name: Monica [name]       Date: 01/31/2021 DOB: 02-14-1934  Age: 87 y.o. MRN#: [id number] Attending Physician: Lynn Ito, MD Primary Care Physician: Patient, No Pcp Per (Inactive) Admit Date: 01/15/2021  Reason for Consultation/Follow-up: Establishing goals of care  Subjective: In to see patient today, staff is working with her at this time.  She complains that she feels worse today, but she has now been transitioned from high flow cannula to a simple [place]. Will follow for needs.  Length of Stay: 16  Current Medications: Scheduled Meds:  . aspirin  81 mg Oral Daily  . Chlorhexidine Gluconate Cloth  6 each Topical Daily  . clopidogrel  75 mg Oral Daily  . docusate  100 mg Per Tube BID  . feeding supplement  237 mL Oral TID BM  . furosemide  40 mg Oral Daily  . ipratropium-albuterol  3 mL Nebulization Q6H  . lisinopril  2.5 mg Oral Daily  . melatonin  5 mg Oral QHS  . methylPREDNISolone (SOLU-MEDROL) injection  40 mg Intravenous Daily  . metoprolol succinate  25 mg Oral Daily  . multivitamin with minerals  1 tablet Oral Daily  . pantoprazole (PROTONIX) IV  40 mg Intravenous Daily  . pneumococcal 23 valent vaccine  0.5 mL Intramuscular Tomorrow-1000  . polyethylene glycol  17 g Oral Daily  . ranolazine  500 mg Oral BID  . rifampin  300 mg Oral BID WC  . rosuvastatin  40 mg Oral Daily  . sodium chloride flush  10-40 mL Intracatheter Q12H    Continuous Infusions: . sodium chloride 250 mL (01/29/21 1103)  . heparin 1,500 Units/hr (01/31/21 6063)  . promethazine (PHENERGAN) injection (IM or IVPB) Stopped (01/26/21 1902)  . vancomycin 1,000 mg (01/30/21 1504)    PRN Meds: bisacodyl, diazepam, ipratropium-albuterol, lactulose, lip balm, [DISCONTINUED]  metoCLOPramide **OR** metoCLOPramide (REGLAN) injection, morphine injection, nitroGLYCERIN, [DISCONTINUED] promethazine **OR** promethazine (PHENERGAN) injection (IM or IVPB) **OR** promethazine, sodium chloride flush  Physical Exam Pulmonary:     Comments: WOB noted.  Neurological:     Mental Status: She is alert.            Vital Signs: BP 123/74 (BP Location: Right Arm)   Pulse 98   Temp 98.2 F (36.8 C) (Oral)   Resp  20   Ht 5\' 5"  (1.651 m)   Wt 97.4 kg   SpO2 91%   BMI 35.73 kg/m  SpO2: SpO2: 91 % O2 Device: O2 Device: High Flow Nasal Cannula O2 Flow Rate: O2 Flow Rate (L/min): 9 L/min  Intake/output summary:  Intake/Output Summary (Last 24 hours) at 01/31/2021 1339 Last data filed at 01/31/2021 0740 Gross per 24 hour  Intake 894.78 ml  Output 210 ml  Net 684.78 ml   LBM: Last BM Date: 01/30/21 Baseline Weight: Weight: 106.6 kg Most recent weight: Weight: 97.4 kg    Patient Active Problem List   Diagnosis Date Noted  . Non-ST elevation (NSTEMI) myocardial infarction (HCC)   . Hypoxia   . Shortness of breath   . Pre-op evaluation   . Abdominal aortic aneurysm (AAA) without rupture (HCC)   . Obesity, Class III, BMI 40-49.9 (morbid obesity) (HCC)   . Abscess of left hip 01/15/2021  . Acute respiratory failure with hypoxia (HCC) 10/05/2020  . Severe aortic stenosis   . Closed comminuted intertrochanteric fracture of proximal end of left femur (HCC) 10/02/2020  . Aortic stenosis   . Obesity   . Cellulitis of left lower extremity 04/29/2018  . GI bleed 06/05/2017  . Nausea vomiting and diarrhea   . Acute ST elevation myocardial infarction (STEMI) of inferior wall (HCC) 06/02/2017  . CAD (coronary artery disease) 2018    Palliative Care Assessment & Plan     Recommendations/Plan: Continuing care. Transitioned to a simple [place] from high flow cannula.  Will follow along for needs.     Code Status:    Code Status Orders  (From admission, onward)            Start     Ordered   01/23/21 1543  Do not attempt resuscitation (DNR)  Continuous       Question Answer Comment  In the event of cardiac or respiratory ARREST Do not call a "code blue"   In the event of cardiac or respiratory ARREST Do not perform Intubation, CPR, defibrillation or ACLS   In the event of cardiac or respiratory ARREST Use medication by any route, position, wound care, and other measures to relive pain and suffering. May use oxygen, suction and manual treatment of airway obstruction as needed for comfort.   Comments MOST form on chart.      01/23/21 1542           Code Status History     Date Active Date Inactive Code Status Order ID Comments User Context   01/15/2021 2309 01/23/2021 1542 Full Code 01/25/2021  [id number] Arville Care, MD Inpatient   10/02/2020 1117 10/09/2020 2150 Full Code 2151  [id number], MD ED   04/30/2018 1018 05/02/2018 1808 DNR 05/04/2018  [id number], MD Inpatient   04/29/2018 1732 04/30/2018 1018 Full Code 05/02/2018  [id number], MD Inpatient   06/05/2017 0521 06/07/2017 1404 Full Code 06/09/2017  [id number], MD Inpatient   06/02/2017 0059 06/04/2017 1932 Full Code 06/06/2017  [id number], MD Inpatient      Advance Directive Documentation    Flowsheet Row Most Recent Value  Type of Advance Directive Healthcare Power of Attorney, Living will  Pre-existing out of facility DNR order (yellow form or pink MOST form) --  "MOST" Form in Place? --       Prognosis:  Poor overall   Thank you for allowing the Palliative Medicine Team to assist in the care of this patient.  Total Time 15 min Prolonged Time Billed  no       Greater than 50%  of this time was spent counseling and coordinating care related to the above assessment and plan.  Morton Stall, NP  Please contact Palliative Medicine Team phone at [phone] for questions and concerns.

## 2021-01-31 NOTE — Plan of Care (Signed)
  Problem: Clinical Measurements: Goal: Will remain free from infection Outcome: Progressing   Problem: Clinical Measurements: Goal: Cardiovascular complication will be avoided Outcome: Progressing   Problem: Pain Managment: Goal: General experience of comfort will improve Outcome: Progressing   Problem: Safety: Goal: Ability to remain free from injury will improve Outcome: Progressing   Problem: Clinical Measurements: Goal: Respiratory complications will improve Outcome: Progressing

## 2021-01-31 NOTE — Consult Note (Signed)
ANTICOAGULATION CONSULT NOTE   Pharmacy Consult for Heparin Indication: chest pain/ACS  No Known Allergies  Patient Measurements: Height: 5\' 5"  (165.1 cm) Weight: 98.5 kg (217 lb 2.5 oz) IBW/kg (Calculated) : 57 Heparin Dosing Weight: 80.5 kg   Vital Signs: Temp: 98.4 F (36.9 C) (06/21 2146) BP: 116/75 (06/21 2146) Pulse Rate: 94 (06/22 0000)  Labs: Recent Labs    01/28/21 0500 01/29/21 0455 01/29/21 1054 01/29/21 1241 01/29/21 1336 01/29/21 2120 01/30/21 0509 01/30/21 1410 01/30/21 2229  HGB 11.0* 11.6*  --   --   --   --  10.7*  --   --   HCT 34.8* 36.4  --   --   --   --  33.1*  --   --   PLT 175 212  --   --   --   --  243  --   --   APTT  --   --   --   --  37*  --   --   --   --   LABPROT  --   --   --   --  17.4*  --   --   --   --   INR  --   --   --   --  1.4*  --   --   --   --   HEPARINUNFRC  --   --   --   --   --    < > 0.15* 0.34 0.28*  CREATININE 0.98 0.91  --   --   --   --   --   --   --   TROPONINIHS  --   --  1,374* 1,349*  --   --   --   --   --    < > = values in this interval not displayed.     Estimated Creatinine Clearance: 52.5 mL/min (by C-G formula based on SCr of 0.91 mg/dL).   Medical History: Past Medical History:  Diagnosis Date   Aortic stenosis    a. LHC 06/02/17: At least moderate aortic stenosis with a peak to peak gradient of 22 mmHg; b. TTE 05/2017: EF 55-60%, mild HK basal-midinferior wall, Gr1DD, mod to sev AS w/ mean gradient 21 mmHg, valve area 0.99, mild MR, mildly dilated LA    CAD (coronary artery disease) 2018   a. inferior STEMI 06/02/2017: LHC 06/02/17: LM 20, D1 20%, o-pLCx 50, p-mRCA 100% s/p PCI/DES, mRCA 50, dRCA 30   GI bleed    a. noted 06/05/2017   HLD (hyperlipidemia)    Obesity     Medications:  Medications Prior to Admission  Medication Sig Dispense Refill Last Dose   aspirin 81 MG EC tablet Take 81 mg by mouth daily.   Past Week   carvedilol (COREG) 3.125 MG tablet Take 3.125 mg by mouth 2 (two)  times daily with a meal.   Past Week at unknown   oxyCODONE (OXY IR/ROXICODONE) 5 MG immediate release tablet Take 5 mg by mouth every 4 (four) hours as needed for severe pain.   prn at prn   rosuvastatin (CRESTOR) 10 MG tablet Take 1 tablet by mouth once daily (Patient taking differently: Take 10 mg by mouth daily.) 90 tablet 0 Past Week   [DISCONTINUED] enoxaparin (LOVENOX) 40 MG/0.4ML injection Inject 0.4 mLs (40 mg total) into the skin daily for 14 days. (Patient not taking: Reported on 01/22/2021) 5.6 mL 0 Not Taking   Scheduled:  aspirin  81 mg Oral Daily   Chlorhexidine Gluconate Cloth  6 each Topical Daily   clopidogrel  75 mg Oral Daily   docusate  100 mg Per Tube BID   feeding supplement  237 mL Oral TID BM   furosemide  40 mg Oral Daily   ipratropium-albuterol  3 mL Nebulization Q6H   melatonin  5 mg Oral QHS   methylPREDNISolone (SOLU-MEDROL) injection  40 mg Intravenous Daily   metoprolol tartrate  25 mg Oral BID   multivitamin with minerals  1 tablet Oral Daily   pantoprazole (PROTONIX) IV  40 mg Intravenous Daily   pneumococcal 23 valent vaccine  0.5 mL Intramuscular Tomorrow-1000   polyethylene glycol  17 g Oral Daily   ranolazine  500 mg Oral BID   rifampin  300 mg Oral BID WC   rosuvastatin  40 mg Oral Daily   sodium chloride flush  10-40 mL Intracatheter Q12H   Infusions:   sodium chloride 250 mL (01/29/21 1103)   heparin 1,500 Units/hr (01/31/21 0013)   promethazine (PHENERGAN) injection (IM or IVPB) Stopped (01/26/21 1902)   vancomycin 1,000 mg (01/30/21 1504)   PRN: bisacodyl, diazepam, ipratropium-albuterol, lactulose, lip balm, [DISCONTINUED] metoCLOPramide **OR** metoCLOPramide (REGLAN) injection, morphine injection, nitroGLYCERIN, [DISCONTINUED] promethazine **OR** promethazine (PHENERGAN) injection (IM or IVPB) **OR** promethazine, sodium chloride flush  Assessment: Pharmacy consulted to start heparin for ACS. No DOAC PTA. Trop 1374.     6/21 0509 0.15  heparin 2400 units x 1 and increase infusion to 1300 units/hr.  6/21 1410 0.34  6/21 2249 0.28   Goal of Therapy:  Heparin level 0.3-0.7 units/ml Monitor platelets by anticoagulation protocol: Yes   Plan:  6/21:  HL @ 2229 = 0.28 Will order Heparin 1200 units IV X 1 bolus and increase drip rate to 1500 units/hr.  Will recheck HL 8 hrs after rate change.   Leana Springston D, PharmD 01/31/2021,1:15 AM

## 2021-01-31 NOTE — Consult Note (Signed)
ANTICOAGULATION CONSULT NOTE   Pharmacy Consult for Heparin Indication: chest pain/ACS  No Known Allergies  Patient Measurements: Height: 5\' 5"  (165.1 cm) Weight: 97.4 kg (214 lb 11.7 oz) IBW/kg (Calculated) : 57 Heparin Dosing Weight: 80.5 kg   Vital Signs: Temp: 98.1 F (36.7 C) (06/22 1555) Temp Source: Oral (06/22 1555) BP: 123/75 (06/22 1555) Pulse Rate: 96 (06/22 1555)  Labs: Recent Labs    01/29/21 0455 01/29/21 1054 01/29/21 1241 01/29/21 1336 01/29/21 2120 01/30/21 0509 01/30/21 1410 01/30/21 2229 01/31/21 0404 01/31/21 0757 01/31/21 1618  HGB 11.6*  --   --   --   --  10.7*  --   --  10.9*  --   --   HCT 36.4  --   --   --   --  33.1*  --   --  33.9*  --   --   PLT 212  --   --   --   --  243  --   --  288  --   --   APTT  --   --   --  37*  --   --   --   --   --   --   --   LABPROT  --   --   --  17.4*  --   --   --   --   --   --   --   INR  --   --   --  1.4*  --   --   --   --   --   --   --   HEPARINUNFRC  --   --   --   --    < > 0.15*   < > 0.28*  --  0.49 0.52  CREATININE 0.91  --   --   --   --   --   --   --   --  1.03*  --   TROPONINIHS  --  1,374* 1,349*  --   --   --   --   --   --   --   --    < > = values in this interval not displayed.     Estimated Creatinine Clearance: 46.1 mL/min (A) (by C-G formula based on SCr of 1.03 mg/dL (H)).   Medical History: Past Medical History:  Diagnosis Date   Aortic stenosis    a. LHC 06/02/17: At least moderate aortic stenosis with a peak to peak gradient of 22 mmHg; b. TTE 05/2017: EF 55-60%, mild HK basal-midinferior wall, Gr1DD, mod to sev AS w/ mean gradient 21 mmHg, valve area 0.99, mild MR, mildly dilated LA    CAD (coronary artery disease) 2018   a. inferior STEMI 06/02/2017: LHC 06/02/17: LM 20, D1 20%, o-pLCx 50, p-mRCA 100% s/p PCI/DES, mRCA 50, dRCA 30   GI bleed    a. noted 06/05/2017   HLD (hyperlipidemia)    Obesity     Medications:  Medications Prior to Admission  Medication  Sig Dispense Refill Last Dose   aspirin 81 MG EC tablet Take 81 mg by mouth daily.   Past Week   carvedilol (COREG) 3.125 MG tablet Take 3.125 mg by mouth 2 (two) times daily with a meal.   Past Week at unknown   oxyCODONE (OXY IR/ROXICODONE) 5 MG immediate release tablet Take 5 mg by mouth every 4 (four) hours as needed for severe pain.   prn at prn  rosuvastatin (CRESTOR) 10 MG tablet Take 1 tablet by mouth once daily (Patient taking differently: Take 10 mg by mouth daily.) 90 tablet 0 Past Week   [DISCONTINUED] enoxaparin (LOVENOX) 40 MG/0.4ML injection Inject 0.4 mLs (40 mg total) into the skin daily for 14 days. (Patient not taking: Reported on 01/22/2021) 5.6 mL 0 Not Taking   Scheduled:   aspirin  81 mg Oral Daily   Chlorhexidine Gluconate Cloth  6 each Topical Daily   clopidogrel  75 mg Oral Daily   docusate  100 mg Per Tube BID   feeding supplement  237 mL Oral TID BM   furosemide  40 mg Oral Daily   ipratropium-albuterol  3 mL Nebulization Q6H   lisinopril  2.5 mg Oral Daily   melatonin  5 mg Oral QHS   methylPREDNISolone (SOLU-MEDROL) injection  40 mg Intravenous Daily   metoprolol succinate  25 mg Oral Daily   multivitamin with minerals  1 tablet Oral Daily   pantoprazole (PROTONIX) IV  40 mg Intravenous Daily   pneumococcal 23 valent vaccine  0.5 mL Intramuscular Tomorrow-1000   polyethylene glycol  17 g Oral Daily   ranolazine  500 mg Oral BID   rifampin  300 mg Oral BID WC   rosuvastatin  40 mg Oral Daily   sodium chloride flush  10-40 mL Intracatheter Q12H   Infusions:   sodium chloride 250 mL (01/29/21 1103)   heparin 1,500 Units/hr (01/31/21 1604)   promethazine (PHENERGAN) injection (IM or IVPB) Stopped (01/26/21 1902)   vancomycin 1,000 mg (01/31/21 1433)   PRN: bisacodyl, diazepam, ipratropium-albuterol, lactulose, lip balm, [DISCONTINUED] metoCLOPramide **OR** metoCLOPramide (REGLAN) injection, morphine injection, nitroGLYCERIN, [DISCONTINUED] promethazine  **OR** promethazine (PHENERGAN) injection (IM or IVPB) **OR** promethazine, sodium chloride flush  Assessment: Pharmacy consulted to start heparin for ACS. No DOAC PTA. Trop 1374.     6/21 0509 0.15 heparin 2400 units x 1 and increase infusion to 1300 units/hr.  6/21 1410 0.34  6/21 2249 0.28 Heparin 1200 units IV X 1 bolus and increase drip rate to 1500 units/hr.  6/22 0757 0.49 6/22 1618 0.52   Goal of Therapy:  Heparin level 0.3-0.7 units/ml Monitor platelets by anticoagulation protocol: Yes   Plan:  Heparin level is therapeutic. Will continue heparin infusion at 1500 units/hr. CBC daily while on heparin. Plan to continue heparin for 48 hours per cards.   Sharen Hones, PharmD, BCPS 01/31/2021,5:01 PM

## 2021-01-31 NOTE — Progress Notes (Signed)
PT Cancellation Note  Patient Details Name: Monica [name] MRN: [id number] DOB: Mar 09, 1934   Cancelled Treatment:     PT attempt. Pt refused. Max encouragement for participation however pt unwilling." I just can't today." Asked why she can't today and she requested [name] return this time tomorrow. Acute PT will continue to follow and progress as able per pt tolerance.    Rushie Chestnut 01/31/2021, 1:34 PM

## 2021-01-31 NOTE — Consult Note (Signed)
ANTICOAGULATION CONSULT NOTE   Pharmacy Consult for Heparin Indication: chest pain/ACS  No Known Allergies  Patient Measurements: Height: 5\' 5"  (165.1 cm) Weight: 98.5 kg (217 lb 2.5 oz) IBW/kg (Calculated) : 57 Heparin Dosing Weight: 80.5 kg   Vital Signs: Temp: 98 F (36.7 C) (06/22 0806) Temp Source: Oral (06/22 0806) BP: 120/78 (06/22 0806) Pulse Rate: 94 (06/22 0806)  Labs: Recent Labs    01/29/21 0455 01/29/21 1054 01/29/21 1241 01/29/21 1336 01/29/21 2120 01/30/21 0509 01/30/21 1410 01/30/21 2229 01/31/21 0404 01/31/21 0757  HGB 11.6*  --   --   --   --  10.7*  --   --  10.9*  --   HCT 36.4  --   --   --   --  33.1*  --   --  33.9*  --   PLT 212  --   --   --   --  243  --   --  288  --   APTT  --   --   --  37*  --   --   --   --   --   --   LABPROT  --   --   --  17.4*  --   --   --   --   --   --   INR  --   --   --  1.4*  --   --   --   --   --   --   HEPARINUNFRC  --   --   --   --    < > 0.15* 0.34 0.28*  --  0.49  CREATININE 0.91  --   --   --   --   --   --   --   --   --   TROPONINIHS  --  1,374* 1,349*  --   --   --   --   --   --   --    < > = values in this interval not displayed.     Estimated Creatinine Clearance: 52.5 mL/min (by C-G formula based on SCr of 0.91 mg/dL).   Medical History: Past Medical History:  Diagnosis Date   Aortic stenosis    a. LHC 06/02/17: At least moderate aortic stenosis with a peak to peak gradient of 22 mmHg; b. TTE 05/2017: EF 55-60%, mild HK basal-midinferior wall, Gr1DD, mod to sev AS w/ mean gradient 21 mmHg, valve area 0.99, mild MR, mildly dilated LA    CAD (coronary artery disease) 2018   a. inferior STEMI 06/02/2017: LHC 06/02/17: LM 20, D1 20%, o-pLCx 50, p-mRCA 100% s/p PCI/DES, mRCA 50, dRCA 30   GI bleed    a. noted 06/05/2017   HLD (hyperlipidemia)    Obesity     Medications:  Medications Prior to Admission  Medication Sig Dispense Refill Last Dose   aspirin 81 MG EC tablet Take 81 mg by  mouth daily.   Past Week   carvedilol (COREG) 3.125 MG tablet Take 3.125 mg by mouth 2 (two) times daily with a meal.   Past Week at unknown   oxyCODONE (OXY IR/ROXICODONE) 5 MG immediate release tablet Take 5 mg by mouth every 4 (four) hours as needed for severe pain.   prn at prn   rosuvastatin (CRESTOR) 10 MG tablet Take 1 tablet by mouth once daily (Patient taking differently: Take 10 mg by mouth daily.) 90 tablet 0 Past Week   [  DISCONTINUED] enoxaparin (LOVENOX) 40 MG/0.4ML injection Inject 0.4 mLs (40 mg total) into the skin daily for 14 days. (Patient not taking: Reported on 01/22/2021) 5.6 mL 0 Not Taking   Scheduled:   aspirin  81 mg Oral Daily   Chlorhexidine Gluconate Cloth  6 each Topical Daily   clopidogrel  75 mg Oral Daily   docusate  100 mg Per Tube BID   feeding supplement  237 mL Oral TID BM   furosemide  40 mg Oral Daily   ipratropium-albuterol  3 mL Nebulization Q6H   lisinopril  2.5 mg Oral Daily   melatonin  5 mg Oral QHS   methylPREDNISolone (SOLU-MEDROL) injection  40 mg Intravenous Daily   metoprolol succinate  25 mg Oral Daily   multivitamin with minerals  1 tablet Oral Daily   pantoprazole (PROTONIX) IV  40 mg Intravenous Daily   pneumococcal 23 valent vaccine  0.5 mL Intramuscular Tomorrow-1000   polyethylene glycol  17 g Oral Daily   ranolazine  500 mg Oral BID   rifampin  300 mg Oral BID WC   rosuvastatin  40 mg Oral Daily   sodium chloride flush  10-40 mL Intracatheter Q12H   Infusions:   sodium chloride 250 mL (01/29/21 1103)   heparin 1,500 Units/hr (01/31/21 0607)   promethazine (PHENERGAN) injection (IM or IVPB) Stopped (01/26/21 1902)   vancomycin 1,000 mg (01/30/21 1504)   PRN: bisacodyl, diazepam, ipratropium-albuterol, lactulose, lip balm, [DISCONTINUED] metoCLOPramide **OR** metoCLOPramide (REGLAN) injection, morphine injection, nitroGLYCERIN, [DISCONTINUED] promethazine **OR** promethazine (PHENERGAN) injection (IM or IVPB) **OR** promethazine,  sodium chloride flush  Assessment: Pharmacy consulted to start heparin for ACS. No DOAC PTA. Trop 1374.     6/21 0509 0.15 heparin 2400 units x 1 and increase infusion to 1300 units/hr.  6/21 1410 0.34  6/21 2249 0.28 Heparin 1200 units IV X 1 bolus and increase drip rate to 1500 units/hr.  6/22 0757 0.49  Goal of Therapy:  Heparin level 0.3-0.7 units/ml Monitor platelets by anticoagulation protocol: Yes   Plan:  Heparin level is therapeutic. Will continue heparin infusion at 1500 units/hr. CBC daily while on heparin. Plan to continue heparin for 48 hours per cards.   Ronnald Ramp, PharmD 01/31/2021,8:55 AM

## 2021-02-01 ENCOUNTER — Encounter: Payer: Self-pay | Admitting: Family Medicine

## 2021-02-01 DIAGNOSIS — I420 Dilated cardiomyopathy: Secondary | ICD-10-CM

## 2021-02-01 DIAGNOSIS — L0291 Cutaneous abscess, unspecified: Secondary | ICD-10-CM

## 2021-02-01 LAB — CBC
HCT: 32.5 % — ABNORMAL LOW (ref 36.0–46.0)
Hemoglobin: 10.4 g/dL — ABNORMAL LOW (ref 12.0–15.0)
MCH: 24.6 pg — ABNORMAL LOW (ref 26.0–34.0)
MCHC: 32 g/dL (ref 30.0–36.0)
MCV: 76.8 fL — ABNORMAL LOW (ref 80.0–100.0)
Platelets: 272 10*3/uL (ref 150–400)
RBC: 4.23 MIL/uL (ref 3.87–5.11)
RDW: 21.7 % — ABNORMAL HIGH (ref 11.5–15.5)
WBC: 8.1 10*3/uL (ref 4.0–10.5)
nRBC: 0 % (ref 0.0–0.2)

## 2021-02-01 LAB — CREATININE, SERUM
Creatinine, Ser: 1.02 mg/dL — ABNORMAL HIGH (ref 0.44–1.00)
GFR, Estimated: 54 mL/min — ABNORMAL LOW (ref >60)

## 2021-02-01 LAB — HEPARIN LEVEL (UNFRACTIONATED): Heparin Unfractionated: <0.1 IU/mL — ABNORMAL LOW (ref 0.30–0.70)

## 2021-02-01 LAB — VANCOMYCIN, TROUGH: Vancomycin Tr: 20 ug/mL (ref 15–20)

## 2021-02-01 MED ORDER — TORSEMIDE 20 MG PO TABS
40.0000 mg | ORAL_TABLET | Freq: Two times a day (BID) | ORAL | Status: DC
  Administered 2021-02-01 – 2021-02-02 (×4): 40 mg via ORAL
  Filled 2021-02-01 (×4): qty 2

## 2021-02-01 MED ORDER — ENOXAPARIN SODIUM 40 MG/0.4ML IJ SOSY: 40.0000 mg | PREFILLED_SYRINGE | INTRAMUSCULAR | Status: DC

## 2021-02-01 MED ORDER — VANCOMYCIN HCL 750 MG/150ML IV SOLN
750.0000 mg | INTRAVENOUS | Status: DC
  Administered 2021-02-01: 750 mg via INTRAVENOUS
  Filled 2021-02-01 (×2): qty 150

## 2021-02-01 MED ORDER — ROSUVASTATIN CALCIUM 10 MG PO TABS
40.0000 mg | ORAL_TABLET | Freq: Every day | ORAL | Status: DC
  Administered 2021-02-01 – 2021-02-10 (×10): 40 mg via ORAL
  Filled 2021-02-01 (×10): qty 4

## 2021-02-01 MED ORDER — ENOXAPARIN SODIUM 60 MG/0.6ML IJ SOSY
0.5000 mg/kg | PREFILLED_SYRINGE | INTRAMUSCULAR | Status: DC
  Administered 2021-02-01 – 2021-02-09 (×9): 50 mg via SUBCUTANEOUS
  Filled 2021-02-01 (×10): qty 0.5

## 2021-02-01 MED ORDER — IPRATROPIUM-ALBUTEROL 0.5-2.5 (3) MG/3ML IN SOLN
3.0000 mL | Freq: Three times a day (TID) | RESPIRATORY_TRACT | Status: DC
  Administered 2021-02-01 – 2021-02-08 (×22): 3 mL via RESPIRATORY_TRACT
  Filled 2021-02-01 (×21): qty 3

## 2021-02-01 MED ORDER — FUROSEMIDE 10 MG/ML IJ SOLN: 20.0000 mg | Freq: Two times a day (BID) | INTRAMUSCULAR | Status: DC

## 2021-02-01 NOTE — Progress Notes (Signed)
PROGRESS NOTE    Monica [name]  PHX:[id number] DOB: 10/25/1933 DOA: 01/15/2021 PCP: Patient, No Pcp Per (Inactive)   Brief Narrative:  Monica [name] is a 87 y.o. F with CAD s/p PCI x1 in 2018, severe AS being worked up for TAVR, Obesity, pHTN, and COPD who presented with hip pain.   Had hip fracture after fall in Feb, repaired and discharged home.  Since being home 4 months ago, really never able to walk due to pain. Finally, in the last week before admission, developed redness and worse pain at left hip surgical site, then wound dehisced.   Presented to the ER and Orthopedics were consulted for suspected post-surgical hip infection with MRSA.  ID is recommending 4 weeks of vancomycin and rifampin.    Also noted incidentally to have very large AAA, she was taken to the OR for endovascular repair with vascular surgery today.  Also had severe aortic stenosis and plan is for TAVR work-up after AAA repair, most likely as an outpatient if able to get out of hospital from current admission.  She will not be a good candidate due to multiple comorbidities.  Patient with multiple life-threatening comorbidities-high risk for deterioration and death, palliative care was also consulted, patient is now on DNR/DNI.  She will consider comfort care if deteriorates. At this time we will continue current level of care.  Patient had her endovascular AAA repair done with vascular surgery on 01/24/2021, she remained intubated and in ICU for 2 days after that, extubated yesterday and we will resume care again from today.  Remained on heated high flow, weak cough and appears very lethargic.  6/18: Appears more lethargic with worsening oxygen requirement, today at 50 L and 65% FiO2  6/19: Little improvement in FiO2, started taking few sips here and there.  6/20: Patient remains on heated high flow at 55 L with 50% FiO2.  Developed chest pain which he responded to nitroglycerin.  Troponin elevated at 1374, EKG with  nonspecific T wave changes.  Concern of NSTEMI.  Cardiology was reconsulted, they are recommending conservative management.  She was started on heparin infusion after consulting vascular surgery as she recently had her endovascular AAA repair.  6/21: Echocardiogram with newly reduced EF to 30 to 35% with regional wall motion abnormalities consistent with recent anterior MI.  Not a candidate for cardiac catheterization.  Medical management per cardiology.  Remains on high level of oxygen with heated high flow.  Palliative care again met her but she wants to keep pushing, prognosis is very guarded due to multiple life limiting comorbidities.  Very high risk for deterioration and death.  6/22-spoke to daughter via phone today discussed patient's prognosis.  Still on 9 L high flow.  Today patient was not feeling well and felt she was still short of breath.  After much discussion with daughter, she is agreeable to home with hospice.  We have consulted hospice to evaluate patient.  6/23 when I went to see patient she was on 9 L high flow nasal cannula satting 86%, I increase it to 13 with 02 sat 93%.  She reports less short of breath but talking to her she appears to be still short of breath.  Subjective: No abdominal pain, chest pain   Assessment & Plan:   Active Problems:   Abscess of left hip   Abdominal aortic aneurysm (AAA) without rupture (HCC)   Obesity, Class III, BMI 40-49.9 (morbid obesity) (HCC)   Pre-op evaluation   Shortness of breath  Hypoxia   Non-ST elevation (NSTEMI) myocardial infarction ([place])  Chest pain/NSTEMI.   Concern of NSTEMI with markedly elevated troponin.  Chest pain responded to nitroglycerin.  History of CAD with PCI to RCA.  Echocardiogram with new onset of reduced EF with regional wall motion abnormalities consistent with recent anterior MI. Cardiology was reconsulted and they are recommending conservative management. Cardiology started her on heparin infusion  after consulting vascular surgery. 6/22 she will need medical management with aspirin, Plavix x12 months 6/23 continue Ranexa for antianginal benefit  Completed heparin drip x48 hours  Continue beta-blockers and ACE inhibitors  Hospice to evaluate patient       Postsurgical hip infection S/p debridement of the left hip wound by Dr. Posey Pronto, 6/7 Admitted and taken to OR 6/7 for washout.  Intraoperative cultures growing MRSA in 3/3. ID consulted, recommended treating with 4 weeks antibiotics for presumed MRSA prosthetic infection.  1/2 blood cultures positive for staph epidermidis which was thought to be a contaminant. -Orthopedic remove the suture and drain today-no obvious sign of infection according to their note. 6/23 continue rifampin and vancomycin day 17 of 28      Lower abdominal pain.  Operative sites looks okay.   Possibly due to constipation.  Appears to be resolved.  Continue bowel regimen   Severe aortic stenosis Previously known, since hip fracture in Feb.  Was pending TAVR eval prior to this admission, seen by Cardiology here. 6/23 no plans to start TAVR will work-up while hospitalized.  Unsure if she will be a candidate due to her debility and likely prolong recovery if that was possible.  Currently prognosis is poor.  Family deciding on hospice and hospice to visit family today for discussion   Chronic DHF-Lasix switched to torsemide.  Continue beta-blockers    Hypotension due to aortic stenosis Continue midodrine  Chronic DHF- on lasix and beta blk  Acute on chronic hypoxic respiratory failure.  Mild improvement in FiO2 requirement today.  Otherwise remained on heated high flow at 45 L and 50% FiO2. Has chronic hypoxic respiratory failure due to aortic stenosis, heart failure, and pulmonary hypertension.  COPD is in chart but appears to be mild. Former smoker.  Uses 2 L of oxygen at baseline.  Initially requiring 2 to 3 L and then got worse requiring up to 10  L starting from 6/12. Chest x-ray unremarkable, CT angiogram of the chest with patchy subpleural fibrosis, no PE, moderate atelectasis, no edema or large consolidation, no effusion. Patient required prolonged intubation after the procedure, extubated but continued to require very high levels of oxygen. - Consult to pulmonogy-appreciate their help - Continue Solumedrol BID for NSIP   6/23 today requiring more than 9 L high flow nasal cannula as she was satting 86% on 9 L.  BNP elevated.   Torsemide per cardiology.     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  Patient was evaluated by vascular surgery, who recommended urgent repair given the size.  She is not a candidate for open repair.  She was taken to the OR for endovascular repair-tolerated the procedure well, stable from vascular standpoint and will follow-up as an outpatient. -Appreciate vascular help.   Iron deficiency anemia Hgb stable Given Feraheme 6/10 -Transfusion threshold 8 g/dL  Hyponatremia.  Resolved Mild, asymptomatic  Vaginal candidiasis Given Fluconazole 1 dose on 6/10.   Left renal nodule Incidental finding, minor, given gravity of  AAA and AS and hypoxia, this likely has not been discussed with patient and should be followed up only depending on goals of care after AAA repair - Follow-up MRI as an outpatient  Class II obesity. Estimated body mass index is 36.36 kg/m as calculated from the following:   Height as of this encounter: 5' 5"  (1.651 m).   Weight as of this encounter: 99.1 kg.   Goals of care.hospice to evaluate patient for home hospice versus hospice house  Objective: Vitals:   02/01/21 0800 02/01/21 1000 02/01/21 1200 02/01/21 1257  BP: 125/73  107/65   Pulse: 92 93 92   Resp: (!) 21 (!) 21 20   Temp: 97.9 F (36.6 C)  97.9 F (36.6 C)   TempSrc: Oral  Oral   SpO2: 97% 90% (!) 87% 97%   Weight:      Height:        Intake/Output Summary (Last 24 hours) at 02/01/2021 1529 Last data filed at 02/01/2021 1400 Gross per 24 hour  Intake 761.14 ml  Output 1900 ml  Net -1138.86 ml   Filed Weights   01/30/21 0500 01/31/21 1100 02/01/21 0649  Weight: 98.5 kg 97.4 kg 99.1 kg    Examination: Appears tired, mildly short of breath Decreased breath sounds, no wheezing Regular S1-S2 no gallops Soft benign positive bowel sounds Positive edema bilaterally Awake and alert and oriented x4 Mood and affect appropriate in current setting   DVT prophylaxis: Heparin Code Status: DNR Family Communication: Son updated via phone Disposition Plan:  Status is: Inpatient  Remains inpatient appropriate because:Inpatient level of care appropriate due to severity of illness  Dispo: The patient is from:  Home              Anticipated d/c is to:  To be determined              Patient currently is not medically stable to d/c.   Difficult to place patient No                Level of care: Progressive Cardiac  Consultants:  Vascular surgery Pulmonology Cardiology  Procedures:  Antimicrobials:  Vancomycin Rifampin  Data Reviewed: I have personally reviewed following labs and imaging studies  CBC: Recent Labs  Lab 01/28/21 0500 01/29/21 0455 01/30/21 0509 01/31/21 0404 02/01/21 0630  WBC 8.8 9.5 7.9 9.1 8.1  HGB 11.0* 11.6* 10.7* 10.9* 10.4*  HCT 34.8* 36.4 33.1* 33.9* 32.5*  MCV 77.0* 74.7* 75.7* 75.0* 76.8*  PLT 175 212 243 288 277   Basic Metabolic Panel: Recent Labs  Lab 01/26/21 0917 01/27/21 0528 01/28/21 0500 01/29/21 0455 01/31/21 0757 02/01/21 0630  NA 136 130* 135 130*  --   --   K 3.9 3.9 3.3* 3.7  --   --   CL 96* 93* 98 93*  --   --   CO2 29 28 29 28   --   --   GLUCOSE 130* 117* 122* 132*  --   --   BUN 37* 29* 28* 26*  --   --   CREATININE 1.16* 0.92 0.98 0.91 1.03* 1.02*  CALCIUM 8.5* 8.2* 8.3* 8.3*  --   --   MG  --  2.4 2.2 2.0  --   --    PHOS  --  2.8 2.3* 2.5  --   --    GFR: Estimated Creatinine Clearance: 47 mL/min (A) (by C-G formula based on SCr of 1.02 mg/dL (H)). Liver Function Tests: Recent  Labs  Lab 01/30/21 0509  AST 30  ALT 20  ALKPHOS 54  BILITOT 2.4*  PROT 6.4*  ALBUMIN 2.6*   No results for input(s): LIPASE, AMYLASE in the last 168 hours. No results for input(s): AMMONIA in the last 168 hours. Coagulation Profile: Recent Labs  Lab 01/29/21 1336  INR 1.4*   Cardiac Enzymes: No results for input(s): CKTOTAL, CKMB, CKMBINDEX, TROPONINI in the last 168 hours. BNP (last 3 results) No results for input(s): PROBNP in the last 8760 hours. HbA1C: No results for input(s): HGBA1C in the last 72 hours. CBG: No results for input(s): GLUCAP in the last 168 hours. Lipid Profile: No results for input(s): CHOL, HDL, LDLCALC, TRIG, CHOLHDL, LDLDIRECT in the last 72 hours. Thyroid Function Tests: No results for input(s): TSH, T4TOTAL, FREET4, T3FREE, THYROIDAB in the last 72 hours. Anemia Panel: No results for input(s): VITAMINB12, FOLATE, FERRITIN, TIBC, IRON, RETICCTPCT in the last 72 hours. Sepsis Labs: No results for input(s): PROCALCITON, LATICACIDVEN in the last 168 hours.  No results found for this or any previous visit (from the past 240 hour(s)).    Radiology Studies: No results found.  Scheduled Meds:  aspirin  81 mg Oral Daily   Chlorhexidine Gluconate Cloth  6 each Topical Daily   clopidogrel  75 mg Oral Daily   docusate  100 mg Per Tube BID   enoxaparin (LOVENOX) injection  0.5 mg/kg Subcutaneous Q24H   feeding supplement  237 mL Oral TID BM   ipratropium-albuterol  3 mL Nebulization TID   lisinopril  2.5 mg Oral Daily   melatonin  5 mg Oral QHS   methylPREDNISolone (SOLU-MEDROL) injection  40 mg Intravenous Daily   metoprolol succinate  25 mg Oral Daily   multivitamin with minerals  1 tablet Oral Daily   pantoprazole (PROTONIX) IV  40 mg Intravenous Daily   pneumococcal 23 valent  vaccine  0.5 mL Intramuscular Tomorrow-1000   polyethylene glycol  17 g Oral Daily   ranolazine  500 mg Oral BID   rifampin  300 mg Oral BID WC   rosuvastatin  40 mg Oral Daily   sodium chloride flush  10-40 mL Intracatheter Q12H   torsemide  40 mg Oral BID   Continuous Infusions:  sodium chloride 250 mL (01/29/21 1103)   promethazine (PHENERGAN) injection (IM or IVPB) Stopped (01/26/21 1902)   vancomycin 750 mg (02/01/21 1511)     LOS: 17 days   Time spent:  35 minutes with >50% on co   Nolberto Hanlon, MD [facility]  If 7PM-7AM, please contact night-coverage [email or web address]  02/01/2021, 3:29 PM

## 2021-02-01 NOTE — Progress Notes (Signed)
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  [name] was at the bedside but had to leave to return to work. I was able to reach him via telephone to discuss discharge plans.  He confirms that he would like to honor his mothers wishes to take her home for EOL care. He also endorses that he is surprised with her sudden decline and the family is somewhat shocked. [name] states that although he wants to honor her wishes, he is having doubts about the families abilities to care for her in her current state.   Described what hospice at home would look like, but the majority of the care would fall on [name] and his family to provide her care.   [name] states he wants to discuss with his nephew Kennedy Bucker (who was raised at Mrs. Boswells son) and then discuss with Grants mother (who lives with Mrs. Huseby but is not the HCPOA) before a final decision is made regarding discharging home or residential hospice.   Derek requests that hospice check in with him in the am after he has had time to confer with his family.  [employer] will continue to follow and assist with d/c disposition.  Wallis Bamberg RN, BSN, CCRN [facility] Liaison

## 2021-02-01 NOTE — Progress Notes (Signed)
PT Cancellation Note  Patient Details Name: Pansy Ostrovsky MRN: [id number] DOB: 10/01/1933   Cancelled Treatment:     PT attempt. PT hold. Upon entering room, pts O2 sat sitting at 84% on 9 L HFNC. Increased to 11 L and sao2 increased to 88%. Supportive son at bedside. MD called son while [name] in room.Requested therapist return at a later more appropriate time. Pt did not want to participate at this time but PT will continue to encourage and follow per current POC. RN aware of desaturation and states that pt has been struggling to maintain throughout the day.    Rushie Chestnut 02/01/2021, 3:19 PM

## 2021-02-01 NOTE — Progress Notes (Signed)
Pharmacy Antibiotic Note  Monica [name] is a 87 y.o. female admitted on 01/15/2021 with infected hip following IM nail in February.  Pharmacy has been consulted for Vancomycin Dosing. S/p debridement of the left hip wound by  6/7 with MRSA from OR cultures.  Also on rifampin for presence of foreign material/hardware.    Today, 02/01/2021 Day #17 vancomycin and Day #14 rifampin Renal: SCr = stable at 1.02 AAA repair performed 6/15 WBC WNL  afeb LFTs WNL on 6/13 Vanco 1gm IV q24h, last dose given 6/22 at 14:33, vanco pk = 44 mcg/ml 6/22 at 16:18 (note did not have 1h to distribute from end of infusion).  Vanco trough = 20 6/23 at 13:29 for estimated AUC = 673    Plan: Adjust vancomycin to 750mg  IV q24h based on levels for a new AUC of approx 500-550 Goal AUC 400-600 Monitor renal function  Monitor LFTs weekly - checking in am    Height: 5\' 5"  (165.1 cm) Weight: 99.1 kg (218 lb 7.6 oz) IBW/kg (Calculated) : 57  Temp (24hrs), Avg:98 F (36.7 C), Min:97.9 F (36.6 C), Max:98.2 F (36.8 C)  Recent Labs  Lab 01/25/21 2058 01/26/21 0417 01/27/21 0528 01/28/21 0500 01/29/21 0455 01/30/21 0509 01/31/21 0404 01/31/21 0757 01/31/21 1618 02/01/21 0630 02/01/21 1329  WBC  --    < > 8.9 8.8 9.5 7.9 9.1  --   --  8.1  --   CREATININE  --    < > 0.92 0.98 0.91  --   --  1.03*  --  1.02*  --   VANCOTROUGH 21*  --   --   --   --   --   --   --   --   --  20  VANCOPEAK  --   --   --   --   --   --   --   --  44*  --   --   VANCORANDOM  --    < > 17 17  --   --   --   --   --   --   --    < > = values in this interval not displayed.     Estimated Creatinine Clearance: 47 mL/min (A) (by C-G formula based on SCr of 1.02 mg/dL (H)).    No Known Allergies  Antimicrobials this admission: zosyn x1 dose 6/6 cefepime 6/7>>6/9 Vanc 6/6 (evening)>> Rifampin 6/10>>   Microbiology results:  6/6 BCx: 2of4 (1 set)= GPC=Staph epi, sens pending 6/7 Wound x 3: MRSA 6/8 Bcx: NG  Thank you  for allowing pharmacy to be a part of this patient's care.  8/7, PharmD, BCPS.   Work Cell: [phone] 02/01/2021 2:13 PM

## 2021-02-01 NOTE — Progress Notes (Signed)
Progress Note  Patient Name: Monica [name] Date of Encounter: 02/01/2021  [facility] Cardiologist: Lorine Bears, MD   Subjective   No family at the bedside No symptoms this morning, no chest pain Completed 48 hours IV heparin for chest pain, elevated troponin Feels she is at her baseline, getting stronger daily, has not been out of bed Wheezing this morning, -13 L Note indicating she was +1.2 L yesterday, unclear if it is accurate BNP 1100  Inpatient Medications    Scheduled Meds:  aspirin  81 mg Oral Daily   Chlorhexidine Gluconate Cloth  6 each Topical Daily   clopidogrel  75 mg Oral Daily   docusate  100 mg Per Tube BID   enoxaparin (LOVENOX) injection  0.5 mg/kg Subcutaneous Q24H   feeding supplement  237 mL Oral TID BM   furosemide  40 mg Oral Daily   ipratropium-albuterol  3 mL Nebulization TID   lisinopril  2.5 mg Oral Daily   melatonin  5 mg Oral QHS   methylPREDNISolone (SOLU-MEDROL) injection  40 mg Intravenous Daily   metoprolol succinate  25 mg Oral Daily   multivitamin with minerals  1 tablet Oral Daily   pantoprazole (PROTONIX) IV  40 mg Intravenous Daily   pneumococcal 23 valent vaccine  0.5 mL Intramuscular Tomorrow-1000   polyethylene glycol  17 g Oral Daily   ranolazine  500 mg Oral BID   rifampin  300 mg Oral BID WC   rosuvastatin  40 mg Oral Daily   sodium chloride flush  10-40 mL Intracatheter Q12H   Continuous Infusions:  sodium chloride 250 mL (01/29/21 1103)   promethazine (PHENERGAN) injection (IM or IVPB) Stopped (01/26/21 1902)   vancomycin Stopped (01/31/21 1530)   PRN Meds: bisacodyl, diazepam, ipratropium-albuterol, lactulose, lip balm, [DISCONTINUED] metoCLOPramide **OR** metoCLOPramide (REGLAN) injection, morphine injection, nitroGLYCERIN, [DISCONTINUED] promethazine **OR** promethazine (PHENERGAN) injection (IM or IVPB) **OR** promethazine, sodium chloride flush   Vital Signs    Vitals:   01/31/21 2213 02/01/21 0516  02/01/21 0649 02/01/21 0756  BP:  126/71    Pulse:  93    Resp: 20 20    Temp:  97.9 F (36.6 C)    TempSrc:  Oral    SpO2: 94% 94%  93%  Weight:   99.1 kg   Height:        Intake/Output Summary (Last 24 hours) at 02/01/2021 2992 Last data filed at 01/31/2021 2210 Gross per 24 hour  Intake 1641.14 ml  Output 600 ml  Net 1041.14 ml   Last 3 Weights 02/01/2021 01/31/2021 01/30/2021  Weight (lbs) 218 lb 7.6 oz 214 lb 11.7 oz 217 lb 2.5 oz  Weight (kg) 99.1 kg 97.4 kg 98.5 kg      Telemetry    Normal sinus rhythm- Personally Reviewed  ECG     - Personally Reviewed  Physical Exam   Constitutional:  oriented to person, place, and time. No distress.  HENT:  Head: Grossly normal Eyes:  no discharge. No scleral icterus.  Neck: No JVD, no carotid bruits  Cardiovascular: Regular rate and rhythm, no murmurs appreciated Pulmonary/Chest: Clear to auscultation bilaterally, no wheezes or rails Abdominal: Soft.  no distension.  no tenderness.  Musculoskeletal: Decreased range of motion, weak Neurological:  normal muscle tone. Coordination normal. No atrophy Skin: Skin warm and dry Psychiatric: normal affect, pleasant   Labs    High Sensitivity Troponin:   Recent Labs  Lab 01/20/21 1336 01/20/21 1515 01/29/21 1054 01/29/21 1241  TROPONINIHS 11  12 1,374* 1,349*      Chemistry Recent Labs  Lab 01/27/21 0528 01/28/21 0500 01/29/21 0455 01/30/21 0509 01/31/21 0757 02/01/21 0630  NA 130* 135 130*  --   --   --   K 3.9 3.3* 3.7  --   --   --   CL 93* 98 93*  --   --   --   CO2 28 29 28   --   --   --   GLUCOSE 117* 122* 132*  --   --   --   BUN 29* 28* 26*  --   --   --   CREATININE 0.92 0.98 0.91  --  1.03* 1.02*  CALCIUM 8.2* 8.3* 8.3*  --   --   --   PROT  --   --   --  6.4*  --   --   ALBUMIN  --   --   --  2.6*  --   --   AST  --   --   --  30  --   --   ALT  --   --   --  20  --   --   ALKPHOS  --   --   --  54  --   --   BILITOT  --   --   --  2.4*  --   --    GFRNONAA >60 57* >60  --  53* 54*  ANIONGAP 9 8 9   --   --   --      Hematology Recent Labs  Lab 01/30/21 0509 01/31/21 0404 02/01/21 0630  WBC 7.9 9.1 8.1  RBC 4.37 4.52 4.23  HGB 10.7* 10.9* 10.4*  HCT 33.1* 33.9* 32.5*  MCV 75.7* 75.0* 76.8*  MCH 24.5* 24.1* 24.6*  MCHC 32.3 32.2 32.0  RDW 20.6* 21.0* 21.7*  PLT 243 288 272    BNP Recent Labs  Lab 01/31/21 1618  BNP 1,191.7*     DDimer  No results for input(s): DDIMER in the last 168 hours.    Radiology    No results found.  Cardiac Studies     Patient Profile     87 y.o. female with history of CAD with inferior STEMI in 05/2017 complicated by hypotension and bradycardia status post PCI/DES to the proximal RCA, severe aortic stenosis, GI bleed in 2018, prolonged tobacco use quitting after her MI, HLD, and obesity AAA repair , post-op MRSA infection of left hip  Assessment & Plan   Acute on chronic respiratory distress  Requiring 10 L nasal cannula prior to AAA repair, Post procedure continued respiratory distress Multifactorial including pulmonary fibrosis, COPD, pulmonary hypertension, severe aortic valve stenosis, CHF, deconditioned --- Has been treated with aggressive IV Lasix since that time and management of her COPD, Wheezing this morning, receiving neb treatments Stable renal function, would continue, will change oral Lasix to torsemide given she was fluid positive (torsemide 40 twice daily)  AAA repair repair Over 7 cm in size   Successful endovascular repair  Vascular following  Severe aortic valve stenosis   Currently not a candidate for TAVR in current condition with weakness, acute on chronic respiratory failure, left hip infection, severe COPD, pulmonary hypertension = Would encourage continued discussions with palliative inpatient Recovery would likely be very prolonged given debility, has not been physically capable of working with PT out of bed in weeks     Postsurgical hip  infection   Status postdebridement in OR  June 7 with intraoperative cultures growing MRSA blood cultures negative x2   -Remains relatively immobile  Non-STEMI Seen by Dr. Okey Dupre earlier this week, not a candidate for cardiac catheterization and current status, currently without angina, treated with IV heparin 48 hours, this will be discontinued today -On Plavix, aspirin, beta-blocker Crestor      Total encounter time more than 35 minutes  Greater than 50% was spent in counseling and coordination of care with the patient   For questions or updates, please contact [facility] Please consult [email or web address] for contact info under        Signed, Julien Nordmann, MD  02/01/2021, 8:08 AM

## 2021-02-01 NOTE — Progress Notes (Addendum)
Daily Progress Note   Patient Name: Monica [name]       Date: 02/01/2021 DOB: 1934-01-19  Age: 87 y.o. MRN#: [id number] Attending Physician: Nolberto Hanlon, MD Primary Care Physician: Patient, No Pcp Per (Inactive) Admit Date: 01/15/2021  Reason for Consultation/Follow-up: Establishing goals of care  Subjective: Patient is resting in bed. As we talk her O2 sat decreases as low as to the high 70's. WOB noted. It recovers when she stops talking and breathes through her nose.  Discussed her wishes as per EMR, planning is underway for home with hospice at this time. She confirms her son is her legal HPOA  She states she wants to be with her family and she wants to be at home. She states she wants to do what she can to try to live as long as she can. With more discussion she states she does not want to return to the hospital, but then states "but nobody wants to come back to the hospital." Difference between comfort path at home and continued aggressive care discussed in great detail. Diagnoses and prognosis reviewed in detail. She then states she wants to go home, and following this statement with "why have I got to die so quick". Reviewed diagnoses with prognosis further. Discussed concerns for symptom management at home given her tenuous status, and that she is a candidate for the hospice facility if she wishes in light of her prognosis and possible symptom management burden. Patient states she wants to die at home no matter her level of family assistance at home, and without regards to symptom management concerns. Asked her to describe the plan moving, she states "I'm going home. I'm going to stay there until I die."    Spoke with son who is HPOA, who confirms his HPOA status. He states he is aware of the  plans for home with hospice and wants to come in and talk to her about her wishes to confirm them. He also discusses concerns about her not having the lelvel of assistance she will require at home. He states he will come today to speak with her further.   I completed a MOST form today with patient and the signed original was placed in the chart. A photocopy was placed in the chart to be scanned into EMR. The patient outlined their wishes  for the following treatment decisions:  Cardiopulmonary Resuscitation: Do Not Attempt Resuscitation (DNR/No CPR)  Medical Interventions: Comfort Measures: Keep clean, warm, and dry. Use medication by any route, positioning, wound care, and other measures to relieve pain and suffering. Use oxygen, suction and manual treatment of airway obstruction as needed for comfort. Do not transfer to the hospital unless comfort needs cannot be met in current location.  Antibiotics: Determine use of limitation of antibiotics when infection occurs  IV Fluids: No IV fluids (provide other measures to ensure comfort)  Feeding Tube: No feeding tube    ADDENDUM  3:10pm: Spoke with son a second time, he states he does not believe they will have the help they need at home to have her home with hospice but he will talk to the family. He has discussed previously the complicated family dynamics and reasons that the daughter is currently living with the patient. Patient raised Fatima Sanger, the daughter's son. Discussed that hospice liaison would be in to speak with him regarding options. She is appropriate for hospice facility or home with hospice. From a prognostic standpoint, if she were made full comfort care here, a hospital death would be anticipated.    Length of Stay: 17  Current Medications: Scheduled Meds:  . aspirin  81 mg Oral Daily  . Chlorhexidine Gluconate Cloth  6 each Topical Daily  . clopidogrel  75 mg Oral Daily  . docusate  100 mg Per Tube BID  . enoxaparin (LOVENOX)  injection  0.5 mg/kg Subcutaneous Q24H  . feeding supplement  237 mL Oral TID BM  . ipratropium-albuterol  3 mL Nebulization TID  . lisinopril  2.5 mg Oral Daily  . melatonin  5 mg Oral QHS  . methylPREDNISolone (SOLU-MEDROL) injection  40 mg Intravenous Daily  . metoprolol succinate  25 mg Oral Daily  . multivitamin with minerals  1 tablet Oral Daily  . pantoprazole (PROTONIX) IV  40 mg Intravenous Daily  . pneumococcal 23 valent vaccine  0.5 mL Intramuscular Tomorrow-1000  . polyethylene glycol  17 g Oral Daily  . ranolazine  500 mg Oral BID  . rifampin  300 mg Oral BID WC  . rosuvastatin  40 mg Oral Daily  . sodium chloride flush  10-40 mL Intracatheter Q12H  . torsemide  40 mg Oral BID    Continuous Infusions: . sodium chloride 250 mL (01/29/21 1103)  . promethazine (PHENERGAN) injection (IM or IVPB) Stopped (01/26/21 1902)  . vancomycin Stopped (01/31/21 1530)    PRN Meds: bisacodyl, diazepam, ipratropium-albuterol, lactulose, lip balm, [DISCONTINUED] metoCLOPramide **OR** metoCLOPramide (REGLAN) injection, morphine injection, nitroGLYCERIN, [DISCONTINUED] promethazine **OR** promethazine (PHENERGAN) injection (IM or IVPB) **OR** promethazine, sodium chloride flush  Physical Exam Pulmonary:     Comments: WOB Skin:    General: Skin is warm and dry.  Neurological:     Mental Status: She is alert.            Vital Signs: BP 125/73 (BP Location: Right Arm)   Pulse 92   Temp 97.9 F (36.6 C) (Oral)   Resp (!) 21   Ht 5' 5"  (1.651 m)   Wt 99.1 kg   SpO2 97%   BMI 36.36 kg/m  SpO2: SpO2: 97 % O2 Device: O2 Device: High Flow Nasal Cannula O2 Flow Rate: O2 Flow Rate (L/min): 9 L/min  Intake/output summary:  Intake/Output Summary (Last 24 hours) at 02/01/2021 1102 Last data filed at 01/31/2021 2210 Gross per 24 hour  Intake 1401.14 ml  Output 600 ml  Net 801.14 ml   LBM: Last BM Date: 01/30/21 Baseline Weight: Weight: 106.6 kg Most recent weight: Weight: 99.1  kg        Flowsheet Rows    Flowsheet Row Most Recent Value  Intake Tab   Referral Department Hospitalist  Unit at Time of Referral Med/Surg Unit  Date Notified 01/23/21  Palliative Care Type New Palliative care  Reason for referral Clarify Goals of Care  Date of Admission 01/15/21  Date first seen by Palliative Care 01/23/21  # of days Palliative referral response time 0 Day(s)  # of days IP prior to Palliative referral 8  Clinical Assessment   Psychosocial & Spiritual Assessment   Palliative Care Outcomes        Patient Active Problem List   Diagnosis Date Noted  . Non-ST elevation (NSTEMI) myocardial infarction ([place])   . Hypoxia   . Shortness of breath   . Pre-op evaluation   . Abdominal aortic aneurysm (AAA) without rupture ([place])   . Obesity, Class III, BMI 40-49.9 (morbid obesity) ([place])   . Abscess of left hip 01/15/2021  . Acute respiratory failure with hypoxia ([place]) 10/05/2020  . Severe aortic stenosis   . Closed comminuted intertrochanteric fracture of proximal end of left femur ([place]) 10/02/2020  . Aortic stenosis   . Obesity   . Cellulitis of left lower extremity 04/29/2018  . GI bleed 06/05/2017  . Nausea vomiting and diarrhea   . Acute ST elevation myocardial infarction (STEMI) of inferior wall ([place]) 06/02/2017  . CAD (coronary artery disease) 2018    Palliative Care Assessment & Plan   Recommendations/Plan: Son who is HPOA is coming to talk with patient. Patient is hospice facility appropriate if she wishes following family conversation.      Code Status:    Code Status Orders  (From admission, onward)           Start     Ordered   01/23/21 1543  Do not attempt resuscitation (DNR)  Continuous       Question Answer Comment  In the event of cardiac or respiratory ARREST Do not call a "code blue"   In the event of cardiac or respiratory ARREST Do not perform Intubation, CPR, defibrillation or ACLS   In the event of cardiac or respiratory  ARREST Use medication by any route, position, wound care, and other measures to relive pain and suffering. May use oxygen, suction and manual treatment of airway obstruction as needed for comfort.   Comments MOST form on chart.      01/23/21 1542           Code Status History     Date Active Date Inactive Code Status Order ID Comments User Context   01/15/2021 2309 01/23/2021 1542 Full Code [id number]  Sidney Ace Arvella Merles, MD Inpatient   10/02/2020 1117 10/09/2020 2150 Full Code [id number]  Collier Bullock, MD ED   04/30/2018 1018 05/02/2018 1808 DNR [id number]  Bettey Costa, MD Inpatient   04/29/2018 1732 04/30/2018 1018 Full Code [id number]  Henreitta Leber, MD Inpatient   06/05/2017 0521 06/07/2017 1404 Full Code [id number]  Saundra Shelling, MD Inpatient   06/02/2017 0059 06/04/2017 1932 Full Code [id number]  Wellington Hampshire, MD Inpatient      Advance Directive Documentation    Flowsheet Row Most Recent Value  Type of Advance Directive Healthcare Power of Attorney, Living will  Pre-existing out of facility DNR order (yellow form or pink MOST form) --  "MOST" Form in  Place? --       Prognosis:  < 2 weeks Tenuous respiratory status. Severe AS, NSTEMI this admission.    Care plan was discussed with RN  Thank you for allowing the Palliative Medicine Team to assist in the care of this patient.   Time In: 9:30 2:30 Time Out: 11:00 3:00 Total Time 90 min 30 min Prolonged Time Billed  yes       Greater than 50%  of this time was spent counseling and coordinating care related to the above assessment and plan.  Asencion Gowda, NP  Please contact Palliative Medicine Team phone at [phone] for questions and concerns.

## 2021-02-01 NOTE — Progress Notes (Signed)
Date of Admission:  01/15/2021    ID: Monica [name] is a 87 y.o. female  Active Problems:   Abscess of left hip   Abdominal aortic aneurysm (AAA) without rupture ([facility])   Obesity, Class III, BMI 40-49.9 (morbid obesity) ([facility])   Pre-op evaluation   Shortness of breath   Hypoxia   Non-ST elevation (NSTEMI) myocardial infarction [facility])    Subjective: Says she is feeling better Breathing better Appetite better today  Medications:   aspirin  81 mg Oral Daily   Chlorhexidine Gluconate Cloth  6 each Topical Daily   clopidogrel  75 mg Oral Daily   docusate  100 mg Per Tube BID   enoxaparin (LOVENOX) injection  0.5 mg/kg Subcutaneous Q24H   feeding supplement  237 mL Oral TID BM   ipratropium-albuterol  3 mL Nebulization TID   lisinopril  2.5 mg Oral Daily   melatonin  5 mg Oral QHS   methylPREDNISolone (SOLU-MEDROL) injection  40 mg Intravenous Daily   metoprolol succinate  25 mg Oral Daily   multivitamin with minerals  1 tablet Oral Daily   pantoprazole (PROTONIX) IV  40 mg Intravenous Daily   pneumococcal 23 valent vaccine  0.5 mL Intramuscular Tomorrow-1000   polyethylene glycol  17 g Oral Daily   ranolazine  500 mg Oral BID   rifampin  300 mg Oral BID WC   rosuvastatin  40 mg Oral Daily   sodium chloride flush  10-40 mL Intracatheter Q12H   torsemide  40 mg Oral BID    Objective: Vital signs in last 24 hours: Temp:  [97.9 F (36.6 C)] 97.9 F (36.6 C) (06/23 1200) Pulse Rate:  [90-93] 92 (06/23 1200) Resp:  [20-23] 20 (06/23 1200) BP: (107-126)/(65-73) 107/65 (06/23 1200) SpO2:  [87 %-97 %] 97 % (06/23 1257) FiO2 (%):  [56 %] 56 % (06/23 0516) Weight:  [99.1 kg] 99.1 kg (06/23 0649)  PHYSICAL EXAM:  General: Alert, cooperative, resp distress, appears young for age.  Head: Normocephalic, without obvious abnormality, atraumatic. Eyes: Conjunctivae clear, anicteric sclerae. Pupils are equal ENT Nares normal. No drainage or sinus tenderness. Lips, mucosa, and tongue  normal. No Thrush Hi flo oxygen Neck: Supple, symmetrical, no adenopathy, thyroid: non tender no carotid bruit and no JVD. Back: No CVA tenderness. Lungs: b/l air entry- crepts  Heart: irregular Abdomen: Soft,  Extremities: left hip surgical I/D has healed completely Skin: No rashes or lesions. Or bruising Lymph: Cervical, supraclavicular normal. Neurologic: Grossly non-focal  Lab Results Recent Labs    01/31/21 0404 01/31/21 0757 02/01/21 0630  WBC 9.1  --  8.1  HGB 10.9*  --  10.4*  HCT 33.9*  --  32.5*  CREATININE  --  1.03* 1.02*   Liver Panel Recent Labs    01/30/21 0509  PROT 6.4*  ALBUMIN 2.6*  AST 30  ALT 20  ALKPHOS 54  BILITOT 2.4*  BILIDIR 1.0*  IBILI 1.4*    Impression/recommendation  Abscess left thigh/hip area due to MRSA's.  Status post debridement.  Had a wound VAC but now that was removed and the wound has healed completely.  Initially the concern was whether the underlying bone was involved because of previous ORIF.  I doubt that is the case now.  She is currently on vancomycin and rifampin.  Day 16 of antibiotics.  We will be able to discontinue IV and put her on p.o. Soon  Severe aortic stenosis.  Currently due to many comorbidities is not a candidate for  TAVR.  Abdominal aortic aneurysm status post endovascular repair during this hospitalization.  NSTEMI during this hospitalization  Acute on chronic hypoxic respiratory failure on nasal cannula 10 L of oxygen.Marland Kitchen  History of left femur fracture status post ORIF on 10/03/2020.  Discussed the management with the patient and her grandson.Marland Kitchen

## 2021-02-02 LAB — BASIC METABOLIC PANEL
Anion gap: 10 (ref 5–15)
BUN: 28 mg/dL — ABNORMAL HIGH (ref 8–23)
CO2: 30 mmol/L (ref 22–32)
Calcium: 8 mg/dL — ABNORMAL LOW (ref 8.9–10.3)
Chloride: 90 mmol/L — ABNORMAL LOW (ref 98–111)
Creatinine, Ser: 1.12 mg/dL — ABNORMAL HIGH (ref 0.44–1.00)
GFR, Estimated: 48 mL/min — ABNORMAL LOW (ref >60)
Glucose, Bld: 114 mg/dL — ABNORMAL HIGH (ref 70–99)
Potassium: 3.2 mmol/L — ABNORMAL LOW (ref 3.5–5.1)
Sodium: 130 mmol/L — ABNORMAL LOW (ref 135–145)

## 2021-02-02 LAB — HEPATIC FUNCTION PANEL
ALT: 17 U/L (ref 0–44)
AST: 24 U/L (ref 15–41)
Albumin: 2.7 g/dL — ABNORMAL LOW (ref 3.5–5.0)
Alkaline Phosphatase: 55 U/L (ref 38–126)
Bilirubin, Direct: 0.4 mg/dL — ABNORMAL HIGH (ref 0.0–0.2)
Indirect Bilirubin: 0.9 mg/dL (ref 0.3–0.9)
Total Bilirubin: 1.3 mg/dL — ABNORMAL HIGH (ref 0.3–1.2)
Total Protein: 6.7 g/dL (ref 6.5–8.1)

## 2021-02-02 MED ORDER — DOXYCYCLINE HYCLATE 100 MG PO TABS
100.0000 mg | ORAL_TABLET | Freq: Two times a day (BID) | ORAL | Status: DC
  Administered 2021-02-02 – 2021-02-10 (×16): 100 mg via ORAL
  Filled 2021-02-02 (×16): qty 1

## 2021-02-02 MED ORDER — POTASSIUM CHLORIDE CRYS ER 20 MEQ PO TBCR
30.0000 meq | EXTENDED_RELEASE_TABLET | Freq: Two times a day (BID) | ORAL | Status: AC
  Administered 2021-02-02 (×2): 30 meq via ORAL
  Filled 2021-02-02 (×2): qty 1

## 2021-02-02 MED ORDER — METOPROLOL SUCCINATE ER 25 MG PO TB24
25.0000 mg | ORAL_TABLET | Freq: Every day | ORAL | Status: DC
  Administered 2021-02-04 – 2021-02-10 (×7): 25 mg via ORAL
  Filled 2021-02-02 (×8): qty 1

## 2021-02-02 MED ORDER — BLISTEX MEDICATED EX OINT
TOPICAL_OINTMENT | CUTANEOUS | Status: DC | PRN
  Filled 2021-02-02: qty 6.3

## 2021-02-02 MED ORDER — METOPROLOL SUCCINATE ER 25 MG PO TB24: 12.5000 mg | ORAL_TABLET | Freq: Every day | ORAL | Status: DC

## 2021-02-02 NOTE — Progress Notes (Signed)
Progress Note  Patient Name: Monica [name] Date of Encounter: 02/02/2021  Primary Cardiologist: Kirke Corin  Subjective   No chest pain, dyspnea, palpitations, dizziness, presyncope, or syncope.  Left hip wound is healing well.  BP soft though stable.  Potassium low at 3.2.  Renal function relatively stable.  She has completed 48 hours of IV heparin for chest pain/elevated troponin.  Documented urine output 1.8 L for the past 24 hours with a net -13.5 L for the admission.  Continues to have significant supplemental oxygen requirement on high flow nasal cannula.  Inpatient Medications    Scheduled Meds:  aspirin  81 mg Oral Daily   Chlorhexidine Gluconate Cloth  6 each Topical Daily   clopidogrel  75 mg Oral Daily   docusate  100 mg Per Tube BID   doxycycline  100 mg Oral Q12H   enoxaparin (LOVENOX) injection  0.5 mg/kg Subcutaneous Q24H   feeding supplement  237 mL Oral TID BM   ipratropium-albuterol  3 mL Nebulization TID   lisinopril  2.5 mg Oral Daily   melatonin  5 mg Oral QHS   methylPREDNISolone (SOLU-MEDROL) injection  40 mg Intravenous Daily   [START ON 02/03/2021] metoprolol succinate  25 mg Oral Daily   multivitamin with minerals  1 tablet Oral Daily   pantoprazole (PROTONIX) IV  40 mg Intravenous Daily   pneumococcal 23 valent vaccine  0.5 mL Intramuscular Tomorrow-1000   polyethylene glycol  17 g Oral Daily   potassium chloride  30 mEq Oral BID   ranolazine  500 mg Oral BID   rifampin  300 mg Oral BID WC   rosuvastatin  40 mg Oral Daily   sodium chloride flush  10-40 mL Intracatheter Q12H   torsemide  40 mg Oral BID   Continuous Infusions:  sodium chloride 250 mL (01/29/21 1103)   promethazine (PHENERGAN) injection (IM or IVPB) Stopped (01/26/21 1902)   PRN Meds: bisacodyl, diazepam, ipratropium-albuterol, lactulose, lip balm, [DISCONTINUED] metoCLOPramide **OR** metoCLOPramide (REGLAN) injection, morphine injection, nitroGLYCERIN, [DISCONTINUED] promethazine  **OR** promethazine (PHENERGAN) injection (IM or IVPB) **OR** promethazine, sodium chloride flush   Vital Signs    Vitals:   02/02/21 0410 02/02/21 0751 02/02/21 0803 02/02/21 1157  BP: 104/63  (!) 99/55 108/68  Pulse: 81  87 83  Resp: 20  15 18   Temp: 97.8 F (36.6 C)  97.9 F (36.6 C) 98 F (36.7 C)  TempSrc: Oral  Oral Oral  SpO2: 93% 94% 94% 95%  Weight:      Height:        Intake/Output Summary (Last 24 hours) at 02/02/2021 1636 Last data filed at 02/02/2021 0800 Gross per 24 hour  Intake 570 ml  Output 300 ml  Net 270 ml   Filed Weights   01/30/21 0500 01/31/21 1100 02/01/21 0649  Weight: 98.5 kg 97.4 kg 99.1 kg    Telemetry    SR with PVCs - Personally Reviewed  ECG    No new tracings - Personally Reviewed  Physical Exam   GEN: No acute distress.   Neck: No JVD. Cardiac: RRR, III/VI systolic murmur RUSB, no rubs, or gallops.  Respiratory: Clear to auscultation bilaterally.  GI: Soft, nontender, non-distended.   MS: No edema; No deformity.  Well-healing left hip wound with clean and intact dressing in place. Neuro:  Alert and oriented x 3; Nonfocal.  Psych: Normal affect.  Labs    Chemistry Recent Labs  Lab 01/28/21 0500 01/29/21 0455 01/30/21 0509 01/31/21 0757 02/01/21 0630  02/02/21 0605  NA 135 130*  --   --   --  130*  K 3.3* 3.7  --   --   --  3.2*  CL 98 93*  --   --   --  90*  CO2 29 28  --   --   --  30  GLUCOSE 122* 132*  --   --   --  114*  BUN 28* 26*  --   --   --  28*  CREATININE 0.98 0.91  --  1.03* 1.02* 1.12*  CALCIUM 8.3* 8.3*  --   --   --  8.0*  PROT  --   --  6.4*  --   --  6.7  ALBUMIN  --   --  2.6*  --   --  2.7*  AST  --   --  30  --   --  24  ALT  --   --  20  --   --  17  ALKPHOS  --   --  54  --   --  55  BILITOT  --   --  2.4*  --   --  1.3*  GFRNONAA 57* >60  --  53* 54* 48*  ANIONGAP 8 9  --   --   --  10     Hematology Recent Labs  Lab 01/30/21 0509 01/31/21 0404 02/01/21 0630  WBC 7.9 9.1 8.1   RBC 4.37 4.52 4.23  HGB 10.7* 10.9* 10.4*  HCT 33.1* 33.9* 32.5*  MCV 75.7* 75.0* 76.8*  MCH 24.5* 24.1* 24.6*  MCHC 32.3 32.2 32.0  RDW 20.6* 21.0* 21.7*  PLT 243 288 272    Cardiac EnzymesNo results for input(s): TROPONINI in the last 168 hours. No results for input(s): TROPIPOC in the last 168 hours.   BNP Recent Labs  Lab 01/31/21 1618  BNP 1,191.7*     DDimer No results for input(s): DDIMER in the last 168 hours.   Radiology    No results found.  Cardiac Studies   2D echo 01/29/2021: 1. Left ventricular ejection fraction, by estimation, is 30 to 35%. The  left ventricle has moderately decreased function. The left ventricle  demonstrates regional wall motion abnormalities (see scoring  diagram/findings for description). There is  moderate left ventricular hypertrophy. Left ventricular diastolic  parameters are indeterminate. There is severe hypokinesis of the left  ventricular, mid-apical anteroseptal wall, anterior wall, anterolateral  wall and apical segment.   2. Right ventricular systolic function is normal. The right ventricular  size is mildly enlarged. There is moderately elevated pulmonary artery  systolic pressure.   3. Left atrial size was mildly dilated.   4. The mitral valve is degenerative. Mild mitral valve regurgitation.  Mild mitral stenosis. Moderate mitral annular calcification.   5. The aortic valve is tricuspid. There is moderate calcification of the  aortic valve. There is severe thickening of the aortic valve. Aortic valve  regurgitation is trivial. Severe aortic valve stenosis. Aortic valve area,  by VTI measures 0.54 cm.  Aortic valve mean gradient measures 32.0 mmHg.   6. The inferior vena cava is normal in size with greater than 50%  respiratory variability, suggesting right atrial pressure of 3 mmHg.  Patient Profile     87 y.o. female with history of CAD with inferior STEMI in 05/2017 complicated by hypotension and bradycardia  status post PCI/DES to the proximal RCA, severe aortic stenosis, GI bleed in 2018, chronic hypoxic  respiratory failure, pulmonary fibrosis, prolonged tobacco use quitting after her MI, HLD, and obesity who was admitted 6/6 for postop hip infection and respiratory failure and incidentally noted to have a large AAA now status post endovascular repair with postoperative course being complicated by NSTEMI and ongoing respiratory failure requiring significant supplemental oxygen via high flow nasal cannula.  Assessment & Plan    1.  CAD involving the native coronary arteries with NSTEMI: -No further chest pain -High-sensitivity troponin elevated and flat trending -Status post 48 hours of IV heparin -Not currently a cardiac cath candidate given her severe acute illness and comorbid conditions -Continue metoprolol, recently added ranolazine, and lisinopril -DAPT as outlined below -She will discuss cardiac cath options with her primary cardiologist early next week  2.  HFrEF secondary to ICM: -EF down to 30 to 35% with suggestive of possible recent anterior MI -Appears euvolemic -Continue metoprolol, lisinopril, and torsemide -Further GDMT has been limited in the setting of relative hypotension and especially in light of her severe AS  3.  Acute on chronic hypoxic respiratory failure: -She continues to have significant supplemental oxygen requirement and remains on high flow nasal cannula -Crackles on exam in the setting of fibrosis noted on chest CT this admission -Management per internal medicine  4.  AAA: -Status post endovascular repair -Currently on aspirin and Plavix -Management per vascular surgery  5.  Severe aortic stenosis: -Overall this is a difficult situation with the patient likely being a poor TAVR candidate in the setting of numerous comorbidities -She would like to discuss this further with her primary cardiologist when he is rounding in the hospital on 6/27  6.   Postoperative hip infection: -Improving -Followed by IM and infectious disease  For questions or updates, please contact [facility] Please consult [email or web address] for contact info under Cardiology/STEMI.    Signed, Eula Listen, PA-C [facility] Pager: [phone] 02/02/2021, 4:36 PM

## 2021-02-02 NOTE — Progress Notes (Signed)
Pt LUE is swollen around surgical site also feels firm to the touch. Sent vascular message.

## 2021-02-02 NOTE — Progress Notes (Signed)
[id number]   Civil engineer, contracting [facility]) [facility] Liaison note:  Spoke with son Renea Schoonmaker to follow up as requested. He states that patient's family would like to proceed with Hospice Home referral. Per Maryann Alar, RN Transitions of Care Manager, patient stated that she is in agreement with son's request.  Chart reviewed by [facility] physician. Hospice Home eligibility pending at this time. Plan is to re evaluate on Monday. Unfortunately Hospice Home is not able to offer a room today. Family and Maryann Alar, RN [facility] manager notified of above.   Please do not hesitate to call with any hospice related questions or concerns.   Thank you for the opportunity to participate in this patient's care.   Bobbie "Einar Gip, RN, BSN [facility] Liaison [phone]

## 2021-02-02 NOTE — Progress Notes (Signed)
Date of Admission:  01/15/2021   T  ID: Monica [name] is a 87 y.o. female  Active Problems:   Abscess of left hip   Abdominal aortic aneurysm (AAA) without rupture (HCC)   Obesity, Class III, BMI 40-49.9 (morbid obesity) (HCC)   Pre-op evaluation   Shortness of breath   Hypoxia   Non-ST elevation (NSTEMI) myocardial infarction (HCC)    Subjective: Doing better Breathing better   Medications:   aspirin  81 mg Oral Daily   Chlorhexidine Gluconate Cloth  6 each Topical Daily   clopidogrel  75 mg Oral Daily   docusate  100 mg Per Tube BID   doxycycline  100 mg Oral Q12H   enoxaparin (LOVENOX) injection  0.5 mg/kg Subcutaneous Q24H   feeding supplement  237 mL Oral TID BM   ipratropium-albuterol  3 mL Nebulization TID   lisinopril  2.5 mg Oral Daily   melatonin  5 mg Oral QHS   methylPREDNISolone (SOLU-MEDROL) injection  40 mg Intravenous Daily   metoprolol succinate  25 mg Oral Daily   multivitamin with minerals  1 tablet Oral Daily   pantoprazole (PROTONIX) IV  40 mg Intravenous Daily   pneumococcal 23 valent vaccine  0.5 mL Intramuscular Tomorrow-1000   polyethylene glycol  17 g Oral Daily   ranolazine  500 mg Oral BID   rifampin  300 mg Oral BID WC   rosuvastatin  40 mg Oral Daily   sodium chloride flush  10-40 mL Intracatheter Q12H   torsemide  40 mg Oral BID    Objective: Vital signs in last 24 hours: Temp:  [97.6 F (36.4 C)-98 F (36.7 C)] 98 F (36.7 C) (06/24 1157) Pulse Rate:  [81-87] 83 (06/24 1157) Resp:  [15-20] 18 (06/24 1157) BP: (95-108)/(51-68) 108/68 (06/24 1157) SpO2:  [92 %-95 %] 95 % (06/24 1157)  PHYSICAL EXAM:  General: Alert, cooperative, no distress,  Head: Normocephalic, without obvious abnormality, atraumatic. Eyes: Conjunctivae clear, anicteric sclerae. Pupils are equal ENT Nares normal. No drainage or sinus tenderness. Lips, mucosa, and tongue normal. No Thrush Neck: Supple, symmetrical, no adenopathy, thyroid: non tender no  carotid bruit and no JVD. Back: No CVA tenderness. Lungs: b/l air entry- crepts bases. Heart: systolic murmur Abdomen: Soft,  Extremities: left hip- surgical wound healed   Skin: No rashes or lesions. Or bruising Lymph: Cervical, supraclavicular normal. Neurologic: Grossly non-focal  Lab Results Recent Labs    01/31/21 0404 01/31/21 0757 02/01/21 0630 02/02/21 0605  WBC 9.1  --  8.1  --   HGB 10.9*  --  10.4*  --   HCT 33.9*  --  32.5*  --   NA  --   --   --  130*  K  --   --   --  3.2*  CL  --   --   --  90*  CO2  --   --   --  30  BUN  --   --   --  28*  CREATININE  --    < > 1.02* 1.12*   < > = values in this interval not displayed.   Liver Panel Recent Labs    02/02/21 0605  PROT 6.7  ALBUMIN 2.7*  AST 24  ALT 17  ALKPHOS 55  BILITOT 1.3*  BILIDIR 0.4*  IBILI 0.9  Microbiology: 6/6 BC staph epi 6/7= wound culture MRSA Studies/Results: No results found.   Assessment/Plan:  MRSA infection at the site of previous ORIF- this  looks like it is an infection of the soft tissue and not the bone as the I/D has healed She has already received 17 days of IV vancomycin Can change to PO doxycycline Would stop rifampin on 02/06/21  H/O fracture femir left with ORIF in Feb 2022  AAA aneursym- has had endovascular repair 01/24/21  Aortic stenosis- needs TAVR , but high risk candidate  Acute hypoxic resp failure- was on H Flo oxygen, now on nasal cannula  Discussed the management with patient, family, ortho and hospitalist. ID will follow her peripherally this weekend Call if needed

## 2021-02-02 NOTE — Progress Notes (Signed)
PROGRESS NOTE    Monica [name]  PJA:[id number] DOB: [birth year] DOA: 01/15/2021 PCP: Patient, No Pcp Per (Inactive)   Brief Narrative:  Monica [name] is a 87 y.o. F with CAD s/p PCI x1 in 2018, severe AS being worked up for TAVR, Obesity, pHTN, and COPD who presented with hip pain.   Had hip fracture after fall in Feb, repaired and discharged home.  Since being home 4 months ago, really never able to walk due to pain. Finally, in the last week before admission, developed redness and worse pain at left hip surgical site, then wound dehisced.   Presented to the ER and Orthopedics were consulted for suspected post-surgical hip infection with MRSA.  ID is recommending 4 weeks of vancomycin and rifampin.    Also noted incidentally to have very large AAA, she was taken to the OR for endovascular repair with vascular surgery today.  Also had severe aortic stenosis and plan is for TAVR work-up after AAA repair, most likely as an outpatient if able to get out of hospital from current admission.  She will not be a good candidate due to multiple comorbidities.  Patient with multiple life-threatening comorbidities-high risk for deterioration and death, palliative care was also consulted, patient is now on DNR/DNI.  She will consider comfort care if deteriorates. At this time we will continue current level of care.  Patient had her endovascular AAA repair done with vascular surgery on 01/24/2021, she remained intubated and in ICU for 2 days after that, extubated yesterday and we will resume care again from today.  Remained on heated high flow, weak cough and appears very lethargic.  6/18: Appears more lethargic with worsening oxygen requirement, today at 50 L and 65% FiO2  6/19: Little improvement in FiO2, started taking few sips here and there.  6/20: Patient remains on heated high flow at 55 L with 50% FiO2.  Developed chest pain which he responded to nitroglycerin.  Troponin elevated at 1374, EKG with  nonspecific T wave changes.  Concern of NSTEMI.  Cardiology was reconsulted, they are recommending conservative management.  She was started on heparin infusion after consulting vascular surgery as she recently had her endovascular AAA repair.  6/21: Echocardiogram with newly reduced EF to 30 to 35% with regional wall motion abnormalities consistent with recent anterior MI.  Not a candidate for cardiac catheterization.  Medical management per cardiology.  Remains on high level of oxygen with heated high flow.  Palliative care again met her but she wants to keep pushing, prognosis is very guarded due to multiple life limiting comorbidities.  Very high risk for deterioration and death.  6/22-spoke to daughter via phone today discussed patient's prognosis.  Still on 9 L high flow.  Today patient was not feeling well and felt she was still short of breath.  After much discussion with daughter, she is agreeable to home with hospice.  We have consulted hospice to evaluate patient.  6/23 when I went to see patient she was on 9 L high flow nasal cannula satting 86%, I increase it to 13 with 02 sat 93%.  She reports less short of breath but talking to her she appears to be still short of breath.  6/24pt without complaints this am. Feels like she is "doing better" .  However requiring 10 L high flow [place].  Also hypotensive while at rest.  Discussed with son about his decision whether to do home hospice or hospice house and they would like to proceed with hospice  house.  Subjective: No chest pain or abdominal pain or dizziness   Assessment & Plan:   Active Problems:   Abscess of left hip   Abdominal aortic aneurysm (AAA) without rupture (HCC)   Obesity, Class III, BMI 40-49.9 (morbid obesity) (HCC)   Pre-op evaluation   Shortness of breath   Hypoxia   Non-ST elevation (NSTEMI) myocardial infarction ([place])  Chest pain/NSTEMI.   Concern of NSTEMI with markedly elevated troponin.  Chest pain responded to  nitroglycerin.  History of CAD with PCI to RCA.  Echocardiogram with new onset of reduced EF with regional wall motion abnormalities consistent with recent anterior MI. Cardiology was reconsulted and they are recommending conservative management. Cardiology started her on heparin infusion after consulting vascular surgery. 6/22 she will need medical management with aspirin, Plavix x12 months 6/24 continue Ranexa for antianginal benefits  Completed heparin drip  Continue ACE and beta-blockers  Plan to go to hospice house but family would like to continue current medical treatment until bed is available.         Postsurgical hip infection S/p debridement of the left hip wound by Dr. Posey Pronto, 6/7 Admitted and taken to OR 6/7 for washout.  Intraoperative cultures growing MRSA in 3/3. ID consulted, recommended treating with 4 weeks antibiotics for presumed MRSA prosthetic infection.  1/2 blood cultures positive for staph epidermidis which was thought to be a contaminant. -Orthopedic remove the suture and drain today-no obvious sign of infection according to their note. 6/24 continue rifampin and vancomycin day 18 of 28.  We will follow-up on ID        Lower abdominal pain.  Operative sites looks okay.   Possibly due to constipation.  Appears to be resolved.  6/24 continue bowel regimen    Severe aortic stenosis Previously known, since hip fracture in Feb.  Was pending TAVR eval prior to this admission, seen by Cardiology here. 6/24 no plans to start TAVR work-up at this time while hospitalized.  And plan is to go to hospice house.   Neurology is following  Chronic DHF-Lasix was switched to torsemide  Continue beta-blockers  Monitor vitals    Hypokalemia-Will replace potassium Monitor levels   Hypotension due to aortic stenosis Continue midodrine    Acute on chronic hypoxic respiratory failure.  Mild improvement in FiO2 requirement today.  Otherwise remained on heated high  flow at 45 L and 50% FiO2. Has chronic hypoxic respiratory failure due to aortic stenosis, heart failure, and pulmonary hypertension.  COPD is in chart but appears to be mild. Former smoker.  Uses 2 L of oxygen at baseline.  Initially requiring 2 to 3 L and then got worse requiring up to 10 L starting from 6/12. Chest x-ray unremarkable, CT angiogram of the chest with patchy subpleural fibrosis, no PE, moderate atelectasis, no edema or large consolidation, no effusion. Patient required prolonged intubation after the procedure, extubated but continued to require very high levels of oxygen. - Consult to pulmonogy-appreciate their help - Continue Solumedrol BID for NSIP   6/23 today requiring more than 9 L high flow nasal cannula as she was satting 86% on 9 L.  BNP elevated.   Torsemide per cardiology.      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  Patient was evaluated by vascular surgery, who recommended urgent repair given the size.  She is not a candidate for open repair.  She was  taken to the OR for endovascular repair-tolerated the procedure well, stable from vascular standpoint and will follow-up as an outpatient. -Appreciate vascular help.   Iron deficiency anemia Hgb stable Given Feraheme 6/10 -Transfusion threshold 8 g/dL  Hyponatremia.  Resolved Mild, asymptomatic  Vaginal candidiasis Given Fluconazole 1 dose on 6/10.   Left renal nodule Incidental finding, minor, given gravity of AAA and AS and hypoxia, this likely has not been discussed with patient and should be followed up only depending on goals of care after AAA repair - Follow-up MRI as an outpatient  Class II obesity. Estimated body mass index is 36.36 kg/m as calculated from the following:   Height as of this encounter: _0  (1.651 m).   Weight as of this encounter: 99.1 kg.   Goals of care.hospice to  evaluate patient for home hospice versus hospice house  Objective: Vitals:   02/02/21 0410 02/02/21 0751 02/02/21 0803 02/02/21 1157  BP: 104/63  (!) 99/55 108/68  Pulse: 81  87 83  Resp: _1 Temp: 97.8 F (36.6 C)  97.9 F (36.6 C) 98 F (36.7 C)  TempSrc: Oral  Oral Oral  SpO2: 93% 94% 94% 95%  Weight:      Height:        Intake/Output Summary (Last 24 hours) at 02/02/2021 1554 Last data filed at 02/02/2021 0800 Gross per 24 hour  Intake 710.92 ml  Output 300 ml  Net 410.92 ml   Filed Weights   01/30/21 0500 01/31/21 1100 02/01/21 0649  Weight: 98.5 kg 97.4 kg 99.1 kg    Examination:  Calm, lying in bed Decreased breath sounds no wheezing Regular S1-S2 no gallops Soft benign positive bowel sounds Positive edema Awake and alert and oriented Mood and affect appropriate in current setting   DVT prophylaxis: Lovenox Code Status: DNR Family Communication: Son updated at bedside Disposition Plan:  Status is: Inpatient  Remains inpatient appropriate because:Inpatient level of care appropriate due to severity of illness  Dispo: The patient is from:  Home              Anticipated d/c is to:  To be determined              Patient currently is not medically stable to d/c.   Difficult to place patient No                Level of care: Progressive Cardiac  Consultants:  Vascular surgery Pulmonology Cardiology  Procedures:  Antimicrobials:  Vancomycin Rifampin  Data Reviewed: I have personally reviewed following labs and imaging studies  CBC: Recent Labs  Lab 01/28/21 0500 01/29/21 0455 01/30/21 0509 01/31/21 0404 02/01/21 0630  WBC 8.8 9.5 7.9 9.1 8.1  HGB 11.0* 11.6* 10.7* 10.9* 10.4*  HCT 34.8* 36.4 33.1* 33.9* 32.5*  MCV 77.0* 74.7* 75.7* 75.0* 76.8*  PLT 175 212 243 288 332   Basic Metabolic Panel: Recent Labs  Lab 01/27/21 0528 01/28/21 0500 01/29/21 0455 01/31/21 0757 02/01/21 0630 02/02/21 0605  NA 130* 135 130*  --   --  130*   K 3.9 3.3* 3.7  --   --  3.2*  CL 93* 98 93*  --   --  90*  CO2 _2 --   --  30  GLUCOSE 117* 122* 132*  --   --  114*  BUN 29* 28* 26*  --   --  28*  CREATININE 0.92 0.98 0.91 1.03* 1.02* 1.12*  CALCIUM  8.2* 8.3* 8.3*  --   --  8.0*  MG 2.4 2.2 2.0  --   --   --   PHOS 2.8 2.3* 2.5  --   --   --    GFR: Estimated Creatinine Clearance: 42.8 mL/min (A) (by C-G formula based on SCr of 1.12 mg/dL (H)). Liver Function Tests: Recent Labs  Lab 01/30/21 0509 02/02/21 0605  AST 30 24  ALT 20 17  ALKPHOS 54 55  BILITOT 2.4* 1.3*  PROT 6.4* 6.7  ALBUMIN 2.6* 2.7*   No results for input(s): LIPASE, AMYLASE in the last 168 hours. No results for input(s): AMMONIA in the last 168 hours. Coagulation Profile: Recent Labs  Lab 01/29/21 1336  INR 1.4*   Cardiac Enzymes: No results for input(s): CKTOTAL, CKMB, CKMBINDEX, TROPONINI in the last 168 hours. BNP (last 3 results) No results for input(s): PROBNP in the last 8760 hours. HbA1C: No results for input(s): HGBA1C in the last 72 hours. CBG: No results for input(s): GLUCAP in the last 168 hours. Lipid Profile: No results for input(s): CHOL, HDL, LDLCALC, TRIG, CHOLHDL, LDLDIRECT in the last 72 hours. Thyroid Function Tests: No results for input(s): TSH, T4TOTAL, FREET4, T3FREE, THYROIDAB in the last 72 hours. Anemia Panel: No results for input(s): VITAMINB12, FOLATE, FERRITIN, TIBC, IRON, RETICCTPCT in the last 72 hours. Sepsis Labs: No results for input(s): PROCALCITON, LATICACIDVEN in the last 168 hours.  No results found for this or any previous visit (from the past 240 hour(s)).    Radiology Studies: No results found.  Scheduled Meds:  aspirin  81 mg Oral Daily   Chlorhexidine Gluconate Cloth  6 each Topical Daily   clopidogrel  75 mg Oral Daily   docusate  100 mg Per Tube BID   doxycycline  100 mg Oral Q12H   enoxaparin (LOVENOX) injection  0.5 mg/kg Subcutaneous Q24H   feeding supplement  237 mL Oral TID BM    ipratropium-albuterol  3 mL Nebulization TID   lisinopril  2.5 mg Oral Daily   melatonin  5 mg Oral QHS   methylPREDNISolone (SOLU-MEDROL) injection  40 mg Intravenous Daily   metoprolol succinate  25 mg Oral Daily   multivitamin with minerals  1 tablet Oral Daily   pantoprazole (PROTONIX) IV  40 mg Intravenous Daily   pneumococcal 23 valent vaccine  0.5 mL Intramuscular Tomorrow-1000   polyethylene glycol  17 g Oral Daily   potassium chloride  30 mEq Oral BID   ranolazine  500 mg Oral BID   rifampin  300 mg Oral BID WC   rosuvastatin  40 mg Oral Daily   sodium chloride flush  10-40 mL Intracatheter Q12H   torsemide  40 mg Oral BID   Continuous Infusions:  sodium chloride 250 mL (01/29/21 1103)   promethazine (PHENERGAN) injection (IM or IVPB) Stopped (01/26/21 1902)     LOS: 18 days   Time spent:  35 minutes with >50% on coc   Nolberto Hanlon, MD [facility]  If 7PM-7AM, please contact night-coverage [email or web address]  02/02/2021, 3:54 PM

## 2021-02-02 NOTE — Progress Notes (Signed)
OT Cancellation Note  Patient Details Name: Monica [name] MRN: [id number] DOB: August 02, 1934   Cancelled Treatment:     Per MD, therapy to complete orders secondary to pt's discharge disposition to home with hospice vs hospice house and no further need for skilled therapeutic intervention at this time.  Jackquline Denmark, MS, OTR/L , CBIS ascom [phone]  02/02/21, 10:04 AM    02/02/2021, 10:03 AM

## 2021-02-02 NOTE — Progress Notes (Signed)
Patient had couple bites of magic cup and peppermint patty granddaughter went to get her out side food

## 2021-02-02 NOTE — Progress Notes (Signed)
Physical Therapy Discharge Patient Details Name: Monica [name] MRN: [id number] DOB: 23-Dec-1933 Today's Date: 02/02/2021 Time:  -     Patient discharged from PT services secondary to going hospice house once bed avalible. Per MD, family elects not to have therapy going forward. Progress and discharge plan discussed with patient/MD  GP     Rushie Chestnut 02/02/2021, 10:28 AM

## 2021-02-02 NOTE — TOC Progression Note (Signed)
Transition of Care [facility] - Progression Note    Patient Details  Name: Monica [name] MRN: [id number] Date of Birth: Jun 09, 1934  Transition of Care [facility]) CM/SW Contact  Hetty Ely, RN Phone Number: 02/02/2021, 12:11 PM  Clinical Narrative:  [facility], Karen Kitchens says that they have spoken with family and Hospice Home was decided. Eligibility is pending. I did call patient in room phone and she says that her Son is the best person to talk with.     Expected Discharge Plan: Skilled Nursing Facility Barriers to Discharge: Continued Medical Work up  Expected Discharge Plan and Services Expected Discharge Plan: Skilled Nursing Facility     Post Acute Care Choice: Skilled Nursing Facility Living arrangements for the past 2 months: Single Family Home                                       Social Determinants of Health (SDOH) Interventions    Readmission Risk Interventions No flowsheet data found.

## 2021-02-02 NOTE — Care Management Important Message (Signed)
Important Message  Patient Details  Name: Monica [name] MRN: [id number] Date of Birth: 12-14-1933   Medicare Important Message Given:  Other (see comment)  Plan to discharge to Hospice Home pending eligibility.  Medicare IM withheld at this time.     [name] 02/02/2021, 3:01 PM

## 2021-02-03 DIAGNOSIS — I5020 Unspecified systolic (congestive) heart failure: Secondary | ICD-10-CM

## 2021-02-03 LAB — BASIC METABOLIC PANEL
Anion gap: 7 (ref 5–15)
BUN: 28 mg/dL — ABNORMAL HIGH (ref 8–23)
CO2: 33 mmol/L — ABNORMAL HIGH (ref 22–32)
Calcium: 8.1 mg/dL — ABNORMAL LOW (ref 8.9–10.3)
Chloride: 87 mmol/L — ABNORMAL LOW (ref 98–111)
Creatinine, Ser: 1.17 mg/dL — ABNORMAL HIGH (ref 0.44–1.00)
GFR, Estimated: 46 mL/min — ABNORMAL LOW (ref >60)
Glucose, Bld: 128 mg/dL — ABNORMAL HIGH (ref 70–99)
Potassium: 3.9 mmol/L (ref 3.5–5.1)
Sodium: 127 mmol/L — ABNORMAL LOW (ref 135–145)

## 2021-02-03 MED ORDER — TORSEMIDE 20 MG PO TABS
40.0000 mg | ORAL_TABLET | Freq: Every day | ORAL | Status: DC
  Administered 2021-02-04 – 2021-02-06 (×3): 40 mg via ORAL
  Filled 2021-02-03 (×3): qty 2

## 2021-02-03 NOTE — Progress Notes (Signed)
PROGRESS NOTE    Monica [name]  PJA:[id number] DOB: [birth year] DOA: 01/15/2021 PCP: Patient, No Pcp Per (Inactive)   Brief Narrative:  Monica [name] is a 87 y.o. F with CAD s/p PCI x1 in 2018, severe AS being worked up for TAVR, Obesity, pHTN, and COPD who presented with hip pain.   Had hip fracture after fall in Feb, repaired and discharged home.  Since being home 4 months ago, really never able to walk due to pain. Finally, in the last week before admission, developed redness and worse pain at left hip surgical site, then wound dehisced.   Presented to the ER and Orthopedics were consulted for suspected post-surgical hip infection with MRSA.  ID is recommending 4 weeks of vancomycin and rifampin.    Also noted incidentally to have very large AAA, she was taken to the OR for endovascular repair with vascular surgery today.  Also had severe aortic stenosis and plan is for TAVR work-up after AAA repair, most likely as an outpatient if able to get out of hospital from current admission.  She will not be a good candidate due to multiple comorbidities.  Patient with multiple life-threatening comorbidities-high risk for deterioration and death, palliative care was also consulted, patient is now on DNR/DNI.  She will consider comfort care if deteriorates. At this time we will continue current level of care.  Patient had her endovascular AAA repair done with vascular surgery on 01/24/2021, she remained intubated and in ICU for 2 days after that, extubated yesterday and we will resume care again from today.  Remained on heated high flow, weak cough and appears very lethargic.  6/18: Appears more lethargic with worsening oxygen requirement, today at 50 L and 65% FiO2  6/19: Little improvement in FiO2, started taking few sips here and there.  6/20: Patient remains on heated high flow at 55 L with 50% FiO2.  Developed chest pain which he responded to nitroglycerin.  Troponin elevated at 1374, EKG with  nonspecific T wave changes.  Concern of NSTEMI.  Cardiology was reconsulted, they are recommending conservative management.  She was started on heparin infusion after consulting vascular surgery as she recently had her endovascular AAA repair.  6/21: Echocardiogram with newly reduced EF to 30 to 35% with regional wall motion abnormalities consistent with recent anterior MI.  Not a candidate for cardiac catheterization.  Medical management per cardiology.  Remains on high level of oxygen with heated high flow.  Palliative care again met her but she wants to keep pushing, prognosis is very guarded due to multiple life limiting comorbidities.  Very high risk for deterioration and death.  6/22-spoke to daughter via phone today discussed patient's prognosis.  Still on 9 L high flow.  Today patient was not feeling well and felt she was still short of breath.  After much discussion with daughter, she is agreeable to home with hospice.  We have consulted hospice to evaluate patient.  6/23 when I went to see patient she was on 9 L high flow nasal cannula satting 86%, I increase it to 13 with 02 sat 93%.  She reports less short of breath but talking to her she appears to be still short of breath.  6/24pt without complaints this am. Feels like she is "doing better" .  However requiring 10 L high flow [place].  Also hypotensive while at rest.  Discussed with son about his decision whether to do home hospice or hospice house and they would like to proceed with hospice  house.  6/25-reports "doing better today" . Denies worsening sob. No cp On between 7-11 HFNC  Subjective: No dizziness, no cp   Assessment & Plan:   Active Problems:   Abscess of left hip   Abdominal aortic aneurysm (AAA) without rupture (HCC)   Obesity, Class III, BMI 40-49.9 (morbid obesity) (HCC)   Pre-op evaluation   Shortness of breath   Hypoxia   Non-ST elevation (NSTEMI) myocardial infarction ([place])  Chest pain/NSTEMI.   Concern of  NSTEMI with markedly elevated troponin.  Chest pain responded to nitroglycerin.  History of CAD with PCI to RCA.  Echocardiogram with new onset of reduced EF with regional wall motion abnormalities consistent with recent anterior MI. Cardiology was reconsulted and they are recommending conservative management. Cardiology started her on heparin infusion after consulting vascular surgery. 6/25 continue aspirin, Plavix x12 months  continue Ranexa for antianginal benefit aspirin, Plavix x12 months Completed heparin drip treatment No invasive work-up Continue ACE and beta-blocker if Plan to go to hospice house when bed available per family's request     Postsurgical hip infection S/p debridement of the left hip wound by Dr. Posey Pronto, 6/7 Admitted and taken to OR 6/7 for washout.  Intraoperative cultures growing MRSA in 3/3. ID consulted, recommended treating with 4 weeks antibiotics for presumed MRSA prosthetic infection.  1/2 blood cultures positive for staph epidermidis which was thought to be a contaminant. -Orthopedic remove the suture and drain today-no obvious sign of infection according to their note. 6/25 per ID looks like infection of soft tissue did not do bone as the I&D has healed.   She already received 17 days of IV vancomycin  Can change to p.o. doxycycline  Would stop rifampin on 02/06/2021   Lt brachial access Swelling: LUE swollen around surgical site.  Vascular evaluated this am, likely small hematoma. Palpable radial pulse. No concerns over access site.  Lower abdominal pain.  Operative sites looks okay.   Possibly due to constipation.  Appears to be resolved.  6/25 continue bowel regimen    Severe aortic stenosis Previously known, since hip fracture in Feb.  Was pending TAVR eval prior to this admission, seen by Cardiology here. 6/24 no plans to start TAVR work-up at this time while hospitalized.  And plan is to go to hospice house.   Neurology is following  Chronic  DHF-Lasix was switched to torsemide  Continue beta-blockers  Monitor vitals    Hypokalemia-Will replace potassium Monitor levels   Hypotension due to aortic stenosis Continue midodrine    Acute on chronic hypoxic respiratory failure.  Mild improvement in FiO2 requirement today.  Otherwise remained on heated high flow at 45 L and 50% FiO2. Has chronic hypoxic respiratory failure due to aortic stenosis, heart failure, and pulmonary hypertension.  COPD is in chart but appears to be mild. Former smoker.  Uses 2 L of oxygen at baseline.  Initially requiring 2 to 3 L and then got worse requiring up to 10 L starting from 6/12. Chest x-ray unremarkable, CT angiogram of the chest with patchy subpleural fibrosis, no PE, moderate atelectasis, no edema or large consolidation, no effusion. Patient required prolonged intubation after the procedure, extubated but continued to require very high levels of oxygen. 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  Patient was evaluated by vascular surgery, who recommended urgent repair given the size.  She is not a candidate for open repair.  She was taken to the OR for endovascular repair-tolerated the procedure well, stable from vascular standpoint and will follow-up as an outpatient. -Appreciate vascular help.   Iron deficiency anemia Hgb stable Given Feraheme 6/10 -Transfusion threshold 8 g/dL  Hyponatremia.  Resolved Mild, asymptomatic  Vaginal candidiasis Given Fluconazole 1 dose on 6/10.   Left renal nodule Incidental finding, minor, given gravity of AAA and AS and hypoxia, this likely has not been discussed with patient and should be followed up only depending on goals of care after AAA repair -  Follow-up MRI as an outpatient  Class II obesity. Estimated body mass index is 36.36 kg/m as calculated from the following:   Height as of this encounter: _0  (1.651 m).   Weight as of this encounter: 99.1 kg.   Goals of care.hospice to evaluate patient for home hospice versus hospice house  Objective: Vitals:   02/02/21 1957 02/02/21 2032 02/03/21 0528 02/03/21 0737  BP:  (!) 100/54 107/61   Pulse:  85 85   Resp:  20 19   Temp:  97.6 F (36.4 C) 97.9 F (36.6 C)   TempSrc:  Oral Oral   SpO2: 95% 92% 95% 92%  Weight:      Height:        Intake/Output Summary (Last 24 hours) at 02/03/2021 9528 Last data filed at 02/03/2021 4132 Gross per 24 hour  Intake --  Output 1130 ml  Net -1130 ml   Filed Weights   01/30/21 0500 01/31/21 1100 02/01/21 0649  Weight: 98.5 kg 97.4 kg 99.1 kg    Examination: Calm, nad Decrease bs , no wheezing Regular s1/s2 no gallop Soft benign +bs No edema Awake and alert Mood and affect appropriate in current setting   DVT prophylaxis: Lovenox Code Status: DNR Family Communication: none at bedside Disposition Plan:  Status is: Inpatient  Remains inpatient appropriate because:Inpatient level of care appropriate due to severity of illness  Dispo: The patient is from:  Home              Anticipated d/c is to:  To be determined              Patient currently is not medically stable to d/c.   Difficult to place patient No                Level of care: Progressive Cardiac  Consultants:  Vascular surgery Pulmonology Cardiology  Procedures:  Antimicrobials:  Vancomycin..>dc 6/24 Rifampin>> Doxycycline 6/24>>>  Data Reviewed: I have personally reviewed following labs and imaging studies  CBC: Recent Labs  Lab 01/28/21 0500 01/29/21 0455 01/30/21 0509 01/31/21 0404 02/01/21 0630  WBC 8.8 9.5 7.9 9.1 8.1  HGB 11.0* 11.6* 10.7* 10.9* 10.4*  HCT 34.8* 36.4 33.1* 33.9* 32.5*  MCV 77.0* 74.7* 75.7* 75.0* 76.8*  PLT 175 212 243  288 440   Basic Metabolic Panel: Recent Labs  Lab 01/28/21 0500 01/29/21 0455 01/31/21 0757 02/01/21 0630 02/02/21 0605 02/03/21 0530  NA 135 130*  --   --  130* 127*  K 3.3* 3.7  --   --  3.2* 3.9  CL 98 93*  --   --  90* 87*  CO2 29 28  --   --  30 33*  GLUCOSE 122* 132*  --   --  114* 128*  BUN 28* 26*  --   --  28* 28*  CREATININE 0.98 0.91 1.03* 1.02* 1.12* 1.17*  CALCIUM 8.3* 8.3*  --   --  8.0* 8.1*  MG 2.2 2.0  --   --   --   --   PHOS 2.3* 2.5  --   --   --   --    GFR: Estimated Creatinine Clearance: 41 mL/min (A) (by C-G formula based on SCr of 1.17 mg/dL (H)). Liver Function Tests: Recent Labs  Lab 01/30/21 0509 02/02/21 0605  AST 30 24  ALT 20 17  ALKPHOS 54 55  BILITOT 2.4* 1.3*  PROT 6.4* 6.7  ALBUMIN 2.6* 2.7*   No results for input(s): LIPASE, AMYLASE in the last 168 hours. No results for input(s): AMMONIA in the last 168 hours. Coagulation Profile: Recent Labs  Lab 01/29/21 1336  INR 1.4*   Cardiac Enzymes: No results for input(s): CKTOTAL, CKMB, CKMBINDEX, TROPONINI in the last 168 hours. BNP (last 3 results) No results for input(s): PROBNP in the last 8760 hours. HbA1C: No results for input(s): HGBA1C in the last 72 hours. CBG: No results for input(s): GLUCAP in the last 168 hours. Lipid Profile: No results for input(s): CHOL, HDL, LDLCALC, TRIG, CHOLHDL, LDLDIRECT in the last 72 hours. Thyroid Function Tests: No results for input(s): TSH, T4TOTAL, FREET4, T3FREE, THYROIDAB in the last 72 hours. Anemia Panel: No results for input(s): VITAMINB12, FOLATE, FERRITIN, TIBC, IRON, RETICCTPCT in the last 72 hours. Sepsis Labs: No results for input(s): PROCALCITON, LATICACIDVEN in the last 168 hours.  No results found for this or any previous visit (from the past 240 hour(s)).    Radiology Studies: No results found.  Scheduled Meds:  aspirin  81 mg Oral Daily   Chlorhexidine Gluconate Cloth  6 each Topical Daily   clopidogrel  75 mg  Oral Daily   docusate  100 mg Per Tube BID   doxycycline  100 mg Oral Q12H   enoxaparin (LOVENOX) injection  0.5 mg/kg Subcutaneous Q24H   feeding supplement  237 mL Oral TID BM   ipratropium-albuterol  3 mL Nebulization TID   lisinopril  2.5 mg Oral Daily   melatonin  5 mg Oral QHS   methylPREDNISolone (SOLU-MEDROL) injection  40 mg Intravenous Daily   metoprolol succinate  25 mg Oral Daily   multivitamin with minerals  1 tablet Oral Daily   pantoprazole (PROTONIX) IV  40 mg Intravenous Daily   pneumococcal 23 valent vaccine  0.5 mL Intramuscular Tomorrow-1000   polyethylene glycol  17 g Oral Daily   ranolazine  500 mg Oral BID   rifampin  300 mg Oral BID WC   rosuvastatin  40 mg Oral Daily   sodium chloride flush  10-40 mL Intracatheter Q12H   [START ON 02/04/2021] torsemide  40 mg Oral Daily   Continuous Infusions:  sodium chloride 250 mL (01/29/21 1103)   promethazine (PHENERGAN) injection (IM or IVPB) Stopped (01/26/21 1902)     LOS: 19 days   Time spent:  35 minutes with >50% on coc   Nolberto Hanlon, MD [facility]  If 7PM-7AM, please contact night-coverage [email or web address]  02/03/2021, 8:19 AM

## 2021-02-03 NOTE — Progress Notes (Signed)
Wound dressings dry. Patient comfortable.

## 2021-02-03 NOTE — Progress Notes (Signed)
Progress Note  Patient Name: Monica [name] Date of Encounter: 02/03/2021  [facility] Cardiologist: Lorine Bears, MD   Subjective   Seen this morning eating breakfast, denies chest pain, shortness of breath or palpitations.  Has not moved much.  Inpatient Medications    Scheduled Meds:  aspirin  81 mg Oral Daily   Chlorhexidine Gluconate Cloth  6 each Topical Daily   clopidogrel  75 mg Oral Daily   docusate  100 mg Per Tube BID   doxycycline  100 mg Oral Q12H   enoxaparin (LOVENOX) injection  0.5 mg/kg Subcutaneous Q24H   feeding supplement  237 mL Oral TID BM   ipratropium-albuterol  3 mL Nebulization TID   lisinopril  2.5 mg Oral Daily   melatonin  5 mg Oral QHS   methylPREDNISolone (SOLU-MEDROL) injection  40 mg Intravenous Daily   metoprolol succinate  25 mg Oral Daily   multivitamin with minerals  1 tablet Oral Daily   pantoprazole (PROTONIX) IV  40 mg Intravenous Daily   pneumococcal 23 valent vaccine  0.5 mL Intramuscular Tomorrow-1000   polyethylene glycol  17 g Oral Daily   ranolazine  500 mg Oral BID   rifampin  300 mg Oral BID WC   rosuvastatin  40 mg Oral Daily   sodium chloride flush  10-40 mL Intracatheter Q12H   [START ON 02/04/2021] torsemide  40 mg Oral Daily   Continuous Infusions:  sodium chloride 250 mL (01/29/21 1103)   promethazine (PHENERGAN) injection (IM or IVPB) Stopped (01/26/21 1902)   PRN Meds: bisacodyl, diazepam, ipratropium-albuterol, lactulose, lip balm, [DISCONTINUED] metoCLOPramide **OR** metoCLOPramide (REGLAN) injection, morphine injection, nitroGLYCERIN, [DISCONTINUED] promethazine **OR** promethazine (PHENERGAN) injection (IM or IVPB) **OR** promethazine, sodium chloride flush   Vital Signs    Vitals:   02/03/21 0528 02/03/21 0737 02/03/21 0822 02/03/21 0900  BP: 107/61  (!) 93/46   Pulse: 85  85   Resp: 19  19   Temp: 97.9 F (36.6 C)  97.8 F (36.6 C)   TempSrc: Oral  Oral   SpO2: 95% 92% 94% 96%  Weight:       Height:        Intake/Output Summary (Last 24 hours) at 02/03/2021 1324 Last data filed at 02/03/2021 0528 Gross per 24 hour  Intake --  Output 1130 ml  Net -1130 ml   Last 3 Weights 02/01/2021 01/31/2021 01/30/2021  Weight (lbs) 218 lb 7.6 oz 214 lb 11.7 oz 217 lb 2.5 oz  Weight (kg) 99.1 kg 97.4 kg 98.5 kg      Telemetry    Sinus rhythm, heart rate 95- Personally Reviewed  ECG    No new tracing obtained  Physical Exam   GEN:  Well nourished, mild distress.  HEENT: Normal  CARDIAC: RRR, 3/6 systolic murmur  RESPIRATORY: Diminished breath sounds at bases  ABDOMEN: Soft, non-tender, non-distended  MUSCULOSKELETAL:   1+ edema; No deformity   SKIN: Warm and dry  NEUROLOGIC:  Alert and oriented x 3  PSYCHIATRIC:   Normal affect    Labs    High Sensitivity Troponin:   Recent Labs  Lab 01/20/21 1336 01/20/21 1515 01/29/21 1054 01/29/21 1241  TROPONINIHS 11 12 1,374* 1,349*      Chemistry Recent Labs  Lab 01/29/21 0455 01/30/21 0509 01/31/21 0757 02/01/21 0630 02/02/21 0605 02/03/21 0530  NA 130*  --   --   --  130* 127*  K 3.7  --   --   --  3.2* 3.9  CL 93*  --   --   --  90* 87*  CO2 28  --   --   --  30 33*  GLUCOSE 132*  --   --   --  114* 128*  BUN 26*  --   --   --  28* 28*  CREATININE 0.91  --    < > 1.02* 1.12* 1.17*  CALCIUM 8.3*  --   --   --  8.0* 8.1*  PROT  --  6.4*  --   --  6.7  --   ALBUMIN  --  2.6*  --   --  2.7*  --   AST  --  30  --   --  24  --   ALT  --  20  --   --  17  --   ALKPHOS  --  54  --   --  55  --   BILITOT  --  2.4*  --   --  1.3*  --   GFRNONAA >60  --    < > 54* 48* 46*  ANIONGAP 9  --   --   --  10 7   < > = values in this interval not displayed.     Hematology Recent Labs  Lab 01/30/21 0509 01/31/21 0404 02/01/21 0630  WBC 7.9 9.1 8.1  RBC 4.37 4.52 4.23  HGB 10.7* 10.9* 10.4*  HCT 33.1* 33.9* 32.5*  MCV 75.7* 75.0* 76.8*  MCH 24.5* 24.1* 24.6*  MCHC 32.3 32.2 32.0  RDW 20.6* 21.0* 21.7*  PLT 243  288 272    BNP Recent Labs  Lab 01/31/21 1618  BNP 1,191.7*     DDimer No results for input(s): DDIMER in the last 168 hours.   Radiology    No results found.  Cardiac Studies   01/29/2021 1. Left ventricular ejection fraction, by estimation, is 30 to 35%. The  left ventricle has moderately decreased function. The left ventricle  demonstrates regional wall motion abnormalities (see scoring  diagram/findings for description). There is  moderate left ventricular hypertrophy. Left ventricular diastolic  parameters are indeterminate. There is severe hypokinesis of the left  ventricular, mid-apical anteroseptal wall, anterior wall, anterolateral  wall and apical segment.   2. Right ventricular systolic function is normal. The right ventricular  size is mildly enlarged. There is moderately elevated pulmonary artery  systolic pressure.   3. Left atrial size was mildly dilated.   4. The mitral valve is degenerative. Mild mitral valve regurgitation.  Mild mitral stenosis. Moderate mitral annular calcification.   5. The aortic valve is tricuspid. There is moderate calcification of the  aortic valve. There is severe thickening of the aortic valve. Aortic valve  regurgitation is trivial. Severe aortic valve stenosis. Aortic valve area,  by VTI measures 0.54 cm.  Aortic valve mean gradient measures 32.0 mmHg.   6. The inferior vena cava is normal in size with greater than 50%  respiratory variability, suggesting right atrial pressure of 3 mmHg.   Patient Profile     87 y.o. female with history of CAD/PCI to RCA, severe aortic stenosis presenting with left hip pain, diagnosed with left hip abscess s/p I&D.  AAA noted on abdominal CT, s/p endovascular repair.  Hospital course complicated by newly found severely reduced ejection fraction, EF 30-35%.  Assessment & Plan    NSTEMI, history of CAD/PCI -s/p heparin drip x48 hours -Medical management with aspirin, Plavix x12  months. -Continue Ranexa  for antianginal benefit -Comorbidities, including oxygen requirement, hip abscess preventing invasive work-up.  HFrEF, EF 30 to 35% -Continue Toprol-XL, lisinopril 2.5 mg daily -Creatinine elevated, decrease torsemide to 40 mg daily.  3.  Severe AS -Outpatient TAVR consideration.  Likely not a great candidate.  4.  AAA, status post endovascular repair -Access site with small hematoma.  Total encounter time 35 minutes  Greater than 50% was spent in counseling and coordination of care with the patient   Signed, Debbe Odea, MD  02/03/2021, 1:24 PM

## 2021-02-03 NOTE — Progress Notes (Signed)
Subjective  -   S/p EVAR with renal snorkel. Asked to evaluate left brachial access   Physical Exam:  Left brachial access with small hematoma.  Remains soft, non-tender.  Palpable radial pulse       Assessment/Plan:   Stable from vascular perspective.  No concerns over brachial access site  Wells Raden Byington 02/03/2021 1:14 PM --  Vitals:   02/03/21 0822 02/03/21 0900  BP: (!) 93/46   Pulse: 85   Resp: 19   Temp: 97.8 F (36.6 C)   SpO2: 94% 96%    Intake/Output Summary (Last 24 hours) at 02/03/2021 1314 Last data filed at 02/03/2021 0528 Gross per 24 hour  Intake --  Output 1130 ml  Net -1130 ml     Laboratory CBC    Component Value Date/Time   WBC 8.1 02/01/2021 0630   HGB 10.4 (L) 02/01/2021 0630   HCT 32.5 (L) 02/01/2021 0630   PLT 272 02/01/2021 0630    BMET    Component Value Date/Time   NA 127 (L) 02/03/2021 0530   K 3.9 02/03/2021 0530   CL 87 (L) 02/03/2021 0530   CO2 33 (H) 02/03/2021 0530   GLUCOSE 128 (H) 02/03/2021 0530   BUN 28 (H) 02/03/2021 0530   CREATININE 1.17 (H) 02/03/2021 0530   CALCIUM 8.1 (L) 02/03/2021 0530   GFRNONAA 46 (L) 02/03/2021 0530   GFRAA >60 05/02/2018 0336    COAG Lab Results  Component Value Date   INR 1.4 (H) 01/29/2021   INR 1.1 10/08/2020   INR 1.1 10/02/2020   No results found for: PTT  Antibiotics Anti-infectives (From admission, onward)    Start     Dose/Rate Route Frequency Ordered Stop   02/02/21 2200  doxycycline (VIBRA-TABS) tablet 100 mg        100 mg Oral Every 12 hours 02/02/21 1428     02/01/21 1600  vancomycin (VANCOREADY) IVPB 750 mg/150 mL  Status:  Discontinued        750 mg 150 mL/hr over 60 Minutes Intravenous Every 24 hours 02/01/21 1409 02/02/21 1427   01/29/21 1000  vancomycin (VANCOCIN) IVPB 1000 mg/200 mL premix  Status:  Discontinued        1,000 mg 200 mL/hr over 60 Minutes Intravenous Every 24 hours 01/28/21 1543 02/01/21 1409   01/28/21 0915  vancomycin  (VANCOREADY) IVPB 1000 mg/200 mL  Status:  Discontinued        1,000 mg 200 mL/hr over 60 Minutes Intravenous Every 24 hours 01/28/21 0819 01/28/21 1543   01/28/21 0900  vancomycin (VANCOCIN) 1,000 mg in sodium chloride 0.9 % 250 mL IVPB  Status:  Discontinued        1,000 mg 125 mL/hr over 120 Minutes Intravenous Every 24 hours 01/28/21 0806 01/28/21 0819   01/27/21 0915  vancomycin (VANCOREADY) IVPB 1000 mg/200 mL        1,000 mg 200 mL/hr over 60 Minutes Intravenous  Once 01/27/21 0819 01/27/21 1046   01/27/21 0900  vancomycin (VANCOREADY) IVPB 1000 mg/200 mL  Status:  Discontinued        1,000 mg 200 mL/hr over 60 Minutes Intravenous  Once 01/27/21 0753 01/27/21 0803   01/27/21 0900  vancomycin (VANCOCIN) 1,000 mg in sodium chloride 0.9 % 250 mL IVPB  Status:  Discontinued        1,000 mg 250 mL/hr over 60 Minutes Intravenous  Once 01/27/21 0803 01/27/21 0818   01/26/21 1200  vancomycin (VANCOREADY) IVPB 1000 mg/200 mL  Status:  Discontinued        1,000 mg 200 mL/hr over 60 Minutes Intravenous  Once 01/26/21 1002 01/26/21 1007   01/26/21 1200  vancomycin (VANCOCIN) IVPB 1000 mg/200 mL premix        1,000 mg 200 mL/hr over 60 Minutes Intravenous  Once 01/26/21 1006 01/26/21 2032   01/25/21 2153  vancomycin variable dose per unstable renal function (pharmacist dosing)  Status:  Discontinued         Does not apply See admin instructions 01/25/21 2154 01/28/21 0806   01/24/21 1102  ceFAZolin (ANCEF) 2-4 GM/100ML-% IVPB       Note to Pharmacy: Rita Ohara   : cabinet override      01/24/21 1102 01/24/21 1406   01/24/21 0600  ceFAZolin (ANCEF) IVPB 2g/100 mL premix        2 g 200 mL/hr over 30 Minutes Intravenous On call to O.R. 01/23/21 1758 01/24/21 1354   01/23/21 1700  rifampin (RIFADIN) capsule 300 mg        300 mg Oral 2 times daily with meals 01/23/21 1555     01/19/21 2200  rifampin (RIFADIN) capsule 300 mg  Status:  Discontinued        300 mg Oral 2 times daily 01/19/21  1356 01/23/21 1555   01/19/21 1830  fluconazole (DIFLUCAN) tablet 150 mg        150 mg Oral  Once 01/19/21 1743 01/19/21 1840   01/17/21 1200  ceFEPIme (MAXIPIME) 2 g in sodium chloride 0.9 % 100 mL IVPB  Status:  Discontinued        2 g 200 mL/hr over 30 Minutes Intravenous Every 12 hours 01/17/21 0824 01/18/21 1158   01/16/21 1900  vancomycin (VANCOREADY) IVPB 1250 mg/250 mL  Status:  Discontinued        1,250 mg 166.7 mL/hr over 90 Minutes Intravenous Every 24 hours 01/15/21 2318 01/25/21 2153   01/16/21 0100  ceFEPIme (MAXIPIME) 2 g in sodium chloride 0.9 % 100 mL IVPB  Status:  Discontinued        2 g 200 mL/hr over 30 Minutes Intravenous Every 8 hours 01/15/21 2310 01/17/21 0824   01/16/21 0000  vancomycin (VANCOREADY) IVPB 1000 mg/200 mL  Status:  Discontinued        1,000 mg 200 mL/hr over 60 Minutes Intravenous  Once 01/15/21 2310 01/15/21 2313   01/16/21 0000  vancomycin (VANCOREADY) IVPB 1500 mg/300 mL        1,500 mg 150 mL/hr over 120 Minutes Intravenous  Once 01/15/21 2313 01/16/21 0247   01/15/21 1845  vancomycin (VANCOCIN) IVPB 1000 mg/200 mL premix        1,000 mg 200 mL/hr over 60 Minutes Intravenous  Once 01/15/21 1841 01/15/21 2023   01/15/21 1845  piperacillin-tazobactam (ZOSYN) IVPB 3.375 g        3.375 g 100 mL/hr over 30 Minutes Intravenous  Once 01/15/21 1841 01/15/21 1922        V. Charlena Cross, M.D., [facility] [place] Office: [phone] Pager:  [phone]

## 2021-02-04 LAB — BASIC METABOLIC PANEL
Anion gap: 12 (ref 5–15)
BUN: 29 mg/dL — ABNORMAL HIGH (ref 8–23)
CO2: 28 mmol/L (ref 22–32)
Calcium: 8.5 mg/dL — ABNORMAL LOW (ref 8.9–10.3)
Chloride: 89 mmol/L — ABNORMAL LOW (ref 98–111)
Creatinine, Ser: 1.24 mg/dL — ABNORMAL HIGH (ref 0.44–1.00)
GFR, Estimated: 43 mL/min — ABNORMAL LOW (ref >60)
Glucose, Bld: 118 mg/dL — ABNORMAL HIGH (ref 70–99)
Potassium: 4.1 mmol/L (ref 3.5–5.1)
Sodium: 129 mmol/L — ABNORMAL LOW (ref 135–145)

## 2021-02-04 LAB — SODIUM: Sodium: 130 mmol/L — ABNORMAL LOW (ref 135–145)

## 2021-02-04 LAB — GLUCOSE, CAPILLARY: Glucose-Capillary: 126 mg/dL — ABNORMAL HIGH (ref 70–99)

## 2021-02-04 MED ORDER — DOCUSATE SODIUM 100 MG PO CAPS
100.0000 mg | ORAL_CAPSULE | Freq: Two times a day (BID) | ORAL | Status: DC
  Administered 2021-02-04 – 2021-02-10 (×13): 100 mg via ORAL
  Filled 2021-02-04 (×12): qty 1

## 2021-02-04 MED ORDER — GUAIFENESIN-DM 100-10 MG/5ML PO SYRP
5.0000 mL | ORAL_SOLUTION | ORAL | Status: DC | PRN
  Administered 2021-02-04 (×2): 5 mL via ORAL
  Filled 2021-02-04 (×2): qty 5

## 2021-02-04 MED ORDER — SALINE SPRAY 0.65 % NA SOLN
1.0000 | NASAL | Status: DC | PRN
  Administered 2021-02-04: 1 via NASAL
  Filled 2021-02-04: qty 44

## 2021-02-04 NOTE — Progress Notes (Signed)
Pt unable to have bowel movement in several days, complains of abd pain gave pt prune juice, enema and got her to bedside commode. She was given all scheduled stool softeners was still unable to have BM. Doctor updated

## 2021-02-04 NOTE — Progress Notes (Signed)
PROGRESS NOTE    Monica [name]  TDD:[id number] DOB: 05-Apr-1934 DOA: 01/15/2021 PCP: Patient, No Pcp Per (Inactive)   Brief Narrative:  Monica [name] is a 87 y.o. F with CAD s/p PCI x1 in 2018, severe AS being worked up for TAVR, Obesity, pHTN, and COPD who presented with hip pain.   Had hip fracture after fall in Feb, repaired and discharged home.  Since being home 4 months ago, really never able to walk due to pain. Finally, in the last week before admission, developed redness and worse pain at left hip surgical site, then wound dehisced.   Presented to the ER and Orthopedics were consulted for suspected post-surgical hip infection with MRSA.  ID is recommending 4 weeks of vancomycin and rifampin.    Also noted incidentally to have very large AAA, she was taken to the OR for endovascular repair with vascular surgery today.  Also had severe aortic stenosis and plan is for TAVR work-up after AAA repair, most likely as an outpatient if able to get out of hospital from current admission.  She will not be a good candidate due to multiple comorbidities.  Patient with multiple life-threatening comorbidities-high risk for deterioration and death, palliative care was also consulted, patient is now on DNR/DNI.  She will consider comfort care if deteriorates. At this time we will continue current level of care.  Patient had her endovascular AAA repair done with vascular surgery on 01/24/2021, she remained intubated and in ICU for 2 days after that, extubated yesterday and we will resume care again from today.  Remained on heated high flow, weak cough and appears very lethargic.  6/18: Appears more lethargic with worsening oxygen requirement, today at 50 L and 65% FiO2  6/19: Little improvement in FiO2, started taking few sips here and there.  6/20: Patient remains on heated high flow at 55 L with 50% FiO2.  Developed chest pain which he responded to nitroglycerin.  Troponin elevated at 1374, EKG with  nonspecific T wave changes.  Concern of NSTEMI.  Cardiology was reconsulted, they are recommending conservative management.  She was started on heparin infusion after consulting vascular surgery as she recently had her endovascular AAA repair.  6/21: Echocardiogram with newly reduced EF to 30 to 35% with regional wall motion abnormalities consistent with recent anterior MI.  Not a candidate for cardiac catheterization.  Medical management per cardiology.  Remains on high level of oxygen with heated high flow.  Palliative care again met her but she wants to keep pushing, prognosis is very guarded due to multiple life limiting comorbidities.  Very high risk for deterioration and death.  6/22-spoke to daughter via phone today discussed patient's prognosis.  Still on 9 L high flow.  Today patient was not feeling well and felt she was still short of breath.  After much discussion with daughter, she is agreeable to home with hospice.  We have consulted hospice to evaluate patient.  6/23 when I went to see patient she was on 9 L high flow nasal cannula satting 86%, I increase it to 13 with 02 sat 93%.  She reports less short of breath but talking to her she appears to be still short of breath.  6/24pt without complaints this am. Feels like she is "doing better" .  However requiring 10 L high flow [place].  Also hypotensive while at rest.  Discussed with son about his decision whether to do home hospice or hospice house and they would like to proceed with hospice  house.  6/25-reports "doing better today" . Denies worsening sob. No cp On between 7-11 HFNC 6/26- while talking to me pt o2 sat dropped on 9 L to 86%, had to increase it to 11L with 02 sat 90%  Subjective: Reports does not think has sob. "Reports feeling ok. "No cp. Feels constipated.   Assessment & Plan:   Active Problems:   Abscess of left hip   Abdominal aortic aneurysm (AAA) without rupture (HCC)   Obesity, Class III, BMI 40-49.9 (morbid  obesity) (HCC)   Pre-op evaluation   Shortness of breath   Hypoxia   Non-ST elevation (NSTEMI) myocardial infarction (HCC)   HFrEF (heart failure with reduced ejection fraction) ([place])  Chest pain/NSTEMI.   Concern of NSTEMI with markedly elevated troponin.  Chest pain responded to nitroglycerin.  History of CAD with PCI to RCA.  Echocardiogram with new onset of reduced EF with regional wall motion abnormalities consistent with recent anterior MI. Cardiology was reconsulted and they are recommending conservative management. Cardiology started her on heparin infusion after consulting vascular surgery. 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 Up Continue toprol xl. Lisinopril 2.571mqd   Severe aortic stenosis Previously known, since hip fracture in Feb.  Was pending TAVR eval prior to this admission, seen by Cardiology here. 6/26- TAVR outpt considered, but likely not a great candidate.  Cardiology following   Lt brachial access Swelling: LUE swollen around surgical site.  6/26-vascular evaluated this, likely small hematoma.  Palpable radial pulse.  No concerns over access site     Postsurgical hip infection S/p debridement of the left hip wound by Dr. PaPosey Pronto6/7 Admitted and taken to OR 6/7 for washout.  Intraoperative cultures growing MRSA in 3/3. ID consulted, recommended treating with 4 weeks antibiotics for presumed MRSA prosthetic infection.  1/2 blood cultures positive for staph epidermidis which was thought to be a contaminant. -Orthopedic remove the suture and drain today-no obvious sign of infection according to their note. 6/25 per  ID looks like infection of soft tissue did not do bone as the I&D has healed.   She already received 17 days of IV vancomycin  Can change to p.o. doxycycline  Would stop rifampin on 02/06/2021   Lower abdominal pain.  Operative sites looks okay.   Possibly due to constipation.   6/26 continue bowel regimen to prevent constipation   Hypokalemia-Will replace potassium Monitor levels   Hypotension due to aortic stenosis Continue midodrine    Acute on chronic hypoxic respiratory failure.  Mild improvement in FiO2 requirement today.  Otherwise remained on heated high flow at 45 L and 50% FiO2. Has chronic hypoxic respiratory failure due to aortic stenosis, heart failure, and pulmonary hypertension.  COPD is in chart but appears to be mild. Former smoker.  Uses 2 L of oxygen at baseline.  Initially requiring 2 to 3 L and then got worse requiring up to 10 L starting from 6/12. Chest x-ray unremarkable, CT angiogram of the chest with patchy subpleural fibrosis, no PE, moderate atelectasis, no edema or large consolidation, no effusion. Patient required prolonged intubation after the procedure, extubated but continued to require very high levels of oxygen. 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  Patient was evaluated by vascular surgery, who recommended urgent repair given the size.  She is not a candidate for open repair.  She was taken to the OR for endovascular repair-tolerated the procedure well, stable from vascular standpoint and will follow-up as an outpatient. Vascular was following.   Iron deficiency anemia Hgb stable Given Feraheme 6/10 -Transfusion threshold 8 g/dL  Hyponatremia.   Mild, asymptomatic  Vaginal candidiasis Given  Fluconazole 1 dose on 6/10.   Left renal nodule Incidental finding, minor, given gravity of AAA and AS and hypoxia, this likely has not been discussed with patient and should be followed up only depending on goals of care after AAA repair - Follow-up MRI as an outpatient  Class II obesity. Estimated body mass index is 36.36 kg/m as calculated from the following:   Height as of this encounter: _0  (1.651 m).   Weight as of this encounter: 99.1 kg.   Goals of care.hospice to evaluate patient for home hospice versus hospice house  Objective: Vitals:   02/03/21 1957 02/03/21 2008 02/04/21 0750 02/04/21 0801  BP:  (!) 110/35 127/73   Pulse:  88 93   Resp:  18 15   Temp:  97.9 F (36.6 C) 98 F (36.7 C)   TempSrc:  Axillary Oral   SpO2: 92% 98% 97% 96%  Weight:      Height:        Intake/Output Summary (Last 24 hours) at 02/04/2021 0823 Last data filed at 02/04/2021 0500 Gross per 24 hour  Intake 10 ml  Output 600 ml  Net -590 ml   Filed Weights   01/30/21 0500 01/31/21 1100 02/01/21 0649  Weight: 98.5 kg 97.4 kg 99.1 kg    Examination: Calm, NAD Clear anteriorly with decreased breath sounds at bases Regular S1-S2 no gallop Soft benign positive bowel sounds Right lower extremity no edema.  Minimal trace left lower extremity edema Awake and alert, mood and affect appropriate in current setting  DVT prophylaxis: Lovenox Code Status: DNR Family Communication: none at bedside Disposition Plan:  Status is: Inpatient  Remains inpatient appropriate because:Inpatient level of care appropriate due to severity of illness  Dispo: The patient is from:  Home              Anticipated d/c is to:  To be determined              Patient currently is not medically stable to d/c.   Difficult to place patient No                Level of care: Progressive Cardiac  Consultants:  Vascular surgery Pulmonology Cardiology  Procedures:  Antimicrobials:  Vancomycin..>dc  6/24 Rifampin>> Doxycycline 6/24>>>  Data Reviewed: I have personally reviewed following labs and imaging studies  CBC: Recent Labs  Lab 01/29/21 0455 01/30/21 0509 01/31/21 0404 02/01/21 0630  WBC 9.5 7.9 9.1 8.1  HGB 11.6* 10.7* 10.9* 10.4*  HCT 36.4 33.1* 33.9* 32.5*  MCV 74.7* 75.7* 75.0* 76.8*  PLT 212 243 288 893   Basic Metabolic Panel: Recent Labs  Lab 01/29/21 0455 01/31/21 0757 02/01/21 0630 02/02/21 0605 02/03/21 0530 02/04/21 0537  NA 130*  --   --  130* 127* 130*  K  3.7  --   --  3.2* 3.9  --   CL 93*  --   --  90* 87*  --   CO2 28  --   --  30 33*  --   GLUCOSE 132*  --   --  114* 128*  --   BUN 26*  --   --  28* 28*  --   CREATININE 0.91 1.03* 1.02* 1.12* 1.17*  --   CALCIUM 8.3*  --   --  8.0* 8.1*  --   MG 2.0  --   --   --   --   --   PHOS 2.5  --   --   --   --   --    GFR: Estimated Creatinine Clearance: 41 mL/min (A) (by C-G formula based on SCr of 1.17 mg/dL (H)). Liver Function Tests: Recent Labs  Lab 01/30/21 0509 02/02/21 0605  AST 30 24  ALT 20 17  ALKPHOS 54 55  BILITOT 2.4* 1.3*  PROT 6.4* 6.7  ALBUMIN 2.6* 2.7*   No results for input(s): LIPASE, AMYLASE in the last 168 hours. No results for input(s): AMMONIA in the last 168 hours. Coagulation Profile: Recent Labs  Lab 01/29/21 1336  INR 1.4*   Cardiac Enzymes: No results for input(s): CKTOTAL, CKMB, CKMBINDEX, TROPONINI in the last 168 hours. BNP (last 3 results) No results for input(s): PROBNP in the last 8760 hours. HbA1C: No results for input(s): HGBA1C in the last 72 hours. CBG: No results for input(s): GLUCAP in the last 168 hours. Lipid Profile: No results for input(s): CHOL, HDL, LDLCALC, TRIG, CHOLHDL, LDLDIRECT in the last 72 hours. Thyroid Function Tests: No results for input(s): TSH, T4TOTAL, FREET4, T3FREE, THYROIDAB in the last 72 hours. Anemia Panel: No results for input(s): VITAMINB12, FOLATE, FERRITIN, TIBC, IRON, RETICCTPCT in the last 72  hours. Sepsis Labs: No results for input(s): PROCALCITON, LATICACIDVEN in the last 168 hours.  No results found for this or any previous visit (from the past 240 hour(s)).    Radiology Studies: No results found.  Scheduled Meds:  aspirin  81 mg Oral Daily   Chlorhexidine Gluconate Cloth  6 each Topical Daily   clopidogrel  75 mg Oral Daily   docusate  100 mg Per Tube BID   doxycycline  100 mg Oral Q12H   enoxaparin (LOVENOX) injection  0.5 mg/kg Subcutaneous Q24H   feeding supplement  237 mL Oral TID BM   ipratropium-albuterol  3 mL Nebulization TID   lisinopril  2.5 mg Oral Daily   melatonin  5 mg Oral QHS   methylPREDNISolone (SOLU-MEDROL) injection  40 mg Intravenous Daily   metoprolol succinate  25 mg Oral Daily   multivitamin with minerals  1 tablet Oral Daily   pantoprazole (PROTONIX) IV  40 mg Intravenous Daily   pneumococcal 23 valent vaccine  0.5 mL Intramuscular Tomorrow-1000   polyethylene glycol  17 g Oral Daily   ranolazine  500 mg Oral BID   rifampin  300 mg Oral BID WC   rosuvastatin  40 mg Oral Daily   sodium chloride flush  10-40 mL Intracatheter Q12H   torsemide  40 mg Oral Daily   Continuous Infusions:  sodium chloride 250 mL (01/29/21 1103)   promethazine (PHENERGAN) injection (IM or IVPB) Stopped (01/26/21 1902)     LOS: 20 days   Time spent:  35 minutes with >50% on coc   Nolberto Hanlon, MD [facility]  If 7PM-7AM, please contact  night-coverage [email or web address]  02/04/2021, 8:23 AM

## 2021-02-04 NOTE — Progress Notes (Signed)
Progress Note  Patient Name: Monica [name] Date of Encounter: 02/04/2021  [facility] Cardiologist: Lorine Bears, MD   Subjective   Feels well, no acute events overnight, still has not moved much.  Planning on working with PT today.  Inpatient Medications    Scheduled Meds:  aspirin  81 mg Oral Daily   Chlorhexidine Gluconate Cloth  6 each Topical Daily   clopidogrel  75 mg Oral Daily   docusate sodium  100 mg Oral BID   doxycycline  100 mg Oral Q12H   enoxaparin (LOVENOX) injection  0.5 mg/kg Subcutaneous Q24H   feeding supplement  237 mL Oral TID BM   ipratropium-albuterol  3 mL Nebulization TID   lisinopril  2.5 mg Oral Daily   melatonin  5 mg Oral QHS   methylPREDNISolone (SOLU-MEDROL) injection  40 mg Intravenous Daily   metoprolol succinate  25 mg Oral Daily   multivitamin with minerals  1 tablet Oral Daily   pantoprazole (PROTONIX) IV  40 mg Intravenous Daily   pneumococcal 23 valent vaccine  0.5 mL Intramuscular Tomorrow-1000   polyethylene glycol  17 g Oral Daily   ranolazine  500 mg Oral BID   rifampin  300 mg Oral BID WC   rosuvastatin  40 mg Oral Daily   sodium chloride flush  10-40 mL Intracatheter Q12H   torsemide  40 mg Oral Daily   Continuous Infusions:  sodium chloride 250 mL (01/29/21 1103)   promethazine (PHENERGAN) injection (IM or IVPB) Stopped (01/26/21 1902)   PRN Meds: bisacodyl, diazepam, ipratropium-albuterol, lactulose, lip balm, [DISCONTINUED] metoCLOPramide **OR** metoCLOPramide (REGLAN) injection, morphine injection, nitroGLYCERIN, [DISCONTINUED] promethazine **OR** promethazine (PHENERGAN) injection (IM or IVPB) **OR** promethazine, sodium chloride flush   Vital Signs    Vitals:   02/03/21 1957 02/03/21 2008 02/04/21 0750 02/04/21 0801  BP:  (!) 110/35 127/73   Pulse:  88 93   Resp:  18 15   Temp:  97.9 F (36.6 C) 98 F (36.7 C)   TempSrc:  Axillary Oral   SpO2: 92% 98% 97% 96%  Weight:      Height:         Intake/Output Summary (Last 24 hours) at 02/04/2021 1122 Last data filed at 02/04/2021 0839 Gross per 24 hour  Intake 250 ml  Output 600 ml  Net -350 ml   Last 3 Weights 02/01/2021 01/31/2021 01/30/2021  Weight (lbs) 218 lb 7.6 oz 214 lb 11.7 oz 217 lb 2.5 oz  Weight (kg) 99.1 kg 97.4 kg 98.5 kg      Telemetry    Sinus rhythm, heart rate 95- Personally Reviewed  ECG    No new tracing obtained  Physical Exam   GEN:  Well nourished, mild distress.  HEENT: Normal  CARDIAC: RRR, 3/6 systolic murmur  RESPIRATORY: Diminished breath sounds at bases  ABDOMEN: Soft, non-tender, non-distended  MUSCULOSKELETAL:   1+ edema; No deformity   SKIN: Warm and dry  NEUROLOGIC:  Alert and oriented x 3  PSYCHIATRIC:   Normal affect    Labs    High Sensitivity Troponin:   Recent Labs  Lab 01/20/21 1336 01/20/21 1515 01/29/21 1054 01/29/21 1241  TROPONINIHS 11 12 1,374* 1,349*      Chemistry Recent Labs  Lab 01/30/21 0509 01/31/21 0757 02/02/21 0605 02/03/21 0530 02/04/21 0537 02/04/21 1026  NA  --   --  130* 127* 130* 129*  K  --   --  3.2* 3.9  --  4.1  CL  --   --  90* 87*  --  89*  CO2  --   --  30 33*  --  28  GLUCOSE  --   --  114* 128*  --  118*  BUN  --   --  28* 28*  --  29*  CREATININE  --    < > 1.12* 1.17*  --  1.24*  CALCIUM  --   --  8.0* 8.1*  --  8.5*  PROT 6.4*  --  6.7  --   --   --   ALBUMIN 2.6*  --  2.7*  --   --   --   AST 30  --  24  --   --   --   ALT 20  --  17  --   --   --   ALKPHOS 54  --  55  --   --   --   BILITOT 2.4*  --  1.3*  --   --   --   GFRNONAA  --    < > 48* 46*  --  43*  ANIONGAP  --   --  10 7  --  12   < > = values in this interval not displayed.     Hematology Recent Labs  Lab 01/30/21 0509 01/31/21 0404 02/01/21 0630  WBC 7.9 9.1 8.1  RBC 4.37 4.52 4.23  HGB 10.7* 10.9* 10.4*  HCT 33.1* 33.9* 32.5*  MCV 75.7* 75.0* 76.8*  MCH 24.5* 24.1* 24.6*  MCHC 32.3 32.2 32.0  RDW 20.6* 21.0* 21.7*  PLT 243 288 272     BNP Recent Labs  Lab 01/31/21 1618  BNP 1,191.7*     DDimer No results for input(s): DDIMER in the last 168 hours.   Radiology    No results found.  Cardiac Studies   01/29/2021 1. Left ventricular ejection fraction, by estimation, is 30 to 35%. The  left ventricle has moderately decreased function. The left ventricle  demonstrates regional wall motion abnormalities (see scoring  diagram/findings for description). There is  moderate left ventricular hypertrophy. Left ventricular diastolic  parameters are indeterminate. There is severe hypokinesis of the left  ventricular, mid-apical anteroseptal wall, anterior wall, anterolateral  wall and apical segment.   2. Right ventricular systolic function is normal. The right ventricular  size is mildly enlarged. There is moderately elevated pulmonary artery  systolic pressure.   3. Left atrial size was mildly dilated.   4. The mitral valve is degenerative. Mild mitral valve regurgitation.  Mild mitral stenosis. Moderate mitral annular calcification.   5. The aortic valve is tricuspid. There is moderate calcification of the  aortic valve. There is severe thickening of the aortic valve. Aortic valve  regurgitation is trivial. Severe aortic valve stenosis. Aortic valve area,  by VTI measures 0.54 cm.  Aortic valve mean gradient measures 32.0 mmHg.   6. The inferior vena cava is normal in size with greater than 50%  respiratory variability, suggesting right atrial pressure of 3 mmHg.   Patient Profile     87 y.o. female with history of CAD/PCI to RCA, severe aortic stenosis presenting with left hip pain, diagnosed with left hip abscess s/p I&D.  AAA noted on abdominal CT, s/p endovascular repair.  Hospital course complicated by newly found severely reduced ejection fraction, EF 30-35%.  Assessment & Plan    NSTEMI, history of CAD/PCI -s/p heparin drip x48 hours -continue Medical management with aspirin, Plavix x12  months. -Ranexa for antianginal benefit -  Comorbidities, including oxygen requirement, hip abscess preventing invasive work-up.  HFrEF, EF 30 to 35% -Continue Toprol-XL, lisinopril 2.5 mg daily -Creatinine elevated, decrease torsemide to 40 mg daily.  3.  Severe AS -Outpatient TAVR consideration.  Likely not a great candidate.  4.  AAA, status post endovascular repair -Access site with small hematoma. -PT OT if okay with surgical team.  Total encounter time 35 minutes  Greater than 50% was spent in counseling and coordination of care with the patient   Signed, Debbe Odea, MD  02/04/2021, 11:22 AM

## 2021-02-05 LAB — RENAL FUNCTION PANEL
Albumin: 2.9 g/dL — ABNORMAL LOW (ref 3.5–5.0)
Anion gap: 10 (ref 5–15)
BUN: 28 mg/dL — ABNORMAL HIGH (ref 8–23)
CO2: 29 mmol/L (ref 22–32)
Calcium: 8.6 mg/dL — ABNORMAL LOW (ref 8.9–10.3)
Chloride: 89 mmol/L — ABNORMAL LOW (ref 98–111)
Creatinine, Ser: 1.27 mg/dL — ABNORMAL HIGH (ref 0.44–1.00)
GFR, Estimated: 41 mL/min — ABNORMAL LOW (ref >60)
Glucose, Bld: 103 mg/dL — ABNORMAL HIGH (ref 70–99)
Phosphorus: 3.7 mg/dL (ref 2.5–4.6)
Potassium: 3.9 mmol/L (ref 3.5–5.1)
Sodium: 128 mmol/L — ABNORMAL LOW (ref 135–145)

## 2021-02-05 MED ORDER — PROSOURCE PLUS PO LIQD
30.0000 mL | Freq: Three times a day (TID) | ORAL | Status: DC
  Administered 2021-02-05 – 2021-02-10 (×12): 30 mL via ORAL
  Filled 2021-02-05 (×18): qty 30

## 2021-02-05 MED ORDER — ACETAMINOPHEN 325 MG PO TABS
650.0000 mg | ORAL_TABLET | Freq: Four times a day (QID) | ORAL | Status: DC | PRN
  Administered 2021-02-05: 650 mg via ORAL
  Filled 2021-02-05 (×2): qty 2

## 2021-02-05 MED ORDER — TRAZODONE HCL 50 MG PO TABS
25.0000 mg | ORAL_TABLET | Freq: Every evening | ORAL | Status: DC | PRN
  Administered 2021-02-09: 25 mg via ORAL
  Filled 2021-02-05 (×2): qty 1

## 2021-02-05 NOTE — Progress Notes (Signed)
Date of Admission:  01/15/2021   T ID: Monica [name] is a 87 y.o. female  Active Problems:   Abscess of left hip   Abdominal aortic aneurysm (AAA) without rupture (HCC)   Obesity, Class III, BMI 40-49.9 (morbid obesity) (HCC)   Pre-op evaluation   Shortness of breath   Hypoxia   Non-ST elevation (NSTEMI) myocardial infarction (HCC)   HFrEF (heart failure with reduced ejection fraction) (HCC)    Subjective: Patient states she is more short of breath today No pain in the left hip No fever   Medications:   (feeding supplement) PROSource Plus  30 mL Oral TID BM   aspirin  81 mg Oral Daily   Chlorhexidine Gluconate Cloth  6 each Topical Daily   clopidogrel  75 mg Oral Daily   docusate sodium  100 mg Oral BID   doxycycline  100 mg Oral Q12H   enoxaparin (LOVENOX) injection  0.5 mg/kg Subcutaneous Q24H   feeding supplement  237 mL Oral TID BM   ipratropium-albuterol  3 mL Nebulization TID   lisinopril  2.5 mg Oral Daily   melatonin  5 mg Oral QHS   methylPREDNISolone (SOLU-MEDROL) injection  40 mg Intravenous Daily   metoprolol succinate  25 mg Oral Daily   multivitamin with minerals  1 tablet Oral Daily   pantoprazole (PROTONIX) IV  40 mg Intravenous Daily   pneumococcal 23 valent vaccine  0.5 mL Intramuscular Tomorrow-1000   polyethylene glycol  17 g Oral Daily   ranolazine  500 mg Oral BID   rifampin  300 mg Oral BID WC   rosuvastatin  40 mg Oral Daily   sodium chloride flush  10-40 mL Intracatheter Q12H   torsemide  40 mg Oral Daily    Objective: Vital signs in last 24 hours: Temp:  [97.4 F (36.3 C)-98 F (36.7 C)] 97.4 F (36.3 C) (06/27 0740) Pulse Rate:  [83-97] 83 (06/27 0740) Resp:  [15-20] 15 (06/27 0740) BP: (100-136)/(56-94) 107/61 (06/27 0740) SpO2:  [90 %-98 %] 92 % (06/27 0756)  PHYSICAL EXAM:  General: Alert, cooperative, no distress, appears stated age.  On high flow nasal cannula. Lungs: Bilateral air entry.  Crackles bases. Heart: Irregular.   Systolic murmur. Abdomen: Soft, non-tender,not distended. Bowel sounds normal. No masses Extremities: Left lateral aspect of the thigh at the upper area next to the hip the surgical site is healed well.  No discharge.  No erythema.  Skin: No rashes or lesions. Or bruising Lymph: Cervical, supraclavicular normal. Neurologic: Grossly non-focal  Lab Results Recent Labs    02/04/21 1026 02/05/21 0732  NA 129* 128*  K 4.1 3.9  CL 89* 89*  CO2 28 29  BUN 29* 28*  CREATININE 1.24* 1.27*   Liver Panel Recent Labs    02/05/21 0732  ALBUMIN 2.9*   Microbiology: 01/16/2021.  Rare methicillin-resistant staph aureus in the wound culture from the abscess    Assessment/Plan: MRSA infection at the site of previous ORIF on the left femur area.  Very likely there is no involvement of the underlying bone contrary to what was thought before, as the surgical I&D has healed completely.  She received 17 days of vancomycin. She is currently on doxycycline.  Will give Doxy until 02/15/2021.  AAA aneurysm.  Had an endovascular repair on 01/24/2021  Aortic stenosis.  Symptomatic.  Needs TAVR.  Acute hypoxic respiratory failure on high flow oxygen  History of fractured femur on the left side with ORIF in February 2022.  Discussed the management with the patient, granddaughter and the cardiologist.  ID will sign off call if needed.

## 2021-02-05 NOTE — Progress Notes (Signed)
Progress Note  Patient Name: Monica [name] Date of Encounter: 02/05/2021  [facility] Cardiologist: Lorine Bears, MD   Subjective   She denies chest pain.  Still with exertional dyspnea she is requiring 7 L of oxygen.  She seems to be conflicted and confused about her options going forward.  Inpatient Medications    Scheduled Meds:  (feeding supplement) PROSource Plus  30 mL Oral TID BM   aspirin  81 mg Oral Daily   Chlorhexidine Gluconate Cloth  6 each Topical Daily   clopidogrel  75 mg Oral Daily   docusate sodium  100 mg Oral BID   doxycycline  100 mg Oral Q12H   enoxaparin (LOVENOX) injection  0.5 mg/kg Subcutaneous Q24H   feeding supplement  237 mL Oral TID BM   ipratropium-albuterol  3 mL Nebulization TID   lisinopril  2.5 mg Oral Daily   melatonin  5 mg Oral QHS   methylPREDNISolone (SOLU-MEDROL) injection  40 mg Intravenous Daily   metoprolol succinate  25 mg Oral Daily   multivitamin with minerals  1 tablet Oral Daily   pantoprazole (PROTONIX) IV  40 mg Intravenous Daily   pneumococcal 23 valent vaccine  0.5 mL Intramuscular Tomorrow-1000   polyethylene glycol  17 g Oral Daily   ranolazine  500 mg Oral BID   rosuvastatin  40 mg Oral Daily   sodium chloride flush  10-40 mL Intracatheter Q12H   torsemide  40 mg Oral Daily   Continuous Infusions:  sodium chloride 250 mL (01/29/21 1103)   promethazine (PHENERGAN) injection (IM or IVPB) Stopped (01/26/21 1902)   PRN Meds: bisacodyl, diazepam, guaiFENesin-dextromethorphan, ipratropium-albuterol, lactulose, lip balm, [DISCONTINUED] metoCLOPramide **OR** metoCLOPramide (REGLAN) injection, morphine injection, nitroGLYCERIN, [DISCONTINUED] promethazine **OR** promethazine (PHENERGAN) injection (IM or IVPB) **OR** promethazine, sodium chloride, sodium chloride flush, traZODone   Vital Signs    Vitals:   02/05/21 0756 02/05/21 1129 02/05/21 1135 02/05/21 1505  BP:  (!) 97/59 102/61 (!) 99/56  Pulse:  85 87    Resp:  (!) 23 20 18   Temp:  97.7 F (36.5 C)  97.6 F (36.4 C)  TempSrc:  Axillary  Axillary  SpO2: 92% 93%  97%  Weight:      Height:        Intake/Output Summary (Last 24 hours) at 02/05/2021 1605 Last data filed at 02/05/2021 1505 Gross per 24 hour  Intake 480 ml  Output 1700 ml  Net -1220 ml    Last 3 Weights 02/01/2021 01/31/2021 01/30/2021  Weight (lbs) 218 lb 7.6 oz 214 lb 11.7 oz 217 lb 2.5 oz  Weight (kg) 99.1 kg 97.4 kg 98.5 kg      Telemetry    Sinus rhythm, heart rate in the 80s and 90s.- Personally Reviewed  ECG    No new tracing obtained  Physical Exam   GEN:  Well nourished, mild distress.  HEENT: Normal  CARDIAC: RRR, 3/6 systolic murmur in the aortic area which is late peaking. RESPIRATORY: Diminished breath sounds at bases  ABDOMEN: Soft, non-tender, non-distended  MUSCULOSKELETAL:   Mild bilateral leg edema; No deformity   SKIN: Warm and dry  NEUROLOGIC:  Alert and oriented x 3  PSYCHIATRIC:   Normal affect    Labs    High Sensitivity Troponin:   Recent Labs  Lab 01/20/21 1336 01/20/21 1515 01/29/21 1054 01/29/21 1241  TROPONINIHS 11 12 1,374* 1,349*       Chemistry Recent Labs  Lab 01/30/21 0509 01/31/21 0757 02/02/21 0605 02/03/21 0530  02/04/21 0537 02/04/21 1026 02/05/21 0732  NA  --    < > 130* 127* 130* 129* 128*  K  --    < > 3.2* 3.9  --  4.1 3.9  CL  --    < > 90* 87*  --  89* 89*  CO2  --    < > 30 33*  --  28 29  GLUCOSE  --    < > 114* 128*  --  118* 103*  BUN  --    < > 28* 28*  --  29* 28*  CREATININE  --    < > 1.12* 1.17*  --  1.24* 1.27*  CALCIUM  --    < > 8.0* 8.1*  --  8.5* 8.6*  PROT 6.4*  --  6.7  --   --   --   --   ALBUMIN 2.6*  --  2.7*  --   --   --  2.9*  AST 30  --  24  --   --   --   --   ALT 20  --  17  --   --   --   --   ALKPHOS 54  --  55  --   --   --   --   BILITOT 2.4*  --  1.3*  --   --   --   --   GFRNONAA  --    < > 48* 46*  --  43* 41*  ANIONGAP  --    < > 10 7  --  12 10   < > =  values in this interval not displayed.      Hematology Recent Labs  Lab 01/30/21 0509 01/31/21 0404 02/01/21 0630  WBC 7.9 9.1 8.1  RBC 4.37 4.52 4.23  HGB 10.7* 10.9* 10.4*  HCT 33.1* 33.9* 32.5*  MCV 75.7* 75.0* 76.8*  MCH 24.5* 24.1* 24.6*  MCHC 32.3 32.2 32.0  RDW 20.6* 21.0* 21.7*  PLT 243 288 272     BNP Recent Labs  Lab 01/31/21 1618  BNP 1,191.7*      DDimer No results for input(s): DDIMER in the last 168 hours.   Radiology    No results found.  Cardiac Studies   01/29/2021 1. Left ventricular ejection fraction, by estimation, is 30 to 35%. The  left ventricle has moderately decreased function. The left ventricle  demonstrates regional wall motion abnormalities (see scoring  diagram/findings for description). There is  moderate left ventricular hypertrophy. Left ventricular diastolic  parameters are indeterminate. There is severe hypokinesis of the left  ventricular, mid-apical anteroseptal wall, anterior wall, anterolateral  wall and apical segment.   2. Right ventricular systolic function is normal. The right ventricular  size is mildly enlarged. There is moderately elevated pulmonary artery  systolic pressure.   3. Left atrial size was mildly dilated.   4. The mitral valve is degenerative. Mild mitral valve regurgitation.  Mild mitral stenosis. Moderate mitral annular calcification.   5. The aortic valve is tricuspid. There is moderate calcification of the  aortic valve. There is severe thickening of the aortic valve. Aortic valve  regurgitation is trivial. Severe aortic valve stenosis. Aortic valve area,  by VTI measures 0.54 cm.  Aortic valve mean gradient measures 32.0 mmHg.   6. The inferior vena cava is normal in size with greater than 50%  respiratory variability, suggesting right atrial pressure of 3 mmHg.   Patient Profile  87 y.o. female with history of CAD/PCI to RCA, severe aortic stenosis presenting with left hip pain, diagnosed  with left hip abscess s/p I&D.  AAA noted on abdominal CT, s/p endovascular repair.  Hospital course complicated by newly found severely reduced ejection fraction, EF 30-35%.  Assessment & Plan    NSTEMI, history of CAD/PCI -s/p heparin drip x48 hours -continue Medical management with aspirin, Plavix x12 months. -Ranexa for antianginal benefit -I had a prolonged discussion with the patient and her granddaughter who was present at the bedside about management options.  I do think she is making slow improvement after 2 recent major surgeries.  I do not think it makes sense now to proceed with a comfort care approach after all that has been done.  I do think it is worth evaluating her cardiac status and see if we can make improvement.  I think the first step is to proceed with a right and left cardiac catheterization which can likely be done during this admission later this week hopefully with improvement of oxygen requirement.  HFrEF, EF 30 to 35% -Continue Toprol-XL, lisinopril 2.5 mg daily -Continue current dose of torsemide as she appears to be euvolemic.  3.  Severe AS -She will require TAVR evaluation.  Her mobility has to improve in order to make sure she will be able to rehab.  4.  AAA, status post endovascular repair Followed by vascular surgery.  5.  Hypoxia: The exact etiology of this is not entirely clear as she does not appear to be significantly volume overloaded.  She might have underlying lung disease.  Right to left shunt is also a possibility and can be evaluated with a right heart catheterization and a saturation run.  Pulmonary embolism is also a possibility given decreased mobility.   Signed, Lorine Bears, MD  02/05/2021, 4:05 PM

## 2021-02-05 NOTE — Progress Notes (Signed)
[facility] [id number]  [employer] [facility]) [facility] Liaison Note:  Unfortunately, [facility]ospice Home does not have a room available to offer to patient today.   Patient's son Joda Braatz and hospital care team aware hospital liaison will follow up tomorrow or sooner to assess for eligibility if a room becomes available.   Please do not hesitate to call with any hospice related questions.   Thank you,   Bobbie "Einar Gip, RN, BSN [facility] Liaison [phone]

## 2021-02-05 NOTE — Progress Notes (Signed)
PROGRESS NOTE    Monica [name]  PJA:[id number] DOB: [birth year] DOA: 01/15/2021 PCP: Patient, No Pcp Per (Inactive)   Brief Narrative:  Monica [name] is a 87 y.o. F with CAD s/p PCI x1 in 2018, severe AS being worked up for TAVR, Obesity, pHTN, and COPD who presented with hip pain.   Had hip fracture after fall in Feb, repaired and discharged home.  Since being home 4 months ago, really never able to walk due to pain. Finally, in the last week before admission, developed redness and worse pain at left hip surgical site, then wound dehisced.   Presented to the ER and Orthopedics were consulted for suspected post-surgical hip infection with MRSA.  ID is recommending 4 weeks of vancomycin and rifampin.    Also noted incidentally to have very large AAA, she was taken to the OR for endovascular repair with vascular surgery today.  Also had severe aortic stenosis and plan is for TAVR work-up after AAA repair, most likely as an outpatient if able to get out of hospital from current admission.  She will not be a good candidate due to multiple comorbidities.  Patient with multiple life-threatening comorbidities-high risk for deterioration and death, palliative care was also consulted, patient is now on DNR/DNI.  She will consider comfort care if deteriorates. At this time we will continue current level of care.  Patient had her endovascular AAA repair done with vascular surgery on 01/24/2021, she remained intubated and in ICU for 2 days after that, extubated yesterday and we will resume care again from today.  Remained on heated high flow, weak cough and appears very lethargic.  6/18: Appears more lethargic with worsening oxygen requirement, today at 50 L and 65% FiO2  6/19: Little improvement in FiO2, started taking few sips here and there.  6/20: Patient remains on heated high flow at 55 L with 50% FiO2.  Developed chest pain which he responded to nitroglycerin.  Troponin elevated at 1374, EKG with  nonspecific T wave changes.  Concern of NSTEMI.  Cardiology was reconsulted, they are recommending conservative management.  She was started on heparin infusion after consulting vascular surgery as she recently had her endovascular AAA repair.  6/21: Echocardiogram with newly reduced EF to 30 to 35% with regional wall motion abnormalities consistent with recent anterior MI.  Not a candidate for cardiac catheterization.  Medical management per cardiology.  Remains on high level of oxygen with heated high flow.  Palliative care again met her but she wants to keep pushing, prognosis is very guarded due to multiple life limiting comorbidities.  Very high risk for deterioration and death.  6/22-spoke to daughter via phone today discussed patient's prognosis.  Still on 9 L high flow.  Today patient was not feeling well and felt she was still short of breath.  After much discussion with daughter, she is agreeable to home with hospice.  We have consulted hospice to evaluate patient.  6/23 when I went to see patient she was on 9 L high flow nasal cannula satting 86%, I increase it to 13 with 02 sat 93%.  She reports less short of breath but talking to her she appears to be still short of breath.  6/24pt without complaints this am. Feels like she is "doing better" .  However requiring 10 L high flow [place].  Also hypotensive while at rest.  Discussed with son about his decision whether to do home hospice or hospice house and they would like to proceed with hospice  house.  6/25-reports "doing better today" . Denies worsening sob. No cp On between 7-11 HFNC 6/26- while talking to me pt o2 sat dropped on 9 L to 86%, had to increase it to 11L with 02 sat 90%  6/27- pt denies sob. Palliative discussing goals of care with pt. Hospice to evaluate pt.   Subjective: Denies cp   Assessment & Plan:   Active Problems:   Abscess of left hip   Abdominal aortic aneurysm (AAA) without rupture (HCC)   Obesity, Class  III, BMI 40-49.9 (morbid obesity) (HCC)   Pre-op evaluation   Shortness of breath   Hypoxia   Non-ST elevation (NSTEMI) myocardial infarction (HCC)   HFrEF (heart failure with reduced ejection fraction) ([place])  Chest pain/NSTEMI.   Concern of NSTEMI with markedly elevated troponin.  Chest pain responded to nitroglycerin.  History of CAD with PCI to RCA.  Echocardiogram with new onset of reduced EF with regional wall motion abnormalities consistent with recent anterior MI. Cardiology was reconsulted and they are recommending conservative management. Cardiology started her on heparin infusion after consulting vascular surgery. continue aspirin, Plavix x12 months  6/26-continue Ranexa  Medical management only, continue aspirin, Plavix x12 months Completed 48 hours of heparin drip No invasive work-up Beta-blockers 6/27 cardiology saw patient and recommended right and left cardiac cath as patient making slow improvement and she is not ready for hospice    Acute on chronic combined diastolic and systolic HF Cards following Switched lasix to Torsemide Decresae torsemide to $RemoveBefo'40mg'NjLrmdVpBqz$  qd as creat. Up Continue toprol xl. Lisinopril 2.$RemoveBeforeDE'5mg'nOkbuJlTdMUshQe$  qd 6/27 per cardiology plan for left and right heart cath later this week   Severe aortic stenosis Previously known, since hip fracture in Feb.  Was pending TAVR eval prior to this admission, seen by Cardiology here. 6/27 TAVR outpatient was considered but not a great candidate.  However today per cardiology she will require TAVR evaluation.  Her mobility has improved in order to make sure she will be able to go to rehab     Lt brachial access Swelling: LUE swollen around surgical site.  6/27 vascular evaluated this, likely small hematoma.  Palpable radial pulse.  No concerns over access site      Postsurgical hip infection S/p debridement of the left hip wound by Dr. Posey Pronto, 6/7 Admitted and taken to OR 6/7 for washout.  Intraoperative cultures growing  MRSA in 3/3. ID consulted, recommended treating with 4 weeks antibiotics for presumed MRSA prosthetic infection.  1/2 blood cultures positive for staph epidermidis which was thought to be a contaminant. -Orthopedic remove the suture and drain today-no obvious sign of infection according to their note. 6/25 per ID looks like infection of soft tissue did not do bone as the I&D has healed.   She already received 17 days of IV vancomycin  Can change to p.o. doxycycline  Would stop rifampin on 02/06/2021   Lower abdominal pain.  Operative sites looks okay.   Possibly due to constipation.   6/26 continue bowel regimen to prevent constipation   Hypokalemia-Will replace potassium Monitor levels   Hypotension due to aortic stenosis Continue midodrine    Acute on chronic hypoxic respiratory failure.  Mild improvement in FiO2 requirement today.  Otherwise remained on heated high flow at 45 L and 50% FiO2. Has chronic hypoxic respiratory failure due to aortic stenosis, heart failure, and pulmonary hypertension.  COPD is in chart but appears to be mild. Former smoker.  Uses 2 L of oxygen at baseline.  Initially requiring 2 to 3 L and then got worse requiring up to 10 L starting from 6/12. Chest x-ray unremarkable, CT angiogram of the chest with patchy subpleural fibrosis, no PE, moderate atelectasis, no edema or large consolidation, no effusion. Patient required prolonged intubation after the procedure, extubated but continued to require very high levels of oxygen. 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  Patient was evaluated by vascular surgery, who recommended urgent repair given the size.  She is not a candidate for open repair.  She was taken to the  OR for endovascular repair-tolerated the procedure well, stable from vascular standpoint and will follow-up as an outpatient. Vascular was following.   Iron deficiency anemia Hgb stable Given Feraheme 6/10 -Transfusion threshold 8 g/dL  Hyponatremia.   Mild, asymptomatic  Vaginal candidiasis Given Fluconazole 1 dose on 6/10.   Left renal nodule Incidental finding, minor, given gravity of AAA and AS and hypoxia, this likely has not been discussed with patient and should be followed up only depending on goals of care after AAA repair - Follow-up MRI as an outpatient  Class II obesity. Estimated body mass index is 36.36 kg/m as calculated from the following:   Height as of this encounter: $RemoveBeforeD'5\' 5"'ROIheLyLdKNqSW$  (1.651 m).   Weight as of this encounter: 99.1 kg.   Goals of care.hospice to evaluate patient for home hospice versus hospice house  Objective: Vitals:   02/04/21 2046 02/05/21 0031 02/05/21 0506 02/05/21 0740  BP:   (!) 100/56 107/61  Pulse:   84 83  Resp:   17 15  Temp:  97.7 F (36.5 C) 97.8 F (36.6 C) (!) 97.4 F (36.3 C)  TempSrc:  Oral Oral Axillary  SpO2: 95%  98% 92%  Weight:      Height:        Intake/Output Summary (Last 24 hours) at 02/05/2021 0851 Last data filed at 02/05/2021 5027 Gross per 24 hour  Intake 240 ml  Output 1800 ml  Net -1560 ml   Filed Weights   01/30/21 0500 01/31/21 1100 02/01/21 0649  Weight: 98.5 kg 97.4 kg 99.1 kg    Examination: Calm, NAD Decreased breath sounds anteriorly Regular S1-S2 no gallops Soft benign positive bowel sounds Decreased lower extremity edema Awake and alert and oriented x4 Mood and affect appropriate in current setting  DVT prophylaxis: Lovenox Code Status: DNR Family Communication: none at bedside Disposition Plan:  Status is: Inpatient  Remains inpatient appropriate because:Inpatient level of care appropriate due to severity of illness  Dispo: The patient is from:  Home              Anticipated d/c is  to:  To be determined              Patient currently is not medically stable to d/c.   Difficult to place patient No                Level of care: Progressive Cardiac  Consultants:  Vascular surgery Pulmonology Cardiology  Procedures:  Antimicrobials:  Vancomycin..>dc 6/24 Rifampin>> Doxycycline 6/24>>>  Data Reviewed: I have personally reviewed following labs and imaging studies  CBC: Recent Labs  Lab 01/30/21 0509 01/31/21 0404 02/01/21 0630  WBC 7.9 9.1 8.1  HGB 10.7* 10.9* 10.4*  HCT 33.1* 33.9* 32.5*  MCV 75.7* 75.0* 76.8*  PLT 243 288 827   Basic Metabolic Panel: Recent Labs  Lab 02/01/21 0630 02/02/21 0605 02/03/21 0530 02/04/21 0537 02/04/21 1026 02/05/21 0732  NA  --  130* 127* 130* 129* 128*  K  --  3.2* 3.9  --  4.1 3.9  CL  --  90* 87*  --  89* 89*  CO2  --  30 33*  --  28 29  GLUCOSE  --  114* 128*  --  118* 103*  BUN  --  28* 28*  --  29* 28*  CREATININE 1.02* 1.12* 1.17*  --  1.24* 1.27*  CALCIUM  --  8.0* 8.1*  --  8.5* 8.6*  PHOS  --   --   --   --   --  3.7   GFR: Estimated Creatinine Clearance: 37.7 mL/min (A) (by C-G formula based on SCr of 1.27 mg/dL (H)). Liver Function Tests: Recent Labs  Lab 01/30/21 0509 02/02/21 0605 02/05/21 0732  AST 30 24  --   ALT 20 17  --   ALKPHOS 54 55  --   BILITOT 2.4* 1.3*  --   PROT 6.4* 6.7  --   ALBUMIN 2.6* 2.7* 2.9*   No results for input(s): LIPASE, AMYLASE in the last 168 hours. No results for input(s): AMMONIA in the last 168 hours. Coagulation Profile: Recent Labs  Lab 01/29/21 1336  INR 1.4*   Cardiac Enzymes: No results for input(s): CKTOTAL, CKMB, CKMBINDEX, TROPONINI in the last 168 hours. BNP (last 3 results) No results for input(s): PROBNP in the last 8760 hours. HbA1C: No results for input(s): HGBA1C in the last 72 hours. CBG: Recent Labs  Lab 02/04/21 2005  GLUCAP 126*   Lipid Profile: No results for input(s): CHOL, HDL, LDLCALC, TRIG, CHOLHDL, LDLDIRECT in the  last 72 hours. Thyroid Function Tests: No results for input(s): TSH, T4TOTAL, FREET4, T3FREE, THYROIDAB in the last 72 hours. Anemia Panel: No results for input(s): VITAMINB12, FOLATE, FERRITIN, TIBC, IRON, RETICCTPCT in the last 72 hours. Sepsis Labs: No results for input(s): PROCALCITON, LATICACIDVEN in the last 168 hours.  No results found for this or any previous visit (from the past 240 hour(s)).    Radiology Studies: No results found.  Scheduled Meds:  aspirin  81 mg Oral Daily   Chlorhexidine Gluconate Cloth  6 each Topical Daily   clopidogrel  75 mg Oral Daily   docusate sodium  100 mg Oral BID   doxycycline  100 mg Oral Q12H   enoxaparin (LOVENOX) injection  0.5 mg/kg Subcutaneous Q24H   feeding supplement  237 mL Oral TID BM   ipratropium-albuterol  3 mL Nebulization TID   lisinopril  2.5 mg Oral Daily   melatonin  5 mg Oral QHS   methylPREDNISolone (SOLU-MEDROL) injection  40 mg Intravenous Daily   metoprolol succinate  25 mg Oral Daily   multivitamin with minerals  1 tablet Oral Daily   pantoprazole (PROTONIX) IV  40 mg Intravenous Daily   pneumococcal 23 valent vaccine  0.5 mL Intramuscular Tomorrow-1000   polyethylene glycol  17 g Oral Daily   ranolazine  500 mg Oral BID   rifampin  300 mg Oral BID WC   rosuvastatin  40 mg Oral Daily   sodium chloride flush  10-40 mL Intracatheter Q12H   torsemide  40 mg Oral Daily   Continuous Infusions:  sodium chloride 250 mL (01/29/21 1103)   promethazine (PHENERGAN) injection (  IM or IVPB) Stopped (01/26/21 1902)     LOS: 21 days   Time spent:  35 minutes with >50% on coc   Nolberto Hanlon, MD [facility]  If 7PM-7AM, please contact night-coverage [email or web address]

## 2021-02-05 NOTE — Progress Notes (Signed)
Pt is A&O, VS stable, NSR on the monitor. 7L hiflo [place]. Purwick in place, failed x2, replaced. Complained of pain x2, given morphine x2 once without relief and once with. Unable to have a BM. No other issues.

## 2021-02-05 NOTE — Progress Notes (Signed)
Nutrition Follow-up  DOCUMENTATION CODES:  Obesity unspecified  INTERVENTION:  Continue Ensure Enlive TID and Magic Cup TID.  Continue to advance diet per SLP.  Continue MVI with minerals daily.  Add 30 ml ProSource Plus po TID, each supplement provides 100 kcal and 15 grams of protein.    NUTRITION DIAGNOSIS:  Inadequate oral intake related to poor appetite as evidenced by meal completion < 50%. - ongoing  GOAL:  Patient will meet greater than or equal to 90% of their needs - not meeting  MONITOR:  PO intake, Supplement acceptance, Labs  REASON FOR ASSESSMENT:  Malnutrition Screening Tool    ASSESSMENT:  87 y.o. female with a past medical history of CAD, hyperlipidemia presented to ED for drainage from her left hip. Had left hip replacement in February and according to patient, the incision/scar opened up and began draining 6/5.  Patient noticed redness swelling and tenderness around the incision as well recently. 6/7 - I&D and wound vac placement on L hip 6/15 - AAA repair 6/16 - extubated 6/20 - NSTEMI  Palliative care has been following pt. Pt has expressed on multiple occasions she would not want a feeding tube, CPR, or intubation.  Spoke with pt at bedside. Reports her appetite is improving a little and she ate ~70% of her breakfast, leaving the sausage. Per Epic, pt has eaten 0-100% of meals, mostly 0-30%.   Admit wt: 106.6 kg Current wt: 99.1 kg Pt continues to lose weight through admission, likely from poor PO and multiple wounds.  Recommend continuing Ensure TID, Magic Cup TID, and MVI with minerals. Also recommend adding ProSource Plus TID to help with protein intake.  Medications: reviewed; colace BID, EE TID, MVI with minerals, miralax, Valium PRN (given once today), morphine PRN (given once today)  Labs: reviewed; Na 128, Glucose 103  Diet Order:   Diet Order             DIET - DYS 1 Room service appropriate? Yes; Fluid consistency: Thin  Diet  effective now                  EDUCATION NEEDS:  Education needs have been addressed  Skin:  Skin Assessment: Skin Integrity Issues: Skin Integrity Issues:: Other (Comment), Incisions, Wound VAC, Stage II Stage II: sacrum Wound Vac: left hip Incisions: abdomen, left hip Other: MASD to the thighs  Last BM:  01/30/21 - Type 4, medium  Height:  Ht Readings from Last 1 Encounters:  01/24/21 5\' 5"  (1.651 m)   Weight:  Wt Readings from Last 1 Encounters:  02/01/21 99.1 kg   Ideal Body Weight:  56.8 kg  BMI:  Body mass index is 36.36 kg/m.  Estimated Nutritional Needs:  Kcal:  1900-2100 kcal/d Protein:  130-145 grams Fluid:  2L/d  02/03/21, RD, LDN (she/her/hers) Registered Dietitian I After-Hours/Weekend Pager # in [place]

## 2021-02-05 NOTE — Progress Notes (Signed)
Daily Progress Note   Patient Name: Monica [name]       Date: 02/05/2021 DOB: July 01, 1934  Age: 87 y.o. MRN#: [id number] Attending Physician: Lynn Ito, MD Primary Care Physician: Patient, No Pcp Per (Inactive) Admit Date: 01/15/2021  Reason for Consultation/Follow-up: Establishing goals of care  Subjective: Patient is resting in bed. Per chart, O2 had to be increased yesterday after patient had been talking. Patient has really been unable to move because of SOB. Patient states she wants to be able to spend time with her family, but states "I'm about done with all this." Primary MD in to bedside. Patient remaining on her current care until she is able to transfer to a hospice facility to transition to full comfort there.   Length of Stay: 21  Current Medications: Scheduled Meds:  . (feeding supplement) PROSource Plus  30 mL Oral TID BM  . aspirin  81 mg Oral Daily  . Chlorhexidine Gluconate Cloth  6 each Topical Daily  . clopidogrel  75 mg Oral Daily  . docusate sodium  100 mg Oral BID  . doxycycline  100 mg Oral Q12H  . enoxaparin (LOVENOX) injection  0.5 mg/kg Subcutaneous Q24H  . feeding supplement  237 mL Oral TID BM  . ipratropium-albuterol  3 mL Nebulization TID  . lisinopril  2.5 mg Oral Daily  . melatonin  5 mg Oral QHS  . methylPREDNISolone (SOLU-MEDROL) injection  40 mg Intravenous Daily  . metoprolol succinate  25 mg Oral Daily  . multivitamin with minerals  1 tablet Oral Daily  . pantoprazole (PROTONIX) IV  40 mg Intravenous Daily  . pneumococcal 23 valent vaccine  0.5 mL Intramuscular Tomorrow-1000  . polyethylene glycol  17 g Oral Daily  . ranolazine  500 mg Oral BID  . rifampin  300 mg Oral BID WC  . rosuvastatin  40 mg Oral Daily  . sodium chloride flush  10-40 mL  Intracatheter Q12H  . torsemide  40 mg Oral Daily    Continuous Infusions: . sodium chloride 250 mL (01/29/21 1103)  . promethazine (PHENERGAN) injection (IM or IVPB) Stopped (01/26/21 1902)    PRN Meds: bisacodyl, diazepam, guaiFENesin-dextromethorphan, ipratropium-albuterol, lactulose, lip balm, [DISCONTINUED] metoCLOPramide **OR** metoCLOPramide (REGLAN) injection, morphine injection, nitroGLYCERIN, [DISCONTINUED] promethazine **OR** promethazine (PHENERGAN) injection (IM or IVPB) **OR** promethazine, sodium chloride, sodium chloride  flush, traZODone  Physical Exam Pulmonary:     Effort: Pulmonary effort is normal.  Neurological:     Mental Status: She is alert.            Vital Signs: BP 107/61 (BP Location: Right Arm)   Pulse 83   Temp (!) 97.4 F (36.3 C) (Axillary)   Resp 15   Ht 5\' 5"  (1.651 m)   Wt 99.1 kg   SpO2 92%   BMI 36.36 kg/m  SpO2: SpO2: 92 % O2 Device: O2 Device: High Flow Nasal Cannula O2 Flow Rate: O2 Flow Rate (L/min): 7 L/min  Intake/output summary:  Intake/Output Summary (Last 24 hours) at 02/05/2021 1015 Last data filed at 02/05/2021 02/07/2021 Gross per 24 hour  Intake 240 ml  Output 1800 ml  Net -1560 ml   LBM: Last BM Date: 01/30/21 Baseline Weight: Weight: 106.6 kg Most recent weight: Weight: 99.1 kg   Flowsheet Rows    Flowsheet Row Most Recent Value  Intake Tab   Referral Department Hospitalist  Unit at Time of Referral Med/Surg Unit  Date Notified 01/23/21  Palliative Care Type New Palliative care  Reason for referral Clarify Goals of Care  Date of Admission 01/15/21  Date first seen by Palliative Care 01/23/21  # of days Palliative referral response time 0 Day(s)  # of days IP prior to Palliative referral 8  Clinical Assessment   Psychosocial & Spiritual Assessment   Palliative Care Outcomes        Patient Active Problem List   Diagnosis Date Noted  . HFrEF (heart failure with reduced ejection fraction) (HCC)   . Non-ST  elevation (NSTEMI) myocardial infarction (HCC)   . Hypoxia   . Shortness of breath   . Pre-op evaluation   . Abdominal aortic aneurysm (AAA) without rupture (HCC)   . Obesity, Class III, BMI 40-49.9 (morbid obesity) (HCC)   . Abscess of left hip 01/15/2021  . Acute respiratory failure with hypoxia (HCC) 10/05/2020  . Severe aortic stenosis   . Closed comminuted intertrochanteric fracture of proximal end of left femur (HCC) 10/02/2020  . Aortic stenosis   . Obesity   . Cellulitis of left lower extremity 04/29/2018  . GI bleed 06/05/2017  . Nausea vomiting and diarrhea   . Acute ST elevation myocardial infarction (STEMI) of inferior wall (HCC) 06/02/2017  . CAD (coronary artery disease) 2018    Palliative Care Assessment & Plan    Recommendations/Plan: Continue current care, transition comfort care at hospice    Code Status:    Code Status Orders  (From admission, onward)           Start     Ordered   01/23/21 1543  Do not attempt resuscitation (DNR)  Continuous       Question Answer Comment  In the event of cardiac or respiratory ARREST Do not call a "code blue"   In the event of cardiac or respiratory ARREST Do not perform Intubation, CPR, defibrillation or ACLS   In the event of cardiac or respiratory ARREST Use medication by any route, position, wound care, and other measures to relive pain and suffering. May use oxygen, suction and manual treatment of airway obstruction as needed for comfort.   Comments MOST form on chart.      01/23/21 1542           Code Status History     Date Active Date Inactive Code Status Order ID Comments User Context   01/15/2021 2309  01/23/2021 1542 Full Code [id number]  Hannah Beat, MD Inpatient   10/02/2020 1117 10/09/2020 2150 Full Code [id number]  Lucile Shutters, MD ED   04/30/2018 1018 05/02/2018 1808 DNR [id number]  Adrian Saran, MD Inpatient   04/29/2018 1732 04/30/2018 1018 Full Code [id number]  Houston Siren, MD Inpatient    06/05/2017 0521 06/07/2017 1404 Full Code [id number]  Ihor Austin, MD Inpatient   06/02/2017 0059 06/04/2017 1932 Full Code [id number]  Iran Ouch, MD Inpatient      Advance Directive Documentation    Flowsheet Row Most Recent Value  Type of Advance Directive Healthcare Power of Attorney, Living will  Pre-existing out of facility DNR order (yellow form or pink MOST form) --  "MOST" Form in Place? --       Prognosis:  < 2 weeks On comfort care    Care plan was discussed with primary MD and RN  Thank you for allowing the Palliative Medicine Team to assist in the care of this patient.       Total Time 45 min Prolonged Time Billed  no       Greater than 50%  of this time was spent counseling and coordinating care related to the above assessment and plan.  Morton Stall, NP  Please contact Palliative Medicine Team phone at [phone] for questions and concerns.

## 2021-02-06 LAB — BASIC METABOLIC PANEL
Anion gap: 11 (ref 5–15)
BUN: 34 mg/dL — ABNORMAL HIGH (ref 8–23)
CO2: 30 mmol/L (ref 22–32)
Calcium: 8.5 mg/dL — ABNORMAL LOW (ref 8.9–10.3)
Chloride: 86 mmol/L — ABNORMAL LOW (ref 98–111)
Creatinine, Ser: 1.27 mg/dL — ABNORMAL HIGH (ref 0.44–1.00)
GFR, Estimated: 41 mL/min — ABNORMAL LOW (ref >60)
Glucose, Bld: 108 mg/dL — ABNORMAL HIGH (ref 70–99)
Potassium: 3.7 mmol/L (ref 3.5–5.1)
Sodium: 127 mmol/L — ABNORMAL LOW (ref 135–145)

## 2021-02-06 LAB — CBC
HCT: 33.4 % — ABNORMAL LOW (ref 36.0–46.0)
Hemoglobin: 11 g/dL — ABNORMAL LOW (ref 12.0–15.0)
MCH: 24.7 pg — ABNORMAL LOW (ref 26.0–34.0)
MCHC: 32.9 g/dL (ref 30.0–36.0)
MCV: 75.1 fL — ABNORMAL LOW (ref 80.0–100.0)
Platelets: 301 10*3/uL (ref 150–400)
RBC: 4.45 MIL/uL (ref 3.87–5.11)
RDW: 23 % — ABNORMAL HIGH (ref 11.5–15.5)
WBC: 6.2 10*3/uL (ref 4.0–10.5)
nRBC: 0 % (ref 0.0–0.2)

## 2021-02-06 LAB — SEDIMENTATION RATE: Sed Rate: 49 mm/hr — ABNORMAL HIGH (ref 0–30)

## 2021-02-06 LAB — C-REACTIVE PROTEIN: CRP: 2.1 mg/dL — ABNORMAL HIGH (ref <1.0)

## 2021-02-06 MED ORDER — TORSEMIDE 20 MG PO TABS: 20.0000 mg | ORAL_TABLET | Freq: Every day | ORAL | Status: DC

## 2021-02-06 NOTE — Progress Notes (Addendum)
Daily Progress Note   Patient Name: Monica [name]       Date: 02/06/2021 DOB: 1933/11/11  Age: 87 y.o. MRN#: [id number] Attending Physician: Lynn Ito, MD Primary Care Physician: Patient, No Pcp Per (Inactive) Admit Date: 01/15/2021  Reason for Consultation/Follow-up: Establishing goals of care  Subjective: Son is at bedside.  Discussion of cardiology conversation yesterday.  Long and detailed conversation regarding an aggressive and comfort path. Discussed multiple scenarios.  Discussed that ultimately this is her decision alone to make. The one thing she is quite sure of, is that she would not want to live permanently in a nursing home-and is clear that she would rather be made comfortable and die than to do so.  The family is very clear that they will not be able to allow her to come home unless she is fully functional, independent, and able to live alone.  Following her hip fracture, at best she was able to stand, pivot, and sit.  The family has been clear that once she came home, she would not allow physical therapy to come to her house and work with her following her rehab stay. Son is also clear that this previous level of functional ability is not sustainable for the family at home.  She has debilitated since then.  She has had a poor appetite prior to this hospitalization, and continues to only eat bites of food and drink very small amounts of liquid.  She states she does not believe she would be amenable to a PEG tube.  She states she wants to get better, but states she is not interested in doing rehabilitation in any form, she just wants to go home, which the family is clear is not an option.  She states she is not ready to die, and that she wants to try to do what she can to live longer for  her family, but not at the price of having to live in a nursing home. These are very hard decisions that she has to make.    It is important as discussions are occurring regarding what treatments to pursue, that all teams be clear about expectations for level of improvement in functional status keeping in mind that the only acceptable outcome to her and her family, is being fully independent so that she can go home  to live alone.   Hospice recommendation is currently now changed to palliative.  Length of Stay: 22  Current Medications: Scheduled Meds:  . (feeding supplement) PROSource Plus  30 mL Oral TID BM  . aspirin  81 mg Oral Daily  . Chlorhexidine Gluconate Cloth  6 each Topical Daily  . clopidogrel  75 mg Oral Daily  . docusate sodium  100 mg Oral BID  . doxycycline  100 mg Oral Q12H  . enoxaparin (LOVENOX) injection  0.5 mg/kg Subcutaneous Q24H  . feeding supplement  237 mL Oral TID BM  . ipratropium-albuterol  3 mL Nebulization TID  . lisinopril  2.5 mg Oral Daily  . melatonin  5 mg Oral QHS  . methylPREDNISolone (SOLU-MEDROL) injection  40 mg Intravenous Daily  . metoprolol succinate  25 mg Oral Daily  . multivitamin with minerals  1 tablet Oral Daily  . pantoprazole (PROTONIX) IV  40 mg Intravenous Daily  . pneumococcal 23 valent vaccine  0.5 mL Intramuscular Tomorrow-1000  . polyethylene glycol  17 g Oral Daily  . ranolazine  500 mg Oral BID  . rosuvastatin  40 mg Oral Daily  . sodium chloride flush  10-40 mL Intracatheter Q12H  . torsemide  40 mg Oral Daily    Continuous Infusions: . sodium chloride 250 mL (01/29/21 1103)  . promethazine (PHENERGAN) injection (IM or IVPB) Stopped (01/26/21 1902)    PRN Meds: acetaminophen, bisacodyl, diazepam, guaiFENesin-dextromethorphan, ipratropium-albuterol, lactulose, lip balm, [DISCONTINUED] metoCLOPramide **OR** metoCLOPramide (REGLAN) injection, morphine injection, nitroGLYCERIN, [DISCONTINUED] promethazine **OR**  promethazine (PHENERGAN) injection (IM or IVPB) **OR** promethazine, sodium chloride, sodium chloride flush, traZODone  Physical Exam Pulmonary:     Effort: Pulmonary effort is normal.  Neurological:     Mental Status: She is alert.            Vital Signs: BP (!) 95/54 (BP Location: Right Arm)   Pulse 83   Temp 98 F (36.7 C) (Oral)   Resp 20   Ht 5\' 5"  (1.651 m)   Wt 99.1 kg   SpO2 96%   BMI 36.36 kg/m  SpO2: SpO2: 96 % O2 Device: O2 Device: High Flow Nasal Cannula O2 Flow Rate: O2 Flow Rate (L/min): 6 L/min  Intake/output summary:  Intake/Output Summary (Last 24 hours) at 02/06/2021 1056 Last data filed at 02/05/2021 1505 Gross per 24 hour  Intake --  Output 1000 ml  Net -1000 ml   LBM: Last BM Date: 01/30/21 Baseline Weight: Weight: 106.6 kg Most recent weight: Weight: 99.1 kg     Flowsheet Rows    Flowsheet Row Most Recent Value  Intake Tab   Referral Department Hospitalist  Unit at Time of Referral Med/Surg Unit  Date Notified 01/23/21  Palliative Care Type New Palliative care  Reason for referral Clarify Goals of Care  Date of Admission 01/15/21  Date first seen by Palliative Care 01/23/21  # of days Palliative referral response time 0 Day(s)  # of days IP prior to Palliative referral 8  Clinical Assessment   Psychosocial & Spiritual Assessment   Palliative Care Outcomes        Patient Active Problem List   Diagnosis Date Noted  . HFrEF (heart failure with reduced ejection fraction) (HCC)   . Non-ST elevation (NSTEMI) myocardial infarction (HCC)   . Hypoxia   . Shortness of breath   . Pre-op evaluation   . Abdominal aortic aneurysm (AAA) without rupture (HCC)   . Obesity, Class III, BMI 40-49.9 (morbid obesity) (HCC)   .  Abscess of left hip 01/15/2021  . Acute respiratory failure with hypoxia (HCC) 10/05/2020  . Severe aortic stenosis   . Closed comminuted intertrochanteric fracture of proximal end of left femur (HCC) 10/02/2020  . Aortic  stenosis   . Obesity   . Cellulitis of left lower extremity 04/29/2018  . GI bleed 06/05/2017  . Nausea vomiting and diarrhea   . Acute ST elevation myocardial infarction (STEMI) of inferior wall (HCC) 06/02/2017  . CAD (coronary artery disease) 2018    Palliative Care Assessment & Plan    Recommendations/Plan: This is a difficult situation regarding patient's goals. It is important as discussions are occurring regarding what treatments to pursue, that all teams be candid about expectations for level of improvement in functional status keeping in mind that the only acceptable outcome to her and her family, is being fully independent so that she can go home to live alone.   Code Status:    Code Status Orders  (From admission, onward)           Start     Ordered   01/23/21 1543  Do not attempt resuscitation (DNR)  Continuous       Question Answer Comment  In the event of cardiac or respiratory ARREST Do not call a "code blue"   In the event of cardiac or respiratory ARREST Do not perform Intubation, CPR, defibrillation or ACLS   In the event of cardiac or respiratory ARREST Use medication by any route, position, wound care, and other measures to relive pain and suffering. May use oxygen, suction and manual treatment of airway obstruction as needed for comfort.   Comments MOST form on chart.      01/23/21 1542           Code Status History     Date Active Date Inactive Code Status Order ID Comments User Context   01/15/2021 2309 01/23/2021 1542 Full Code [id number]  [facility] Vernetta Honey, MD Inpatient   10/02/2020 1117 10/09/2020 2150 Full Code [id number]  Lucile Shutters, MD ED   04/30/2018 1018 05/02/2018 1808 DNR [id number]  Adrian Saran, MD Inpatient   04/29/2018 1732 04/30/2018 1018 Full Code [id number]  Houston Siren, MD Inpatient   06/05/2017 0521 06/07/2017 1404 Full Code [id number]  Ihor Austin, MD Inpatient   06/02/2017 0059 06/04/2017 1932 Full Code [id number]  Iran Ouch, MD Inpatient      Advance Directive Documentation    Flowsheet Row Most Recent Value  Type of Advance Directive Healthcare Power of Attorney, Living will  Pre-existing out of facility DNR order (yellow form or pink MOST form) --  "MOST" Form in Place? --       Prognosis:  Poor   Care plan was discussed with primary MD.   Thank you for allowing the Palliative Medicine Team to assist in the care of this patient.   Time In: 9:30 Time Out: 10:40 Total Time 70 min Prolonged Time Billed  yes       Greater than 50%  of this time was spent counseling and coordinating care related to the above assessment and plan.  Morton Stall, NP  Please contact Palliative Medicine Team phone at [phone] for questions and concerns.

## 2021-02-06 NOTE — Progress Notes (Signed)
Progress Note  Patient Name: Monica [name] Date of Encounter: 02/06/2021  Primary Cardiologist: Kirke Corin  Subjective   No chest pain, dyspnea, palpitations, or dizziness. She continues to note abdominal discomfort with associated constipation. Supplemental oxygen requirement down to 6 L.   Inpatient Medications    Scheduled Meds:  (feeding supplement) PROSource Plus  30 mL Oral TID BM   aspirin  81 mg Oral Daily   Chlorhexidine Gluconate Cloth  6 each Topical Daily   clopidogrel  75 mg Oral Daily   docusate sodium  100 mg Oral BID   doxycycline  100 mg Oral Q12H   enoxaparin (LOVENOX) injection  0.5 mg/kg Subcutaneous Q24H   feeding supplement  237 mL Oral TID BM   ipratropium-albuterol  3 mL Nebulization TID   lisinopril  2.5 mg Oral Daily   melatonin  5 mg Oral QHS   methylPREDNISolone (SOLU-MEDROL) injection  40 mg Intravenous Daily   metoprolol succinate  25 mg Oral Daily   multivitamin with minerals  1 tablet Oral Daily   pantoprazole (PROTONIX) IV  40 mg Intravenous Daily   pneumococcal 23 valent vaccine  0.5 mL Intramuscular Tomorrow-1000   polyethylene glycol  17 g Oral Daily   ranolazine  500 mg Oral BID   rosuvastatin  40 mg Oral Daily   sodium chloride flush  10-40 mL Intracatheter Q12H   torsemide  40 mg Oral Daily   Continuous Infusions:  sodium chloride 250 mL (01/29/21 1103)   promethazine (PHENERGAN) injection (IM or IVPB) Stopped (01/26/21 1902)   PRN Meds: acetaminophen, bisacodyl, diazepam, guaiFENesin-dextromethorphan, ipratropium-albuterol, lactulose, lip balm, [DISCONTINUED] metoCLOPramide **OR** metoCLOPramide (REGLAN) injection, morphine injection, nitroGLYCERIN, [DISCONTINUED] promethazine **OR** promethazine (PHENERGAN) injection (IM or IVPB) **OR** promethazine, sodium chloride, sodium chloride flush, traZODone   Vital Signs    Vitals:   02/05/21 1505 02/05/21 1942 02/05/21 2044 02/06/21 0631  BP: (!) 99/56 (!) 85/53  (!) 101/51  Pulse:   81  81  Resp: 18 16  16   Temp: 97.6 F (36.4 C) 97.7 F (36.5 C)  99.1 F (37.3 C)  TempSrc: Axillary     SpO2: 97% 95% 92% 95%  Weight:      Height:        Intake/Output Summary (Last 24 hours) at 02/06/2021 0757 Last data filed at 02/05/2021 1505 Gross per 24 hour  Intake 240 ml  Output 1000 ml  Net -760 ml   Filed Weights   01/30/21 0500 01/31/21 1100 02/01/21 0649  Weight: 98.5 kg 97.4 kg 99.1 kg    Telemetry    SR with PVCs - Personally Reviewed  ECG    No new tracings - Personally Reviewed  Physical Exam   GEN: No acute distress.   Neck: JVD unable to assess secondary to dressing. Cardiac: RRR, III/VI systolic murmur URSB, no rubs, or gallops.  Respiratory: Diminished breath sounds along the bases bilaterally.  GI: Soft, nontender, non-distended.   MS: No edema; No deformity. Neuro:  Alert and oriented x 3; Nonfocal.  Psych: Normal affect.  Labs    Chemistry Recent Labs  Lab 02/02/21 0605 02/03/21 0530 02/04/21 1026 02/05/21 0732 02/06/21 0456  NA 130*   < > 129* 128* 127*  K 3.2*   < > 4.1 3.9 3.7  CL 90*   < > 89* 89* 86*  CO2 30   < > 28 29 30   GLUCOSE 114*   < > 118* 103* 108*  BUN 28*   < > 29*  28* 34*  CREATININE 1.12*   < > 1.24* 1.27* 1.27*  CALCIUM 8.0*   < > 8.5* 8.6* 8.5*  PROT 6.7  --   --   --   --   ALBUMIN 2.7*  --   --  2.9*  --   AST 24  --   --   --   --   ALT 17  --   --   --   --   ALKPHOS 55  --   --   --   --   BILITOT 1.3*  --   --   --   --   GFRNONAA 48*   < > 43* 41* 41*  ANIONGAP 10   < > 12 10 11    < > = values in this interval not displayed.     Hematology Recent Labs  Lab 01/31/21 0404 02/01/21 0630 02/06/21 0456  WBC 9.1 8.1 6.2  RBC 4.52 4.23 4.45  HGB 10.9* 10.4* 11.0*  HCT 33.9* 32.5* 33.4*  MCV 75.0* 76.8* 75.1*  MCH 24.1* 24.6* 24.7*  MCHC 32.2 32.0 32.9  RDW 21.0* 21.7* 23.0*  PLT 288 272 301    Cardiac EnzymesNo results for input(s): TROPONINI in the last 168 hours. No results for  input(s): TROPIPOC in the last 168 hours.   BNP Recent Labs  Lab 01/31/21 1618  BNP 1,191.7*     DDimer No results for input(s): DDIMER in the last 168 hours.   Radiology    No results found.  Cardiac Studies   2D echo 01/29/2021: 1. Left ventricular ejection fraction, by estimation, is 30 to 35%. The  left ventricle has moderately decreased function. The left ventricle  demonstrates regional wall motion abnormalities (see scoring  diagram/findings for description). There is  moderate left ventricular hypertrophy. Left ventricular diastolic  parameters are indeterminate. There is severe hypokinesis of the left  ventricular, mid-apical anteroseptal wall, anterior wall, anterolateral  wall and apical segment.   2. Right ventricular systolic function is normal. The right ventricular  size is mildly enlarged. There is moderately elevated pulmonary artery  systolic pressure.   3. Left atrial size was mildly dilated.   4. The mitral valve is degenerative. Mild mitral valve regurgitation.  Mild mitral stenosis. Moderate mitral annular calcification.   5. The aortic valve is tricuspid. There is moderate calcification of the  aortic valve. There is severe thickening of the aortic valve. Aortic valve  regurgitation is trivial. Severe aortic valve stenosis. Aortic valve area,  by VTI measures 0.54 cm.  Aortic valve mean gradient measures 32.0 mmHg.   6. The inferior vena cava is normal in size with greater than 50%  respiratory variability, suggesting right atrial pressure of 3 mmHg.  Patient Profile     87 y.o. female with history of CAD with inferior STEMI in 05/2017 complicated by hypotension and bradycardia status post PCI/DES to the proximal RCA, severe aortic stenosis, GI bleed in 2018, chronic hypoxic respiratory failure, pulmonary fibrosis, prolonged tobacco use quitting after her MI, HLD, and obesity who was admitted 6/6 for postop hip infection and respiratory failure and  incidentally noted to have a large AAA now status post endovascular repair with postoperative course being complicated by NSTEMI and ongoing respiratory failure requiring significant supplemental oxygen.  Assessment & Plan    1. CAD involving the native coronary arteries with NSTEMI: -No further chest pain -High-sensitivity troponin elevated and flat trending -Status post 48 hours of IV heparin -Continue metoprolol, recently  added ranolazine, and lisinopril -DAPT with ASa and Plavix -After patient discussion with her primary cardiologist, the plan is to proceed with [facility] during this admission, possibly later this week (Dr. Kirke Corin is rounding again on 7/1), hopefully following continued improvement in her respiratory status    2.  HFrEF secondary to ICM: -EF down to 30 to 35% with suggestive of possible recent anterior MI -Appears euvolemic -Continue metoprolol, lisinopril, and torsemide -Further GDMT has been limited in the setting of relative hypotension and especially in light of her severe AS -Plan for [facility] during admission   3.  Acute on chronic hypoxic respiratory failure: -Supplemental oxygen requirement is improving  -Crackles on exam in the setting of fibrosis noted on chest CT this admission -Prior CTA chest was negative for PE this admission  -Wean steroids when able -Management per internal medicine   4.  AAA: -Status post endovascular repair -Currently on aspirin and Plavix -Management per vascular surgery   5.  Severe aortic stenosis: -Overall this is a difficult situation with the patient felt to likely being a poor TAVR candidate in the setting of numerous comorbidities -Mobility needs to improve prior -Initiate workup with R/LHC, possibly later this week   6.  Postoperative hip infection: -Improving -Followed by IM and infectious disease   For questions or updates, please contact [facility] Please consult [email or web address] for contact info under  Cardiology/STEMI.    Signed, Eula Listen, PA-C [facility] Pager: [phone] 02/06/2021, 7:57 AM

## 2021-02-06 NOTE — Progress Notes (Signed)
[facility]  Civil engineer, contracting [facility]) [facility] Liaison Note:  Spoke with Morton Stall, NP related to patient.   Patient is seeking possible cardiac catheterization later in the week and is unsure if she desires hospice services.  We will sign off at this time. Please notify us if patient decides she desires hospice services at a later time.  Please do not hesitate to call for any hospice related questions or concerns.   Thank you,  Bobbie "Einar Gip, RN, BSN [facility] RN [phone]

## 2021-02-06 NOTE — [place] FL2 (Signed)
[place] MEDICAID FL2 LEVEL OF CARE SCREENING TOOL     IDENTIFICATION  Patient Name: Monica [name] Birthdate: 04-19-1934 Sex: female Admission Date (Current Location): 01/15/2021  [place] and [place] Number:  Chiropodist and Address:  [facility], [address], [place], [place] [address]      Provider Number: [id number]  Attending Physician Name and Address:  Lynn Ito, MD  Relative Name and Phone Number:       Current Level of Care: Hospital Recommended Level of Care: Skilled Nursing Facility Prior Approval Number:    Date Approved/Denied:   PASRR Number: [id number]  Discharge Plan: SNF    Current Diagnoses: Patient Active Problem List   Diagnosis Date Noted   HFrEF (heart failure with reduced ejection fraction) (HCC)    Non-ST elevation (NSTEMI) myocardial infarction (HCC)    Hypoxia    Shortness of breath    Pre-op evaluation    Abdominal aortic aneurysm (AAA) without rupture (HCC)    Obesity, Class III, BMI 40-49.9 (morbid obesity) (HCC)    Abscess of left hip 01/15/2021   Acute respiratory failure with hypoxia (HCC) 10/05/2020   Severe aortic stenosis    Closed comminuted intertrochanteric fracture of proximal end of left femur (HCC) 10/02/2020   Aortic stenosis    Obesity    Cellulitis of left lower extremity 04/29/2018   GI bleed 06/05/2017   Nausea vomiting and diarrhea    Acute ST elevation myocardial infarction (STEMI) of inferior wall (HCC) 06/02/2017   CAD (coronary artery disease) 2018    Orientation RESPIRATION BLADDER Height & Weight     Self, Time, Situation, Place  O2 (HFNC 6L) Incontinent Weight: 218 lb 7.6 oz (99.1 kg) Height:  5\' 5"  (165.1 cm)  BEHAVIORAL SYMPTOMS/MOOD NEUROLOGICAL BOWEL NUTRITION STATUS   (None)  (None) Incontinent Diet (DYS 1 diet, thin liquids)  AMBULATORY STATUS COMMUNICATION OF NEEDS Skin   Extensive Assist Verbally Other (Comment) (Closed wound on left hip. open wound right  labia. open wound groin. open wound sacrum)                       Personal Care Assistance Level of Assistance  Dressing, Feeding, Bathing Bathing Assistance: Maximum assistance Feeding assistance: Limited assistance Dressing Assistance: Maximum assistance     Functional Limitations Info  Sight, Hearing, Speech Sight Info: Adequate Hearing Info: Adequate Speech Info: Adequate    SPECIAL CARE FACTORS FREQUENCY  PT (By licensed PT), OT (By licensed OT)     PT Frequency: 5x OT Frequency: 5x            Contractures Contractures Info: Not present    Additional Factors Info  Code Status, Allergies, Isolation Precautions Code Status Info: DNR Allergies Info: no known allergies     Isolation Precautions Info: MRSA     Current Medications (02/06/2021):  This is the current hospital active medication list Current Facility-Administered Medications  Medication Dose Route Frequency Provider Last Rate Last Admin   (feeding supplement) PROSource Plus liquid 30 mL  30 mL Oral TID BM Amery, Sahar, MD   30 mL at 02/06/21 1056   0.9 %  sodium chloride infusion  250 mL Intravenous Continuous 02/08/21, NP 11 mL/hr at 01/29/21 1103 250 mL at 01/29/21 1103   acetaminophen (TYLENOL) tablet 650 mg  650 mg Oral Q6H PRN 01/31/21, MD   650 mg at 02/05/21 2219   aspirin chewable tablet 81 mg  81 mg Oral  Daily Creig Hines, NP   81 mg at 02/06/21 1057   bisacodyl (DULCOLAX) suppository 10 mg  10 mg Rectal Daily PRN Gilda Crease Latina Craver, MD   10 mg at 01/30/21 1031   Chlorhexidine Gluconate Cloth 2 % PADS 6 each  6 each Topical Daily Arnetha Courser, MD   6 each at 02/05/21 1021   clopidogrel (PLAVIX) tablet 75 mg  75 mg Oral Daily Creig Hines, NP   75 mg at 02/06/21 1057   diazepam (VALIUM) injection 2.5 mg  2.5 mg Intravenous Q4H PRN Harlon Ditty D, NP   2.5 mg at 02/05/21 0023   docusate sodium (COLACE) capsule 100 mg  100 mg Oral BID Albina Billet,  RPH   100 mg at 02/06/21 1057   doxycycline (VIBRA-TABS) tablet 100 mg  100 mg Oral Q12H Ravishankar, Rhodia Albright, MD   100 mg at 02/06/21 1056   enoxaparin (LOVENOX) injection 50 mg  0.5 mg/kg Subcutaneous Q24H Ronnald Ramp, RPH   50 mg at 02/05/21 2219   feeding supplement (ENSURE ENLIVE / ENSURE PLUS) liquid 237 mL  237 mL Oral TID BM Arnetha Courser, MD   237 mL at 02/06/21 1055   guaiFENesin-dextromethorphan (ROBITUSSIN DM) 100-10 MG/5ML syrup 5 mL  5 mL Oral Q4H PRN Lynn Ito, MD   5 mL at 02/04/21 2158   ipratropium-albuterol (DUONEB) 0.5-2.5 (3) MG/3ML nebulizer solution 3 mL  3 mL Nebulization Q4H PRN Schnier, Latina Craver, MD       ipratropium-albuterol (DUONEB) 0.5-2.5 (3) MG/3ML nebulizer solution 3 mL  3 mL Nebulization TID Lynn Ito, MD   3 mL at 02/06/21 0806   lactulose (CHRONULAC) 10 GM/15ML solution 10 g  10 g Oral Daily PRN Arnetha Courser, MD   10 g at 02/04/21 1517   lip balm (BLISTEX) ointment   Topical PRN Sharen Hones, RPH       lisinopril (ZESTRIL) tablet 2.5 mg  2.5 mg Oral Daily Agbor-Etang, Arlys John, MD   2.5 mg at 02/06/21 1124   melatonin tablet 5 mg  5 mg Oral QHS Manuela Schwartz, NP   5 mg at 02/05/21 2219   methylPREDNISolone sodium succinate (SOLU-MEDROL) 40 mg/mL injection 40 mg  40 mg Intravenous Daily Vida Rigger, MD   40 mg at 02/06/21 1056   metoCLOPramide (REGLAN) injection 5-10 mg  5-10 mg Intravenous Q8H PRN Schnier, Latina Craver, MD   10 mg at 01/28/21 1028   metoprolol succinate (TOPROL-XL) 24 hr tablet 25 mg  25 mg Oral Daily Lynn Ito, MD   25 mg at 02/06/21 1124   morphine 2 MG/ML injection 1-2 mg  1-2 mg Intravenous Q4H PRN Harlon Ditty D, NP   2 mg at 02/05/21 1034   multivitamin with minerals tablet 1 tablet  1 tablet Oral Daily Schnier, Latina Craver, MD   1 tablet at 02/06/21 1056   nitroGLYCERIN (NITROSTAT) SL tablet 0.4 mg  0.4 mg Sublingual Q5 min PRN Schnier, Latina Craver, MD   0.4 mg at 01/29/21 1106   pantoprazole (PROTONIX) injection 40 mg   40 mg Intravenous Daily Schnier, Latina Craver, MD   40 mg at 02/06/21 1056   pneumococcal 23 valent vaccine (PNEUMOVAX-23) injection 0.5 mL  0.5 mL Intramuscular Tomorrow-1000 Arnetha Courser, MD       polyethylene glycol (MIRALAX / GLYCOLAX) packet 17 g  17 g Oral Daily Arnetha Courser, MD   17 g at 02/06/21 1056   promethazine (PHENERGAN) 6.25 mg in sodium chloride 0.9 %  50 mL IVPB  6.25 mg Intravenous Q6H PRN Schnier, Latina Craver, MD   Stopped at 01/26/21 1902   Or   promethazine (PHENERGAN) suppository 25 mg  25 mg Rectal Q6H PRN Schnier, Latina Craver, MD   25 mg at 01/23/21 1104   ranolazine (RANEXA) 12 hr tablet 500 mg  500 mg Oral BID End, Christopher, MD   500 mg at 02/06/21 1057   rosuvastatin (CRESTOR) tablet 40 mg  40 mg Oral Daily Ronnald Ramp, RPH   40 mg at 02/06/21 1057   sodium chloride (OCEAN) 0.65 % nasal spray 1 spray  1 spray Each Nare PRN Lynn Ito, MD   1 spray at 02/04/21 1735   sodium chloride flush (NS) 0.9 % injection 10-40 mL  10-40 mL Intracatheter Q12H Arnetha Courser, MD   3 mL at 02/06/21 1103   sodium chloride flush (NS) 0.9 % injection 10-40 mL  10-40 mL Intracatheter PRN Arnetha Courser, MD   30 mL at 01/29/21 2144   torsemide (DEMADEX) tablet 40 mg  40 mg Oral Daily Debbe Odea, MD   40 mg at 02/06/21 1056   traZODone (DESYREL) tablet 25 mg  25 mg Oral QHS PRN Mansy, Vernetta Honey, MD         Discharge Medications: Please see discharge summary for a list of discharge medications.  Relevant Imaging Results:  Relevant Lab Results:   Additional Information SSN:[id number]. Not vaccinated.  Richardson Dubree A Dorrian Doggett, LCSW

## 2021-02-06 NOTE — Progress Notes (Addendum)
[facility] [address]  [employer], [employer] [facility]) [facility] Liaison Note:  Received request for [facility] Outpatient Palliative services after discharge.   [facility] hospital liaison will follow patient for discharge disposition.   Please call with any hospice or outpatient palliative care related questions.   Thank you for the opportunity to participate in this patient's care.   Bobbie "Einar Gip, RN, BSN [facility] Liaison [phone]

## 2021-02-06 NOTE — Care Management Important Message (Signed)
Important Message  Patient Details  Name: Monica [name] MRN: [id number] Date of Birth: 02-Jul-1934   Medicare Important Message Given:  Other (see comment)   Plan to discharge to Hospice Home pending eligibility.  Medicare IM withheld at this time.     [name] 02/06/2021, 8:42 AM

## 2021-02-06 NOTE — TOC Progression Note (Signed)
Transition of Care [facility]) - Progression Note    Patient Details  Name: Monica [name] MRN: [id number] Date of Birth: [birth year]  Transition of Care [facility]) CM/SW Contact  Maree Krabbe, LCSW Phone Number: 02/06/2021, 12:03 PM  Clinical Narrative:   Pt does not want to pursue hospice at this time. Would prefer palliative at SNF. CSW has resent [facility] a updated FL2- per previous note pt wanted [facility].    Expected Discharge Plan: Skilled Nursing Facility Barriers to Discharge: Continued Medical Work up  Expected Discharge Plan and Services Expected Discharge Plan: Skilled Nursing Facility     Post Acute Care Choice: Skilled Nursing Facility Living arrangements for the past 2 months: Single Family Home                                       Social Determinants of Health (SDOH) Interventions    Readmission Risk Interventions No flowsheet data found.

## 2021-02-06 NOTE — Progress Notes (Signed)
PROGRESS NOTE    Monica [name]  PPJ:[id number] DOB: 23-Dec-1933 DOA: 01/15/2021 PCP: Patient, No Pcp Per (Inactive)   Brief Narrative:  Monica [name] is a 87 y.o. F with CAD s/p PCI x1 in 2018, severe AS being worked up for TAVR, Obesity, pHTN, and COPD who presented with hip pain.   Had hip fracture after fall in Feb, repaired and discharged home.  Since being home 4 months ago, really never able to walk due to pain. Finally, in the last week before admission, developed redness and worse pain at left hip surgical site, then wound dehisced.   Presented to the ER and Orthopedics were consulted for suspected post-surgical hip infection with MRSA.  ID is recommending 4 weeks of vancomycin and rifampin.    Also noted incidentally to have very large AAA, she was taken to the OR for endovascular repair with vascular surgery today.  Also had severe aortic stenosis and plan is for TAVR work-up after AAA repair, most likely as an outpatient if able to get out of hospital from current admission.  She will not be a good candidate due to multiple comorbidities.  Patient with multiple life-threatening comorbidities-high risk for deterioration and death, palliative care was also consulted, patient is now on DNR/DNI.  She will consider comfort care if deteriorates. At this time we will continue current level of care.  Patient had her endovascular AAA repair done with vascular surgery on 01/24/2021, she remained intubated and in ICU for 2 days after that, extubated yesterday and we will resume care again from today.  Remained on heated high flow, weak cough and appears very lethargic.  6/18: Appears more lethargic with worsening oxygen requirement, today at 50 L and 65% FiO2  6/19: Little improvement in FiO2, started taking few sips here and there.  6/20: Patient remains on heated high flow at 55 L with 50% FiO2.  Developed chest pain which he responded to nitroglycerin.  Troponin elevated at 1374, EKG with  nonspecific T wave changes.  Concern of NSTEMI.  Cardiology was reconsulted, they are recommending conservative management.  She was started on heparin infusion after consulting vascular surgery as she recently had her endovascular AAA repair.  6/21: Echocardiogram with newly reduced EF to 30 to 35% with regional wall motion abnormalities consistent with recent anterior MI.  Not a candidate for cardiac catheterization.  Medical management per cardiology.  Remains on high level of oxygen with heated high flow.  Palliative care again met her but she wants to keep pushing, prognosis is very guarded due to multiple life limiting comorbidities.  Very high risk for deterioration and death.  Dr. Lupita Dawn Week: 01/2027/07/18-patient was requiring lots of high flow oxygen.  She is anywhere from 7-13.  Has been trying to wean down.  Was started on IV Lasix then changed to p.o. diuretics by cardiology.  Palliative care has been following.  Family has been pushing for hospice with home hospice.  Patient has been resistant about hospice.  Cardiology is following for her cardiac issues and aortic stenosis.   Now she is getting PT OT back time she declines.  She is supposed to go to SNF ultimately.  Ultimate issues, is she going to SNF, with cadiac /TAVR evaluation eventually in future v.s. hospice house   Subjective: No sob, or cp.    Assessment & Plan:   Active Problems:   Abscess of left hip   Abdominal aortic aneurysm (AAA) without rupture (HCC)   Obesity, Class III, BMI 40-49.9 (  morbid obesity) ([place])   Pre-op evaluation   Shortness of breath   Hypoxia   Non-ST elevation (NSTEMI) myocardial infarction (HCC)   HFrEF (heart failure with reduced ejection fraction) ([place])  Chest pain/NSTEMI.   Concern of NSTEMI with markedly elevated troponin.  Chest pain responded to nitroglycerin.  History of CAD with PCI to RCA.  Echocardiogram with new onset of reduced EF with regional wall motion abnormalities consistent  with recent anterior MI. Cardiology was reconsulted and they are recommending conservative management. Cardiology started her on heparin infusion after consulting vascular surgery. continue aspirin, Plavix x12 months  6/26-continue Ranexa  Medical management only, continue aspirin, Plavix x12 months Completed 48 hours of heparin drip No invasive work-up Beta-blockers 6/28 cardiology saw patient and yesterday Dr. Velva Harman recommended right and left heart cath as patient making slow improvement and she is not ready for hospice.  Patient is unsure if she desires hospice services and hospice signed off  Will follow-up with cardiology for further recommendations    Acute on chronic combined diastolic and systolic HF Cards following Switched lasix to Torsemide Decresae torsemide to 29m qd as creat. Up Continue toprol xl. Lisinopril 2.568mqd 6/28 cardiology plan for left and right heart cath later this week we will follow-up for further recommendation      Severe aortic stenosis Previously known, since hip fracture in Feb.  Was pending TAVR eval prior to this admission, seen by Cardiology here. 6/28 possible TAVR outpatient was considered but not a great candidate.  However cardiology yesterday stated she will require TAVR evaluation eventually.     Lt brachial access Swelling: LUE swollen around surgical site.  6/28 vascular evaluate this, likely small hematoma.  Palpable radial pulse.  No concerns over access site.  It is improving.    Vascular evaluated this, likely small hematoma.  Palpable radial    Postsurgical hip infection S/p debridement of the left hip wound by Dr. PaPosey Pronto6/7 Admitted and taken to OR 6/7 for washout.  Intraoperative cultures growing MRSA in 3/3. ID consulted, recommended treating with 4 weeks antibiotics for presumed MRSA prosthetic infection.  1/2 blood cultures positive for staph epidermidis which was thought to be a contaminant. -Orthopedic remove the suture  and drain today-no obvious sign of infection according to their note. 6/25 per ID looks like infection of soft tissue did not do bone as the I&D has healed.   She already received 17 days of IV vancomycin  Can change to p.o. doxycycline  Would stop rifampin on 02/06/2021   Lower abdominal pain.  Operative sites looks okay.   Possibly due to constipation.   Continue bowel regimen   Hypokalemia- Replaced and stable     Hypotension due to aortic stenosis Continue midodrine    Acute on chronic hypoxic respiratory failure.  Mild improvement in FiO2 requirement today.  Otherwise remained on heated high flow at 45 L and 50% FiO2. Has chronic hypoxic respiratory failure due to aortic stenosis, heart failure, and pulmonary hypertension.  COPD is in chart but appears to be mild. Former smoker.  Uses 2 L of oxygen at baseline.  Initially requiring 2 to 3 L and then got worse requiring up to 10 L starting from 6/12. Chest x-ray unremarkable, CT angiogram of the chest with patchy subpleural fibrosis, no PE, moderate atelectasis, no edema or large consolidation, no effusion. Patient required prolonged intubation after the procedure, extubated but continued to require very high levels of oxygen. 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  Patient was evaluated by vascular surgery, who recommended urgent repair given the size.  She is not a candidate for open repair.  She was taken to the OR for endovascular repair-tolerated the procedure well, stable from vascular standpoint and will follow-up as an outpatient. Vascular was following.   Iron deficiency anemia Hgb stable Given Feraheme 6/10 -Transfusion threshold 8 g/dL  Hyponatremia.   Mild,  asymptomatic  Vaginal candidiasis Given Fluconazole 1 dose on 6/10.   Left renal nodule Incidental finding, minor, given gravity of AAA and AS and hypoxia, this likely has not been discussed with patient and should be followed up only depending on goals of care after AAA repair - Follow-up MRI as an outpatient  Class II obesity. Estimated body mass index is 36.36 kg/m as calculated from the following:   Height as of this encounter: 5' 5" (1.651 m).   Weight as of this encounter: 99.1 kg.   Goals of care.hospice to evaluate patient for home hospice versus hospice house  Objective: Vitals:   02/05/21 1942 02/05/21 2044 02/06/21 0631 02/06/21 0806  BP: (!) 85/53  (!) 101/51   Pulse: 81  81   Resp: 16  16   Temp: 97.7 F (36.5 C)  99.1 F (37.3 C)   TempSrc:      SpO2: 95% 92% 95% 91%  Weight:      Height:        Intake/Output Summary (Last 24 hours) at 02/06/2021 0819 Last data filed at 02/05/2021 1505 Gross per 24 hour  Intake 240 ml  Output 1000 ml  Net -760 ml   Filed Weights   01/30/21 0500 01/31/21 1100 02/01/21 0649  Weight: 98.5 kg 97.4 kg 99.1 kg    Examination: Lying in bed, NAD Decreased breath sounds at bases no wheezing  regular S1-S2 no gallops Soft obese, positive bowel sounds No edema Alert oriented x4 Mood and affect appropriate in current setting   DVT prophylaxis: Lovenox Code Status: DNR Family Communication: none at bedside Disposition Plan:  Status is: Inpatient  Remains inpatient appropriate because:Inpatient level of care appropriate due to severity of illness  Dispo: The patient is from:  Home              Anticipated d/c is to:  To be determined              Patient currently is not medically stable to d/c.   Difficult to place patient No                Level of care: Progressive Cardiac  Consultants:  Vascular surgery Pulmonology Cardiology  Procedures:  Antimicrobials:  Vancomycin..>dc 6/24 Rifampin>> Doxycycline  6/24>>>  Data Reviewed: I have personally reviewed following labs and imaging studies  CBC: Recent Labs  Lab 01/31/21 0404 02/01/21 0630 02/06/21 0456  WBC 9.1 8.1 6.2  HGB 10.9* 10.4* 11.0*  HCT 33.9* 32.5* 33.4*  MCV 75.0* 76.8* 75.1*  PLT 288 272 301   Basic Metabolic Panel: Recent Labs  Lab 02/02/21 0605 02/03/21 0530 02/04/21 0537 02/04/21 1026 02/05/21 0732 02/06/21 0456  NA 130* 127* 130* 129* 128* 127*  K 3.2* 3.9  --  4.1 3.9 3.7  CL 90* 87*  --  89* 89* 86*  CO2 30 33*  --    28 29 30  GLUCOSE 114* 128*  --  118* 103* 108*  BUN 28* 28*  --  29* 28* 34*  CREATININE 1.12* 1.17*  --  1.24* 1.27* 1.27*  CALCIUM 8.0* 8.1*  --  8.5* 8.6* 8.5*  PHOS  --   --   --   --  3.7  --    GFR: Estimated Creatinine Clearance: 37.7 mL/min (A) (by C-G formula based on SCr of 1.27 mg/dL (H)). Liver Function Tests: Recent Labs  Lab 02/02/21 0605 02/05/21 0732  AST 24  --   ALT 17  --   ALKPHOS 55  --   BILITOT 1.3*  --   PROT 6.7  --   ALBUMIN 2.7* 2.9*   No results for input(s): LIPASE, AMYLASE in the last 168 hours. No results for input(s): AMMONIA in the last 168 hours. Coagulation Profile: No results for input(s): INR, PROTIME in the last 168 hours.  Cardiac Enzymes: No results for input(s): CKTOTAL, CKMB, CKMBINDEX, TROPONINI in the last 168 hours. BNP (last 3 results) No results for input(s): PROBNP in the last 8760 hours. HbA1C: No results for input(s): HGBA1C in the last 72 hours. CBG: Recent Labs  Lab 02/04/21 2005  GLUCAP 126*   Lipid Profile: No results for input(s): CHOL, HDL, LDLCALC, TRIG, CHOLHDL, LDLDIRECT in the last 72 hours. Thyroid Function Tests: No results for input(s): TSH, T4TOTAL, FREET4, T3FREE, THYROIDAB in the last 72 hours. Anemia Panel: No results for input(s): VITAMINB12, FOLATE, FERRITIN, TIBC, IRON, RETICCTPCT in the last 72 hours. Sepsis Labs: No results for input(s): PROCALCITON, LATICACIDVEN in the last 168 hours.  No  results found for this or any previous visit (from the past 240 hour(s)).    Radiology Studies: No results found.  Scheduled Meds:  (feeding supplement) PROSource Plus  30 mL Oral TID BM   aspirin  81 mg Oral Daily   Chlorhexidine Gluconate Cloth  6 each Topical Daily   clopidogrel  75 mg Oral Daily   docusate sodium  100 mg Oral BID   doxycycline  100 mg Oral Q12H   enoxaparin (LOVENOX) injection  0.5 mg/kg Subcutaneous Q24H   feeding supplement  237 mL Oral TID BM   ipratropium-albuterol  3 mL Nebulization TID   lisinopril  2.5 mg Oral Daily   melatonin  5 mg Oral QHS   methylPREDNISolone (SOLU-MEDROL) injection  40 mg Intravenous Daily   metoprolol succinate  25 mg Oral Daily   multivitamin with minerals  1 tablet Oral Daily   pantoprazole (PROTONIX) IV  40 mg Intravenous Daily   pneumococcal 23 valent vaccine  0.5 mL Intramuscular Tomorrow-1000   polyethylene glycol  17 g Oral Daily   ranolazine  500 mg Oral BID   rosuvastatin  40 mg Oral Daily   sodium chloride flush  10-40 mL Intracatheter Q12H   torsemide  40 mg Oral Daily   Continuous Infusions:  sodium chloride 250 mL (01/29/21 1103)   promethazine (PHENERGAN) injection (IM or IVPB) Stopped (01/26/21 1902)     LOS: 22 days   Time spent:  35 minutes with >50% on coc    , MD [facility]  If 7PM-7AM, please contact night-coverage [email or web address]   

## 2021-02-07 LAB — BASIC METABOLIC PANEL
Anion gap: 11 (ref 5–15)
BUN: 40 mg/dL — ABNORMAL HIGH (ref 8–23)
CO2: 31 mmol/L (ref 22–32)
Calcium: 8.8 mg/dL — ABNORMAL LOW (ref 8.9–10.3)
Chloride: 87 mmol/L — ABNORMAL LOW (ref 98–111)
Creatinine, Ser: 1.38 mg/dL — ABNORMAL HIGH (ref 0.44–1.00)
GFR, Estimated: 38 mL/min — ABNORMAL LOW (ref >60)
Glucose, Bld: 112 mg/dL — ABNORMAL HIGH (ref 70–99)
Potassium: 3.7 mmol/L (ref 3.5–5.1)
Sodium: 129 mmol/L — ABNORMAL LOW (ref 135–145)

## 2021-02-07 MED ORDER — PANTOPRAZOLE SODIUM 40 MG PO TBEC
40.0000 mg | DELAYED_RELEASE_TABLET | Freq: Every day | ORAL | Status: DC
  Administered 2021-02-08 – 2021-02-10 (×3): 40 mg via ORAL
  Filled 2021-02-07 (×3): qty 1

## 2021-02-07 MED ORDER — PREDNISONE 20 MG PO TABS
20.0000 mg | ORAL_TABLET | Freq: Every day | ORAL | Status: DC
  Administered 2021-02-08 – 2021-02-10 (×3): 20 mg via ORAL
  Filled 2021-02-07 (×3): qty 1

## 2021-02-07 MED ORDER — ONDANSETRON HCL 4 MG/2ML IJ SOLN: 4.0000 mg | Freq: Four times a day (QID) | INTRAMUSCULAR | Status: DC | PRN

## 2021-02-07 MED ORDER — HYDROMORPHONE HCL 1 MG/ML IJ SOLN
1.0000 mg | Freq: Four times a day (QID) | INTRAMUSCULAR | Status: DC | PRN
  Administered 2021-02-07 – 2021-02-09 (×5): 1 mg via INTRAVENOUS
  Filled 2021-02-07 (×5): qty 1

## 2021-02-07 NOTE — Progress Notes (Signed)
[facility]  - [place] [facility]   PATIENT NAME: Monica [name]    MR#:  [id number]  DATE OF BIRTH:  Jan 10, 1934  SUBJECTIVE:  overall improving. Has some chronic pain issues mainly in the abdomen. Had bowel movement. Patient was constipated. Agreeable with cardiac cath later this week. No family in the room.  REVIEW OF SYSTEMS:   Review of Systems  Constitutional:  Negative for chills, fever and weight loss.  HENT:  Negative for ear discharge, ear pain and nosebleeds.   Eyes:  Negative for blurred vision, pain and discharge.  Respiratory:  Negative for sputum production, shortness of breath, wheezing and stridor.   Cardiovascular:  Negative for chest pain, palpitations, orthopnea and PND.  Gastrointestinal:  Positive for abdominal pain and constipation. Negative for diarrhea, nausea and vomiting.  Genitourinary:  Negative for frequency and urgency.  Musculoskeletal:  Positive for joint pain. Negative for back pain.  Neurological:  Positive for weakness. Negative for sensory change, speech change and focal weakness.  Psychiatric/Behavioral:  Negative for depression and hallucinations. The patient is not nervous/anxious.   Tolerating Diet: Tolerating PT:   DRUG ALLERGIES:  No Known Allergies  VITALS:  Blood pressure 106/67, pulse 86, temperature 97.9 F (36.6 C), temperature source Oral, resp. rate 18, height 5\' 5"  (1.651 m), weight 99.1 kg, SpO2 92 %.  PHYSICAL EXAMINATION:   Physical Exam  GENERAL:  87 y.o.-year-old patient lying in the bed with no acute distress.  Chronically ill LUNGS: decreased breath sounds bilaterally, no wheezing, rales, rhonchi. No use of accessory muscles of respiration.  CARDIOVASCULAR: S1, S2 normal. No murmurs, rubs, or gallops.  ABDOMEN: Soft, nontender, nondistended. Bowel sounds present. No organomegaly or mass.  EXTREMITIES: no edema b/l.    NEUROLOGIC: nonfocal, generalized weakness PSYCHIATRIC:  patient is alert and  oriented x 3.  SKIN: No obvious rash, lesion, or ulcer.   LABORATORY PANEL:  CBC Recent Labs  Lab 02/06/21 0456  WBC 6.2  HGB 11.0*  HCT 33.4*  PLT 301    Chemistries  Recent Labs  Lab 02/02/21 0605 02/03/21 0530 02/07/21 0448  NA 130*   < > 129*  K 3.2*   < > 3.7  CL 90*   < > 87*  CO2 30   < > 31  GLUCOSE 114*   < > 112*  BUN 28*   < > 40*  CREATININE 1.12*   < > 1.38*  CALCIUM 8.0*   < > 8.8*  AST 24  --   --   ALT 17  --   --   ALKPHOS 55  --   --   BILITOT 1.3*  --   --    < > = values in this interval not displayed.   Cardiac Enzymes No results for input(s): TROPONINI in the last 168 hours. RADIOLOGY:  No results found. ASSESSMENT AND PLAN:  87 y.o. female with history of CAD with inferior STEMI in 05/2017 complicated by hypotension and bradycardia status post PCI/DES to the proximal RCA, severe aortic stenosis, GI bleed in 2018, chronic hypoxic respiratory failure, pulmonary fibrosis, prolonged tobacco use quitting after her MI, HLD, and obesity who was admitted 6/6 for postop hip infection and respiratory failure and incidentally noted to have a large AAA now status post endovascular repair with postoperative course being complicated by NSTEMI and ongoing respiratory failure requiring significant supplemental oxygen.  Non-STEMI with markedly elevated troponin -- patient followed by [facility] cardiology. -- recommendations for right and  left heart catheterization to evaluate aortic valve on Friday -- continue aspirin, Plavix, lisinopril and beta-blockers, Crestor and ranexa.  Acute on chronic combined diastolic/systolic heart failure acute on chronic hypoxic respiratory failure -- patient now on torsemide -- continue beta-blockers and lisinopril -- change to oral prednisone -- patient now on nasal cannula oxygen --CT angiogram of the chest with patchy subpleural fibrosis, no PE, moderate atelectasis, no edema or large consolidation, no effusion  severe aortic  stenosis -- patient to get heart cath on Friday to determine severity of AS -- will await cardiology evaluation if she is a candidate for TAVR  left hip post op surgical infection status post debridement by orthopedic Dr. Allena Katz on June 7 -- per infectious disease continue PO doxycycline-- stop date 02/15/21 -- finished 17 days of IV vancomycin and completed rifampin  constipation/lower abdominal pain -- patient had BM  AAA 8.2 cm -- status post endovascular repair  Left renal nodule Incidental finding, minor, given gravity of AAA and AS and hypoxia, this likely has not been discussed with patient and should be followed up only depending on goals of care after AAA repair - Follow-up MRI as an outpatient  Class II obesity. Estimated body mass index is 36.36 kg/m as calculated from the following:   Height as of this encounter: 5\' 5"  (1.651 m).   Weight as of this encounter: 99.1 kg.    Consults : cardiology, ID, palliative care CODE STATUS: DNR DNI DVT Prophylaxis : Lovenox Level of care: Progressive Cardiac Status is: Inpatient  Remains inpatient appropriate because:Inpatient level of care appropriate due to severity of illness  Dispo: The patient is from: Home              Anticipated d/c is to: SNF              Patient currently is not medically stable to d/c.   Difficult to place patient No   TOC for discharge planning. PT OT recommends rehab. Will get cardiac cath on Friday and likely discharge early next week depending on clinical stability     TOTAL TIME TAKING CARE OF THIS PATIENT: 25 minutes.  >50% time spent on counselling and coordination of care  Note: This dictation was prepared with Dragon dictation along with smaller phrase technology. Any transcriptional errors that result from this process are unintentional.  Sunday M.D    [facility]   CC: Primary care physician; Patient, No Pcp Per (Inactive) Patient ID: Monica [name], female   DOB:  1934/01/06, 87 y.o.   MRN: [id number]

## 2021-02-07 NOTE — Evaluation (Signed)
Physical Therapy Re-Evaluation Patient Details Name: Monica [name] MRN: [id number] DOB: 09-26-1933 Today's Date: 02/07/2021   History of Present Illness  87 y.o. female with h/o fall with L hip fx and subsequent IM nailing ORIF 2/22..  Pt now admitted with L hip severe cellulitis and likely abscess, s/p  L hip wound I&D and excisional debridement of muscle and fascia 6/7.   Addtionally, CT scan showed a large infrarenal aortic aneurysm & pt is now s/p endovascular repair of AAA on 01/24/21 requiring t/f to ICU And ramained intubated until 6/16, requiring HHNC and at time of re-eval 6L O2 PMH includes CAD, HLD, obesity, aortic stenosis, CAD, GI bleed, acute respiratory failure with hypoxia, STEMI.  Clinical Impression  Pt pleasant and eager to participate with PT.  She c/o more pain with palpation/movement of ankles/lower leg than in L hip, though still with some hesitancy.  She was able to do surprisingly well with A/AAROM exercises on the L and showed seemingly functional strength on the R.  She was able to show good effort with mobility and we were able to maintain sitting EOB >10 minutes w/ intermittent UE support, variable O2 (sats from low 80s to high 90s per effort, self-reported nervousness and ability to maintain appropriate nasal breathing).  Attempts at standing were unproductive as pt could not generate any effort through LEs that translated to lifting hips off bed, even with heavy assist from PT and plenty of set up and cuing she was not at all close to able to rise.  Pt still very weak and functionally limited, but given recent lack of activity did surprisingly well.  Will benefit from continued PT to get back to more functional level.    Follow Up Recommendations SNF;Supervision/Assistance - 24 hour    Equipment Recommendations   (TBD next venue of care)    Recommendations for Other Services       Precautions / Restrictions Precautions Precautions: Fall Restrictions Weight Bearing  Restrictions: Yes LLE Weight Bearing: Weight bearing as tolerated      Mobility  Bed Mobility Overal bed mobility: Needs Assistance Bed Mobility: Supine to Sit Rolling: Min assist   Supine to sit: Mod assist Sit to supine: Mod assist   General bed mobility comments: Pt showed great effort with mobility, did ultimately need assist considerable assist but did more on her own than she or this PT expected    Transfers Overall transfer level: Needs assistance Equipment used: Rolling walker (2 wheeled) Transfers: Sit to/from Stand Sit to Stand: Total assist         General transfer comment: attempted sit to stand X2 with little success.  Pt unable to meaningfully shift weight onto LEs or elevate hips of bed at all (despite heavy assist, plenty of cuing for set up and sequencing, etc)  Ambulation/Gait                Stairs            Wheelchair Mobility    Modified Rankin (Stroke Patients Only)       Balance Overall balance assessment: Needs assistance Sitting-balance support: Feet supported Sitting balance-Leahy Scale: Good     Standing balance support: Bilateral upper extremity supported Standing balance-Leahy Scale: Zero                               Pertinent Vitals/Pain Pain Assessment: Faces Faces Pain Scale: Hurts little more Pain Location: b/l ankle  PROM toward DF/neutral Pain Descriptors / Indicators: Grimacing;Tender;Sore Pain Intervention(s): Limited activity within patient's tolerance;Repositioned    Home Living Family/patient expects to be discharged to:: Skilled nursing facility Living Arrangements: Children Available Help at Discharge: Family;Available PRN/intermittently Type of Home: House Home Access: Stairs to enter   Entrance Stairs-Number of Steps: 4+3 Home Layout: One level Home Equipment: Walker - 2 wheels;Cane - single point;Bedside commode;Hospital bed;Wheelchair - manual Additional Comments: apparently her  borrowed w/c is not in good condition    Prior Function Level of Independence: Needs assistance   Gait / Transfers Assistance Needed: Pt ambulatory prior to surgery in February 2022; but since that time pt only able to pivot to [facility] with assist from family (hospital bed to/from [facility]); apparently has not even pivoted to w/c at home yet  ADL's / Homemaking Assistance Needed: States she has a PCA that helps 3x wk with bathing. States her dtr performs most IADLs. States her son runs errands/drives her.  Comments: PLOF per chart     Hand Dominance   Dominant Hand: Right    Extremity/Trunk Assessment   Upper Extremity Assessment Upper Extremity Assessment: Generalized weakness    Lower Extremity Assessment Lower Extremity Assessment: Generalized weakness (L  LE grossly 3/5, R grossly 4/5.  R ankle lacks DF(5-10 deg from neutral))       Communication   Communication: HOH  Cognition Arousal/Alertness: Awake/alert Behavior During Therapy: WFL for tasks assessed/performed Overall Cognitive Status: Within Functional Limits for tasks assessed                                        General Comments      Exercises General Exercises - Lower Extremity Ankle Circles/Pumps: AROM;Both;10 reps;Supine (lacking 5-10 deg DF from neutral) Heel Slides: AROM;Strengthening;Supine;Both;AAROM;5 reps (AAROM on L, AROM on R, light resistance for leg ext b/l) Hip ABduction/ADduction: AROM;Strengthening;10 reps (AROM on L, light resistance on R) Straight Leg Raises: AAROM;AROM;5 reps (AAROM on L, AROM on R)    Assessment/Plan    PT Assessment Patient needs continued PT services  PT Problem List Decreased strength;Decreased range of motion;Decreased activity tolerance;Decreased mobility;Decreased knowledge of use of DME;Pain;Decreased skin integrity;Decreased safety awareness;Decreased knowledge of precautions;Obesity;Decreased cognition;Decreased balance;Cardiopulmonary status limiting  activity       PT Treatment Interventions DME instruction;Functional mobility training;Therapeutic activities;Therapeutic exercise;Balance training;Patient/family education;Wheelchair mobility training;Gait training;Stair training;Cognitive remediation;Neuromuscular re-education;Modalities    PT Goals (Current goals can be found in the Care Plan section)  Acute Rehab PT Goals Patient Stated Goal: get back to standing PT Goal Formulation: With patient Time For Goal Achievement: 02/21/21 Potential to Achieve Goals: Fair    Frequency Min 2X/week   Barriers to discharge Decreased caregiver support;Inaccessible home environment      Co-evaluation               AM-PAC PT "6 Clicks" Mobility  Outcome Measure Help needed turning from your back to your side while in a flat bed without using bedrails?: A Lot Help needed moving from lying on your back to sitting on the side of a flat bed without using bedrails?: A Lot Help needed moving to and from a bed to a chair (including a wheelchair)?: Total Help needed standing up from a chair using your arms (e.g., wheelchair or bedside chair)?: Total Help needed to walk in hospital room?: Total Help needed climbing 3-5 steps with a railing? : Total 6 Click  Score: 8    End of Session Equipment Utilized During Treatment: Oxygen (6L) Activity Tolerance: Patient limited by pain;Patient limited by fatigue Patient left: in bed;with call bell/phone within reach;with bed alarm set;with family/visitor present Nurse Communication: Mobility status PT Visit Diagnosis: Muscle weakness (generalized) (M62.81);Pain;Other abnormalities of gait and mobility (R26.89);Difficulty in walking, not elsewhere classified (R26.2) Pain - Right/Left: Left Pain - part of body: Hip    Time: 1610-9604 PT Time Calculation (min) (ACUTE ONLY): 41 min   Charges:   PT Evaluation $PT Re-evaluation: 1 Re-eval PT Treatments $Therapeutic Exercise: 8-22 mins $Therapeutic  Activity: 8-22 mins        Malachi Pro, DPT 02/07/2021, 5:12 PM

## 2021-02-07 NOTE — Evaluation (Signed)
Occupational Therapy Re-Evaluation Patient Details Name: Monica [name] MRN: [id number] DOB: [birth year] Today's Date: 02/07/2021    History of Present Illness 87 y.o. female with h/o fall with L hip fx and subsequent IM nailing ORIF 2/22..  Pt now admitted with L hip severe cellulitis and likely abscess, s/p  L hip wound I&D and excisional debridement of muscle and fascia 6/7.   Addtionally, CT scan showed a large infrarenal aortic aneurysm & pt is now s/p endovascular repair of AAA on 01/24/21 requiring t/f to ICU And ramained intubated until 6/16, requiring HHNC and at time of re-eval 6L O2 PMH includes CAD, HLD, obesity, aortic stenosis, CAD, GI bleed, acute respiratory failure with hypoxia, STEMI.   Clinical Impression   Monica [name] was seen for OT re-evaluation on this date. Upon arrival to room pt reclined in bed reporting fatigue following PT, agreeable to bathing session. Pt requires MAX A doff B socks at bed level. MOD A for bathing bed level - assist for BLE and buttocks. MIN A rolling L+R for pericare at bed level. Goals updated to reflect progress. Pt continues to benefit from skilled OT services to maximize return to PLOF and minimize risk of future falls, injury, caregiver burden, and readmission. Frequency and discharge recommendations updated.      Follow Up Recommendations  SNF    Equipment Recommendations  Other (comment) (defer)    Recommendations for Other Services       Precautions / Restrictions Precautions Precautions: Fall Restrictions Weight Bearing Restrictions: Yes LLE Weight Bearing: Weight bearing as tolerated      Mobility Bed Mobility Overal bed mobility: Needs Assistance Bed Mobility: Rolling Rolling: Min assist         General bed mobility comments: pt deferred further mobility citing fatigue from recent PT session    Transfers                 General transfer comment: deferred        ADL either performed or assessed with clinical  judgement   ADL Overall ADL's : Needs assistance/impaired                                       General ADL Comments: MAX A doff B socks at bed level. MOD A for bathing bed level - assist for BLE and buttocks. MIN A rolling L+R for pericare at bed level. Pt deferred mobility citing fatigue following recent PT session      Pertinent Vitals/Pain Pain Assessment: Faces Faces Pain Scale: Hurts little more Pain Location: groin with pericare, redness noted, RN applied powder Pain Descriptors / Indicators: Grimacing;Tender;Sore Pain Intervention(s): Limited activity within patient's tolerance;Repositioned     Hand Dominance Right   Extremity/Trunk Assessment Upper Extremity Assessment Upper Extremity Assessment: Generalized weakness   Lower Extremity Assessment Lower Extremity Assessment: Generalized weakness       Communication Communication Communication: HOH   Cognition Arousal/Alertness: Awake/alert Behavior During Therapy: Flat affect Overall Cognitive Status: Within Functional Limits for tasks assessed                                        Exercises Exercises: Other exercises Other Exercises Other Exercises: Pt educated re: OT role, DME recs, d/c recs, falls prevention, ECS, HEP Other Exercises: LBD, bathing, rolling, UBD   Shoulder  Instructions      Home Living Family/patient expects to be discharged to:: Skilled nursing facility Living Arrangements: Children Available Help at Discharge: Family;Available PRN/intermittently Type of Home: House Home Access: Stairs to enter Entrance Stairs-Number of Steps: 4+3   Home Layout: One level               Home Equipment: Walker - 2 wheels;Cane - single point;Bedside commode;Hospital bed;Wheelchair - manual   Additional Comments: Pt's grandson reports pt's manual w/c is from a friend and is not in good condition      Prior Functioning/Environment Level of Independence: Needs  assistance  Gait / Transfers Assistance Needed: Pt ambulatory prior to surgery in February 2022; but since that time pt only able to pivot to [facility] with assist from family (hospital bed to/from [facility]); has not pivoted to w/c at home yet ADL's / Homemaking Assistance Needed: States she has a PCA that helps 3x wk with bathing. States her dtr performs most IADLs. States her son runs errands/drives her.   Comments: PLOF per chart        OT Problem List: Decreased strength;Decreased range of motion;Decreased activity tolerance;Impaired balance (sitting and/or standing);Decreased knowledge of use of DME or AE      OT Treatment/Interventions: Self-care/ADL training;DME and/or AE instruction;Therapeutic activities;Balance training;Therapeutic exercise;Patient/family education    OT Goals(Current goals can be found in the care plan section) Acute Rehab OT Goals Patient Stated Goal: to just get better OT Goal Formulation: With patient Time For Goal Achievement: 02/21/21 Potential to Achieve Goals: Fair ADL Goals Pt Will Perform Upper Body Bathing: with set-up;with supervision;sitting (pt will tolerate >10 mins sitting) Pt Will Perform Upper Body Dressing: with modified independence;sitting Pt Will Transfer to Toilet: with mod assist;with +2 assist;squat pivot transfer;bedside commode (c LRAD PRN) Pt Will Perform Toileting - Clothing Manipulation and hygiene: with mod assist;sitting/lateral leans  OT Frequency: Min 1X/week    AM-PAC OT "6 Clicks" Daily Activity     Outcome Measure Help from another person eating meals?: None Help from another person taking care of personal grooming?: A Little Help from another person toileting, which includes using toliet, bedpan, or urinal?: A Lot Help from another person bathing (including washing, rinsing, drying)?: A Lot Help from another person to put on and taking off regular upper body clothing?: A Lot Help from another person to put on and taking off  regular lower body clothing?: A Lot 6 Click Score: 15   End of Session    Activity Tolerance: Patient tolerated treatment well Patient left: in bed;with call bell/phone within reach;with nursing/sitter in room  OT Visit Diagnosis: Muscle weakness (generalized) (M62.81)                Time: 5625-6389 OT Time Calculation (min): 19 min Charges:  OT General Charges $OT Visit: 1 Visit OT Evaluation $OT Re-eval: 1 Re-eval OT Treatments $Self Care/Home Management : 8-22 mins  Kathie Dike, M.S. OTR/L  02/07/21, 4:06 PM  ascom [phone]

## 2021-02-07 NOTE — Progress Notes (Signed)
Progress Note  Patient Name: Monica [name] Date of Encounter: 02/07/2021  Primary Cardiologist: Kirke Corin  Subjective   No chest pain, dyspnea, palpitations, or dizziness. She did have BM x 2 yesterday, though continues to feel constipated. Supplemental oxygen requirement remains at 6 L. Documented UOP 8.2 L for the admission. Renal function slightly up trending this morning.   Inpatient Medications    Scheduled Meds:  (feeding supplement) PROSource Plus  30 mL Oral TID BM   aspirin  81 mg Oral Daily   Chlorhexidine Gluconate Cloth  6 each Topical Daily   clopidogrel  75 mg Oral Daily   docusate sodium  100 mg Oral BID   doxycycline  100 mg Oral Q12H   enoxaparin (LOVENOX) injection  0.5 mg/kg Subcutaneous Q24H   feeding supplement  237 mL Oral TID BM   ipratropium-albuterol  3 mL Nebulization TID   lisinopril  2.5 mg Oral Daily   melatonin  5 mg Oral QHS   methylPREDNISolone (SOLU-MEDROL) injection  40 mg Intravenous Daily   metoprolol succinate  25 mg Oral Daily   multivitamin with minerals  1 tablet Oral Daily   pantoprazole (PROTONIX) IV  40 mg Intravenous Daily   pneumococcal 23 valent vaccine  0.5 mL Intramuscular Tomorrow-1000   polyethylene glycol  17 g Oral Daily   ranolazine  500 mg Oral BID   rosuvastatin  40 mg Oral Daily   sodium chloride flush  10-40 mL Intracatheter Q12H   Continuous Infusions:  sodium chloride 250 mL (01/29/21 1103)   promethazine (PHENERGAN) injection (IM or IVPB) Stopped (01/26/21 1902)   PRN Meds: acetaminophen, bisacodyl, diazepam, guaiFENesin-dextromethorphan, ipratropium-albuterol, lactulose, lip balm, [DISCONTINUED] metoCLOPramide **OR** metoCLOPramide (REGLAN) injection, morphine injection, nitroGLYCERIN, [DISCONTINUED] promethazine **OR** promethazine (PHENERGAN) injection (IM or IVPB) **OR** promethazine, sodium chloride, sodium chloride flush, traZODone   Vital Signs    Vitals:   02/06/21 1121 02/06/21 2029 02/06/21 2253  02/07/21 0500  BP: 112/69 (!) 100/57  (!) 101/59  Pulse:  85  85  Resp: 20 18  19   Temp: 97.7 F (36.5 C) 98 F (36.7 C)  97.7 F (36.5 C)  TempSrc: Axillary Oral  Oral  SpO2: 95% 93% 91% 96%  Weight:      Height:        Intake/Output Summary (Last 24 hours) at 02/07/2021 0740 Last data filed at 02/07/2021 0500 Gross per 24 hour  Intake 717 ml  Output 1000 ml  Net -283 ml    Filed Weights   01/30/21 0500 01/31/21 1100 02/01/21 0649  Weight: 98.5 kg 97.4 kg 99.1 kg    Telemetry    SR with PVCs - Personally Reviewed  ECG    No new tracings - Personally Reviewed  Physical Exam   GEN: No acute distress.   Neck: JVD unable to assess secondary to dressing. Cardiac: RRR, III/VI systolic murmur URSB, no rubs, or gallops.  Respiratory: Diminished and coarse breath sounds along the bases bilaterally.  GI: Soft, nontender, non-distended.   MS: No edema; No deformity. Neuro:  Alert and oriented x 3; Nonfocal.  Psych: Normal affect.  Labs    Chemistry Recent Labs  Lab 02/02/21 0605 02/03/21 0530 02/05/21 0732 02/06/21 0456 02/07/21 0448  NA 130*   < > 128* 127* 129*  K 3.2*   < > 3.9 3.7 3.7  CL 90*   < > 89* 86* 87*  CO2 30   < > 29 30 31   GLUCOSE 114*   < >  103* 108* 112*  BUN 28*   < > 28* 34* 40*  CREATININE 1.12*   < > 1.27* 1.27* 1.38*  CALCIUM 8.0*   < > 8.6* 8.5* 8.8*  PROT 6.7  --   --   --   --   ALBUMIN 2.7*  --  2.9*  --   --   AST 24  --   --   --   --   ALT 17  --   --   --   --   ALKPHOS 55  --   --   --   --   BILITOT 1.3*  --   --   --   --   GFRNONAA 48*   < > 41* 41* 38*  ANIONGAP 10   < > 10 11 11    < > = values in this interval not displayed.      Hematology Recent Labs  Lab 02/01/21 0630 02/06/21 0456  WBC 8.1 6.2  RBC 4.23 4.45  HGB 10.4* 11.0*  HCT 32.5* 33.4*  MCV 76.8* 75.1*  MCH 24.6* 24.7*  MCHC 32.0 32.9  RDW 21.7* 23.0*  PLT 272 301     Cardiac EnzymesNo results for input(s): TROPONINI in the last 168 hours.  No results for input(s): TROPIPOC in the last 168 hours.   BNP Recent Labs  Lab 01/31/21 1618  BNP 1,191.7*      DDimer No results for input(s): DDIMER in the last 168 hours.   Radiology    No results found.  Cardiac Studies   2D echo 01/29/2021: 1. Left ventricular ejection fraction, by estimation, is 30 to 35%. The  left ventricle has moderately decreased function. The left ventricle  demonstrates regional wall motion abnormalities (see scoring  diagram/findings for description). There is  moderate left ventricular hypertrophy. Left ventricular diastolic  parameters are indeterminate. There is severe hypokinesis of the left  ventricular, mid-apical anteroseptal wall, anterior wall, anterolateral  wall and apical segment.   2. Right ventricular systolic function is normal. The right ventricular  size is mildly enlarged. There is moderately elevated pulmonary artery  systolic pressure.   3. Left atrial size was mildly dilated.   4. The mitral valve is degenerative. Mild mitral valve regurgitation.  Mild mitral stenosis. Moderate mitral annular calcification.   5. The aortic valve is tricuspid. There is moderate calcification of the  aortic valve. There is severe thickening of the aortic valve. Aortic valve  regurgitation is trivial. Severe aortic valve stenosis. Aortic valve area,  by VTI measures 0.54 cm.  Aortic valve mean gradient measures 32.0 mmHg.   6. The inferior vena cava is normal in size with greater than 50%  respiratory variability, suggesting right atrial pressure of 3 mmHg.  Patient Profile     87 y.o. female with history of CAD with inferior STEMI in 05/2017 complicated by hypotension and bradycardia status post PCI/DES to the proximal RCA, severe aortic stenosis, GI bleed in 2018, chronic hypoxic respiratory failure, pulmonary fibrosis, prolonged tobacco use quitting after her MI, HLD, and obesity who was admitted 6/6 for postop hip infection and  respiratory failure and incidentally noted to have a large AAA now status post endovascular repair with postoperative course being complicated by NSTEMI and ongoing respiratory failure requiring significant supplemental oxygen.  Assessment & Plan    1. CAD involving the native coronary arteries with NSTEMI: -No further chest pain -High-sensitivity troponin elevated and flat trending -Status post 48 hours of IV heparin -  Continue metoprolol, lisinopril, Crestor, and ranolazine -DAPT with ASA and Plavix -After patient discussion with her primary cardiologist, the plan is to proceed with [facility] during this admission, possibly later this week (Dr. Kirke Corin is rounding again on 7/1), hopefully following continued improvement in her respiratory status    2.  HFrEF secondary to ICM: -EF down to 30 to 35% with suggestive of possible recent anterior MI -Appears euvolemic -Continue metoprolol and lisinopril -Torsemide held as below -Further GDMT has been limited in the setting of relative hypotension and especially in light of her severe AS -Plan for [facility] during admission, possibly 7/1 with Dr. Kirke Corin   3.  Acute on chronic hypoxic respiratory failure: -Supplemental oxygen requirement is improving  -Crackles on exam in the setting of fibrosis noted on chest CT this admission -Prior CTA chest was negative for PE this admission  -Wean steroids when able -Management per internal medicine   4.  AAA: -Status post endovascular repair -Currently on aspirin and Plavix -Management per vascular surgery   5.  Severe aortic stenosis: -Overall this is a difficult situation with the patient felt to likely being a poor TAVR candidate in the setting of numerous comorbidities -Mobility needs to improve prior -Initiate workup with R/LHC, possibly later this week   6.  Postoperative hip infection: -Improving -Followed by IM and infectious disease  7. AKI: -Hold torsemide for today -Follow BMP 7/30 in  preparation for cath 7/1   For questions or updates, please contact [facility] Please consult [email or web address] for contact info under Cardiology/STEMI.    Signed, Eula Listen, PA-C [facility] Pager: [phone] 02/07/2021, 7:40 AM

## 2021-02-08 DIAGNOSIS — I25118 Atherosclerotic heart disease of native coronary artery with other forms of angina pectoris: Secondary | ICD-10-CM

## 2021-02-08 LAB — BASIC METABOLIC PANEL
Anion gap: 11 (ref 5–15)
BUN: 39 mg/dL — ABNORMAL HIGH (ref 8–23)
CO2: 31 mmol/L (ref 22–32)
Calcium: 9 mg/dL (ref 8.9–10.3)
Chloride: 87 mmol/L — ABNORMAL LOW (ref 98–111)
Creatinine, Ser: 1.34 mg/dL — ABNORMAL HIGH (ref 0.44–1.00)
GFR, Estimated: 39 mL/min — ABNORMAL LOW (ref >60)
Glucose, Bld: 116 mg/dL — ABNORMAL HIGH (ref 70–99)
Potassium: 4.2 mmol/L (ref 3.5–5.1)
Sodium: 129 mmol/L — ABNORMAL LOW (ref 135–145)

## 2021-02-08 MED ORDER — SODIUM CHLORIDE 0.9% FLUSH
3.0000 mL | Freq: Two times a day (BID) | INTRAVENOUS | Status: DC
  Administered 2021-02-08 – 2021-02-10 (×2): 3 mL via INTRAVENOUS

## 2021-02-08 MED ORDER — IPRATROPIUM-ALBUTEROL 0.5-2.5 (3) MG/3ML IN SOLN
3.0000 mL | Freq: Two times a day (BID) | RESPIRATORY_TRACT | Status: DC
  Administered 2021-02-08 – 2021-02-10 (×3): 3 mL via RESPIRATORY_TRACT
  Filled 2021-02-08 (×4): qty 3

## 2021-02-08 NOTE — Progress Notes (Addendum)
Progress Note  Patient Name: Monica [name] Date of Encounter: 02/08/2021  Primary Cardiologist: Lorine Bears, MD  Subjective   She overall feels better, she does not have chest pain, shortness of breath, or palpitations. Currently, on 5L [place]. States she has not had a bowel movements in 3 days but feels abd pain is better controlled than before. She does experience some dizziness when being turned in the bed. Worked with therapy yesterday and was able to sit to the edge of the bed and stand up a couple of times.   Inpatient Medications    Scheduled Meds:  (feeding supplement) PROSource Plus  30 mL Oral TID BM   aspirin  81 mg Oral Daily   Chlorhexidine Gluconate Cloth  6 each Topical Daily   clopidogrel  75 mg Oral Daily   docusate sodium  100 mg Oral BID   doxycycline  100 mg Oral Q12H   enoxaparin (LOVENOX) injection  0.5 mg/kg Subcutaneous Q24H   feeding supplement  237 mL Oral TID BM   ipratropium-albuterol  3 mL Nebulization TID   lisinopril  2.5 mg Oral Daily   melatonin  5 mg Oral QHS   metoprolol succinate  25 mg Oral Daily   multivitamin with minerals  1 tablet Oral Daily   pantoprazole  40 mg Oral Daily   pneumococcal 23 valent vaccine  0.5 mL Intramuscular Tomorrow-1000   polyethylene glycol  17 g Oral Daily   predniSONE  20 mg Oral Q breakfast   ranolazine  500 mg Oral BID   rosuvastatin  40 mg Oral Daily   sodium chloride flush  10-40 mL Intracatheter Q12H   Continuous Infusions:  sodium chloride 250 mL (01/29/21 1103)   PRN Meds: acetaminophen, bisacodyl, guaiFENesin-dextromethorphan, HYDROmorphone (DILAUDID) injection, ipratropium-albuterol, lactulose, lip balm, nitroGLYCERIN, ondansetron (ZOFRAN) IV, sodium chloride, sodium chloride flush, traZODone   Vital Signs    Vitals:   02/07/21 2030 02/07/21 2050 02/08/21 0500 02/08/21 0830  BP: (!) 124/56  (!) 107/47 (!) 102/46  Pulse: 77  80 85  Resp: 14  17 18   Temp: 97.9 F (36.6 C)  98.1 F (36.7 C) 98  F (36.7 C)  TempSrc: Oral  Oral Oral  SpO2: 100% 100% (!) 65% 95%  Weight:      Height:        Intake/Output Summary (Last 24 hours) at 02/08/2021 0913 Last data filed at 02/08/2021 0600 Gross per 24 hour  Intake 360 ml  Output 1050 ml  Net -690 ml   Filed Weights   01/30/21 0500 01/31/21 1100 02/01/21 0649  Weight: 98.5 kg 97.4 kg 99.1 kg    Physical Exam   GEN: Well nourished, well developed, in no acute distress.  HEENT: Grossly normal.  Neck: Supple, no JVD, carotid bruits, or masses. Cardiac: RRR, 3/6 systolic murmur @ RUSB and heard throughout, no rubs, or gallops. No clubbing or cyanosis. Trace bilat LE edema.  Lower legs are very tender to light touch.  Radials 2+, DP 2+ and equal bilaterally.  Respiratory:  Respirations regular and unlabored, crackles ~ 1/2 up bilaterally. GI: Obese, soft, nontender, nondistended, BS + x 4. MS: no deformity or atrophy. Skin: warm and dry, no rash.  Neuro:  Strength and sensation are intact. Psych: AAOx3.  Normal affect.  Labs    Chemistry Recent Labs  Lab 02/02/21 0605 02/03/21 0530 02/05/21 0732 02/06/21 0456 02/07/21 0448  NA 130*   < > 128* 127* 129*  K 3.2*   < >  3.9 3.7 3.7  CL 90*   < > 89* 86* 87*  CO2 30   < > 29 30 31   GLUCOSE 114*   < > 103* 108* 112*  BUN 28*   < > 28* 34* 40*  CREATININE 1.12*   < > 1.27* 1.27* 1.38*  CALCIUM 8.0*   < > 8.6* 8.5* 8.8*  PROT 6.7  --   --   --   --   ALBUMIN 2.7*  --  2.9*  --   --   AST 24  --   --   --   --   ALT 17  --   --   --   --   ALKPHOS 55  --   --   --   --   BILITOT 1.3*  --   --   --   --   GFRNONAA 48*   < > 41* 41* 38*  ANIONGAP 10   < > 10 11 11    < > = values in this interval not displayed.     Hematology Recent Labs  Lab 02/06/21 0456  WBC 6.2  RBC 4.45  HGB 11.0*  HCT 33.4*  MCV 75.1*  MCH 24.7*  MCHC 32.9  RDW 23.0*  PLT 301    Cardiac Enzymes  Recent Labs  Lab 01/20/21 1336 01/20/21 1515 01/29/21 1054 01/29/21 1241  TROPONINIHS  11 12 1,374* 1,349*      Lipids  Lab Results  Component Value Date   CHOL 143 12/23/2017   HDL 58 12/23/2017   LDLCALC 66 12/23/2017   TRIG 95 12/23/2017   CHOLHDL 2.5 12/23/2017    HbA1c  Lab Results  Component Value Date   HGBA1C 5.7 (H) 06/02/2017    Telemetry    SR w/ PVCs, 70-80s bpm - Personally Reviewed  Cardiac Studies   2D Echocardiogram - 06.20.2022 1. Left ventricular ejection fraction, by estimation, is 30 to 35%. The left ventricle has moderately decreased function. The left ventricle demonstrates regional wall motion abnormalities (see scoring diagram/findings for description). There is moderate left ventricular hypertrophy. Left ventricular diastolic parameters are indeterminate. There is severe hypokinesis of the left ventricular, mid-apical anteroseptal wall, anterior wall, anterolateral wall and apical segment.   2. Right ventricular systolic function is normal. The right ventricular size is mildly enlarged. There is moderately elevated pulmonary artery systolic pressure.   3. Left atrial size was mildly dilated.   4. The mitral valve is degenerative. Mild mitral valve regurgitation.  Mild mitral stenosis. Moderate mitral annular calcification.   5. The aortic valve is tricuspid. There is moderate calcification of the aortic valve. There is severe thickening of the aortic valve. Aortic valve regurgitation is trivial. Severe aortic valve stenosis. Aortic valve area,  by VTI measures 0.54 cm.  Aortic valve mean gradient measures 32.0 mmHg.   6. The inferior vena cava is normal in size with greater than 50% respiratory variability, suggesting right atrial pressure of 3 mmHg.   2D Echocardiogram 02.22.2022  1. Left ventricular ejection fraction, by estimation, is 60 to 65%. The left ventricle has normal function. Left ventricular endocardial border not optimally defined to evaluate regional wall motion. There is mild left ventricular hypertrophy. Left   ventricular diastolic parameters are consistent with Grade I diastolic  dysfunction (impaired relaxation). Elevated left atrial pressure.   2. Right ventricular systolic function is normal. The right ventricular size is mildly enlarged. Tricuspid regurgitation signal is inadequate for assessing PA pressure.  3. Left atrial size was mildly dilated.   4. Right atrial size was mildly dilated.   5. The mitral valve is degenerative. Trivial mitral valve regurgitation.  Mild mitral stenosis. Moderate mitral annular calcification.   6. Tricuspid valve regurgitation at least moderate but suboptimally visualized.   7. The aortic valve is tricuspid. There is moderate calcification of the aortic valve. There is severe thickening of the aortic valve. Aortic valve regurgitation is not visualized. Severe aortic valve stenosis. Aortic valve area, by VTI measures 0.51 cm. Aortic valve mean gradient measures 60.0 mmHg. Aortic valve Vmax  measures 4.83 m/s.   8. The inferior vena cava is normal in size with greater than 50% respiratory variability, suggesting right atrial pressure of 3 mmHg.   Patient Profile     87 y.o. female with history of CAD with interior STEMI in 05/2017 complicated by hypotension and bradycardia s/p PCI/DES to the proximal RCA, severe aortic stenosis, GI bleed in 2018, prolonged tobacco use quitting after MI, HLD, and obesity, who was admitted on 6/6 secondary to hip infection and was incidentally noted to have a large AAA, now s/p repair. Post-op course complicated by ongoing resp failure req supplemental oxygen, and NSTEMI w/ new LV dysfxn.  Assessment & Plan    1. NSTEMI, history of CAD/PCI: pt w/ h/o CAD s/p RCA stenting in 2018. Prolonged hospital course this admission 2/2 hip infxn, AAA s/p EVAR, and resp failure. She developed chest pain on the AM of 6/20, and was subsequently found to have elevated HsTrop (1374  1349).  Echo w/ new LV dysfxn and EF of 30-35% w/ sev mid-apical  anteroseptal, anterior, anterolateral, and apical HK.  S/p heparin drip x48 hours last week. Patient is no longer experiencing chest pain. Continue current meds including metoprolol, rosuvastatin, asa, plavix, and ranolazine.  As prev discussed, plan R/LHC with Dr. Kirke Corin on 7/1 (BMET pending this AM). The patient understands that risks include but are not limited to stroke (1 in 1000), death (1 in 1000), kidney failure [usually temporary] (1 in 500), bleeding (1 in 200), allergic reaction [possibly serious] (1 in 200), and agrees to proceed.    2. HFrEF/Presumed ICM: EF down to 30-35% with possible recent anterior MI as outlined above. Has been on ? blocker and acie.  Will hold acei in AM in prep for cath w/ AKI/CKDIII.  Diuretic on hold and relatively euvolemic on exam.  BMET pending this AM.   - Strict I&Os.  - Daily Weights.  3. Acute on chronic respiratory failure: Patient's supplemental oxygen requirement is improving. Persistent bilat crackles in setting of pulm fibrosis noted on CT chest 01/20/21. Wean steroids when possible. Management per IM.    4. AAA s/p EVAR: Per vasc surgery.  5. Severe AS: Plan for R & L heart cath tomorrow as first step in TAVR w/u.  Pending results, will determine appropriateness of outpt imaging and structural heart referral.  6. Post-operative hip infection: Continue Doxycycline antibiotic per IM.   7.Acute on CKD III: Creat 1.38 on 6/29.  Torsemide on hold.  BMET pending this AM.  Will hold acei in AM.  Signed, Nicolasa Ducking, NP  02/08/2021, 9:13 AM    For questions or updates, please contact   Please consult [email or web address] for contact info under Cardiology/STEMI.

## 2021-02-08 NOTE — Plan of Care (Signed)

## 2021-02-08 NOTE — Progress Notes (Signed)
[facility]  -  at [facility]   PATIENT NAME: Monica [name]    MR#:  [id number]  DATE OF BIRTH:  [birth year]  SUBJECTIVE:  overall improving. Has some chronic pain issues mainly in the abdomen. Had bowel movement. Patient was constipated. Agreeable with cardiac cath later this week. No family in the room.  REVIEW OF SYSTEMS:   Review of Systems  Constitutional:  Negative for chills, fever and weight loss.  HENT:  Negative for ear discharge, ear pain and nosebleeds.   Eyes:  Negative for blurred vision, pain and discharge.  Respiratory:  Negative for sputum production, shortness of breath, wheezing and stridor.   Cardiovascular:  Negative for chest pain, palpitations, orthopnea and PND.  Gastrointestinal:  Positive for abdominal pain and constipation. Negative for diarrhea, nausea and vomiting.  Genitourinary:  Negative for frequency and urgency.  Musculoskeletal:  Positive for joint pain. Negative for back pain.  Neurological:  Positive for weakness. Negative for sensory change, speech change and focal weakness.  Psychiatric/Behavioral:  Negative for depression and hallucinations. The patient is not nervous/anxious.   Tolerating Diet: Tolerating PT:   DRUG ALLERGIES:  No Known Allergies  VITALS:  Blood pressure (!) 102/46, pulse 85, temperature 97.8 F (36.6 C), temperature source Oral, resp. rate 15, height 5\' 5"  (1.651 m), weight 99.1 kg, SpO2 95 %.  PHYSICAL EXAMINATION:   Physical Exam  GENERAL:  87 y.o.-year-old patient lying in the bed with no acute distress.  Chronically ill LUNGS: decreased breath sounds bilaterally, no wheezing, rales, rhonchi. No use of accessory muscles of respiration.  CARDIOVASCULAR: S1, S2 normal. No murmurs, rubs, or gallops.  ABDOMEN: Soft, nontender, nondistended. Bowel sounds present. No organomegaly or mass.  EXTREMITIES: no edema b/l.    NEUROLOGIC: nonfocal, generalized weakness PSYCHIATRIC:  patient is alert and  oriented x 3.  SKIN: No obvious rash, lesion, or ulcer.   LABORATORY PANEL:  CBC Recent Labs  Lab 02/06/21 0456  WBC 6.2  HGB 11.0*  HCT 33.4*  PLT 301     Chemistries  Recent Labs  Lab 02/02/21 0605 02/03/21 0530 02/08/21 1127  NA 130*   < > 129*  K 3.2*   < > 4.2  CL 90*   < > 87*  CO2 30   < > 31  GLUCOSE 114*   < > 116*  BUN 28*   < > 39*  CREATININE 1.12*   < > 1.34*  CALCIUM 8.0*   < > 9.0  AST 24  --   --   ALT 17  --   --   ALKPHOS 55  --   --   BILITOT 1.3*  --   --    < > = values in this interval not displayed.    Cardiac Enzymes No results for input(s): TROPONINI in the last 168 hours. RADIOLOGY:  No results found. ASSESSMENT AND PLAN:  87 y.o. female with history of CAD with inferior STEMI in 05/2017 complicated by hypotension and bradycardia status post PCI/DES to the proximal RCA, severe aortic stenosis, GI bleed in 2018, chronic hypoxic respiratory failure, pulmonary fibrosis, prolonged tobacco use quitting after her MI, HLD, and obesity who was admitted 6/6 for postop hip infection and respiratory failure and incidentally noted to have a large AAA now status post endovascular repair with postoperative course being complicated by NSTEMI and ongoing respiratory failure requiring significant supplemental oxygen.  Non-STEMI with markedly elevated troponin -- patient followed by [facility] cardiology. -- recommendations  for right and left heart catheterization to evaluate aortic valve on Friday -- continue aspirin, Plavix, lisinopril and beta-blockers, Crestor and ranexa.  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 Allena Katz on June 7 -- per infectious disease continue PO doxycycline-- stop date 02/15/21 -- finished 17 days of IV vancomycin and completed rifampin  constipation/lower abdominal pain -- patient had BM  AAA 8.2 cm -- status post endovascular repair  Left renal nodule Incidental finding, minor, given gravity of AAA and AS and hypoxia, this likely has not been discussed with patient and should be followed up only depending on goals of care after AAA repair - Follow-up MRI as an outpatient  Class II obesity. Estimated body mass index is 36.36 kg/m as calculated from the following:   Height as of this encounter: 5\' 5"  (1.651 m).   Weight as of this encounter: 99.1 kg.    Consults : cardiology, ID, palliative care CODE STATUS: DNR DNI DVT Prophylaxis : Lovenox Level of care: Progressive Cardiac Status is: Inpatient  Remains inpatient appropriate because:Inpatient level of care appropriate due to severity of illness  Dispo: The patient is from: Home              Anticipated d/c is to: SNF              Patient currently is not medically stable to d/c.   Difficult to place patient No   TOC for discharge planning. PT OT recommends rehab. Will get cardiac cath on Friday and likely discharge early next week depending on clinical stability     TOTAL TIME TAKING CARE OF THIS PATIENT: 25 minutes.  >50% time spent on counselling and coordination of care  Note: This dictation was prepared with Dragon dictation along with smaller phrase technology. Any transcriptional errors that result from this process are unintentional.  Tuesday M.D    [facility]   CC: Primary care physician; Patient, No Pcp Per (Inactive) Patient ID: Monica [name], female    DOB: [birth year], 87 y.o.   MRN: [id number]

## 2021-02-08 NOTE — H&P (View-Only) (Signed)
Progress Note  Patient Name: Monica [name] Date of Encounter: 02/08/2021  Primary Cardiologist: Lorine Bears, MD  Subjective   She overall feels better, she does not have chest pain, shortness of breath, or palpitations. Currently, on 5L [place]. States she has not had a bowel movements in 3 days but feels abd pain is better controlled than before. She does experience some dizziness when being turned in the bed. Worked with therapy yesterday and was able to sit to the edge of the bed and stand up a couple of times.   Inpatient Medications    Scheduled Meds:  (feeding supplement) PROSource Plus  30 mL Oral TID BM   aspirin  81 mg Oral Daily   Chlorhexidine Gluconate Cloth  6 each Topical Daily   clopidogrel  75 mg Oral Daily   docusate sodium  100 mg Oral BID   doxycycline  100 mg Oral Q12H   enoxaparin (LOVENOX) injection  0.5 mg/kg Subcutaneous Q24H   feeding supplement  237 mL Oral TID BM   ipratropium-albuterol  3 mL Nebulization TID   lisinopril  2.5 mg Oral Daily   melatonin  5 mg Oral QHS   metoprolol succinate  25 mg Oral Daily   multivitamin with minerals  1 tablet Oral Daily   pantoprazole  40 mg Oral Daily   pneumococcal 23 valent vaccine  0.5 mL Intramuscular Tomorrow-1000   polyethylene glycol  17 g Oral Daily   predniSONE  20 mg Oral Q breakfast   ranolazine  500 mg Oral BID   rosuvastatin  40 mg Oral Daily   sodium chloride flush  10-40 mL Intracatheter Q12H   Continuous Infusions:  sodium chloride 250 mL (01/29/21 1103)   PRN Meds: acetaminophen, bisacodyl, guaiFENesin-dextromethorphan, HYDROmorphone (DILAUDID) injection, ipratropium-albuterol, lactulose, lip balm, nitroGLYCERIN, ondansetron (ZOFRAN) IV, sodium chloride, sodium chloride flush, traZODone   Vital Signs    Vitals:   02/07/21 2030 02/07/21 2050 02/08/21 0500 02/08/21 0830  BP: (!) 124/56  (!) 107/47 (!) 102/46  Pulse: 77  80 85  Resp: 14  17 18   Temp: 97.9 F (36.6 C)  98.1 F (36.7 C) 98  F (36.7 C)  TempSrc: Oral  Oral Oral  SpO2: 100% 100% (!) 65% 95%  Weight:      Height:        Intake/Output Summary (Last 24 hours) at 02/08/2021 0913 Last data filed at 02/08/2021 0600 Gross per 24 hour  Intake 360 ml  Output 1050 ml  Net -690 ml   Filed Weights   01/30/21 0500 01/31/21 1100 02/01/21 0649  Weight: 98.5 kg 97.4 kg 99.1 kg    Physical Exam   GEN: Well nourished, well developed, in no acute distress.  HEENT: Grossly normal.  Neck: Supple, no JVD, carotid bruits, or masses. Cardiac: RRR, 3/6 systolic murmur @ RUSB and heard throughout, no rubs, or gallops. No clubbing or cyanosis. Trace bilat LE edema.  Lower legs are very tender to light touch.  Radials 2+, DP 2+ and equal bilaterally.  Respiratory:  Respirations regular and unlabored, crackles ~ 1/2 up bilaterally. GI: Obese, soft, nontender, nondistended, BS + x 4. MS: no deformity or atrophy. Skin: warm and dry, no rash.  Neuro:  Strength and sensation are intact. Psych: AAOx3.  Normal affect.  Labs    Chemistry Recent Labs  Lab 02/02/21 0605 02/03/21 0530 02/05/21 0732 02/06/21 0456 02/07/21 0448  NA 130*   < > 128* 127* 129*  K 3.2*   < >  3.9 3.7 3.7  CL 90*   < > 89* 86* 87*  CO2 30   < > 29 30 31   GLUCOSE 114*   < > 103* 108* 112*  BUN 28*   < > 28* 34* 40*  CREATININE 1.12*   < > 1.27* 1.27* 1.38*  CALCIUM 8.0*   < > 8.6* 8.5* 8.8*  PROT 6.7  --   --   --   --   ALBUMIN 2.7*  --  2.9*  --   --   AST 24  --   --   --   --   ALT 17  --   --   --   --   ALKPHOS 55  --   --   --   --   BILITOT 1.3*  --   --   --   --   GFRNONAA 48*   < > 41* 41* 38*  ANIONGAP 10   < > 10 11 11    < > = values in this interval not displayed.     Hematology Recent Labs  Lab 02/06/21 0456  WBC 6.2  RBC 4.45  HGB 11.0*  HCT 33.4*  MCV 75.1*  MCH 24.7*  MCHC 32.9  RDW 23.0*  PLT 301    Cardiac Enzymes  Recent Labs  Lab 01/20/21 1336 01/20/21 1515 01/29/21 1054 01/29/21 1241  TROPONINIHS  11 12 1,374* 1,349*      Lipids  Lab Results  Component Value Date   CHOL 143 12/23/2017   HDL 58 12/23/2017   LDLCALC 66 12/23/2017   TRIG 95 12/23/2017   CHOLHDL 2.5 12/23/2017    HbA1c  Lab Results  Component Value Date   HGBA1C 5.7 (H) 06/02/2017    Telemetry    SR w/ PVCs, 70-80s bpm - Personally Reviewed  Cardiac Studies   2D Echocardiogram - 06.20.2022 1. Left ventricular ejection fraction, by estimation, is 30 to 35%. The left ventricle has moderately decreased function. The left ventricle demonstrates regional wall motion abnormalities (see scoring diagram/findings for description). There is moderate left ventricular hypertrophy. Left ventricular diastolic parameters are indeterminate. There is severe hypokinesis of the left ventricular, mid-apical anteroseptal wall, anterior wall, anterolateral wall and apical segment.   2. Right ventricular systolic function is normal. The right ventricular size is mildly enlarged. There is moderately elevated pulmonary artery systolic pressure.   3. Left atrial size was mildly dilated.   4. The mitral valve is degenerative. Mild mitral valve regurgitation.  Mild mitral stenosis. Moderate mitral annular calcification.   5. The aortic valve is tricuspid. There is moderate calcification of the aortic valve. There is severe thickening of the aortic valve. Aortic valve regurgitation is trivial. Severe aortic valve stenosis. Aortic valve area,  by VTI measures 0.54 cm.  Aortic valve mean gradient measures 32.0 mmHg.   6. The inferior vena cava is normal in size with greater than 50% respiratory variability, suggesting right atrial pressure of 3 mmHg.   2D Echocardiogram 02.22.2022  1. Left ventricular ejection fraction, by estimation, is 60 to 65%. The left ventricle has normal function. Left ventricular endocardial border not optimally defined to evaluate regional wall motion. There is mild left ventricular hypertrophy. Left   ventricular diastolic parameters are consistent with Grade I diastolic  dysfunction (impaired relaxation). Elevated left atrial pressure.   2. Right ventricular systolic function is normal. The right ventricular size is mildly enlarged. Tricuspid regurgitation signal is inadequate for assessing PA pressure.  3. Left atrial size was mildly dilated.   4. Right atrial size was mildly dilated.   5. The mitral valve is degenerative. Trivial mitral valve regurgitation.  Mild mitral stenosis. Moderate mitral annular calcification.   6. Tricuspid valve regurgitation at least moderate but suboptimally visualized.   7. The aortic valve is tricuspid. There is moderate calcification of the aortic valve. There is severe thickening of the aortic valve. Aortic valve regurgitation is not visualized. Severe aortic valve stenosis. Aortic valve area, by VTI measures 0.51 cm. Aortic valve mean gradient measures 60.0 mmHg. Aortic valve Vmax  measures 4.83 m/s.   8. The inferior vena cava is normal in size with greater than 50% respiratory variability, suggesting right atrial pressure of 3 mmHg.   Patient Profile     87 y.o. female with history of CAD with interior STEMI in 05/2017 complicated by hypotension and bradycardia s/p PCI/DES to the proximal RCA, severe aortic stenosis, GI bleed in 2018, prolonged tobacco use quitting after MI, HLD, and obesity, who was admitted on 6/6 secondary to hip infection and was incidentally noted to have a large AAA, now s/p repair. Post-op course complicated by ongoing resp failure req supplemental oxygen, and NSTEMI w/ new LV dysfxn.  Assessment & Plan    1. NSTEMI, history of CAD/PCI: pt w/ h/o CAD s/p RCA stenting in 2018. Prolonged hospital course this admission 2/2 hip infxn, AAA s/p EVAR, and resp failure. She developed chest pain on the AM of 6/20, and was subsequently found to have elevated HsTrop (1374  1349).  Echo w/ new LV dysfxn and EF of 30-35% w/ sev mid-apical  anteroseptal, anterior, anterolateral, and apical HK.  S/p heparin drip x48 hours last week. Patient is no longer experiencing chest pain. Continue current meds including metoprolol, rosuvastatin, asa, plavix, and ranolazine.  As prev discussed, plan R/LHC with Dr. Kirke Corin on 7/1 (BMET pending this AM). The patient understands that risks include but are not limited to stroke (1 in 1000), death (1 in 1000), kidney failure [usually temporary] (1 in 500), bleeding (1 in 200), allergic reaction [possibly serious] (1 in 200), and agrees to proceed.    2. HFrEF/Presumed ICM: EF down to 30-35% with possible recent anterior MI as outlined above. Has been on ? blocker and acie.  Will hold acei in AM in prep for cath w/ AKI/CKDIII.  Diuretic on hold and relatively euvolemic on exam.  BMET pending this AM.   - Strict I&Os.  - Daily Weights.  3. Acute on chronic respiratory failure: Patient's supplemental oxygen requirement is improving. Persistent bilat crackles in setting of pulm fibrosis noted on CT chest 01/20/21. Wean steroids when possible. Management per IM.    4. AAA s/p EVAR: Per vasc surgery.  5. Severe AS: Plan for R & L heart cath tomorrow as first step in TAVR w/u.  Pending results, will determine appropriateness of outpt imaging and structural heart referral.  6. Post-operative hip infection: Continue Doxycycline antibiotic per IM.   7.Acute on CKD III: Creat 1.38 on 6/29.  Torsemide on hold.  BMET pending this AM.  Will hold acei in AM.  Signed, Nicolasa Ducking, NP  02/08/2021, 9:13 AM    For questions or updates, please contact   Please consult [email or web address] for contact info under Cardiology/STEMI.

## 2021-02-09 ENCOUNTER — Encounter: Payer: Self-pay | Admitting: Cardiovascular Disease

## 2021-02-09 ENCOUNTER — Encounter
Admission: EM | Disposition: A | Discharge status: Skilled Nursing Facility | Payer: Self-pay | Source: Home / Self Care | Attending: Internal Medicine

## 2021-02-09 DIAGNOSIS — I5021 Acute systolic (congestive) heart failure: Secondary | ICD-10-CM

## 2021-02-09 HISTORY — PX: RIGHT/LEFT HEART CATH AND CORONARY ANGIOGRAPHY: CATH118266

## 2021-02-09 LAB — RESP PANEL BY RT-PCR (FLU A&B, COVID) ARPGX2
Influenza A by PCR: NEGATIVE
Influenza B by PCR: NEGATIVE
SARS Coronavirus 2 by RT PCR: NEGATIVE

## 2021-02-09 LAB — HEPATIC FUNCTION PANEL
ALT: 19 U/L (ref 0–44)
AST: 35 U/L (ref 15–41)
Albumin: 3 g/dL — ABNORMAL LOW (ref 3.5–5.0)
Alkaline Phosphatase: 89 U/L (ref 38–126)
Bilirubin, Direct: 0.3 mg/dL — ABNORMAL HIGH (ref 0.0–0.2)
Indirect Bilirubin: 0.8 mg/dL (ref 0.3–0.9)
Total Bilirubin: 1.1 mg/dL (ref 0.3–1.2)
Total Protein: 6.8 g/dL (ref 6.5–8.1)

## 2021-02-09 LAB — BASIC METABOLIC PANEL
Anion gap: 9 (ref 5–15)
BUN: 36 mg/dL — ABNORMAL HIGH (ref 8–23)
CO2: 27 mmol/L (ref 22–32)
Calcium: 8.7 mg/dL — ABNORMAL LOW (ref 8.9–10.3)
Chloride: 92 mmol/L — ABNORMAL LOW (ref 98–111)
Creatinine, Ser: 1.18 mg/dL — ABNORMAL HIGH (ref 0.44–1.00)
GFR, Estimated: 45 mL/min — ABNORMAL LOW (ref >60)
Glucose, Bld: 109 mg/dL — ABNORMAL HIGH (ref 70–99)
Potassium: 4.5 mmol/L (ref 3.5–5.1)
Sodium: 128 mmol/L — ABNORMAL LOW (ref 135–145)

## 2021-02-09 SURGERY — RIGHT/LEFT HEART CATH AND CORONARY ANGIOGRAPHY
Anesthesia: Moderate Sedation

## 2021-02-09 MED ORDER — MIDAZOLAM HCL 2 MG/2ML IJ SOLN
INTRAMUSCULAR | Status: AC
  Filled 2021-02-09: qty 2

## 2021-02-09 MED ORDER — HEPARIN SODIUM (PORCINE) 1000 UNIT/ML IJ SOLN
INTRAMUSCULAR | Status: DC | PRN
  Administered 2021-02-09: 3000 [IU] via INTRAVENOUS

## 2021-02-09 MED ORDER — ASPIRIN 81 MG PO CHEW: 81.0000 mg | CHEWABLE_TABLET | ORAL | Status: DC

## 2021-02-09 MED ORDER — SODIUM CHLORIDE 0.9% FLUSH: 3.0000 mL | INTRAVENOUS | Status: DC | PRN

## 2021-02-09 MED ORDER — LIDOCAINE HCL (PF) 1 % IJ SOLN
INTRAMUSCULAR | Status: AC
  Filled 2021-02-09: qty 30

## 2021-02-09 MED ORDER — VERAPAMIL HCL 2.5 MG/ML IV SOLN
INTRAVENOUS | Status: AC
  Filled 2021-02-09: qty 2

## 2021-02-09 MED ORDER — SODIUM CHLORIDE 0.9 % IV SOLN: INTRAVENOUS | Status: DC

## 2021-02-09 MED ORDER — FENTANYL CITRATE (PF) 100 MCG/2ML IJ SOLN
INTRAMUSCULAR | Status: AC
  Filled 2021-02-09: qty 2

## 2021-02-09 MED ORDER — SODIUM CHLORIDE 0.9% FLUSH
3.0000 mL | Freq: Two times a day (BID) | INTRAVENOUS | Status: DC
  Administered 2021-02-09 (×2): 3 mL via INTRAVENOUS

## 2021-02-09 MED ORDER — MIDAZOLAM HCL 2 MG/2ML IJ SOLN
INTRAMUSCULAR | Status: DC | PRN
  Administered 2021-02-09: 0.5 mg via INTRAVENOUS

## 2021-02-09 MED ORDER — VERAPAMIL HCL 2.5 MG/ML IV SOLN
INTRAVENOUS | Status: DC | PRN
  Administered 2021-02-09: 2.5 mg via INTRAVENOUS

## 2021-02-09 MED ORDER — SODIUM CHLORIDE 0.9 % IV SOLN: 250.0000 mL | INTRAVENOUS | Status: DC | PRN

## 2021-02-09 MED ORDER — HEPARIN (PORCINE) IN NACL 1000-0.9 UT/500ML-% IV SOLN
INTRAVENOUS | Status: AC
  Filled 2021-02-09: qty 1000

## 2021-02-09 MED ORDER — HEPARIN (PORCINE) IN NACL 2000-0.9 UNIT/L-% IV SOLN
INTRAVENOUS | Status: DC | PRN
  Administered 2021-02-09: 1000 mL

## 2021-02-09 MED ORDER — HEPARIN SODIUM (PORCINE) 1000 UNIT/ML IJ SOLN
INTRAMUSCULAR | Status: AC
  Filled 2021-02-09: qty 1

## 2021-02-09 MED ORDER — IOHEXOL 300 MG/ML  SOLN
INTRAMUSCULAR | Status: DC | PRN
  Administered 2021-02-09: 38 mL

## 2021-02-09 MED ORDER — FENTANYL CITRATE (PF) 100 MCG/2ML IJ SOLN
INTRAMUSCULAR | Status: DC | PRN
  Administered 2021-02-09: 12.5 ug via INTRAVENOUS
  Administered 2021-02-09: 25 ug via INTRAVENOUS

## 2021-02-09 MED ORDER — LIDOCAINE HCL (PF) 1 % IJ SOLN
INTRAMUSCULAR | Status: DC | PRN
  Administered 2021-02-09: 5 mL

## 2021-02-09 MED ORDER — ASPIRIN 81 MG PO CHEW
81.0000 mg | CHEWABLE_TABLET | ORAL | Status: AC
  Administered 2021-02-09: 81 mg via ORAL
  Filled 2021-02-09: qty 1

## 2021-02-09 SURGICAL SUPPLY — 13 items
CATH BALLN WEDGE 5F 110CM (CATHETERS) ×3 IMPLANT
CATH INFINITI 5FR JK (CATHETERS) ×3 IMPLANT
DEVICE RAD TR BAND REGULAR (VASCULAR PRODUCTS) ×3 IMPLANT
DRAPE BRACHIAL (DRAPES) ×6 IMPLANT
GLIDESHEATH SLEND SS 6F .021 (SHEATH) ×3 IMPLANT
GUIDEWIRE INQWIRE 1.5J.035X260 (WIRE) ×1 IMPLANT
INQWIRE 1.5J .035X260CM (WIRE) ×3
PACK CARDIAC CATH (CUSTOM PROCEDURE TRAY) ×3 IMPLANT
PROTECTION STATION PRESSURIZED (MISCELLANEOUS) ×3
SET ATX SIMPLICITY (MISCELLANEOUS) ×3 IMPLANT
SHEATH GLIDE SLENDER 4/5FR (SHEATH) ×3 IMPLANT
STATION PROTECTION PRESSURIZED (MISCELLANEOUS) ×1 IMPLANT
WIRE HITORQ VERSACORE ST 145CM (WIRE) ×3 IMPLANT

## 2021-02-09 NOTE — Progress Notes (Signed)
Progress Note  Patient Name: Monica [name] Date of Encounter: 02/09/2021  Primary Cardiologist: Lorine Bears, MD  Subjective   She feels better overall with less shortness of breath.  She is also less hypoxic and now requiring only 3 L nasal cannula. Right and left cardiac catheterization was done today via the right arm.  Catheterization showed patent RCA stent with stable moderate disease in the mid RCA and left circumflex.  No obstructive disease.  Right heart catheterization showed normal filling pressures, mild pulmonary hypertension and mild reduced cardiac output.   Inpatient Medications    Scheduled Meds:  (feeding supplement) PROSource Plus  30 mL Oral TID BM   aspirin  81 mg Oral Daily   Chlorhexidine Gluconate Cloth  6 each Topical Daily   clopidogrel  75 mg Oral Daily   docusate sodium  100 mg Oral BID   doxycycline  100 mg Oral Q12H   enoxaparin (LOVENOX) injection  0.5 mg/kg Subcutaneous Q24H   feeding supplement  237 mL Oral TID BM   ipratropium-albuterol  3 mL Nebulization BID   melatonin  5 mg Oral QHS   metoprolol succinate  25 mg Oral Daily   multivitamin with minerals  1 tablet Oral Daily   pantoprazole  40 mg Oral Daily   pneumococcal 23 valent vaccine  0.5 mL Intramuscular Tomorrow-1000   polyethylene glycol  17 g Oral Daily   predniSONE  20 mg Oral Q breakfast   ranolazine  500 mg Oral BID   rosuvastatin  40 mg Oral Daily   sodium chloride flush  10-40 mL Intracatheter Q12H   sodium chloride flush  3 mL Intravenous Q12H   sodium chloride flush  3 mL Intravenous Q12H   Continuous Infusions:  sodium chloride 250 mL (01/29/21 1103)   sodium chloride     PRN Meds: sodium chloride, acetaminophen, bisacodyl, guaiFENesin-dextromethorphan, HYDROmorphone (DILAUDID) injection, ipratropium-albuterol, lactulose, lip balm, nitroGLYCERIN, ondansetron (ZOFRAN) IV, sodium chloride, sodium chloride flush, sodium chloride flush, traZODone   Vital Signs     Vitals:   02/09/21 0915 02/09/21 1000 02/09/21 1015 02/09/21 1030  BP: 103/74 114/79 (!) 114/99 (!) 110/52  Pulse: 81 79 79 78  Resp: 20 16 16 16   Temp:      TempSrc:      SpO2: 97% 94% 96% 91%  Weight:      Height:        Intake/Output Summary (Last 24 hours) at 02/09/2021 1236 Last data filed at 02/09/2021 1203 Gross per 24 hour  Intake 730.25 ml  Output 900 ml  Net -169.75 ml    Filed Weights   02/01/21 0649 02/09/21 0228 02/09/21 0720  Weight: 99.1 kg 93.7 kg 93.7 kg    Physical Exam   GEN: Well nourished, well developed, in no acute distress.  HEENT: Grossly normal.  Neck: Supple, no JVD, carotid bruits, or masses. Cardiac: RRR, 3/6 systolic murmur @ RUSB and heard throughout, no rubs, or gallops. No clubbing or cyanosis. Trace bilat LE edema.  Lower legs are very tender to light touch.  Radials 2+, DP 2+ and equal bilaterally.  Respiratory:  Respirations regular and unlabored, crackles ~ 1/2 up bilaterally. GI: Obese, soft, nontender, nondistended, BS + x 4. MS: no deformity or atrophy. Skin: warm and dry, no rash.  Neuro:  Strength and sensation are intact. Psych: AAOx3.  Normal affect.  Labs    Chemistry Recent Labs  Lab 02/05/21 0732 02/06/21 0456 02/07/21 0448 02/08/21 1127 02/09/21 0448  NA 128*   < >  129* 129* 128*  K 3.9   < > 3.7 4.2 4.5  CL 89*   < > 87* 87* 92*  CO2 29   < > 31 31 27   GLUCOSE 103*   < > 112* 116* 109*  BUN 28*   < > 40* 39* 36*  CREATININE 1.27*   < > 1.38* 1.34* 1.18*  CALCIUM 8.6*   < > 8.8* 9.0 8.7*  PROT  --   --   --   --  6.8  ALBUMIN 2.9*  --   --   --  3.0*  AST  --   --   --   --  35  ALT  --   --   --   --  19  ALKPHOS  --   --   --   --  89  BILITOT  --   --   --   --  1.1  GFRNONAA 41*   < > 38* 39* 45*  ANIONGAP 10   < > 11 11 9    < > = values in this interval not displayed.      Hematology Recent Labs  Lab 02/06/21 0456  WBC 6.2  RBC 4.45  HGB 11.0*  HCT 33.4*  MCV 75.1*  MCH 24.7*  MCHC 32.9   RDW 23.0*  PLT 301     Cardiac Enzymes  Recent Labs  Lab 01/20/21 1336 01/20/21 1515 01/29/21 1054 01/29/21 1241  TROPONINIHS 11 12 1,374* 1,349*       Lipids  Lab Results  Component Value Date   CHOL 143 12/23/2017   HDL 58 12/23/2017   LDLCALC 66 12/23/2017   TRIG 95 12/23/2017   CHOLHDL 2.5 12/23/2017    HbA1c  Lab Results  Component Value Date   HGBA1C 5.7 (H) 06/02/2017    Telemetry    SR w/ PVCs, 70-80s bpm - Personally Reviewed  Cardiac Studies   2D Echocardiogram - 06.20.2022 1. Left ventricular ejection fraction, by estimation, is 30 to 35%. The left ventricle has moderately decreased function. The left ventricle demonstrates regional wall motion abnormalities (see scoring diagram/findings for description). There is moderate left ventricular hypertrophy. Left ventricular diastolic parameters are indeterminate. There is severe hypokinesis of the left ventricular, mid-apical anteroseptal wall, anterior wall, anterolateral wall and apical segment.   2. Right ventricular systolic function is normal. The right ventricular size is mildly enlarged. There is moderately elevated pulmonary artery systolic pressure.   3. Left atrial size was mildly dilated.   4. The mitral valve is degenerative. Mild mitral valve regurgitation.  Mild mitral stenosis. Moderate mitral annular calcification.   5. The aortic valve is tricuspid. There is moderate calcification of the aortic valve. There is severe thickening of the aortic valve. Aortic valve regurgitation is trivial. Severe aortic valve stenosis. Aortic valve area,  by VTI measures 0.54 cm.  Aortic valve mean gradient measures 32.0 mmHg.   6. The inferior vena cava is normal in size with greater than 50% respiratory variability, suggesting right atrial pressure of 3 mmHg.   2D Echocardiogram 02.22.2022  1. Left ventricular ejection fraction, by estimation, is 60 to 65%. The left ventricle has normal function. Left  ventricular endocardial border not optimally defined to evaluate regional wall motion. There is mild left ventricular hypertrophy. Left  ventricular diastolic parameters are consistent with Grade I diastolic  dysfunction (impaired relaxation). Elevated left atrial pressure.   2. Right ventricular systolic function is normal. The right ventricular size is  mildly enlarged. Tricuspid regurgitation signal is inadequate for assessing PA pressure.   3. Left atrial size was mildly dilated.   4. Right atrial size was mildly dilated.   5. The mitral valve is degenerative. Trivial mitral valve regurgitation.  Mild mitral stenosis. Moderate mitral annular calcification.   6. Tricuspid valve regurgitation at least moderate but suboptimally visualized.   7. The aortic valve is tricuspid. There is moderate calcification of the aortic valve. There is severe thickening of the aortic valve. Aortic valve regurgitation is not visualized. Severe aortic valve stenosis. Aortic valve area, by VTI measures 0.51 cm. Aortic valve mean gradient measures 60.0 mmHg. Aortic valve Vmax  measures 4.83 m/s.   8. The inferior vena cava is normal in size with greater than 50% respiratory variability, suggesting right atrial pressure of 3 mmHg.   Patient Profile     87 y.o. female with history of CAD with interior STEMI in 05/2017 complicated by hypotension and bradycardia s/p PCI/DES to the proximal RCA, severe aortic stenosis, GI bleed in 2018, prolonged tobacco use quitting after MI, HLD, and obesity, who was admitted on 6/6 secondary to hip infection and was incidentally noted to have a large AAA, now s/p repair. Post-op course complicated by ongoing resp failure req supplemental oxygen, and NSTEMI w/ new LV dysfxn.  Assessment & Plan    1. NSTEMI, history of CAD/PCI: pt w/ h/o CAD s/p RCA stenting in 2018. Prolonged hospital course this admission 2/2 hip infxn, AAA s/p EVAR, and resp failure. She developed chest pain on the AM  of 6/20, and was subsequently found to have elevated HsTrop (1374  1349).  Echo w/ new LV dysfxn and EF of 30-35% w/ sev mid-apical anteroseptal, anterior, anterolateral, and apical HK.  S/p heparin drip x48 hours last week. Patient is no longer experiencing chest pain.  Cardiac catheterization today showed patent RCA stent with no obstructive disease.  Recommend continuing medical therapy.    2.  Acute systolic heart failure : EF down to 30-35% with wall motion abnormality suggestive of stress-induced cardiomyopathy which is the most likely diagnosis given no obstructive disease noted today.   Continue small dose Toprol.  No ACE inhibitor or ARB due to low blood pressure especially in the setting of severe aortic stenosis. Right heart catheterization showed normal filling pressures and thus no need for diuretics at this point.  3. Acute on chronic respiratory failure: Patient's supplemental oxygen requirement is improving.  This is likely multifactorial due to heart failure and lung disease.  Both seem to be improving.  4. AAA s/p EVAR: Per vasc surgery.  5. Severe AS: Fortunately, no obstructive coronary artery disease to require revascularization.  I am hopeful that the patient will get stronger with physical therapy and rehab and then get urgent outpatient evaluation for TAVR within the next month.  6. Post-operative hip infection: Continue Doxycycline antibiotic per IM.   I discussed with Dr. Allena Katz.  The plan is to discharge the patient early next week likely to rehab.  Signed, Lorine Bears, MD  02/09/2021, 12:36 PM    For questions or updates, please contact   Please consult [email or web address] for contact info under Cardiology/STEMI.

## 2021-02-09 NOTE — Progress Notes (Signed)
Dr. Kirke Corin at bedside in specials recovery speaking with pt. Re: cath results and need for TAVR. Pt. Verbalized understanding of conversation.

## 2021-02-09 NOTE — Progress Notes (Signed)
[facility]  - [place] at [facility]   PATIENT NAME: Monica [name]    MR#:  [id number]  DATE OF BIRTH:  [birth year]  SUBJECTIVE:  overall improving.  Grandson and friend in the room. Patient came back from. No chest pain or shortness of breath. Overall slowly improving.Marland Kitchen  REVIEW OF SYSTEMS:   Review of Systems  Constitutional:  Negative for chills, fever and weight loss.  HENT:  Negative for ear discharge, ear pain and nosebleeds.   Eyes:  Negative for blurred vision, pain and discharge.  Respiratory:  Negative for sputum production, shortness of breath, wheezing and stridor.   Cardiovascular:  Negative for chest pain, palpitations, orthopnea and PND.  Gastrointestinal:  Negative for diarrhea, nausea and vomiting.  Genitourinary:  Negative for frequency and urgency.  Musculoskeletal:  Negative for back pain.  Neurological:  Positive for weakness. Negative for sensory change, speech change and focal weakness.  Psychiatric/Behavioral:  Negative for depression and hallucinations. The patient is not nervous/anxious.   Tolerating Diet:yes Tolerating PT: reahb  DRUG ALLERGIES:  No Known Allergies  VITALS:  Blood pressure 112/63, pulse 82, temperature 98 F (36.7 C), temperature source Oral, resp. rate 20, height 5\' 5"  (1.651 m), weight 93.7 kg, SpO2 92 %.  PHYSICAL EXAMINATION:   Physical Exam  GENERAL:  87 y.o.-year-old patient lying in the bed with no acute distress.  Chronically ill LUNGS: decreased breath sounds bilaterally, no wheezing, rales, rhonchi. No use of accessory muscles of respiration.  CARDIOVASCULAR: S1, S2 normal. No murmurs, rubs, or gallops.  ABDOMEN: Soft, nontender, nondistended. Bowel sounds present. No organomegaly or mass.  EXTREMITIES: no edema b/l.    NEUROLOGIC: nonfocal, generalized weakness PSYCHIATRIC:  patient is alert and oriented x 3.  SKIN: No obvious rash, lesion, or ulcer.   LABORATORY PANEL:  CBC Recent Labs  Lab  02/06/21 0456  WBC 6.2  HGB 11.0*  HCT 33.4*  PLT 301     Chemistries  Recent Labs  Lab 02/09/21 0448  NA 128*  K 4.5  CL 92*  CO2 27  GLUCOSE 109*  BUN 36*  CREATININE 1.18*  CALCIUM 8.7*  AST 35  ALT 19  ALKPHOS 89  BILITOT 1.1    Cardiac Enzymes No results for input(s): TROPONINI in the last 168 hours. RADIOLOGY:  CARDIAC CATHETERIZATION  Result Date: 02/09/2021  LM lesion is 20% stenosed.  Ost 1st Diag to 1st Diag lesion is 20% stenosed.  Ost Cx to Prox Cx lesion is 50% stenosed.  Dist RCA lesion is 30% stenosed.  Prox Cx to Mid Cx lesion is 60% stenosed.  1st Diag lesion is 30% stenosed.  Prox RCA lesion is 10% stenosed.  Mid RCA lesion is 60% stenosed.  1.  Widely patent proximal RCA stent with stable moderate disease in the mid RCA and left circumflex.  No obstructive disease.  The coronary arteries are moderately to severely calcified. 2.  Severely calcified aortic valve with known severe stenosis by echo.  I did not attempt to cross the aortic valve. 3.  Right heart catheterization showed normal right and left-sided filling pressures, mild pulmonary hypertension and mildly reduced cardiac output. Recommendations: The patient appears to be euvolemic. No obstructive coronary artery disease to require revascularization. We can likely proceed with TAVR evaluation in the near future once the patient is less deconditioned.   ASSESSMENT AND PLAN:  87 y.o. female with history of CAD with inferior STEMI in 05/2017 complicated by hypotension and bradycardia status post PCI/DES to  the proximal RCA, severe aortic stenosis, GI bleed in 2018, chronic hypoxic respiratory failure, pulmonary fibrosis, prolonged tobacco use quitting after her MI, HLD, and obesity who was admitted 6/6 for postop hip infection and respiratory failure and incidentally noted to have a large AAA now status post endovascular repair with postoperative course being complicated by NSTEMI and ongoing  respiratory failure requiring significant supplemental oxygen.  Non-STEMI with markedly elevated troponin -- patient followed by [facility] cardiology. --- continue aspirin, Plavix, lisinopril and beta-blockers, Crestor and ranexa. --7/1--cath per Dr Kirke Corin.  Widely patent proximal RCA stent with stable moderate disease in the mid RCA and left circumflex.  No obstructive disease.  The coronary arteries are moderately to severely calcified. 2.  Severely calcified aortic valve with known severe stenosis by echo. NO attempt to cross the aortic valve. 3.  Right heart catheterization showed normal right and left-sided filling pressures, mild pulmonary hypertension and mildly reduced cardiac output.   Recommendations: The patient appears to be euvolemic. No obstructive coronary artery disease to require revascularization. Consider to  proceed with TAVR evaluation in the near future once the patient is less deconditioned.    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 Allena Katz on June 7 -- per infectious disease continue PO doxycycline-- stop date 02/15/21 -- finished 17 days of IV vancomycin and completed rifampin  constipation/lower abdominal pain -- patient had BM  AAA 8.2 cm -- status post endovascular repair  Left renal nodule Incidental finding, minor, given gravity of AAA and AS and hypoxia, this likely has not been discussed with patient and should be followed up only depending on  goals of care after AAA repair - Follow-up MRI as an outpatient  Class II obesity. Estimated body mass index is 36.36 kg/m as calculated from the following:   Height as of this encounter: 5\' 5"  (1.651 m).   Weight as of this encounter: 99.1 kg.    Consults : cardiology, ID, palliative care CODE STATUS: DNR DNI DVT Prophylaxis : Lovenox Level of care: Progressive Cardiac Status is: Inpatient  Remains inpatient appropriate because:Inpatient level of care appropriate due to severity of illness  Dispo: The patient is from: Home              Anticipated d/c is to: SNF              Patient currently is medically optimized for discharge.   Difficult to place patient No  patient overall improving. [facility]. Patient will discharged tomorrow.  I discussed with patient's son Damyah Gugel and he is in agreement with plan.     TOTAL TIME TAKING CARE OF THIS PATIENT: 25 minutes.  >50% time spent on counselling and coordination of care  Note: This dictation was prepared with Dragon dictation along with smaller phrase technology. Any transcriptional errors that result from this process are unintentional.  Cyndia Bent M.D    [facility]   CC: Primary care physician; Patient, No Pcp Per (Inactive) Patient ID: Monica [name],  female   DOB: [birth year], 87 y.o.   MRN: [id number]

## 2021-02-09 NOTE — Care Management Important Message (Signed)
Important Message  Patient Details  Name: Monica [name] MRN: [id number] Date of Birth: 1934/07/22   Medicare Important Message Given:  Other (see comment)  Attempted to review Medicare IM with patient via room phone due to isolation status.  Unable to reach.     [name] 02/09/2021, 3:39 PM

## 2021-02-09 NOTE — Interval H&P Note (Signed)
Cath Lab Visit (complete for each Cath Lab visit)  Clinical Evaluation Leading to the Procedure:   ACS: Yes.    Non-ACS:  n/a     History and Physical Interval Note:  02/09/2021 8:07 AM  Monica [name]  has presented today for surgery, with the diagnosis of severe aortic stenosis; NSTEMI.  The various methods of treatment have been discussed with the patient and family. After consideration of risks, benefits and other options for treatment, the patient has consented to  Procedure(s): RIGHT/LEFT HEART CATH AND CORONARY ANGIOGRAPHY (N/A) as a surgical intervention.  The patient's history has been reviewed, patient examined, no change in status, stable for surgery.  I have reviewed the patient's chart and labs.  Questions were answered to the patient's satisfaction.     Lorine Bears

## 2021-02-09 NOTE — Progress Notes (Signed)
Give report to Robin in specials. Will continue to monitor.

## 2021-02-09 NOTE — Plan of Care (Signed)

## 2021-02-09 NOTE — TOC Progression Note (Signed)
Transition of Care [facility]) - Progression Note    Patient Details  Name: Monica [name] MRN: [id number] Date of Birth: [birth year]  Transition of Care [facility]) CM/SW Contact  Maree Krabbe, LCSW Phone Number: 02/09/2021, 2:54 PM  Clinical Narrative:  Per MD pt can d/c tomorrow. [facility] and [facility] will [facility]. MD has placed consult for COVID test. Insurance auth started through [employer] portal.     Expected Discharge Plan: Skilled Nursing Facility Barriers to Discharge: Continued Medical Work up  Expected Discharge Plan and Services Expected Discharge Plan: Skilled Nursing Facility     Post Acute Care Choice: Skilled Nursing Facility Living arrangements for the past 2 months: Single Family Home                                       Social Determinants of Health (SDOH) Interventions    Readmission Risk Interventions No flowsheet data found.

## 2021-02-09 NOTE — Progress Notes (Signed)
Physical Therapy Treatment Patient Details Name: Monica [name] MRN: [id number] DOB: [birth year] Today's Date: 02/09/2021    History of Present Illness 87 y.o. female with h/o fall with L hip fx and subsequent IM nailing ORIF 2/22..  Pt now admitted with L hip severe cellulitis and likely abscess, s/p  L hip wound I&D and excisional debridement of muscle and fascia 6/7.   Addtionally, CT scan showed a large infrarenal aortic aneurysm & pt is now s/p endovascular repair of AAA on 01/24/21 requiring t/f to ICU And ramained intubated until 6/16, requiring HHNC and at time of re-eval. Pt underwent R radial cardiac cath on 02/09/21. 6L O2 PMH includes CAD, HLD, obesity, aortic stenosis, CAD, GI bleed, acute respiratory failure with hypoxia, STEMI.    PT Comments    Pt received in bed initially reporting fatigue & inability to participate on this date but reluctantly agreeable to bed level exercises with encouragement from PT. Pt performs BLE strengthening exercises with AAROM & tactile cuing for technique. PT observed bleeding from R groin & nurse made aware. Educated pt on limited use/movement and NWB RUE following cardiac cath this morning. Pt on 2.5L/min O2 via nasal cannula with SPO2 92-87% during session with PT providing education re: pursed lip breathing. Pt limited with activity 2/2 fatigue. Will continue to follow pt acutely to progress mobility as able.      Follow Up Recommendations  SNF;Supervision/Assistance - 24 hour     Equipment Recommendations   (TBD in next venue)    Recommendations for Other Services       Precautions / Restrictions Precautions Precautions: Fall Precaution Comments: Wound vac L hip; monitor O2 sats Restrictions Weight Bearing Restrictions: Yes RUE Weight Bearing: Non weight bearing (2/2 cardiac cath on 02/09/21) LLE Weight Bearing: Weight bearing as tolerated    Mobility  Bed Mobility                    Transfers                     Ambulation/Gait                 Stairs             Wheelchair Mobility    Modified Rankin (Stroke Patients Only)       Balance                                            Cognition Arousal/Alertness: Awake/alert Behavior During Therapy: WFL for tasks assessed/performed Overall Cognitive Status: Within Functional Limits for tasks assessed                                 General Comments: Pleasant, agreeable to tx with encouragement      Exercises General Exercises - Lower Extremity Ankle Circles/Pumps: AROM;Left;10 reps;Supine (attempts on RLE but limited ROM 2/2 hx of surgery to ankle) Gluteal Sets: AROM;Strengthening;Both;10 reps;Supine Short Arc Quad: AAROM;Strengthening;Both;10 reps;Supine Heel Slides: AAROM;Strengthening;Both;10 reps;Supine    General Comments        Pertinent Vitals/Pain Pain Assessment: Faces Faces Pain Scale: Hurts little more Pain Location: R groin when inspected by PT, L hip with some exercises Pain Descriptors / Indicators: Grimacing Pain Intervention(s): Monitored during session;Repositioned    Home Living  Prior Function            PT Goals (current goals can now be found in the care plan section) Acute Rehab PT Goals Patient Stated Goal: get back to standing PT Goal Formulation: With patient Time For Goal Achievement: 02/21/21 Potential to Achieve Goals: Fair Progress towards PT goals: Progressing toward goals    Frequency    Min 2X/week      PT Plan Current plan remains appropriate    Co-evaluation              AM-PAC PT "6 Clicks" Mobility   Outcome Measure  Help needed turning from your back to your side while in a flat bed without using bedrails?: A Lot Help needed moving from lying on your back to sitting on the side of a flat bed without using bedrails?: A Lot Help needed moving to and from a bed to a chair (including a  wheelchair)?: Total Help needed standing up from a chair using your arms (e.g., wheelchair or bedside chair)?: Total Help needed to walk in hospital room?: Total Help needed climbing 3-5 steps with a railing? : Total 6 Click Score: 8    End of Session Equipment Utilized During Treatment: Oxygen Activity Tolerance: Patient limited by fatigue Patient left: in bed;with call bell/phone within reach;with bed alarm set Nurse Communication: Mobility status (bleeding R groin, O2 levels during session) PT Visit Diagnosis: Muscle weakness (generalized) (M62.81);Pain;Other abnormalities of gait and mobility (R26.89);Difficulty in walking, not elsewhere classified (R26.2) Pain - Right/Left: Left Pain - part of body: Hip     Time: 1353-1403 PT Time Calculation (min) (ACUTE ONLY): 10 min  Charges:  $Therapeutic Activity: 8-22 mins                     Aleda Grana, PT, DPT 02/09/21, 2:11 PM    [name] 02/09/2021, 2:10 PM

## 2021-02-10 MED ORDER — GUAIFENESIN-DM 100-10 MG/5ML PO SYRP: 5.0000 mL | ORAL_SOLUTION | ORAL | 0 refills | Status: AC | PRN

## 2021-02-10 MED ORDER — ROSUVASTATIN CALCIUM 10 MG PO TABS: 40.0000 mg | ORAL_TABLET | Freq: Every day | ORAL | 0 refills | Status: AC

## 2021-02-10 MED ORDER — OXYCODONE HCL 5 MG PO TABS: 5.0000 mg | ORAL_TABLET | Freq: Four times a day (QID) | ORAL | 0 refills | Status: DC | PRN

## 2021-02-10 MED ORDER — DOCUSATE SODIUM 100 MG PO CAPS: 100.0000 mg | ORAL_CAPSULE | Freq: Two times a day (BID) | ORAL | 0 refills | Status: DC

## 2021-02-10 MED ORDER — DOXYCYCLINE HYCLATE 100 MG PO TABS: 100.0000 mg | ORAL_TABLET | Freq: Two times a day (BID) | ORAL | 0 refills | Status: AC

## 2021-02-10 MED ORDER — RANOLAZINE ER 500 MG PO TB12: 500.0000 mg | ORAL_TABLET | Freq: Two times a day (BID) | ORAL | 0 refills | Status: DC

## 2021-02-10 MED ORDER — PREDNISONE 20 MG PO TABS: 20.0000 mg | ORAL_TABLET | Freq: Every day | ORAL | 0 refills | Status: DC

## 2021-02-10 MED ORDER — CLOPIDOGREL BISULFATE 75 MG PO TABS: 75.0000 mg | ORAL_TABLET | Freq: Every day | ORAL | 0 refills | Status: AC

## 2021-02-10 MED ORDER — ENSURE ENLIVE PO LIQD: 237.0000 mL | Freq: Three times a day (TID) | ORAL | 12 refills | Status: DC

## 2021-02-10 MED ORDER — METOPROLOL SUCCINATE ER 25 MG PO TB24: 25.0000 mg | ORAL_TABLET | Freq: Every day | ORAL | 1 refills | Status: DC

## 2021-02-10 MED ORDER — PANTOPRAZOLE SODIUM 40 MG PO TBEC: 40.0000 mg | DELAYED_RELEASE_TABLET | Freq: Every day | ORAL | 0 refills | Status: AC

## 2021-02-10 NOTE — TOC Transition Note (Signed)
Transition of Care [facility]) - CM/SW Discharge Note   Patient Details  Name: Monica [name] MRN: [id number] Date of Birth: 1934-08-01  [facility] CM/SW Contact:  Gildardo Griffes, LCSW Phone Number: 02/10/2021, 8:56 AM   Clinical Narrative:     Patient will [facility] Anticipated DC date: 02/10/21 Family notified: [name] (son) Transport [id number]  Per MD patient ready for DC to [facility] . RN, patient, patient's family, and facility notified of DC. Discharge Summary sent to facility. RN given number for report  [phone] Room 705. DC packet on chart. Ambulance transport requested for patient.  CSW signing off.  Angeline Slim, LCSW    Final next level of care: Skilled Nursing Facility Barriers to Discharge: No Barriers Identified   Patient Goals and CMS Choice Patient states their goals for this hospitalization and ongoing recovery are:: to go to SNF CMS Medicare.gov Compare Post Acute Care list provided to:: Patient Choice offered to / list presented to : Patient  Discharge Placement              Patient chooses bed at: [facility] [place] Patient to be transferred to facility by: [facility]   Patient and family notified of of transfer: 02/10/21  Discharge Plan and Services     Post Acute Care Choice: Skilled Nursing Facility                               Social Determinants of Health (SDOH) Interventions     Readmission Risk Interventions No flowsheet data found.

## 2021-02-10 NOTE — Discharge Summary (Signed)
[facility] [place] at [facility]   PATIENT NAME: Monica [name]    MR#:  [id number]  DATE OF BIRTH:  January 28, 1934  DATE OF ADMISSION:  01/15/2021 ADMITTING PHYSICIAN: Hannah Beat, MD  DATE OF DISCHARGE: 02/10/2021  PRIMARY CARE PHYSICIAN: Patient, No Pcp Per (Inactive)    ADMISSION DIAGNOSIS:  Abscess of left hip [L02.416] Cellulitis of left lower extremity [L03.116]  DISCHARGE DIAGNOSIS:   non-STEMI severe AS acute on chronic, and diastolic/systolic heart failure chronic respiratory failure on oxygen left hip. Surgical infections status post debridement on June 7 abdominal aortic aneurysm status post endovascular repair SECONDARY DIAGNOSIS:   Past Medical History:  Diagnosis Date  . Aortic stenosis    a. LHC 06/02/17: At least moderate aortic stenosis with a peak to peak gradient of 22 mmHg; b. TTE 05/2017: EF 55-60%, mild HK basal-midinferior wall, Gr1DD, mod to sev AS w/ mean gradient 21 mmHg, valve area 0.99, mild MR, mildly dilated LA   . CAD (coronary artery disease) 2018   a. inferior STEMI 06/02/2017: LHC 06/02/17: LM 20, D1 20%, o-pLCx 50, p-mRCA 100% s/p PCI/DES, mRCA 50, dRCA 30  . GI bleed    a. noted 06/05/2017  . HLD (hyperlipidemia)   . Obesity     HOSPITAL COURSE:  87 y.o. female with history of CAD with inferior STEMI in 05/2017 complicated by hypotension and bradycardia status post PCI/DES to the proximal RCA, severe aortic stenosis, GI bleed in 2018, chronic hypoxic respiratory failure, pulmonary fibrosis, prolonged tobacco use quitting after her MI, HLD, and obesity who was admitted 6/6 for postop hip infection and respiratory failure and incidentally noted to have a large AAA now status post endovascular repair with postoperative course being complicated by NSTEMI and ongoing respiratory failure requiring significant supplemental oxygen.   Non-STEMI with markedly elevated troponin -- patient followed by [facility] cardiology. --- continue  aspirin, Plavix, lisinopril and beta-blockers, Crestor and ranexa. --7/1--cath per Dr Kirke Corin.  Widely patent proximal RCA stent with stable moderate disease in the mid RCA and left circumflex.  No obstructive disease.  The coronary arteries are moderately to severely calcified. 2.  Severely calcified aortic valve with known severe stenosis by echo. NO attempt to cross the aortic valve. 3.  Right heart catheterization showed normal right and left-sided filling pressures, mild pulmonary hypertension and mildly reduced cardiac output.  Recommendations: The patient appears to be euvolemic. No obstructive coronary artery disease to require revascularization. Consider to  proceed with TAVR evaluation in the near future once the patient is less deconditioned.   Acute on chronic combined diastolic/systolic heart failure acute on chronic hypoxic respiratory failure -- Euvolemic--hold po diuretics. Give as needed -- continue beta-blockers, ranexa, statins -- d/ced steroids -- patient now on nasal cannula oxygen --CT angiogram of the chest with patchy subpleural fibrosis, no PE, moderate atelectasis, no edema or large consolidation, no effusion   severe aortic stenosis --- as above--f/u Dr Kirke Corin as out pt   left hip post op surgical infection status post debridement by orthopedic Dr. Allena Katz on June 7 -- per infectious disease continue PO doxycycline-- stop date 02/15/21 -- finished 17 days of IV vancomycin and completed rifampin   constipation/lower abdominal pain -- patient had BM   AAA 8.2 cm -- status post endovascular repair   Left renal nodule Incidental finding, minor, given gravity of AAA and AS and hypoxia, this likely has not been discussed with patient and should be followed up only depending on goals of  care after AAA repair - Follow-up MRI as an outpatient   Class II obesity. Estimated body mass index is 36.36 kg/m as calculated from the following:   Height as of this encounter: 5'  5" (1.651 m).   Weight as of this encounter: 99.1 kg.    Consults : cardiology, ID, palliative care CODE STATUS: DNR DNI DVT Prophylaxis : Lovenox Level of care: Progressive Cardiac Status is: Inpatient   Dispo: The patient is from: Home              Anticipated d/c is to: SNF              Patient currently is medically optimized for discharge.              Difficult to place patient No   patient overall improving. [facility]  CONSULTS OBTAINED:  Treatment Team:  Kennedy Bucker, MD Signa Kell, MD Lynn Ito, MD Antonieta Iba, MD Iran Ouch, MD  DRUG ALLERGIES:  No Known Allergies  DISCHARGE MEDICATIONS:   Allergies as of 02/10/2021   No Known Allergies      Medication List     STOP taking these medications    carvedilol 3.125 MG tablet Commonly known as: COREG   enoxaparin 40 MG/0.4ML injection Commonly known as: LOVENOX       TAKE these medications    aspirin 81 MG EC tablet Take 81 mg by mouth daily.   clopidogrel 75 MG tablet Commonly known as: PLAVIX Take 1 tablet (75 mg total) by mouth daily.   docusate sodium 100 MG capsule Commonly known as: COLACE Take 1 capsule (100 mg total) by mouth 2 (two) times daily.   doxycycline 100 MG tablet Commonly known as: VIBRA-TABS Take 1 tablet (100 mg total) by mouth every 12 (twelve) hours for 5 days.   feeding supplement Liqd Take 237 mLs by mouth 3 (three) times daily between meals.   guaiFENesin-dextromethorphan 100-10 MG/5ML syrup Commonly known as: ROBITUSSIN DM Take 5 mLs by mouth every 4 (four) hours as needed for cough.   metoprolol succinate 25 MG 24 hr tablet Commonly known as: TOPROL-XL Take 1 tablet (25 mg total) by mouth daily.   oxyCODONE 5 MG immediate release tablet Commonly known as: Oxy IR/ROXICODONE Take 1-2 tablets (5-10 mg total) by mouth every 4 (four) hours as needed for moderate pain (pain score 4-6). What changed: You were  already taking a medication with the same name, and this prescription was added. Make sure you understand how and when to take each.   oxyCODONE 5 MG immediate release tablet Commonly known as: Oxy IR/ROXICODONE Take 1 tablet (5 mg total) by mouth every 6 (six) hours as needed for severe pain. What changed: when to take this   pantoprazole 40 MG tablet Commonly known as: PROTONIX Take 1 tablet (40 mg total) by mouth daily.   ranolazine 500 MG 12 hr tablet Commonly known as: RANEXA Take 1 tablet (500 mg total) by mouth 2 (two) times daily.   rosuvastatin 10 MG tablet Commonly known as: CRESTOR Take 4 tablets (40 mg total) by mouth daily. What changed: how much to take        If you experience worsening of your admission symptoms, develop shortness of breath, life threatening emergency, suicidal or homicidal thoughts you must seek medical attention immediately by calling 911 or calling your MD immediately  if symptoms less severe.  You Must read complete instructions/literature along with  all the possible adverse reactions/side effects for all the Medicines you take and that have been prescribed to you. Take any new Medicines after you have completely understood and accept all the possible adverse reactions/side effects.   Please note  You were cared for by a hospitalist during your hospital stay. If you have any questions about your discharge medications or the care you received while you were in the hospital after you are discharged, you can call the unit and asked to speak with the hospitalist on call if the hospitalist that took care of you is not available. Once you are discharged, your primary care physician will handle any further medical issues. Please note that NO REFILLS for any discharge medications will be authorized once you are discharged, as it is imperative that you return to your primary care physician (or establish a relationship with a primary care physician if you do  not have one) for your aftercare needs so that they can reassess your need for medications and monitor your lab values. Today   SUBJECTIVE   No new compalints  VITAL SIGNS:  Blood pressure (!) 108/54, pulse 85, temperature 97.6 F (36.4 C), temperature source Oral, resp. rate 19, height 5\' 5"  (1.651 m), weight 93.7 kg, SpO2 93 %.  I/O:   Intake/Output Summary (Last 24 hours) at 02/10/2021 0849 Last data filed at 02/10/2021 0443 Gross per 24 hour  Intake 373.25 ml  Output 1100 ml  Net -726.75 ml    PHYSICAL EXAMINATION:  GENERAL:  87 y.o.-year-old patient lying in the bed with no acute distress. Chronically ill LUNGS: decreased breath sounds bilaterally, no wheezing, rales, rhonchi. No use of accessory muscles of respiration. CARDIOVASCULAR: S1, S2 normal. No murmurs, rubs, or gallops. ABDOMEN: Soft, nontender, nondistended. Bowel sounds present. No organomegaly or mass. EXTREMITIES: no edema b/l.    NEUROLOGIC: nonfocal, generalized weakness PSYCHIATRIC:  patient is alert and oriented x 3. SKIN: no pressure ulcer DATA REVIEW:   CBC  Recent Labs  Lab 02/06/21 0456  WBC 6.2  HGB 11.0*  HCT 33.4*  PLT 301    Chemistries  Recent Labs  Lab 02/09/21 0448  NA 128*  K 4.5  CL 92*  CO2 27  GLUCOSE 109*  BUN 36*  CREATININE 1.18*  CALCIUM 8.7*  AST 35  ALT 19  ALKPHOS 89  BILITOT 1.1    Microbiology Results   Recent Results (from the past 240 hour(s))  Resp Panel by RT-PCR (Flu A&B, Covid) Nasopharyngeal Swab     Status: None   Collection Time: 02/09/21  2:47 PM   Specimen: Nasopharyngeal Swab; Nasopharyngeal(NP) swabs in vial transport medium  Result Value Ref Range Status   SARS Coronavirus 2 by RT PCR NEGATIVE NEGATIVE Final    Comment: (NOTE) SARS-CoV-2 target nucleic acids are NOT DETECTED.  The SARS-CoV-2 RNA is generally detectable in upper respiratory specimens during the acute phase of infection. The lowest concentration of SARS-CoV-2 viral copies  this assay can detect is 138 copies/mL. A negative result does not preclude SARS-Cov-2 infection and should not be used as the sole basis for treatment or other patient management decisions. A negative result may occur with  improper specimen collection/handling, submission of specimen other than nasopharyngeal swab, presence of viral mutation(s) within the areas targeted by this assay, and inadequate number of viral copies(<138 copies/mL). A negative result must be combined with clinical observations, patient history, and epidemiological information. The expected result is Negative.  Fact Sheet for Patients:  04/12/21  Fact Sheet for Healthcare Providers:  SeriousBroker.ithttps://www.fda.gov/media/152162/download  This test is no t yet approved or cleared by the [place] FDA and  has been authorized for detection and/or diagnosis of SARS-CoV-2 by FDA under an Emergency Use Authorization (EUA). This EUA will remain  in effect (meaning this test can be used) for the duration of the COVID-19 declaration under Section 564(b)(1) of the Act, 21 U.S.C.section 360bbb-3(b)(1), unless the authorization is terminated  or revoked sooner.       Influenza A by PCR NEGATIVE NEGATIVE Final   Influenza B by PCR NEGATIVE NEGATIVE Final    Comment: (NOTE) The Xpert Xpress SARS-CoV-2/FLU/RSV plus assay is intended as an aid in the diagnosis of influenza from Nasopharyngeal swab specimens and should not be used as a sole basis for treatment. Nasal washings and aspirates are unacceptable for Xpert Xpress SARS-CoV-2/FLU/RSV testing.  Fact Sheet for Patients: BloggerCourse.comhttps://www.fda.gov/media/152166/download  Fact Sheet for Healthcare Providers: SeriousBroker.ithttps://www.fda.gov/media/152162/download  This test is not yet approved or cleared by the [place] FDA and has been authorized for detection and/or diagnosis of SARS-CoV-2 by FDA under an Emergency Use Authorization (EUA). This EUA  will remain in effect (meaning this test can be used) for the duration of the COVID-19 declaration under Section 564(b)(1) of the Act, 21 U.S.C. section 360bbb-3(b)(1), unless the authorization is terminated or revoked.  Performed at [facility], [address] [place], [place] [address]     RADIOLOGY:  CARDIAC CATHETERIZATION  Result Date: 02/09/2021  LM lesion is 20% stenosed.  Ost 1st Diag to 1st Diag lesion is 20% stenosed.  Ost Cx to Prox Cx lesion is 50% stenosed.  Dist RCA lesion is 30% stenosed.  Prox Cx to Mid Cx lesion is 60% stenosed.  1st Diag lesion is 30% stenosed.  Prox RCA lesion is 10% stenosed.  Mid RCA lesion is 60% stenosed.  1.  Widely patent proximal RCA stent with stable moderate disease in the mid RCA and left circumflex.  No obstructive disease.  The coronary arteries are moderately to severely calcified. 2.  Severely calcified aortic valve with known severe stenosis by echo.  I did not attempt to cross the aortic valve. 3.  Right heart catheterization showed normal right and left-sided filling pressures, mild pulmonary hypertension and mildly reduced cardiac output. Recommendations: The patient appears to be euvolemic. No obstructive coronary artery disease to require revascularization. We can likely proceed with TAVR evaluation in the near future once the patient is less deconditioned.     CODE STATUS:     Code Status Orders  (From admission, onward)           Start     Ordered   01/23/21 1543  Do not attempt resuscitation (DNR)  Continuous       Question Answer Comment  In the event of cardiac or respiratory ARREST Do not call a "code blue"   In the event of cardiac or respiratory ARREST Do not perform Intubation, CPR, defibrillation or ACLS   In the event of cardiac or respiratory ARREST Use medication by any route, position, wound care, and other measures to relive pain and suffering. May use oxygen, suction and manual treatment of  airway obstruction as needed for comfort.   Comments MOST form on chart.      01/23/21 1542           Code Status History     Date Active Date Inactive Code Status Order ID Comments User Context   01/15/2021 2309  01/23/2021 1542 Full Code [id number]  Hannah Beat, MD Inpatient   10/02/2020 1117 10/09/2020 2150 Full Code [id number]  Lucile Shutters, MD ED   04/30/2018 1018 05/02/2018 1808 DNR [id number]  Adrian Saran, MD Inpatient   04/29/2018 1732 04/30/2018 1018 Full Code [id number]  Houston Siren, MD Inpatient   06/05/2017 0521 06/07/2017 1404 Full Code [id number]  Ihor Austin, MD Inpatient   06/02/2017 0059 06/04/2017 1932 Full Code [id number]  Iran Ouch, MD Inpatient      Advance Directive Documentation    Flowsheet Row Most Recent Value  Type of Advance Directive Healthcare Power of Attorney, Living will  Pre-existing out of facility DNR order (yellow form or pink MOST form) --  "MOST" Form in Place? --        TOTAL TIME TAKING CARE OF THIS PATIENT: 40 minutes.    Enedina Finner M.D  [facility]    CC: Primary care physician; Patient, No Pcp Per (Inactive)

## 2021-02-10 NOTE — Progress Notes (Signed)
Progress Note  Patient Name: Monica [name] Date of Encounter: 02/10/2021  Primary Cardiologist: Lorine Bears, MD  Subjective   No c/p or sob.  Eager for d/c to rehab today.  Inpatient Medications    Scheduled Meds:  (feeding supplement) PROSource Plus  30 mL Oral TID BM   aspirin  81 mg Oral Daily   Chlorhexidine Gluconate Cloth  6 each Topical Daily   clopidogrel  75 mg Oral Daily   docusate sodium  100 mg Oral BID   doxycycline  100 mg Oral Q12H   enoxaparin (LOVENOX) injection  0.5 mg/kg Subcutaneous Q24H   feeding supplement  237 mL Oral TID BM   ipratropium-albuterol  3 mL Nebulization BID   melatonin  5 mg Oral QHS   metoprolol succinate  25 mg Oral Daily   multivitamin with minerals  1 tablet Oral Daily   pantoprazole  40 mg Oral Daily   pneumococcal 23 valent vaccine  0.5 mL Intramuscular Tomorrow-1000   polyethylene glycol  17 g Oral Daily   predniSONE  20 mg Oral Q breakfast   ranolazine  500 mg Oral BID   rosuvastatin  40 mg Oral Daily   sodium chloride flush  10-40 mL Intracatheter Q12H   sodium chloride flush  3 mL Intravenous Q12H   sodium chloride flush  3 mL Intravenous Q12H   Continuous Infusions:  sodium chloride 250 mL (01/29/21 1103)   sodium chloride     PRN Meds: sodium chloride, acetaminophen, bisacodyl, guaiFENesin-dextromethorphan, HYDROmorphone (DILAUDID) injection, ipratropium-albuterol, lactulose, lip balm, nitroGLYCERIN, ondansetron (ZOFRAN) IV, sodium chloride, sodium chloride flush, sodium chloride flush, traZODone   Vital Signs    Vitals:   02/09/21 2007 02/09/21 2041 02/10/21 0443 02/10/21 0741  BP: (!) 104/56  (!) 108/54   Pulse: 83  85   Resp: 18  19   Temp: 97.9 F (36.6 C)  97.6 F (36.4 C)   TempSrc: Oral  Oral   SpO2: 92% 94% 93% 93%  Weight:      Height:        Intake/Output Summary (Last 24 hours) at 02/10/2021 0752 Last data filed at 02/10/2021 0443 Gross per 24 hour  Intake 373.25 ml  Output 1100 ml  Net  -726.75 ml   Filed Weights   02/01/21 0649 02/09/21 0228 02/09/21 0720  Weight: 99.1 kg 93.7 kg 93.7 kg    Physical Exam   GEN: Well nourished, well developed, in no acute distress.  HEENT: Grossly normal.  Neck: Supple, no JVD, carotid bruits, or masses. Cardiac: RRR, 3/6 syst murmur heard throughout - loudest @ upper sternal borders.  No rubs, or gallops. No clubbing, cyanosis, edema.  Radials 2+, DP/PT 2+ and equal bilaterally.  R radial and brachial cath sites are w/o bleeding, bruit, or hematomas. Respiratory:  Respirations regular and unlabored, fine crackles 1/2 up bilat. GI: Soft, nontender, nondistended, BS + x 4. MS: no deformity or atrophy. Skin: warm and dry, no rash. Neuro:  Strength and sensation are intact. Psych: AAOx3.  Normal affect.  Labs    Chemistry Recent Labs  Lab 02/05/21 0732 02/06/21 0456 02/07/21 0448 02/08/21 1127 02/09/21 0448  NA 128*   < > 129* 129* 128*  K 3.9   < > 3.7 4.2 4.5  CL 89*   < > 87* 87* 92*  CO2 29   < > 31 31 27   GLUCOSE 103*   < > 112* 116* 109*  BUN 28*   < > 40* 39*  36*  CREATININE 1.27*   < > 1.38* 1.34* 1.18*  CALCIUM 8.6*   < > 8.8* 9.0 8.7*  PROT  --   --   --   --  6.8  ALBUMIN 2.9*  --   --   --  3.0*  AST  --   --   --   --  35  ALT  --   --   --   --  19  ALKPHOS  --   --   --   --  89  BILITOT  --   --   --   --  1.1  GFRNONAA 41*   < > 38* 39* 45*  ANIONGAP 10   < > 11 11 9    < > = values in this interval not displayed.     Hematology Recent Labs  Lab 02/06/21 0456  WBC 6.2  RBC 4.45  HGB 11.0*  HCT 33.4*  MCV 75.1*  MCH 24.7*  MCHC 32.9  RDW 23.0*  PLT 301    Cardiac Enzymes  Recent Labs  Lab 01/20/21 1336 01/20/21 1515 01/29/21 1054 01/29/21 1241  TROPONINIHS 11 12 1,374* 1,349*     Lipids  Lab Results  Component Value Date   CHOL 143 12/23/2017   HDL 58 12/23/2017   LDLCALC 66 12/23/2017   TRIG 95 12/23/2017   CHOLHDL 2.5 12/23/2017    HbA1c  Lab Results  Component Value  Date   HGBA1C 5.7 (H) 06/02/2017    Radiology    ------------  Telemetry    RSR, PVCs - Personally Reviewed  Cardiac Studies   2D Echocardiogram - 06.20.2022 1. Left ventricular ejection fraction, by estimation, is 30 to 35%. The left ventricle has moderately decreased function. The left ventricle demonstrates regional wall motion abnormalities (see scoring diagram/findings for description). There is moderate left ventricular hypertrophy. Left ventricular diastolic parameters are indeterminate. There is severe hypokinesis of the left ventricular, mid-apical anteroseptal wall, anterior wall, anterolateral wall and apical segment.   2. Right ventricular systolic function is normal. The right ventricular size is mildly enlarged. There is moderately elevated pulmonary artery systolic pressure.   3. Left atrial size was mildly dilated.   4. The mitral valve is degenerative. Mild mitral valve regurgitation.  Mild mitral stenosis. Moderate mitral annular calcification.   5. The aortic valve is tricuspid. There is moderate calcification of the aortic valve. There is severe thickening of the aortic valve. Aortic valve regurgitation is trivial. Severe aortic valve stenosis. Aortic valve area,  by VTI measures 0.54 cm.  Aortic valve mean gradient measures 32.0 mmHg.   6. The inferior vena cava is normal in size with greater than 50% respiratory variability, suggesting right atrial pressure of 3 mmHg.   _____________   Cardiac Catheterization  7.1.2022   1.  Widely patent proximal RCA stent with stable moderate disease in the mid RCA and left circumflex.  No obstructive disease.  The coronary arteries are moderately to severely calcified. 2.  Severely calcified aortic valve with known severe stenosis by echo.  I did not attempt to cross the aortic valve. 3.  Right heart catheterization showed normal right and left-sided filling pressures, mild pulmonary hypertension and mildly reduced cardiac  output.   Recommendations: The patient appears to be euvolemic. No obstructive coronary artery disease to require revascularization. We can likely proceed with TAVR evaluation in the near future once the patient is less deconditioned. _____________   Patient Profile  87 y.o. female with history of CAD with interior STEMI in 05/2017 complicated by hypotension and bradycardia s/p PCI/DES to the proximal RCA, severe aortic stenosis, GI bleed in 2018, prolonged tobacco use quitting after MI, HLD, and obesity, who was admitted on 6/6 secondary to hip infection and was incidentally noted to have a large AAA, now s/p repair. Post-op course complicated by ongoing resp failure req supplemental oxygen, and NSTEMI w/ new LV dysfxn.  Cath 7/1 w/ patent RCA stent, otw nonobs dzs, and nl R heart pressures.  Assessment & Plan    1.  NSTEMI/CAD:  pt w/ h/o CAD s/p RCA stenting in 2018. Prolonged hospital course this admission 2/2 hip infxn, AAA s/p EVAR, and resp failure. She developed chest pain on the AM of 6/20, and was subsequently found to have elevated HsTrop (1374  1349).  Echo w/ new LV dysfxn and EF of 30-35% w/ sev mid-apical anteroseptal, anterior, anterolateral, and apical HK.  S/p heparin drip x48 hours last week. Cath 7/1 w/ patent RCA stent and otw nonobs dzs; nl R heart pressures.  No chest pain.  Dyspnea stable.  Cont med rx w/ asa, plavix, ? blocker, statin, and ranexa.  2.  HFrEF/ICM:  EF 30-35%.  S/p cath 7/1  Nonobs dzs, nl R heart pressures. Euvolemic on exam this AM.  She has not required a diuretic in several days.  No acei/arb/arni in setting of fixed, severe AS.  3.  Acute on chronic resp failure:  Stable on O2 @ 3lpm.  Fibrosis noted on CT w/ persistent crackles bilat.  Steroids/nebs/O2 per IM.  4.  AAA s/p EVAR:  per vasc surgery.  5.  Severe AS:  Plan for outpt structural heart referral and w/u as appropriate.  6.  Post-op hip infection:  Abx per IM.  7.  Acute on CKD III:   Creat pending this AM.  8.  HL:  cont statin rx.  F/u lipids as outpt.  Signed, Nicolasa Ducking, NP  02/10/2021, 7:52 AM    For questions or updates, please contact   Please consult [email or web address] for contact info under Cardiology/STEMI.

## 2021-02-21 ENCOUNTER — Non-Acute Institutional Stay: Payer: Medicare Other | Admitting: Primary Care

## 2021-02-21 ENCOUNTER — Other Ambulatory Visit: Payer: Self-pay

## 2021-02-21 DIAGNOSIS — I714 Abdominal aortic aneurysm, without rupture, unspecified: Secondary | ICD-10-CM

## 2021-02-21 DIAGNOSIS — Z515 Encounter for palliative care: Secondary | ICD-10-CM

## 2021-02-21 DIAGNOSIS — L03116 Cellulitis of left lower limb: Secondary | ICD-10-CM

## 2021-02-21 DIAGNOSIS — I214 Non-ST elevation (NSTEMI) myocardial infarction: Secondary | ICD-10-CM

## 2021-02-21 NOTE — Progress Notes (Signed)
[facility] Consult Note Telephone: [phone]  Fax: [phone]   Date of encounter: 02/21/21 PATIENT NAME: Monica [name] [place] [address]   [phone] (home)  DOB: Dec 11, 1933 MRN: [id number] PRIMARY CARE PROVIDER:    Cephas Darby, [place],  [address] [place] [address] [phone]  REFERRING PROVIDER:   Cephas Darby, [place],  [address] [place] [address] [phone]  RESPONSIBLE PARTY:    Contact Information     Name Relation Home Work Monica [name] Son   [phone]   Monica [name] Daughter   [phone]   Monica [name] [phone]  [phone]       I met face to face with [facility] in [facility]. Palliative Care was asked to follow this patient by consultation request of  Cephas Darby, FNP  to address advance care planning and complex medical decision making. This is the initial visit.                                     ASSESSMENT AND PLAN / RECOMMENDATIONS:   Advance Care Planning/Goals of Care: Goals include to maximize quality of life and symptom management. Our advance care planning conversation included a discussion about:     Exploration of personal, cultural or spiritual beliefs that might influence medical decisions    Symptom Management/Plan:  I met with patient in her nursing home room. She states she does have some pain from her surgeries and debility. She has had surgeries to debride an abscess and repair a AAA. She also experienced a NSTEMI during the process.  She is now  at the nursing home to gain function after serious surgeries. She said she was here in the winter as well. Her goal is to return home to live with her daughter. She states her appetite is good and she does not have any trouble with constipation. She was working with physical therapy this afternoon and the therapy personnel state she has a good is making good  progress. She states she's anticipating some additional surgeries and hope that she can make recovery soon as she has been in hospitals or nursing homes for three months.  I will continue to follow for goal clarification and symptom management.  Follow up Palliative Care Visit: Palliative care will continue to follow for complex medical decision making, advance care planning, and clarification of goals. Return 2-4 weeks or prn.  I spent 35 minutes providing this consultation. More than 50% of the time in this consultation was spent in counseling and care coordination.  PPS: 30%  HOSPICE ELIGIBILITY/DIAGNOSIS: TBD  Chief Complaint: debility  HISTORY OF PRESENT ILLNESS:  Monica [name] is a 87 y.o. year old female  with recent cellulitis and sepsis, NSTEMI, debility and deconditioning. Had infection in June 2022 and has spent last 5 weeks in hospital. She had abscess of L leg complicated by surgical debridment, then AAA repair further complicated by NsTEMI. Now in rehab for deconditioning and hopes to return home to live.   History obtained from review of EMR, discussion with primary team, and interview with family, facility staff/caregiver and/or Ms. Grand.  I reviewed available labs, medications, imaging, studies and related documents from the EMR.  Records reviewed and summarized above.   ROS   General: NAD ENMT: denies dysphagia Cardiovascular: denies chest pain, denies DOE Pulmonary: endorses slight cough, endorses  some  increased SOB Abdomen: endorses improving appetite, denies constipation, endorses continence of bowel GU: denies dysuria, endorses continence of urine MSK:  endorses improving  weakness,  no falls reported Skin: denies rashes or wounds Neurological: endorses pain, denies insomnia Psych: Endorses positive mood Heme/lymph/immuno: denies bruises, abnormal bleeding  Physical Exam: Current and past weights: 217 lbs, down from baseline 225 lbs. Constitutional:  NAD General: frail appearing, obese  EYES: anicteric sclera, lids intact, no discharge  ENMT: intact hearing, oral mucous membranes moist CV:  RRR, no LE edema Pulmonary:  slight increased work of breathing, occ cough, oyxgen 2 L / [place] Abdomen: intake 75%, no ascites GU: deferred MSK: moderate sarcopenia, moves all extremities, non ambulatory Skin: warm and dry, no rashes or wounds on visible skin Neuro:  ++generalized weakness,  no evidence cognitive impairment Psych: non-anxious affect, A and O x 2-3 Hem/lymph/immuno: no widespread bruising  CURRENT PROBLEM LIST:  Patient Active Problem List   Diagnosis Date Noted   HFrEF (heart failure with reduced ejection fraction) (HCC)    Non-ST elevation (NSTEMI) myocardial infarction (HCC)    Hypoxia    Shortness of breath    Pre-op evaluation    Abdominal aortic aneurysm (AAA) without rupture (HCC)    Obesity, Class III, BMI 40-49.9 (morbid obesity) (HCC)    Abscess of left hip 01/15/2021   Acute respiratory failure with hypoxia (HCC) 10/05/2020   Severe aortic stenosis    Closed comminuted intertrochanteric fracture of proximal end of left femur ([facility]) 10/02/2020   Aortic stenosis    Obesity    Cellulitis of left lower extremity 04/29/2018   GI bleed 06/05/2017   Nausea vomiting and diarrhea    Acute ST elevation myocardial infarction (STEMI) of inferior wall ([facility]) 06/02/2017   CAD (coronary artery disease) 2018   PAST MEDICAL HISTORY:  Active Ambulatory Problems    Diagnosis Date Noted   Acute ST elevation myocardial infarction (STEMI) of inferior wall (HCC) 06/02/2017   GI bleed 06/05/2017   Nausea vomiting and diarrhea    Cellulitis of left lower extremity 04/29/2018   Closed comminuted intertrochanteric fracture of proximal end of left femur ([facility]) 10/02/2020   Aortic stenosis    Obesity    CAD (coronary artery disease) 2018   Severe aortic stenosis    Acute respiratory failure with hypoxia (HCC) 10/05/2020   Abscess of  left hip 01/15/2021   Abdominal aortic aneurysm (AAA) without rupture (HCC)    Obesity, Class III, BMI 40-49.9 (morbid obesity) (HCC)    Pre-op evaluation    Shortness of breath    Hypoxia    Non-ST elevation (NSTEMI) myocardial infarction (HCC)    HFrEF (heart failure with reduced ejection fraction) ([facility])    Resolved Ambulatory Problems    Diagnosis Date Noted   No Resolved Ambulatory Problems   Past Medical History:  Diagnosis Date   HLD (hyperlipidemia)    SOCIAL HX:  Social History   Tobacco Use   Smoking status: Former    Packs/day: 0.50    Years: 50.00    Pack years: 25.00    Types: Cigarettes    Quit date: 06/01/2017    Years since quitting: 3.7   Smokeless tobacco: Never  Substance Use Topics   Alcohol use: No   FAMILY HX:  Family History  Problem Relation Age of Onset   Valvular heart disease Mother    Diabetes Mellitus II Daughter       ALLERGIES: No Known Allergies   PERTINENT MEDICATIONS:  Outpatient Encounter Medications as of 02/21/2021  Medication Sig   aspirin 81 MG EC tablet Take 81 mg by mouth daily.   clopidogrel (PLAVIX) 75 MG tablet Take 1 tablet (75 mg total) by mouth daily.   docusate sodium (COLACE) 100 MG capsule Take 1 capsule (100 mg total) by mouth 2 (two) times daily.   feeding supplement (ENSURE ENLIVE / ENSURE PLUS) LIQD Take 237 mLs by mouth 3 (three) times daily between meals.   guaiFENesin-dextromethorphan (ROBITUSSIN DM) 100-10 MG/5ML syrup Take 5 mLs by mouth every 4 (four) hours as needed for cough.   metoprolol succinate (TOPROL-XL) 25 MG 24 hr tablet Take 1 tablet (25 mg total) by mouth daily.   oxyCODONE (OXY IR/ROXICODONE) 5 MG immediate release tablet Take 1-2 tablets (5-10 mg total) by mouth every 4 (four) hours as needed for moderate pain (pain score 4-6).   oxyCODONE (OXY IR/ROXICODONE) 5 MG immediate release tablet Take 1 tablet (5 mg total) by mouth every 6 (six) hours as needed for severe pain.   pantoprazole (PROTONIX)  40 MG tablet Take 1 tablet (40 mg total) by mouth daily.   ranolazine (RANEXA) 500 MG 12 hr tablet Take 1 tablet (500 mg total) by mouth 2 (two) times daily.   rosuvastatin (CRESTOR) 10 MG tablet Take 4 tablets (40 mg total) by mouth daily.   No facility-administered encounter medications on file as of 02/21/2021.   Thank you for the opportunity to participate in the care of Ms. Luckey.  The palliative care team will continue to follow. Please call our office at [phone] if we can be of additional assistance.   Jason Coop, NP , DNP, AGPCNP-BC, ACHPN  COVID-19 PATIENT SCREENING TOOL Asked and negative response unless otherwise noted:  Have you had symptoms of covid, tested positive or been in contact with someone with symptoms/positive test in the past 5-10 days?

## 2021-02-22 ENCOUNTER — Telehealth: Payer: Self-pay | Admitting: Cardiovascular Disease

## 2021-02-22 DIAGNOSIS — I350 Nonrheumatic aortic (valve) stenosis: Secondary | ICD-10-CM

## 2021-02-22 NOTE — Telephone Encounter (Signed)
Patient son calling to ask about scheduling [facility] replacement at [facility].    [facility] at [facility] and plans dc from there either 7/22 or 7/28 th.    If able patient would like to go directly to [facility] for procedure.

## 2021-02-22 NOTE — Telephone Encounter (Signed)
Spoke with the patients son. The patient is still residing at [facility]. Pt son sts that she is progressing and is able to be transferred from the bed to a chair with out a lift.  Evonnie Dawes that the TAVR team requires a work up of testing and evaluations prior to surgery. Very unlikely that the patient would be able to go from her SNL into the hospital for surgery.  Patient is scheduled for her re-evaluation with [facility] on 03/01/21 with the possibility of being d/c home. She is wanting to move fwd with the TAVR asap.  Evonnie Dawes that I will fwd the update to Dr. Kirke Corin to advise. Derek verbalized understanding.

## 2021-02-23 NOTE — Telephone Encounter (Signed)
The work-up has to be done in the outpatient setting and her mobility has to be reasonable.  We can go ahead and request an appointment with the TAVR team in [place] later this month.

## 2021-02-23 NOTE — Telephone Encounter (Signed)
Spoke with the patients son [name] and made him aware of Dr. Jari Sportsman response and recommendation. Advised him that the referral to the TAVR team has been placed and their tem will be in contact with him to discuss the next steps/schedule a consult.  Derek verbalized understanding and voiced appreciation for the assistance.

## 2021-02-27 ENCOUNTER — Telehealth: Payer: Self-pay

## 2021-02-27 NOTE — Telephone Encounter (Signed)
Left message for [name], the pt's son, to contact me in regards to scheduling TAVR evaluation for the pt.

## 2021-02-27 NOTE — Telephone Encounter (Signed)
Derek returned my call and at this time the pt is very limited from a mobility standpoint and she is currently undergoing rehabilitation at a SNF. I advised [name] of the appointments needed for TAVR evaluation and that these appointments would take place in [place].  At this time he would like to speak with the pt and plans to call me back to discuss timing for initial consultation.

## 2021-03-14 ENCOUNTER — Other Ambulatory Visit: Payer: Self-pay

## 2021-03-14 ENCOUNTER — Other Ambulatory Visit (INDEPENDENT_AMBULATORY_CARE_PROVIDER_SITE_OTHER): Payer: Self-pay | Admitting: Vascular Surgery

## 2021-03-14 ENCOUNTER — Non-Acute Institutional Stay: Payer: Medicare Other | Admitting: Primary Care

## 2021-03-14 DIAGNOSIS — Z8679 Personal history of other diseases of the circulatory system: Secondary | ICD-10-CM

## 2021-03-14 DIAGNOSIS — I714 Abdominal aortic aneurysm, without rupture, unspecified: Secondary | ICD-10-CM

## 2021-03-14 DIAGNOSIS — I5020 Unspecified systolic (congestive) heart failure: Secondary | ICD-10-CM

## 2021-03-14 DIAGNOSIS — R0602 Shortness of breath: Secondary | ICD-10-CM

## 2021-03-14 DIAGNOSIS — K5901 Slow transit constipation: Secondary | ICD-10-CM | POA: Insufficient documentation

## 2021-03-14 NOTE — Progress Notes (Signed)
[facility] Consult Note Telephone: [phone]  Fax: [phone]    Date of encounter: 03/14/21 PATIENT NAME: Monica [name] [place] [id number]   [phone] (home)  DOB: Dec 27, 1933 MRN: [id number] PRIMARY CARE PROVIDER:    Cephas [name], [place],  [address] [place] [address] [phone]  REFERRING PROVIDER:   Cephas [name], [address] [place],  [place] [address] [phone]  RESPONSIBLE PARTY:    Contact Information     Name Relation Home Work Mobile   Monica [name] Son   [phone]   Monica [name] Daughter   [phone]   Monica [name] [phone]  [phone]        I met face to face with patient in [facility]. Palliative Care was asked to follow this patient by consultation request of  Monica Darby, FNP to address advance care planning and complex medical decision making. This is a follow up visit.                                   ASSESSMENT AND PLAN / RECOMMENDATIONS:   Advance Care Planning/Goals of Care: Goals include to maximize quality of life and symptom management. Our advance care planning conversation included a discussion about:    Exploration of personal, cultural or spiritual beliefs that might influence medical decisions  Review of an  advance directive document  CODE STATUS: DNR, MOST with comfort measures signed by patient. These were uploaded to [facility] system. Patient hopes to improve enough to go home, where she lives with her daughter.  Symptom Management/Plan:  Intake: Weight was 240 lbs in 2/22, now is 208 lbs, a 32 pound loss or 13%. Continue to monitor for dietary needs.  Constipation: Has had increased constipation. SNF NP is rx with [facility], enemas, senna, movantik. Encouraged patient to be more active as well. Continue with bowel program and may need enema.I recommend also use bisacodyl suppositories 1-2  pr.  Follow up  Palliative Care Visit: Palliative care will continue to follow for complex medical decision making, advance care planning, and clarification of goals. Return 2-4 weeks or prn.  I spent 25 minutes providing this consultation. More than 50% of the time in this consultation was spent in counseling and care coordination.   PPS: 30%  HOSPICE ELIGIBILITY/DIAGNOSIS: TBD  Chief Complaint: constipation  HISTORY OF PRESENT ILLNESS:  Monica [name] is a 87 y.o. year old female  with constipation, HF, cellulitis of hip, s/p hip fx 2/22, Non Stemi 6/22. In SNF for rehab but endorses she is mostly in bed. States she wants to walk but not sure when she will be able to. Reports increased constipation, endorses good appetite .   History obtained from review of EMR, discussion with primary team, and interview with family, facility staff/caregiver and/or Monica [name].  I reviewed available labs, medications, imaging, studies and related documents from the EMR.  Records reviewed and summarized above.   Patient Active Problem List   Diagnosis Date Noted   Constipation due to slow transit 03/14/2021   HFrEF (heart failure with reduced ejection fraction) (HCC)    Non-ST elevation (NSTEMI) myocardial infarction (HCC)    Hypoxia    Shortness of breath    Pre-op evaluation    Abdominal aortic aneurysm (AAA) without rupture (HCC)    Obesity, Class III, BMI 40-49.9 (morbid obesity) ([facility])    Abscess of left  hip 01/15/2021   Acute respiratory failure with hypoxia (HCC) 10/05/2020   Severe aortic stenosis    Closed comminuted intertrochanteric fracture of proximal end of left femur ([place]) 10/02/2020   Aortic stenosis    Obesity    Cellulitis of left lower extremity 04/29/2018   GI bleed 06/05/2017   Nausea vomiting and diarrhea    Acute ST elevation myocardial infarction (STEMI) of inferior wall ([facility]) 06/02/2017   CAD (coronary artery disease) 2018    ROS  General: NAD ENMT: denies  dysphagia Cardiovascular: denies chest pain, denies DOE Pulmonary: denies cough, denies increased SOB Abdomen: endorses good appetite, endorses constipation, endorses continence of bowel GU: denies dysuria, endorses continence of urine MSK:  endorses weakness,  no falls reported Skin: denies rashes or wounds Neurological: denies pain, denies insomnia Psych: Endorses positive mood Heme/lymph/immuno: denies bruises, abnormal bleeding  Physical Exam: Current and past weights:208, weighted 240 in 2/22. Constitutional: NAD General: frail appearing, obese  EYES: anicteric sclera, lids intact, no discharge  ENMT: intact hearing, oral mucous membranes moist, dentition intact CV:  no LE edema Pulmonary:  no increased work of breathing, no cough, room air Abdomen: intake 50%,  no ascites GU: deferred MSK: moderate  sarcopenia, moves all extremities, non ambulatory Skin: warm and dry, no rashes or wounds on visible skin Neuro:  + generalized weakness,  mild  to moderate cognitive impairment Psych: non-anxious affect, A and O x 2-3 Hem/lymph/immuno: no widespread bruising  Outpatient Encounter Medications as of 03/14/2021  Medication Sig   aspirin 81 MG EC tablet Take 81 mg by mouth daily.   clopidogrel (PLAVIX) 75 MG tablet Take 1 tablet (75 mg total) by mouth daily.   furosemide (LASIX) 20 MG tablet Take 20 mg by mouth daily.   guaiFENesin-dextromethorphan (ROBITUSSIN DM) 100-10 MG/5ML syrup Take 5 mLs by mouth every 4 (four) hours as needed for cough.   magnesium hydroxide (MILK OF MAGNESIA) 400 MG/5ML suspension Take 30 mLs by mouth daily. X 3 days for constipation   metoprolol succinate (TOPROL-XL) 25 MG 24 hr tablet Take 1 tablet (25 mg total) by mouth daily.   naloxegol oxalate (MOVANTIK) 25 MG TABS tablet Take by mouth daily.   ondansetron (ZOFRAN-ODT) 4 MG disintegrating tablet Take 4 mg by mouth every 6 (six) hours as needed for nausea or vomiting.   oxyCODONE (OXY IR/ROXICODONE) 5  MG immediate release tablet Take 1-2 tablets (5-10 mg total) by mouth every 4 (four) hours as needed for moderate pain (pain score 4-6).   oxyCODONE (OXY IR/ROXICODONE) 5 MG immediate release tablet Take 1 tablet (5 mg total) by mouth every 6 (six) hours as needed for severe pain.   pantoprazole (PROTONIX) 40 MG tablet Take 1 tablet (40 mg total) by mouth daily.   ranolazine (RANEXA) 500 MG 12 hr tablet Take 1 tablet (500 mg total) by mouth 2 (two) times daily.   rosuvastatin (CRESTOR) 10 MG tablet Take 4 tablets (40 mg total) by mouth daily.   senna (SENOKOT) 8.6 MG TABS tablet Take 2 tablets by mouth 2 (two) times daily.   Sodium Phosphates (RA SALINE ENEMA RE) Place 1 application rectally daily as needed. constipation   vitamin C (ASCORBIC ACID) 500 MG tablet Take 500 mg by mouth daily.   [DISCONTINUED] docusate sodium (COLACE) 100 MG capsule Take 1 capsule (100 mg total) by mouth 2 (two) times daily.   [DISCONTINUED] feeding supplement (ENSURE ENLIVE / ENSURE PLUS) LIQD Take 237 mLs by mouth 3 (three) times daily between  meals.   No facility-administered encounter medications on file as of 03/14/2021.    Thank you for the opportunity to participate in the care of Ms. Brownlee.  The palliative care team will continue to follow. Please call our office at [phone] if we can be of additional assistance.   Jason Coop, NP   COVID-19 PATIENT SCREENING TOOL Asked and negative response unless otherwise noted:   Have you had symptoms of covid, tested positive or been in contact with someone with symptoms/positive test in the past 5-10 days?

## 2021-03-16 ENCOUNTER — Ambulatory Visit (INDEPENDENT_AMBULATORY_CARE_PROVIDER_SITE_OTHER): Payer: Medicare Other

## 2021-03-16 ENCOUNTER — Other Ambulatory Visit: Payer: Self-pay

## 2021-03-16 ENCOUNTER — Ambulatory Visit (INDEPENDENT_AMBULATORY_CARE_PROVIDER_SITE_OTHER): Payer: Medicare Other | Admitting: Nurse Practitioner

## 2021-03-16 ENCOUNTER — Encounter (INDEPENDENT_AMBULATORY_CARE_PROVIDER_SITE_OTHER): Payer: Self-pay | Admitting: Nurse Practitioner

## 2021-03-16 VITALS — BP 149/92 | HR 90 | Resp 16

## 2021-03-16 DIAGNOSIS — I714 Abdominal aortic aneurysm, without rupture, unspecified: Secondary | ICD-10-CM

## 2021-03-16 DIAGNOSIS — R112 Nausea with vomiting, unspecified: Secondary | ICD-10-CM

## 2021-03-16 DIAGNOSIS — R197 Diarrhea, unspecified: Secondary | ICD-10-CM

## 2021-03-16 DIAGNOSIS — Z8679 Personal history of other diseases of the circulatory system: Secondary | ICD-10-CM | POA: Diagnosis not present

## 2021-03-16 DIAGNOSIS — Z9889 Other specified postprocedural states: Secondary | ICD-10-CM

## 2021-03-19 ENCOUNTER — Ambulatory Visit: Payer: Medicare Other | Admitting: Physician Assistant

## 2021-03-19 NOTE — Progress Notes (Deleted)
Office Visit    Patient Name: Monica [name] Date of Encounter: 03/19/2021  PCP:  Wardell Honour, FNP   [facility]  Cardiologist:  Lorine Bears, MD *** Advanced Practice Provider:  No care team member to display Electrophysiologist:  None  {Press F2 to show EP APP, CHF, sleep or structural heart MD               :[id number]}  { Click here to update then REFRESH NOTE - MD (PCP) or APP (Team Member)  Change PCP Type for MD, Specialty for APP is either Cardiology or Clinical Cardiac Electrophysiology  :[id number]}   Chief Complaint    No chief complaint on file.   87 y.o. female   Past Medical History    Past Medical History:  Diagnosis Date   Aortic stenosis    a. LHC 06/02/17: At least moderate aortic stenosis with a peak to peak gradient of 22 mmHg; b. TTE 05/2017: EF 55-60%, mild HK basal-midinferior wall, Gr1DD, mod to sev AS w/ mean gradient 21 mmHg, valve area 0.99, mild MR, mildly dilated LA    CAD (coronary artery disease) 2018   a. inferior STEMI 06/02/2017: LHC 06/02/17: LM 20, D1 20%, o-pLCx 50, p-mRCA 100% s/p PCI/DES, mRCA 50, dRCA 30   GI bleed    a. noted 06/05/2017   HLD (hyperlipidemia)    Obesity    Past Surgical History:  Procedure Laterality Date   ANKLE RECONSTRUCTION  1956   also ORIF of right arm   CORONARY STENT INTERVENTION N/A 06/01/2017   Procedure: Coronary/Graft Acute MI Revascularization;  Surgeon: Iran Ouch, MD;  Location: [facility];  Service: Cardiovascular;  Laterality: N/A;   ENDOVASCULAR REPAIR/STENT GRAFT N/A 01/24/2021   Procedure: ENDOVASCULAR REPAIR/STENT GRAFT;  Surgeon: Renford Dills, MD;  Location: [facility];  Service: Cardiovascular;  Laterality: N/A;   INCISION AND DRAINAGE HIP Left 01/16/2021   Procedure: IRRIGATION AND DEBRIDEMENT HIP;  Surgeon: Signa Kell, MD;  Location: [facility] OR[facility];  Service: Orthopedics;  Laterality: Left;   INTRAMEDULLARY (IM) NAIL  INTERTROCHANTERIC Left 10/03/2020   Procedure: INTRAMEDULLARY (IM) NAIL INTERTROCHANTRIC;  Surgeon: Signa Kell, MD;  Location: [facility] OR[facility];  Service: Orthopedics;  Laterality: Left;   LEFT HEART CATH AND CORONARY ANGIOGRAPHY N/A 06/01/2017   Procedure: LEFT HEART CATH AND CORONARY ANGIOGRAPHY;  Surgeon: Iran Ouch, MD;  Location: [facility];  Service: Cardiovascular;  Laterality: N/A;   RIGHT/LEFT HEART CATH AND CORONARY ANGIOGRAPHY N/A 02/09/2021   Procedure: RIGHT/LEFT HEART CATH AND CORONARY ANGIOGRAPHY;  Surgeon: Iran Ouch, MD;  Location: [facility];  Service: Cardiovascular;  Laterality: N/A;    Allergies  No Known Allergies  History of Present Illness    Monica [name] is a 87 y.o. female with PMH as above. ***  Home Medications   Current Outpatient Medications  Medication Instructions   aspirin 81 mg, Oral, Daily   clopidogrel (PLAVIX) 75 mg, Oral, Daily   furosemide (LASIX) 20 mg, Oral, Daily   guaiFENesin-dextromethorphan (ROBITUSSIN DM) 100-10 MG/5ML syrup 5 mLs, Oral, Every 4 hours PRN   metoprolol succinate (TOPROL-XL) 25 mg, Oral, Daily   naloxegol oxalate (MOVANTIK) 25 MG TABS tablet Oral, Daily   ondansetron (ZOFRAN-ODT) 4 mg, Oral, Every 6 hours PRN   oxyCODONE (OXY IR/ROXICODONE) 5-10 mg, Oral, Every 4 hours PRN   oxyCODONE (OXY IR/ROXICODONE) 5 mg, Oral, Every 6 hours PRN   pantoprazole (PROTONIX) 40 mg,  Oral, Daily   ranolazine (RANEXA) 500 mg, Oral, 2 times daily   rosuvastatin (CRESTOR) 40 mg, Oral, Daily   senna (SENOKOT) 8.6 MG TABS tablet 2 tablets, Oral, 2 times daily   Sodium Phosphates (RA SALINE ENEMA RE) 1 application, Rectal, Daily PRN, constipation   vitamin C (ASCORBIC ACID) 500 mg, Oral, Daily     Review of Systems    ***.   All other systems reviewed and are otherwise negative except as noted above.  Physical Exam    VS:  There were no vitals taken for this visit. , BMI There is no height or weight on file to  calculate BMI. GEN: Well nourished, well developed, in no acute distress. HEENT: normal. Neck: Supple, no JVD, carotid bruits, or masses. Cardiac: RRR, no murmurs, rubs, or gallops. No clubbing, cyanosis, edema.  Radials/DP/PT 2+ and equal bilaterally.  Respiratory:  Respirations regular and unlabored, clear to auscultation bilaterally. GI: Soft, nontender, nondistended, BS + x 4. MS: no deformity or atrophy. Skin: warm and dry, no rash. Neuro:  Strength and sensation are intact. Psych: Normal affect.  Accessory Clinical Findings    ECG personally reviewed by me today - *** - no acute changes.  VITALS Reviewed today   Temp Readings from Last 3 Encounters:  02/10/21 97.8 F (36.6 C) (Oral)  10/09/20 (!) 97.2 F (36.2 C)  05/02/18 98.2 F (36.8 C) (Oral)   BP Readings from Last 3 Encounters:  03/16/21 (!) 149/92  02/10/21 110/60  10/09/20 101/69   Pulse Readings from Last 3 Encounters:  03/16/21 90  02/10/21 82  10/09/20 (!) 101    Wt Readings from Last 3 Encounters:  02/09/21 206 lb 9.1 oz (93.7 kg)  10/02/20 240 lb (108.9 kg)  05/16/20 256 lb (116.1 kg)     LABS  reviewed today   Lab Results  Component Value Date   WBC 6.2 02/06/2021   HGB 11.0 (L) 02/06/2021   HCT 33.4 (L) 02/06/2021   MCV 75.1 (L) 02/06/2021   PLT 301 02/06/2021   Lab Results  Component Value Date   CREATININE 1.18 (H) 02/09/2021   BUN 36 (H) 02/09/2021   NA 128 (L) 02/09/2021   K 4.5 02/09/2021   CL 92 (L) 02/09/2021   CO2 27 02/09/2021   Lab Results  Component Value Date   ALT 19 02/09/2021   AST 35 02/09/2021   ALKPHOS 89 02/09/2021   BILITOT 1.1 02/09/2021   Lab Results  Component Value Date   CHOL 143 12/23/2017   HDL 58 12/23/2017   LDLCALC 66 12/23/2017   TRIG 95 12/23/2017   CHOLHDL 2.5 12/23/2017    Lab Results  Component Value Date   HGBA1C 5.7 (H) 06/02/2017   No results found for: TSH   STUDIES/PROCEDURES reviewed today   ***  Assessment & Plan     ***  Medication changes: *** Labs ordered: *** Studies / Imaging ordered: *** Future considerations: *** Disposition: ***  *Please be aware that the above documentation was completed voice recognition software and may contain dictation errors.    Total time spent with patient today *** minutes. This includes reviewing records, evaluating the patient, and coordinating care. Face-to-face time >50%.    Lennon Alstrom, PA-C 03/19/2021

## 2021-03-26 ENCOUNTER — Encounter (INDEPENDENT_AMBULATORY_CARE_PROVIDER_SITE_OTHER): Payer: Self-pay | Admitting: Nurse Practitioner

## 2021-03-26 NOTE — Progress Notes (Signed)
Subjective:    Patient ID: Monica [name], female    DOB: 23-Sep-1933, 87 y.o.   MRN: [id number] Chief Complaint  Patient presents with   Follow-up    [facility] 1 month stent repair    The patient returns to the office for surveillance of an abdominal aortic aneurysm status post stent graft placement on 01/24/2021.   Patient denies abdominal pain or back pain, no other abdominal complaints. No groin related complaints. No symptoms consistent with distal embolization No changes in claudication distance.   There have been no interval changes in his overall healthcare since his last visit.   Patient denies amaurosis fugax or TIA symptoms. There is no history of claudication or rest pain symptoms of the lower extremities. The patient denies angina or shortness of breath.  He does report having nausea and vomiting however these are not associated with food phobia.  Nausea and vomiting also happens at random times not always associated with eating.  Duplex US of the aorta and iliac arteries shows a 7.6 AAA sac with no endoleak, slight decrease in the sac compared to the previous study, which was a CT scan measuring 7.8 cm..    Review of Systems  Gastrointestinal:  Positive for nausea and vomiting. Negative for abdominal pain.  All other systems reviewed and are negative.     Objective:   Physical Exam Vitals reviewed.  Constitutional:      Appearance: She is obese.  HENT:     Head: Normocephalic.  Cardiovascular:     Rate and Rhythm: Normal rate.     Pulses: Normal pulses.  Pulmonary:     Effort: Pulmonary effort is normal.  Neurological:     Mental Status: She is alert and oriented to person, place, and time.     Motor: Weakness present.     Gait: Gait abnormal.  Psychiatric:        Mood and Affect: Mood normal.        Behavior: Behavior normal.        Thought Content: Thought content normal.        Judgment: Judgment normal.    BP (!) 149/92 (BP Location: Left Arm)   Pulse 90    Resp 16   Past Medical History:  Diagnosis Date   Aortic stenosis    a. LHC 06/02/17: At least moderate aortic stenosis with a peak to peak gradient of 22 mmHg; b. TTE 05/2017: EF 55-60%, mild HK basal-midinferior wall, Gr1DD, mod to sev AS w/ mean gradient 21 mmHg, valve area 0.99, mild MR, mildly dilated LA    CAD (coronary artery disease) 2018   a. inferior STEMI 06/02/2017: LHC 06/02/17: LM 20, D1 20%, o-pLCx 50, p-mRCA 100% s/p PCI/DES, mRCA 50, dRCA 30   GI bleed    a. noted 06/05/2017   HLD (hyperlipidemia)    Obesity     Social History   Socioeconomic History   Marital status: Married    Spouse name: Not on file   Number of children: Not on file   Years of education: Not on file   Highest education level: Not on file  Occupational History   Occupation: retired  Tobacco Use   Smoking status: Former    Packs/day: 0.50    Years: 50.00    Pack years: 25.00    Types: Cigarettes    Quit date: 06/01/2017    Years since quitting: 3.8   Smokeless tobacco: Never  Vaping Use   Vaping Use: Never used  Substance and Sexual Activity   Alcohol use: No   Drug use: No   Sexual activity: Never    Partners: Female  Other Topics Concern   Not on file  Social History Narrative   Not on file   Social Determinants of Health   Financial Resource Strain: Not on file  Food Insecurity: Not on file  Transportation Needs: Not on file  Physical Activity: Not on file  Stress: Not on file  Social Connections: Not on file  Intimate Partner Violence: Not on file    Past Surgical History:  Procedure Laterality Date   ANKLE RECONSTRUCTION  1956   also ORIF of right arm   CORONARY STENT INTERVENTION N/A 06/01/2017   Procedure: Coronary/Graft Acute MI Revascularization;  Surgeon: Iran Ouch, MD;  Location: [facility];  Service: Cardiovascular;  Laterality: N/A;   ENDOVASCULAR REPAIR/STENT GRAFT N/A 01/24/2021   Procedure: ENDOVASCULAR REPAIR/STENT GRAFT;  Surgeon:  Renford Dills, MD;  Location: [facility];  Service: Cardiovascular;  Laterality: N/A;   INCISION AND DRAINAGE HIP Left 01/16/2021   Procedure: IRRIGATION AND DEBRIDEMENT HIP;  Surgeon: Signa Kell, MD;  Location: [facility];  Service: Orthopedics;  Laterality: Left;   INTRAMEDULLARY (IM) NAIL INTERTROCHANTERIC Left 10/03/2020   Procedure: INTRAMEDULLARY (IM) NAIL INTERTROCHANTRIC;  Surgeon: Signa Kell, MD;  Location: [facility];  Service: Orthopedics;  Laterality: Left;   LEFT HEART CATH AND CORONARY ANGIOGRAPHY N/A 06/01/2017   Procedure: LEFT HEART CATH AND CORONARY ANGIOGRAPHY;  Surgeon: Iran Ouch, MD;  Location: [facility];  Service: Cardiovascular;  Laterality: N/A;   RIGHT/LEFT HEART CATH AND CORONARY ANGIOGRAPHY N/A 02/09/2021   Procedure: RIGHT/LEFT HEART CATH AND CORONARY ANGIOGRAPHY;  Surgeon: Iran Ouch, MD;  Location: [facility];  Service: Cardiovascular;  Laterality: N/A;    Family History  Problem Relation Age of Onset   Valvular heart disease Mother    Diabetes Mellitus II Daughter     No Known Allergies  CBC Latest Ref Rng & Units 02/06/2021 02/01/2021 01/31/2021  WBC 4.0 - 10.5 K/uL 6.2 8.1 9.1  Hemoglobin 12.0 - 15.0 g/dL 11.0(L) 10.4(L) 10.9(L)  Hematocrit 36.0 - 46.0 % 33.4(L) 32.5(L) 33.9(L)  Platelets 150 - 400 K/uL 301 272 288      CMP     Component Value Date/Time   NA 128 (L) 02/09/2021 0448   K 4.5 02/09/2021 0448   CL 92 (L) 02/09/2021 0448   CO2 27 02/09/2021 0448   GLUCOSE 109 (H) 02/09/2021 0448   BUN 36 (H) 02/09/2021 0448   CREATININE 1.18 (H) 02/09/2021 0448   CALCIUM 8.7 (L) 02/09/2021 0448   PROT 6.8 02/09/2021 0448   ALBUMIN 3.0 (L) 02/09/2021 0448   AST 35 02/09/2021 0448   ALT 19 02/09/2021 0448   ALKPHOS 89 02/09/2021 0448   BILITOT 1.1 02/09/2021 0448   GFRNONAA 45 (L) 02/09/2021 0448   GFRAA >60 05/02/2018 0336     No results found.     Assessment & Plan:   1. Abdominal aortic  aneurysm (AAA) without rupture (HCC) Recommend: Patient is status post successful endovascular repair of the AAA.   No further intervention is required at this time.   No endoleak is detected and the aneurysm sac is stable.  The patient will continue antiplatelet therapy as prescribed as well as aggressive management of hyperlipidemia. Exercise is again strongly encouraged.   However, endografts require continued surveillance with ultrasound or CT scan. This is  mandatory to detect any changes that allow repressurization of the aneurysm sac.  The patient is informed that this would be asymptomatic.  The patient is reminded that lifelong routine surveillance is a necessity with an endograft. Patient will continue to follow-up at 6 month intervals with ultrasound of the aorta.   2. Nausea vomiting and diarrhea The patient has been experiencing some nausea and vomiting.  It does not necessarily sound like mesenteric stenosis as there is no food phobia and the nausea is not predictable however we will also evaluate the mesenteric arteries at her upcoming study.   Current Outpatient Medications on File Prior to Visit  Medication Sig Dispense Refill   aspirin 81 MG EC tablet Take 81 mg by mouth daily.     clopidogrel (PLAVIX) 75 MG tablet Take 1 tablet (75 mg total) by mouth daily. 30 tablet 0   furosemide (LASIX) 20 MG tablet Take 20 mg by mouth daily.     guaiFENesin-dextromethorphan (ROBITUSSIN DM) 100-10 MG/5ML syrup Take 5 mLs by mouth every 4 (four) hours as needed for cough. 118 mL 0   metoprolol succinate (TOPROL-XL) 25 MG 24 hr tablet Take 1 tablet (25 mg total) by mouth daily. 30 tablet 1   naloxegol oxalate (MOVANTIK) 25 MG TABS tablet Take by mouth daily.     ondansetron (ZOFRAN-ODT) 4 MG disintegrating tablet Take 4 mg by mouth every 6 (six) hours as needed for nausea or vomiting.     oxyCODONE (OXY IR/ROXICODONE) 5 MG immediate release tablet Take 1-2 tablets (5-10 mg total) by mouth  every 4 (four) hours as needed for moderate pain (pain score 4-6). 60 tablet 0   oxyCODONE (OXY IR/ROXICODONE) 5 MG immediate release tablet Take 1 tablet (5 mg total) by mouth every 6 (six) hours as needed for severe pain. 10 tablet 0   pantoprazole (PROTONIX) 40 MG tablet Take 1 tablet (40 mg total) by mouth daily. 30 tablet 0   ranolazine (RANEXA) 500 MG 12 hr tablet Take 1 tablet (500 mg total) by mouth 2 (two) times daily. 30 tablet 0   rosuvastatin (CRESTOR) 10 MG tablet Take 4 tablets (40 mg total) by mouth daily. 30 tablet 0   senna (SENOKOT) 8.6 MG TABS tablet Take 2 tablets by mouth 2 (two) times daily.     Sodium Phosphates (RA SALINE ENEMA RE) Place 1 application rectally daily as needed. constipation     vitamin C (ASCORBIC ACID) 500 MG tablet Take 500 mg by mouth daily.     No current facility-administered medications on file prior to visit.    There are no Patient Instructions on file for this visit. No follow-ups on file.   Georgiana Spinner, NP

## 2021-04-04 ENCOUNTER — Emergency Department: Payer: Medicare Other

## 2021-04-04 ENCOUNTER — Inpatient Hospital Stay
Admission: EM | Admit: 2021-04-04 | Discharge: 2021-04-14 | DRG: 871 | Disposition: A | Discharge status: Home Health | Payer: Medicare Other | Attending: Internal Medicine | Admitting: Internal Medicine

## 2021-04-04 ENCOUNTER — Other Ambulatory Visit: Payer: Self-pay

## 2021-04-04 ENCOUNTER — Encounter: Payer: Self-pay | Admitting: Emergency Medicine

## 2021-04-04 DIAGNOSIS — Z66 Do not resuscitate: Secondary | ICD-10-CM | POA: Diagnosis present

## 2021-04-04 DIAGNOSIS — E43 Unspecified severe protein-calorie malnutrition: Secondary | ICD-10-CM | POA: Insufficient documentation

## 2021-04-04 DIAGNOSIS — Z7189 Other specified counseling: Secondary | ICD-10-CM | POA: Diagnosis not present

## 2021-04-04 DIAGNOSIS — E785 Hyperlipidemia, unspecified: Secondary | ICD-10-CM | POA: Diagnosis present

## 2021-04-04 DIAGNOSIS — Z993 Dependence on wheelchair: Secondary | ICD-10-CM

## 2021-04-04 DIAGNOSIS — R627 Adult failure to thrive: Secondary | ICD-10-CM | POA: Diagnosis present

## 2021-04-04 DIAGNOSIS — N179 Acute kidney failure, unspecified: Secondary | ICD-10-CM | POA: Diagnosis present

## 2021-04-04 DIAGNOSIS — Z7401 Bed confinement status: Secondary | ICD-10-CM

## 2021-04-04 DIAGNOSIS — F0150 Vascular dementia without behavioral disturbance: Secondary | ICD-10-CM | POA: Diagnosis present

## 2021-04-04 DIAGNOSIS — I130 Hypertensive heart and chronic kidney disease with heart failure and stage 1 through stage 4 chronic kidney disease, or unspecified chronic kidney disease: Secondary | ICD-10-CM | POA: Diagnosis present

## 2021-04-04 DIAGNOSIS — I5022 Chronic systolic (congestive) heart failure: Secondary | ICD-10-CM | POA: Diagnosis present

## 2021-04-04 DIAGNOSIS — Z952 Presence of prosthetic heart valve: Secondary | ICD-10-CM | POA: Diagnosis not present

## 2021-04-04 DIAGNOSIS — E860 Dehydration: Secondary | ICD-10-CM | POA: Diagnosis present

## 2021-04-04 DIAGNOSIS — R400 Somnolence: Secondary | ICD-10-CM

## 2021-04-04 DIAGNOSIS — G9341 Metabolic encephalopathy: Secondary | ICD-10-CM | POA: Diagnosis present

## 2021-04-04 DIAGNOSIS — A419 Sepsis, unspecified organism: Secondary | ICD-10-CM | POA: Diagnosis present

## 2021-04-04 DIAGNOSIS — I63432 Cerebral infarction due to embolism of left posterior cerebral artery: Secondary | ICD-10-CM | POA: Diagnosis not present

## 2021-04-04 DIAGNOSIS — N390 Urinary tract infection, site not specified: Secondary | ICD-10-CM

## 2021-04-04 DIAGNOSIS — I63532 Cerebral infarction due to unspecified occlusion or stenosis of left posterior cerebral artery: Secondary | ICD-10-CM | POA: Diagnosis not present

## 2021-04-04 DIAGNOSIS — K8010 Calculus of gallbladder with chronic cholecystitis without obstruction: Secondary | ICD-10-CM | POA: Diagnosis present

## 2021-04-04 DIAGNOSIS — I10 Essential (primary) hypertension: Secondary | ICD-10-CM

## 2021-04-04 DIAGNOSIS — I2510 Atherosclerotic heart disease of native coronary artery without angina pectoris: Secondary | ICD-10-CM | POA: Diagnosis present

## 2021-04-04 DIAGNOSIS — G936 Cerebral edema: Secondary | ICD-10-CM | POA: Diagnosis present

## 2021-04-04 DIAGNOSIS — K529 Noninfective gastroenteritis and colitis, unspecified: Secondary | ICD-10-CM | POA: Diagnosis present

## 2021-04-04 DIAGNOSIS — K819 Cholecystitis, unspecified: Secondary | ICD-10-CM

## 2021-04-04 DIAGNOSIS — W06XXXA Fall from bed, initial encounter: Secondary | ICD-10-CM | POA: Diagnosis present

## 2021-04-04 DIAGNOSIS — Z955 Presence of coronary angioplasty implant and graft: Secondary | ICD-10-CM

## 2021-04-04 DIAGNOSIS — E871 Hypo-osmolality and hyponatremia: Secondary | ICD-10-CM | POA: Diagnosis present

## 2021-04-04 DIAGNOSIS — Z833 Family history of diabetes mellitus: Secondary | ICD-10-CM

## 2021-04-04 DIAGNOSIS — Z6832 Body mass index (BMI) 32.0-32.9, adult: Secondary | ICD-10-CM | POA: Diagnosis not present

## 2021-04-04 DIAGNOSIS — Z7982 Long term (current) use of aspirin: Secondary | ICD-10-CM

## 2021-04-04 DIAGNOSIS — R4182 Altered mental status, unspecified: Secondary | ICD-10-CM | POA: Diagnosis present

## 2021-04-04 DIAGNOSIS — Z8249 Family history of ischemic heart disease and other diseases of the circulatory system: Secondary | ICD-10-CM

## 2021-04-04 DIAGNOSIS — Z20822 Contact with and (suspected) exposure to covid-19: Secondary | ICD-10-CM | POA: Diagnosis present

## 2021-04-04 DIAGNOSIS — E876 Hypokalemia: Secondary | ICD-10-CM | POA: Diagnosis present

## 2021-04-04 DIAGNOSIS — I714 Abdominal aortic aneurysm, without rupture, unspecified: Secondary | ICD-10-CM | POA: Diagnosis present

## 2021-04-04 DIAGNOSIS — R1310 Dysphagia, unspecified: Secondary | ICD-10-CM | POA: Diagnosis present

## 2021-04-04 DIAGNOSIS — N182 Chronic kidney disease, stage 2 (mild): Secondary | ICD-10-CM | POA: Diagnosis present

## 2021-04-04 DIAGNOSIS — R6520 Severe sepsis without septic shock: Secondary | ICD-10-CM | POA: Diagnosis present

## 2021-04-04 DIAGNOSIS — I350 Nonrheumatic aortic (valve) stenosis: Secondary | ICD-10-CM | POA: Diagnosis present

## 2021-04-04 DIAGNOSIS — R4701 Aphasia: Secondary | ICD-10-CM | POA: Diagnosis not present

## 2021-04-04 DIAGNOSIS — Z87891 Personal history of nicotine dependence: Secondary | ICD-10-CM

## 2021-04-04 DIAGNOSIS — Z8679 Personal history of other diseases of the circulatory system: Secondary | ICD-10-CM

## 2021-04-04 DIAGNOSIS — R0902 Hypoxemia: Secondary | ICD-10-CM | POA: Diagnosis present

## 2021-04-04 DIAGNOSIS — I5020 Unspecified systolic (congestive) heart failure: Secondary | ICD-10-CM | POA: Diagnosis present

## 2021-04-04 DIAGNOSIS — R0609 Other forms of dyspnea: Secondary | ICD-10-CM

## 2021-04-04 DIAGNOSIS — Z79899 Other long term (current) drug therapy: Secondary | ICD-10-CM

## 2021-04-04 DIAGNOSIS — Z7902 Long term (current) use of antithrombotics/antiplatelets: Secondary | ICD-10-CM

## 2021-04-04 DIAGNOSIS — L8990 Pressure ulcer of unspecified site, unspecified stage: Secondary | ICD-10-CM | POA: Insufficient documentation

## 2021-04-04 DIAGNOSIS — I252 Old myocardial infarction: Secondary | ICD-10-CM

## 2021-04-04 LAB — URINE DRUG SCREEN, QUALITATIVE (ARMC ONLY)
Amphetamines, Ur Screen: NOT DETECTED
Barbiturates, Ur Screen: NOT DETECTED
Benzodiazepine, Ur Scrn: NOT DETECTED
Cannabinoid 50 Ng, Ur ~~LOC~~: NOT DETECTED
Cocaine Metabolite,Ur ~~LOC~~: NOT DETECTED
MDMA (Ecstasy)Ur Screen: NOT DETECTED
Methadone Scn, Ur: NOT DETECTED
Opiate, Ur Screen: NOT DETECTED
Phencyclidine (PCP) Ur S: NOT DETECTED
Tricyclic, Ur Screen: NOT DETECTED

## 2021-04-04 LAB — COMPREHENSIVE METABOLIC PANEL
ALT: 23 U/L (ref 0–44)
AST: 29 U/L (ref 15–41)
Albumin: 2.7 g/dL — ABNORMAL LOW (ref 3.5–5.0)
Alkaline Phosphatase: 193 U/L — ABNORMAL HIGH (ref 38–126)
Anion gap: 13 (ref 5–15)
BUN: 11 mg/dL (ref 8–23)
CO2: 28 mmol/L (ref 22–32)
Calcium: 8.5 mg/dL — ABNORMAL LOW (ref 8.9–10.3)
Chloride: 89 mmol/L — ABNORMAL LOW (ref 98–111)
Creatinine, Ser: 1.11 mg/dL — ABNORMAL HIGH (ref 0.44–1.00)
GFR, Estimated: 49 mL/min — ABNORMAL LOW (ref >60)
Glucose, Bld: 130 mg/dL — ABNORMAL HIGH (ref 70–99)
Potassium: 2.5 mmol/L — CL (ref 3.5–5.1)
Sodium: 130 mmol/L — ABNORMAL LOW (ref 135–145)
Total Bilirubin: 3.5 mg/dL — ABNORMAL HIGH (ref 0.3–1.2)
Total Protein: 7.2 g/dL (ref 6.5–8.1)

## 2021-04-04 LAB — TROPONIN I (HIGH SENSITIVITY): Troponin I (High Sensitivity): 59 ng/L — ABNORMAL HIGH (ref <18)

## 2021-04-04 LAB — URINALYSIS, COMPLETE (UACMP) WITH MICROSCOPIC
Bilirubin Urine: NEGATIVE
Glucose, UA: NEGATIVE mg/dL
Ketones, ur: 5 mg/dL — AB
Nitrite: NEGATIVE
Protein, ur: 100 mg/dL — AB
Specific Gravity, Urine: 1.018 (ref 1.005–1.030)
WBC, UA: >50 WBC/hpf — ABNORMAL HIGH (ref 0–5)
pH: 6 (ref 5.0–8.0)

## 2021-04-04 LAB — CBC
HCT: 45.2 % (ref 36.0–46.0)
Hemoglobin: 15.7 g/dL — ABNORMAL HIGH (ref 12.0–15.0)
MCH: 27.7 pg (ref 26.0–34.0)
MCHC: 34.7 g/dL (ref 30.0–36.0)
MCV: 79.9 fL — ABNORMAL LOW (ref 80.0–100.0)
Platelets: 243 10*3/uL (ref 150–400)
RBC: 5.66 MIL/uL — ABNORMAL HIGH (ref 3.87–5.11)
RDW: 21.2 % — ABNORMAL HIGH (ref 11.5–15.5)
WBC: 9 10*3/uL (ref 4.0–10.5)
nRBC: 0 % (ref 0.0–0.2)

## 2021-04-04 LAB — LACTIC ACID, PLASMA
Lactic Acid, Venous: 3 mmol/L (ref 0.5–1.9)
Lactic Acid, Venous: 2.4 mmol/L (ref 0.5–1.9)

## 2021-04-04 LAB — RESP PANEL BY RT-PCR (FLU A&B, COVID) ARPGX2
Influenza A by PCR: NEGATIVE
Influenza B by PCR: NEGATIVE
SARS Coronavirus 2 by RT PCR: NEGATIVE

## 2021-04-04 LAB — MAGNESIUM: Magnesium: 1.8 mg/dL (ref 1.7–2.4)

## 2021-04-04 LAB — BRAIN NATRIURETIC PEPTIDE: B Natriuretic Peptide: 297.8 pg/mL — ABNORMAL HIGH (ref 0.0–100.0)

## 2021-04-04 MED ORDER — POTASSIUM CHLORIDE 10 MEQ/100ML IV SOLN
10.0000 meq | INTRAVENOUS | Status: AC
  Administered 2021-04-04: 10 meq via INTRAVENOUS
  Filled 2021-04-04: qty 100

## 2021-04-04 MED ORDER — SODIUM CHLORIDE 0.9 % IV SOLN
2.0000 g | Freq: Two times a day (BID) | INTRAVENOUS | Status: DC
  Administered 2021-04-04 – 2021-04-07 (×7): 2 g via INTRAVENOUS
  Filled 2021-04-04 (×10): qty 2

## 2021-04-04 MED ORDER — LACTATED RINGERS IV BOLUS
500.0000 mL | Freq: Once | INTRAVENOUS | Status: AC
  Administered 2021-04-04: 500 mL via INTRAVENOUS

## 2021-04-04 MED ORDER — MAGNESIUM SULFATE 2 GM/50ML IV SOLN
2.0000 g | Freq: Once | INTRAVENOUS | Status: AC
  Administered 2021-04-04: 2 g via INTRAVENOUS
  Filled 2021-04-04: qty 50

## 2021-04-04 MED ORDER — POTASSIUM CHLORIDE 10 MEQ/100ML IV SOLN
10.0000 meq | INTRAVENOUS | Status: AC
  Administered 2021-04-04 (×3): 10 meq via INTRAVENOUS
  Filled 2021-04-04 (×3): qty 100

## 2021-04-04 MED ORDER — SODIUM CHLORIDE 0.9 % IV SOLN
1.0000 g | Freq: Once | INTRAVENOUS | Status: AC
  Administered 2021-04-04: 1 g via INTRAVENOUS
  Filled 2021-04-04: qty 10

## 2021-04-04 MED ORDER — VANCOMYCIN HCL 2000 MG/400ML IV SOLN
2000.0000 mg | Freq: Once | INTRAVENOUS | Status: AC
  Administered 2021-04-04: 2000 mg via INTRAVENOUS
  Filled 2021-04-04: qty 400

## 2021-04-04 MED ORDER — LACTATED RINGERS IV BOLUS
1000.0000 mL | Freq: Once | INTRAVENOUS | Status: AC
  Administered 2021-04-04: 1000 mL via INTRAVENOUS

## 2021-04-04 MED ORDER — IOHEXOL 350 MG/ML SOLN
80.0000 mL | Freq: Once | INTRAVENOUS | Status: AC | PRN
  Administered 2021-04-04: 80 mL via INTRAVENOUS

## 2021-04-04 NOTE — ED Notes (Signed)
RN called lab for phlebotomy to come and attempt to stick pt for blood work.
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
## 2021-04-04 NOTE — ED Notes (Signed)
Called CT to make aware that pt is ready to go for her scan

## 2021-04-04 NOTE — ED Notes (Signed)
Care transferred, report received from Brandon, RN 

## 2021-04-04 NOTE — Progress Notes (Signed)
Pharmacy Antibiotic Note  Monica [name] is a 87 y.o. female admitted on 04/04/2021 with UTI w/ suspected sepsis.  Pharmacy has been consulted for Cefepime dosing.  Plan: Cefepime 2 gm q12h per indication and renal fxn.  Pharmacy will continue to follow and adjust dose when warranted.  Height: 5\' 5"  (165.1 cm) Weight: 93.7 kg (206 lb 9.1 oz) IBW/kg (Calculated) : 57  Temp (24hrs), Avg:98.5 F (36.9 C), Min:98.5 F (36.9 C), Max:98.5 F (36.9 C)  Recent Labs  Lab 04/04/21 1422 04/04/21 1735  WBC 9.0  --   CREATININE 1.11*  --   LATICACIDVEN  --  3.0*    Estimated Creatinine Clearance: 41.9 mL/min (A) (by C-G formula based on SCr of 1.11 mg/dL (H)).    No Known Allergies  Antimicrobials this admission: 8/24 Ceftriaxone >> x 1 8/24 Vancomycin >> x 1 8/25 Cefepime >>   Microbiology results: 8/24 BCx: Pending  Thank you for allowing pharmacy to be a part of this patient's care.  9/24, PharmD, [facility] 04/04/2021 10:25 PM

## 2021-04-04 NOTE — ED Notes (Signed)
Patient return from CT

## 2021-04-04 NOTE — ED Notes (Signed)
IV team and phlebotomy at pt bedside.

## 2021-04-04 NOTE — ED Notes (Signed)
Critical lab value of lactic acid 3.0 RN notified Dr Sidney Ace

## 2021-04-04 NOTE — ED Notes (Signed)
Patient transported to CT 

## 2021-04-04 NOTE — H&P (Signed)
History and Physical   Monica [name] JEH:[id number] DOB: 08/03/1934 DOA: 04/04/2021  Referring MD/NP/PA: Dr Augusto Gamble, MD  PCP: Pcp, No   Outpatient Specialists: Dr. Signa Kell  Patient coming from: Home  Chief Complaint: Altered mental status  HPI: Monica [name] is a 87 y.o. female with medical history significant of dementia, coronary artery disease with reported EF of 30 to 35%,, essential hypertension, History of AAA status post endovascular repair, history of GI bleed, hyperlipidemia, morbid obesity, history of aortic stenosis, status post intramedullary nail and multiple cardiac cath who also follows up with Dr. Kirke Corin for her cardiology and aggressive [facility] clinic brought in today due to altered mental status.  Patient usually is awake and communicating although bedbound.  She is completely obtunded at the moment.  Not arousable only moaning.  There was concern about possible UTI.  Also patient was reportedly found on the floor having fallen out of bed..  She appears to be tender in the right upper quadrant on initial evaluation suggestive of possible gallbladder disease.  Patient's son Monica [name] is primary historian.  At this point patient is suspected to have severe sepsis with hypoxia, possible cholelithiasis and UTI..  ED Course: Temperature 98.5 blood pressure 142/106, pulse 115 respiratory 25 oxygen sat 89% on room air.  White count is 9.0 hemoglobin 15.7 platelets 243.  Sodium is 130 potassium 2.5 chloride 89 CO2 28 BUN 11 creatinine 1.1 and calcium 8.5.  Initial troponin is 59.  Lactic acid 3.0.  COVID and influenza screen negative.  Urinalysis showed amber urine.  Large leukocytes.  WBC more than 50.  Some bacteria.  CT chest and abdomen is suggestive of mild colitis.  Abdominal ultrasound showed cholelithiasis without complicating factors.  Chest x-ray showed chronic interstitial thickening possible interstitial lung disease.  Patient therefore being admitted to the hospital for  further evaluation and treatment.  Review of Systems: As per HPI otherwise 10 point review of systems negative.    Past Medical History:  Diagnosis Date   Aortic stenosis    a. LHC 06/02/17: At least moderate aortic stenosis with a peak to peak gradient of 22 mmHg; b. TTE 05/2017: EF 55-60%, mild HK basal-midinferior wall, Gr1DD, mod to sev AS w/ mean gradient 21 mmHg, valve area 0.99, mild MR, mildly dilated LA    CAD (coronary artery disease) 2018   a. inferior STEMI 06/02/2017: LHC 06/02/17: LM 20, D1 20%, o-pLCx 50, p-mRCA 100% s/p PCI/DES, mRCA 50, dRCA 30   GI bleed    a. noted 06/05/2017   HLD (hyperlipidemia)    Obesity     Past Surgical History:  Procedure Laterality Date   ANKLE RECONSTRUCTION  1956   also ORIF of right arm   CORONARY STENT INTERVENTION N/A 06/01/2017   Procedure: Coronary/Graft Acute MI Revascularization;  Surgeon: Iran Ouch, MD;  Location: [facility];  Service: Cardiovascular;  Laterality: N/A;   ENDOVASCULAR REPAIR/STENT GRAFT N/A 01/24/2021   Procedure: ENDOVASCULAR REPAIR/STENT GRAFT;  Surgeon: Renford Dills, MD;  Location: [facility];  Service: Cardiovascular;  Laterality: N/A;   INCISION AND DRAINAGE HIP Left 01/16/2021   Procedure: IRRIGATION AND DEBRIDEMENT HIP;  Surgeon: Signa Kell, MD;  Location: [facility] OR[facility];  Service: Orthopedics;  Laterality: Left;   INTRAMEDULLARY (IM) NAIL INTERTROCHANTERIC Left 10/03/2020   Procedure: INTRAMEDULLARY (IM) NAIL INTERTROCHANTRIC;  Surgeon: Signa Kell, MD;  Location: [facility] OR[facility];  Service: Orthopedics;  Laterality: Left;   LEFT HEART CATH AND CORONARY ANGIOGRAPHY N/A 06/01/2017  Procedure: LEFT HEART CATH AND CORONARY ANGIOGRAPHY;  Surgeon: Iran Ouch, MD;  Location: [facility];  Service: Cardiovascular;  Laterality: N/A;   RIGHT/LEFT HEART CATH AND CORONARY ANGIOGRAPHY N/A 02/09/2021   Procedure: RIGHT/LEFT HEART CATH AND CORONARY ANGIOGRAPHY;  Surgeon: Iran Ouch, MD;  Location: [facility];  Service: Cardiovascular;  Laterality: N/A;     reports that she quit smoking about 3 years ago. Her smoking use included cigarettes. She has a 25.00 pack-year smoking history. She has never used smokeless tobacco. She reports that she does not drink alcohol and does not use drugs.  No Known Allergies  Family History  Problem Relation Age of Onset   Valvular heart disease Mother    Diabetes Mellitus II Daughter      Prior to Admission medications   Medication Sig Start Date End Date Taking? Authorizing Provider  carvedilol (COREG) 3.125 MG tablet Take 1 tablet by mouth 2 (two) times daily with a meal. 07/24/20  Yes [provider]  aspirin 81 MG EC tablet Take 81 mg by mouth daily.    [provider]  clopidogrel (PLAVIX) 75 MG tablet Take 1 tablet (75 mg total) by mouth daily. 02/10/21   Enedina Finner, MD  furosemide (LASIX) 20 MG tablet Take 20 mg by mouth daily.    [provider]  guaiFENesin-dextromethorphan (ROBITUSSIN DM) 100-10 MG/5ML syrup Take 5 mLs by mouth every 4 (four) hours as needed for cough. 02/10/21   Enedina Finner, MD  metoprolol succinate (TOPROL-XL) 25 MG 24 hr tablet Take 1 tablet (25 mg total) by mouth daily. 02/10/21   Enedina Finner, MD  naloxegol oxalate (MOVANTIK) 25 MG TABS tablet Take by mouth daily.    [provider]  ondansetron (ZOFRAN-ODT) 4 MG disintegrating tablet Take 4 mg by mouth every 6 (six) hours as needed for nausea or vomiting.    [provider]  oxyCODONE (OXY IR/ROXICODONE) 5 MG immediate release tablet Take 1-2 tablets (5-10 mg total) by mouth every 4 (four) hours as needed for moderate pain (pain score 4-6). 01/19/21   Anson Oregon, PA-C  oxyCODONE (OXY IR/ROXICODONE) 5 MG immediate release tablet Take 1 tablet (5 mg total) by mouth every 6 (six) hours as needed for severe pain. 02/10/21   Enedina Finner, MD  pantoprazole (PROTONIX) 40 MG tablet Take 1 tablet (40 mg  total) by mouth daily. 02/10/21   Enedina Finner, MD  ranolazine (RANEXA) 500 MG 12 hr tablet Take 1 tablet (500 mg total) by mouth 2 (two) times daily. 02/10/21   Enedina Finner, MD  rosuvastatin (CRESTOR) 10 MG tablet Take 4 tablets (40 mg total) by mouth daily. 02/10/21   Enedina Finner, MD  senna (SENOKOT) 8.6 MG TABS tablet Take 2 tablets by mouth 2 (two) times daily.    [provider]  Sodium Phosphates (RA SALINE ENEMA RE) Place 1 application rectally daily as needed. constipation    [provider]  vitamin C (ASCORBIC ACID) 500 MG tablet Take 500 mg by mouth daily.    [provider]    Physical Exam: Vitals:   04/04/21 2041 04/04/21 2102 04/04/21 2130 04/04/21 2200  BP: (!) 170/105 (!) 181/101 (!) 157/89 (!) 163/87  Pulse: (!) 103 (!) 105 (!) 103 100  Resp: (!) 21 (!) 21 (!) 21 20  Temp:      TempSrc:      SpO2: 98% 98% 99% 100%  Weight:      Height:  Constitutional: Completely obtunded, chronically ill looking Vitals:   04/04/21 2041 04/04/21 2102 04/04/21 2130 04/04/21 2200  BP: (!) 170/105 (!) 181/101 (!) 157/89 (!) 163/87  Pulse: (!) 103 (!) 105 (!) 103 100  Resp: (!) 21 (!) 21 (!) 21 20  Temp:      TempSrc:      SpO2: 98% 98% 99% 100%  Weight:      Height:       Eyes: PERRL, lids and conjunctivae normal ENMT: Mucous membranes are dry. Posterior pharynx clear of any exudate or lesions.Normal dentition.  Neck: normal, supple, no masses, no thyromegaly Respiratory: Coarse breath sounds bilaterally, no wheezing, no crackles. Normal respiratory effort. No accessory muscle use.  Cardiovascular: Sinus tachycardia, no murmurs / rubs / gallops. No extremity edema. 2+ pedal pulses. No carotid bruits.  Abdomen: Mildly distended, tenderness in the epigastric to right upper quadrant,, no masses palpated. No hepatosplenomegaly. Bowel sounds positive.  Musculoskeletal: no clubbing / cyanosis. No joint deformity upper and lower extremities. Good ROM, no  contractures. Normal muscle tone.  Skin: no rashes, lesions, ulcers. No induration Neurologic: CN 2-12 grossly intact. Sensation intact, DTR normal. Strength 5/5 in all 4.  Psychiatric: Completely obtunded but responds to touch,    Labs on Admission: I have personally reviewed following labs and imaging studies  CBC: Recent Labs  Lab 04/04/21 1422  WBC 9.0  HGB 15.7*  HCT 45.2  MCV 79.9*  PLT 243   Basic Metabolic Panel: Recent Labs  Lab 04/04/21 1422 04/04/21 1608  NA 130*  --   K 2.5*  --   CL 89*  --   CO2 28  --   GLUCOSE 130*  --   BUN 11  --   CREATININE 1.11*  --   CALCIUM 8.5*  --   MG  --  1.8   GFR: Estimated Creatinine Clearance: 41.9 mL/min (A) (by C-G formula based on SCr of 1.11 mg/dL (H)). Liver Function Tests: Recent Labs  Lab 04/04/21 1422  AST 29  ALT 23  ALKPHOS 193*  BILITOT 3.5*  PROT 7.2  ALBUMIN 2.7*   No results for input(s): LIPASE, AMYLASE in the last 168 hours. No results for input(s): AMMONIA in the last 168 hours. Coagulation Profile: No results for input(s): INR, PROTIME in the last 168 hours. Cardiac Enzymes: No results for input(s): CKTOTAL, CKMB, CKMBINDEX, TROPONINI in the last 168 hours. BNP (last 3 results) No results for input(s): PROBNP in the last 8760 hours. HbA1C: No results for input(s): HGBA1C in the last 72 hours. CBG: No results for input(s): GLUCAP in the last 168 hours. Lipid Profile: No results for input(s): CHOL, HDL, LDLCALC, TRIG, CHOLHDL, LDLDIRECT in the last 72 hours. Thyroid Function Tests: No results for input(s): TSH, T4TOTAL, FREET4, T3FREE, THYROIDAB in the last 72 hours. Anemia Panel: No results for input(s): VITAMINB12, FOLATE, FERRITIN, TIBC, IRON, RETICCTPCT in the last 72 hours. Urine analysis:    Component Value Date/Time   COLORURINE AMBER (A) 04/04/2021 1636   APPEARANCEUR HAZY (A) 04/04/2021 1636   LABSPEC 1.018 04/04/2021 1636   PHURINE 6.0 04/04/2021 1636   GLUCOSEU NEGATIVE  04/04/2021 1636   HGBUR SMALL (A) 04/04/2021 1636   BILIRUBINUR NEGATIVE 04/04/2021 1636   KETONESUR 5 (A) 04/04/2021 1636   PROTEINUR 100 (A) 04/04/2021 1636   NITRITE NEGATIVE 04/04/2021 1636   LEUKOCYTESUR LARGE (A) 04/04/2021 1636   Sepsis Labs: @LABRCNTIP (procalcitonin:4,lacticidven:4) ) Recent Results (from the past 240 hour(s))  Resp Panel by RT-PCR (Flu  A&B, Covid) Nasopharyngeal Swab     Status: None   Collection Time: 04/04/21  4:36 PM   Specimen: Nasopharyngeal Swab; Nasopharyngeal(NP) swabs in vial transport medium  Result Value Ref Range Status   SARS Coronavirus 2 by RT PCR NEGATIVE NEGATIVE Final    Comment: (NOTE) SARS-CoV-2 target nucleic acids are NOT DETECTED.  The SARS-CoV-2 RNA is generally detectable in upper respiratory specimens during the acute phase of infection. The lowest concentration of SARS-CoV-2 viral copies this assay can detect is 138 copies/mL. A negative result does not preclude SARS-Cov-2 infection and should not be used as the sole basis for treatment or other patient management decisions. A negative result may occur with  improper specimen collection/handling, submission of specimen other than nasopharyngeal swab, presence of viral mutation(s) within the areas targeted by this assay, and inadequate number of viral copies(<138 copies/mL). A negative result must be combined with clinical observations, patient history, and epidemiological information. The expected result is Negative.  Fact Sheet for Patients:  BloggerCourse.com  Fact Sheet for Healthcare Providers:  SeriousBroker.it  This test is no t yet approved or cleared by the [place] FDA and  has been authorized for detection and/or diagnosis of SARS-CoV-2 by FDA under an Emergency Use Authorization (EUA). This EUA will remain  in effect (meaning this test can be used) for the duration of the COVID-19 declaration under Section  564(b)(1) of the Act, 21 U.S.C.section 360bbb-3(b)(1), unless the authorization is terminated  or revoked sooner.       Influenza A by PCR NEGATIVE NEGATIVE Final   Influenza B by PCR NEGATIVE NEGATIVE Final    Comment: (NOTE) The Xpert Xpress SARS-CoV-2/FLU/RSV plus assay is intended as an aid in the diagnosis of influenza from Nasopharyngeal swab specimens and should not be used as a sole basis for treatment. Nasal washings and aspirates are unacceptable for Xpert Xpress SARS-CoV-2/FLU/RSV testing.  Fact Sheet for Patients: BloggerCourse.com  Fact Sheet for Healthcare Providers: SeriousBroker.it  This test is not yet approved or cleared by the [place] FDA and has been authorized for detection and/or diagnosis of SARS-CoV-2 by FDA under an Emergency Use Authorization (EUA). This EUA will remain in effect (meaning this test can be used) for the duration of the COVID-19 declaration under Section 564(b)(1) of the Act, 21 U.S.C. section 360bbb-3(b)(1), unless the authorization is terminated or revoked.  Performed at [facility], [address] [place], [place] [address]      Radiological Exams on Admission: CT HEAD WO CONTRAST ( )  Result Date: 04/04/2021 CLINICAL DATA:  Mental status change.  Fall this morning. EXAM: CT HEAD WITHOUT CONTRAST CT CERVICAL SPINE WITHOUT CONTRAST TECHNIQUE: Multidetector CT imaging of the head and cervical spine was performed following the standard protocol without intravenous contrast. Multiplanar CT image reconstructions of the cervical spine were also generated. COMPARISON:  None. FINDINGS: CT HEAD FINDINGS Diminished exam detail secondary to motion artifact. Brain: No evidence of acute infarction, hemorrhage, hydrocephalus, extra-axial collection or mass lesion/mass effect. There is moderate to marked diffuse low-attenuation within the subcortical and periventricular white matter  compatible with chronic microvascular disease. Remote bilateral basal ganglia lacunar infarcts. Vascular: No hyperdense vessel or unexpected calcification. Skull: Normal. Negative for fracture or focal lesion. Sinuses/Orbits: No acute finding. Other: None CT CERVICAL SPINE FINDINGS Diminished exam detail due to motion artifact. Alignment: Straightening of normal cervical lordosis. Anterolisthesis of C2 on C3 and C3 on C4 identified, likely due to chronic spondylosis. Skull base and vertebrae: The vertebral body  heights are well preserved. No signs of acute fracture or dislocation. Soft tissues and spinal canal: No prevertebral fluid or swelling. No visible canal hematoma. Disc levels: Multilevel disc space narrowing and endplate spurring is identified. This is most advanced at C3-4, C4-5 and C5-6. Upper chest: Extensive chronic lung disease identified. There is a 9 mm subpleural nodule in the posteromedial right upper lobe, image 79/10. Not significantly changed when compared with 10/05/2020. Other: None IMPRESSION: 1. Moderately diminished exam detail secondary to excessive motion artifact. Within this limitation there are no acute intracranial abnormalities. 2. Chronic small vessel ischemic change and brain atrophy. 3. Evaluation of the cervical spine is also limited due to motion artifact. No signs of acute cervical spine fracture. 4. Advanced cervical spondylosis. 5. There is a 9 mm subpleural nodule in the posteromedial right upper lobe. Unchanged when compared with 10/05/2020. Electronically Signed   By: Signa Kell M.D.   On: 04/04/2021 17:03   CT Cervical Spine Wo Contrast  Result Date: 04/04/2021 CLINICAL DATA:  Mental status change.  Fall this morning. EXAM: CT HEAD WITHOUT CONTRAST CT CERVICAL SPINE WITHOUT CONTRAST TECHNIQUE: Multidetector CT imaging of the head and cervical spine was performed following the standard protocol without intravenous contrast. Multiplanar CT image reconstructions of  the cervical spine were also generated. COMPARISON:  None. FINDINGS: CT HEAD FINDINGS Diminished exam detail secondary to motion artifact. Brain: No evidence of acute infarction, hemorrhage, hydrocephalus, extra-axial collection or mass lesion/mass effect. There is moderate to marked diffuse low-attenuation within the subcortical and periventricular white matter compatible with chronic microvascular disease. Remote bilateral basal ganglia lacunar infarcts. Vascular: No hyperdense vessel or unexpected calcification. Skull: Normal. Negative for fracture or focal lesion. Sinuses/Orbits: No acute finding. Other: None CT CERVICAL SPINE FINDINGS Diminished exam detail due to motion artifact. Alignment: Straightening of normal cervical lordosis. Anterolisthesis of C2 on C3 and C3 on C4 identified, likely due to chronic spondylosis. Skull base and vertebrae: The vertebral body heights are well preserved. No signs of acute fracture or dislocation. Soft tissues and spinal canal: No prevertebral fluid or swelling. No visible canal hematoma. Disc levels: Multilevel disc space narrowing and endplate spurring is identified. This is most advanced at C3-4, C4-5 and C5-6. Upper chest: Extensive chronic lung disease identified. There is a 9 mm subpleural nodule in the posteromedial right upper lobe, image 79/10. Not significantly changed when compared with 10/05/2020. Other: None IMPRESSION: 1. Moderately diminished exam detail secondary to excessive motion artifact. Within this limitation there are no acute intracranial abnormalities. 2. Chronic small vessel ischemic change and brain atrophy. 3. Evaluation of the cervical spine is also limited due to motion artifact. No signs of acute cervical spine fracture. 4. Advanced cervical spondylosis. 5. There is a 9 mm subpleural nodule in the posteromedial right upper lobe. Unchanged when compared with 10/05/2020. Electronically Signed   By: Signa Kell M.D.   On: 04/04/2021 17:03    CT CHEST ABDOMEN PELVIS W CONTRAST  Addendum Date: 04/04/2021   ADDENDUM REPORT: 04/04/2021 21:13 ADDENDUM: Should be noted the proximal and mid colon show decompression with some wall edema which may represent mild colitis. Electronically Signed   By: Alcide Clever M.D.   On: 04/04/2021 21:13   Result Date: 04/04/2021 CLINICAL DATA:  Chronic dyspnea and abdominal distension following recent fall, initial encounter EXAM: CT CHEST, ABDOMEN, AND PELVIS WITH CONTRAST TECHNIQUE: Multidetector CT imaging of the chest, abdomen and pelvis was performed following the standard protocol during bolus administration of intravenous contrast.  CONTRAST:  80mL OMNIPAQUE IOHEXOL 350 MG/ML SOLN COMPARISON:  01/21/2021, 01/20/2021 FINDINGS: CT CHEST FINDINGS Cardiovascular: Atherosclerotic calcifications of the thoracic aorta are noted. No aneurysmal dilatation or dissection is noted. Mitral valve calcifications are seen as well as coronary calcifications. No cardiac enlargement is seen. Pulmonary artery as visualized shows no embolus although not timed for embolus evaluation. Mediastinum/Nodes: Thoracic inlet is within normal limits. Stable appearing mediastinal and hilar lymph nodes are noted similar to that seen on prior exam from 01/20/2021. The esophagus as visualized is within normal limits. Lungs/Pleura: Subpleural fibrotic changes are again identified similar to that seen on prior CT examination. No acute infiltrate or sizable effusion is noted. No sizable parenchymal nodules are noted. Musculoskeletal: Old rib fractures are noted on the right. No definitive acute rib fractures are seen. Degenerative changes of the thoracic spine are noted. No compression deformity is seen. CT ABDOMEN PELVIS FINDINGS Hepatobiliary: Liver is diffusely fatty infiltrated. Gallbladder demonstrates multiple gallstones within. Pancreas: Unremarkable. No pancreatic ductal dilatation or surrounding inflammatory changes. Spleen: Scattered  calcified granulomas are noted within the spleen stable from prior CT. Adrenals/Urinary Tract: Adrenal glands are within normal limits. Right kidney is somewhat shrunken progressed when compared with the prior exam which may be related to the aortic stent graft placement. No renal calculi are seen. No obstructive changes are noted. Stable lesion arising from the lower pole of the left kidney is again seen likely representing complex cyst. The bladder is decompressed by Foley catheter. Stomach/Bowel: Scattered diverticular change of the colon is noted without evidence of diverticulitis. The ascending, transverse and proximal descending colons are decompressed with some mild wall edema suggestive of underlying colitis. The small bowel and stomach appear within normal limits. Vascular/Lymphatic: Abdominal aortic aneurysm is again seen. There is been interval stent graft therapy performed which is widely patent. No evidence of endoleak is noted. The maximum aneurysmal sac diameter is 7.9 cm relatively stable from the prior exam. Left renal artery stent appears patent. The right renal artery stent is not well opacified and may be occluded distally which would account for the shrunken right kidney when compared with the prior exam. Changes of prior embolization of the right internal iliac artery are seen. Stable right common iliac artery aneurysms is noted but excluded by the stent graft. Reproductive: Uterus and bilateral adnexa are unremarkable. Other: No abdominal wall hernia or abnormality. No abdominopelvic ascites. Musculoskeletal: Postsurgical changes in the proximal left femur are seen. Degenerative changes of lumbar spine are seen. No compression deformity is noted. IMPRESSION: CT of the chest: Stable mediastinal and hilar lymph nodes when compare with the prior exam. 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 CT of the abdomen and pelvis: Fatty infiltration of the liver. Interval exclusion of  the abdominal aortic aneurysm by stent graft therapy with embolization of the right internal iliac artery and stenting of both renal arteries. The right renal artery stent does not appear to opacify satisfactorily and would account for the right renal atrophy when compared with the prior exam. Aneurysm sac is stable from the prior exam without endoleak. Diverticulosis without diverticulitis. Stable cystic lesion arising from the lower pole of the left kidney. Nonemergent MRI is again recommended for further evaluation as previously described. Stable cholelithiasis. Electronically Signed: By: Alcide Clever M.D. On: 04/04/2021 20:50   DG Chest Portable 1 View  Result Date: 04/04/2021 CLINICAL DATA:  Hypoxia.  Altered mental status. EXAM: PORTABLE CHEST 1 VIEW COMPARISON:  Radiograph 01/24/2021 chest CT 01/20/2009  FINDINGS: Stable borderline cardiomegaly. Aortic atherosclerosis and tortuosity. Chronic interstitial thickening in a peripheral predominant distribution. No superimposed acute airspace disease. No pleural effusion or pneumothorax. Abdominal aortic stent graft is partially visualized. No acute osseous abnormalities are seen. IMPRESSION: Chronic interstitial thickening, suspicious for interstitial lung disease. No superimposed acute process. Electronically Signed   By: Narda Rutherford M.D.   On: 04/04/2021 17:01   US Abdomen Limited RUQ (LIVER/GB)  Result Date: 04/04/2021 CLINICAL DATA:  History of cholelithiasis EXAM: ULTRASOUND ABDOMEN LIMITED RIGHT UPPER QUADRANT COMPARISON:  CT from earlier in the same day. FINDINGS: Gallbladder: Gallbladder is well distended with evidence of cholelithiasis. No wall thickening or pericholecystic fluid is noted. Negative sonographic Murphy's sign is elicited. Common bile duct: Diameter: 3.4 mm. Liver: Fatty infiltration of the liver is noted. No focal mass is seen. Portal vein is patent on color Doppler imaging with normal direction of blood flow towards the liver.  Other: None. IMPRESSION: Cholelithiasis without complicating factors. Fatty liver similar to that seen on CT. Electronically Signed   By: Alcide Clever M.D.   On: 04/04/2021 21:43      Assessment/Plan Principal Problem:   Severe sepsis (HCC) Active Problems:   CAD (coronary artery disease)   Severe aortic stenosis   Abdominal aortic aneurysm (AAA) without rupture (HCC)   HFrEF (heart failure with reduced ejection fraction) (HCC)   Hypokalemia   Hyponatremia   Acute metabolic encephalopathy     #1 severe sepsis: Most likely secondary to UTI and intra-abdominal infections.  Patient is currently completely obtunded.  We will admit to stepdown unit.  Initiate blood cultures and urine cultures.  She has been initiated on cefepime which we will switch to Rocephin and Flagyl to cover both the UTI and intra-abdominal infections.  We will monitor patient closely and follow.  #2 acute metabolic encephalopathy: Most likely due to acute infection.  Continue treatment as above and monitor.  #3 mild dementia: At this point she is completely obtunded so unable to assess patient.  #4 severe aortic stenosis: Patient being followed for possible TAVR.  Defer.  #5 coronary artery disease: Patient has stents.  EF of 30 to 35%.  We will be careful with fluid resuscitation.  Also continue to monitor on telemetry and cycle enzymes.  #6 severe hypokalemia: Replete potassium and monitor  #7 possible colitis: Treat as per #1  #8 UTI: Treat as per #1  #9 hyponatremia: Probably dehydration.  Gentle hydration monitor.  #10 AKI: Probably prerenal.  Hydrate and monitor   DVT prophylaxis: Heparin Code Status: DNR Family Communication: Son Monica [name] over the phone Disposition Plan: To be determined Consults called: None Admission status: Inpatient  Severity of Illness: The appropriate patient status for this patient is INPATIENT. Inpatient status is judged to be reasonable and necessary in order to provide  the required intensity of service to ensure the patient's safety. The patient's presenting symptoms, physical exam findings, and initial radiographic and laboratory data in the context of their chronic comorbidities is felt to place them at high risk for further clinical deterioration. Furthermore, it is not anticipated that the patient will be medically stable for discharge from the hospital within 2 midnights of admission. The following factors support the patient status of inpatient.   " The patient's presenting symptoms include altered mental status. " The worrisome physical exam findings include complete obtundation. " The initial radiographic and laboratory data are worrisome because of meets sepsis criteria with hypoxia. " The chronic co-morbidities include coronary artery  disease.   * I certify that at the point of admission it is my clinical judgment that the patient will require inpatient hospital care spanning beyond 2 midnights from the point of admission due to high intensity of service, high risk for further deterioration and high frequency of surveillance required.Lonia Blood MD [facility] Pager [phone]  If 7PM-7AM, please contact night-coverage [email or web address] Password [facility]  04/04/2021, 10:08 PM

## 2021-04-04 NOTE — ED Notes (Signed)
Ultrasound at the bedside. Messaged pharmacy for Vancomycin

## 2021-04-04 NOTE — ED Notes (Signed)
Three attempts have been tried on pt to get IV placement with no success.

## 2021-04-04 NOTE — ED Provider Notes (Signed)
[facility]  ____________________________________________   Event Date/Time   First MD Initiated Contact with Patient 04/04/21 1551     (approximate)  I have reviewed the triage vital signs and the nursing notes.   HISTORY  Chief Complaint Altered Mental Status and Fall    HPI Monica [name] is a 87 y.o. female pmh NSTEMI, severe aortic stenosis, AAA status post endovascular repair, history of GI bleed CHF with EF of 30 to 35% who presents with altered mental status.  I am unable to obtain any history from the patient given her altered mental status.  Per the patient's son, Vicente Males, the patient was unable to get out of bed today and then was very disoriented and poorly responsive which is not her baseline.  He notes that at baseline she is bedbound but is usually alert and oriented.  Does have some occasional confusion.  When he went to check on her he found her on the floor having fallen out of bed.  He denies any other acute symptoms leading up to this.         Past Medical History:  Diagnosis Date   Aortic stenosis    a. LHC 06/02/17: At least moderate aortic stenosis with a peak to peak gradient of 22 mmHg; b. TTE 05/2017: EF 55-60%, mild HK basal-midinferior wall, Gr1DD, mod to sev AS w/ mean gradient 21 mmHg, valve area 0.99, mild MR, mildly dilated LA    CAD (coronary artery disease) 2018   a. inferior STEMI 06/02/2017: LHC 06/02/17: LM 20, D1 20%, o-pLCx 60, p-mRCA 100% s/p PCI/DES, mRCA 50, dRCA 30   GI bleed    a. noted 06/05/2017   HLD (hyperlipidemia)    Obesity     Patient Active Problem List   Diagnosis Date Noted   Severe sepsis ([place]) 04/04/2021   Hypokalemia 04/04/2021   Hyponatremia 19/41/7408   Acute metabolic encephalopathy 14/48/1856   Constipation due to slow transit 03/14/2021   HFrEF (heart failure with reduced ejection fraction) (HCC)    Non-ST elevation (NSTEMI) myocardial infarction ([place])    Hypoxia    Shortness of breath     Pre-op evaluation    Abdominal aortic aneurysm (AAA) without rupture (HCC)    Obesity, Class III, BMI 40-49.9 (morbid obesity) ([place])    Abscess of left hip 01/15/2021   Acute respiratory failure with hypoxia ([place]) 10/05/2020   Severe aortic stenosis    Closed comminuted intertrochanteric fracture of proximal end of left femur ([place]) 10/02/2020   Aortic stenosis    Obesity    Cellulitis of left lower extremity 04/29/2018   GI bleed 06/05/2017   Nausea vomiting and diarrhea    Acute ST elevation myocardial infarction (STEMI) of inferior wall ([place]) 06/02/2017   CAD (coronary artery disease) 2018    Past Surgical History:  Procedure Laterality Date   ANKLE RECONSTRUCTION  1956   also ORIF of right arm   CORONARY STENT INTERVENTION N/A 06/01/2017   Procedure: Coronary/Graft Acute MI Revascularization;  Surgeon: Wellington Hampshire, MD;  Location: [place] [facility];  Service: Cardiovascular;  Laterality: N/A;   ENDOVASCULAR REPAIR/STENT GRAFT N/A 01/24/2021   Procedure: ENDOVASCULAR REPAIR/STENT GRAFT;  Surgeon: Katha Cabal, MD;  Location: [place] [facility];  Service: Cardiovascular;  Laterality: N/A;   INCISION AND DRAINAGE HIP Left 01/16/2021   Procedure: IRRIGATION AND DEBRIDEMENT HIP;  Surgeon: Leim Fabry, MD;  Location: [facility];  Service: Orthopedics;  Laterality: Left;   INTRAMEDULLARY (IM) NAIL  INTERTROCHANTERIC Left 10/03/2020   Procedure: INTRAMEDULLARY (IM) NAIL INTERTROCHANTRIC;  Surgeon: Leim Fabry, MD;  Location: [facility];  Service: Orthopedics;  Laterality: Left;   LEFT HEART CATH AND CORONARY ANGIOGRAPHY N/A 06/01/2017   Procedure: LEFT HEART CATH AND CORONARY ANGIOGRAPHY;  Surgeon: Wellington Hampshire, MD;  Location: [place] [facility];  Service: Cardiovascular;  Laterality: N/A;   RIGHT/LEFT HEART CATH AND CORONARY ANGIOGRAPHY N/A 02/09/2021   Procedure: RIGHT/LEFT HEART CATH AND CORONARY ANGIOGRAPHY;  Surgeon: Wellington Hampshire, MD;  Location: [place] [facility];  Service: Cardiovascular;  Laterality: N/A;    Prior to Admission medications   Medication Sig Start Date End Date Taking? Authorizing Provider  carvedilol (COREG) 3.125 MG tablet Take 1 tablet by mouth 2 (two) times daily with a meal. 07/24/20  Yes [provider]  aspirin 81 MG EC tablet Take 81 mg by mouth daily.    [provider]  clopidogrel (PLAVIX) 75 MG tablet Take 1 tablet (75 mg total) by mouth daily. 02/10/21   Fritzi Mandes, MD  furosemide (LASIX) 20 MG tablet Take 20 mg by mouth daily.    [provider]  guaiFENesin-dextromethorphan (ROBITUSSIN DM) 100-10 MG/5ML syrup Take 5 mLs by mouth every 4 (four) hours as needed for cough. 02/10/21   Fritzi Mandes, MD  metoprolol succinate (TOPROL-XL) 25 MG 24 hr tablet Take 1 tablet (25 mg total) by mouth daily. 02/10/21   Fritzi Mandes, MD  naloxegol oxalate (MOVANTIK) 25 MG TABS tablet Take by mouth daily.    [provider]  ondansetron (ZOFRAN-ODT) 4 MG disintegrating tablet Take 4 mg by mouth every 6 (six) hours as needed for nausea or vomiting.    [provider]  oxyCODONE (OXY IR/ROXICODONE) 5 MG immediate release tablet Take 1-2 tablets (5-10 mg total) by mouth every 4 (four) hours as needed for moderate pain (pain score 4-6). 01/19/21   Lattie Corns, PA-C  oxyCODONE (OXY IR/ROXICODONE) 5 MG immediate release tablet Take 1 tablet (5 mg total) by mouth every 6 (six) hours as needed for severe pain. 02/10/21   Fritzi Mandes, MD  pantoprazole (PROTONIX) 40 MG tablet Take 1 tablet (40 mg total) by mouth daily. 02/10/21   Fritzi Mandes, MD  ranolazine (RANEXA) 500 MG 12 hr tablet Take 1 tablet (500 mg total) by mouth 2 (two) times daily. 02/10/21   Fritzi Mandes, MD  rosuvastatin (CRESTOR) 10 MG tablet Take 4 tablets (40 mg total) by mouth daily. 02/10/21   Fritzi Mandes, MD  senna (SENOKOT) 8.6 MG TABS tablet Take 2 tablets by mouth 2 (two) times daily.    [provider]  Sodium Phosphates (RA SALINE  ENEMA RE) Place 1 application rectally daily as needed. constipation    [provider]  vitamin C (ASCORBIC ACID) 500 MG tablet Take 500 mg by mouth daily.    [provider]    Allergies Patient has no known allergies.  Family History  Problem Relation Age of Onset   Valvular heart disease Mother    Diabetes Mellitus II Daughter     Social History Social History   Tobacco Use   Smoking status: Former    Packs/day: 0.50    Years: 50.00    Pack years: 25.00    Types: Cigarettes    Quit date: 06/01/2017    Years since quitting: 3.8   Smokeless tobacco: Never  Vaping Use   Vaping Use: Never used  Substance Use Topics   Alcohol use: No  Drug use: No    Review of Systems   Review of Systems  Unable to perform ROS: Mental status change   Physical Exam Updated Vital Signs BP (!) 158/90 (BP Location: Right Arm)   Pulse (!) 108   Temp 98.5 F (36.9 C) (Oral)   Resp 19   Ht 5' 5"  (1.651 m)   Wt 93.7 kg   SpO2 94%   BMI 34.38 kg/m   Physical Exam Vitals and nursing note reviewed.  Constitutional:      Comments: Patient is ill-appearing Emesis on her shirt  HENT:     Head: Normocephalic and atraumatic.     Nose: Nose normal.     Mouth/Throat:     Mouth: Mucous membranes are dry.  Eyes:     Conjunctiva/sclera: Conjunctivae normal.     Pupils: Pupils are equal, round, and reactive to light.  Cardiovascular:     Rate and Rhythm: Regular rhythm. Tachycardia present.  Pulmonary:     Effort: Pulmonary effort is normal. No respiratory distress.  Abdominal:     Palpations: Abdomen is soft.     Tenderness: There is abdominal tenderness.     Comments: Patient groans to palpation of the abdomen diffusely  Genitourinary:    Comments: Intertrigo in the groin area Musculoskeletal:        General: No swelling or tenderness.     Cervical back: Normal range of motion. No rigidity.  Skin:    General: Skin is warm and dry.     Capillary Refill:  Capillary refill takes less than 2 seconds.  Neurological:     Comments: Patient has eyes open, no purposeful verbal response Patient groans and withdraws to pain in all 4 extremities Does not follow commands  Psychiatric:     Comments: Unable to assess     LABS (all labs ordered are listed, but only abnormal results are displayed)  Labs Reviewed  COMPREHENSIVE METABOLIC PANEL - Abnormal; Notable for the following components:      Result Value   Sodium 130 (*)    Potassium 2.5 (*)    Chloride 89 (*)    Glucose, Bld 130 (*)    Creatinine, Ser 1.11 (*)    Calcium 8.5 (*)    Albumin 2.7 (*)    Alkaline Phosphatase 193 (*)    Total Bilirubin 3.5 (*)    GFR, Estimated 49 (*)    All other components within normal limits  CBC - Abnormal; Notable for the following components:   RBC 5.66 (*)    Hemoglobin 15.7 (*)    MCV 79.9 (*)    RDW 21.2 (*)    All other components within normal limits  BRAIN NATRIURETIC PEPTIDE - Abnormal; Notable for the following components:   B Natriuretic Peptide 297.8 (*)    All other components within normal limits  LACTIC ACID, PLASMA - Abnormal; Notable for the following components:   Lactic Acid, Venous 3.0 (*)    All other components within normal limits  LACTIC ACID, PLASMA - Abnormal; Notable for the following components:   Lactic Acid, Venous 2.4 (*)    All other components within normal limits  URINALYSIS, COMPLETE (UACMP) WITH MICROSCOPIC - Abnormal; Notable for the following components:   Color, Urine AMBER (*)    APPearance HAZY (*)    Hgb urine dipstick SMALL (*)    Ketones, ur 5 (*)    Protein, ur 100 (*)    Leukocytes,Ua LARGE (*)    WBC, UA >  50 (*)    Bacteria, UA RARE (*)    All other components within normal limits  TROPONIN I (HIGH SENSITIVITY) - Abnormal; Notable for the following components:   Troponin I (High Sensitivity) 59 (*)    All other components within normal limits  RESP PANEL BY RT-PCR (FLU A&B, COVID) ARPGX2   CULTURE, BLOOD (ROUTINE X 2)  CULTURE, BLOOD (ROUTINE X 2)  MAGNESIUM  URINE DRUG SCREEN, QUALITATIVE ([facility] ONLY)  CBG MONITORING, ED   ____________________________________________  EKG  Normal sinus rhythm, normal intervals, normal axis, T wave flattening in the inferior leads ____________________________________________  RADIOLOGY I, Madelin Headings, personally viewed and evaluated these images (plain radiographs) as part of my medical decision making, as well as reviewing the written report by the radiologist.  ED MD interpretation: I reviewed the chest x-ray which shows some increased interstitial markings bilaterally    ____________________________________________   PROCEDURES  Procedure(s) performed (including Critical Care):  Procedures   ____________________________________________   INITIAL IMPRESSION / ASSESSMENT AND PLAN / ED COURSE     This is an 87 year old female with significant cardiac history who presents with an acute change in mental status.  Also had a fall at home.  Vital signs are notable for tachycardia and hypoxia.  On exam she is significantly altered, she with draws to pain but does not follow any commands nor does she have a purposeful verbal response.  In talking to her son this is a significant change from her baseline.  Will work-up broadly with CT imaging and labs.  CT head and C-spine are somewhat motion degraded but did not show any acute intracranial pathology.  Her chest x-ray looks wet to me.  She does have a history of CHF and her BNP is elevated in the 200s.  She is also hypoxic so I question whether she has some pulmonary edema.  We have her lactate is also elevated to 3 and she has a UA concerning for UTI.  We will give a liter and half of fluids and reassess.  Do not want to give a full 30 cc/kg given the potential that she already has some pulmonary edema.  Her BMP is notable for a potassium of 2.5, QT is also somewhat prolonged at  494.  Will replete with 40 of IV K and 2 g of mag.  CT abdomen pelvis does not show any acute findings, she has no evidence of endoleak.  She does have multiple gallstones without evidence of cholecystitis on CT but with her elevated alk phos and bilirubin I will get a right upper quadrant ultrasound.  Patient was given ceftriaxone and vancomycin, covered more broadly given her degree of illness.  Will be admitted to the medicine service.      ____________________________________________   FINAL CLINICAL IMPRESSION(S) / ED DIAGNOSES  Final diagnoses:  Cholecystitis     ED Discharge Orders     None        Note:  This document was prepared using Dragon voice recognition software and may include unintentional dictation errors.    Rada Hay, MD 04/05/21 [phone]

## 2021-04-04 NOTE — ED Notes (Signed)
Hospitalist at the bedside 

## 2021-04-04 NOTE — ED Notes (Signed)
Patient transported to CT via stretcher.

## 2021-04-05 DIAGNOSIS — N390 Urinary tract infection, site not specified: Secondary | ICD-10-CM

## 2021-04-05 LAB — CBC
HCT: 40.9 % (ref 36.0–46.0)
Hemoglobin: 14.2 g/dL (ref 12.0–15.0)
MCH: 28.1 pg (ref 26.0–34.0)
MCHC: 34.7 g/dL (ref 30.0–36.0)
MCV: 81 fL (ref 80.0–100.0)
Platelets: 221 10*3/uL (ref 150–400)
RBC: 5.05 MIL/uL (ref 3.87–5.11)
RDW: 21.7 % — ABNORMAL HIGH (ref 11.5–15.5)
WBC: 21.1 10*3/uL — ABNORMAL HIGH (ref 4.0–10.5)
nRBC: 0 % (ref 0.0–0.2)

## 2021-04-05 LAB — COMPREHENSIVE METABOLIC PANEL
ALT: 17 U/L (ref 0–44)
AST: 25 U/L (ref 15–41)
Albumin: 2.2 g/dL — ABNORMAL LOW (ref 3.5–5.0)
Alkaline Phosphatase: 148 U/L — ABNORMAL HIGH (ref 38–126)
Anion gap: 11 (ref 5–15)
BUN: 11 mg/dL (ref 8–23)
CO2: 27 mmol/L (ref 22–32)
Calcium: 8.1 mg/dL — ABNORMAL LOW (ref 8.9–10.3)
Chloride: 94 mmol/L — ABNORMAL LOW (ref 98–111)
Creatinine, Ser: 1.06 mg/dL — ABNORMAL HIGH (ref 0.44–1.00)
GFR, Estimated: 51 mL/min — ABNORMAL LOW (ref >60)
Glucose, Bld: 160 mg/dL — ABNORMAL HIGH (ref 70–99)
Potassium: 2.5 mmol/L — CL (ref 3.5–5.1)
Sodium: 132 mmol/L — ABNORMAL LOW (ref 135–145)
Total Bilirubin: 2.9 mg/dL — ABNORMAL HIGH (ref 0.3–1.2)
Total Protein: 6.2 g/dL — ABNORMAL LOW (ref 6.5–8.1)

## 2021-04-05 LAB — PROTIME-INR
INR: 1.2 (ref 0.8–1.2)
Prothrombin Time: 15.3 seconds — ABNORMAL HIGH (ref 11.4–15.2)

## 2021-04-05 LAB — POTASSIUM: Potassium: 3.1 mmol/L — ABNORMAL LOW (ref 3.5–5.1)

## 2021-04-05 LAB — GLUCOSE, CAPILLARY: Glucose-Capillary: 110 mg/dL — ABNORMAL HIGH (ref 70–99)

## 2021-04-05 LAB — CORTISOL-AM, BLOOD: Cortisol - AM: 44.7 ug/dL — ABNORMAL HIGH (ref 6.7–22.6)

## 2021-04-05 LAB — PROCALCITONIN: Procalcitonin: 0.51 ng/mL

## 2021-04-05 LAB — MAGNESIUM: Magnesium: 2 mg/dL (ref 1.7–2.4)

## 2021-04-05 MED ORDER — ONDANSETRON HCL 4 MG/2ML IJ SOLN
4.0000 mg | Freq: Four times a day (QID) | INTRAMUSCULAR | Status: DC | PRN
  Administered 2021-04-10: 15:00:00 4 mg via INTRAVENOUS
  Filled 2021-04-05: qty 2

## 2021-04-05 MED ORDER — KCL IN DEXTROSE-NACL 20-5-0.9 MEQ/L-%-% IV SOLN
INTRAVENOUS | Status: DC
  Filled 2021-04-05 (×11): qty 1000

## 2021-04-05 MED ORDER — HEPARIN SODIUM (PORCINE) 5000 UNIT/ML IJ SOLN
5000.0000 [IU] | Freq: Three times a day (TID) | INTRAMUSCULAR | Status: DC
  Administered 2021-04-05 – 2021-04-12 (×21): 5000 [IU] via SUBCUTANEOUS
  Filled 2021-04-05 (×21): qty 1

## 2021-04-05 MED ORDER — SODIUM CHLORIDE 0.9 % IV SOLN: 2.0000 g | Freq: Once | INTRAVENOUS | Status: DC

## 2021-04-05 MED ORDER — ONDANSETRON HCL 4 MG PO TABS: 4.0000 mg | ORAL_TABLET | Freq: Four times a day (QID) | ORAL | Status: DC | PRN

## 2021-04-05 MED ORDER — CHLORHEXIDINE GLUCONATE 0.12 % MT SOLN
15.0000 mL | Freq: Two times a day (BID) | OROMUCOSAL | Status: DC
  Administered 2021-04-05 – 2021-04-14 (×18): 15 mL via OROMUCOSAL
  Filled 2021-04-05 (×18): qty 15

## 2021-04-05 MED ORDER — POTASSIUM CHLORIDE 10 MEQ/100ML IV SOLN
10.0000 meq | INTRAVENOUS | Status: AC
  Administered 2021-04-05 (×2): 10 meq via INTRAVENOUS
  Filled 2021-04-05 (×2): qty 100

## 2021-04-05 MED ORDER — ORAL CARE MOUTH RINSE
15.0000 mL | Freq: Two times a day (BID) | OROMUCOSAL | Status: DC
  Administered 2021-04-05 – 2021-04-13 (×14): 15 mL via OROMUCOSAL

## 2021-04-05 MED ORDER — CHLORHEXIDINE GLUCONATE CLOTH 2 % EX PADS
6.0000 | MEDICATED_PAD | Freq: Every day | CUTANEOUS | Status: AC
  Administered 2021-04-06 – 2021-04-10 (×4): 6 via TOPICAL

## 2021-04-05 MED ORDER — POTASSIUM CHLORIDE 10 MEQ/100ML IV SOLN
10.0000 meq | INTRAVENOUS | Status: AC
  Filled 2021-04-05: qty 100

## 2021-04-05 MED ORDER — DEXTROSE IN LACTATED RINGERS 5 % IV SOLN: INTRAVENOUS | Status: DC

## 2021-04-05 MED ORDER — CHLORHEXIDINE GLUCONATE CLOTH 2 % EX PADS
6.0000 | MEDICATED_PAD | Freq: Every day | CUTANEOUS | Status: DC
  Administered 2021-04-05 – 2021-04-14 (×10): 6 via TOPICAL

## 2021-04-05 MED ORDER — POTASSIUM CHLORIDE 10 MEQ/100ML IV SOLN
10.0000 meq | INTRAVENOUS | Status: AC
  Administered 2021-04-05 (×3): 10 meq via INTRAVENOUS
  Filled 2021-04-05 (×3): qty 100

## 2021-04-05 MED ORDER — MUPIROCIN 2 % EX OINT
1.0000 "application " | TOPICAL_OINTMENT | Freq: Two times a day (BID) | CUTANEOUS | Status: AC
  Administered 2021-04-05 – 2021-04-10 (×10): 1 via NASAL
  Filled 2021-04-05 (×2): qty 22

## 2021-04-05 NOTE — Progress Notes (Signed)
Pt responds to pain with moans and groans, non-verbal at this time. Admission incomplete due to AMS. Unable to follow commands. VSS.

## 2021-04-05 NOTE — ED Notes (Signed)
Patient with loose brown stool, patient perineum is noted to be very excoriated. Peri care given and barrier cream applied, foley care given.  Patient continues with moaning and not answering any questions.  She does reach for her peri area when being cleaned and moans.  Bed in low and locked position side rails up x2 on ER stretcher for safety, call bell in reach.

## 2021-04-05 NOTE — ED Notes (Signed)
Informed RN bed assigned 

## 2021-04-05 NOTE — Progress Notes (Signed)
PROGRESS NOTE    Monica [name]  OZH:[id number] DOB: 1934-02-20 DOA: 04/04/2021 PCP: Pcp, No   Chief complaint.  Altered mental status. Brief Narrative:  Monica [name] is a 87 y.o. female with medical history significant of dementia, coronary artery disease with reported EF of 30 to 35%,, essential hypertension, History of AAA status post endovascular repair, history of GI bleed, hyperlipidemia, morbid obesity, history of aortic stenosis, status post intramedullary nail and multiple cardiac cath brought in today due to altered mental status.  Upon arriving the hospital, patient had abnormal urine, suggesting UTI.  CT chest/abdomen/pelvis suggestive of mild colitis.  Upper quadrant ultrasound did not show any evidence of cholecystitis.  Patient met severe sepsis criteria with tachycardia, tachypnea, significant leukocytosis, elevated lactic acid of 3.0, profound mental status changes, elevated procalcitonin level. Per patient daughter, she just nursing home about 5 days ago, symptoms started 2 days ago with weakness and confusion.  She also had 2 days of diarrhea. She is placed on cefepime for urinary tract infection while urine culture and blood culture sent out   Assessment & Plan:   Principal Problem:   Severe sepsis ([place]) Active Problems:   CAD (coronary artery disease)   Severe aortic stenosis   Abdominal aortic aneurysm (AAA) without rupture (HCC)   HFrEF (heart failure with reduced ejection fraction) (HCC)   Hypokalemia   Hyponatremia   Acute metabolic encephalopathy  #1.  Severe sepsis.  Secondary to UTI. Urinary tract infection. Acute metabolic encephalopathy secondary to UTI. Mild dementia. Discussed with the daughter, patient has some mild dementia, but does not have significant confusion at baseline.  Symptoms started 2 days ago. I reviewed her CT scan and ultrasound, her condition is more likely due to UTI. Await culture results from urine and bladder. Continue cefepime  for now.  Adjust antibiotics when culture results available. Patient mental status appears to be improving this morning, she opens eyes, responds to verbal commands.  But still not talking. Reviewed her CT head, no acute changes. Her mental status changes most likely due to UTI.  I will continue to follow. I will start a soft diet for now.  2.  Severe hypokalemia Hyponatremia. Patient had a 2 days of diarrhea.  At this point, patient has severe hypokalemia.  I discussed with the RN this morning, she was ordered for 60 mEq of potassium last night, she only received 10 mEq.  The rest of the orders are expired.  I ordered another 50 mEq potassium this morning, present and told the nurse to not discontinue it. In the meantime, I also changed IV fluids to normal saline with D5 and 68mq of potassium.  Magnesium level was normal.  3.  Colitis. Continue antibiotics as above.  4.  Acute kidney injury Renal function improving.  #5.  Severe aortic stenosis status post TV TAVR. Coronary artery disease. Conditions are stable.    DVT prophylaxis: Heparin Code Status: DNR Family Communication: Daughter updated on the phone. Disposition Plan:    Status is: Inpatient  Remains inpatient appropriate because:Altered mental status, IV treatments appropriate due to intensity of illness or inability to take PO, and Inpatient level of care appropriate due to severity of illness  Dispo: The patient is from: Home              Anticipated d/c is to: SNF              Patient currently is not medically stable to d/c.   Difficult to place patient  No        I/O last 3 completed shifts: In: 3871.6 [IV Piggyback:3871.6] Out: 1500 [Urine:1500] No intake/output data recorded.     Consultants:  None  Procedures: None  Antimicrobials: Cefepime   Subjective: Patient still is very confused, but opening eyes, follow commands. He does not have fever chills No abdominal pain or nausea  vomiting. No additional diarrhea since arriving the hospital. No chest pain or palpitation. Denies any short of breath, no cough  Objective: Vitals:   04/05/21 0600 04/05/21 0630 04/05/21 0700 04/05/21 0800  BP: 125/73 135/77 140/80 (!) 141/81  Pulse: 100 (!) 103 (!) 35 (!) 102  Resp:    19  Temp:      TempSrc:      SpO2: 93% 95% 94% 95%  Weight:      Height:        Intake/Output Summary (Last 24 hours) at 04/05/2021 1050 Last data filed at 04/05/2021 0420 Gross per 24 hour  Intake 3871.62 ml  Output 1500 ml  Net 2371.62 ml   Filed Weights   04/04/21 1245  Weight: 93.7 kg    Examination:  General exam: Appears calm and comfortable  Respiratory system: Clear to auscultation. Respiratory effort normal. Cardiovascular system: S1 & S2 heard, RRR. No JVD, murmurs, rubs, gallops or clicks. No pedal edema. Gastrointestinal system: Abdomen is nondistended, soft and nontender. No organomegaly or masses felt. Normal bowel sounds heard. Central nervous system: Alert and confused No focal neurological deficits. Extremities: Symmetric 5 x 5 power. Skin: No rashes, lesions or ulcers     Data Reviewed: I have personally reviewed following labs and imaging studies  CBC: Recent Labs  Lab 04/04/21 1422 04/05/21 0651  WBC 9.0 21.1*  HGB 15.7* 14.2  HCT 45.2 40.9  MCV 79.9* 81.0  PLT 243 349   Basic Metabolic Panel: Recent Labs  Lab 04/04/21 1422 04/04/21 1608 04/05/21 0651  NA 130*  --  132*  K 2.5*  --  2.5*  CL 89*  --  94*  CO2 28  --  27  GLUCOSE 130*  --  160*  BUN 11  --  11  CREATININE 1.11*  --  1.06*  CALCIUM 8.5*  --  8.1*  MG  --  1.8 2.0   GFR: Estimated Creatinine Clearance: 43.9 mL/min (A) (by C-G formula based on SCr of 1.06 mg/dL (H)). Liver Function Tests: Recent Labs  Lab 04/04/21 1422 04/05/21 0651  AST 29 25  ALT 23 17  ALKPHOS 193* 148*  BILITOT 3.5* 2.9*  PROT 7.2 6.2*  ALBUMIN 2.7* 2.2*   No results for input(s): LIPASE, AMYLASE  in the last 168 hours. No results for input(s): AMMONIA in the last 168 hours. Coagulation Profile: Recent Labs  Lab 04/05/21 0651  INR 1.2   Cardiac Enzymes: No results for input(s): CKTOTAL, CKMB, CKMBINDEX, TROPONINI in the last 168 hours. BNP (last 3 results) No results for input(s): PROBNP in the last 8760 hours. HbA1C: No results for input(s): HGBA1C in the last 72 hours. CBG: No results for input(s): GLUCAP in the last 168 hours. Lipid Profile: No results for input(s): CHOL, HDL, LDLCALC, TRIG, CHOLHDL, LDLDIRECT in the last 72 hours. Thyroid Function Tests: No results for input(s): TSH, T4TOTAL, FREET4, T3FREE, THYROIDAB in the last 72 hours. Anemia Panel: No results for input(s): VITAMINB12, FOLATE, FERRITIN, TIBC, IRON, RETICCTPCT in the last 72 hours. Sepsis Labs: Recent Labs  Lab 04/04/21 1735 04/04/21 2211 04/05/21 0651  PROCALCITON  --   --  0.51  LATICACIDVEN 3.0* 2.4*  --     Recent Results (from the past 240 hour(s))  Resp Panel by RT-PCR (Flu A&B, Covid) Nasopharyngeal Swab     Status: None   Collection Time: 04/04/21  4:36 PM   Specimen: Nasopharyngeal Swab; Nasopharyngeal(NP) swabs in vial transport medium  Result Value Ref Range Status   SARS Coronavirus 2 by RT PCR NEGATIVE NEGATIVE Final    Comment: (NOTE) SARS-CoV-2 target nucleic acids are NOT DETECTED.  The SARS-CoV-2 RNA is generally detectable in upper respiratory specimens during the acute phase of infection. The lowest concentration of SARS-CoV-2 viral copies this assay can detect is 138 copies/mL. A negative result does not preclude SARS-Cov-2 infection and should not be used as the sole basis for treatment or other patient management decisions. A negative result may occur with  improper specimen collection/handling, submission of specimen other than nasopharyngeal swab, presence of viral mutation(s) within the areas targeted by this assay, and inadequate number of viral copies(<138  copies/mL). A negative result must be combined with clinical observations, patient history, and epidemiological information. The expected result is Negative.  Fact Sheet for Patients:  EntrepreneurPulse.com.au  Fact Sheet for Healthcare Providers:  IncredibleEmployment.be  This test is no t yet approved or cleared by the [place] FDA and  has been authorized for detection and/or diagnosis of SARS-CoV-2 by FDA under an Emergency Use Authorization (EUA). This EUA will remain  in effect (meaning this test can be used) for the duration of the COVID-19 declaration under Section 564(b)(1) of the Act, 21 U.S.C.section 360bbb-3(b)(1), unless the authorization is terminated  or revoked sooner.       Influenza A by PCR NEGATIVE NEGATIVE Final   Influenza B by PCR NEGATIVE NEGATIVE Final    Comment: (NOTE) The Xpert Xpress SARS-CoV-2/FLU/RSV plus assay is intended as an aid in the diagnosis of influenza from Nasopharyngeal swab specimens and should not be used as a sole basis for treatment. Nasal washings and aspirates are unacceptable for Xpert Xpress SARS-CoV-2/FLU/RSV testing.  Fact Sheet for Patients: EntrepreneurPulse.com.au  Fact Sheet for Healthcare Providers: IncredibleEmployment.be  This test is not yet approved or cleared by the [place] FDA and has been authorized for detection and/or diagnosis of SARS-CoV-2 by FDA under an Emergency Use Authorization (EUA). This EUA will remain in effect (meaning this test can be used) for the duration of the COVID-19 declaration under Section 564(b)(1) of the Act, 21 U.S.C. section 360bbb-3(b)(1), unless the authorization is terminated or revoked.  Performed at [facility], [address] [place], [place] [address]   Blood culture (routine x 2)     Status: None (Preliminary result)   Collection Time: 04/04/21  5:36 PM   Specimen: BLOOD   Result Value Ref Range Status   Specimen Description BLOOD BLOOD LEFT HAND  Final   Special Requests   Final    BOTTLES DRAWN AEROBIC ONLY Blood Culture adequate volume   Culture   Final    NO GROWTH < 12 HOURS Performed at [facility], [address], [place], [place] [address]    Report Status PENDING  Incomplete  Blood culture (routine x 2)     Status: None (Preliminary result)   Collection Time: 04/04/21  5:38 PM   Specimen: BLOOD  Result Value Ref Range Status   Specimen Description BLOOD RIGHT ANTECUBITAL  Final   Special Requests   Final    BOTTLES DRAWN AEROBIC AND ANAEROBIC Blood Culture results may not be  optimal due to an inadequate volume of blood received in culture bottles   Culture   Final    NO GROWTH < 12 HOURS Performed at [facility], [address] [place], [place] [address]    Report Status PENDING  Incomplete         Radiology Studies: CT HEAD WO CONTRAST (5MM)  Result Date: 04/04/2021 CLINICAL DATA:  Mental status change.  Fall this morning. EXAM: CT HEAD WITHOUT CONTRAST CT CERVICAL SPINE WITHOUT CONTRAST TECHNIQUE: Multidetector CT imaging of the head and cervical spine was performed following the standard protocol without intravenous contrast. Multiplanar CT image reconstructions of the cervical spine were also generated. COMPARISON:  None. FINDINGS: CT HEAD FINDINGS Diminished exam detail secondary to motion artifact. Brain: No evidence of acute infarction, hemorrhage, hydrocephalus, extra-axial collection or mass lesion/mass effect. There is moderate to marked diffuse low-attenuation within the subcortical and periventricular white matter compatible with chronic microvascular disease. Remote bilateral basal ganglia lacunar infarcts. Vascular: No hyperdense vessel or unexpected calcification. Skull: Normal. Negative for fracture or focal lesion. Sinuses/Orbits: No acute finding. Other: None CT CERVICAL SPINE FINDINGS Diminished exam  detail due to motion artifact. Alignment: Straightening of normal cervical lordosis. Anterolisthesis of C2 on C3 and C3 on C4 identified, likely due to chronic spondylosis. Skull base and vertebrae: The vertebral body heights are well preserved. No signs of acute fracture or dislocation. Soft tissues and spinal canal: No prevertebral fluid or swelling. No visible canal hematoma. Disc levels: Multilevel disc space narrowing and endplate spurring is identified. This is most advanced at C3-4, C4-5 and C5-6. Upper chest: Extensive chronic lung disease identified. There is a 9 mm subpleural nodule in the posteromedial right upper lobe, image 79/10. Not significantly changed when compared with 10/05/2020. Other: None IMPRESSION: 1. Moderately diminished exam detail secondary to excessive motion artifact. Within this limitation there are no acute intracranial abnormalities. 2. Chronic small vessel ischemic change and brain atrophy. 3. Evaluation of the cervical spine is also limited due to motion artifact. No signs of acute cervical spine fracture. 4. Advanced cervical spondylosis. 5. There is a 9 mm subpleural nodule in the posteromedial right upper lobe. Unchanged when compared with 10/05/2020. Electronically Signed   By: Kerby Moors M.D.   On: 04/04/2021 17:03   CT Cervical Spine Wo Contrast  Result Date: 04/04/2021 CLINICAL DATA:  Mental status change.  Fall this morning. EXAM: CT HEAD WITHOUT CONTRAST CT CERVICAL SPINE WITHOUT CONTRAST TECHNIQUE: Multidetector CT imaging of the head and cervical spine was performed following the standard protocol without intravenous contrast. Multiplanar CT image reconstructions of the cervical spine were also generated. COMPARISON:  None. FINDINGS: CT HEAD FINDINGS Diminished exam detail secondary to motion artifact. Brain: No evidence of acute infarction, hemorrhage, hydrocephalus, extra-axial collection or mass lesion/mass effect. There is moderate to marked diffuse  low-attenuation within the subcortical and periventricular white matter compatible with chronic microvascular disease. Remote bilateral basal ganglia lacunar infarcts. Vascular: No hyperdense vessel or unexpected calcification. Skull: Normal. Negative for fracture or focal lesion. Sinuses/Orbits: No acute finding. Other: None CT CERVICAL SPINE FINDINGS Diminished exam detail due to motion artifact. Alignment: Straightening of normal cervical lordosis. Anterolisthesis of C2 on C3 and C3 on C4 identified, likely due to chronic spondylosis. Skull base and vertebrae: The vertebral body heights are well preserved. No signs of acute fracture or dislocation. Soft tissues and spinal canal: No prevertebral fluid or swelling. No visible canal hematoma. Disc levels: Multilevel disc space narrowing and endplate spurring  is identified. This is most advanced at C3-4, C4-5 and C5-6. Upper chest: Extensive chronic lung disease identified. There is a 9 mm subpleural nodule in the posteromedial right upper lobe, image 79/10. Not significantly changed when compared with 10/05/2020. Other: None IMPRESSION: 1. Moderately diminished exam detail secondary to excessive motion artifact. Within this limitation there are no acute intracranial abnormalities. 2. Chronic small vessel ischemic change and brain atrophy. 3. Evaluation of the cervical spine is also limited due to motion artifact. No signs of acute cervical spine fracture. 4. Advanced cervical spondylosis. 5. There is a 9 mm subpleural nodule in the posteromedial right upper lobe. Unchanged when compared with 10/05/2020. Electronically Signed   By: Kerby Moors M.D.   On: 04/04/2021 17:03   CT CHEST ABDOMEN PELVIS W CONTRAST  Addendum Date: 04/04/2021   ADDENDUM REPORT: 04/04/2021 21:13 ADDENDUM: Should be noted the proximal and mid colon show decompression with some wall edema which may represent mild colitis. Electronically Signed   By: Inez Catalina M.D.   On: 04/04/2021  21:13   Result Date: 04/04/2021 CLINICAL DATA:  Chronic dyspnea and abdominal distension following recent fall, initial encounter EXAM: CT CHEST, ABDOMEN, AND PELVIS WITH CONTRAST TECHNIQUE: Multidetector CT imaging of the chest, abdomen and pelvis was performed following the standard protocol during bolus administration of intravenous contrast. CONTRAST:  40m OMNIPAQUE IOHEXOL 350 MG/ML SOLN COMPARISON:  01/21/2021, 01/20/2021 FINDINGS: CT CHEST FINDINGS Cardiovascular: Atherosclerotic calcifications of the thoracic aorta are noted. No aneurysmal dilatation or dissection is noted. Mitral valve calcifications are seen as well as coronary calcifications. No cardiac enlargement is seen. Pulmonary artery as visualized shows no embolus although not timed for embolus evaluation. Mediastinum/Nodes: Thoracic inlet is within normal limits. Stable appearing mediastinal and hilar lymph nodes are noted similar to that seen on prior exam from 01/20/2021. The esophagus as visualized is within normal limits. Lungs/Pleura: Subpleural fibrotic changes are again identified similar to that seen on prior CT examination. No acute infiltrate or sizable effusion is noted. No sizable parenchymal nodules are noted. Musculoskeletal: Old rib fractures are noted on the right. No definitive acute rib fractures are seen. Degenerative changes of the thoracic spine are noted. No compression deformity is seen. CT ABDOMEN PELVIS FINDINGS Hepatobiliary: Liver is diffusely fatty infiltrated. Gallbladder demonstrates multiple gallstones within. Pancreas: Unremarkable. No pancreatic ductal dilatation or surrounding inflammatory changes. Spleen: Scattered calcified granulomas are noted within the spleen stable from prior CT. Adrenals/Urinary Tract: Adrenal glands are within normal limits. Right kidney is somewhat shrunken progressed when compared with the prior exam which may be related to the aortic stent graft placement. No renal calculi are seen.  No obstructive changes are noted. Stable lesion arising from the lower pole of the left kidney is again seen likely representing complex cyst. The bladder is decompressed by Foley catheter. Stomach/Bowel: Scattered diverticular change of the colon is noted without evidence of diverticulitis. The ascending, transverse and proximal descending colons are decompressed with some mild wall edema suggestive of underlying colitis. The small bowel and stomach appear within normal limits. Vascular/Lymphatic: Abdominal aortic aneurysm is again seen. There is been interval stent graft therapy performed which is widely patent. No evidence of endoleak is noted. The maximum aneurysmal sac diameter is 7.9 cm relatively stable from the prior exam. Left renal artery stent appears patent. The right renal artery stent is not well opacified and may be occluded distally which would account for the shrunken right kidney when compared with the prior exam. Changes of prior  embolization of the right internal iliac artery are seen. Stable right common iliac artery aneurysms is noted but excluded by the stent graft. Reproductive: Uterus and bilateral adnexa are unremarkable. Other: No abdominal wall hernia or abnormality. No abdominopelvic ascites. Musculoskeletal: Postsurgical changes in the proximal left femur are seen. Degenerative changes of lumbar spine are seen. No compression deformity is noted. IMPRESSION: CT of the chest: Stable mediastinal and hilar lymph nodes when compare with the prior exam. 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 CT of the abdomen and pelvis: Fatty infiltration of the liver. Interval exclusion of the abdominal aortic aneurysm by stent graft therapy with embolization of the right internal iliac artery and stenting of both renal arteries. The right renal artery stent does not appear to opacify satisfactorily and would account for the right renal atrophy when compared with the prior exam.  Aneurysm sac is stable from the prior exam without endoleak. Diverticulosis without diverticulitis. Stable cystic lesion arising from the lower pole of the left kidney. Nonemergent MRI is again recommended for further evaluation as previously described. Stable cholelithiasis. Electronically Signed: By: Inez Catalina M.D. On: 04/04/2021 20:50   DG Chest Portable 1 View  Result Date: 04/04/2021 CLINICAL DATA:  Hypoxia.  Altered mental status. EXAM: PORTABLE CHEST 1 VIEW COMPARISON:  Radiograph 01/24/2021 chest CT 01/20/2009 FINDINGS: Stable borderline cardiomegaly. Aortic atherosclerosis and tortuosity. Chronic interstitial thickening in a peripheral predominant distribution. No superimposed acute airspace disease. No pleural effusion or pneumothorax. Abdominal aortic stent graft is partially visualized. No acute osseous abnormalities are seen. IMPRESSION: Chronic interstitial thickening, suspicious for interstitial lung disease. No superimposed acute process. Electronically Signed   By: Keith Rake M.D.   On: 04/04/2021 17:01   US Abdomen Limited RUQ (LIVER/GB)  Result Date: 04/04/2021 CLINICAL DATA:  History of cholelithiasis EXAM: ULTRASOUND ABDOMEN LIMITED RIGHT UPPER QUADRANT COMPARISON:  CT from earlier in the same day. FINDINGS: Gallbladder: Gallbladder is well distended with evidence of cholelithiasis. No wall thickening or pericholecystic fluid is noted. Negative sonographic Murphy's sign is elicited. Common bile duct: Diameter: 3.4 mm. Liver: Fatty infiltration of the liver is noted. No focal mass is seen. Portal vein is patent on color Doppler imaging with normal direction of blood flow towards the liver. Other: None. IMPRESSION: Cholelithiasis without complicating factors. Fatty liver similar to that seen on CT. Electronically Signed   By: Inez Catalina M.D.   On: 04/04/2021 21:43        Scheduled Meds:  heparin  5,000 Units Subcutaneous Q8H   Continuous Infusions:  ceFEPime (MAXIPIME)  IV Stopped (04/05/21 0043)   dextrose 5% lactated ringers 75 mL/hr at 04/05/21 0727   potassium chloride Stopped (04/05/21 1048)     LOS: 1 day    Time spent: 35 minutes    Sharen Hones, MD [facility]   To contact the attending provider between 7A-7P or the covering provider during after hours 7P-7A, please log into the web site [email or web address] and access using universal [place] password for that web site. If you do not have the password, please call the hospital operator.  04/05/2021, 10:50 AM

## 2021-04-05 NOTE — ED Notes (Signed)
Lunch tray held patient unable to eat due to patients unresponsiveness.

## 2021-04-05 NOTE — ED Notes (Signed)
Critical  K+ of 2.5. MD notified and aware

## 2021-04-06 DIAGNOSIS — E43 Unspecified severe protein-calorie malnutrition: Secondary | ICD-10-CM | POA: Insufficient documentation

## 2021-04-06 DIAGNOSIS — L8990 Pressure ulcer of unspecified site, unspecified stage: Secondary | ICD-10-CM | POA: Insufficient documentation

## 2021-04-06 LAB — CBC WITH DIFFERENTIAL/PLATELET
Abs Immature Granulocytes: 0.07 10*3/uL (ref 0.00–0.07)
Basophils Absolute: 0 10*3/uL (ref 0.0–0.1)
Basophils Relative: 0 %
Eosinophils Absolute: 0.2 10*3/uL (ref 0.0–0.5)
Eosinophils Relative: 1 %
HCT: 41.7 % (ref 36.0–46.0)
Hemoglobin: 13.6 g/dL (ref 12.0–15.0)
Immature Granulocytes: 0 %
Lymphocytes Relative: 11 %
Lymphs Abs: 1.8 10*3/uL (ref 0.7–4.0)
MCH: 27.6 pg (ref 26.0–34.0)
MCHC: 32.6 g/dL (ref 30.0–36.0)
MCV: 84.8 fL (ref 80.0–100.0)
Monocytes Absolute: 0.9 10*3/uL (ref 0.1–1.0)
Monocytes Relative: 5 %
Neutro Abs: 13.6 10*3/uL — ABNORMAL HIGH (ref 1.7–7.7)
Neutrophils Relative %: 83 %
Platelets: 246 10*3/uL (ref 150–400)
RBC: 4.92 MIL/uL (ref 3.87–5.11)
RDW: 22 % — ABNORMAL HIGH (ref 11.5–15.5)
WBC: 16.6 10*3/uL — ABNORMAL HIGH (ref 4.0–10.5)
nRBC: 0 % (ref 0.0–0.2)

## 2021-04-06 LAB — URINE CULTURE: Culture: NO GROWTH

## 2021-04-06 LAB — GLUCOSE, CAPILLARY: Glucose-Capillary: 98 mg/dL (ref 70–99)

## 2021-04-06 LAB — BASIC METABOLIC PANEL
Anion gap: 5 (ref 5–15)
BUN: 17 mg/dL (ref 8–23)
CO2: 28 mmol/L (ref 22–32)
Calcium: 7.9 mg/dL — ABNORMAL LOW (ref 8.9–10.3)
Chloride: 104 mmol/L (ref 98–111)
Creatinine, Ser: 0.98 mg/dL (ref 0.44–1.00)
GFR, Estimated: 57 mL/min — ABNORMAL LOW (ref >60)
Glucose, Bld: 107 mg/dL — ABNORMAL HIGH (ref 70–99)
Potassium: 4.3 mmol/L (ref 3.5–5.1)
Sodium: 137 mmol/L (ref 135–145)

## 2021-04-06 LAB — MRSA NEXT GEN BY PCR, NASAL: MRSA by PCR Next Gen: NOT DETECTED

## 2021-04-06 LAB — MAGNESIUM: Magnesium: 2.2 mg/dL (ref 1.7–2.4)

## 2021-04-06 MED ORDER — ENSURE ENLIVE PO LIQD
237.0000 mL | Freq: Two times a day (BID) | ORAL | Status: DC
  Administered 2021-04-10 – 2021-04-13 (×6): 237 mL via ORAL

## 2021-04-06 MED ORDER — MORPHINE SULFATE (PF) 2 MG/ML IV SOLN: 1.0000 mg | INTRAVENOUS | Status: DC | PRN

## 2021-04-06 NOTE — Progress Notes (Signed)
Pharmacy Antibiotic Note  Monica [name] is a 87 y.o. female admitted on 04/04/2021 with UTI w/ suspected sepsis.  Pharmacy has been consulted for Cefepime dosing.  Plan: Cefepime 2 gm q12h per indication and renal fxn.  Pharmacy will continue to follow and adjust dose when warranted.  Height: 5\' 5"  (165.1 cm) Weight: 87.6 kg (193 lb 2 oz) IBW/kg (Calculated) : 57  Temp (24hrs), Avg:98.9 F (37.2 C), Min:98.6 F (37 C), Max:99.5 F (37.5 C)  Recent Labs  Lab 04/04/21 1422 04/04/21 1735 04/04/21 2211 04/05/21 0651 04/06/21 0504  WBC 9.0  --   --  21.1* 16.6*  CREATININE 1.11*  --   --  1.06* 0.98  LATICACIDVEN  --  3.0* 2.4*  --   --      Estimated Creatinine Clearance: 45.8 mL/min (by C-G formula based on SCr of 0.98 mg/dL).    No Known Allergies  Antimicrobials this admission: 8/24 Ceftriaxone >> x 1 8/24 Vancomycin >> x 1 8/25 Cefepime >>   Microbiology results: 8/24 BCx: Pending 8/24 Ucx: NG  Dr 9/24 will like to continue Cefepime until Bx cultures resulted. Patient continue to have poor po intake and speech therapy consulted.   Will continue cefepime 2gm q 12 hour and follow up cultures as indicated.  Monica [name] PharmD, BCPS 04/06/2021 2:29 PM

## 2021-04-06 NOTE — Progress Notes (Signed)
PROGRESS NOTE    [name]  LSL:[id number] DOB: 09-Mar-1934 DOA: 04/04/2021 PCP: Pcp, No   CC: altered mental status Brief Narrative:  Monica [name] is a 87 y.o. female with medical history significant of dementia, coronary artery disease with reported EF of 30 to 35%,, essential hypertension, History of AAA status post endovascular repair, history of GI bleed, hyperlipidemia, morbid obesity, history of aortic stenosis, status post intramedullary nail and multiple cardiac cath brought in today due to altered mental status.  Upon arriving the hospital, patient had abnormal urine, suggesting UTI.  CT chest/abdomen/pelvis suggestive of mild colitis.  Upper quadrant ultrasound did not show any evidence of cholecystitis.  Patient met severe sepsis criteria with tachycardia, tachypnea, significant leukocytosis, elevated lactic acid of 3.0, profound mental status changes, elevated procalcitonin level. Per patient daughter, she just nursing home about 5 days ago, symptoms started 2 days ago with weakness and confusion.  She also had 2 days of diarrhea.  She does not have confusion at baseline, but she is wheelchair-bound. She is placed on cefepime for urinary tract infection while urine culture and blood culture sent out   Assessment & Plan:   Principal Problem:   Severe sepsis (Monica [name]) Active Problems:   CAD (coronary artery disease)   Severe aortic stenosis   Abdominal aortic aneurysm (AAA) without rupture (HCC)   HFrEF (heart failure with reduced ejection fraction) (HCC)   Hypokalemia   Hyponatremia   Acute metabolic encephalopathy   Pressure injury of skin   Protein-calorie malnutrition, severe  #1.  Severe sepsis.  Secondary to UTI. Urinary tract infection. Acute metabolic encephalopathy secondary to UTI. Mild dementia. Patient is more awake today, but still very confused. Her initial CT had a did not show any acute changes.  I believe altered mental status is still from  infection. Urine culture and blood cultures are still pending results. Continue cefepime for now. Patient also has been seen by speech therapy, she is not reliably taking any nutrition at this time.  We will continue to treat her with antibiotics, may consider tube feeding if she does not improve.  2.  Severe hypokalemia Hyponatremia. Potassium much better, she has not been able to take p.o. reliably, will continue IV fluids.  Monitor electrolytes.  3.  Colitis. Continue antibiotics per  4.  Acute kidney injury. Condition improving.  #5.  Severe aortic stenosis status post TV TAVR. Coronary artery disease Stable    DVT prophylaxis: Heparin Code Status: DNR Family Communication: daughter updated Disposition Plan:    Status is: Inpatient  Remains inpatient appropriate because:IV treatments appropriate due to intensity of illness or inability to take PO and Inpatient level of care appropriate due to severity of illness  Dispo: The patient is from: Home              Anticipated d/c is to: SNF              Patient currently is not medically stable to d/c.   Difficult to place patient No        I/O last 3 completed shifts: In: 5711.8 [I.V.:1385.1; IV Piggyback:4326.7] Out: 2285 [Urine:2285] Total I/O In: 245.1 [I.V.:245.1] Out: 60 [Urine:45]     Consultants:  none  Procedures: none  Antimicrobials: cefepime  Subjective: Patient is still very sleepy, spoke with the nurse and dietitian, patient has not been able to eat. She does not have any fever or chills, does not have any abdominal pain or nausea vomiting. No short of breath or  cough. No fever or chills.   Objective: Vitals:   04/06/21 0900 04/06/21 1003 04/06/21 1100 04/06/21 1200  BP: (!) 104/48 112/62 (!) 119/52 131/79  Pulse: 93 90 96 95  Resp: (!) 21 18 (!) 22 12  Temp:      TempSrc:      SpO2: 99% 100% 98% 95%  Weight:      Height:        Intake/Output Summary (Last 24 hours) at 04/06/2021  1247 Last data filed at 04/06/2021 1000 Gross per 24 hour  Intake 2216.87 ml  Output 830 ml  Net 1386.87 ml   Filed Weights   04/04/21 1245 04/05/21 1506  Weight: 93.7 kg 87.6 kg    Examination:  General exam: Appears calm and comfortable  Respiratory system: Clear to auscultation. Respiratory effort normal. Cardiovascular system: S1 & S2 heard, RRR. No JVD, murmurs, rubs, gallops or clicks. No pedal edema. Gastrointestinal system: Abdomen is nondistended, soft and nontender. No organomegaly or masses felt. Normal bowel sounds heard. Central nervous system: Drowsy and oriented x1. No focal neurological deficits. Extremities: Symmetric 5 x 5 power. Skin: No rashes, lesions or ulcers    Data Reviewed: I have personally reviewed following labs and imaging studies  CBC: Recent Labs  Lab 04/04/21 1422 04/05/21 0651 04/06/21 0504  WBC 9.0 21.1* 16.6*  NEUTROABS  --   --  13.6*  HGB 15.7* 14.2 13.6  HCT 45.2 40.9 41.7  MCV 79.9* 81.0 84.8  PLT 243 221 076   Basic Metabolic Panel: Recent Labs  Lab 04/04/21 1422 04/04/21 1608 04/05/21 0651 04/05/21 1507 04/06/21 0504  NA 130*  --  132*  --  137  K 2.5*  --  2.5* 3.1* 4.3  CL 89*  --  94*  --  104  CO2 28  --  27  --  28  GLUCOSE 130*  --  160*  --  107*  BUN 11  --  11  --  17  CREATININE 1.11*  --  1.06*  --  0.98  CALCIUM 8.5*  --  8.1*  --  7.9*  MG  --  1.8 2.0  --  2.2   GFR: Estimated Creatinine Clearance: 45.8 mL/min (by C-G formula based on SCr of 0.98 mg/dL). Liver Function Tests: Recent Labs  Lab 04/04/21 1422 04/05/21 0651  AST 29 25  ALT 23 17  ALKPHOS 193* 148*  BILITOT 3.5* 2.9*  PROT 7.2 6.2*  ALBUMIN 2.7* 2.2*   No results for input(s): LIPASE, AMYLASE in the last 168 hours. No results for input(s): AMMONIA in the last 168 hours. Coagulation Profile: Recent Labs  Lab 04/05/21 0651  INR 1.2   Cardiac Enzymes: No results for input(s): CKTOTAL, CKMB, CKMBINDEX, TROPONINI in the last  168 hours. BNP (last 3 results) No results for input(s): PROBNP in the last 8760 hours. HbA1C: No results for input(s): HGBA1C in the last 72 hours. CBG: Recent Labs  Lab 04/05/21 1503  GLUCAP 110*   Lipid Profile: No results for input(s): CHOL, HDL, LDLCALC, TRIG, CHOLHDL, LDLDIRECT in the last 72 hours. Thyroid Function Tests: No results for input(s): TSH, T4TOTAL, FREET4, T3FREE, THYROIDAB in the last 72 hours. Anemia Panel: No results for input(s): VITAMINB12, FOLATE, FERRITIN, TIBC, IRON, RETICCTPCT in the last 72 hours. Sepsis Labs: Recent Labs  Lab 04/04/21 1735 04/04/21 2211 04/05/21 0651  PROCALCITON  --   --  0.51  LATICACIDVEN 3.0* 2.4*  --     Recent Results (from  the past 240 hour(s))  Resp Panel by RT-PCR (Flu A&B, Covid) Nasopharyngeal Swab     Status: None   Collection Time: 04/04/21  4:36 PM   Specimen: Nasopharyngeal Swab; Nasopharyngeal(NP) swabs in vial transport medium  Result Value Ref Range Status   SARS Coronavirus 2 by RT PCR NEGATIVE NEGATIVE Final    Comment: (NOTE) SARS-CoV-2 target nucleic acids are NOT DETECTED.  The SARS-CoV-2 RNA is generally detectable in upper respiratory specimens during the acute phase of infection. The lowest concentration of SARS-CoV-2 viral copies this assay can detect is 138 copies/mL. A negative result does not preclude SARS-Cov-2 infection and should not be used as the sole basis for treatment or other patient management decisions. A negative result may occur with  improper specimen collection/handling, submission of specimen other than nasopharyngeal swab, presence of viral mutation(s) within the areas targeted by this assay, and inadequate number of viral copies(<138 copies/mL). A negative result must be combined with clinical observations, patient history, and epidemiological information. The expected result is Negative.  Fact Sheet for Patients:  EntrepreneurPulse.com.au  Fact Sheet for  Healthcare Providers:  IncredibleEmployment.be  This test is no t yet approved or cleared by the [place] FDA and  has been authorized for detection and/or diagnosis of SARS-CoV-2 by FDA under an Emergency Use Authorization (EUA). This EUA will remain  in effect (meaning this test can be used) for the duration of the COVID-19 declaration under Section 564(b)(1) of the Act, 21 U.S.C.section 360bbb-3(b)(1), unless the authorization is terminated  or revoked sooner.       Influenza A by PCR NEGATIVE NEGATIVE Final   Influenza B by PCR NEGATIVE NEGATIVE Final    Comment: (NOTE) The Xpert Xpress SARS-CoV-2/FLU/RSV plus assay is intended as an aid in the diagnosis of influenza from Nasopharyngeal swab specimens and should not be used as a sole basis for treatment. Nasal washings and aspirates are unacceptable for Xpert Xpress SARS-CoV-2/FLU/RSV testing.  Fact Sheet for Patients: EntrepreneurPulse.com.au  Fact Sheet for Healthcare Providers: IncredibleEmployment.be  This test is not yet approved or cleared by the [place] FDA and has been authorized for detection and/or diagnosis of SARS-CoV-2 by FDA under an Emergency Use Authorization (EUA). This EUA will remain in effect (meaning this test can be used) for the duration of the COVID-19 declaration under Section 564(b)(1) of the Act, 21 U.S.C. section 360bbb-3(b)(1), unless the authorization is terminated or revoked.  Performed at [facility], [address] [place], [place] [address]   Urine Culture     Status: None   Collection Time: 04/04/21  4:36 PM   Specimen: Urine, Random  Result Value Ref Range Status   Specimen Description   Final    URINE, RANDOM Performed at [facility], [address], [place], [place] [address]    Special Requests   Final    NONE Performed at [facility], [address] [place], [place] [address]     Culture   Final    NO GROWTH Performed at [facility], [place] [address] [place], [place] [address]    Report Status 04/06/2021 FINAL  Final  Blood culture (routine x 2)     Status: None (Preliminary result)   Collection Time: 04/04/21  5:36 PM   Specimen: BLOOD  Result Value Ref Range Status   Specimen Description BLOOD BLOOD LEFT HAND  Final   Special Requests   Final    BOTTLES DRAWN AEROBIC ONLY Blood Culture adequate volume  Culture   Final    NO GROWTH 2 DAYS Performed at [facility], [address] [place], [place] [address]    Report Status PENDING  Incomplete  Blood culture (routine x 2)     Status: None (Preliminary result)   Collection Time: 04/04/21  5:38 PM   Specimen: BLOOD  Result Value Ref Range Status   Specimen Description BLOOD RIGHT ANTECUBITAL  Final   Special Requests   Final    BOTTLES DRAWN AEROBIC AND ANAEROBIC Blood Culture results may not be optimal due to an inadequate volume of blood received in culture bottles   Culture   Final    NO GROWTH 2 DAYS Performed at [facility], [address], [place], [place] [address]    Report Status PENDING  Incomplete  MRSA Next Gen by PCR, Nasal     Status: None   Collection Time: 04/06/21  9:25 AM   Specimen: Nasal Mucosa; Nasal Swab  Result Value Ref Range Status   MRSA by PCR Next Gen NOT DETECTED NOT DETECTED Final    Comment: (NOTE) The GeneXpert MRSA Assay (FDA approved for NASAL specimens only), is one component of a comprehensive MRSA colonization surveillance program. It is not intended to diagnose MRSA infection nor to guide or monitor treatment for MRSA infections. Test performance is not FDA approved in patients less than 45 years old. Performed at [facility], [address], [place], [place] [address]          Radiology Studies: CT HEAD WO CONTRAST (5MM)  Result Date: 04/04/2021 CLINICAL DATA:  Mental status change.  Fall this morning. EXAM: CT  HEAD WITHOUT CONTRAST CT CERVICAL SPINE WITHOUT CONTRAST TECHNIQUE: Multidetector CT imaging of the head and cervical spine was performed following the standard protocol without intravenous contrast. Multiplanar CT image reconstructions of the cervical spine were also generated. COMPARISON:  None. FINDINGS: CT HEAD FINDINGS Diminished exam detail secondary to motion artifact. Brain: No evidence of acute infarction, hemorrhage, hydrocephalus, extra-axial collection or mass lesion/mass effect. There is moderate to marked diffuse low-attenuation within the subcortical and periventricular white matter compatible with chronic microvascular disease. Remote bilateral basal ganglia lacunar infarcts. Vascular: No hyperdense vessel or unexpected calcification. Skull: Normal. Negative for fracture or focal lesion. Sinuses/Orbits: No acute finding. Other: None CT CERVICAL SPINE FINDINGS Diminished exam detail due to motion artifact. Alignment: Straightening of normal cervical lordosis. Anterolisthesis of C2 on C3 and C3 on C4 identified, likely due to chronic spondylosis. Skull base and vertebrae: The vertebral body heights are well preserved. No signs of acute fracture or dislocation. Soft tissues and spinal canal: No prevertebral fluid or swelling. No visible canal hematoma. Disc levels: Multilevel disc space narrowing and endplate spurring is identified. This is most advanced at C3-4, C4-5 and C5-6. Upper chest: Extensive chronic lung disease identified. There is a 9 mm subpleural nodule in the posteromedial right upper lobe, image 79/10. Not significantly changed when compared with 10/05/2020. Other: None IMPRESSION: 1. Moderately diminished exam detail secondary to excessive motion artifact. Within this limitation there are no acute intracranial abnormalities. 2. Chronic small vessel ischemic change and brain atrophy. 3. Evaluation of the cervical spine is also limited due to motion artifact. No signs of acute cervical  spine fracture. 4. Advanced cervical spondylosis. 5. There is a 9 mm subpleural nodule in the posteromedial right upper lobe. Unchanged when compared with 10/05/2020. Electronically Signed   By: Kerby Moors M.D.   On: 04/04/2021 17:03  CT Cervical Spine Wo Contrast  Result Date: 04/04/2021 CLINICAL DATA:  Mental status change.  Fall this morning. EXAM: CT HEAD WITHOUT CONTRAST CT CERVICAL SPINE WITHOUT CONTRAST TECHNIQUE: Multidetector CT imaging of the head and cervical spine was performed following the standard protocol without intravenous contrast. Multiplanar CT image reconstructions of the cervical spine were also generated. COMPARISON:  None. FINDINGS: CT HEAD FINDINGS Diminished exam detail secondary to motion artifact. Brain: No evidence of acute infarction, hemorrhage, hydrocephalus, extra-axial collection or mass lesion/mass effect. There is moderate to marked diffuse low-attenuation within the subcortical and periventricular white matter compatible with chronic microvascular disease. Remote bilateral basal ganglia lacunar infarcts. Vascular: No hyperdense vessel or unexpected calcification. Skull: Normal. Negative for fracture or focal lesion. Sinuses/Orbits: No acute finding. Other: None CT CERVICAL SPINE FINDINGS Diminished exam detail due to motion artifact. Alignment: Straightening of normal cervical lordosis. Anterolisthesis of C2 on C3 and C3 on C4 identified, likely due to chronic spondylosis. Skull base and vertebrae: The vertebral body heights are well preserved. No signs of acute fracture or dislocation. Soft tissues and spinal canal: No prevertebral fluid or swelling. No visible canal hematoma. Disc levels: Multilevel disc space narrowing and endplate spurring is identified. This is most advanced at C3-4, C4-5 and C5-6. Upper chest: Extensive chronic lung disease identified. There is a 9 mm subpleural nodule in the posteromedial right upper lobe, image 79/10. Not significantly changed  when compared with 10/05/2020. Other: None IMPRESSION: 1. Moderately diminished exam detail secondary to excessive motion artifact. Within this limitation there are no acute intracranial abnormalities. 2. Chronic small vessel ischemic change and brain atrophy. 3. Evaluation of the cervical spine is also limited due to motion artifact. No signs of acute cervical spine fracture. 4. Advanced cervical spondylosis. 5. There is a 9 mm subpleural nodule in the posteromedial right upper lobe. Unchanged when compared with 10/05/2020. Electronically Signed   By: Kerby Moors M.D.   On: 04/04/2021 17:03   CT CHEST ABDOMEN PELVIS W CONTRAST  Addendum Date: 04/04/2021   ADDENDUM REPORT: 04/04/2021 21:13 ADDENDUM: Should be noted the proximal and mid colon show decompression with some wall edema which may represent mild colitis. Electronically Signed   By: Inez Catalina M.D.   On: 04/04/2021 21:13   Result Date: 04/04/2021 CLINICAL DATA:  Chronic dyspnea and abdominal distension following recent fall, initial encounter EXAM: CT CHEST, ABDOMEN, AND PELVIS WITH CONTRAST TECHNIQUE: Multidetector CT imaging of the chest, abdomen and pelvis was performed following the standard protocol during bolus administration of intravenous contrast. CONTRAST:  7m OMNIPAQUE IOHEXOL 350 MG/ML SOLN COMPARISON:  01/21/2021, 01/20/2021 FINDINGS: CT CHEST FINDINGS Cardiovascular: Atherosclerotic calcifications of the thoracic aorta are noted. No aneurysmal dilatation or dissection is noted. Mitral valve calcifications are seen as well as coronary calcifications. No cardiac enlargement is seen. Pulmonary artery as visualized shows no embolus although not timed for embolus evaluation. Mediastinum/Nodes: Thoracic inlet is within normal limits. Stable appearing mediastinal and hilar lymph nodes are noted similar to that seen on prior exam from 01/20/2021. The esophagus as visualized is within normal limits. Lungs/Pleura: Subpleural fibrotic  changes are again identified similar to that seen on prior CT examination. No acute infiltrate or sizable effusion is noted. No sizable parenchymal nodules are noted. Musculoskeletal: Old rib fractures are noted on the right. No definitive acute rib fractures are seen. Degenerative changes of the thoracic spine are noted. No compression deformity is seen. CT ABDOMEN PELVIS FINDINGS Hepatobiliary: Liver is diffusely fatty infiltrated. Gallbladder demonstrates multiple  gallstones within. Pancreas: Unremarkable. No pancreatic ductal dilatation or surrounding inflammatory changes. Spleen: Scattered calcified granulomas are noted within the spleen stable from prior CT. Adrenals/Urinary Tract: Adrenal glands are within normal limits. Right kidney is somewhat shrunken progressed when compared with the prior exam which may be related to the aortic stent graft placement. No renal calculi are seen. No obstructive changes are noted. Stable lesion arising from the lower pole of the left kidney is again seen likely representing complex cyst. The bladder is decompressed by Foley catheter. Stomach/Bowel: Scattered diverticular change of the colon is noted without evidence of diverticulitis. The ascending, transverse and proximal descending colons are decompressed with some mild wall edema suggestive of underlying colitis. The small bowel and stomach appear within normal limits. Vascular/Lymphatic: Abdominal aortic aneurysm is again seen. There is been interval stent graft therapy performed which is widely patent. No evidence of endoleak is noted. The maximum aneurysmal sac diameter is 7.9 cm relatively stable from the prior exam. Left renal artery stent appears patent. The right renal artery stent is not well opacified and may be occluded distally which would account for the shrunken right kidney when compared with the prior exam. Changes of prior embolization of the right internal iliac artery are seen. Stable right common iliac  artery aneurysms is noted but excluded by the stent graft. Reproductive: Uterus and bilateral adnexa are unremarkable. Other: No abdominal wall hernia or abnormality. No abdominopelvic ascites. Musculoskeletal: Postsurgical changes in the proximal left femur are seen. Degenerative changes of lumbar spine are seen. No compression deformity is noted. IMPRESSION: CT of the chest: Stable mediastinal and hilar lymph nodes when compare with the prior exam. 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 CT of the abdomen and pelvis: Fatty infiltration of the liver. Interval exclusion of the abdominal aortic aneurysm by stent graft therapy with embolization of the right internal iliac artery and stenting of both renal arteries. The right renal artery stent does not appear to opacify satisfactorily and would account for the right renal atrophy when compared with the prior exam. Aneurysm sac is stable from the prior exam without endoleak. Diverticulosis without diverticulitis. Stable cystic lesion arising from the lower pole of the left kidney. Nonemergent MRI is again recommended for further evaluation as previously described. Stable cholelithiasis. Electronically Signed: By: Inez Catalina M.D. On: 04/04/2021 20:50   DG Chest Portable 1 View  Result Date: 04/04/2021 CLINICAL DATA:  Hypoxia.  Altered mental status. EXAM: PORTABLE CHEST 1 VIEW COMPARISON:  Radiograph 01/24/2021 chest CT 01/20/2009 FINDINGS: Stable borderline cardiomegaly. Aortic atherosclerosis and tortuosity. Chronic interstitial thickening in a peripheral predominant distribution. No superimposed acute airspace disease. No pleural effusion or pneumothorax. Abdominal aortic stent graft is partially visualized. No acute osseous abnormalities are seen. IMPRESSION: Chronic interstitial thickening, suspicious for interstitial lung disease. No superimposed acute process. Electronically Signed   By: Keith Rake M.D.   On: 04/04/2021 17:01    US Abdomen Limited RUQ (LIVER/GB)  Result Date: 04/04/2021 CLINICAL DATA:  History of cholelithiasis EXAM: ULTRASOUND ABDOMEN LIMITED RIGHT UPPER QUADRANT COMPARISON:  CT from earlier in the same day. FINDINGS: Gallbladder: Gallbladder is well distended with evidence of cholelithiasis. No wall thickening or pericholecystic fluid is noted. Negative sonographic Murphy's sign is elicited. Common bile duct: Diameter: 3.4 mm. Liver: Fatty infiltration of the liver is noted. No focal mass is seen. Portal vein is patent on color Doppler imaging with normal direction of blood flow towards the liver. Other: None. IMPRESSION: Cholelithiasis without  complicating factors. Fatty liver similar to that seen on CT. Electronically Signed   By: Inez Catalina M.D.   On: 04/04/2021 21:43        Scheduled Meds:  chlorhexidine  15 mL Mouth Rinse BID   Chlorhexidine Gluconate Cloth  6 each Topical Daily   Chlorhexidine Gluconate Cloth  6 each Topical Q0600   feeding supplement  237 mL Oral BID BM   heparin  5,000 Units Subcutaneous Q8H   mouth rinse  15 mL Mouth Rinse q12n4p   mupirocin ointment  1 application Nasal BID   Continuous Infusions:  ceFEPime (MAXIPIME) IV 2 g (04/06/21 1139)   dextrose 5 % and 0.9 % NaCl with KCl 20 mEq/L 75 mL/hr at 04/06/21 1000     LOS: 2 days    Time spent: 32 minutes, more than 50% time spent on direct patient care.    Sharen Hones, MD [facility]   To contact the attending provider between 7A-7P or the covering provider during after hours 7P-7A, please log into the web site [email or web address] and access using universal [place] password for that web site. If you do not have the password, please call the hospital operator.  04/06/2021, 12:47 PM

## 2021-04-06 NOTE — Progress Notes (Signed)
Initial Nutrition Assessment  DOCUMENTATION CODES:  Severe malnutrition in context of acute illness/injury  INTERVENTION:  Continue current diet as tolerated per pt Ensure Enlive po BID, each supplement provides 350 kcal and 20 grams of protein If mental status does not improve, pt may benefit from a palliative care consult to discuss GOC and pt's wishes (as indicated on MOST form) with family  NUTRITION DIAGNOSIS:  Severe Malnutrition (in the context of acute illness) related to poor appetite (recurrent hospitalizations) as evidenced by percent weight loss, moderate muscle depletion (6.5% x 1 month).  GOAL:  Patient will meet greater than or equal to 90% of their needs  MONITOR:  PO intake, Supplement acceptance, Skin, Weight trends  REASON FOR ASSESSMENT:  Malnutrition Screening Tool    ASSESSMENT:  87 y.o. female with PMH of CAD, NSTEMI, HLD, CHF (EF 30 to 35%), and mild dementia who presented to ED with AMS worsening over the last several days. Found to be septic related to UTI.  Pt has been hospitalized several times in the last few months and followed by RD service at that time. Reviewed weight hx and 6.5% weight loss noted over the last ~1 month which is severe.  IVF providing some kcal at this time (306 kcal/d)  Pt resting in bed at the time of visit. Opened eyes during physical exam, but did not speak. Only moaned/grunted and then dozed back off to sleep. Significant edema present throughout body, but fat and muscle deficits were seen on exam and have worsened since last admission. Pt has been too lethargic and with AMS to eat. Will add nutrition supplements to be consumed if pt improves.   Noted that during last admission pt was followed by palliative care and pt expressed on multiple occasions she would not want a feeding tube, CPR, or intubation.  Will revisit nutrition plan with pt or family depending on mental status if oral intake remains impossible in the coming  days.      Average Meal Intake: 8/25: 0% intake x 1 recorded meal  Nutritionally Relevant Medications: Continuous Infusions:  ceFEPime (MAXIPIME) IV 2 g (04/05/21 2350)   dextrose 5 % and 0.9 % NaCl with KCl 20 mEq/L 75 mL/hr at 04/06/21 0800   PRN Meds: ondansetron   Labs Reviewed  NUTRITION - FOCUSED PHYSICAL EXAM: Flowsheet Row Most Recent Value  Orbital Region Mild depletion  Upper Arm Region No depletion  Thoracic and Lumbar Region No depletion  Buccal Region Mild depletion  Temple Region Severe depletion  Clavicle Bone Region No depletion  Clavicle and Acromion Bone Region No depletion  Scapular Bone Region No depletion  Dorsal Hand Mild depletion  Patellar Region Moderate depletion  Anterior Thigh Region Moderate depletion  Posterior Calf Region Moderate depletion  Edema (RD Assessment) Moderate  [edema present to all extremities]  Hair Reviewed  Eyes Reviewed  Mouth Reviewed  [missing/broken teeth observed]  Skin Reviewed  [scattered bruising, particularly to left lower leg]  Nails Reviewed   Diet Order:   Diet Order             DIET DYS 3 Room service appropriate? Yes; Fluid consistency: Thin  Diet effective now                   EDUCATION NEEDS:  No education needs have been identified at this time  Skin:  Skin Assessment: Skin Integrity Issues: Skin Integrity Issues:: Stage II Stage II: back  Last BM:  8/25  Height:  Ht  Readings from Last 1 Encounters:  04/05/21 5\' 5"  (1.651 m)    Weight:  Wt Readings from Last 1 Encounters:  04/05/21 87.6 kg    Ideal Body Weight:  56.8 kg  BMI:  Body mass index is 32.14 kg/m.  Estimated Nutritional Needs:  Kcal:  1700-1900 kcal/d Protein:  85-95 g/d Fluid:  1.6-1.8 L/d   04/07/21, RD, LDN Clinical Dietitian Pager on Amion

## 2021-04-06 NOTE — Progress Notes (Signed)
Report given to 1A RN Morrie Sheldon. Son [name] informed of transfer to [facility]

## 2021-04-07 ENCOUNTER — Inpatient Hospital Stay: Payer: Medicare Other

## 2021-04-07 DIAGNOSIS — N390 Urinary tract infection, site not specified: Secondary | ICD-10-CM

## 2021-04-07 LAB — BASIC METABOLIC PANEL
Anion gap: 3 — ABNORMAL LOW (ref 5–15)
BUN: 17 mg/dL (ref 8–23)
CO2: 25 mmol/L (ref 22–32)
Calcium: 7.9 mg/dL — ABNORMAL LOW (ref 8.9–10.3)
Chloride: 112 mmol/L — ABNORMAL HIGH (ref 98–111)
Creatinine, Ser: 0.81 mg/dL (ref 0.44–1.00)
GFR, Estimated: >60 mL/min (ref >60)
Glucose, Bld: 98 mg/dL (ref 70–99)
Potassium: 3.4 mmol/L — ABNORMAL LOW (ref 3.5–5.1)
Sodium: 140 mmol/L (ref 135–145)

## 2021-04-07 LAB — MAGNESIUM: Magnesium: 2 mg/dL (ref 1.7–2.4)

## 2021-04-07 LAB — PHOSPHORUS: Phosphorus: 1.7 mg/dL — ABNORMAL LOW (ref 2.5–4.6)

## 2021-04-07 MED ORDER — OXYCODONE-ACETAMINOPHEN 5-325 MG PO TABS
1.0000 | ORAL_TABLET | Freq: Two times a day (BID) | ORAL | Status: DC
  Administered 2021-04-09 – 2021-04-11 (×5): 1 via ORAL
  Filled 2021-04-07 (×7): qty 1

## 2021-04-07 MED ORDER — MORPHINE SULFATE (PF) 2 MG/ML IV SOLN
2.0000 mg | INTRAVENOUS | Status: DC | PRN
Start: 2021-04-07 — End: 2021-04-14

## 2021-04-07 MED ORDER — QUETIAPINE FUMARATE 25 MG PO TABS
25.0000 mg | ORAL_TABLET | Freq: Every day | ORAL | Status: DC
  Administered 2021-04-09 – 2021-04-13 (×5): 25 mg via ORAL
  Filled 2021-04-07 (×5): qty 1

## 2021-04-07 MED ORDER — POTASSIUM PHOSPHATES 15 MMOLE/5ML IV SOLN
30.0000 mmol | Freq: Once | INTRAVENOUS | Status: AC
  Administered 2021-04-07: 30 mmol via INTRAVENOUS
  Filled 2021-04-07: qty 10

## 2021-04-07 NOTE — TOC Initial Note (Signed)
Transition of Care [facility]) - Initial/Assessment Note    Patient Details  Name: Monica [name] MRN: [id number] Date of Birth: [birth year]  Transition of Care [facility]) CM/SW Contact:    Allayne Butcher, RN Phone Number: 04/07/2021, 2:34 PM  Clinical Narrative:                 Patient admitted to the hospital with sepsis, transferred down to the floor from ICU yesterday afternoon.  RNCM was able to speak with patient's son, Monica [name], via phone, patient is disoriented and not speaking at this time.  Monica [name] reports that patient lives at home, his sister the patient's daughter is staying with her and is her caregiver.  Patient has been to [facility], Monica [name] did not seem interested in patient returning to rehab.  Patient is bed bound at home has not been able to walk but PT is supposed to be coming in to do therapy.  Patient has a hospital bed.  [facility] is the home health agency and they are aware of patient's hospital admission.    TOC will cont too follow and assist with discharge.   Expected Discharge Plan: Home w Home Health Services Barriers to Discharge: Continued Medical Work up   Patient Goals and CMS Choice Patient states their goals for this hospitalization and ongoing recovery are:: Patient's son wants to see how she does and improves, likely goal to return home with home health CMS Medicare.gov Compare Post Acute Care list provided to:: Patient Represenative (must comment) Choice offered to / list presented to : Adult Children  Expected Discharge Plan and Services Expected Discharge Plan: Home w Home Health Services   Discharge Planning Services: CM Consult Post Acute Care Choice: Home Health Living arrangements for the past 2 months: Single Family Home                 DME Arranged: N/A DME Agency: NA       HH Arranged: RN, PT, OT, Nurse's Aide HH Agency: [facility] Date HH Agency Contacted: 04/07/21 Time HH Agency Contacted: 1426 Representative spoke with at [facility] Agency:  Voicemail left for [place]  Prior Living Arrangements/Services Living arrangements for the past 2 months: Single Family Home Lives with:: Adult Children (daughter staying with her) Patient language and need for interpreter reviewed:: Yes Do you feel safe going back to the place where you live?: Yes      Need for Family Participation in Patient Care: Yes (Comment) Care giver support system in place?: Yes (comment) (son and daughter) Current home services: DME, Home RN, Home PT (Hospital bed, bedside commode) Criminal Activity/Legal Involvement Pertinent to Current Situation/Hospitalization: No - Comment as needed  Activities of Daily Living Home Assistive Devices/Equipment: None ADL Screening (condition at time of admission) Patient's cognitive ability adequate to safely complete daily activities?: No Is the patient deaf or have difficulty hearing?: No Does the patient have difficulty seeing, even when wearing glasses/contacts?: No Does the patient have difficulty concentrating, remembering, or making decisions?: Yes Patient able to express need for assistance with ADLs?: No Does the patient have difficulty dressing or bathing?: Yes Independently performs ADLs?: No Does the patient have difficulty walking or climbing stairs?: Yes Weakness of Legs: Both Weakness of Arms/Hands: Both  Permission Sought/Granted Permission sought to share information with : Case Manager, Family Supports, Other (comment) Permission granted to share information with : Yes, Verbal Permission Granted  Share Information with NAME: Seleena Reimers  Permission granted to share info w AGENCY: home  health agency Product/process development scientist)  Permission granted to share info w Relationship: son and daughter     Emotional Assessment Appearance:: Appears stated age Attitude/Demeanor/Rapport: Unable to Assess Affect (typically observed): Unable to Assess   Alcohol / Substance Use: Not Applicable Psych Involvement: No  (comment)  Admission diagnosis:  Somnolence [R40.0] Cholecystitis [K81.9] Severe sepsis (HCC) [A41.9, R65.20] Chronic dyspnea [R06.09] Patient Active Problem List   Diagnosis Date Noted   Acute lower UTI 04/07/2021   Pressure injury of skin 04/06/2021   Protein-calorie malnutrition, severe 04/06/2021   Severe sepsis (HCC) 04/04/2021   Hypokalemia 04/04/2021   Hyponatremia 04/04/2021   Acute metabolic encephalopathy 04/04/2021   Constipation due to slow transit 03/14/2021   HFrEF (heart failure with reduced ejection fraction) (HCC)    Non-ST elevation (NSTEMI) myocardial infarction (HCC)    Hypoxia    Shortness of breath    Pre-op evaluation    Abdominal aortic aneurysm (AAA) without rupture (HCC)    Obesity, Class III, BMI 40-49.9 (morbid obesity) (HCC)    Abscess of left hip 01/15/2021   Acute respiratory failure with hypoxia (HCC) 10/05/2020   Severe aortic stenosis    Closed comminuted intertrochanteric fracture of proximal end of left femur (HCC) 10/02/2020   Aortic stenosis    Obesity    Cellulitis of left lower extremity 04/29/2018   GI bleed 06/05/2017   Nausea vomiting and diarrhea    Acute ST elevation myocardial infarction (STEMI) of inferior wall (HCC) 06/02/2017   CAD (coronary artery disease) 2018   PCP:  Wardell Honour, FNP Pharmacy:   [facility] [address] (N), [place] - [address] [place]) [place] [address] Phone: [phone] Fax: [phone]     Social Determinants of Health (SDOH) Interventions    Readmission Risk Interventions Readmission Risk Prevention Plan 04/07/2021  Transportation Screening Complete  PCP or Specialist Appt within 3-5 Days Complete  HRI or Home Care Consult Complete  Social Work Consult for Recovery Care Planning/Counseling Complete  Palliative Care Screening Not Applicable  Medication Review Oceanographer) Complete  Some recent data might be hidden

## 2021-04-07 NOTE — Progress Notes (Signed)
PROGRESS NOTE    Monica [name]  RDE:[id number] DOB: 05-08-1934 DOA: 04/04/2021 PCP: Pcp, No   Chief Complaint.  Altered mental status. Brief Narrative:   Monica [name] is a 87 y.o. female with medical history significant of dementia, coronary artery disease with reported EF of 30 to 35%,, essential hypertension, History of AAA status post endovascular repair, history of GI bleed, hyperlipidemia, morbid obesity, history of aortic stenosis, status post intramedullary nail and multiple cardiac cath brought in today due to altered mental status.  Upon arriving the hospital, patient had abnormal urine, suggesting UTI.  CT chest/abdomen/pelvis suggestive of mild colitis.  Upper quadrant ultrasound did not show any evidence of cholecystitis.  Patient met severe sepsis criteria with tachycardia, tachypnea, significant leukocytosis, elevated lactic acid of 3.0, profound mental status changes, elevated procalcitonin level. Per patient daughter, she just nursing home about 5 days ago, symptoms started 2 days ago with weakness and confusion.  She also had 2 days of diarrhea.  She does not have confusion at baseline, but she is wheelchair-bound. She is placed on cefepime for urinary tract infection while urine culture and blood culture sent out  Assessment & Plan:   Principal Problem:   Severe sepsis ([place]) Active Problems:   CAD (coronary artery disease)   Severe aortic stenosis   Abdominal aortic aneurysm (AAA) without rupture (HCC)   HFrEF (heart failure with reduced ejection fraction) (HCC)   Hypokalemia   Hyponatremia   Acute metabolic encephalopathy   Pressure injury of skin   Protein-calorie malnutrition, severe  #1.  Severe sepsis.  Secondary to UTI. Urinary tract infection. Acute metabolic encephalopathy secondary to UTI. Mild dementia. Patient is more awake, but still totally confused.  Spoke with patient daughter, this not at her baseline.  Blood culture and urine culture so far has no  growth.  She could still have a urinary tract infection, but mental status improvement seem to be slow. She was taking Percocet 5 chronically, I will restart 5 mg every 12 hours in case patient has withdrawal from opioids. Check ammonia level. Also start Seroquel 25 mg every evening to improve her sleep at nighttime. Repeat head CT, initial head CT 3 days ago with without acute changes.  If patient had acute stroke, CT scan might be positive now.  Patient cannot perform an MRI due to that she cannot stay steady. Patient has a poor p.o. intake, I will continue some IV fluids.  Continue antibiotic with cefepime for additional 3 days.  2.  Severe hypokalemia Hyponatremia. Hypophosphatemia. Acute kidney injury. Renal function is better, continue replete potassium and phosphate.  3.  Colitis. Antibiotics.  #4.  Severe aortic stenosis status post TAVR. Coronary artery disease. Stable.  DVT prophylaxis: Heparin Code Status: DNR Family Communication: Daughter updated on the phone. Disposition Plan:    Status is: Inpatient  Remains inpatient appropriate because:IV treatments appropriate due to intensity of illness or inability to take PO and Inpatient level of care appropriate due to severity of illness  Dispo: The patient is from: Home              Anticipated d/c is to: SNF              Patient currently is not medically stable to d/c.   Difficult to place patient No        I/O last 3 completed shifts: In: 2716.3 [I.V.:2381; IV Piggyback:335.4] Out: 985 [Urine:985] No intake/output data recorded.     Consultants:  None  Procedures: None  Antimicrobials: Cefepime  Subjective: More awake today, but still very confused.  She has a poor appetite, I discussed with the nurse, she need to be fed. No short of breath or cough No abdominal pain nausea vomiting No fever chills No dysuria hematuria.  Objective: Vitals:   04/06/21 2351 04/07/21 0448 04/07/21 0841 04/07/21  1148  BP: (!) 152/102 (!) 114/52 (!) 143/80 (!) 146/94  Pulse: 97 (!) 102 (!) 104 92  Resp: 17 17 16 16   Temp: 98.2 F (36.8 C) 98.4 F (36.9 C) 98.6 F (37 C) 98.6 F (37 C)  TempSrc: Oral Oral    SpO2: 100% 96% (!) 88%   Weight:      Height:        Intake/Output Summary (Last 24 hours) at 04/07/2021 1400 Last data filed at 04/07/2021 0601 Gross per 24 hour  Intake 1093.29 ml  Output 505 ml  Net 588.29 ml   Filed Weights   04/04/21 1245 04/05/21 1506  Weight: 93.7 kg 87.6 kg    Examination:  General exam: Frail, ill-appearing. Respiratory system: Clear to auscultation. Respiratory effort normal. Cardiovascular system: S1 & S2 heard, RRR. No JVD, murmurs, rubs, gallops or clicks. No pedal edema. Gastrointestinal system: Abdomen is nondistended, soft and nontender. No organomegaly or masses felt. Normal bowel sounds heard. Central nervous system: Alert and disorientated. no focal neurological deficits. Extremities: Symmetric  Skin: No rashes, lesions or ulcers     Data Reviewed: I have personally reviewed following labs and imaging studies  CBC: Recent Labs  Lab 04/04/21 1422 04/05/21 0651 04/06/21 0504  WBC 9.0 21.1* 16.6*  NEUTROABS  --   --  13.6*  HGB 15.7* 14.2 13.6  HCT 45.2 40.9 41.7  MCV 79.9* 81.0 84.8  PLT 243 221 456   Basic Metabolic Panel: Recent Labs  Lab 04/04/21 1422 04/04/21 1608 04/05/21 0651 04/05/21 1507 04/06/21 0504 04/07/21 0437  NA 130*  --  132*  --  137 140  K 2.5*  --  2.5* 3.1* 4.3 3.4*  CL 89*  --  94*  --  104 112*  CO2 28  --  27  --  28 25  GLUCOSE 130*  --  160*  --  107* 98  BUN 11  --  11  --  17 17  CREATININE 1.11*  --  1.06*  --  0.98 0.81  CALCIUM 8.5*  --  8.1*  --  7.9* 7.9*  MG  --  1.8 2.0  --  2.2 2.0  PHOS  --   --   --   --   --  1.7*   GFR: Estimated Creatinine Clearance: 55.5 mL/min (by C-G formula based on SCr of 0.81 mg/dL). Liver Function Tests: Recent Labs  Lab 04/04/21 1422 04/05/21 0651   AST 29 25  ALT 23 17  ALKPHOS 193* 148*  BILITOT 3.5* 2.9*  PROT 7.2 6.2*  ALBUMIN 2.7* 2.2*   No results for input(s): LIPASE, AMYLASE in the last 168 hours. No results for input(s): AMMONIA in the last 168 hours. Coagulation Profile: Recent Labs  Lab 04/05/21 0651  INR 1.2   Cardiac Enzymes: No results for input(s): CKTOTAL, CKMB, CKMBINDEX, TROPONINI in the last 168 hours. BNP (last 3 results) No results for input(s): PROBNP in the last 8760 hours. HbA1C: No results for input(s): HGBA1C in the last 72 hours. CBG: Recent Labs  Lab 04/05/21 1503 04/06/21 2059  GLUCAP 110* 98   Lipid Profile: No results for input(s):  CHOL, HDL, LDLCALC, TRIG, CHOLHDL, LDLDIRECT in the last 72 hours. Thyroid Function Tests: No results for input(s): TSH, T4TOTAL, FREET4, T3FREE, THYROIDAB in the last 72 hours. Anemia Panel: No results for input(s): VITAMINB12, FOLATE, FERRITIN, TIBC, IRON, RETICCTPCT in the last 72 hours. Sepsis Labs: Recent Labs  Lab 04/04/21 1735 04/04/21 2211 04/05/21 0651  PROCALCITON  --   --  0.51  LATICACIDVEN 3.0* 2.4*  --     Recent Results (from the past 240 hour(s))  Resp Panel by RT-PCR (Flu A&B, Covid) Nasopharyngeal Swab     Status: None   Collection Time: 04/04/21  4:36 PM   Specimen: Nasopharyngeal Swab; Nasopharyngeal(NP) swabs in vial transport medium  Result Value Ref Range Status   SARS Coronavirus 2 by RT PCR NEGATIVE NEGATIVE Final    Comment: (NOTE) SARS-CoV-2 target nucleic acids are NOT DETECTED.  The SARS-CoV-2 RNA is generally detectable in upper respiratory specimens during the acute phase of infection. The lowest concentration of SARS-CoV-2 viral copies this assay can detect is 138 copies/mL. A negative result does not preclude SARS-Cov-2 infection and should not be used as the sole basis for treatment or other patient management decisions. A negative result may occur with  improper specimen collection/handling, submission of  specimen other than nasopharyngeal swab, presence of viral mutation(s) within the areas targeted by this assay, and inadequate number of viral copies(<138 copies/mL). A negative result must be combined with clinical observations, patient history, and epidemiological information. The expected result is Negative.  Fact Sheet for Patients:  EntrepreneurPulse.com.au  Fact Sheet for Healthcare Providers:  IncredibleEmployment.be  This test is no t yet approved or cleared by the [place] FDA and  has been authorized for detection and/or diagnosis of SARS-CoV-2 by FDA under an Emergency Use Authorization (EUA). This EUA will remain  in effect (meaning this test can be used) for the duration of the COVID-19 declaration under Section 564(b)(1) of the Act, 21 U.S.C.section 360bbb-3(b)(1), unless the authorization is terminated  or revoked sooner.       Influenza A by PCR NEGATIVE NEGATIVE Final   Influenza B by PCR NEGATIVE NEGATIVE Final    Comment: (NOTE) The Xpert Xpress SARS-CoV-2/FLU/RSV plus assay is intended as an aid in the diagnosis of influenza from Nasopharyngeal swab specimens and should not be used as a sole basis for treatment. Nasal washings and aspirates are unacceptable for Xpert Xpress SARS-CoV-2/FLU/RSV testing.  Fact Sheet for Patients: EntrepreneurPulse.com.au  Fact Sheet for Healthcare Providers: IncredibleEmployment.be  This test is not yet approved or cleared by the [place] FDA and has been authorized for detection and/or diagnosis of SARS-CoV-2 by FDA under an Emergency Use Authorization (EUA). This EUA will remain in effect (meaning this test can be used) for the duration of the COVID-19 declaration under Section 564(b)(1) of the Act, 21 U.S.C. section 360bbb-3(b)(1), unless the authorization is terminated or revoked.  Performed at [facility], [address] [place], [place] [address]   Urine Culture     Status: None   Collection Time: 04/04/21  4:36 PM   Specimen: Urine, Random  Result Value Ref Range Status   Specimen Description   Final    URINE, RANDOM Performed at [facility], [address], [place], [place] [address]    Special Requests   Final    NONE Performed at [facility], [address] [place], [place] [address]    Culture   Final    NO GROWTH Performed at [facility], [place] [address] [place], [place] [address]    Report Status 04/06/2021 FINAL  Final  Blood culture (routine x 2)     Status: None (Preliminary result)   Collection Time: 04/04/21  5:36 PM   Specimen: BLOOD  Result Value Ref Range Status   Specimen Description BLOOD BLOOD LEFT HAND  Final   Special Requests   Final    BOTTLES DRAWN AEROBIC ONLY Blood Culture adequate volume   Culture   Final    NO GROWTH 3 DAYS Performed at [facility], [address], [place], [place] [address]    Report Status PENDING  Incomplete  Blood culture (routine x 2)     Status: None (Preliminary result)   Collection Time: 04/04/21  5:38 PM   Specimen: BLOOD  Result Value Ref Range Status   Specimen Description BLOOD RIGHT ANTECUBITAL  Final   Special Requests   Final    BOTTLES DRAWN AEROBIC AND ANAEROBIC Blood Culture results may not be optimal due to an inadequate volume of blood received in culture bottles   Culture   Final    NO GROWTH 3 DAYS Performed at [facility], [address] [place], [place] [address]    Report Status PENDING  Incomplete  MRSA Next Gen by PCR, Nasal     Status: None   Collection Time: 04/06/21  9:25 AM   Specimen: Nasal Mucosa; Nasal Swab  Result Value Ref Range Status   MRSA by PCR Next Gen NOT DETECTED NOT DETECTED Final    Comment: (NOTE) The GeneXpert MRSA Assay (FDA approved for NASAL specimens only), is one component of a comprehensive MRSA colonization surveillance program. It  is not intended to diagnose MRSA infection nor to guide or monitor treatment for MRSA infections. Test performance is not FDA approved in patients less than 1 years old. Performed at [facility], [address], [place], [place] [address]          Radiology Studies: No results found.      Scheduled Meds:  chlorhexidine  15 mL Mouth Rinse BID   Chlorhexidine Gluconate Cloth  6 each Topical Daily   Chlorhexidine Gluconate Cloth  6 each Topical Q0600   feeding supplement  237 mL Oral BID BM   heparin  5,000 Units Subcutaneous Q8H   mouth rinse  15 mL Mouth Rinse q12n4p   mupirocin ointment  1 application Nasal BID   Continuous Infusions:  ceFEPime (MAXIPIME) IV Stopped (04/07/21 0042)   dextrose 5 % and 0.9 % NaCl with KCl 20 mEq/L 75 mL/hr at 04/07/21 0131   potassium PHOSPHATE IVPB (in mmol) 30 mmol (04/07/21 1112)     LOS: 3 days    Time spent: 32 minutes, more than 50% time spent on direct patient care.    Sharen Hones, MD [facility]   To contact the attending provider between 7A-7P or the covering provider during after hours 7P-7A, please log into the web site [email or web address] and access using universal [place] password for that web site. If you do not have the password, please call the hospital operator.  04/07/2021, 2:00 PM

## 2021-04-08 ENCOUNTER — Inpatient Hospital Stay: Payer: Medicare Other

## 2021-04-08 DIAGNOSIS — I63532 Cerebral infarction due to unspecified occlusion or stenosis of left posterior cerebral artery: Secondary | ICD-10-CM

## 2021-04-08 LAB — MAGNESIUM: Magnesium: 1.9 mg/dL (ref 1.7–2.4)

## 2021-04-08 LAB — CBC WITH DIFFERENTIAL/PLATELET
Abs Immature Granulocytes: 0.02 10*3/uL (ref 0.00–0.07)
Basophils Absolute: 0 10*3/uL (ref 0.0–0.1)
Basophils Relative: 0 %
Eosinophils Absolute: 0.8 10*3/uL — ABNORMAL HIGH (ref 0.0–0.5)
Eosinophils Relative: 10 %
HCT: 36 % (ref 36.0–46.0)
Hemoglobin: 11.8 g/dL — ABNORMAL LOW (ref 12.0–15.0)
Immature Granulocytes: 0 %
Lymphocytes Relative: 16 %
Lymphs Abs: 1.4 10*3/uL (ref 0.7–4.0)
MCH: 27.4 pg (ref 26.0–34.0)
MCHC: 32.8 g/dL (ref 30.0–36.0)
MCV: 83.7 fL (ref 80.0–100.0)
Monocytes Absolute: 0.5 10*3/uL (ref 0.1–1.0)
Monocytes Relative: 6 %
Neutro Abs: 5.8 10*3/uL (ref 1.7–7.7)
Neutrophils Relative %: 68 %
Platelets: 211 10*3/uL (ref 150–400)
RBC: 4.3 MIL/uL (ref 3.87–5.11)
RDW: 21.7 % — ABNORMAL HIGH (ref 11.5–15.5)
WBC: 8.5 10*3/uL (ref 4.0–10.5)
nRBC: 0 % (ref 0.0–0.2)

## 2021-04-08 LAB — BASIC METABOLIC PANEL
Anion gap: 5 (ref 5–15)
BUN: 15 mg/dL (ref 8–23)
CO2: 25 mmol/L (ref 22–32)
Calcium: 8 mg/dL — ABNORMAL LOW (ref 8.9–10.3)
Chloride: 112 mmol/L — ABNORMAL HIGH (ref 98–111)
Creatinine, Ser: 0.76 mg/dL (ref 0.44–1.00)
GFR, Estimated: >60 mL/min (ref >60)
Glucose, Bld: 86 mg/dL (ref 70–99)
Potassium: 3.7 mmol/L (ref 3.5–5.1)
Sodium: 142 mmol/L (ref 135–145)

## 2021-04-08 LAB — TSH: TSH: 6.714 u[IU]/mL — ABNORMAL HIGH (ref 0.350–4.500)

## 2021-04-08 LAB — PHOSPHORUS: Phosphorus: 2.6 mg/dL (ref 2.5–4.6)

## 2021-04-08 LAB — AMMONIA: Ammonia: 21 umol/L (ref 9–35)

## 2021-04-08 MED ORDER — GADOBUTROL 1 MMOL/ML IV SOLN
7.5000 mL | Freq: Once | INTRAVENOUS | Status: AC | PRN
  Administered 2021-04-08: 7.5 mL via INTRAVENOUS

## 2021-04-08 MED ORDER — ASPIRIN 300 MG RE SUPP
300.0000 mg | Freq: Every day | RECTAL | Status: DC
  Administered 2021-04-08 – 2021-04-09 (×2): 300 mg via RECTAL
  Filled 2021-04-08 (×2): qty 1

## 2021-04-08 MED ORDER — ASPIRIN 325 MG PO TABS
325.0000 mg | ORAL_TABLET | Freq: Every day | ORAL | Status: DC
  Administered 2021-04-10 – 2021-04-11 (×2): 325 mg via ORAL
  Filled 2021-04-08 (×6): qty 1

## 2021-04-08 MED ORDER — SODIUM CHLORIDE 0.9 % IV SOLN
1.0000 g | INTRAVENOUS | Status: DC
  Administered 2021-04-08: 1 g via INTRAVENOUS
  Filled 2021-04-08: qty 10

## 2021-04-08 MED ORDER — STROKE: EARLY STAGES OF RECOVERY BOOK: Freq: Once | Status: AC

## 2021-04-08 NOTE — Progress Notes (Signed)
PROGRESS NOTE    Monica [name]  ZJI:[id number] DOB: Dec 24, 1933 DOA: 04/04/2021 PCP: Cephas Darby, FNP   Chief complaint.  Altered mental status. Brief Narrative:  Monica [name] is a 87 y.o. female with medical history significant of dementia, coronary artery disease with reported EF of 30 to 35%,, essential hypertension, History of AAA status post endovascular repair, history of GI bleed, hyperlipidemia, morbid obesity, history of aortic stenosis, status post intramedullary nail and multiple cardiac cath brought in today due to altered mental status.  Upon arriving the hospital, patient had abnormal urine, suggesting UTI.  CT chest/abdomen/pelvis suggestive of mild colitis.  Upper quadrant ultrasound did not show any evidence of cholecystitis.  Patient met severe sepsis criteria with tachycardia, tachypnea, significant leukocytosis, elevated lactic acid of 3.0, profound mental status changes, elevated procalcitonin level. Per patient daughter, she just nursing home about 5 days ago, symptoms started 2 days ago with weakness and confusion.  She also had 2 days of diarrhea.  She does not have confusion at baseline, but she is wheelchair-bound. She is placed on cefepime for urinary tract infection while urine culture and blood culture are negative. Due to persistent mental status changes, CT and MRI brain was performed, showed multiple strokes.   Assessment & Plan:   Principal Problem:   Severe sepsis ([facility]) Active Problems:   CAD (coronary artery disease)   Severe aortic stenosis   Abdominal aortic aneurysm (AAA) without rupture (HCC)   HFrEF (heart failure with reduced ejection fraction) (HCC)   Hypokalemia   Hyponatremia   Acute metabolic encephalopathy   Pressure injury of skin   Protein-calorie malnutrition, severe   Acute lower UTI  Severe sepsis.  Secondary to UTI. Urinary tract infection. Colitis. Patient condition has stabilized.  So far blood culture and urine cultures  negative.  Will Discontinue antibiotics.  Acute metabolic encephalopathy. Left MCA/PCA stroke. Mild dementia. Due to persistent altered mental status, CT scan of the head was performed, showed possible stroke on 8/27.  MRI performed today confirmed it. Neurology consult is obtained. Had an long discussion with patient daughter, patient is still unresponsive, not able to eat.  Will need a feeding tube to sustain her life.  She was talked with her brother about that, they will make a decision about tube feeding versus comfort care.   Severe hypokalemia Hyponatremia. Hypophosphatemia. Acute kidney injury. Condition is improving.  Severe aortic stenosis status post TAVR. Coronary artery disease.     DVT prophylaxis: Heparin Code Status: DNR Family Communication: Daughter updated. Disposition Plan:    Status is: Inpatient  Remains inpatient appropriate because:Altered mental status and Inpatient level of care appropriate due to severity of illness  Dispo: The patient is from: Home              Anticipated d/c is to: Pending              Patient currently is not medically stable to d/c.   Difficult to place patient No        I/O last 3 completed shifts: In: 2213.4 [I.V.:1553.4; IV Piggyback:660] Out: 730 [Urine:730] No intake/output data recorded.     Consultants:  Neurology  Procedures: None  Antimicrobials: None  Subjective: Patient is still unresponsive, she opens eyes only.  Not able to eat. Does not seem to have any short of breath or cough. No fever or chills. No abdominal pain, no nausea vomiting.  Objective: Vitals:   04/07/21 2132 04/08/21 0053 04/08/21 0451 04/08/21 0853  BP: Marland Kitchen)  154/73 136/83 (!) 118/56 (!) 153/78  Pulse: 95 92 95 92  Resp: _0 Temp: 98.3 F (36.8 C) 98.5 F (36.9 C) 98.6 F (37 C) 98.6 F (37 C)  TempSrc:      SpO2: 100% 97% 96% 99%  Weight:      Height:        Intake/Output Summary (Last 24 hours) at  04/08/2021 1342 Last data filed at 04/08/2021 0400 Gross per 24 hour  Intake 1582.34 ml  Output 450 ml  Net 1132.34 ml   Filed Weights   04/04/21 1245 04/05/21 1506  Weight: 93.7 kg 87.6 kg    Examination:  General exam: Appears calm and comfortable  Respiratory system: Clear to auscultation. Respiratory effort normal. Cardiovascular system: S1 & S2 heard, RRR. 2/6 SM at RUSB. No pedal edema. Gastrointestinal system: Abdomen is nondistended, soft and nontender. No organomegaly or masses felt. Normal bowel sounds heard. Central nervous system: Obtunded, opening eyes only. Extremities: Symmetric  Skin: No rashes, lesions or ulcers     Data Reviewed: I have personally reviewed following labs and imaging studies  CBC: Recent Labs  Lab 04/04/21 1422 04/05/21 0651 04/06/21 0504 04/08/21 0442  WBC 9.0 21.1* 16.6* 8.5  NEUTROABS  --   --  13.6* 5.8  HGB 15.7* 14.2 13.6 11.8*  HCT 45.2 40.9 41.7 36.0  MCV 79.9* 81.0 84.8 83.7  PLT 243 221 246 208   Basic Metabolic Panel: Recent Labs  Lab 04/04/21 1422 04/04/21 1608 04/05/21 0651 04/05/21 1507 04/06/21 0504 04/07/21 0437 04/08/21 0442  NA 130*  --  132*  --  137 140 142  K 2.5*  --  2.5* 3.1* 4.3 3.4* 3.7  CL 89*  --  94*  --  104 112* 112*  CO2 28  --  27  --  _1 GLUCOSE 130*  --  160*  --  107* 98 86  BUN 11  --  11  --  _2 CREATININE 1.11*  --  1.06*  --  0.98 0.81 0.76  CALCIUM 8.5*  --  8.1*  --  7.9* 7.9* 8.0*  MG  --  1.8 2.0  --  2.2 2.0 1.9  PHOS  --   --   --   --   --  1.7* 2.6   GFR: Estimated Creatinine Clearance: 56.2 mL/min (by C-G formula based on SCr of 0.76 mg/dL). Liver Function Tests: Recent Labs  Lab 04/04/21 1422 04/05/21 0651  AST 29 25  ALT 23 17  ALKPHOS 193* 148*  BILITOT 3.5* 2.9*  PROT 7.2 6.2*  ALBUMIN 2.7* 2.2*   No results for input(s): LIPASE, AMYLASE in the last 168 hours. Recent Labs  Lab 04/08/21 0442  AMMONIA 21   Coagulation Profile: Recent Labs   Lab 04/05/21 0651  INR 1.2   Cardiac Enzymes: No results for input(s): CKTOTAL, CKMB, CKMBINDEX, TROPONINI in the last 168 hours. BNP (last 3 results) No results for input(s): PROBNP in the last 8760 hours. HbA1C: No results for input(s): HGBA1C in the last 72 hours. CBG: Recent Labs  Lab 04/05/21 1503 04/06/21 2059  GLUCAP 110* 98   Lipid Profile: No results for input(s): CHOL, HDL, LDLCALC, TRIG, CHOLHDL, LDLDIRECT in the last 72 hours. Thyroid Function Tests: Recent Labs    04/08/21 0442  TSH 6.714*   Anemia Panel: No results for input(s): VITAMINB12, FOLATE, FERRITIN, TIBC, IRON, RETICCTPCT in the last 72 hours. Sepsis Labs: Recent  Labs  Lab 04/04/21 1735 04/04/21 2211 04/05/21 0651  PROCALCITON  --   --  0.51  LATICACIDVEN 3.0* 2.4*  --     Recent Results (from the past 240 hour(s))  Resp Panel by RT-PCR (Flu A&B, Covid) Nasopharyngeal Swab     Status: None   Collection Time: 04/04/21  4:36 PM   Specimen: Nasopharyngeal Swab; Nasopharyngeal(NP) swabs in vial transport medium  Result Value Ref Range Status   SARS Coronavirus 2 by RT PCR NEGATIVE NEGATIVE Final    Comment: (NOTE) SARS-CoV-2 target nucleic acids are NOT DETECTED.  The SARS-CoV-2 RNA is generally detectable in upper respiratory specimens during the acute phase of infection. The lowest concentration of SARS-CoV-2 viral copies this assay can detect is 138 copies/mL. A negative result does not preclude SARS-Cov-2 infection and should not be used as the sole basis for treatment or other patient management decisions. A negative result may occur with  improper specimen collection/handling, submission of specimen other than nasopharyngeal swab, presence of viral mutation(s) within the areas targeted by this assay, and inadequate number of viral copies(<138 copies/mL). A negative result must be combined with clinical observations, patient history, and epidemiological information. The expected  result is Negative.  Fact Sheet for Patients:  EntrepreneurPulse.com.au  Fact Sheet for Healthcare Providers:  IncredibleEmployment.be  This test is no t yet approved or cleared by the [place] FDA and  has been authorized for detection and/or diagnosis of SARS-CoV-2 by FDA under an Emergency Use Authorization (EUA). This EUA will remain  in effect (meaning this test can be used) for the duration of the COVID-19 declaration under Section 564(b)(1) of the Act, 21 U.S.C.section 360bbb-3(b)(1), unless the authorization is terminated  or revoked sooner.       Influenza A by PCR NEGATIVE NEGATIVE Final   Influenza B by PCR NEGATIVE NEGATIVE Final    Comment: (NOTE) The Xpert Xpress SARS-CoV-2/FLU/RSV plus assay is intended as an aid in the diagnosis of influenza from Nasopharyngeal swab specimens and should not be used as a sole basis for treatment. Nasal washings and aspirates are unacceptable for Xpert Xpress SARS-CoV-2/FLU/RSV testing.  Fact Sheet for Patients: EntrepreneurPulse.com.au  Fact Sheet for Healthcare Providers: IncredibleEmployment.be  This test is not yet approved or cleared by the [place] FDA and has been authorized for detection and/or diagnosis of SARS-CoV-2 by FDA under an Emergency Use Authorization (EUA). This EUA will remain in effect (meaning this test can be used) for the duration of the COVID-19 declaration under Section 564(b)(1) of the Act, 21 U.S.C. section 360bbb-3(b)(1), unless the authorization is terminated or revoked.  Performed at [facility], [address] [place], [place] [address]   Urine Culture     Status: None   Collection Time: 04/04/21  4:36 PM   Specimen: Urine, Random  Result Value Ref Range Status   Specimen Description   Final    URINE, RANDOM Performed at [facility], [address] [place], [place] [address]     Special Requests   Final    NONE Performed at [facility], [address] [place], [place] [address]    Culture   Final    NO GROWTH Performed at [facility], [place] [address] [place], [place] [address]    Report Status 04/06/2021 FINAL  Final  Blood culture (routine x 2)     Status: None (Preliminary result)   Collection Time: 04/04/21  5:36 PM   Specimen: BLOOD  Result Value  Ref Range Status   Specimen Description BLOOD BLOOD LEFT HAND  Final   Special Requests   Final    BOTTLES DRAWN AEROBIC ONLY Blood Culture adequate volume   Culture   Final    NO GROWTH 4 DAYS Performed at [facility], [address], [place], [place] [address]    Report Status PENDING  Incomplete  Blood culture (routine x 2)     Status: None (Preliminary result)   Collection Time: 04/04/21  5:38 PM   Specimen: BLOOD  Result Value Ref Range Status   Specimen Description BLOOD RIGHT ANTECUBITAL  Final   Special Requests   Final    BOTTLES DRAWN AEROBIC AND ANAEROBIC Blood Culture results may not be optimal due to an inadequate volume of blood received in culture bottles   Culture   Final    NO GROWTH 4 DAYS Performed at [facility], [address], [place], [place] [address]    Report Status PENDING  Incomplete  MRSA Next Gen by PCR, Nasal     Status: None   Collection Time: 04/06/21  9:25 AM   Specimen: Nasal Mucosa; Nasal Swab  Result Value Ref Range Status   MRSA by PCR Next Gen NOT DETECTED NOT DETECTED Final    Comment: (NOTE) The GeneXpert MRSA Assay (FDA approved for NASAL specimens only), is one component of a comprehensive MRSA colonization surveillance program. It is not intended to diagnose MRSA infection nor to guide or monitor treatment for MRSA infections. Test performance is not FDA approved in patients less than 73 years old. Performed at [facility], [address] [place],  [address]          Radiology Studies: CT  HEAD WO CONTRAST (5MM)  Result Date: 04/07/2021 CLINICAL DATA:  Delirium.  Fall. EXAM: CT HEAD WITHOUT CONTRAST TECHNIQUE: Contiguous axial images were obtained from the base of the skull through the vertex without intravenous contrast. COMPARISON:  04/04/2021 FINDINGS: Brain: Confluent hypodensities throughout the cerebral white matter similar to the prior CT and are nonspecific but compatible with severe chronic small vessel ischemic disease. There is also heterogeneous hypodensity in the thalami and left lentiform nucleus with the latter potentially having increased from the prior CT. There is hypodensity involving cortex and subcortical white matter in the left occipital lobe with milder changes in the right occipital lobe. There is also a 1 cm rounded hypodensity in the left cerebellar hemisphere which could reflect an infarct or artifact and which was not clearly present on the prior CT. No acute intracranial hemorrhage, mass, midline shift, or extra-axial fluid collection is identified. Mild ventriculomegaly is unchanged and favored to reflect central predominant cerebral atrophy. Vascular: Calcified atherosclerosis at the skull base. No hyperdense vessel. Skull: No fracture or suspicious osseous lesion. Sinuses/Orbits: Paranasal sinuses and mastoid air cells are clear. Unremarkable orbits. Other: None. IMPRESSION: 1. Cortical and subcortical hypodensities in the left greater than right occipital lobes which are indeterminate but could reflect recent infarcts or posterior reversible encephalopathy syndrome (PRES). Possible involvement of the left basal ganglia and left cerebellum as well. An MRI is recommended for further evaluation when the patient's clinical status allows. 2. Severe chronic small vessel ischemic disease. Electronically Signed   By: Logan Bores M.D.   On: 04/07/2021 15:58   MR ANGIO HEAD WO CONTRAST  Result Date: 04/08/2021 CLINICAL DATA:  87 year old female with abnormal but  nonspecific hemispheric hypodensity on head CT yesterday for delirium. Acute left MCA/PCA watershed area  ischemia. EXAM: MRA HEAD WITHOUT CONTRAST TECHNIQUE: Angiographic images of the Circle of Willis were acquired using MRA technique without intravenous contrast. COMPARISON:  Noncontrast brain MRI and MRA neck without and with contrast today reported separately. FINDINGS: Intermittently degraded by motion. Antegrade flow in the posterior circulation. Fairly codominant distal vertebral arteries. Both distal vertebral arteries appear irregular, but patent to the vertebrobasilar junction and without high-grade stenosis. Both PICA origins are patent. Patent basilar artery with mid basilar artery irregularity and mild stenosis. SCA and right PCA origins are patent. There is a fetal type left PCA origin. The right posterior communicating artery is diminutive or absent. Visible right PCA branches are within normal limits. There is moderate irregularity and stenosis of the left PCA P2 segment (series 1066, image 15) but preserved distal left PCA flow signal. Antegrade flow in both ICA siphons. Limited cavernous sinus siphon detail due to motion. Distal ICAs and carotid termini are patent. MCA and ACA origins are patent. Limited ACA detail at the anterior communicating and proximal A2 segments due to motion, but those segments appear grossly normal on the postcontrast neck MRA series today. And the distal A2 segments appear patent. Better detail of the MCA M1 segments and MCA bifurcations which are patent. However, MCA branch detail is degraded by motion. No proximal M2 branch occlusion is evident. IMPRESSION: 1. Intermittently motion degraded. Negative for large vessel occlusion. Positive for moderate stenosis of the Left PCA P2 segment, and no fetal type origin of the left PCA. 2. Evidence of distal vertebral artery and basilar artery atherosclerosis without significant stenosis. Electronically Signed   By: Genevie Ann M.D.    On: 04/08/2021 11:34   MR ANGIO NECK W WO CONTRAST  Result Date: 04/08/2021 CLINICAL DATA:  87 year old female with abnormal but nonspecific hemispheric hypodensity on head CT yesterday for delirium. Acute left MCA/PCA watershed area ischemia. EXAM: MRA NECK WITHOUT AND WITH CONTRAST TECHNIQUE: Multiplanar and multiecho pulse sequences of the neck were obtained without and with intravenous contrast. Angiographic images of the neck were obtained using MRA technique without and with intravenous contrast. CONTRAST:  7.61m GADAVIST GADOBUTROL 1 MMOL/ML IV SOLN COMPARISON:  Brain MRI today reported separately. FINDINGS: Precontrast time-of-flight images demonstrate antegrade flow signal in the bilateral cervical carotid and vertebral arteries. Antegrade flow continues toward the skull base. Following contrast a 3 vessel arch configuration is demonstrated, although the postcontrast portion of the exam is mildly degraded by motion and also venous contamination. Arch and great vessel origins are within normal limits. Tortuous right CCA. Patent right carotid bifurcation with only mild irregularity at the right ICA origin. No cervical right ICA stenosis. Tortuous left CCA. Patent left carotid bifurcation with mild irregularity of the proximal left ICA, no significant stenosis. Proximal subclavian arteries appear patent without stenosis. Neither vertebral artery origin is well demonstrated due to motion artifact. But there is symmetric appearing enhancement of both vertebral arteries in the neck and to the vertebrobasilar junction. Distally the left vertebral artery appears to be dominant. IMPRESSION: No evidence of hemodynamically significant cervical carotid or vertebral artery stenosis. Electronically Signed   By: HGenevie AnnM.D.   On: 04/08/2021 11:29   MR BRAIN WO CONTRAST  Result Date: 04/08/2021 CLINICAL DATA:  87year old female with abnormal but nonspecific hemispheric hypodensity on head CT yesterday for  delirium. Questioned ischemia versus posterior reversible encephalopathy syndrome (PRES). EXAM: MRI HEAD WITHOUT CONTRAST TECHNIQUE: Multiplanar, multiecho pulse sequences of the brain and surrounding structures were obtained without intravenous contrast. COMPARISON:  Head CT 04/07/2021 and earlier. FINDINGS: Brain: Patchy restricted diffusion in the inferior left parietal lobe and occipital lobe corresponds to left MCA/PCA watershed territory. An area of 4-5 cm is affected. There is associated T2 and FLAIR hyperintensity there compatible with cytotoxic edema. But no other convincing cortical edema. No other restricted diffusion. Confluent bilateral cerebral white matter T2 and FLAIR hyperintensity with bilateral deep white matter capsule involvement. Left greater than right T2 and FLAIR heterogeneity in the bilateral deep gray matter nuclei. Moderate T2 and FLAIR heterogeneity throughout the pons. Small chronic lacunar infarct of the right cerebellum on series 7, image 5. No chronic cortical encephalomalacia identified. No midline shift, mass effect, evidence of mass lesion, ventriculomegaly, extra-axial collection or acute intracranial hemorrhage. Cervicomedullary junction and pituitary are within normal limits. Vascular: Major intracranial vascular flow voids are preserved. MRA today is reported separately. Skull and upper cervical spine: Partially visible cervical spine degeneration. Visualized bone marrow signal is within normal limits. Sinuses/Orbits: Negative. Paranasal sinuses and mastoids are stable and well aerated. Other: Negative visible scalp and face. IMPRESSION: 1. Patchy acute infarcts in the Left MCA/PCA watershed territory. No associated hemorrhage or mass effect. 2. No other acute intracranial abnormality. Advanced chronic signal changes in the bilateral cerebral white matter, deep gray nuclei, and pons. This is nonspecific but favor due to chronic small vessel disease. 3. MRA today is reported  separately. Electronically Signed   By: Genevie Ann M.D.   On: 04/08/2021 11:26        Scheduled Meds:   stroke: mapping our early stages of recovery book   Does not apply Once   aspirin  300 mg Rectal Daily   Or   aspirin  325 mg Oral Daily   chlorhexidine  15 mL Mouth Rinse BID   Chlorhexidine Gluconate Cloth  6 each Topical Daily   Chlorhexidine Gluconate Cloth  6 each Topical Q0600   feeding supplement  237 mL Oral BID BM   heparin  5,000 Units Subcutaneous Q8H   mouth rinse  15 mL Mouth Rinse q12n4p   mupirocin ointment  1 application Nasal BID   oxyCODONE-acetaminophen  1 tablet Oral Q12H   QUEtiapine  25 mg Oral QHS   Continuous Infusions:  dextrose 5 % and 0.9 % NaCl with KCl 20 mEq/L 50 mL/hr at 04/07/21 1507     LOS: 4 days    Time spent: 34 minutes, more than 50% time involved in direct patient care.    Sharen Hones, MD [facility]   To contact the attending provider between 7A-7P or the covering provider during after hours 7P-7A, please log into the web site [email or web address] and access using universal [place] password for that web site. If you do not have the password, please call the hospital operator.  04/08/2021, 1:42 PM

## 2021-04-08 NOTE — Evaluation (Signed)
Physical Therapy Evaluation Patient Details Name: Monica [name] MRN: [id number] DOB: 20-Feb-1934 Today's Date: 04/08/2021   History of Present Illness  87 y.o. female presented to ED with AMS. Pt dx with severe sepsis. Imaging from CT reveals multiple subacute infarcts - cortical and subcortical hypodensities in left greater than right occipital lobes with possible basal ganglia and cerebellar involvement. Medical history significant of dementia, coronary artery disease with reported EF of 30 to 35%, essential hypertension, history of AAA status post endovascular repair, history of GI bleed, hyperlipidemia, morbid obesity, history of aortic stenosis, s/p intramedullary nail and multiple cardiac cath.  Clinical Impression  Pt received supine in bed, awake and alert with grandson in room. Pt attentive to grandson sitting to her left. Pt comes from home with her daughter as primary caregiver. Per grandson, pt is total assist and bed-level at baseline since 2/22 when pt fx L hip. She just returned home from SNF 5 days prior to hospital arrival. If pt has to leave home, family calls non-emergent EMS for transport. Pt was able to demo trace activation of RUE with minimal wrist flexion, supination and elbow flexion. There was also some active shoulder flexion as PT was assessing ROM. Limited strength and ROM in BLE; left weaker than right. Difficulty assessing sensation and coordination due to difficulty processing commands although she did say "oww" with deep pressure to R dorsal forearm. Pt began to verbalize short phrases during evaluation including her name and "I love you" to her grandson. She also verbalized a purposeful "okay" on multiple occasions. Pt was total assist to roll for pressure relief. Will continue to follow to attempt improved strength in BUE to allow return to baseline and provide caregiver education/support as needed. Would benefit from skilled PT to address above deficits and promote optimal  return to PLOF.     Follow Up Recommendations SNF;Supervision/Assistance - 24 hour (has been to [facility])    Equipment Recommendations  None recommended by PT    Recommendations for Other Services       Precautions / Restrictions Precautions Precautions: Fall Restrictions Weight Bearing Restrictions: No Other Position/Activity Restrictions: bed level mobility at baseline      Mobility  Bed Mobility Overal bed mobility: Needs Assistance Bed Mobility: Rolling Rolling: Total assist;+2 for physical assistance         General bed mobility comments: rolled to R for pressure relief - grandson placed pillows to maintain weight shift. Did not achieve full side lying due to pt discomfort.    Transfers                 General transfer comment: deferred - does not perform at baseline  Ambulation/Gait             General Gait Details: deferred - does not perform at baseline  Stairs            Wheelchair Mobility    Modified Rankin (Stroke Patients Only)       Balance Overall balance assessment:  (deferred - bed level at baseline)                                           Pertinent Vitals/Pain Pain Assessment: Faces Faces Pain Scale: No hurt    Home Living Family/patient expects to be discharged to:: Private residence Living Arrangements: Children Available Help at Discharge: Family;Available 24 hours/day Type of Home:  House Home Access: Stairs to enter   [employer] of Steps: 3 Home Layout: One level Home Equipment: Walker - 2 wheels;Cane - single point;Bedside commode;Hospital bed      Prior Function Level of Independence: Needs assistance   Gait / Transfers Assistance Needed: Ambulatory prior to L hip fx and surgery in Feb 2022. Has not ambulated since. Bed level at baseline. Does not transfer to w/c. If pt leaves home, non-emergent EMS is called to transport.  ADL's / Homemaking Assistance Needed: Pt daughter  performs total assist with ADLs, IADLs and homemaking. Pt is able to feed herself.  Comments: PLOF per grandson in room. One fall reported out of bed - pt was total assist to roll prior to fall, family unsure of how fall occured.     Hand Dominance   Dominant Hand: Right    Extremity/Trunk Assessment   Upper Extremity Assessment Upper Extremity Assessment: Generalized weakness (LUE > RUE (trace activation found in RUE). 0/5 grip R hand. 3/5 grip L hand. Unable to reposnd to questions regarding sensation. Did say "oww" to deep pressure in RUE.)    Lower Extremity Assessment Lower Extremity Assessment: Generalized weakness (RLE stronger than LLE (2/2 L hip fx in 2/22). Limited ROM in B hips, knees and ankles.)       Communication   Communication: HOH;Expressive difficulties  Cognition Arousal/Alertness: Awake/alert Behavior During Therapy: WFL for tasks assessed/performed Overall Cognitive Status: History of cognitive impairments - at baseline                                 General Comments: Pleasant and attentive throughout session. Responds well to grandson. Left-ward eye gaze and attention (unsure if due to grandson sitting on that side). New-onset aphasia limiting orientation questions. Pt did repeat her name "Monica [name]" to therapist with maximal encouragement. Impaired command following although pt was trying, increased time to process.      General Comments      Exercises Other Exercises Other Exercises: Pt and grandson educated on PT role, POC, d/c recs, safety with mobility, pressure injury development, risk and prevention. Pt was rolled for pressure relief with assist of grandson. Pt did verbalize short words and phrases including "okay", "say name", "I love you", "oww" and "Monica [name]" with maximal encouragement. These were pt first words since Tuesday per grandson.   Assessment/Plan    PT Assessment Patient needs continued PT services  PT Problem List  Decreased strength;Decreased range of motion;Decreased activity tolerance;Decreased safety awareness;Decreased mobility       PT Treatment Interventions Functional mobility training;Patient/family education;Therapeutic activities;Therapeutic exercise    PT Goals (Current goals can be found in the Care Plan section)  Acute Rehab PT Goals Patient Stated Goal: to return to baseline functioning PT Goal Formulation: With patient/family Time For Goal Achievement: 04/22/21 Potential to Achieve Goals: Poor    Frequency Min 2X/week   Barriers to discharge Other (comment) total assist for all caretaking and mobility    Co-evaluation               AM-PAC PT "6 Clicks" Mobility  Outcome Measure Help needed turning from your back to your side while in a flat bed without using bedrails?: Total Help needed moving from lying on your back to sitting on the side of a flat bed without using bedrails?: Total Help needed moving to and from a bed to a chair (including a wheelchair)?: Total Help needed standing  up from a chair using your arms (e.g., wheelchair or bedside chair)?: Total Help needed to walk in hospital room?: Total Help needed climbing 3-5 steps with a railing? : Total 6 Click Score: 6    End of Session   Activity Tolerance: Other (comment) (command following/processing time) Patient left: in bed;with call bell/phone within reach;with bed alarm set;with family/visitor present Nurse Communication: Mobility status;Other (comment) (verbalization, pressure relief) PT Visit Diagnosis: History of falling (Z91.81);Muscle weakness (generalized) (M62.81);Hemiplegia and hemiparesis Hemiplegia - Right/Left: Right Hemiplegia - dominant/non-dominant: Dominant Hemiplegia - caused by: Cerebral infarction    Time: 4742-5956 PT Time Calculation (min) (ACUTE ONLY): 25 min   Charges:   PT Evaluation $PT Eval Moderate Complexity: 1 Mod PT Treatments $Therapeutic Activity: 8-22 mins         Basilia Jumbo PT, DPT 04/08/21 5:20 PM [phone]

## 2021-04-08 NOTE — Consult Note (Addendum)
Neurology Consultation  Reason for Consult: Altered mental status Referring Physician: Dr. Chipper Herb  CC: Altered mental status  History is obtained from: Chart review  HPI: Monica [name] is a 87 y.o. female who has a past medical history of aortic stenosis, coronary artery disease, GI bleed, hyperlipidemia, systolic CHF with reported EF 30 to 35%, hypertension, history of AAA status post endovascular repair, bedbound at baseline per H&P but able to communicate brought in on 04/04/2021 for being completely obtunded and not arousable and moaning.  In the emergency room, there was concern for UTI and she was admitted to the hospital for further work-up. She was found on the floor having fallen out of bed and had tenderness in the right upper abdomen.  She was also hypoxic at the time of presentation and her altered mental status was checked out to be due to hypoxia, UTI and possible an intra-abdominal source of infection.  She is being treated in the hospital for severe sepsis secondary to UTI, and acute metabolic encephalopathy secondary to UTI and has shown mild improvement but remains quite obtunded. Head CT on arrival was unremarkable for acute process.  Repeat head CT done yesterday revealed cortical and subcortical hypodensities in left greater than right occipital lobes which are indeterminate but could reflect recent infarct or posterior reversible encephalopathy syndrome.  There is also possible involvement of the left basal ganglia and left cerebellum as well-again concerning for subacute strokes-MRI recommended for further evaluation but based on the progress notes from the primary hospitalist, she has not been steady enough to get the MRI.  Patient at this time is unable to provide any history.  Blood pressures over the last 24 hours have been between systolic 114-154.  Blood pressures on initial presentation were as high as systolic of 186 and remained in 170s 160s at that time.  LKW:  Sometime prior to arrival 4 days ago tpa given?: no, outside the window Premorbid modified Rankin scale (mRS): 5  ROS: Full ROS was performed and is negative except as noted in the HPI.   Past Medical History:  Diagnosis Date   Aortic stenosis    a. LHC 06/02/17: At least moderate aortic stenosis with a peak to peak gradient of 22 mmHg; b. TTE 05/2017: EF 55-60%, mild HK basal-midinferior wall, Gr1DD, mod to sev AS w/ mean gradient 21 mmHg, valve area 0.99, mild MR, mildly dilated LA    CAD (coronary artery disease) 2018   a. inferior STEMI 06/02/2017: LHC 06/02/17: LM 20, D1 20%, o-pLCx 50, p-mRCA 100% s/p PCI/DES, mRCA 50, dRCA 30   GI bleed    a. noted 06/05/2017   HLD (hyperlipidemia)    Obesity         Family History  Problem Relation Age of Onset   Valvular heart disease Mother    Diabetes Mellitus II Daughter      Social History:   reports that she quit smoking about 3 years ago. Her smoking use included cigarettes. She has a 25.00 pack-year smoking history. She has never used smokeless tobacco. She reports that she does not drink alcohol and does not use drugs.  Medications  Current Facility-Administered Medications:    ceFEPIme (MAXIPIME) 2 g in sodium chloride 0.9 % 100 mL IVPB, 2 g, Intravenous, Q12H, Belue, Lendon Collar, RPH, Last Rate: 200 mL/hr at 04/07/21 2323, 2 g at 04/07/21 2323   chlorhexidine (PERIDEX) 0.12 % solution 15 mL, 15 mL, Mouth Rinse, BID, Garba, Mohammad L, MD, 15 mL at  04/07/21 2317   Chlorhexidine Gluconate Cloth 2 % PADS 6 each, 6 each, Topical, Daily, Rometta Emery, MD, 6 each at 04/07/21 1114   Chlorhexidine Gluconate Cloth 2 % PADS 6 each, 6 each, Topical, Q0600, Rometta Emery, MD, 6 each at 04/08/21 0603   dextrose 5 % and 0.9 % NaCl with KCl 20 mEq/L infusion, , Intravenous, Continuous, Marrion Coy, MD, Last Rate: 50 mL/hr at 04/07/21 1507, Rate Change at 04/07/21 1507   feeding supplement (ENSURE ENLIVE / ENSURE PLUS) liquid 237 mL,  237 mL, Oral, BID BM, Marrion Coy, MD   heparin injection 5,000 Units, 5,000 Units, Subcutaneous, Q8H, Earlie Lou L, MD, 5,000 Units at 04/08/21 0602   MEDLINE mouth rinse, 15 mL, Mouth Rinse, q12n4p, Mikeal Hawthorne, Mohammad L, MD, 15 mL at 04/07/21 1738   morphine 2 MG/ML injection 2 mg, 2 mg, Intravenous, Q4H PRN, Marrion Coy, MD   mupirocin ointment (BACTROBAN) 2 % 1 application, 1 application, Nasal, BID, Rometta Emery, MD, 1 application at 04/07/21 2317   ondansetron (ZOFRAN) tablet 4 mg, 4 mg, Oral, Q6H PRN **OR** ondansetron (ZOFRAN) injection 4 mg, 4 mg, Intravenous, Q6H PRN, Mikeal Hawthorne, Mohammad L, MD   oxyCODONE-acetaminophen (PERCOCET/ROXICET) 5-325 MG per tablet 1 tablet, 1 tablet, Oral, Q12H, Marrion Coy, MD   QUEtiapine (SEROQUEL) tablet 25 mg, 25 mg, Oral, QHS, Marrion Coy, MD   Exam: Current vital signs: BP (!) 118/56 (BP Location: Right Wrist)   Pulse 95   Temp 98.6 F (37 C)   Resp 18   Ht 5\' 5"  (1.651 m)   Wt 87.6 kg   SpO2 96%   BMI 32.14 kg/m  Vital signs in last 24 hours: Temp:  [98.3 F (36.8 C)-98.6 F (37 C)] 98.6 F (37 C) (08/28 0451) Pulse Rate:  [92-104] 95 (08/28 0451) Resp:  [16-20] 18 (08/28 0451) BP: (118-154)/(56-94) 118/56 (08/28 0451) SpO2:  [88 %-100 %] 96 % (08/28 0451) General: Appears obtunded, no spontaneous movements HEENT: Normocephalic/atraumatic, supple neck CVS: Regular rhythm Respiratory: Breathing well and saturating normally on room air for now Extremities: 1+ edema in all fours  Neurological exam Appears obtunded To noxious stimulation, localizes with the left arm, does not move the right arm. Does not follow any commands Appears to be mumbling some words-not comprehensible and extremely dysarthric Did open eyes after repeated sternal rubs. Cranial nerves: Pupils appear equal round react light, no gaze preference or deviation, inconsistent blink to threat from both sides, face appears symmetric, unable to assess tongue  protrusion. Motor examination: To noxious stimulation, does not withdraw the right upper extremity as strongly as the left upper, left lower and to some extent the right lower extremity withdrawal is present but is weaker than the left lower extremity withdrawal. Sensory exam: As above Coordination cannot be assessed NIH SS 1a Level of Conscious.: 2 1b LOC Questions: 2 1c LOC Commands: 2 2 Best Gaze: 0 3 Visual: 0 4 Facial Palsy: 0 5a Motor Arm - left: 2 5b Motor Arm - Right: 3 6a Motor Leg - Left: 2 6b Motor Leg - Right:2  7 Limb Ataxia: 0 8 Sensory: 1 9 Best Language:2  10 Dysarthria: 2 11 Extinct. and Inatten.:0  TOTAL: 20    Labs I have reviewed labs in epic and the results pertinent to this consultation are: No leukocytosis today but on 04/06/2021 had a white count of 16.6, and a 25 and a white count of 21.1. On arrival creatinine 1.11, potassium 2.5, alkaline phosphatase 193,  total bilirubin 3.5. Total bilirubin is down to 2.9 on 04/05/2021. Ammonia level checked this morning is 21 Alkaline phosphatase is 148  CBC    Component Value Date/Time   WBC 8.5 04/08/2021 0442   RBC 4.30 04/08/2021 0442   HGB 11.8 (L) 04/08/2021 0442   HCT 36.0 04/08/2021 0442   PLT 211 04/08/2021 0442   MCV 83.7 04/08/2021 0442   MCH 27.4 04/08/2021 0442   MCHC 32.8 04/08/2021 0442   RDW 21.7 (H) 04/08/2021 0442   LYMPHSABS 1.4 04/08/2021 0442   MONOABS 0.5 04/08/2021 0442   EOSABS 0.8 (H) 04/08/2021 0442   BASOSABS 0.0 04/08/2021 0442    CMP     Component Value Date/Time   NA 142 04/08/2021 0442   K 3.7 04/08/2021 0442   CL 112 (H) 04/08/2021 0442   CO2 25 04/08/2021 0442   GLUCOSE 86 04/08/2021 0442   BUN 15 04/08/2021 0442   CREATININE 0.76 04/08/2021 0442   CALCIUM 8.0 (L) 04/08/2021 0442   PROT 6.2 (L) 04/05/2021 0651   ALBUMIN 2.2 (L) 04/05/2021 0651   AST 25 04/05/2021 0651   ALT 17 04/05/2021 0651   ALKPHOS 148 (H) 04/05/2021 0651   BILITOT 2.9 (H) 04/05/2021 0651    GFRNONAA >60 04/08/2021 0442   GFRAA >60 05/02/2018 0336    Lipid Panel     Component Value Date/Time   CHOL 143 12/23/2017 0927   TRIG 95 12/23/2017 0927   HDL 58 12/23/2017 0927   CHOLHDL 2.5 12/23/2017 0927   VLDL 19 12/23/2017 0927   LDLCALC 66 12/23/2017 0927   Echo June 2022 - EF 30-35%   Imaging I have reviewed the images obtained:  CT-head-on arrival with no acute changes but repeat CTH yesterday revealed cortical and subcortical hypodensities in left greater than right occipital lobes which are indeterminate but could reflect recent infarct or posterior reversible encephalopathy syndrome.  There is also possible involvement of the left basal ganglia and left cerebellum as well-again concerning for subacute strokes-MRI recommended for further evaluation   CT of the chest abdomen and pelvis with contrast on 04/04/2021 showed stable mediastinal and hilar lymph nodes, chronic subpleural fibrotic changes stable, fatty infiltration of liver.  Interval exclusion of abdominal arctic aneurysm by stent graft therapy with embolization of the right internal iliac artery and stenting of both renal arteries.  Right renal artery stent does not appear to opacify satisfactorily and would account for the right renal atrophy when compared with the prior exam.  Aneurysm sac is stable from the prior exam without endoleak.  Diverticulosis without diverticulitis.  Stable cholelithiasis.  Stable cystic lesion arising from the lower pole of the left kidney with recommendations for nonemergent MRI when able  Assessment:  87 year old woman past history of aortic stenosis coronary artery disease GI bleed hyperlipidemia systolic CHF with last EF 3035%, hypertension, history of AAA status post endovascular repair bedbound at baseline brought in for evaluation of altered mental status after being found obtunded and on the floor. Concern for UTI versus cholecystitis as the underlying etiology. Mental status  somewhat improved according to the primary hospitalist-at least from the day of presentation but still remains very encephalopathic. MRI has not been done till now due to her inability to stay still in the scanner CT head on arrival was unremarkable for any acute process although showed a lot of chronic white matter changes but a repeat CT done yesterday is concerning for cortical and subcortical hypodensities left greater than right occipital  lobes-indeterminate but could reflect a recent infarct or posterior reversible encephalopathy syndrome for which neurological consultation was obtained. There is also possible involvement for basal ganglia and left cerebellum concerning for subacute strokes. On my examination, she appears to be focal with right upper extremity weakness-unsure if that is her baseline.  Definitely needs evaluation for stroke. She definitely is very obtunded and unable to follow commands.  Broad differentials: Toxic metabolic encephalopathy Evaluate for stroke Evaluate for posterior reversible encephalopathy syndrome Low likelihood of seizures but an EEG would help Cefepime toxicity? Potential side effect of sedating medications  Recommendations: -Check B12, TSH -MR brain w/o contrast -I would recommend using an alternative antibiotic to cefepime.  Cefepime toxicity can be neurotoxic and can present his altered mental status.  An EEG would be helpful to evaluate that as well-which will be done on Monday. -Routine EEG -Minimize sedating medications -Treatment of UTI and medical management per primary team as you are.  Alkaline phosphatase seems to be uptrending-management per primary team.  Neurology will follow  Discussed with Dr. Chipper Herb -- Milon Dikes, MD Neurologist [facility] Pager: [phone]   Addendum 1027 hrs Preliminary review of the DWI images revealed stroke in the left hemisphere. Ordering MRA head and MRA neck with and without  contrast. She will need a full stroke work-up Updated recommendations in addition to above or below: -Frequent neurochecks -Telemetry -A1c, LDL, 2D echo -Aspirin 81 for now -PT OT speech therapy -No need for permissive hypertension-symptoms have been going on for multiple days.  Goal blood pressure 140/90 or below  Plan relayed to Dr Chipper Herb -- Milon Dikes, MD Neurologist [facility] Pager: [phone]

## 2021-04-09 ENCOUNTER — Inpatient Hospital Stay (HOSPITAL_COMMUNITY)
Admit: 2021-04-09 | Discharge: 2021-04-09 | Disposition: A | Discharge status: Home / Self Care | Payer: Medicare Other | Attending: Student in an Organized Health Care Education/Training Program | Admitting: Student in an Organized Health Care Education/Training Program

## 2021-04-09 DIAGNOSIS — G9341 Metabolic encephalopathy: Secondary | ICD-10-CM

## 2021-04-09 DIAGNOSIS — I63532 Cerebral infarction due to unspecified occlusion or stenosis of left posterior cerebral artery: Secondary | ICD-10-CM

## 2021-04-09 LAB — ECHOCARDIOGRAM COMPLETE
AR max vel: 0.58 cm2
AV Area VTI: 0.49 cm2
AV Area mean vel: 0.5 cm2
AV Mean grad: 36 mmHg
AV Peak grad: 55.1 mmHg
Ao pk vel: 3.71 m/s
Area-P 1/2: 5.02 cm2
Calc EF: 35.8 %
Height: 65 in
MV VTI: 1.28 cm2
S' Lateral: 3.4 cm
Single Plane A2C EF: 25.9 %
Single Plane A4C EF: 43.4 %
Weight: 3089.97 oz

## 2021-04-09 LAB — LIPID PANEL
Cholesterol: 100 mg/dL (ref 0–200)
HDL: 44 mg/dL (ref >40)
LDL Cholesterol: 40 mg/dL (ref 0–99)
Total CHOL/HDL Ratio: 2.3 RATIO
Triglycerides: 82 mg/dL (ref <150)
VLDL: 16 mg/dL (ref 0–40)

## 2021-04-09 LAB — CULTURE, BLOOD (ROUTINE X 2)
Culture: NO GROWTH
Culture: NO GROWTH
Special Requests: ADEQUATE

## 2021-04-09 LAB — HEMOGLOBIN A1C
Hgb A1c MFr Bld: 5.5 % (ref 4.8–5.6)
Mean Plasma Glucose: 111.15 mg/dL

## 2021-04-09 LAB — VITAMIN B12: Vitamin B-12: 281 pg/mL (ref 180–914)

## 2021-04-09 MED ORDER — GUAIFENESIN ER 600 MG PO TB12
1200.0000 mg | ORAL_TABLET | Freq: Two times a day (BID) | ORAL | Status: DC
  Administered 2021-04-09 – 2021-04-10 (×2): 1200 mg via ORAL
  Filled 2021-04-09 (×3): qty 2

## 2021-04-09 MED ORDER — GLYCOPYRROLATE 0.2 MG/ML IJ SOLN
0.1000 mg | Freq: Once | INTRAMUSCULAR | Status: AC
  Administered 2021-04-09: 07:00:00 0.1 mg via INTRAVENOUS
  Filled 2021-04-09: qty 1

## 2021-04-09 NOTE — Progress Notes (Signed)
Eeg done 

## 2021-04-09 NOTE — Progress Notes (Signed)
*  PRELIMINARY RESULTS* Echocardiogram 2D Echocardiogram has been performed.  Joanette Gula Jessic Standifer 04/09/2021, 9:41 AM

## 2021-04-09 NOTE — Evaluation (Signed)
Occupational Therapy Evaluation Patient Details Name: Monica [name] MRN: [id number] DOB: [birth year] Today's Date: 04/09/2021    History of Present Illness 87 y.o. female presented to ED with AMS. Pt dx with severe sepsis. Imaging from CT reveals multiple subacute infarcts - cortical and subcortical hypodensities in left greater than right occipital lobes with possible basal ganglia and cerebellar involvement. Medical history significant of dementia, coronary artery disease with reported EF of 30 to 35%, essential hypertension, history of AAA status post endovascular repair, history of GI bleed, hyperlipidemia, morbid obesity, history of aortic stenosis, s/p intramedullary nail and multiple cardiac cath.   Clinical Impression   Monica [name] responds to greetings by nodding, but is otherwise unable to communicate with caregivers. Asked her name, she repeatedly provides her daughter's name. She is unable to ID her son who is present in room. Unable to follow simple one-step commands (e.g., squeeze my hand). Does not respond to questions re: pain, comfort level, but does not demonstrate any outward signs of discomfort. Prior to hospitalization, pt was bed-bound, with daughter providing the majority of pt care. During today's evaluation, Monica [name] displays no activation of RUE; she has limited ROM of wrist, elbow, and shoulder of LUE (son reports that this pattern was reversed yesterday, with pt demonstrating some movement in RUE, none in LUE). Therapist and son repositioned pt in bed for comfort/pressure relief. Monica [name] was able make a small crossbody movement with L UE, to assist in rolling to R side. Son reports that, prior to this hospitalization, pt was able to feed self; however, pt unable this AM to demonstration any purposeful movement mimicking self-feeding (pt is NPO at present). Although pt was bedfast at baseline, she can benefit from ongoing OT while hospitalized, to address what appears to  be increased weakness and reduced ROM. Family reports that they will continue to provide for pt's needs at home, that they will not consider SNF. Recommend HHOT post DC to assist with pt safety and to minimize caregiver burden. Recommend referral to palliative care services/home hospice for evaluation, if family is interested in this possibility.    Follow Up Recommendations  Home health OT    Equipment Recommendations  None recommended by OT    Recommendations for Other Services Other (comment) (hospice/palliative care)     Precautions / Restrictions Precautions Precautions: Fall Restrictions Weight Bearing Restrictions: No Other Position/Activity Restrictions: bed level mobility at baseline      Mobility Bed Mobility Overal bed mobility: Needs Assistance   Rolling: +2 for physical assistance;Max assist         General bed mobility comments: Pt able to assist minimally with rolling in bed, could reach LUE contralaterally    Transfers                 General transfer comment: deferred - does not perform at baseline    Balance Overall balance assessment: Needs assistance   Sitting balance-Leahy Scale: Zero       Standing balance-Leahy Scale: Zero                             ADL either performed or assessed with clinical judgement   ADL Overall ADL's : Needs assistance/impaired Eating/Feeding: NPO                                   Functional mobility during ADLs: Total  assistance       Vision         Perception     Praxis      Pertinent Vitals/Pain Pain Assessment: No/denies pain     Hand Dominance     Extremity/Trunk Assessment Upper Extremity Assessment Upper Extremity Assessment: Generalized weakness;RUE deficits/detail RUE Coordination: decreased fine motor;decreased gross motor   Lower Extremity Assessment Lower Extremity Assessment: Generalized weakness       Communication  Communication Communication: HOH;Expressive difficulties   Cognition Arousal/Alertness: Awake/alert Behavior During Therapy: WFL for tasks assessed/performed Overall Cognitive Status: History of cognitive impairments - at baseline                                 General Comments: Limited ability to respond to commands. Unable to provide her name or to ID her son, present in room.   General Comments       Exercises Other Exercises Other Exercises: Bed mobility, repositioning for comfort, safety. Provided educ to pt and son re: reducing risk of pressure injuries, POC, d/c recs.   Shoulder Instructions      Home Living Family/patient expects to be discharged to:: Private residence Living Arrangements: Children Available Help at Discharge: Family;Available 24 hours/day Type of Home: House Home Access: Stairs to enter [employer] of Steps: 3   Home Layout: One level               Home Equipment: Walker - 2 wheels;Cane - single point;Bedside commode;Hospital bed   Additional Comments: Pt is no longer ambulatory      Prior Functioning/Environment Level of Independence: Needs assistance  Gait / Transfers Assistance Needed: Ambulatory prior to L hip fx and surgery in Feb 2022. Has not ambulated since. Bed level at baseline. Does not transfer to w/c. If pt leaves home, non-emergent EMS is called to transport. ADL's / Homemaking Assistance Needed: Pt daughter performs total assist with ADLs, IADLs and homemaking. Pt is able to feed herself.   Comments: According to son, present in room, pt was Total A prior to hospitalization        OT Problem List: Decreased strength;Decreased range of motion;Impaired UE functional use      OT Treatment/Interventions: Self-care/ADL training;Therapeutic activities;Therapeutic exercise;Patient/family education    OT Goals(Current goals can be found in the care plan section) Acute Rehab OT Goals Patient Stated Goal:  to keep pt home and comfortable OT Goal Formulation: With family Time For Goal Achievement: 04/23/21 Potential to Achieve Goals: Good ADL Goals Pt Will Perform Eating: with set-up;with min assist;sitting (after cleared by speech) Additional ADL Goal #1: Pt will be able to roll in bed, for comfort and pressure relief, with Max A. Additional ADL Goal #2: Family caregivers will be able to identify/demonstrate 2+ techniques for minimizing risk of pt skin breakdown  OT Frequency: Min 1X/week   Barriers to D/C:            Co-evaluation              AM-PAC OT "6 Clicks" Daily Activity     Outcome Measure Help from another person eating meals?: A Little Help from another person taking care of personal grooming?: A Lot Help from another person toileting, which includes using toliet, bedpan, or urinal?: Total Help from another person bathing (including washing, rinsing, drying)?: Total   Help from another person to put on and taking off regular lower body clothing?: Total 6  Click Score: 8   End of Session Equipment Utilized During Treatment: Oxygen  Activity Tolerance: Patient tolerated treatment well Patient left: in bed;with family/visitor present;with call bell/phone within reach;with bed alarm set;Other (comment) (MD in room)  OT Visit Diagnosis: Muscle weakness (generalized) (M62.81);Other abnormalities of gait and mobility (R26.89)                Time: 2841-3244 OT Time Calculation (min): 10 min Charges:  OT General Charges $OT Visit: 1 Visit OT Evaluation $OT Eval Low Complexity: 1 Low OT Treatments $Self Care/Home Management : 8-22 mins Latina Craver, PhD, MS, OTR/L 04/09/21, 11:07 AM

## 2021-04-09 NOTE — Progress Notes (Signed)
Subjective: Slightly better today  Exam: Vitals:   04/09/21 0700 04/09/21 0745  BP: (!) 175/108 (!) 154/93  Pulse: (!) 109 (!) 109  Resp: 20 20  Temp: 98.7 F (37.1 C)   SpO2: 96% 95%   Gen: In bed, NAD Resp: non-labored breathing, no acute distress Abd: soft, nt  Neuro: MS: she is able to follow simple commands, and answers some simple questions, but appears to have a significant aphasia. Unable to name, and has some perseveration.  UU:EKCM not blink from the right, EOMI, mild right facial weakness.  Motor: she has mild 4/5 weakness of the right arm, able to move bilateral legs, but doe snot cooperate with formal testing.  Sensory:responds to mild stim bilaterally.   Pertinent Labs: LDL 40 A1C pending   TSH 6.7 Ammonia 21 Na 142 Cr 0.76 Ca 8.0  MRI brain reviewed - left parietal infarct, likely embolic.    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 No need for more aggressive statin therapy with an LDL of 40  She takes plavix at home, no specific need for this from a stroke prevention standpoint, but if she needs it for cardiovascular reasons, I do not object to it.   Recommendations: 1) Echo 2) continue ASA, 3) continue home crestor 40mg  daily 4) continue supportive care.   , MD [facility] [phone]  If 7pm- 7am, please page neurology on call as listed in [facility].

## 2021-04-09 NOTE — Progress Notes (Signed)
PROGRESS NOTE    Monica [name]  BSJ:[id number] DOB: February 21, 1934 DOA: 04/04/2021 PCP: Cephas Darby, FNP   Chief complaint.  Altered mental status. Brief Narrative:   Monica [name] is a 87 y.o. female with medical history significant of dementia, coronary artery disease with reported EF of 30 to 35%,, essential hypertension, History of AAA status post endovascular repair, history of GI bleed, hyperlipidemia, morbid obesity, history of aortic stenosis, status post intramedullary nail and multiple cardiac cath brought in today due to altered mental status.  Upon arriving the hospital, patient had abnormal urine, suggesting UTI.  CT chest/abdomen/pelvis suggestive of mild colitis.  Upper quadrant ultrasound did not show any evidence of cholecystitis.  Patient met severe sepsis criteria with tachycardia, tachypnea, significant leukocytosis, elevated lactic acid of 3.0, profound mental status changes, elevated procalcitonin level. Per patient daughter, she just nursing home about 5 days ago, symptoms started 2 days ago with weakness and confusion.  She also had 2 days of diarrhea.  She does not have confusion at baseline, but she is wheelchair-bound. She is placed on cefepime for urinary tract infection while urine culture and blood culture are negative. Due to persistent mental status changes, CT and MRI brain was performed, showed multiple strokes.  Assessment & Plan:   Principal Problem:   Severe sepsis ([place]) Active Problems:   CAD (coronary artery disease)   Severe aortic stenosis   Abdominal aortic aneurysm (AAA) without rupture (HCC)   HFrEF (heart failure with reduced ejection fraction) (HCC)   Hypokalemia   Hyponatremia   Acute metabolic encephalopathy   Pressure injury of skin   Protein-calorie malnutrition, severe   Acute lower UTI   Acute ischemic left PCA stroke (HCC)  Severe sepsis.  Secondary to UTI. Urinary tract infection. Colitis. Condition had improved.  Antibiotics  completed.   Acute metabolic encephalopathy. Left MCA/PCA stroke. Mild dementia. Patient is more awake today, but still confused.  He has some aphasia. Spoke with speech therapy, patient is placed on liquid diet. Discussed with patient and son, POA, he does not want patient to have a PEG tube.  But he is acceptable for temporary Dobbhoff/NG tube.  We will continue liquid diet for today, will consider tube feeding if condition does not improve. Discussed with neurology, patient will need a Zio monitor placed before discharge.   Severe hypokalemia Hyponatremia. Hypophosphatemia. Acute kidney injury. Has improved.  Severe aortic stenosis status post TAVR. Coronary disease. Condition stable.  Essential hypertension. Permissive hypertension due to acute stroke.  DVT prophylaxis: Heparin Code Status: DNR Family Communication: Son updated at bedside. Disposition Plan:    Status is: Inpatient  Remains inpatient appropriate because:Inpatient level of care appropriate due to severity of illness  Dispo: The patient is from: Home              Anticipated d/c is to: SNF              Patient currently is not medically stable to d/c.   Difficult to place patient No        I/O last 3 completed shifts: In: 1124.4 [I.V.:1024.4; IV Piggyback:100] Out: 1300 [Urine:1300] No intake/output data recorded.     Consultants:  Neurology  Procedures: None  Antimicrobials: None  Subjective: Patient is more awake today, she has some aphasia.  But she is moving all extremities.  Speech therapy started a diet. No short of breath or cough. No dysuria hematuria. No fever or chills.  Objective: Vitals:   04/09/21 0450 04/09/21 0700  04/09/21 0745 04/09/21 1154  BP: (!) 144/111 (!) 175/108 (!) 154/93 (!) 173/107  Pulse: (!) 114 (!) 109 (!) 109 (!) 103  Resp: 18 20 20 18   Temp: 98.2 F (36.8 C) 98.7 F (37.1 C) 98.2 F (36.8 C) 98.3 F (36.8 C)  TempSrc: Oral Axillary Oral Oral   SpO2: 94% 96% 95% 98%  Weight:      Height:        Intake/Output Summary (Last 24 hours) at 04/09/2021 1448 Last data filed at 04/08/2021 2117 Gross per 24 hour  Intake 555.63 ml  Output 850 ml  Net -294.37 ml   Filed Weights   04/04/21 1245 04/05/21 1506  Weight: 93.7 kg 87.6 kg    Examination:  General exam: Appears calm and comfortable  Respiratory system: Clear to auscultation. Respiratory effort normal. Cardiovascular system: S1 & S2 heard, RRR. No JVD, murmurs, rubs, gallops or clicks. No pedal edema. Gastrointestinal system: Abdomen is nondistended, soft and nontender. No organomegaly or masses felt. Normal bowel sounds heard. Central nervous system: Alert and oriented x1.  Has some aphasia. Extremities: Symmetric 5 x 5 power. Skin: No rashes, lesions or ulcers     Data Reviewed: I have personally reviewed following labs and imaging studies  CBC: Recent Labs  Lab 04/04/21 1422 04/05/21 0651 04/06/21 0504 04/08/21 0442  WBC 9.0 21.1* 16.6* 8.5  NEUTROABS  --   --  13.6* 5.8  HGB 15.7* 14.2 13.6 11.8*  HCT 45.2 40.9 41.7 36.0  MCV 79.9* 81.0 84.8 83.7  PLT 243 221 246 409   Basic Metabolic Panel: Recent Labs  Lab 04/04/21 1422 04/04/21 1608 04/05/21 0651 04/05/21 1507 04/06/21 0504 04/07/21 0437 04/08/21 0442  NA 130*  --  132*  --  137 140 142  K 2.5*  --  2.5* 3.1* 4.3 3.4* 3.7  CL 89*  --  94*  --  104 112* 112*  CO2 28  --  27  --  28 25 25   GLUCOSE 130*  --  160*  --  107* 98 86  BUN 11  --  11  --  17 17 15   CREATININE 1.11*  --  1.06*  --  0.98 0.81 0.76  CALCIUM 8.5*  --  8.1*  --  7.9* 7.9* 8.0*  MG  --  1.8 2.0  --  2.2 2.0 1.9  PHOS  --   --   --   --   --  1.7* 2.6   GFR: Estimated Creatinine Clearance: 56.2 mL/min (by C-G formula based on SCr of 0.76 mg/dL). Liver Function Tests: Recent Labs  Lab 04/04/21 1422 04/05/21 0651  AST 29 25  ALT 23 17  ALKPHOS 193* 148*  BILITOT 3.5* 2.9*  PROT 7.2 6.2*  ALBUMIN 2.7* 2.2*    No results for input(s): LIPASE, AMYLASE in the last 168 hours. Recent Labs  Lab 04/08/21 0442  AMMONIA 21   Coagulation Profile: Recent Labs  Lab 04/05/21 0651  INR 1.2   Cardiac Enzymes: No results for input(s): CKTOTAL, CKMB, CKMBINDEX, TROPONINI in the last 168 hours. BNP (last 3 results) No results for input(s): PROBNP in the last 8760 hours. HbA1C: Recent Labs    04/09/21 0425  HGBA1C 5.5   CBG: Recent Labs  Lab 04/05/21 1503 04/06/21 2059  GLUCAP 110* 98   Lipid Profile: Recent Labs    04/09/21 0425  CHOL 100  HDL 44  LDLCALC 40  TRIG 82  CHOLHDL 2.3   Thyroid Function  Tests: Recent Labs    04/08/21 0442  TSH 6.714*   Anemia Panel: Recent Labs    04/09/21 0425  VITAMINB12 281   Sepsis Labs: Recent Labs  Lab 04/04/21 1735 04/04/21 2211 04/05/21 0651  PROCALCITON  --   --  0.51  LATICACIDVEN 3.0* 2.4*  --     Recent Results (from the past 240 hour(s))  Resp Panel by RT-PCR (Flu A&B, Covid) Nasopharyngeal Swab     Status: None   Collection Time: 04/04/21  4:36 PM   Specimen: Nasopharyngeal Swab; Nasopharyngeal(NP) swabs in vial transport medium  Result Value Ref Range Status   SARS Coronavirus 2 by RT PCR NEGATIVE NEGATIVE Final    Comment: (NOTE) SARS-CoV-2 target nucleic acids are NOT DETECTED.  The SARS-CoV-2 RNA is generally detectable in upper respiratory specimens during the acute phase of infection. The lowest concentration of SARS-CoV-2 viral copies this assay can detect is 138 copies/mL. A negative result does not preclude SARS-Cov-2 infection and should not be used as the sole basis for treatment or other patient management decisions. A negative result may occur with  improper specimen collection/handling, submission of specimen other than nasopharyngeal swab, presence of viral mutation(s) within the areas targeted by this assay, and inadequate number of viral copies(<138 copies/mL). A negative result must be combined  with clinical observations, patient history, and epidemiological information. The expected result is Negative.  Fact Sheet for Patients:  EntrepreneurPulse.com.au  Fact Sheet for Healthcare Providers:  IncredibleEmployment.be  This test is no t yet approved or cleared by the [place] FDA and  has been authorized for detection and/or diagnosis of SARS-CoV-2 by FDA under an Emergency Use Authorization (EUA). This EUA will remain  in effect (meaning this test can be used) for the duration of the COVID-19 declaration under Section 564(b)(1) of the Act, 21 U.S.C.section 360bbb-3(b)(1), unless the authorization is terminated  or revoked sooner.       Influenza A by PCR NEGATIVE NEGATIVE Final   Influenza B by PCR NEGATIVE NEGATIVE Final    Comment: (NOTE) The Xpert Xpress SARS-CoV-2/FLU/RSV plus assay is intended as an aid in the diagnosis of influenza from Nasopharyngeal swab specimens and should not be used as a sole basis for treatment. Nasal washings and aspirates are unacceptable for Xpert Xpress SARS-CoV-2/FLU/RSV testing.  Fact Sheet for Patients: EntrepreneurPulse.com.au  Fact Sheet for Healthcare Providers: IncredibleEmployment.be  This test is not yet approved or cleared by the [place] FDA and has been authorized for detection and/or diagnosis of SARS-CoV-2 by FDA under an Emergency Use Authorization (EUA). This EUA will remain in effect (meaning this test can be used) for the duration of the COVID-19 declaration under Section 564(b)(1) of the Act, 21 U.S.C. section 360bbb-3(b)(1), unless the authorization is terminated or revoked.  Performed at [facility], [address] [place], [place] [address]   Urine Culture     Status: None   Collection Time: 04/04/21  4:36 PM   Specimen: Urine, Random  Result Value Ref Range Status   Specimen Description   Final    URINE,  RANDOM Performed at [facility], [address], [place], [place] [address]    Special Requests   Final    NONE Performed at [facility], [address], [place], [place] [address]    Culture   Final    NO GROWTH Performed at [facility], [place] [address], [place], [place] [address]    Report Status 04/06/2021 FINAL  Final  Blood culture (routine x 2)     Status: None   Collection Time: 04/04/21  5:36 PM   Specimen: BLOOD  Result Value Ref Range Status   Specimen Description BLOOD BLOOD LEFT HAND  Final   Special Requests   Final    BOTTLES DRAWN AEROBIC ONLY Blood Culture adequate volume   Culture   Final    NO GROWTH 5 DAYS Performed at [facility], [address] [place], [place] [address]    Report Status 04/09/2021 FINAL  Final  Blood culture (routine x 2)     Status: None   Collection Time: 04/04/21  5:38 PM   Specimen: BLOOD  Result Value Ref Range Status   Specimen Description BLOOD RIGHT ANTECUBITAL  Final   Special Requests   Final    BOTTLES DRAWN AEROBIC AND ANAEROBIC Blood Culture results may not be optimal due to an inadequate volume of blood received in culture bottles   Culture   Final    NO GROWTH 5 DAYS Performed at [facility], [address], [place], [place] [address]    Report Status 04/09/2021 FINAL  Final  MRSA Next Gen by PCR, Nasal     Status: None   Collection Time: 04/06/21  9:25 AM   Specimen: Nasal Mucosa; Nasal Swab  Result Value Ref Range Status   MRSA by PCR Next Gen NOT DETECTED NOT DETECTED Final    Comment: (NOTE) The GeneXpert MRSA Assay (FDA approved for NASAL specimens only), is one component of a comprehensive MRSA colonization surveillance program. It is not intended to diagnose MRSA infection nor to guide or monitor treatment for MRSA infections. Test performance is not FDA approved in patients less than 75 years old. Performed at [facility], [address],  [place], [place] [address]          Radiology Studies: CT HEAD WO CONTRAST (5MM)  Result Date: 04/07/2021 CLINICAL DATA:  Delirium.  Fall. EXAM: CT HEAD WITHOUT CONTRAST TECHNIQUE: Contiguous axial images were obtained from the base of the skull through the vertex without intravenous contrast. COMPARISON:  04/04/2021 FINDINGS: Brain: Confluent hypodensities throughout the cerebral white matter similar to the prior CT and are nonspecific but compatible with severe chronic small vessel ischemic disease. There is also heterogeneous hypodensity in the thalami and left lentiform nucleus with the latter potentially having increased from the prior CT. There is hypodensity involving cortex and subcortical white matter in the left occipital lobe with milder changes in the right occipital lobe. There is also a 1 cm rounded hypodensity in the left cerebellar hemisphere which could reflect an infarct or artifact and which was not clearly present on the prior CT. No acute intracranial hemorrhage, mass, midline shift, or extra-axial fluid collection is identified. Mild ventriculomegaly is unchanged and favored to reflect central predominant cerebral atrophy. Vascular: Calcified atherosclerosis at the skull base. No hyperdense vessel. Skull: No fracture or suspicious osseous lesion. Sinuses/Orbits: Paranasal sinuses and mastoid air cells are clear. Unremarkable orbits. Other: None. IMPRESSION: 1. Cortical and subcortical hypodensities in the left greater than right occipital lobes which are indeterminate but could reflect recent infarcts or posterior reversible encephalopathy syndrome (PRES). Possible involvement of the left basal ganglia and left cerebellum as well. An MRI is recommended for further evaluation when the patient's clinical status allows. 2. Severe chronic small vessel ischemic disease. Electronically Signed   By: Logan Bores M.D.   On: 04/07/2021 15:58   MR ANGIO HEAD WO  CONTRAST  Result Date:  04/08/2021 CLINICAL DATA:  87 year old female with abnormal but nonspecific hemispheric hypodensity on head CT yesterday for delirium. Acute left MCA/PCA watershed area ischemia. EXAM: MRA HEAD WITHOUT CONTRAST TECHNIQUE: Angiographic images of the Circle of Willis were acquired using MRA technique without intravenous contrast. COMPARISON:  Noncontrast brain MRI and MRA neck without and with contrast today reported separately. FINDINGS: Intermittently degraded by motion. Antegrade flow in the posterior circulation. Fairly codominant distal vertebral arteries. Both distal vertebral arteries appear irregular, but patent to the vertebrobasilar junction and without high-grade stenosis. Both PICA origins are patent. Patent basilar artery with mid basilar artery irregularity and mild stenosis. SCA and right PCA origins are patent. There is a fetal type left PCA origin. The right posterior communicating artery is diminutive or absent. Visible right PCA branches are within normal limits. There is moderate irregularity and stenosis of the left PCA P2 segment (series 1066, image 15) but preserved distal left PCA flow signal. Antegrade flow in both ICA siphons. Limited cavernous sinus siphon detail due to motion. Distal ICAs and carotid termini are patent. MCA and ACA origins are patent. Limited ACA detail at the anterior communicating and proximal A2 segments due to motion, but those segments appear grossly normal on the postcontrast neck MRA series today. And the distal A2 segments appear patent. Better detail of the MCA M1 segments and MCA bifurcations which are patent. However, MCA branch detail is degraded by motion. No proximal M2 branch occlusion is evident. IMPRESSION: 1. Intermittently motion degraded. Negative for large vessel occlusion. Positive for moderate stenosis of the Left PCA P2 segment, and no fetal type origin of the left PCA. 2. Evidence of distal vertebral artery and basilar artery atherosclerosis without  significant stenosis. Electronically Signed   By: Genevie Ann M.D.   On: 04/08/2021 11:34   MR ANGIO NECK W WO CONTRAST  Result Date: 04/08/2021 CLINICAL DATA:  87 year old female with abnormal but nonspecific hemispheric hypodensity on head CT yesterday for delirium. Acute left MCA/PCA watershed area ischemia. EXAM: MRA NECK WITHOUT AND WITH CONTRAST TECHNIQUE: Multiplanar and multiecho pulse sequences of the neck were obtained without and with intravenous contrast. Angiographic images of the neck were obtained using MRA technique without and with intravenous contrast. CONTRAST:  7.38m GADAVIST GADOBUTROL 1 MMOL/ML IV SOLN COMPARISON:  Brain MRI today reported separately. FINDINGS: Precontrast time-of-flight images demonstrate antegrade flow signal in the bilateral cervical carotid and vertebral arteries. Antegrade flow continues toward the skull base. Following contrast a 3 vessel arch configuration is demonstrated, although the postcontrast portion of the exam is mildly degraded by motion and also venous contamination. Arch and great vessel origins are within normal limits. Tortuous right CCA. Patent right carotid bifurcation with only mild irregularity at the right ICA origin. No cervical right ICA stenosis. Tortuous left CCA. Patent left carotid bifurcation with mild irregularity of the proximal left ICA, no significant stenosis. Proximal subclavian arteries appear patent without stenosis. Neither vertebral artery origin is well demonstrated due to motion artifact. But there is symmetric appearing enhancement of both vertebral arteries in the neck and to the vertebrobasilar junction. Distally the left vertebral artery appears to be dominant. IMPRESSION: No evidence of hemodynamically significant cervical carotid or vertebral artery stenosis. Electronically Signed   By: HGenevie AnnM.D.   On: 04/08/2021 11:29   MR BRAIN WO CONTRAST  Result Date: 04/08/2021 CLINICAL DATA:  87year old female with abnormal but  nonspecific hemispheric hypodensity on head CT yesterday for delirium. Questioned ischemia  versus posterior reversible encephalopathy syndrome (PRES). EXAM: MRI HEAD WITHOUT CONTRAST TECHNIQUE: Multiplanar, multiecho pulse sequences of the brain and surrounding structures were obtained without intravenous contrast. COMPARISON:  Head CT 04/07/2021 and earlier. FINDINGS: Brain: Patchy restricted diffusion in the inferior left parietal lobe and occipital lobe corresponds to left MCA/PCA watershed territory. An area of 4-5 cm is affected. There is associated T2 and FLAIR hyperintensity there compatible with cytotoxic edema. But no other convincing cortical edema. No other restricted diffusion. Confluent bilateral cerebral white matter T2 and FLAIR hyperintensity with bilateral deep white matter capsule involvement. Left greater than right T2 and FLAIR heterogeneity in the bilateral deep gray matter nuclei. Moderate T2 and FLAIR heterogeneity throughout the pons. Small chronic lacunar infarct of the right cerebellum on series 7, image 5. No chronic cortical encephalomalacia identified. No midline shift, mass effect, evidence of mass lesion, ventriculomegaly, extra-axial collection or acute intracranial hemorrhage. Cervicomedullary junction and pituitary are within normal limits. Vascular: Major intracranial vascular flow voids are preserved. MRA today is reported separately. Skull and upper cervical spine: Partially visible cervical spine degeneration. Visualized bone marrow signal is within normal limits. Sinuses/Orbits: Negative. Paranasal sinuses and mastoids are stable and well aerated. Other: Negative visible scalp and face. IMPRESSION: 1. Patchy acute infarcts in the Left MCA/PCA watershed territory. No associated hemorrhage or mass effect. 2. No other acute intracranial abnormality. Advanced chronic signal changes in the bilateral cerebral white matter, deep gray nuclei, and pons. This is nonspecific but favor due  to chronic small vessel disease. 3. MRA today is reported separately. Electronically Signed   By: Genevie Ann M.D.   On: 04/08/2021 11:26   ECHOCARDIOGRAM COMPLETE  Result Date: 04/09/2021    ECHOCARDIOGRAM REPORT   Patient Name:   MINAH AXELROD Date of Exam: 04/09/2021 Medical Rec #:  [id number]      Height:       65.0 in Accession #:    [id number]     Weight:       193.1 lb Date of Birth:  May 06, 1934      BSA:          1.949 m Patient Age:    52 years       BP:           154/93 mmHg Patient Gender: F              HR:           107 bpm. Exam Location:  [facility] Procedure: 2D Echo, Color Doppler and Cardiac Doppler Indications:     I63.9 Stroke  History:         Patient has prior history of Echocardiogram examinations, most                  recent 01/29/2021. CAD, Aortic Valve Disease; Risk                  Factors:Dyslipidemia.  Sonographer:     Charmayne Sheer Referring Phys:  [id number] ASHISH ARORA Diagnosing Phys: Kathlyn Sacramento MD  Sonographer Comments: Suboptimal subcostal window. IMPRESSIONS  1. Left ventricular ejection fraction, by estimation, is 30 to 35%. The left ventricle has moderately decreased function. The left ventricle demonstrates global hypokinesis. There is mild left ventricular hypertrophy. Left ventricular diastolic parameters are indeterminate.  2. Right ventricular systolic function is normal. The right ventricular size is normal. Tricuspid regurgitation signal is inadequate for assessing PA pressure.  3. Left atrial size was mildly dilated.  4. The mitral valve  is normal in structure. Mild mitral valve regurgitation. No evidence of mitral stenosis. Severe mitral annular calcification.  5. The aortic valve is abnormal. There is severe calcifcation of the aortic valve. There is severe thickening of the aortic valve. Aortic valve regurgitation is not visualized. Severe aortic valve stenosis. Aortic valve area, by VTI measures 0.49 cm. Aortic valve mean gradient measures 36.0 mmHg.  6. The inferior vena  cava is normal in size with greater than 50% respiratory variability, suggesting right atrial pressure of 3 mmHg. FINDINGS  Left Ventricle: Left ventricular ejection fraction, by estimation, is 30 to 35%. The left ventricle has moderately decreased function. The left ventricle demonstrates global hypokinesis. The left ventricular internal cavity size was normal in size. There is mild left ventricular hypertrophy. Left ventricular diastolic parameters are indeterminate. Right Ventricle: The right ventricular size is normal. No increase in right ventricular wall thickness. Right ventricular systolic function is normal. Tricuspid regurgitation signal is inadequate for assessing PA pressure. Left Atrium: Left atrial size was mildly dilated. Right Atrium: Right atrial size was normal in size. Pericardium: There is no evidence of pericardial effusion. Mitral Valve: The mitral valve is normal in structure. Severe mitral annular calcification. Mild mitral valve regurgitation. No evidence of mitral valve stenosis. MV peak gradient, 14.1 mmHg. The mean mitral valve gradient is 6.0 mmHg. Tricuspid Valve: The tricuspid valve is normal in structure. Tricuspid valve regurgitation is mild . No evidence of tricuspid stenosis. Aortic Valve: The aortic valve is abnormal. There is severe calcifcation of the aortic valve. There is severe thickening of the aortic valve. Aortic valve regurgitation is not visualized. Severe aortic stenosis is present. Aortic valve mean gradient measures 36.0 mmHg. Aortic valve peak gradient measures 55.1 mmHg. Aortic valve area, by VTI measures 0.49 cm. Pulmonic Valve: The pulmonic valve was normal in structure. Pulmonic valve regurgitation is not visualized. No evidence of pulmonic stenosis. Aorta: The aortic root is normal in size and structure. Venous: The inferior vena cava is normal in size with greater than 50% respiratory variability, suggesting right atrial pressure of 3 mmHg. IAS/Shunts: No  atrial level shunt detected by color flow Doppler.  LEFT VENTRICLE PLAX 2D LVIDd:         3.90 cm     Diastology LVIDs:         3.40 cm     LV e' medial:    8.70 cm/s LV PW:         1.10 cm     LV E/e' medial:  20.3 LV IVS:        0.80 cm     LV e' lateral:   12.80 cm/s LVOT diam:     1.90 cm     LV E/e' lateral: 13.8 LV SV:         36 LV SV Index:   19 LVOT Area:     2.84 cm  LV Volumes (MOD) LV vol d, MOD A2C: 84.4 ml LV vol d, MOD A4C: 93.4 ml LV vol s, MOD A2C: 62.5 ml LV vol s, MOD A4C: 52.9 ml LV SV MOD A2C:     21.9 ml LV SV MOD A4C:     93.4 ml LV SV MOD BP:      32.1 ml LEFT ATRIUM             Index LA diam:        4.10 cm 2.10 cm/m LA Vol (A2C):   69.5 ml 35.66 ml/m LA Vol (A4C):  65.7 ml 33.71 ml/m LA Biplane Vol: 73.0 ml 37.46 ml/m  AORTIC VALVE                    PULMONIC VALVE AV Area (Vmax):    0.58 cm     PV Vmax:       0.76 m/s AV Area (Vmean):   0.50 cm     PV Vmean:      52.800 cm/s AV Area (VTI):     0.49 cm     PV VTI:        0.110 m AV Vmax:           371.00 cm/s  PV Peak grad:  2.3 mmHg AV Vmean:          287.000 cm/s PV Mean grad:  1.0 mmHg AV VTI:            0.737 m AV Peak Grad:      55.1 mmHg AV Mean Grad:      36.0 mmHg LVOT Vmax:         75.40 cm/s LVOT Vmean:        50.200 cm/s LVOT VTI:          0.128 m LVOT/AV VTI ratio: 0.17  AORTA Ao Root diam: 3.20 cm MITRAL VALVE MV Area (PHT): 5.02 cm     SHUNTS MV Area VTI:   1.28 cm     Systemic VTI:  0.13 m MV Peak grad:  14.1 mmHg    Systemic Diam: 1.90 cm MV Mean grad:  6.0 mmHg MV Vmax:       1.88 m/s MV Vmean:      107.0 cm/s MV Decel Time: 151 msec MV E velocity: 177.00 cm/s Kathlyn Sacramento MD Electronically signed by Kathlyn Sacramento MD Signature Date/Time: 04/09/2021/1:51:27 PM    Final         Scheduled Meds:  aspirin  300 mg Rectal Daily   Or   aspirin  325 mg Oral Daily   chlorhexidine  15 mL Mouth Rinse BID   Chlorhexidine Gluconate Cloth  6 each Topical Daily   Chlorhexidine Gluconate Cloth  6 each Topical Q0600    feeding supplement  237 mL Oral BID BM   guaiFENesin  1,200 mg Oral BID   heparin  5,000 Units Subcutaneous Q8H   mouth rinse  15 mL Mouth Rinse q12n4p   mupirocin ointment  1 application Nasal BID   oxyCODONE-acetaminophen  1 tablet Oral Q12H   QUEtiapine  25 mg Oral QHS   Continuous Infusions:  dextrose 5 % and 0.9 % NaCl with KCl 20 mEq/L 50 mL/hr at 04/09/21 1259     LOS: 5 days    Time spent: 28 minutes    Sharen Hones, MD [facility]   To contact the attending provider between 7A-7P or the covering provider during after hours 7P-7A, please log into the web site [email or web address] and access using universal [place] password for that web site. If you do not have the password, please call the hospital operator.  04/09/2021, 2:48 PM

## 2021-04-09 NOTE — Evaluation (Signed)
Clinical/Bedside Swallow Evaluation Patient Details  Name: Monica [name] MRN: [id number] Date of Birth: 05-08-1934  Today's Date: 04/09/2021 Time: SLP Start Time (ACUTE ONLY): 1130 SLP Stop Time (ACUTE ONLY): 1230 SLP Time Calculation (min) (ACUTE ONLY): 60 min  Past Medical History:  Past Medical History:  Diagnosis Date   Aortic stenosis    a. LHC 06/02/17: At least moderate aortic stenosis with a peak to peak gradient of 22 mmHg; b. TTE 05/2017: EF 55-60%, mild HK basal-midinferior wall, Gr1DD, mod to sev AS w/ mean gradient 21 mmHg, valve area 0.99, mild MR, mildly dilated LA    CAD (coronary artery disease) 2018   a. inferior STEMI 06/02/2017: LHC 06/02/17: LM 20, D1 20%, o-pLCx 50, p-mRCA 100% s/p PCI/DES, mRCA 50, dRCA 30   GI bleed    a. noted 06/05/2017   HLD (hyperlipidemia)    Obesity    Past Surgical History:  Past Surgical History:  Procedure Laterality Date   ANKLE RECONSTRUCTION  1956   also ORIF of right arm   CORONARY STENT INTERVENTION N/A 06/01/2017   Procedure: Coronary/Graft Acute MI Revascularization;  Surgeon: Iran Ouch, MD;  Location: [facility];  Service: Cardiovascular;  Laterality: N/A;   ENDOVASCULAR REPAIR/STENT GRAFT N/A 01/24/2021   Procedure: ENDOVASCULAR REPAIR/STENT GRAFT;  Surgeon: Renford Dills, MD;  Location: [facility];  Service: Cardiovascular;  Laterality: N/A;   INCISION AND DRAINAGE HIP Left 01/16/2021   Procedure: IRRIGATION AND DEBRIDEMENT HIP;  Surgeon: Signa Kell, MD;  Location: [facility];  Service: Orthopedics;  Laterality: Left;   INTRAMEDULLARY (IM) NAIL INTERTROCHANTERIC Left 10/03/2020   Procedure: INTRAMEDULLARY (IM) NAIL INTERTROCHANTRIC;  Surgeon: Signa Kell, MD;  Location: [facility];  Service: Orthopedics;  Laterality: Left;   LEFT HEART CATH AND CORONARY ANGIOGRAPHY N/A 06/01/2017   Procedure: LEFT HEART CATH AND CORONARY ANGIOGRAPHY;  Surgeon: Iran Ouch, MD;  Location: [facility]  [facility];  Service: Cardiovascular;  Laterality: N/A;   RIGHT/LEFT HEART CATH AND CORONARY ANGIOGRAPHY N/A 02/09/2021   Procedure: RIGHT/LEFT HEART CATH AND CORONARY ANGIOGRAPHY;  Surgeon: Iran Ouch, MD;  Location: [facility];  Service: Cardiovascular;  Laterality: N/A;   HPI:  Per admitting H&P "Monica [name] is a 87 y.o. female with medical history significant of dementia, coronary artery disease with reported EF of 30 to 35%,, essential hypertension, History of AAA status post endovascular repair, history of GI bleed, hyperlipidemia, morbid obesity, history of aortic stenosis, status post intramedullary nail and multiple cardiac cath who also follows up with Dr. Kirke Corin for her cardiology and aggressive [facility] brought in today due to altered mental status.  Patient usually is awake and communicating although bedbound.  She is completely obtunded at the moment.  Not arousable only moaning.  There was concern about possible UTI.  Also patient was reportedly found on the floor having fallen out of bed..  She appears to be tender in the right upper quadrant on initial evaluation suggestive of possible gallbladder disease.  Patient's son [name] is primary historian.  At this point patient is suspected to have severe sepsis with hypoxia, possible cholelithiasis and UTI."   Assessment / Plan / Recommendation Clinical Impression  Pt presents with moderate dysphagia. Family member in the room reports Pt has had difficulty difficulty maintaining nutrition since her recent abdominal surgery. She was admitted 8/24 with concerns of altered mental status. Neurology following, left parietal infarct. Today, for bedside swlalow eval, Pt tolerated a few small boluses  of thin by Tsp and by straw wihtout immediate s/s of aspiration or gagging. Noted right anterior leakage with the thin. Delayed cough x1 likely indicating larngeal penetraion. When attempting a small sip of thin liquid, Pt began gagging and  then dry heaving which lasted a few minutes. Pt tolerated 2 bites of graham cracker and a few bites of magic cup. Oral transit delay with the graham cracker along with moderate oal residue. Family reports the oral dysphagia is new but that Pt has had difficulty taking in enough nutrition since her abdominal surgery in that she often gags and dry heaves for several minutes with PO's, Rec trial of full liquid, bland foods, Meds through IV, consider GI input. May need NGT if Pt does not tolerate diet. Pt also noted ot have Aphasia. She was able to follow simple directions and produce occasional clear and appropriate phrases. Grandson reports some improvement over the past 24 hrs. ST eval and treat for Aphasia at dicharge (skilled rehab or HH) ST to follow up with tolelration of diet and alter as indicated. SLP Visit Diagnosis: Dysphagia, oropharyngeal phase (R13.12)    Aspiration Risk  Moderate aspiration risk;Risk for inadequate nutrition/hydration;Severe aspiration risk    Diet Recommendation Other (Comment) (full liquid bland diet)   Liquid Administration via: Straw;Spoon Medication Administration: Via alternative means Supervision: Staff to assist with self feeding;Full supervision/cueing for compensatory strategies Compensations: Slow rate;Small sips/bites;Minimize environmental distractions;Follow solids with liquid;Monitor for anterior loss Postural Changes: Seated upright at 90 degrees;Remain upright for at least 30 minutes after po intake    Other  Recommendations Recommended Consults: Consider GI evaluation;Consider esophageal assessment   Follow up Recommendations Skilled Nursing facility      Frequency and Duration min 3x week  1 week       Prognosis Prognosis for Safe Diet Advancement: Guarded Barriers to Reach Goals: Severity of deficits;Language deficits      Swallow Study   General Date of Onset: 04/04/21 HPI: Per admitting H&P "Monica [name] is a 87 y.o. female with  medical history significant of dementia, coronary artery disease with reported EF of 30 to 35%,, essential hypertension, History of AAA status post endovascular repair, history of GI bleed, hyperlipidemia, morbid obesity, history of aortic stenosis, status post intramedullary nail and multiple cardiac cath who also follows up with Dr. Kirke Corin for her cardiology and aggressive [facility] brought in today due to altered mental status.  Patient usually is awake and communicating although bedbound.  She is completely obtunded at the moment.  Not arousable only moaning.  There was concern about possible UTI.  Also patient was reportedly found on the floor having fallen out of bed..  She appears to be tender in the right upper quadrant on initial evaluation suggestive of possible gallbladder disease.  Patient's son [name] is primary historian.  At this point patient is suspected to have severe sepsis with hypoxia, possible cholelithiasis and UTI." Type of Study: Bedside Swallow Evaluation Diet Prior to this Study: NPO Temperature Spikes Noted: No Respiratory Status: Room air History of Recent Intubation: No Behavior/Cognition: Alert;Cooperative;Pleasant mood;Requires cueing;Other (Comment) (New onset of Aphasia) Oral Cavity Assessment: Dry Oral Cavity - Dentition: Adequate natural dentition Vision: Functional for self-feeding Self-Feeding Abilities: Total assist Patient Positioning: Upright in bed Baseline Vocal Quality: Low vocal intensity    Oral/Motor/Sensory Function Overall Oral Motor/Sensory Function: Moderate impairment   Ice Chips Ice chips: Within functional limits Presentation: Spoon   Thin Liquid Thin Liquid: Impaired Presentation: Spoon;Cup;Straw Oral Phase Impairments: Reduced labial  seal Oral Phase Functional Implications: Right anterior spillage Pharyngeal  Phase Impairments: Suspected delayed Swallow;Cough - Delayed (cough x1 only (10 boluses))    Nectar Thick Nectar Thick Liquid:  Impaired Presentation: Cup Oral Phase Impairments: Other (comment) Pharyngeal Phase Impairments: Other (comments) (Pt swallowed but began gagging and dry heaving for several minutes afterward)   Honey Thick     Puree Puree:  (magic cup) Presentation: Spoon Other Comments: Tolerated 4 bites then began gagging   Solid     Solid: Impaired Oral Phase Impairments: Reduced lingual movement/coordination;Reduced labial seal;Impaired mastication Oral Phase Functional Implications: Right anterior spillage;Prolonged oral transit;Impaired mastication;Oral residue;Right lateral sulci pocketing      Eather Colas 04/09/2021,1:01 PM

## 2021-04-10 DIAGNOSIS — I10 Essential (primary) hypertension: Secondary | ICD-10-CM

## 2021-04-10 LAB — BASIC METABOLIC PANEL
Anion gap: 5 (ref 5–15)
BUN: 12 mg/dL (ref 8–23)
CO2: 24 mmol/L (ref 22–32)
Calcium: 8.2 mg/dL — ABNORMAL LOW (ref 8.9–10.3)
Chloride: 111 mmol/L (ref 98–111)
Creatinine, Ser: 0.71 mg/dL (ref 0.44–1.00)
GFR, Estimated: >60 mL/min (ref >60)
Glucose, Bld: 85 mg/dL (ref 70–99)
Potassium: 4 mmol/L (ref 3.5–5.1)
Sodium: 140 mmol/L (ref 135–145)

## 2021-04-10 LAB — MAGNESIUM: Magnesium: 1.9 mg/dL (ref 1.7–2.4)

## 2021-04-10 LAB — PHOSPHORUS: Phosphorus: 2 mg/dL — ABNORMAL LOW (ref 2.5–4.6)

## 2021-04-10 MED ORDER — GUAIFENESIN 100 MG/5ML PO SOLN
20.0000 mL | ORAL | Status: DC
  Administered 2021-04-11: 02:00:00 400 mg via ORAL
  Filled 2021-04-10: qty 10

## 2021-04-10 MED ORDER — ROSUVASTATIN CALCIUM 20 MG PO TABS
40.0000 mg | ORAL_TABLET | Freq: Every day | ORAL | Status: DC
  Administered 2021-04-10 – 2021-04-13 (×4): 40 mg via ORAL
  Filled 2021-04-10 (×4): qty 2

## 2021-04-10 MED ORDER — SODIUM PHOSPHATES 45 MMOLE/15ML IV SOLN
20.0000 mmol | Freq: Once | INTRAVENOUS | Status: AC
  Administered 2021-04-10: 20 mmol via INTRAVENOUS
  Filled 2021-04-10: qty 6.67

## 2021-04-10 NOTE — Progress Notes (Signed)
Subjective: Continues to make slow improvement.   Exam: Vitals:   04/10/21 0502 04/10/21 0746  BP: (!) 157/84 (!) 147/82  Pulse: 94 91  Resp: 16 16  Temp: (!) 97.5 F (36.4 C)   SpO2: 98% 97%   Gen: In bed, NAD Resp: non-labored breathing, no acute distress Abd: soft, nt  Neuro: MS: she is able to follow simple commands, and answers some simple questions, but appears to have a significant aphasia. Unable to name, and has some perseveration.  OT:LXBW not blink from the right, EOMI, mild right facial weakness.  Motor: she has mild 4/5 weakness of the right arm, able to move bilateral legs, but doe snot cooperate with formal testing.  Sensory:responds to mild stim bilaterally.   Pertinent Labs: LDL 40 A1C 5.5  TSH 6.7 Ammonia 21 Na 142 Cr 0.76 Ca 8.0  MRI brain reviewed - left parietal infarct, likely embolic.    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 No need for more aggressive statin therapy with an LDL of 40  She takes plavix at home, no specific need for this from a stroke prevention standpoint, but if she needs it for cardiovascular reasons, I do not object to it.   I think her initial encephalopathy was multifactorial, with strok eplaying a prominent part. She will need significant rehab I suspect.   Recommendations: 1) continue ASA, 2) continue home crestor 40mg  daily 3) continue supportive care.  4) PT, OT,ST 5) consider prolonged(e.g. 30 day or implanted ILR) 6) Neurology will be availabel as needed.   , MD [facility] [phone]  If 7pm- 7am, please page neurology on call as listed in AMION.

## 2021-04-10 NOTE — Progress Notes (Addendum)
Physical Therapy Treatment Patient Details Name: Monica [name] MRN: [id number] DOB: 1934/08/01 Today's Date: 04/10/2021    History of Present Illness 87 y.o. female presented to ED with AMS. Pt dx with severe sepsis. Imaging from CT reveals multiple subacute infarcts - cortical and subcortical hypodensities in left greater than right occipital lobes with possible basal ganglia and cerebellar involvement. Medical history significant of dementia, coronary artery disease with reported EF of 30 to 35%, essential hypertension, history of AAA status post endovascular repair, history of GI bleed, hyperlipidemia, morbid obesity, history of aortic stenosis, s/p intramedullary nail and multiple cardiac cath.    PT Comments    Awake, ready for session.  Grandson in for treatment.  Participated in exercises as described below.  Mod cues to complete exercises to improve strength for bed mobility to decrease caregiver burden.  Of note, pt is found to have a stage 2 small area on Right lateral ankle.  It is small - a bit smaller than pencil eraser but is draining some blood.  RN called and made aware to check.  Bunny boots were on and replaced after session with care to prevent sheering forces on ankle during ex.  Pt and grandson encouraged to do HEP several times a day.     Follow Up Recommendations  SNF;Supervision/Assistance - 24 hour     Equipment Recommendations  None recommended by PT    Recommendations for Other Services       Precautions / Restrictions Precautions Precautions: Fall Restrictions Other Position/Activity Restrictions: bed level mobility at baseline    Mobility  Bed Mobility                    Transfers                    Ambulation/Gait                 Stairs             Wheelchair Mobility    Modified Rankin (Stroke Patients Only)       Balance                                            Cognition  Arousal/Alertness: Awake/alert Behavior During Therapy: WFL for tasks assessed/performed Overall Cognitive Status: History of cognitive impairments - at baseline                                        Exercises Other Exercises Other Exercises: BLE AA/PROM for all ranges x 10    General Comments        Pertinent Vitals/Pain Pain Assessment: Faces Faces Pain Scale: Hurts even more Pain Location: B knees mostly L with ROM Pain Descriptors / Indicators: Sore;Grimacing Pain Intervention(s): Limited activity within patient's tolerance;Monitored during session;Repositioned    Home Living                      Prior Function            PT Goals (current goals can now be found in the care plan section) Progress towards PT goals: Progressing toward goals    Frequency    Min 2X/week      PT Plan Current plan remains appropriate  Co-evaluation              AM-PAC PT "6 Clicks" Mobility   Outcome Measure  Help needed turning from your back to your side while in a flat bed without using bedrails?: Total Help needed moving from lying on your back to sitting on the side of a flat bed without using bedrails?: Total Help needed moving to and from a bed to a chair (including a wheelchair)?: Total Help needed standing up from a chair using your arms (e.g., wheelchair or bedside chair)?: Total Help needed to walk in hospital room?: Total Help needed climbing 3-5 steps with a railing? : Total 6 Click Score: 6    End of Session   Activity Tolerance: Patient tolerated treatment well Patient left: in bed;with call bell/phone within reach;with bed alarm set;with family/visitor present Nurse Communication: Mobility status;Other (comment) PT Visit Diagnosis: History of falling (Z91.81);Muscle weakness (generalized) (M62.81);Hemiplegia and hemiparesis Hemiplegia - Right/Left: Right Hemiplegia - dominant/non-dominant: Dominant Hemiplegia - caused by:  Cerebral infarction     Time: 5621-3086 PT Time Calculation (min) (ACUTE ONLY): 12 min  Charges:  $Therapeutic Exercise: 8-22 mins                    Danielle Dess, PTA 04/10/21, 10:07 AM , 10:05 AM

## 2021-04-10 NOTE — Procedures (Signed)
History: 87 yo F with AMS  Sedation: None  Technique: This EEG was acquired with electrodes placed according to the International 10-20 electrode system (including Fp1, Fp2, F3, F4, C3, C4, P3, P4, O1, O2, T3, T4, T5, T6, A1, A2, Fz, Cz, Pz). The following electrodes were missing or displaced: none.   Background: There is a posterior dominant rhythm of 9 to 10 Hz as well as frontally predominant beta activity in a normal distribution.  In addition, there is generalized irregular delta and theta range activities that intrude into the background throughout the study.    Photic stimulation: Physiologic driving is present  EEG Abnormalities: 1) generalized irregular slow activity  Clinical Interpretation: This EEG is consistent with a mild generalized nonspecific cerebral dysfunction (encephalopathy). There was no seizure or seizure predisposition recorded on this study. Please note that lack of epileptiform activity on EEG does not preclude the possibility of epilepsy.   Ritta Slot, MD [facility] [phone]  If 7pm- 7am, please page neurology on call as listed in AMION.

## 2021-04-10 NOTE — Progress Notes (Addendum)
PROGRESS NOTE    Monica [name]  GQQ:[id number] DOB: 10-09-1933 DOA: 04/04/2021 PCP: Cephas Darby, FNP   Chief complaint.  Altered mental status. Brief Narrative:  Monica [name] is a 87 y.o. female with medical history significant of dementia, coronary artery disease with reported EF of 30 to 35%,, essential hypertension, History of AAA status post endovascular repair, history of GI bleed, hyperlipidemia, morbid obesity, history of aortic stenosis, status post intramedullary nail and multiple cardiac cath brought in today due to altered mental status.  Upon arriving the hospital, patient had abnormal urine, suggesting UTI.  CT chest/abdomen/pelvis suggestive of mild colitis.  Upper quadrant ultrasound did not show any evidence of cholecystitis.  Patient met severe sepsis criteria with tachycardia, tachypnea, significant leukocytosis, elevated lactic acid of 3.0, profound mental status changes, elevated procalcitonin level. Per patient daughter, she just nursing home about 5 days ago, symptoms started 2 days ago with weakness and confusion.  She also had 2 days of diarrhea.  She does not have confusion at baseline, but she is wheelchair-bound. She is placed on cefepime for urinary tract infection while urine culture and blood culture are negative. Due to persistent mental status changes, CT and MRI brain was performed, showed strokes in the left PCA/MCA watershed area.   Assessment & Plan:   Principal Problem:   Severe sepsis ([place]) Active Problems:   CAD (coronary artery disease)   Severe aortic stenosis   Abdominal aortic aneurysm (AAA) without rupture (HCC)   HFrEF (heart failure with reduced ejection fraction) (HCC)   Hypokalemia   Hyponatremia   Acute metabolic encephalopathy   Pressure injury of skin   Protein-calorie malnutrition, severe   Acute lower UTI   Acute ischemic left PCA stroke (HCC)  Severe sepsis.  Secondary to UTI. Urinary tract infection. Colitis. Condition  resolved.   Acute metabolic encephalopathy. Left MCA/PCA stroke. Mild dementia. Aspirin, Crestor. Currently, patient still has significant dysphagia.  Speech therapy has started full liquid diet.  Patient was able to tolerate better today. Will continue current treatment. She will need a Zio cardiac monitor at time of discharge.   Severe hypokalemia Hyponatremia. Hypophosphatemia. Acute kidney injury Condition all improved. Give another dose of sodium phosphate   Severe aortic stenosis status post TAVR. Coronary artery disease Conditions are stable.   DVT prophylaxis: Heparin Code Status: DNR Family Communication:  Disposition Plan:    Status is: Inpatient  Remains inpatient appropriate because:Inpatient level of care appropriate due to severity of illness  Dispo: The patient is from: Home              Anticipated d/c is to: SNF              Patient currently is not medically stable to d/c.   Difficult to place patient No        I/O last 3 completed shifts: In: 53 [P.O.:75] Out: 1150 [Urine:1150] Total I/O In: 1923.4 [I.V.:1923.4] Out: -      Consultants:  Neurology  Procedures: None  Antimicrobials: None  Subjective: Patient is tolerating liquid diet, still on lower dose IV fluids to maintain rehydration. Denies any short of breath or cough. No abdominal pain or nausea vomiting. No dysuria hematuria. No fever or chills.  Objective: Vitals:   04/09/21 2358 04/10/21 0502 04/10/21 0746 04/10/21 1201  BP: (!) 102/59 (!) 157/84 (!) 147/82 (!) 157/70  Pulse: 98 94 91 96  Resp: 16 16 16    Temp: 99.1 F (37.3 C) (!) 97.5 F (36.4 C)  98.4 F (36.9 C)  TempSrc:    Oral  SpO2: 93% 98% 97%   Weight:      Height:        Intake/Output Summary (Last 24 hours) at 04/10/2021 1546 Last data filed at 04/10/2021 1056 Gross per 24 hour  Intake 1998.42 ml  Output 300 ml  Net 1698.42 ml   Filed Weights   04/04/21 1245 04/05/21 1506  Weight: 93.7  kg 87.6 kg    Examination:  General exam: Appears calm and comfortable  Respiratory system: Clear to auscultation. Respiratory effort normal. Cardiovascular system: S1 & S2 heard, RRR. No JVD, murmurs, rubs, gallops or clicks. No pedal edema. Gastrointestinal system: Abdomen is nondistended, soft and nontender. No organomegaly or masses felt. Normal bowel sounds heard. Central nervous system: Alert and oriented x2. No focal neurological deficits. Extremities: Symmetric 5 x 5 power. Skin: No rashes, lesions or ulcers Psychiatry:  Mood & affect appropriate.     Data Reviewed: I have personally reviewed following labs and imaging studies  CBC: Recent Labs  Lab 04/04/21 1422 04/05/21 0651 04/06/21 0504 04/08/21 0442  WBC 9.0 21.1* 16.6* 8.5  NEUTROABS  --   --  13.6* 5.8  HGB 15.7* 14.2 13.6 11.8*  HCT 45.2 40.9 41.7 36.0  MCV 79.9* 81.0 84.8 83.7  PLT 243 221 246 500   Basic Metabolic Panel: Recent Labs  Lab 04/05/21 0651 04/05/21 1507 04/06/21 0504 04/07/21 0437 04/08/21 0442 04/10/21 0456  NA 132*  --  137 140 142 140  K 2.5* 3.1* 4.3 3.4* 3.7 4.0  CL 94*  --  104 112* 112* 111  CO2 27  --  28 25 25 24   GLUCOSE 160*  --  107* 98 86 85  BUN 11  --  17 17 15 12   CREATININE 1.06*  --  0.98 0.81 0.76 0.71  CALCIUM 8.1*  --  7.9* 7.9* 8.0* 8.2*  MG 2.0  --  2.2 2.0 1.9 1.9  PHOS  --   --   --  1.7* 2.6 2.0*   GFR: Estimated Creatinine Clearance: 56.2 mL/min (by C-G formula based on SCr of 0.71 mg/dL). Liver Function Tests: Recent Labs  Lab 04/04/21 1422 04/05/21 0651  AST 29 25  ALT 23 17  ALKPHOS 193* 148*  BILITOT 3.5* 2.9*  PROT 7.2 6.2*  ALBUMIN 2.7* 2.2*   No results for input(s): LIPASE, AMYLASE in the last 168 hours. Recent Labs  Lab 04/08/21 0442  AMMONIA 21   Coagulation Profile: Recent Labs  Lab 04/05/21 0651  INR 1.2   Cardiac Enzymes: No results for input(s): CKTOTAL, CKMB, CKMBINDEX, TROPONINI in the last 168 hours. BNP (last 3  results) No results for input(s): PROBNP in the last 8760 hours. HbA1C: Recent Labs    04/09/21 0425  HGBA1C 5.5   CBG: Recent Labs  Lab 04/05/21 1503 04/06/21 2059  GLUCAP 110* 98   Lipid Profile: Recent Labs    04/09/21 0425  CHOL 100  HDL 44  LDLCALC 40  TRIG 82  CHOLHDL 2.3   Thyroid Function Tests: Recent Labs    04/08/21 0442  TSH 6.714*   Anemia Panel: Recent Labs    04/09/21 0425  VITAMINB12 281   Sepsis Labs: Recent Labs  Lab 04/04/21 1735 04/04/21 2211 04/05/21 0651  PROCALCITON  --   --  0.51  LATICACIDVEN 3.0* 2.4*  --     Recent Results (from the past 240 hour(s))  Resp Panel by RT-PCR (Flu A&B,  Covid) Nasopharyngeal Swab     Status: None   Collection Time: 04/04/21  4:36 PM   Specimen: Nasopharyngeal Swab; Nasopharyngeal(NP) swabs in vial transport medium  Result Value Ref Range Status   SARS Coronavirus 2 by RT PCR NEGATIVE NEGATIVE Final    Comment: (NOTE) SARS-CoV-2 target nucleic acids are NOT DETECTED.  The SARS-CoV-2 RNA is generally detectable in upper respiratory specimens during the acute phase of infection. The lowest concentration of SARS-CoV-2 viral copies this assay can detect is 138 copies/mL. A negative result does not preclude SARS-Cov-2 infection and should not be used as the sole basis for treatment or other patient management decisions. A negative result may occur with  improper specimen collection/handling, submission of specimen other than nasopharyngeal swab, presence of viral mutation(s) within the areas targeted by this assay, and inadequate number of viral copies(<138 copies/mL). A negative result must be combined with clinical observations, patient history, and epidemiological information. The expected result is Negative.  Fact Sheet for Patients:  EntrepreneurPulse.com.au  Fact Sheet for Healthcare Providers:  IncredibleEmployment.be  This test is no t yet approved or  cleared by the [place] FDA and  has been authorized for detection and/or diagnosis of SARS-CoV-2 by FDA under an Emergency Use Authorization (EUA). This EUA will remain  in effect (meaning this test can be used) for the duration of the COVID-19 declaration under Section 564(b)(1) of the Act, 21 U.S.C.section 360bbb-3(b)(1), unless the authorization is terminated  or revoked sooner.       Influenza A by PCR NEGATIVE NEGATIVE Final   Influenza B by PCR NEGATIVE NEGATIVE Final    Comment: (NOTE) The Xpert Xpress SARS-CoV-2/FLU/RSV plus assay is intended as an aid in the diagnosis of influenza from Nasopharyngeal swab specimens and should not be used as a sole basis for treatment. Nasal washings and aspirates are unacceptable for Xpert Xpress SARS-CoV-2/FLU/RSV testing.  Fact Sheet for Patients: EntrepreneurPulse.com.au  Fact Sheet for Healthcare Providers: IncredibleEmployment.be  This test is not yet approved or cleared by the [place] FDA and has been authorized for detection and/or diagnosis of SARS-CoV-2 by FDA under an Emergency Use Authorization (EUA). This EUA will remain in effect (meaning this test can be used) for the duration of the COVID-19 declaration under Section 564(b)(1) of the Act, 21 U.S.C. section 360bbb-3(b)(1), unless the authorization is terminated or revoked.  Performed at [facility], [address], [place], [place] [address]   Urine Culture     Status: None   Collection Time: 04/04/21  4:36 PM   Specimen: Urine, Random  Result Value Ref Range Status   Specimen Description   Final    URINE, RANDOM Performed at [facility], [address] [place], [place] [address]    Special Requests   Final    NONE Performed at [facility], [address] [place], [place] [address]    Culture   Final    NO GROWTH Performed at [facility], [place] [address], [place],  [place] [address]    Report Status 04/06/2021 FINAL  Final  Blood culture (routine x 2)     Status: None   Collection Time: 04/04/21  5:36 PM   Specimen: BLOOD  Result Value Ref Range Status   Specimen Description BLOOD BLOOD LEFT HAND  Final   Special Requests   Final    BOTTLES DRAWN AEROBIC ONLY Blood Culture adequate volume   Culture   Final    NO GROWTH 5 DAYS  Performed at [facility], [address] [place], [place] [address]    Report Status 04/22/21 FINAL  Final  Blood culture (routine x 2)     Status: None   Collection Time: 04/04/21  5:38 PM   Specimen: BLOOD  Result Value Ref Range Status   Specimen Description BLOOD RIGHT ANTECUBITAL  Final   Special Requests   Final    BOTTLES DRAWN AEROBIC AND ANAEROBIC Blood Culture results may not be optimal due to an inadequate volume of blood received in culture bottles   Culture   Final    NO GROWTH 5 DAYS Performed at [facility], [address], [place], [place] [address]    Report Status 22-Apr-2021 FINAL  Final  MRSA Next Gen by PCR, Nasal     Status: None   Collection Time: 04/06/21  9:25 AM   Specimen: Nasal Mucosa; Nasal Swab  Result Value Ref Range Status   MRSA by PCR Next Gen NOT DETECTED NOT DETECTED Final    Comment: (NOTE) The GeneXpert MRSA Assay (FDA approved for NASAL specimens only), is one component of a comprehensive MRSA colonization surveillance program. It is not intended to diagnose MRSA infection nor to guide or monitor treatment for MRSA infections. Test performance is not FDA approved in patients less than 36 years old. Performed at [facility], [address] [place], [place] [address]          Radiology Studies: EEG adult  Result Date: 04/22/21 Greta Doom, MD     04/10/2021  9:30 AM History: 87 yo F with AMS Sedation: None Technique: This EEG was acquired with electrodes placed according to the International 10-20 electrode system (including Fp1,  Fp2, F3, F4, C3, C4, P3, P4, O1, O2, T3, T4, T5, T6, A1, A2, Fz, Cz, Pz). The following electrodes were missing or displaced: none. Background: There is a posterior dominant rhythm of 9 to 10 Hz as well as frontally predominant beta activity in a normal distribution.  In addition, there is generalized irregular delta and theta range activities that intrude into the background throughout the study. Photic stimulation: Physiologic driving is present EEG Abnormalities: 1) generalized irregular slow activity Clinical Interpretation: This EEG is consistent with a mild generalized nonspecific cerebral dysfunction (encephalopathy). There was no seizure or seizure predisposition recorded on this study. Please note that lack of epileptiform activity on EEG does not preclude the possibility of epilepsy. Roland Rack, MD [facility] [phone] If 7pm- 7am, please page neurology on call as listed in [place].   ECHOCARDIOGRAM COMPLETE  Result Date: 04-22-21    ECHOCARDIOGRAM REPORT   Patient Name:   Monica [name] Date of Exam: 04/22/2021 Medical Rec #:  [id number]      Height:       65.0 in Accession #:    [id number]     Weight:       193.1 lb Date of Birth:  [birth year]      BSA:          1.949 m Patient Age:    35 years       BP:           154/93 mmHg Patient Gender: F              HR:           107 bpm. Exam Location:  [facility] Procedure: 2D Echo, Color Doppler and Cardiac Doppler Indications:     I63.9 Stroke  History:  Patient has prior history of Echocardiogram examinations, most                  recent 01/29/2021. CAD, Aortic Valve Disease; Risk                  Factors:Dyslipidemia.  Sonographer:     Charmayne Sheer Referring Phys:  [id number] ASHISH ARORA Diagnosing Phys: Kathlyn Sacramento MD  Sonographer Comments: Suboptimal subcostal window. IMPRESSIONS  1. Left ventricular ejection fraction, by estimation, is 30 to 35%. The left ventricle has moderately decreased function. The left ventricle  demonstrates global hypokinesis. There is mild left ventricular hypertrophy. Left ventricular diastolic parameters are indeterminate.  2. Right ventricular systolic function is normal. The right ventricular size is normal. Tricuspid regurgitation signal is inadequate for assessing PA pressure.  3. Left atrial size was mildly dilated.  4. The mitral valve is normal in structure. Mild mitral valve regurgitation. No evidence of mitral stenosis. Severe mitral annular calcification.  5. The aortic valve is abnormal. There is severe calcifcation of the aortic valve. There is severe thickening of the aortic valve. Aortic valve regurgitation is not visualized. Severe aortic valve stenosis. Aortic valve area, by VTI measures 0.49 cm. Aortic valve mean gradient measures 36.0 mmHg.  6. The inferior vena cava is normal in size with greater than 50% respiratory variability, suggesting right atrial pressure of 3 mmHg. FINDINGS  Left Ventricle: Left ventricular ejection fraction, by estimation, is 30 to 35%. The left ventricle has moderately decreased function. The left ventricle demonstrates global hypokinesis. The left ventricular internal cavity size was normal in size. There is mild left ventricular hypertrophy. Left ventricular diastolic parameters are indeterminate. Right Ventricle: The right ventricular size is normal. No increase in right ventricular wall thickness. Right ventricular systolic function is normal. Tricuspid regurgitation signal is inadequate for assessing PA pressure. Left Atrium: Left atrial size was mildly dilated. Right Atrium: Right atrial size was normal in size. Pericardium: There is no evidence of pericardial effusion. Mitral Valve: The mitral valve is normal in structure. Severe mitral annular calcification. Mild mitral valve regurgitation. No evidence of mitral valve stenosis. MV peak gradient, 14.1 mmHg. The mean mitral valve gradient is 6.0 mmHg. Tricuspid Valve: The tricuspid valve is normal in  structure. Tricuspid valve regurgitation is mild . No evidence of tricuspid stenosis. Aortic Valve: The aortic valve is abnormal. There is severe calcifcation of the aortic valve. There is severe thickening of the aortic valve. Aortic valve regurgitation is not visualized. Severe aortic stenosis is present. Aortic valve mean gradient measures 36.0 mmHg. Aortic valve peak gradient measures 55.1 mmHg. Aortic valve area, by VTI measures 0.49 cm. Pulmonic Valve: The pulmonic valve was normal in structure. Pulmonic valve regurgitation is not visualized. No evidence of pulmonic stenosis. Aorta: The aortic root is normal in size and structure. Venous: The inferior vena cava is normal in size with greater than 50% respiratory variability, suggesting right atrial pressure of 3 mmHg. IAS/Shunts: No atrial level shunt detected by color flow Doppler.  LEFT VENTRICLE PLAX 2D LVIDd:         3.90 cm     Diastology LVIDs:         3.40 cm     LV e' medial:    8.70 cm/s LV PW:         1.10 cm     LV E/e' medial:  20.3 LV IVS:        0.80 cm     LV e' lateral:  12.80 cm/s LVOT diam:     1.90 cm     LV E/e' lateral: 13.8 LV SV:         36 LV SV Index:   19 LVOT Area:     2.84 cm  LV Volumes (MOD) LV vol d, MOD A2C: 84.4 ml LV vol d, MOD A4C: 93.4 ml LV vol s, MOD A2C: 62.5 ml LV vol s, MOD A4C: 52.9 ml LV SV MOD A2C:     21.9 ml LV SV MOD A4C:     93.4 ml LV SV MOD BP:      32.1 ml LEFT ATRIUM             Index LA diam:        4.10 cm 2.10 cm/m LA Vol (A2C):   69.5 ml 35.66 ml/m LA Vol (A4C):   65.7 ml 33.71 ml/m LA Biplane Vol: 73.0 ml 37.46 ml/m  AORTIC VALVE                    PULMONIC VALVE AV Area (Vmax):    0.58 cm     PV Vmax:       0.76 m/s AV Area (Vmean):   0.50 cm     PV Vmean:      52.800 cm/s AV Area (VTI):     0.49 cm     PV VTI:        0.110 m AV Vmax:           371.00 cm/s  PV Peak grad:  2.3 mmHg AV Vmean:          287.000 cm/s PV Mean grad:  1.0 mmHg AV VTI:            0.737 m AV Peak Grad:      55.1 mmHg AV  Mean Grad:      36.0 mmHg LVOT Vmax:         75.40 cm/s LVOT Vmean:        50.200 cm/s LVOT VTI:          0.128 m LVOT/AV VTI ratio: 0.17  AORTA Ao Root diam: 3.20 cm MITRAL VALVE MV Area (PHT): 5.02 cm     SHUNTS MV Area VTI:   1.28 cm     Systemic VTI:  0.13 m MV Peak grad:  14.1 mmHg    Systemic Diam: 1.90 cm MV Mean grad:  6.0 mmHg MV Vmax:       1.88 m/s MV Vmean:      107.0 cm/s MV Decel Time: 151 msec MV E velocity: 177.00 cm/s Kathlyn Sacramento MD Electronically signed by Kathlyn Sacramento MD Signature Date/Time: 04/09/2021/1:51:27 PM    Final         Scheduled Meds:  aspirin  300 mg Rectal Daily   Or   aspirin  325 mg Oral Daily   chlorhexidine  15 mL Mouth Rinse BID   Chlorhexidine Gluconate Cloth  6 each Topical Daily   Chlorhexidine Gluconate Cloth  6 each Topical Q0600   feeding supplement  237 mL Oral BID BM   guaiFENesin  1,200 mg Oral BID   heparin  5,000 Units Subcutaneous Q8H   mouth rinse  15 mL Mouth Rinse q12n4p   oxyCODONE-acetaminophen  1 tablet Oral Q12H   QUEtiapine  25 mg Oral QHS   rosuvastatin  40 mg Oral Daily   Continuous Infusions:  dextrose 5 % and 0.9 % NaCl with KCl 20 mEq/L 50 mL/hr  at 04/10/21 1053   sodium phosphate  Dextrose 5% IVPB 20 mmol (04/10/21 1056)     LOS: 6 days    Time spent: 28 minutes    Sharen Hones, MD [facility]   To contact the attending provider between 7A-7P or the covering provider during after hours 7P-7A, please log into the web site [email or web address] and access using universal [place] password for that web site. If you do not have the password, please call the hospital operator.  04/10/2021, 3:46 PM

## 2021-04-10 NOTE — Progress Notes (Signed)
[facility]) Hospital Liaison Note  This patient is currently enrolled in [facility] outpatient-based palliative care.   [facility] will continue to follow for discharge disposition.   Please call with any outpatient based palliative care related questions or concerns.   Thank you,   Bobbie "Einar Gip, RN, BSN [facility] Liaison [phone]

## 2021-04-10 NOTE — Progress Notes (Signed)
  Speech Language Pathology Treatment: Dysphagia  Patient Details Name: Monica [name] MRN: [id number] DOB: 1934/06/16 Today's Date: 04/10/2021 Time: 0929-5747 SLP Time Calculation (min) (ACUTE ONLY): 30 min  Assessment / Plan / Recommendation Clinical Impression  Pt visited for dysphagia tx. Son and grandson present for treatment. Just prior to entering Pt was given meds crushed. Pt began gagging and dry heaving after the first swallow. After given a period of rest, she tolerated all of magic cup, a few bites of grits and several pieces of watermelon. Extended time needed to chew the watermelon along with mmoderate resdiue. Alternating magic cup with every few bites helped clear the residue. Occasional  wet cough noted. She also tolerated several single sips of thin water. Family reports good toleration of the soup/broth yesterday. Rec continue with current full liquid bland diet for now as it seems to have more foods she can tolerate or keep down. Discussed diet recs with family along with the rec that they be allowed to bring soft items that's she requests and try them. Intake is likely to be poor but family is happy with the amount of improvement they have seen over the past 24 hrs.   HPI HPI: Per admitting H&P "Monica [name] is a 87 y.o. female with medical history significant of dementia, coronary artery disease with reported EF of 30 to 35%,, essential hypertension, History of AAA status post endovascular repair, history of GI bleed, hyperlipidemia, morbid obesity, history of aortic stenosis, status post intramedullary nail and multiple cardiac cath who also follows up with Dr. Kirke Corin for her cardiology and aggressive [facility] brought in today due to altered mental status.  Patient usually is awake and communicating although bedbound.  She is completely obtunded at the moment.  Not arousable only moaning.  There was concern about possible UTI.  Also patient was reportedly found on the floor  having fallen out of bed..  She appears to be tender in the right upper quadrant on initial evaluation suggestive of possible gallbladder disease.  Patient's son [name] is primary historian.  At this point patient is suspected to have severe sepsis with hypoxia, possible cholelithiasis and UTI."      SLP Plan  Continue with current plan of care       Recommendations  Diet recommendations: Other(comment) (full liquid, allow family to bring foods that work for Pt) Medication Administration: Other (Comment) (Pt gags with crushe dmeds. Any thing that cannot be given IV, try whole with magic cup) Supervision: Full supervision/cueing for compensatory strategies Compensations: Slow rate;Small sips/bites;Minimize environmental distractions;Follow solids with liquid;Monitor for anterior loss Postural Changes and/or Swallow Maneuvers: Seated upright 90 degrees;Upright 30-60 min after meal                Oral Care Recommendations: Oral care before and after PO Follow up Recommendations: Skilled Nursing facility SLP Visit Diagnosis: Dysphagia, oropharyngeal phase (R13.12) Plan: Continue with current plan of care       GO                Eather Colas 04/10/2021, 11:52 AM

## 2021-04-11 LAB — MAGNESIUM: Magnesium: 1.8 mg/dL (ref 1.7–2.4)

## 2021-04-11 LAB — BASIC METABOLIC PANEL
Anion gap: 5 (ref 5–15)
BUN: 13 mg/dL (ref 8–23)
CO2: 23 mmol/L (ref 22–32)
Calcium: 7.9 mg/dL — ABNORMAL LOW (ref 8.9–10.3)
Chloride: 107 mmol/L (ref 98–111)
Creatinine, Ser: 0.72 mg/dL (ref 0.44–1.00)
GFR, Estimated: >60 mL/min (ref >60)
Glucose, Bld: 113 mg/dL — ABNORMAL HIGH (ref 70–99)
Potassium: 4 mmol/L (ref 3.5–5.1)
Sodium: 135 mmol/L (ref 135–145)

## 2021-04-11 LAB — PHOSPHORUS: Phosphorus: 2.9 mg/dL (ref 2.5–4.6)

## 2021-04-11 MED ORDER — METOPROLOL TARTRATE 25 MG PO TABS
12.5000 mg | ORAL_TABLET | Freq: Two times a day (BID) | ORAL | Status: DC
  Administered 2021-04-11 – 2021-04-14 (×7): 12.5 mg via ORAL
  Filled 2021-04-11 (×8): qty 1

## 2021-04-11 MED ORDER — OXYCODONE-ACETAMINOPHEN 5-325 MG PO TABS
1.0000 | ORAL_TABLET | Freq: Two times a day (BID) | ORAL | Status: DC | PRN
  Administered 2021-04-12: 1 via ORAL
  Filled 2021-04-11: qty 1

## 2021-04-11 MED ORDER — CLOPIDOGREL BISULFATE 75 MG PO TABS
75.0000 mg | ORAL_TABLET | Freq: Every day | ORAL | Status: DC
  Administered 2021-04-12 – 2021-04-14 (×3): 75 mg via ORAL
  Filled 2021-04-11 (×3): qty 1

## 2021-04-11 MED ORDER — ASPIRIN EC 81 MG PO TBEC
81.0000 mg | DELAYED_RELEASE_TABLET | Freq: Every day | ORAL | Status: DC
  Administered 2021-04-12 – 2021-04-14 (×3): 81 mg via ORAL
  Filled 2021-04-11 (×3): qty 1

## 2021-04-11 MED ORDER — SENNOSIDES-DOCUSATE SODIUM 8.6-50 MG PO TABS
1.0000 | ORAL_TABLET | Freq: Two times a day (BID) | ORAL | Status: DC
  Administered 2021-04-11 – 2021-04-14 (×6): 1 via ORAL
  Filled 2021-04-11 (×6): qty 1

## 2021-04-11 MED ORDER — GUAIFENESIN 100 MG/5ML PO SOLN: 20.0000 mL | Freq: Four times a day (QID) | ORAL | Status: DC | PRN

## 2021-04-11 MED ORDER — FUROSEMIDE 10 MG/ML IJ SOLN
20.0000 mg | Freq: Once | INTRAMUSCULAR | Status: AC
  Administered 2021-04-11: 20 mg via INTRAVENOUS
  Filled 2021-04-11: qty 2

## 2021-04-11 NOTE — Progress Notes (Addendum)
PROGRESS NOTE    Monica [name]  EXB:[id number] DOB: 25-May-1934 DOA: 04/04/2021 PCP: Wardell Honour, FNP    Chief Complaint  Patient presents with   Altered Mental Status   Fall    Brief Narrative:  Monica [name] is a 87 y.o. female with medical history significant of dementia, CAD s/p stent placement, CHF EF of 30 to 35%, h/o aortic stenosis followed by cardiology Dr Samuella Cota, History of AAA status post endovascular repair, history of GI bleed, hyperlipidemia,  Presented with severe sepsis, acute metabolic encephalopathy, possible colitis/UTI Found to have acute CVA Need SNF placement with palliative care following  Subjective:  Blood pressure elevated with tachycardia Assessment & Plan:   Principal Problem:   Severe sepsis (HCC) Active Problems:   CAD (coronary artery disease)   Severe aortic stenosis   Abdominal aortic aneurysm (AAA) without rupture (HCC)   HFrEF (heart failure with reduced ejection fraction) (HCC)   Hypokalemia   Hyponatremia   Acute metabolic encephalopathy   Pressure injury of skin   Protein-calorie malnutrition, severe   Acute lower UTI   Acute ischemic left PCA stroke (HCC)   Essential hypertension  Sever sepsis present on admission with UTI/colitis Finished antibiotic treatment, sepsis resolved Now blood pressure started to be elevated, D/c hydration, restart lopressor, lasix  Acute metabolic encephalopathy from sepsis also found to have acute CVA MRI showed left parietal infarct, likely embolic -EEG negative for seizure activities -Seen by neurology, echocardiogram did not show cardio embolic source, neurology recommend prolonged cardiac monitoring outpatient - continue asa (only needs monotherpay from stroke prevention standpoint per neurology), statin, cardiology to arrange zio patch at discharge, ( secure chat with Dr Kirke Corin and assistance)  AKI on CKD 2, hypokalemia, hypophosphatemia, hyponatremia O improved and normalized  History  of CAD status post stent placement, chronic systolic CHF Denies chest pain, appear euvolemic  continue aspirin, Plavix, statin ,resume Lopressor, resume Lasix  ? ILD? Cxr on presentation showed "Chronic interstitial thickening, suspicious for interstitial lung disease. No superimposed acute process" Currently no hypoxia, no cough observed today  Vascular dementia/FTT Slightly confused, but calm and cooperative Will need snf with palliative care   The patient's BMI is: Body mass index is 32.14 kg/m.Marland Kitchen  Seen by dietician.  I agree with the assessment and plan as outlined below:  Nutrition Status: Nutrition Problem: Severe Malnutrition (in the context of acute illness) Etiology: poor appetite (recurrent hospitalizations) Signs/Symptoms: percent weight loss, moderate muscle depletion (6.5% x 1 month) Percent weight loss: 6.5 % Interventions: Refer to RD note for recommendations  .     Skin Assessment:  I have examined the patient's skin and I agree with the wound assessment as performed by the wound care RN as outlined below:  Pressure Injury 04/05/21 Vertebral column Lower;Medial Stage 2 -  Partial thickness loss of dermis presenting as a shallow open injury with a red, pink wound bed without slough. shallow pink wound bed (Active)  04/05/21 1515  Location: Vertebral column  Location Orientation: Lower;Medial  Staging: Stage 2 -  Partial thickness loss of dermis presenting as a shallow open injury with a red, pink wound bed without slough.  Wound Description (Comments): shallow pink wound bed  Present on Admission: Yes    Unresulted Labs (From admission, onward)    None         DVT prophylaxis: heparin injection 5,000 Units Start: 04/05/21 0045   Code Status:DNR Family Communication: At bedside Disposition:   Status is: Inpatient  Dispo: The patient  is from: Home              Anticipated d/c is to: SNF              Anticipated d/c date is: Awaiting for bed               Patient currently is medically stable to discharge  Consultants:  Neurology  Procedures:  None  Antimicrobials:   Anti-infectives (From admission, onward)    Start     Dose/Rate Route Frequency Ordered Stop   04/08/21 1000  cefTRIAXone (ROCEPHIN) 1 g in sodium chloride 0.9 % 100 mL IVPB  Status:  Discontinued        1 g 200 mL/hr over 30 Minutes Intravenous Every 24 hours 04/08/21 0845 04/08/21 1302   04/05/21 0045  ceFEPIme (MAXIPIME) 2 g in sodium chloride 0.9 % 100 mL IVPB  Status:  Discontinued        2 g 200 mL/hr over 30 Minutes Intravenous  Once 04/05/21 0044 04/05/21 0050   04/05/21 0000  ceFEPIme (MAXIPIME) 2 g in sodium chloride 0.9 % 100 mL IVPB  Status:  Discontinued        2 g 200 mL/hr over 30 Minutes Intravenous Every 12 hours 04/04/21 2223 04/08/21 0845   04/04/21 2130  vancomycin (VANCOREADY) IVPB 2000 mg/400 mL        2,000 mg 200 mL/hr over 120 Minutes Intravenous  Once 04/04/21 2104 04/05/21 0037   04/04/21 1845  cefTRIAXone (ROCEPHIN) 1 g in sodium chloride 0.9 % 100 mL IVPB        1 g 200 mL/hr over 30 Minutes Intravenous  Once 04/04/21 1837 04/04/21 1942           Objective: Vitals:   04/10/21 1201 04/10/21 2008 04/10/21 2333 04/11/21 0825  BP: (!) 157/70 (!) 173/97 (!) 162/106 (!) 171/106  Pulse: 96 (!) 103 (!) 109 (!) 109  Resp:  20 18 20   Temp: 98.4 F (36.9 C) 98.2 F (36.8 C) 98 F (36.7 C) 98.7 F (37.1 C)  TempSrc: Oral Oral Oral Oral  SpO2:  100% 100% 97%  Weight:      Height:        Intake/Output Summary (Last 24 hours) at 04/11/2021 0834 Last data filed at 04/11/2021 0600 Gross per 24 hour  Intake 2163.42 ml  Output 700 ml  Net 1463.42 ml   Filed Weights   04/04/21 1245 04/05/21 1506  Weight: 93.7 kg 87.6 kg    Examination:  General exam: calm, NAD, slightly confused,  Respiratory system: Diminished at bases, no wheezing, no rales, no rhonchi, respiratory effort normal. Cardiovascular system: S1 & S2 heard,  RRR.  Gastrointestinal system: Abdomen is nondistended, soft and nontender.  Normal bowel sounds heard. Central nervous system: Alert and oriented to self, know she is in the hospital, not able to name it, she is confused about the time, mild weakness right arm Extremities: no edema Skin: No rashes, lesions or ulcers Psychiatry: Confused, but calm and cooperative    Data Reviewed: I have personally reviewed following labs and imaging studies  CBC: Recent Labs  Lab 04/04/21 1422 04/05/21 0651 04/06/21 0504 04/08/21 0442  WBC 9.0 21.1* 16.6* 8.5  NEUTROABS  --   --  13.6* 5.8  HGB 15.7* 14.2 13.6 11.8*  HCT 45.2 40.9 41.7 36.0  MCV 79.9* 81.0 84.8 83.7  PLT 243 221 246 211    Basic Metabolic Panel: Recent Labs  Lab 04/06/21 0504 04/07/21  1610 04/08/21 0442 04/10/21 0456 04/11/21 0526  NA 137 140 142 140 135  K 4.3 3.4* 3.7 4.0 4.0  CL 104 112* 112* 111 107  CO2 GLUCOSE 107* 98 86 85 113*  BUN CREATININE 0.98 0.81 0.76 0.71 0.72  CALCIUM 7.9* 7.9* 8.0* 8.2* 7.9*  MG 2.2 2.0 1.9 1.9 1.8  PHOS  --  1.7* 2.6 2.0* 2.9    GFR: Estimated Creatinine Clearance: 56.2 mL/min (by C-G formula based on SCr of 0.72 mg/dL).  Liver Function Tests: Recent Labs  Lab 04/04/21 1422 04/05/21 0651  AST 29 25  ALT 23 17  ALKPHOS 193* 148*  BILITOT 3.5* 2.9*  PROT 7.2 6.2*  ALBUMIN 2.7* 2.2*    CBG: Recent Labs  Lab 04/05/21 1503 04/06/21 2059  GLUCAP 110* 98     Recent Results (from the past 240 hour(s))  Resp Panel by RT-PCR (Flu A&B, Covid) Nasopharyngeal Swab     Status: None   Collection Time: 04/04/21  4:36 PM   Specimen: Nasopharyngeal Swab; Nasopharyngeal(NP) swabs in vial transport medium  Result Value Ref Range Status   SARS Coronavirus 2 by RT PCR NEGATIVE NEGATIVE Final    Comment: (NOTE) SARS-CoV-2 target nucleic acids are NOT DETECTED.  The SARS-CoV-2 RNA is generally detectable in upper respiratory specimens during  the acute phase of infection. The lowest concentration of SARS-CoV-2 viral copies this assay can detect is 138 copies/mL. A negative result does not preclude SARS-Cov-2 infection and should not be used as the sole basis for treatment or other patient management decisions. A negative result may occur with  improper specimen collection/handling, submission of specimen other than nasopharyngeal swab, presence of viral mutation(s) within the areas targeted by this assay, and inadequate number of viral copies(<138 copies/mL). A negative result must be combined with clinical observations, patient history, and epidemiological information. The expected result is Negative.  Fact Sheet for Patients:  BloggerCourse.com  Fact Sheet for Healthcare Providers:  SeriousBroker.it  This test is no t yet approved or cleared by the [place] FDA and  has been authorized for detection and/or diagnosis of SARS-CoV-2 by FDA under an Emergency Use Authorization (EUA). This EUA will remain  in effect (meaning this test can be used) for the duration of the COVID-19 declaration under Section 564(b)(1) of the Act, 21 U.S.C.section 360bbb-3(b)(1), unless the authorization is terminated  or revoked sooner.       Influenza A by PCR NEGATIVE NEGATIVE Final   Influenza B by PCR NEGATIVE NEGATIVE Final    Comment: (NOTE) The Xpert Xpress SARS-CoV-2/FLU/RSV plus assay is intended as an aid in the diagnosis of influenza from Nasopharyngeal swab specimens and should not be used as a sole basis for treatment. Nasal washings and aspirates are unacceptable for Xpert Xpress SARS-CoV-2/FLU/RSV testing.  Fact Sheet for Patients: BloggerCourse.com  Fact Sheet for Healthcare Providers: SeriousBroker.it  This test is not yet approved or cleared by the [place] FDA and has been authorized for detection and/or  diagnosis of SARS-CoV-2 by FDA under an Emergency Use Authorization (EUA). This EUA will remain in effect (meaning this test can be used) for the duration of the COVID-19 declaration under Section 564(b)(1) of the Act, 21 U.S.C. section 360bbb-3(b)(1), unless the authorization is terminated or revoked.  Performed at [facility], [address], [place], [place] [address]   Urine Culture     Status: None   Collection Time: 04/04/21  4:36 PM   Specimen: Urine, Random  Result Value Ref Range Status   Specimen Description   Final    URINE, RANDOM Performed at [facility], [address] [place], [place] [address]    Special Requests   Final    NONE Performed at [facility], [address] [place], [place] [address]    Culture   Final    NO GROWTH Performed at [facility], [address] [place], [place] [address]    Report Status 04/06/2021 FINAL  Final  Blood culture (routine x 2)     Status: None   Collection Time: 04/04/21  5:36 PM   Specimen: BLOOD  Result Value Ref Range Status   Specimen Description BLOOD BLOOD LEFT HAND  Final   Special Requests   Final    BOTTLES DRAWN AEROBIC ONLY Blood Culture adequate volume   Culture   Final    NO GROWTH 5 DAYS Performed at [facility], [address] [place], [place] [address]    Report Status 04/09/2021 FINAL  Final  Blood culture (routine x 2)     Status: None   Collection Time: 04/04/21  5:38 PM   Specimen: BLOOD  Result Value Ref Range Status   Specimen Description BLOOD RIGHT ANTECUBITAL  Final   Special Requests   Final    BOTTLES DRAWN AEROBIC AND ANAEROBIC Blood Culture results may not be optimal due to an inadequate volume of blood received in culture bottles   Culture   Final    NO GROWTH 5 DAYS Performed at [facility], [address] [place], [place] [address]    Report Status 04/09/2021 FINAL  Final  MRSA Next Gen by PCR, Nasal     Status: None    Collection Time: 04/06/21  9:25 AM   Specimen: Nasal Mucosa; Nasal Swab  Result Value Ref Range Status   MRSA by PCR Next Gen NOT DETECTED NOT DETECTED Final    Comment: (NOTE) The GeneXpert MRSA Assay (FDA approved for NASAL specimens only), is one component of a comprehensive MRSA colonization surveillance program. It is not intended to diagnose MRSA infection nor to guide or monitor treatment for MRSA infections. Test performance is not FDA approved in patients less than 85 years old. Performed at [facility], [address] [place], [place] [address]          Radiology Studies: EEG adult  Result Date: 04/09/2021 Rejeana BrockKirkpatrick, McNeill P, MD     04/10/2021  9:30 AM History: 87 yo F with AMS Sedation: None Technique: This EEG was acquired with electrodes placed according to the International 10-20 electrode system (including Fp1, Fp2, F3, F4, C3, C4, P3, P4, O1, O2, T3, T4, T5, T6, A1, A2, Fz, Cz, Pz). The following electrodes were missing or displaced: none. Background: There is a posterior dominant rhythm of 9 to 10 Hz as well as frontally predominant beta activity in a normal distribution.  In addition, there is generalized irregular delta and theta range activities that intrude into the background throughout the study. Photic stimulation: Physiologic driving is present EEG Abnormalities: 1) generalized irregular slow activity Clinical Interpretation: This EEG is consistent with a mild generalized nonspecific cerebral dysfunction (encephalopathy). There was no seizure or seizure predisposition recorded on this study. Please note that lack of epileptiform activity on EEG does not preclude the possibility of epilepsy. Ritta SlotMcNeill Kirkpatrick, MD [facility] [phone] If 7pm- 7am, please page neurology on call as listed  in AMION.   ECHOCARDIOGRAM COMPLETE  Result Date: 04/09/2021    ECHOCARDIOGRAM REPORT   Patient Name:   ARLIENE ROSENOW Date of Exam: 04/09/2021  Medical Rec #:  [id number]      Height:       65.0 in Accession #:    [id number]     Weight:       193.1 lb Date of Birth:  1933/10/09      BSA:          1.949 m Patient Age:    87 years       BP:           154/93 mmHg Patient Gender: F              HR:           107 bpm. Exam Location:  [facility] Procedure: 2D Echo, Color Doppler and Cardiac Doppler Indications:     I63.9 Stroke  History:         Patient has prior history of Echocardiogram examinations, most                  recent 01/29/2021. CAD, Aortic Valve Disease; Risk                  Factors:Dyslipidemia.  Sonographer:     Humphrey Rolls Referring Phys:  [id number] ASHISH ARORA Diagnosing Phys: Lorine Bears MD  Sonographer Comments: Suboptimal subcostal window. IMPRESSIONS  1. Left ventricular ejection fraction, by estimation, is 30 to 35%. The left ventricle has moderately decreased function. The left ventricle demonstrates global hypokinesis. There is mild left ventricular hypertrophy. Left ventricular diastolic parameters are indeterminate.  2. Right ventricular systolic function is normal. The right ventricular size is normal. Tricuspid regurgitation signal is inadequate for assessing PA pressure.  3. Left atrial size was mildly dilated.  4. The mitral valve is normal in structure. Mild mitral valve regurgitation. No evidence of mitral stenosis. Severe mitral annular calcification.  5. The aortic valve is abnormal. There is severe calcifcation of the aortic valve. There is severe thickening of the aortic valve. Aortic valve regurgitation is not visualized. Severe aortic valve stenosis. Aortic valve area, by VTI measures 0.49 cm. Aortic valve mean gradient measures 36.0 mmHg.  6. The inferior vena cava is normal in size with greater than 50% respiratory variability, suggesting right atrial pressure of 3 mmHg. FINDINGS  Left Ventricle: Left ventricular ejection fraction, by estimation, is 30 to 35%. The left ventricle has moderately decreased function. The left  ventricle demonstrates global hypokinesis. The left ventricular internal cavity size was normal in size. There is mild left ventricular hypertrophy. Left ventricular diastolic parameters are indeterminate. Right Ventricle: The right ventricular size is normal. No increase in right ventricular wall thickness. Right ventricular systolic function is normal. Tricuspid regurgitation signal is inadequate for assessing PA pressure. Left Atrium: Left atrial size was mildly dilated. Right Atrium: Right atrial size was normal in size. Pericardium: There is no evidence of pericardial effusion. Mitral Valve: The mitral valve is normal in structure. Severe mitral annular calcification. Mild mitral valve regurgitation. No evidence of mitral valve stenosis. MV peak gradient, 14.1 mmHg. The mean mitral valve gradient is 6.0 mmHg. Tricuspid Valve: The tricuspid valve is normal in structure. Tricuspid valve regurgitation is mild . No evidence of tricuspid stenosis. Aortic Valve: The aortic valve is abnormal. There is severe calcifcation of the aortic valve. There is severe thickening of the aortic valve. Aortic valve regurgitation is not visualized. Severe  aortic stenosis is present. Aortic valve mean gradient measures 36.0 mmHg. Aortic valve peak gradient measures 55.1 mmHg. Aortic valve area, by VTI measures 0.49 cm. Pulmonic Valve: The pulmonic valve was normal in structure. Pulmonic valve regurgitation is not visualized. No evidence of pulmonic stenosis. Aorta: The aortic root is normal in size and structure. Venous: The inferior vena cava is normal in size with greater than 50% respiratory variability, suggesting right atrial pressure of 3 mmHg. IAS/Shunts: No atrial level shunt detected by color flow Doppler.  LEFT VENTRICLE PLAX 2D LVIDd:         3.90 cm     Diastology LVIDs:         3.40 cm     LV e' medial:    8.70 cm/s LV PW:         1.10 cm     LV E/e' medial:  20.3 LV IVS:        0.80 cm     LV e' lateral:   12.80 cm/s  LVOT diam:     1.90 cm     LV E/e' lateral: 13.8 LV SV:         36 LV SV Index:   19 LVOT Area:     2.84 cm  LV Volumes (MOD) LV vol d, MOD A2C: 84.4 ml LV vol d, MOD A4C: 93.4 ml LV vol s, MOD A2C: 62.5 ml LV vol s, MOD A4C: 52.9 ml LV SV MOD A2C:     21.9 ml LV SV MOD A4C:     93.4 ml LV SV MOD BP:      32.1 ml LEFT ATRIUM             Index LA diam:        4.10 cm 2.10 cm/m LA Vol (A2C):   69.5 ml 35.66 ml/m LA Vol (A4C):   65.7 ml 33.71 ml/m LA Biplane Vol: 73.0 ml 37.46 ml/m  AORTIC VALVE                    PULMONIC VALVE AV Area (Vmax):    0.58 cm     PV Vmax:       0.76 m/s AV Area (Vmean):   0.50 cm     PV Vmean:      52.800 cm/s AV Area (VTI):     0.49 cm     PV VTI:        0.110 m AV Vmax:           371.00 cm/s  PV Peak grad:  2.3 mmHg AV Vmean:          287.000 cm/s PV Mean grad:  1.0 mmHg AV VTI:            0.737 m AV Peak Grad:      55.1 mmHg AV Mean Grad:      36.0 mmHg LVOT Vmax:         75.40 cm/s LVOT Vmean:        50.200 cm/s LVOT VTI:          0.128 m LVOT/AV VTI ratio: 0.17  AORTA Ao Root diam: 3.20 cm MITRAL VALVE MV Area (PHT): 5.02 cm     SHUNTS MV Area VTI:   1.28 cm     Systemic VTI:  0.13 m MV Peak grad:  14.1 mmHg    Systemic Diam: 1.90 cm MV Mean grad:  6.0 mmHg MV Vmax:  1.88 m/s MV Vmean:      107.0 cm/s MV Decel Time: 151 msec MV E velocity: 177.00 cm/s Lorine Bears MD Electronically signed by Lorine Bears MD Signature Date/Time: 04/09/2021/1:51:27 PM    Final         Scheduled Meds:  aspirin  300 mg Rectal Daily   Or   aspirin  325 mg Oral Daily   chlorhexidine  15 mL Mouth Rinse BID   Chlorhexidine Gluconate Cloth  6 each Topical Daily   feeding supplement  237 mL Oral BID BM   furosemide  20 mg Intravenous Once   guaiFENesin  20 mL Oral Q4H   heparin  5,000 Units Subcutaneous Q8H   mouth rinse  15 mL Mouth Rinse q12n4p   metoprolol tartrate  12.5 mg Oral BID   oxyCODONE-acetaminophen  1 tablet Oral Q12H   QUEtiapine  25 mg Oral QHS   rosuvastatin   40 mg Oral Daily   Continuous Infusions:   LOS: 7 days   Time spent: , case discussed with neurology Dr. Amada Jupiter in eprson  and cardiology Dr. Tresa Res and associate through secure chat  Greater than 50% of this time was spent in counseling, explanation of diagnosis, planning of further management, and coordination of care.   Voice Recognition Reubin Milan dictation system was used to create this note, attempts have been made to correct errors. Please contact the author with questions and/or clarifications.   Albertine Grates, MD PhD FACP [facility]  Available via Epic secure chat 7am-7pm for nonurgent issues Please page for urgent issues To page the attending provider between 7A-7P or the covering provider during after hours 7P-7A, please log into the web site [email or web address] and access using universal [place] password for that web site. If you do not have the password, please call the hospital operator.    04/11/2021, 8:34 AM

## 2021-04-12 ENCOUNTER — Inpatient Hospital Stay
Admit: 2021-04-12 | Discharge: 2021-04-12 | Disposition: A | Discharge status: Home / Self Care | Payer: Medicare Other | Attending: Physician Assistant | Admitting: Physician Assistant

## 2021-04-12 ENCOUNTER — Other Ambulatory Visit: Payer: Self-pay | Admitting: Physician Assistant

## 2021-04-12 DIAGNOSIS — I639 Cerebral infarction, unspecified: Secondary | ICD-10-CM

## 2021-04-12 LAB — BASIC METABOLIC PANEL
Anion gap: 5 (ref 5–15)
BUN: 13 mg/dL (ref 8–23)
CO2: 26 mmol/L (ref 22–32)
Calcium: 8.2 mg/dL — ABNORMAL LOW (ref 8.9–10.3)
Chloride: 102 mmol/L (ref 98–111)
Creatinine, Ser: 0.73 mg/dL (ref 0.44–1.00)
GFR, Estimated: >60 mL/min (ref >60)
Glucose, Bld: 79 mg/dL (ref 70–99)
Potassium: 4.5 mmol/L (ref 3.5–5.1)
Sodium: 133 mmol/L — ABNORMAL LOW (ref 135–145)

## 2021-04-12 LAB — MAGNESIUM: Magnesium: 1.7 mg/dL (ref 1.7–2.4)

## 2021-04-12 MED ORDER — MAGNESIUM SULFATE IN D5W 1-5 GM/100ML-% IV SOLN
1.0000 g | Freq: Once | INTRAVENOUS | Status: AC
  Administered 2021-04-12: 1 g via INTRAVENOUS
  Filled 2021-04-12: qty 100

## 2021-04-12 MED ORDER — ENOXAPARIN SODIUM 60 MG/0.6ML IJ SOSY
0.5000 mg/kg | PREFILLED_SYRINGE | INTRAMUSCULAR | Status: DC
  Administered 2021-04-12 – 2021-04-13 (×2): 45 mg via SUBCUTANEOUS
  Filled 2021-04-12 (×2): qty 0.6

## 2021-04-12 NOTE — Addendum Note (Signed)
Encounter addended by: Elberta Leatherwood, RRT on: 04/12/2021 10:53 AM  Actions taken: Imaging Exam begun

## 2021-04-12 NOTE — Addendum Note (Signed)
Encounter addended by: Karn Cassis, RT on: 04/12/2021 10:37 AM  Actions taken: Imaging Exam begun

## 2021-04-12 NOTE — Progress Notes (Signed)
Nutrition Follow-up  DOCUMENTATION CODES:  Severe malnutrition in context of acute illness/injury  INTERVENTION:  Continue current diet as tolerated per pt Ensure Enlive po BID, each supplement provides 350 kcal and 20 grams of protein Initiate 48h kcal count to assess nutrition intake Request new measured weight to assess for further loss  NUTRITION DIAGNOSIS:  Severe Malnutrition (in the context of acute illness) related to poor appetite (recurrent hospitalizations) as evidenced by percent weight loss, moderate muscle depletion (6.5% x 1 month).  GOAL:  Patient will meet greater than or equal to 90% of their needs  MONITOR:  PO intake, Supplement acceptance, Skin, Weight trends  REASON FOR ASSESSMENT:  Malnutrition Screening Tool    ASSESSMENT:  87 y.o. female with PMH of CAD, NSTEMI, HLD, CHF (EF 30 to 35%), and mild dementia who presented to ED with AMS worsening over the last several days. Found to be septic related to UTI.  Pt has been hospitalized several times in the last few months and followed by RD service at that time. Reviewed weight hx and 6.5% weight loss noted over the last ~1 month which is severe.  Noted that during last admission pt was followed by palliative care and pt expressed on multiple occasions she would not want a feeding tube, CPR, or intubation.  Pt sleeping at the time of assessment, did not wake when name was called. SLP is following pt and previously recommended a full liquid diet for patient due to gagging and need for bland foods. Able to be upgraded to a DYS 2 diet today and per SLP - ok for family to bring in solid food items that pt likes. MD requested calorie count begin today as family is concerned with intake. Palliative care also assessing to help with GOC discussions. Did talk with MD about concerns with pt's severe weight loss and increased muscle and fat depletions since last admission pointing to a long term decline in intake versus just  during hospitalization.  Average Meal Intake: 8/25-9/1: 0% intake x 2 recorded meals  Nutritionally Relevant Medications: Scheduled Meds:  feeding supplement  237 mL Oral BID BM   rosuvastatin  40 mg Oral Daily   senna-docusate  1 tablet Oral BID   PRN Meds: ondansetron  Labs Reviewed Na 133  NUTRITION - FOCUSED PHYSICAL EXAM: Flowsheet Row Most Recent Value  Orbital Region Mild depletion  Upper Arm Region No depletion  Thoracic and Lumbar Region No depletion  Buccal Region Mild depletion  Temple Region Severe depletion  Clavicle Bone Region No depletion  Clavicle and Acromion Bone Region No depletion  Scapular Bone Region No depletion  Dorsal Hand Mild depletion  Patellar Region Moderate depletion  Anterior Thigh Region Moderate depletion  Posterior Calf Region Moderate depletion  Edema (RD Assessment) Moderate  [edema present to all extremities]  Hair Reviewed  Eyes Reviewed  Mouth Reviewed  [missing/broken teeth observed]  Skin Reviewed  [scattered bruising, particularly to left lower leg]  Nails Reviewed   Diet Order:   Diet Order             DIET DYS 2 Room service appropriate? Yes; Fluid consistency: Thin  Diet effective now                   EDUCATION NEEDS:  No education needs have been identified at this time  Skin:  Skin Assessment: Skin Integrity Issues: Skin Integrity Issues:: Stage II Stage II: back  Last BM:  8/28  Height:  Ht Readings  from Last 1 Encounters:  04/05/21 5\' 5"  (1.651 m)    Weight:  Wt Readings from Last 1 Encounters:  04/05/21 87.6 kg    Ideal Body Weight:  56.8 kg  BMI:  Body mass index is 32.14 kg/m.  Estimated Nutritional Needs:  Kcal:  1700-1900 kcal/d Protein:  85-95 g/d Fluid:  1.6-1.8 L/d   04/07/21, RD, LDN Clinical Dietitian Pager on Amion

## 2021-04-12 NOTE — TOC Progression Note (Signed)
Transition of Care [facility]) - Progression Note    Patient Details  Name: Monica [name] MRN: [id number] Date of Birth: 1934/01/09  [facility]) CM/SW Contact  Allayne Butcher, RN Phone Number: 04/12/2021, 9:10 AM  Clinical Narrative:    MD reports patient is medically ready for discharge.  RNCM spoke with patient' son, [name] who is concerned about discharge at this time because the patient has not been eating much. [name] does report that patient will return home and not go to SNF.  They will need EMS transport.  RNCM asked MD to speak with son about discharge.    Expected Discharge Plan: Home w Home Health Services Barriers to Discharge: Continued Medical Work up  Expected Discharge Plan and Services Expected Discharge Plan: Home w Home Health Services   Discharge Planning Services: CM Consult Post Acute Care Choice: Home Health Living arrangements for the past 2 months: Single Family Home                 DME Arranged: N/A DME Agency: NA       HH Arranged: RN, PT, OT, Nurse's Aide HH Agency: [facility] Date HH Agency Contacted: 04/07/21 Time HH Agency Contacted: 1426 Representative spoke with at [facility] Agency: Voicemail left for [place]   Social Determinants of Health (SDOH) Interventions    Readmission Risk Interventions Readmission Risk Prevention Plan 04/07/2021  Transportation Screening Complete  PCP or Specialist Appt within 3-5 Days Complete  HRI or Home Care Consult Complete  Social Work Consult for Recovery Care Planning/Counseling Complete  Palliative Care Screening Not Applicable  Medication Review Oceanographer) Complete  Some recent data might be hidden

## 2021-04-12 NOTE — Progress Notes (Signed)
PT Cancellation Note  Patient Details Name: Monica [name] MRN: [id number] DOB: February 20, 1934   Cancelled Treatment:     PT attempt. Pt politely refused requesting author return in AM. PT will continue to follow and progress as able per POC.    Jetta Lout PTA 04/12/21, 3:53 PM

## 2021-04-12 NOTE — Progress Notes (Signed)
   04/12/21 1300  Intake (mL)  P.O. 120 mL  Percent Meals Eaten (%) 0 %   Calorie count ticket not in door pocket due to tray taken before this nurse could see amount ate. data shown is from dietary collection services

## 2021-04-12 NOTE — Addendum Note (Signed)
Encounter addended by: Elberta Leatherwood, RRT on: 04/12/2021 11:04 AM  Actions taken: Imaging Exam begun

## 2021-04-12 NOTE — Addendum Note (Signed)
Encounter addended by: Elberta Leatherwood, RRT on: 04/12/2021 10:57 AM  Actions taken: Imaging Exam begun

## 2021-04-12 NOTE — Progress Notes (Signed)
Pt ate cup of ice cream, applesauce, and 75% of vanilla yogurt with 2- 4 oz cups of grape juice for breakfast.

## 2021-04-12 NOTE — TOC Progression Note (Signed)
Transition of Care [facility]) - Progression Note    Patient Details  Name: Monica [name] MRN: [id number] Date of Birth: 1934/07/05  Transition of Care [facility]) CM/SW Contact  Allayne Butcher, RN Phone Number: 04/12/2021, 9:14 AM  Clinical Narrative:    Palliative Care consult to be ordered to discuss goals of care with family.    Expected Discharge Plan: Home w Home Health Services Barriers to Discharge: Continued Medical Work up  Expected Discharge Plan and Services Expected Discharge Plan: Home w Home Health Services   Discharge Planning Services: CM Consult Post Acute Care Choice: Home Health Living arrangements for the past 2 months: Single Family Home                 DME Arranged: N/A DME Agency: NA       HH Arranged: RN, PT, OT, Nurse's Aide HH Agency: [facility] Date HH Agency Contacted: 04/07/21 Time HH Agency Contacted: 1426 Representative spoke with at [facility] Agency: Voicemail left for [place]   Social Determinants of Health (SDOH) Interventions    Readmission Risk Interventions Readmission Risk Prevention Plan 04/07/2021  Transportation Screening Complete  PCP or Specialist Appt within 3-5 Days Complete  HRI or Home Care Consult Complete  Social Work Consult for Recovery Care Planning/Counseling Complete  Palliative Care Screening Not Applicable  Medication Review Oceanographer) Complete  Some recent data might be hidden

## 2021-04-12 NOTE — Progress Notes (Unsigned)
Repeat Zio AT x2 weeks ordered for CVA - first order did not process correctly.

## 2021-04-12 NOTE — Progress Notes (Signed)
Cardiac monitor with Zio AT x14 days requested for left MCA/PCA stroke and to be placed at time of discharge.    Will reach out to respiratory therapy regarding placement.

## 2021-04-12 NOTE — Addendum Note (Signed)
Encounter addended by: Karn Cassis, RT on: 04/12/2021 10:40 AM  Actions taken: Imaging Exam begun

## 2021-04-12 NOTE — Progress Notes (Signed)
Occupational Therapy Treatment Patient Details Name: Monica [name] MRN: [id number] DOB: 1934/01/06 Today's Date: 04/12/2021    History of present illness 87 y.o. female presented to ED with AMS. Pt dx with severe sepsis. Imaging from CT reveals multiple subacute infarcts - cortical and subcortical hypodensities in left greater than right occipital lobes with possible basal ganglia and cerebellar involvement. Medical history significant of dementia, coronary artery disease with reported EF of 30 to 35%, essential hypertension, history of AAA status post endovascular repair, history of GI bleed, hyperlipidemia, morbid obesity, history of aortic stenosis, s/p intramedullary nail and multiple cardiac cath.   OT comments  Ms. Shimko is considerably more alert and has greater mobility than when seen for OT evaluation 3 days ago. She is able to engage in limited conversation and to inconsistently follow simple one-step commands. Engaged in bed-level UE therex, able to spontaneously pick up cup and drink through straw. Family present in room throughout session, engaged in discussion re: POC and DC options. They remain committed to caring for pt at her home.    Follow Up Recommendations  Home health OT    Equipment Recommendations  None recommended by OT    Recommendations for Other Services      Precautions / Restrictions Precautions Precautions: Fall Restrictions Weight Bearing Restrictions: No Other Position/Activity Restrictions: bed level mobility at baseline       Mobility Bed Mobility Overal bed mobility: Needs Assistance Bed Mobility: Rolling Rolling: +2 for physical assistance;Max assist         General bed mobility comments: Pt able to assist minimally with rolling in bed, could reach LUE contralaterally    Transfers                 General transfer comment: deferred - does not perform at baseline    Balance Overall balance assessment: Needs assistance    Sitting balance-Leahy Scale: Zero       Standing balance-Leahy Scale: Zero                             ADL either performed or assessed with clinical judgement   ADL Overall ADL's : Needs assistance/impaired Eating/Feeding: Minimal assistance                                           Vision       Perception     Praxis      Cognition Arousal/Alertness: Awake/alert Behavior During Therapy: WFL for tasks assessed/performed Overall Cognitive Status: History of cognitive impairments - at baseline                                          Exercises Other Exercises Other Exercises: BUE AA/PROM for all ranges   Shoulder Instructions       General Comments      Pertinent Vitals/ Pain       Faces Pain Scale: Hurts even more Pain Location: back, knees Pain Descriptors / Indicators: Grimacing Pain Intervention(s): Limited activity within patient's tolerance;Monitored during session;Repositioned  Home Living  Prior Functioning/Environment              Frequency  Min 1X/week        Progress Toward Goals  OT Goals(current goals can now be found in the care plan section)  Progress towards OT goals: Progressing toward goals  Acute Rehab OT Goals Patient Stated Goal: to keep pt home and comfortable OT Goal Formulation: With family Time For Goal Achievement: 04/23/21 Potential to Achieve Goals: Good  Plan Discharge plan remains appropriate;Frequency remains appropriate    Co-evaluation                 AM-PAC OT "6 Clicks" Daily Activity     Outcome Measure   Help from another person eating meals?: A Little Help from another person taking care of personal grooming?: A Lot Help from another person toileting, which includes using toliet, bedpan, or urinal?: Total Help from another person bathing (including washing, rinsing, drying)?: Total Help  from another person to put on and taking off regular upper body clothing?: A Lot Help from another person to put on and taking off regular lower body clothing?: Total 6 Click Score: 10    End of Session Equipment Utilized During Treatment: Oxygen  OT Visit Diagnosis: Muscle weakness (generalized) (M62.81);Other abnormalities of gait and mobility (R26.89)   Activity Tolerance Patient tolerated treatment well   Patient Left in bed;with family/visitor present;with call bell/phone within reach;with bed alarm set   Nurse Communication          Time: 5638-9373 OT Time Calculation (min): 16 min  Charges: OT General Charges $OT Visit: 1 Visit OT Treatments $Self Care/Home Management : 8-22 mins  Latina Craver, PhD, MS, OTR/L 04/12/21, 12:22 PM

## 2021-04-12 NOTE — Addendum Note (Signed)
Encounter addended by: Elberta Leatherwood, RRT on: 04/12/2021 11:03 AM  Actions taken: Imaging Exam begun

## 2021-04-12 NOTE — Progress Notes (Addendum)
PROGRESS NOTE    Monica [name]  IRS:[id number] DOB: 01/30/1934 DOA: 04/04/2021 PCP: Wardell Honour, FNP    Chief Complaint  Patient presents with   Altered Mental Status   Fall    Brief Narrative:  Monica [name] is a 87 y.o. female with medical history significant of dementia, CAD s/p stent placement, CHF EF of 30 to 35%, h/o aortic stenosis followed by cardiology Dr Samuella Cota, History of AAA status post endovascular repair, history of GI bleed, hyperlipidemia,  Presented with severe sepsis, acute metabolic encephalopathy, possible colitis/UTI Found to have acute CVA Need SNF placement with palliative care following  Subjective:  Blood pressure improved,  tachycardia resolved She is on 2liter oxygen at rest Calorie count started to monitor oral intake, palliative consulted for goals of care discussion Assessment & Plan:   Principal Problem:   Severe sepsis (HCC) Active Problems:   CAD (coronary artery disease)   Severe aortic stenosis   Abdominal aortic aneurysm (AAA) without rupture (HCC)   HFrEF (heart failure with reduced ejection fraction) (HCC)   Hypokalemia   Hyponatremia   Acute metabolic encephalopathy   Pressure injury of skin   Protein-calorie malnutrition, severe   Acute lower UTI   Acute ischemic left PCA stroke (HCC)   Essential hypertension  Sever sepsis present on admission with UTI/colitis Finished antibiotic treatment, sepsis resolved Restarted lopressor, lasix  Acute metabolic encephalopathy from sepsis also found to have acute CVA MRI showed left parietal infarct, likely embolic -EEG negative for seizure activities -Seen by neurology, echocardiogram did not show cardio embolic source, neurology recommend prolonged cardiac monitoring outpatient - continue asa (only needs monotherpay from stroke prevention standpoint per neurology), statin, cardiology to arrange zio patch at discharge, ( secure chat with Dr Kirke Corin and assistance)  AKI on CKD 2,  hypokalemia, hypophosphatemia, hyponatremia O improved and normalized  Hypomagnesemia Replace mag  History of CAD status post stent placement, chronic systolic CHF Denies chest pain, appear euvolemic  continue aspirin, Plavix, statin ,resume Lopressor, resume Lasix  ? ILD? Cxr on presentation showed "Chronic interstitial thickening, suspicious for interstitial lung disease. No superimposed acute process" Currently no hypoxia, no cough observed today Currently she is on 2liter o2, will try to wean  Vascular dementia/FTT Slightly confused, but calm and cooperative Will need snf with palliative care   The patient's BMI is: Body mass index is 32.14 kg/m.Marland Kitchen  Seen by dietician.  I agree with the assessment and plan as outlined below:  Nutrition Status: Nutrition Problem: Severe Malnutrition (in the context of acute illness) Etiology: poor appetite (recurrent hospitalizations) Signs/Symptoms: percent weight loss, moderate muscle depletion (6.5% x 1 month) Percent weight loss: 6.5 % Interventions: Refer to RD note for recommendations  .     Skin Assessment:  I have examined the patient's skin and I agree with the wound assessment as performed by the wound care RN as outlined below:  Pressure Injury 04/05/21 Vertebral column Lower;Medial Stage 2 -  Partial thickness loss of dermis presenting as a shallow open injury with a red, pink wound bed without slough. shallow pink wound bed (Active)  04/05/21 1515  Location: Vertebral column  Location Orientation: Lower;Medial  Staging: Stage 2 -  Partial thickness loss of dermis presenting as a shallow open injury with a red, pink wound bed without slough.  Wound Description (Comments): shallow pink wound bed  Present on Admission: Yes    Unresulted Labs (From admission, onward)    None  DVT prophylaxis: lovenox   Code Status:DNR Family Communication: son over the phone Disposition:   Status is:  Inpatient  Dispo: The patient is from: Home              Anticipated d/c is to: TBD              Anticipated d/c date is: TBD, likely home with home health/outpatient palliative care, family declined snf              Family is concerned about oral intake, calorie count is underway, palliative care consulted for goals of care discussion  Consultants:  Neurology Palliative care  Procedures:  None  Antimicrobials:   Anti-infectives (From admission, onward)    Start     Dose/Rate Route Frequency Ordered Stop   04/08/21 1000  cefTRIAXone (ROCEPHIN) 1 g in sodium chloride 0.9 % 100 mL IVPB  Status:  Discontinued        1 g 200 mL/hr over 30 Minutes Intravenous Every 24 hours 04/08/21 0845 04/08/21 1302   04/05/21 0045  ceFEPIme (MAXIPIME) 2 g in sodium chloride 0.9 % 100 mL IVPB  Status:  Discontinued        2 g 200 mL/hr over 30 Minutes Intravenous  Once 04/05/21 0044 04/05/21 0050   04/05/21 0000  ceFEPIme (MAXIPIME) 2 g in sodium chloride 0.9 % 100 mL IVPB  Status:  Discontinued        2 g 200 mL/hr over 30 Minutes Intravenous Every 12 hours 04/04/21 2223 04/08/21 0845   04/04/21 2130  vancomycin (VANCOREADY) IVPB 2000 mg/400 mL        2,000 mg 200 mL/hr over 120 Minutes Intravenous  Once 04/04/21 2104 04/05/21 0037   04/04/21 1845  cefTRIAXone (ROCEPHIN) 1 g in sodium chloride 0.9 % 100 mL IVPB        1 g 200 mL/hr over 30 Minutes Intravenous  Once 04/04/21 1837 04/04/21 1942           Objective: Vitals:   04/11/21 2009 04/11/21 2300 04/12/21 0412 04/12/21 0825  BP: (!) 159/100 133/67 (!) 150/75 (!) 155/95  Pulse: (!) 102 91 91 97  Resp: 15 18 17 15   Temp: 98.1 F (36.7 C) 99.1 F (37.3 C) 98.8 F (37.1 C) 98 F (36.7 C)  TempSrc:  Axillary    SpO2: 93% 95% 94% 93%  Weight:      Height:        Intake/Output Summary (Last 24 hours) at 04/12/2021 1556 Last data filed at 04/12/2021 1300 Gross per 24 hour  Intake 120 ml  Output 3500 ml  Net -3380 ml   Filed  Weights   04/04/21 1245 04/05/21 1506  Weight: 93.7 kg 87.6 kg    Examination:  General exam: Chronically ill appearing, calm, NAD, slightly confused,  Respiratory system: Diminished at bases, no wheezing, no rales, no rhonchi, respiratory effort normal. Cardiovascular system: S1 & S2 heard, RRR.  Gastrointestinal system: Abdomen is nondistended, soft and nontender.  Normal bowel sounds heard. Central nervous system: Alert and oriented to self, know she is in the hospital, not able to name it, she is confused about the time, mild weakness right arm Extremities: no edema Skin: No rashes, lesions or ulcers Psychiatry: Confused, but calm and cooperative    Data Reviewed: I have personally reviewed following labs and imaging studies  CBC: Recent Labs  Lab 04/06/21 0504 04/08/21 0442  WBC 16.6* 8.5  NEUTROABS 13.6* 5.8  HGB 13.6 11.8*  HCT 41.7 36.0  MCV 84.8 83.7  PLT 246 211    Basic Metabolic Panel: Recent Labs  Lab 04/07/21 0437 04/08/21 0442 04/10/21 0456 04/11/21 0526 04/12/21 0449  NA 140 142 140 135 133*  K 3.4* 3.7 4.0 4.0 4.5  CL 112* 112* 111 107 102  CO2 25 25 24 23 26   GLUCOSE 98 86 85 113* 79  BUN 17 15 12 13 13   CREATININE 0.81 0.76 0.71 0.72 0.73  CALCIUM 7.9* 8.0* 8.2* 7.9* 8.2*  MG 2.0 1.9 1.9 1.8 1.7  PHOS 1.7* 2.6 2.0* 2.9  --     GFR: Estimated Creatinine Clearance: 56.2 mL/min (by C-G formula based on SCr of 0.73 mg/dL).  Liver Function Tests: No results for input(s): AST, ALT, ALKPHOS, BILITOT, PROT, ALBUMIN in the last 168 hours.   CBG: Recent Labs  Lab 04/06/21 2059  GLUCAP 98     Recent Results (from the past 240 hour(s))  Resp Panel by RT-PCR (Flu A&B, Covid) Nasopharyngeal Swab     Status: None   Collection Time: 04/04/21  4:36 PM   Specimen: Nasopharyngeal Swab; Nasopharyngeal(NP) swabs in vial transport medium  Result Value Ref Range Status   SARS Coronavirus 2 by RT PCR NEGATIVE NEGATIVE Final    Comment:  (NOTE) SARS-CoV-2 target nucleic acids are NOT DETECTED.  The SARS-CoV-2 RNA is generally detectable in upper respiratory specimens during the acute phase of infection. The lowest concentration of SARS-CoV-2 viral copies this assay can detect is 138 copies/mL. A negative result does not preclude SARS-Cov-2 infection and should not be used as the sole basis for treatment or other patient management decisions. A negative result may occur with  improper specimen collection/handling, submission of specimen other than nasopharyngeal swab, presence of viral mutation(s) within the areas targeted by this assay, and inadequate number of viral copies(<138 copies/mL). A negative result must be combined with clinical observations, patient history, and epidemiological information. The expected result is Negative.  Fact Sheet for Patients:  2060  Fact Sheet for Healthcare Providers:  04/06/21  This test is no t yet approved or cleared by the [employer] FDA and  has been authorized for detection and/or diagnosis of SARS-CoV-2 by FDA under an Emergency Use Authorization (EUA). This EUA will remain  in effect (meaning this test can be used) for the duration of the COVID-19 declaration under Section 564(b)(1) of the Act, 21 U.S.C.section 360bbb-3(b)(1), unless the authorization is terminated  or revoked sooner.       Influenza A by PCR NEGATIVE NEGATIVE Final   Influenza B by PCR NEGATIVE NEGATIVE Final    Comment: (NOTE) The Xpert Xpress SARS-CoV-2/FLU/RSV plus assay is intended as an aid in the diagnosis of influenza from Nasopharyngeal swab specimens and should not be used as a sole basis for treatment. Nasal washings and aspirates are unacceptable for Xpert Xpress SARS-CoV-2/FLU/RSV testing.  Fact Sheet for Patients: [employer]ious[employer]ker[employer]it  Fact Sheet for Healthcare  Providers: [place]  This test is not yet approved or cleared by the [employer] FDA and has been authorized for detection and/or diagnosis of SARS-CoV-2 by FDA under an Emergency Use Authorization (EUA). This EUA will remain in effect (meaning this test can be used) for the duration of the COVID-19 declaration under Section 564(b)(1) of the Act, 21 U.S.C. section 360bbb-3(b)(1), unless the authorization is terminated or revoked.  Performed at [facility], [address], [place], [address] [place]   Urine Culture     Status: None  Collection Time: 04/04/21  4:36 PM   Specimen: Urine, Random  Result Value Ref Range Status   Specimen Description   Final    URINE, RANDOM Performed at [facility], [address], [place], [place] [address]    Special Requests   Final    NONE Performed at [facility], [address], [place], [place] [address]    Culture   Final    NO GROWTH Performed at [facility], [address], [place], [place] [address]    Report Status 04/06/2021 FINAL  Final  Blood culture (routine x 2)     Status: None   Collection Time: 04/04/21  5:36 PM   Specimen: BLOOD  Result Value Ref Range Status   Specimen Description BLOOD BLOOD LEFT HAND  Final   Special Requests   Final    BOTTLES DRAWN AEROBIC ONLY Blood Culture adequate volume   Culture   Final    NO GROWTH 5 DAYS Performed at [facility], [address] [place], [place] [address]    Report Status 04/09/2021 FINAL  Final  Blood culture (routine x 2)     Status: None   Collection Time: 04/04/21  5:38 PM   Specimen: BLOOD  Result Value Ref Range Status   Specimen Description BLOOD RIGHT ANTECUBITAL  Final   Special Requests   Final    BOTTLES DRAWN AEROBIC AND ANAEROBIC Blood Culture results may not be optimal due to an inadequate volume of blood received in culture bottles   Culture   Final    NO GROWTH 5  DAYS Performed at [facility], [address] [place], [place] [address]    Report Status 04/09/2021 FINAL  Final  MRSA Next Gen by PCR, Nasal     Status: None   Collection Time: 04/06/21  9:25 AM   Specimen: Nasal Mucosa; Nasal Swab  Result Value Ref Range Status   MRSA by PCR Next Gen NOT DETECTED NOT DETECTED Final    Comment: (NOTE) The GeneXpert MRSA Assay (FDA approved for NASAL specimens only), is one component of a comprehensive MRSA colonization surveillance program. It is not intended to diagnose MRSA infection nor to guide or monitor treatment for MRSA infections. Test performance is not FDA approved in patients less than 45 years old. Performed at [facility], [address], [place], [place] [address]          Radiology Studies: No results found.      Scheduled Meds:  aspirin EC  81 mg Oral Daily   chlorhexidine  15 mL Mouth Rinse BID   Chlorhexidine Gluconate Cloth  6 each Topical Daily   clopidogrel  75 mg Oral Daily   enoxaparin (LOVENOX) injection  0.5 mg/kg Subcutaneous Q24H   feeding supplement  237 mL Oral BID BM   mouth rinse  15 mL Mouth Rinse q12n4p   metoprolol tartrate  12.5 mg Oral BID   QUEtiapine  25 mg Oral QHS   rosuvastatin  40 mg Oral Daily   senna-docusate  1 tablet Oral BID   Continuous Infusions:   LOS: 8 days   Time spent: , case discussed with palliative care through secure chat   Greater than 50% of this time was spent in counseling, explanation of diagnosis, planning of further management, and coordination of care.   Voice Recognition Reubin [place] dictation system was used to create this note, attempts have been made to correct errors. Please contact the author with  questions and/or clarifications.   Albertine Grates, MD PhD FACP [facility]  Available via Epic secure chat 7am-7pm for nonurgent issues Please page for urgent issues To page the attending provider between 7A-7P or the covering  provider during after hours 7P-7A, please log into the web site [email or web address] and access using universal [place] password for that web site. If you do not have the password, please call the hospital operator.    04/12/2021, 3:56 PM

## 2021-04-12 NOTE — Progress Notes (Signed)
  Speech Language Pathology Treatment:    Patient Details Name: Monica [name] MRN: [id number] DOB: 03-20-1934 Today's Date: 04/12/2021 Time: 250-320 -     Assessment / Plan / Recommendation Clinical Impression  Pt visited today for ongoing dysphagia tx. Pt is on a full liquid diet as she often presented with gagging and dry heaving with other foods. Today swallowing was reassessed with solids ([place] sandwich) to see if Pt can tolerate a diet upgrade. Pt was able to chew solid foods, but needed extended time to masticate and propel for a swallow. Moderate oral residue after the swallow which decreased with alternating liquids. Pt stopped after a few bites stating lay me back" Rec trying Dys 2 diet. Pt fatigues easily and will need periods of rest during meals. Family may bring food from home and supervise. Intake recorded.  HPI : Monica [name] is a 87 y.o. female with medical history significant of dementia, coronary artery disease with reported EF of 30 to 35%,, essential hypertension, History of AAA status post endovascular repair, history of GI bleed, hyperlipidemia, morbid obesity, history of aortic stenosis, status post intramedullary nail and multiple cardiac cath who also follows up with Dr. Kirke Corin for her cardiology and aggressive [facility] brought in today due to altered mental status.  Patient usually is awake and communicating although bedbound.  She is completely obtunded at the moment.  Not arousable only moaning.  There was concern about possible UTI.  Also patient was reportedly found on the floor having fallen out of bed..  She appears to be tender in the right upper quadrant on initial evaluation suggestive of possible gallbladder disease.  Patient's son Monica [name] is primary historian.  At this point patient is suspected to have severe sepsis with hypoxia, possible cholelithiasis and UTI     HPI         See above                               SLP Plan  ST to follow up with  toleration of Dys 2 diet.          Recommendations: Family may bring in foods from home to encourage PO's                           GO                Monica [name] 04/12/2021, 3:04 PM

## 2021-04-12 NOTE — Addendum Note (Signed)
Encounter addended by: Elberta Leatherwood, RRT on: 04/12/2021 10:51 AM  Actions taken: Imaging Exam begun

## 2021-04-12 NOTE — Addendum Note (Signed)
Encounter addended by: Elberta Leatherwood, RRT on: 04/12/2021 10:59 AM  Actions taken: Imaging Exam begun

## 2021-04-13 DIAGNOSIS — A419 Sepsis, unspecified organism: Principal | ICD-10-CM

## 2021-04-13 DIAGNOSIS — Z7189 Other specified counseling: Secondary | ICD-10-CM

## 2021-04-13 DIAGNOSIS — R6520 Severe sepsis without septic shock: Secondary | ICD-10-CM

## 2021-04-13 LAB — COMPREHENSIVE METABOLIC PANEL
ALT: 19 U/L (ref 0–44)
AST: 31 U/L (ref 15–41)
Albumin: 2.2 g/dL — ABNORMAL LOW (ref 3.5–5.0)
Alkaline Phosphatase: 93 U/L (ref 38–126)
Anion gap: 4 — ABNORMAL LOW (ref 5–15)
BUN: 13 mg/dL (ref 8–23)
CO2: 26 mmol/L (ref 22–32)
Calcium: 8.2 mg/dL — ABNORMAL LOW (ref 8.9–10.3)
Chloride: 99 mmol/L (ref 98–111)
Creatinine, Ser: 0.72 mg/dL (ref 0.44–1.00)
GFR, Estimated: >60 mL/min (ref >60)
Glucose, Bld: 96 mg/dL (ref 70–99)
Potassium: 4 mmol/L (ref 3.5–5.1)
Sodium: 129 mmol/L — ABNORMAL LOW (ref 135–145)
Total Bilirubin: 1.3 mg/dL — ABNORMAL HIGH (ref 0.3–1.2)
Total Protein: 5.6 g/dL — ABNORMAL LOW (ref 6.5–8.1)

## 2021-04-13 NOTE — Consult Note (Signed)
Consultation Note Date: 04/13/2021   Patient Name: Monica [name]  DOB: 1933/08/14  MRN: [id number]  Age / Sex: 87 y.o., female  PCP: Cephas Darby, FNP Referring Physician: Florencia Reasons, MD  Reason for Consultation: Establishing goals of care  HPI/Patient Profile: Monica [name] is a 87 y.o. female with medical history significant of dementia, coronary artery disease with reported EF of 30 to 35%,, essential hypertension, History of AAA status post endovascular repair, history of GI bleed, hyperlipidemia, morbid obesity, history of aortic stenosis, status post intramedullary nail and multiple cardiac cath brought in today due to altered mental status.  Upon arriving the hospital, patient had abnormal urine, suggesting UTI.  CT chest/abdomen/pelvis suggestive of mild colitis.  Upper quadrant ultrasound did not show any evidence of cholecystitis.  Patient met severe sepsis criteria with tachycardia, tachypnea, significant leukocytosis, elevated lactic acid of 3.0, profound mental status changes, elevated procalcitonin level  Clinical Assessment and Goals of Care: Patient is known to PMT as I worked with the family in depth last admission. At this time, patient is resting in bed. She is able to tell me her first name. She tells me her last name after being asked multiple times and with a period of time. She is able to tell me the year is 2022. Upon attempting to speak with her further she watches t.v. or gazes at me without answering. She did say "I'm about ready to stick it up" when asked how she felt and what she wanted. She could not tell me what that meant.   Called to speak with son. He states she after leaving the hospital, she began vomiting when she ate. He states as a result she was weak and was not able to work with PT as they would have hoped. Son tells me she has lost a lot of weight since last  hospitalization in June- and this is noticeable to me as well.   He states when she came home, the first few days she was doing well but then developed confusion. He has been well updated and continues to articulate her status well.   We discussed her diagnoses, prognosis, GOC, EOL wishes disposition and options.  Created space and opportunity for patient  to explore thoughts and feelings regarding current medical information. We discussed the events of last hospitalization including her  hip infection, AAA repair, severe aortic stenosis with recommendation for TAVR, MI and EF of 30-35%. We discussed this admission including infection and a CVA. We discussed things that are treatable such as infection as well as chronic illnesses, and how all of this affects your body.   A detailed discussion was had today regarding advanced directives.  Concepts specific to code status, artifical feeding and hydration, IV antibiotics and rehospitalization were discussed.  The difference between an aggressive medical intervention path and a comfort care path was discussed. Hospice at home was discussed.  Values and goals of care important to patient and family were attempted to be elicited.  Discussed  limitations of medical interventions to prolong quality of life in some situations and discussed the concept of human mortality. Discussed what would be an acceptable QOL to her.   He confirms DNR/DNI. He states she would not want a feeding tube and if she is not eating well enough to sustain, she would certainly be transitioned to hospice care.   He states he will speak with family and make decisions moving forward. Son tells me he will be taking her home and she will not be going to SNF. At this juncture, it is a question of continued palliative care, or if they will opt for hospice at home.   SUMMARY OF RECOMMENDATIONS   Son to speak with other family members about home with continued palliative services vs  initiation of hospice. She is hospice at home appropriate if this route is chosen. He will call staff to let them know his wishes.     Prognosis:  < 6 months     Primary Diagnoses: Present on Admission:  Severe sepsis ([place])  Abdominal aortic aneurysm (AAA) without rupture (HCC)  CAD (coronary artery disease)  HFrEF (heart failure with reduced ejection fraction) (HCC)  Hypokalemia  Hyponatremia  Acute metabolic encephalopathy   I have reviewed the medical record, interviewed the patient and family, and examined the patient. The following aspects are pertinent.  Past Medical History:  Diagnosis Date   Aortic stenosis    a. LHC 06/02/17: At least moderate aortic stenosis with a peak to peak gradient of 22 mmHg; b. TTE 05/2017: EF 55-60%, mild HK basal-midinferior wall, Gr1DD, mod to sev AS w/ mean gradient 21 mmHg, valve area 0.99, mild MR, mildly dilated LA    CAD (coronary artery disease) 2018   a. inferior STEMI 06/02/2017: LHC 06/02/17: LM 20, D1 20%, o-pLCx 34, p-mRCA 100% s/p PCI/DES, mRCA 50, dRCA 30   GI bleed    a. noted 06/05/2017   HLD (hyperlipidemia)    Obesity    Social History   Socioeconomic History   Marital status: Married    Spouse name: Not on file   Number of children: Not on file   Years of education: Not on file   Highest education level: Not on file  Occupational History   Occupation: retired  Tobacco Use   Smoking status: Former    Packs/day: 0.50    Years: 50.00    Pack years: 25.00    Types: Cigarettes    Quit date: 06/01/2017    Years since quitting: 3.8   Smokeless tobacco: Never  Vaping Use   Vaping Use: Never used  Substance and Sexual Activity   Alcohol use: No   Drug use: No   Sexual activity: Never    Partners: Female  Other Topics Concern   Not on file  Social History Narrative   Not on file   Social Determinants of Health   Financial Resource Strain: Not on file  Food Insecurity: Not on file  Transportation Needs:  Not on file  Physical Activity: Not on file  Stress: Not on file  Social Connections: Not on file   Family History  Problem Relation Age of Onset   Valvular heart disease Mother    Diabetes Mellitus II Daughter    Scheduled Meds:  aspirin EC  81 mg Oral Daily   chlorhexidine  15 mL Mouth Rinse BID   Chlorhexidine Gluconate Cloth  6 each Topical Daily   clopidogrel  75 mg Oral Daily   enoxaparin (LOVENOX)  injection  0.5 mg/kg Subcutaneous Q24H   feeding supplement  237 mL Oral BID BM   mouth rinse  15 mL Mouth Rinse q12n4p   metoprolol tartrate  12.5 mg Oral BID   QUEtiapine  25 mg Oral QHS   rosuvastatin  40 mg Oral Daily   senna-docusate  1 tablet Oral BID   Continuous Infusions: PRN Meds:.guaiFENesin, morphine injection, ondansetron **OR** ondansetron (ZOFRAN) IV, oxyCODONE-acetaminophen Medications Prior to Admission:  Prior to Admission medications   Medication Sig Start Date End Date Taking? Authorizing Provider  carvedilol (COREG) 3.125 MG tablet Take 1 tablet by mouth 2 (two) times daily with a meal. 07/24/20  Yes [provider]  aspirin 81 MG EC tablet Take 81 mg by mouth daily.    [provider]  clopidogrel (PLAVIX) 75 MG tablet Take 1 tablet (75 mg total) by mouth daily. 02/10/21   Fritzi Mandes, MD  furosemide (LASIX) 20 MG tablet Take 20 mg by mouth daily.    [provider]  guaiFENesin-dextromethorphan (ROBITUSSIN DM) 100-10 MG/5ML syrup Take 5 mLs by mouth every 4 (four) hours as needed for cough. 02/10/21   Fritzi Mandes, MD  metoprolol succinate (TOPROL-XL) 25 MG 24 hr tablet Take 1 tablet (25 mg total) by mouth daily. 02/10/21   Fritzi Mandes, MD  naloxegol oxalate (MOVANTIK) 25 MG TABS tablet Take by mouth daily.    [provider]  ondansetron (ZOFRAN-ODT) 4 MG disintegrating tablet Take 4 mg by mouth every 6 (six) hours as needed for nausea or vomiting.    [provider]  oxyCODONE (OXY IR/ROXICODONE) 5 MG immediate release  tablet Take 1-2 tablets (5-10 mg total) by mouth every 4 (four) hours as needed for moderate pain (pain score 4-6). Patient not taking: No sig reported 01/19/21   Lattie Corns, PA-C  oxyCODONE (OXY IR/ROXICODONE) 5 MG immediate release tablet Take 1 tablet (5 mg total) by mouth every 6 (six) hours as needed for severe pain. Patient not taking: No sig reported 02/10/21   Fritzi Mandes, MD  pantoprazole (PROTONIX) 40 MG tablet Take 1 tablet (40 mg total) by mouth daily. 02/10/21   Fritzi Mandes, MD  ranolazine (RANEXA) 500 MG 12 hr tablet Take 1 tablet (500 mg total) by mouth 2 (two) times daily. 02/10/21   Fritzi Mandes, MD  rosuvastatin (CRESTOR) 10 MG tablet Take 4 tablets (40 mg total) by mouth daily. 02/10/21   Fritzi Mandes, MD  senna (SENOKOT) 8.6 MG TABS tablet Take 2 tablets by mouth 2 (two) times daily.    [provider]  Sodium Phosphates (RA SALINE ENEMA RE) Place 1 application rectally daily as needed. constipation    [provider]  vitamin C (ASCORBIC ACID) 500 MG tablet Take 500 mg by mouth daily.    [provider]   No Known Allergies Review of Systems  Unable to perform ROS  Physical Exam Pulmonary:     Effort: Pulmonary effort is normal.  Neurological:     Mental Status: She is alert.    Vital Signs: BP (!) 142/78 (BP Location: Right Wrist)   Pulse 88   Temp 97.6 F (36.4 C) (Axillary)   Resp 20   Ht 5' 5"  (1.651 m)   Wt 88.1 kg   SpO2 92%   BMI 32.32 kg/m  Pain Scale: 0-10   Pain Score: 0-No pain   SpO2: SpO2: 92 % O2 Device:SpO2: 92 % O2 Flow Rate: .O2 Flow Rate (L/min): 2 L/min  IO: Intake/output  summary:  Intake/Output Summary (Last 24 hours) at 04/13/2021 1531 Last data filed at 04/12/2021 1900 Gross per 24 hour  Intake --  Output 900 ml  Net -900 ml    LBM: Last BM Date: 04/08/21 Baseline Weight: Weight: 93.7 kg Most recent weight: Weight: 88.1 kg       Time In: 3:10 Time Out: 4:00 Time Total: 50 min Greater than 50%  of  this time was spent counseling and coordinating care related to the above assessment and plan.  Signed by: Asencion Gowda, NP   Please contact Palliative Medicine Team phone at [phone] for questions and concerns.  For individual provider: See Amion             Did rehab and was discharged. Came to hospital not talking. Lost a lot of weight since last admission. Vomiting at rehab.  UTI/ strokes. Name with a lot of time. Eating some. Drinking water.

## 2021-04-13 NOTE — Progress Notes (Signed)
Calorie Count Note  48 hour calorie count ordered. Discussed weight loss and increased fat/muscle depletions with MD 9/1.   Great documentation by nursing staff for meals 9/1. Based on 9/1 intake, pt is not meeting protein or overall kcal needs. Will monitor intake today to see if trends are consistent.   Diet: DYS 2, thin liquids Supplements: Ensure Enlive TID  Breakfast 9/1: 423 kcal, 11g of protein Lunch 9/1: 0% intake of meal Dinner 9/1: 402 kcal, 14g of protein Supplements: 1 ensure enlive (350kcal, 20g of protein)  Estimated Nutritional Needs:  Kcal:  1700-1900 kcal/d Protein:  85-95 g/d Fluid:  1.6-1.8 L/d  Total intake: 1175 kcal (69% of minimum estimated needs)  45 protein (53% of minimum estimated needs)  Nutrition Dx: Severe Malnutrition (in the context of acute illness) related to poor appetite (recurrent hospitalizations) as evidenced by percent weight loss, moderate muscle depletion (6.5% x 1 month).  Goal: Patient will meet greater than or equal to 90% of their needs  Intervention:  Continue current diet as tolerated per pt Ensure Enlive po BID, each supplement provides 350 kcal and 20 grams of protein   Greig Castilla, RD, LDN Clinical Dietitian Pager on Amion

## 2021-04-13 NOTE — Progress Notes (Signed)
PROGRESS NOTE    Monica [name]  VWU:[id number] DOB: 11-04-1933 DOA: 04/04/2021 PCP: Wardell Honourastillo, Faith L, FNP    Chief Complaint  Patient presents with   Altered Mental Status   Fall    Brief Narrative:  Monica [name] is a 87 y.o. female with medical history significant of dementia, CAD s/p stent placement, CHF EF of 30 to 35%, h/o aortic stenosis followed by cardiology Dr Samuella Cotaaridia, History of AAA status post endovascular repair, history of GI bleed, hyperlipidemia,  Presented with severe sepsis, acute metabolic encephalopathy, possible colitis/UTI Found to have acute CVA Need SNF placement with palliative care following  Subjective:  Speech is delayed, she know the year and place She is on 2liter oxygen at rest, denies sob She states she does not like the lunch but she will drink the ensure Calorie count started to monitor oral intake, palliative consulted for goals of care discussion  Assessment & Plan:   Principal Problem:   Severe sepsis (HCC) Active Problems:   CAD (coronary artery disease)   Severe aortic stenosis   Abdominal aortic aneurysm (AAA) without rupture (HCC)   HFrEF (heart failure with reduced ejection fraction) (HCC)   Hypokalemia   Hyponatremia   Acute metabolic encephalopathy   Pressure injury of skin   Protein-calorie malnutrition, severe   Acute lower UTI   Acute ischemic left PCA stroke (HCC)   Essential hypertension  Sever sepsis present on admission with UTI/colitis Finished antibiotic treatment, sepsis resolved Restarted lopressor,  prn lasix  Acute metabolic encephalopathy from sepsis also found to have acute CVA MRI showed left parietal infarct, likely embolic -EEG negative for seizure activities -Seen by neurology, echocardiogram did not show cardio embolic source, neurology recommend prolonged cardiac monitoring outpatient - continue asa (only needs monotherpay from stroke prevention standpoint per neurology), statin, cardiology to  arrange zio patch at discharge, ( secure chat with Dr Kirke CorinArida and assistance)  AKI on CKD 2, hypokalemia, hypophosphatemia O improved and normalized  Hyponatremia Likely from poor oral intake, son plan to get her home with home hospice Encourage oral intake  Hypomagnesemia Replace mag  History of CAD status post stent placement, chronic systolic CHF Denies chest pain, appear euvolemic  continue aspirin, Plavix, statin ,resume Lopressor, resume  prn Lasix  ? ILD? Cxr on presentation showed "Chronic interstitial thickening, suspicious for interstitial lung disease. No superimposed acute process" Currently no hypoxia, no cough observed today Currently she is on 2liter o2  Vascular dementia/FTT Slightly confused, but calm and cooperative Very weak, not able to lift leg above gravity   The patient's BMI is: Body mass index is 32.32 kg/m.Marland Kitchen.  Seen by dietician.  I agree with the assessment and plan as outlined below:  Nutrition Status: Nutrition Problem: Severe Malnutrition (in the context of acute illness) Etiology: poor appetite (recurrent hospitalizations) Signs/Symptoms: percent weight loss, moderate muscle depletion (6.5% x 1 month) Percent weight loss: 6.5 % Interventions: Refer to RD note for recommendations  .     Skin Assessment:  I have examined the patient's skin and I agree with the wound assessment as performed by the wound care RN as outlined below:  Pressure Injury 04/05/21 Vertebral column Lower;Medial Stage 2 -  Partial thickness loss of dermis presenting as a shallow open injury with a red, pink wound bed without slough. shallow pink wound bed (Active)  04/05/21 1515  Location: Vertebral column  Location Orientation: Lower;Medial  Staging: Stage 2 -  Partial thickness loss of dermis presenting as a shallow open injury  with a red, pink wound bed without slough.  Wound Description (Comments): shallow pink wound bed  Present on Admission: Yes    Unresulted  Labs (From admission, onward)     Start     Ordered   04/14/21 0500  Basic metabolic panel  Tomorrow morning,   R       Question:  Specimen collection method  Answer:  Lab=Lab collect   04/13/21 1552   04/14/21 0500  Magnesium  Tomorrow morning,   R       Question:  Specimen collection method  Answer:  Lab=Lab collect   04/13/21 1552              DVT prophylaxis: lovenox   Code Status:DNR Family Communication: son over the phone Disposition:   Status is: Inpatient  Dispo: The patient is from: Home              Anticipated d/c is to: home with home hospice              Anticipated d/c date is: 9/3                Consultants:  Neurology Palliative care hospice  Procedures:  None  Antimicrobials:   Anti-infectives (From admission, onward)    Start     Dose/Rate Route Frequency Ordered Stop   04/08/21 1000  cefTRIAXone (ROCEPHIN) 1 g in sodium chloride 0.9 % 100 mL IVPB  Status:  Discontinued        1 g 200 mL/hr over 30 Minutes Intravenous Every 24 hours 04/08/21 0845 04/08/21 1302   04/05/21 0045  ceFEPIme (MAXIPIME) 2 g in sodium chloride 0.9 % 100 mL IVPB  Status:  Discontinued        2 g 200 mL/hr over 30 Minutes Intravenous  Once 04/05/21 0044 04/05/21 0050   04/05/21 0000  ceFEPIme (MAXIPIME) 2 g in sodium chloride 0.9 % 100 mL IVPB  Status:  Discontinued        2 g 200 mL/hr over 30 Minutes Intravenous Every 12 hours 04/04/21 2223 04/08/21 0845   04/04/21 2130  vancomycin (VANCOREADY) IVPB 2000 mg/400 mL        2,000 mg 200 mL/hr over 120 Minutes Intravenous  Once 04/04/21 2104 04/05/21 0037   04/04/21 1845  cefTRIAXone (ROCEPHIN) 1 g in sodium chloride 0.9 % 100 mL IVPB        1 g 200 mL/hr over 30 Minutes Intravenous  Once 04/04/21 1837 04/04/21 1942           Objective: Vitals:   04/13/21 0636 04/13/21 0900 04/13/21 1000 04/13/21 1144  BP: (!) 148/89 135/71  (!) 142/78  Pulse: 95 95  88  Resp: Temp: 98.2 F (36.8 C) 98.2 F  (36.8 C)  97.6 F (36.4 C)  TempSrc: Oral Oral  Axillary  SpO2: 94%   92%  Weight:   88.1 kg   Height:        Intake/Output Summary (Last 24 hours) at 04/13/2021 1554 Last data filed at 04/12/2021 1900 Gross per 24 hour  Intake --  Output 900 ml  Net -900 ml   Filed Weights   04/04/21 1245 04/05/21 1506 04/13/21 1000  Weight: 93.7 kg 87.6 kg 88.1 kg    Examination:  General exam: Chronically ill appearing, calm, NAD,  confused, delayed speech Respiratory system: Diminished at bases, no wheezing, no rales, no rhonchi, respiratory effort normal. Cardiovascular system: S1 & S2 heard, RRR. +  precordial murmur Gastrointestinal system: Abdomen is nondistended, soft and nontender.  Normal bowel sounds heard. Central nervous system: Alert and oriented to self, know she is in the hospital, not able to name it, she is confused about the time Extremities: no edema, not able to lift legs against gravity Skin: No rashes, lesions or ulcers Psychiatry: Confused, but calm and cooperative    Data Reviewed: I have personally reviewed following labs and imaging studies  CBC: Recent Labs  Lab 04/08/21 0442  WBC 8.5  NEUTROABS 5.8  HGB 11.8*  HCT 36.0  MCV 83.7  PLT 211    Basic Metabolic Panel: Recent Labs  Lab 04/07/21 0437 04/08/21 0442 04/10/21 0456 04/11/21 0526 04/12/21 0449 04/13/21 0419  NA 140 142 140 135 133* 129*  K 3.4* 3.7 4.0 4.0 4.5 4.0  CL 112* 112* 111 107 102 99  CO2 25 25 24 23 26 26   GLUCOSE 98 86 85 113* 79 96  BUN 17 15 12 13 13 13   CREATININE 0.81 0.76 0.71 0.72 0.73 0.72  CALCIUM 7.9* 8.0* 8.2* 7.9* 8.2* 8.2*  MG 2.0 1.9 1.9 1.8 1.7  --   PHOS 1.7* 2.6 2.0* 2.9  --   --     GFR: Estimated Creatinine Clearance: 56.3 mL/min (by C-G formula based on SCr of 0.72 mg/dL).  Liver Function Tests: Recent Labs  Lab 04/13/21 0419  AST 31  ALT 19  ALKPHOS 93  BILITOT 1.3*  PROT 5.6*  ALBUMIN 2.2*     CBG: Recent Labs  Lab 04/06/21 2059   GLUCAP 98     Recent Results (from the past 240 hour(s))  Resp Panel by RT-PCR (Flu A&B, Covid) Nasopharyngeal Swab     Status: None   Collection Time: 04/04/21  4:36 PM   Specimen: Nasopharyngeal Swab; Nasopharyngeal(NP) swabs in vial transport medium  Result Value Ref Range Status   SARS Coronavirus 2 by RT PCR NEGATIVE NEGATIVE Final    Comment: (NOTE) SARS-CoV-2 target nucleic acids are NOT DETECTED.  The SARS-CoV-2 RNA is generally detectable in upper respiratory specimens during the acute phase of infection. The lowest concentration of SARS-CoV-2 viral copies this assay can detect is 138 copies/mL. A negative result does not preclude SARS-Cov-2 infection and should not be used as the sole basis for treatment or other patient management decisions. A negative result may occur with  improper specimen collection/handling, submission of specimen other than nasopharyngeal swab, presence of viral mutation(s) within the areas targeted by this assay, and inadequate number of viral copies(<138 copies/mL). A negative result must be combined with clinical observations, patient history, and epidemiological information. The expected result is Negative.  Fact Sheet for Patients:  2060  Fact Sheet for Healthcare Providers:  04/06/21  This test is no t yet approved or cleared by the [employer] FDA and  has been authorized for detection and/or diagnosis of SARS-CoV-2 by FDA under an Emergency Use Authorization (EUA). This EUA will remain  in effect (meaning this test can be used) for the duration of the COVID-19 declaration under Section 564(b)(1) of the Act, 21 U.S.C.section 360bbb-3(b)(1), unless the authorization is terminated  or revoked sooner.       Influenza A by PCR NEGATIVE NEGATIVE Final   Influenza B by PCR NEGATIVE NEGATIVE Final    Comment: (NOTE) The Xpert Xpress SARS-CoV-2/FLU/RSV plus assay is  intended as an aid in the diagnosis of influenza from Nasopharyngeal swab specimens and should not be used as a sole basis for  treatment. Nasal washings and aspirates are unacceptable for Xpert Xpress SARS-CoV-2/FLU/RSV testing.  Fact Sheet for Patients: [employer]gerCourse.com  Fact Sheet for Healthcare Providers: SeriousBroker.it  This test is not yet approved or cleared by the [place] FDA and has been authorized for detection and/or diagnosis of SARS-CoV-2 by FDA under an Emergency Use Authorization (EUA). This EUA will remain in effect (meaning this test can be used) for the duration of the COVID-19 declaration under Section 564(b)(1) of the Act, 21 U.S.C. section 360bbb-3(b)(1), unless the authorization is terminated or revoked.  Performed at [facility], [address], [place], [place] [address]   Urine Culture     Status: None   Collection Time: 04/04/21  4:36 PM   Specimen: Urine, Random  Result Value Ref Range Status   Specimen Description   Final    URINE, RANDOM Performed at [facility], [address], [place], [place] [address]    Special Requests   Final    NONE Performed at [facility], [address] [place], [place] [address]    Culture   Final    NO GROWTH Performed at [facility], [address] [place], [place] [address]    Report Status 04/06/2021 FINAL  Final  Blood culture (routine x 2)     Status: None   Collection Time: 04/04/21  5:36 PM   Specimen: BLOOD  Result Value Ref Range Status   Specimen Description BLOOD BLOOD LEFT HAND  Final   Special Requests   Final    BOTTLES DRAWN AEROBIC ONLY Blood Culture adequate volume   Culture   Final    NO GROWTH 5 DAYS Performed at [facility], [address] [place], [place] [address]    Report Status 04/09/2021 FINAL  Final  Blood culture (routine x 2)     Status: None   Collection Time:  04/04/21  5:38 PM   Specimen: BLOOD  Result Value Ref Range Status   Specimen Description BLOOD RIGHT ANTECUBITAL  Final   Special Requests   Final    BOTTLES DRAWN AEROBIC AND ANAEROBIC Blood Culture results may not be optimal due to an inadequate volume of blood received in culture bottles   Culture   Final    NO GROWTH 5 DAYS Performed at [facility], [address] [place], [place] [address]    Report Status 04/09/2021 FINAL  Final  MRSA Next Gen by PCR, Nasal     Status: None   Collection Time: 04/06/21  9:25 AM   Specimen: Nasal Mucosa; Nasal Swab  Result Value Ref Range Status   MRSA by PCR Next Gen NOT DETECTED NOT DETECTED Final    Comment: (NOTE) The GeneXpert MRSA Assay (FDA approved for NASAL specimens only), is one component of a comprehensive MRSA colonization surveillance program. It is not intended to diagnose MRSA infection nor to guide or monitor treatment for MRSA infections. Test performance is not FDA approved in patients less than 58 years old. Performed at [facility], [address] [place], [place] [address]          Radiology Studies: No results found.      Scheduled Meds:  aspirin EC  81 mg Oral Daily   chlorhexidine  15 mL Mouth Rinse BID   Chlorhexidine Gluconate Cloth  6 each Topical Daily   clopidogrel  75 mg Oral Daily   enoxaparin (LOVENOX) injection  0.5 mg/kg Subcutaneous Q24H   feeding supplement  237 mL Oral BID BM   mouth rinse  15 mL Mouth Rinse q12n4p   metoprolol tartrate  12.5 mg Oral BID   QUEtiapine  25 mg Oral QHS   rosuvastatin  40 mg Oral Daily   senna-docusate  1 tablet Oral BID   Continuous Infusions:   LOS: 9 days   Time spent: Greater than 50% of this time was spent in counseling, explanation of diagnosis, planning of further management, and coordination of care.   Voice Recognition Reubin Milan dictation system was used to create this note, attempts have been made to correct  errors. Please contact the author with questions and/or clarifications.   Albertine Grates, MD PhD FACP [facility]  Available via Epic secure chat 7am-7pm for nonurgent issues Please page for urgent issues To page the attending provider between 7A-7P or the covering provider during after hours 7P-7A, please log into the web site [email or web address] and access using universal [place] password for that web site. If you do not have the password, please call the hospital operator.    04/13/2021, 3:54 PM

## 2021-04-13 NOTE — Progress Notes (Signed)
Physical Therapy Treatment Patient Details Name: Monica [name] MRN: [id number] DOB: 1934/01/03 Today's Date: 04/13/2021    History of Present Illness 87 y.o. female presented to ED with AMS. Pt dx with severe sepsis. Imaging from CT reveals multiple subacute infarcts - cortical and subcortical hypodensities in left greater than right occipital lobes with possible basal ganglia and cerebellar involvement. Medical history significant of dementia, coronary artery disease with reported EF of 30 to 35%, essential hypertension, history of AAA status post endovascular repair, history of GI bleed, hyperlipidemia, morbid obesity, history of aortic stenosis, s/p intramedullary nail and multiple cardiac cath.    PT Comments    Pt was long sitting in bed upon arriving. She is alert but disoriented. Only oriented to self. Pt gave [name] permission to call her daughter to discuss PLOF. In chart, note to be bed bound however per daughter, pt was getting to/from [facility] with assistance. During session, pt was able to roll L to short sit with increased time and max assist. She sat EOB x ~ 10 minutes with only min assist at times. Tolerated several exercises EOB prior to refusing standing attempts. Pt will benefit from continued skilled PT at DC to address deficits while maximizing independence with ADLs. Rehab most appropriate however currently son is wanting pt to DC straight home to daughters residence.    Follow Up Recommendations  SNF;Supervision/Assistance - 24 hour;Other (comment) (currently son refusing rehab at DC)     Equipment Recommendations  None recommended by PT       Precautions / Restrictions Precautions Precautions: Fall Restrictions Weight Bearing Restrictions: No    Mobility  Bed Mobility Overal bed mobility: Needs Assistance Bed Mobility: Rolling;Supine to Sit;Sit to Supine Rolling: Max assist   Supine to sit: Max assist Sit to supine: Max assist   General bed mobility comments: Pt  was able to roll L to short sit with increased time and max assist. sat EOB x ~ 10 minutes while performing ther ex at EOB.    Transfers    General transfer comment: Unsafe to trial. Recommend hoyer lift for any all OOB activity      Balance Overall balance assessment: Needs assistance Sitting-balance support: Bilateral upper extremity supported;Feet supported Sitting balance-Leahy Scale: Fair Sitting balance - Comments: pt required CGA with occasional min assist to prevent LOB        Cognition Arousal/Alertness: Awake/alert Behavior During Therapy: Flat affect Overall Cognitive Status: History of cognitive impairments - at baseline      General Comments: Pt is A but only oriented to self. Asked several times where she was during session. Poor insight to situation or abilities.             Pertinent Vitals/Pain Pain Assessment: No/denies pain Faces Pain Scale: Hurts a little bit Pain Location: back, knees Pain Descriptors / Indicators: Grimacing Pain Intervention(s): Limited activity within patient's tolerance;Premedicated before session;Monitored during session;Repositioned     PT Goals (current goals can now be found in the care plan section) Acute Rehab PT Goals Patient Stated Goal: none stated Progress towards PT goals: Progressing toward goals    Frequency    Min 2X/week      PT Plan Current plan remains appropriate       AM-PAC PT "6 Clicks" Mobility   Outcome Measure  Help needed turning from your back to your side while in a flat bed without using bedrails?: A Lot Help needed moving from lying on your back to sitting on the side of  a flat bed without using bedrails?: A Lot Help needed moving to and from a bed to a chair (including a wheelchair)?: Total Help needed standing up from a chair using your arms (e.g., wheelchair or bedside chair)?: Total Help needed to walk in hospital room?: Total Help needed climbing 3-5 steps with a railing? : Total 6  Click Score: 8    End of Session   Activity Tolerance: Patient tolerated treatment well;Patient limited by fatigue Patient left: in bed;with call bell/phone within reach;with bed alarm set;with family/visitor present Nurse Communication: Mobility status;Other (comment) PT Visit Diagnosis: History of falling (Z91.81);Muscle weakness (generalized) (M62.81);Hemiplegia and hemiparesis Hemiplegia - Right/Left: Right Hemiplegia - dominant/non-dominant: Dominant Hemiplegia - caused by: Cerebral infarction     Time: 1287-8676 PT Time Calculation (min) (ACUTE ONLY): 30 min  Charges:  $Therapeutic Exercise: 8-22 mins $Therapeutic Activity: 8-22 mins                     Jetta Lout PTA 04/13/21, 2:23 PM

## 2021-04-13 NOTE — TOC Progression Note (Signed)
Transition of Care [facility]) - Progression Note    Patient Details  Name: Monica [name] MRN: [id number] Date of Birth: [birth year]  Transition of Care [facility]) CM/SW Contact  Allayne Butcher, RN Phone Number: 04/13/2021, 4:21 PM  Clinical Narrative:    Palliative was able to speak with patient's son Monica [name] about goals of care.  Family has decided that they would like to take the patient home with hospice services through [facility].  Monica [name] given hospice referral.  Patient will need oxygen at home, over the bed table, wheelchair and hoyer lift.  Plan for discharge tomorrow and patient will need EMS transport.    Expected Discharge Plan: Home w Hospice Care Barriers to Discharge: Continued Medical Work up  Expected Discharge Plan and Services Expected Discharge Plan: Home w Hospice Care   Discharge Planning Services: CM Consult Post Acute Care Choice: Hospice Living arrangements for the past 2 months: Single Family Home                 DME Arranged: Wheelchair manual, Oxygen, Other see comment, Overbed table (hoyer lift) DME Agency: [facility] Date DME Agency Contacted: 04/13/21 Time DME Agency Contacted: 1620 Representative spoke with at DME Agency: Monica [name] with [facility] HH Arranged: NA HH Agency: NA Date HH Agency Contacted: 04/13/21 Time HH Agency Contacted: 1621 Representative spoke with at [facility] Agency: Hospice at home with [facility] - Monica [name]   Social Determinants of Health (SDOH) Interventions    Readmission Risk Interventions Readmission Risk Prevention Plan 04/07/2021  Transportation Screening Complete  PCP or Specialist Appt within 3-5 Days Complete  HRI or Home Care Consult Complete  Social Work Consult for Recovery Care Planning/Counseling Complete  Palliative Care Screening Not Applicable  Medication Review Oceanographer) Complete  Some recent data might be hidden

## 2021-04-13 NOTE — Therapy (Signed)
Zio packet left in the patient's room.  This monitor is for outpatient use.  The discharge has not been confirmed at this time.  If the patient goes home, the family may apply.  Any questions related to the application or use of the zio should be directed to the [employer].  The telephone number is on the box.

## 2021-04-13 NOTE — Progress Notes (Addendum)
[facility]) Hospital Liaison Note  Received request from Transitions of Care Manager Robbie Lis, RN, for hospice services at home after discharge. Chart and patient information reviewed by [facility] physician. Hospice eligibility confirmed.  Spoke with patient's son, Krishauna Schatzman over the phone to initiate education related to hospice philosophy, services and team approach to care. Patient/family verbalized understanding of information provided. Per discussion, the plan for discharge is on 9.3.22 via EMS.  DME needs discussed. Patient has the following equipment in the home: hospital bed, BSC and walker. Patient/family requests the following equipment for delivery: hoyer lift, w/c, OBT and oxygen. Address has been verified and is [address], [place], [place] [address]. Arda Daggs is the family contact to arrange time of equipment delivery.   Please send signed and completed DNR home with patient/family. Please provide prescriptions at discharge as needed to ensure ongoing symptom management.   ACC information and contact numbers given to [employer]. Above information shared with Robbie Lis, RN [facility] Manager.   Please do not hesitate to call with any hospice related questions or concerns.   Thank you for the opportunity to participate in this patient's care.   Bobbie "Einar Gip, RN, BSN [facility] Liaison [phone]

## 2021-04-14 LAB — BASIC METABOLIC PANEL
Anion gap: 4 — ABNORMAL LOW (ref 5–15)
BUN: 12 mg/dL (ref 8–23)
CO2: 27 mmol/L (ref 22–32)
Calcium: 8.2 mg/dL — ABNORMAL LOW (ref 8.9–10.3)
Chloride: 103 mmol/L (ref 98–111)
Creatinine, Ser: 0.71 mg/dL (ref 0.44–1.00)
GFR, Estimated: >60 mL/min (ref >60)
Glucose, Bld: 87 mg/dL (ref 70–99)
Potassium: 3.6 mmol/L (ref 3.5–5.1)
Sodium: 134 mmol/L — ABNORMAL LOW (ref 135–145)

## 2021-04-14 LAB — MAGNESIUM: Magnesium: 1.9 mg/dL (ref 1.7–2.4)

## 2021-04-14 MED ORDER — FUROSEMIDE 20 MG PO TABS: 20.0000 mg | ORAL_TABLET | ORAL | Status: AC

## 2021-04-14 MED ORDER — OXYCODONE-ACETAMINOPHEN 5-325 MG PO TABS: 1.0000 | ORAL_TABLET | Freq: Two times a day (BID) | ORAL | 0 refills | Status: AC | PRN

## 2021-04-14 MED ORDER — ENSURE ENLIVE PO LIQD: 237.0000 mL | Freq: Two times a day (BID) | ORAL | 12 refills | Status: AC

## 2021-04-14 NOTE — Progress Notes (Signed)
Patient discharged from facility via EMS. Patient's son Monica [name] notified patient is in route home. No questions at this time.

## 2021-04-14 NOTE — Discharge Summary (Signed)
Discharge Summary  Monica [name] QVZ:[id number] DOB: [birth year]  PCP: Wardell Honour, FNP  Admit date: 04/04/2021 Discharge date: 04/14/2021  Time spent: , more than 50% time spent on coordination of care.   Recommendations for Outpatient Follow-up:  Home with home hospice     Discharge Diagnoses:  Active Hospital Problems   Diagnosis Date Noted   Severe sepsis (HCC) 04/04/2021   Essential hypertension 04/10/2021   Acute ischemic left PCA stroke (HCC) 04/08/2021   Acute lower UTI 04/07/2021   Pressure injury of skin 04/06/2021   Protein-calorie malnutrition, severe 04/06/2021   Hypokalemia 04/04/2021   Hyponatremia 04/04/2021   Acute metabolic encephalopathy 04/04/2021   HFrEF (heart failure with reduced ejection fraction) (HCC)    Abdominal aortic aneurysm (AAA) without rupture (HCC)    Severe aortic stenosis    CAD (coronary artery disease) 2018    Resolved Hospital Problems  No resolved problems to display.    Discharge Condition: stable  Diet recommendation: dysphagia diet   Filed Weights   04/05/21 1506 04/13/21 1000 04/14/21 0500  Weight: 87.6 kg 88.1 kg 88.7 kg    History of present illness: ( per admitting MD Dr Mikeal Hawthorne) Patient coming from: Home   Chief Complaint: Altered mental status   HPI: Monica [name] is a 87 y.o. female with medical history significant of dementia, coronary artery disease with reported EF of 30 to 35%,, essential hypertension, History of AAA status post endovascular repair, history of GI bleed, hyperlipidemia, morbid obesity, history of aortic stenosis, status post intramedullary nail and multiple cardiac cath who also follows up with Dr. Kirke Corin for her cardiology and aggressive [facility] brought in today due to altered mental status.  Patient usually is awake and communicating although bedbound.  She is completely obtunded at the moment.  Not arousable only moaning.  There was concern about possible UTI.  Also patient was  reportedly found on the floor having fallen out of bed..  She appears to be tender in the right upper quadrant on initial evaluation suggestive of possible gallbladder disease.  Patient's son [name] is primary historian.  At this point patient is suspected to have severe sepsis with hypoxia, possible cholelithiasis and UTI..   ED Course: Temperature 98.5 blood pressure 142/106, pulse 115 respiratory 25 oxygen sat 89% on room air.  White count is 9.0 hemoglobin 15.7 platelets 243.  Sodium is 130 potassium 2.5 chloride 89 CO2 28 BUN 11 creatinine 1.1 and calcium 8.5.  Initial troponin is 59.  Lactic acid 3.0.  COVID and influenza screen negative.  Urinalysis showed amber urine.  Large leukocytes.  WBC more than 50.  Some bacteria.  CT chest and abdomen is suggestive of mild colitis.  Abdominal ultrasound showed cholelithiasis without complicating factors.  Chest x-ray showed chronic interstitial thickening possible interstitial lung disease.  Patient therefore being admitted to the hospital for further evaluation and treatment.  Hospital Course:  Principal Problem:   Severe sepsis (HCC) Active Problems:   CAD (coronary artery disease)   Severe aortic stenosis   Abdominal aortic aneurysm (AAA) without rupture (HCC)   HFrEF (heart failure with reduced ejection fraction) (HCC)   Hypokalemia   Hyponatremia   Acute metabolic encephalopathy   Pressure injury of skin   Protein-calorie malnutrition, severe   Acute lower UTI   Acute ischemic left PCA stroke (HCC)   Essential hypertension  Sever sepsis present on admission with UTI/colitis Finished antibiotic treatment, sepsis resolved    Acute metabolic encephalopathy from sepsis also found  to have acute CVA MRI showed left parietal infarct, likely embolic -EEG negative for seizure activities -Seen by neurology, echocardiogram did not show cardio embolic source, neurology recommend prolonged cardiac monitoring outpatient - continue asa (only needs  monotherpay from stroke prevention standpoint per neurology), statin, cardiology to arrange zio patch at discharge, family decided to take patient home with home hospice   AKI on CKD 2, hypokalemia, hypophosphatemia O improved and normalized   Hyponatremia Likely from poor oral intake, son plan to get her home with home hospice Encourage oral intake   Hypomagnesemia Replace mag   History of CAD status post stent placement, chronic systolic CHF Denies chest pain, appear euvolemic  continue aspirin, Plavix, statin ,coreg   Decrease lasix to every other day, decrease to prn if oral intake does not improve   ? ILD? Cxr on presentation showed "Chronic interstitial thickening, suspicious for interstitial lung disease. No superimposed acute process" Currently no hypoxia, no cough observed today Currently she is on 2liter o2 family decided to take patient home with home hospice, home o2 arranged      Vascular dementia/FTT Slightly confused, but calm and cooperative Very weak, not able to lift leg above gravity family decided to take patient home with home hospice Wound     The patient's BMI is: Body mass index is 32.32 kg/m.Marland Kitchen   Seen by dietician.  I agree with the assessment and plan as outlined below:   Nutrition Status: Nutrition Problem: Severe Malnutrition (in the context of acute illness) Etiology: poor appetite (recurrent hospitalizations) Signs/Symptoms: percent weight loss, moderate muscle depletion (6.5% x 1 month) Percent weight loss: 6.5 % Interventions: Refer to RD note for recommendations   . Really long time no see  Skin Assessment:   I have examined the patient's skin and I agree with the wound assessment as performed by the wound care RN as outlined below:   Pressure Injury 04/05/21 Vertebral column Lower;Medial Stage 2 -  Partial thickness loss of dermis presenting as a shallow open injury with a red, pink wound bed without slough. shallow pink wound bed  (Active)  04/05/21 1515  Location: Vertebral column  Location Orientation: Lower;Medial  Staging: Stage 2 -  Partial thickness loss of dermis presenting as a shallow open injury with a red, pink wound bed without slough.  Wound Description (Comments): shallow pink wound bed  Present on Admission: Yes   Consultants:  Neurology Palliative care hospice   Procedures:  None   Discharge Exam: BP 122/75 (BP Location: Right Arm)   Pulse 95   Temp 97.8 F (36.6 C) (Oral)   Resp 16   Ht 5\' 5"  (1.651 m)   Wt 88.7 kg   SpO2 93%   BMI 32.54 kg/m   General exam: Chronically ill appearing, calm, NAD,  confused, delayed speech Respiratory system: Diminished at bases, no wheezing, no rales, no rhonchi, respiratory effort normal. Cardiovascular system: S1 & S2 heard, RRR. +precordial murmur Gastrointestinal system: Abdomen is nondistended, soft and nontender.  Normal bowel sounds heard. Central nervous system: Alert and oriented to self, know she is in the hospital, not able to name it, she is confused about the time Extremities: no edema, not able to lift legs against gravity Skin: No rashes, lesions or ulcers Psychiatry: Confused, but calm and cooperative   Discharge Instructions You were cared for by a hospitalist during your hospital stay. If you have any questions about your discharge medications or the care you received while you were in the  hospital after you are discharged, you can call the unit and asked to speak with the hospitalist on call if the hospitalist that took care of you is not available. Once you are discharged, your primary care physician will handle any further medical issues. Please note that NO REFILLS for any discharge medications will be authorized once you are discharged, as it is imperative that you return to your primary care physician (or establish a relationship with a primary care physician if you do not have one) for your aftercare needs so that they can  reassess your need for medications and monitor your lab values.  Discharge Instructions     Diet general   Complete by: As directed    Dysphagia II diet, thin liquid   Discharge wound care:   Complete by: As directed    Pressure off loading   Increase activity slowly   Complete by: As directed       Allergies as of 04/14/2021   No Known Allergies      Medication List     STOP taking these medications    metoprolol succinate 25 MG 24 hr tablet Commonly known as: TOPROL-XL   oxyCODONE 5 MG immediate release tablet Commonly known as: Oxy IR/ROXICODONE   ranolazine 500 MG 12 hr tablet Commonly known as: RANEXA       TAKE these medications    aspirin 81 MG EC tablet Take 81 mg by mouth daily.   carvedilol 3.125 MG tablet Commonly known as: COREG Take 1 tablet by mouth 2 (two) times daily with a meal.   clopidogrel 75 MG tablet Commonly known as: PLAVIX Take 1 tablet (75 mg total) by mouth daily.   feeding supplement Liqd Take 237 mLs by mouth 2 (two) times daily between meals.   furosemide 20 MG tablet Commonly known as: LASIX Take 1 tablet (20 mg total) by mouth every Monday, Wednesday, and Friday. Start taking on: April 16, 2021 What changed: when to take this   guaiFENesin-dextromethorphan 100-10 MG/5ML syrup Commonly known as: ROBITUSSIN DM Take 5 mLs by mouth every 4 (four) hours as needed for cough.   naloxegol oxalate 25 MG Tabs tablet Commonly known as: MOVANTIK Take by mouth daily.   ondansetron 4 MG disintegrating tablet Commonly known as: ZOFRAN-ODT Take 4 mg by mouth every 6 (six) hours as needed for nausea or vomiting.   oxyCODONE-acetaminophen 5-325 MG tablet Commonly known as: PERCOCET/ROXICET Take 1 tablet by mouth every 12 (twelve) hours as needed for up to 5 days for severe pain.   pantoprazole 40 MG tablet Commonly known as: PROTONIX Take 1 tablet (40 mg total) by mouth daily.   RA SALINE ENEMA RE Place 1 application  rectally daily as needed. constipation   rosuvastatin 10 MG tablet Commonly known as: CRESTOR Take 4 tablets (40 mg total) by mouth daily.   senna 8.6 MG Tabs tablet Commonly known as: SENOKOT Take 2 tablets by mouth 2 (two) times daily.   vitamin C 500 MG tablet Commonly known as: ASCORBIC ACID Take 500 mg by mouth daily.               Discharge Care Instructions  (From admission, onward)           Start     Ordered   04/14/21 0000  Discharge wound care:       Comments: Pressure off loading   04/14/21 0916           No Known Allergies  Follow-up Information     Wardell Honour, FNP Follow up.   Specialty: Family Medicine Why: as needed Contact information: [address] [place] [address] [phone]         Iran Ouch, MD Follow up.   Specialty: Cardiology Why: for zio patch result Contact information: [address] [place] [address] [phone]                  The results of significant diagnostics from this hospitalization (including imaging, microbiology, ancillary and laboratory) are listed below for reference.    Significant Diagnostic Studies: CT HEAD WO CONTRAST ( )  Result Date: 04/07/2021 CLINICAL DATA:  Delirium.  Fall. EXAM: CT HEAD WITHOUT CONTRAST TECHNIQUE: Contiguous axial images were obtained from the base of the skull through the vertex without intravenous contrast. COMPARISON:  04/04/2021 FINDINGS: Brain: Confluent hypodensities throughout the cerebral white matter similar to the prior CT and are nonspecific but compatible with severe chronic small vessel ischemic disease. There is also heterogeneous hypodensity in the thalami and left lentiform nucleus with the latter potentially having increased from the prior CT. There is hypodensity involving cortex and subcortical white matter in the left occipital lobe with milder changes in the right occipital lobe. There is also a 1 cm rounded  hypodensity in the left cerebellar hemisphere which could reflect an infarct or artifact and which was not clearly present on the prior CT. No acute intracranial hemorrhage, mass, midline shift, or extra-axial fluid collection is identified. Mild ventriculomegaly is unchanged and favored to reflect central predominant cerebral atrophy. Vascular: Calcified atherosclerosis at the skull base. No hyperdense vessel. Skull: No fracture or suspicious osseous lesion. Sinuses/Orbits: Paranasal sinuses and mastoid air cells are clear. Unremarkable orbits. Other: None. IMPRESSION: 1. Cortical and subcortical hypodensities in the left greater than right occipital lobes which are indeterminate but could reflect recent infarcts or posterior reversible encephalopathy syndrome (PRES). Possible involvement of the left basal ganglia and left cerebellum as well. An MRI is recommended for further evaluation when the patient's clinical status allows. 2. Severe chronic small vessel ischemic disease. Electronically Signed   By: Sebastian Ache M.D.   On: 04/07/2021 15:58   CT HEAD WO CONTRAST ( )  Result Date: 04/04/2021 CLINICAL DATA:  Mental status change.  Fall this morning. EXAM: CT HEAD WITHOUT CONTRAST CT CERVICAL SPINE WITHOUT CONTRAST TECHNIQUE: Multidetector CT imaging of the head and cervical spine was performed following the standard protocol without intravenous contrast. Multiplanar CT image reconstructions of the cervical spine were also generated. COMPARISON:  None. FINDINGS: CT HEAD FINDINGS Diminished exam detail secondary to motion artifact. Brain: No evidence of acute infarction, hemorrhage, hydrocephalus, extra-axial collection or mass lesion/mass effect. There is moderate to marked diffuse low-attenuation within the subcortical and periventricular white matter compatible with chronic microvascular disease. Remote bilateral basal ganglia lacunar infarcts. Vascular: No hyperdense vessel or unexpected calcification.  Skull: Normal. Negative for fracture or focal lesion. Sinuses/Orbits: No acute finding. Other: None CT CERVICAL SPINE FINDINGS Diminished exam detail due to motion artifact. Alignment: Straightening of normal cervical lordosis. Anterolisthesis of C2 on C3 and C3 on C4 identified, likely due to chronic spondylosis. Skull base and vertebrae: The vertebral body heights are well preserved. No signs of acute fracture or dislocation. Soft tissues and spinal canal: No prevertebral fluid or swelling. No visible canal hematoma. Disc levels: Multilevel disc space narrowing and endplate spurring is identified. This is most advanced at C3-4, C4-5 and C5-6. Upper chest: Extensive  chronic lung disease identified. There is a 9 mm subpleural nodule in the posteromedial right upper lobe, image 79/10. Not significantly changed when compared with 10/05/2020. Other: None IMPRESSION: 1. Moderately diminished exam detail secondary to excessive motion artifact. Within this limitation there are no acute intracranial abnormalities. 2. Chronic small vessel ischemic change and brain atrophy. 3. Evaluation of the cervical spine is also limited due to motion artifact. No signs of acute cervical spine fracture. 4. Advanced cervical spondylosis. 5. There is a 9 mm subpleural nodule in the posteromedial right upper lobe. Unchanged when compared with 10/05/2020. Electronically Signed   By: Signa Kell M.D.   On: 04/04/2021 17:03   CT Cervical Spine Wo Contrast  Result Date: 04/04/2021 CLINICAL DATA:  Mental status change.  Fall this morning. EXAM: CT HEAD WITHOUT CONTRAST CT CERVICAL SPINE WITHOUT CONTRAST TECHNIQUE: Multidetector CT imaging of the head and cervical spine was performed following the standard protocol without intravenous contrast. Multiplanar CT image reconstructions of the cervical spine were also generated. COMPARISON:  None. FINDINGS: CT HEAD FINDINGS Diminished exam detail secondary to motion artifact. Brain: No evidence  of acute infarction, hemorrhage, hydrocephalus, extra-axial collection or mass lesion/mass effect. There is moderate to marked diffuse low-attenuation within the subcortical and periventricular white matter compatible with chronic microvascular disease. Remote bilateral basal ganglia lacunar infarcts. Vascular: No hyperdense vessel or unexpected calcification. Skull: Normal. Negative for fracture or focal lesion. Sinuses/Orbits: No acute finding. Other: None CT CERVICAL SPINE FINDINGS Diminished exam detail due to motion artifact. Alignment: Straightening of normal cervical lordosis. Anterolisthesis of C2 on C3 and C3 on C4 identified, likely due to chronic spondylosis. Skull base and vertebrae: The vertebral body heights are well preserved. No signs of acute fracture or dislocation. Soft tissues and spinal canal: No prevertebral fluid or swelling. No visible canal hematoma. Disc levels: Multilevel disc space narrowing and endplate spurring is identified. This is most advanced at C3-4, C4-5 and C5-6. Upper chest: Extensive chronic lung disease identified. There is a 9 mm subpleural nodule in the posteromedial right upper lobe, image 79/10. Not significantly changed when compared with 10/05/2020. Other: None IMPRESSION: 1. Moderately diminished exam detail secondary to excessive motion artifact. Within this limitation there are no acute intracranial abnormalities. 2. Chronic small vessel ischemic change and brain atrophy. 3. Evaluation of the cervical spine is also limited due to motion artifact. No signs of acute cervical spine fracture. 4. Advanced cervical spondylosis. 5. There is a 9 mm subpleural nodule in the posteromedial right upper lobe. Unchanged when compared with 10/05/2020. Electronically Signed   By: Signa Kell M.D.   On: 04/04/2021 17:03   MR ANGIO HEAD WO CONTRAST  Result Date: 04/08/2021 CLINICAL DATA:  87 year old female with abnormal but nonspecific hemispheric hypodensity on head CT  yesterday for delirium. Acute left MCA/PCA watershed area ischemia. EXAM: MRA HEAD WITHOUT CONTRAST TECHNIQUE: Angiographic images of the Circle of Willis were acquired using MRA technique without intravenous contrast. COMPARISON:  Noncontrast brain MRI and MRA neck without and with contrast today reported separately. FINDINGS: Intermittently degraded by motion. Antegrade flow in the posterior circulation. Fairly codominant distal vertebral arteries. Both distal vertebral arteries appear irregular, but patent to the vertebrobasilar junction and without high-grade stenosis. Both PICA origins are patent. Patent basilar artery with mid basilar artery irregularity and mild stenosis. SCA and right PCA origins are patent. There is a fetal type left PCA origin. The right posterior communicating artery is diminutive or absent. Visible right PCA branches are within  normal limits. There is moderate irregularity and stenosis of the left PCA P2 segment (series 1066, image 15) but preserved distal left PCA flow signal. Antegrade flow in both ICA siphons. Limited cavernous sinus siphon detail due to motion. Distal ICAs and carotid termini are patent. MCA and ACA origins are patent. Limited ACA detail at the anterior communicating and proximal A2 segments due to motion, but those segments appear grossly normal on the postcontrast neck MRA series today. And the distal A2 segments appear patent. Better detail of the MCA M1 segments and MCA bifurcations which are patent. However, MCA branch detail is degraded by motion. No proximal M2 branch occlusion is evident. IMPRESSION: 1. Intermittently motion degraded. Negative for large vessel occlusion. Positive for moderate stenosis of the Left PCA P2 segment, and no fetal type origin of the left PCA. 2. Evidence of distal vertebral artery and basilar artery atherosclerosis without significant stenosis. Electronically Signed   By: Odessa Fleming M.D.   On: 04/08/2021 11:34   MR ANGIO NECK W WO  CONTRAST  Result Date: 04/08/2021 CLINICAL DATA:  87 year old female with abnormal but nonspecific hemispheric hypodensity on head CT yesterday for delirium. Acute left MCA/PCA watershed area ischemia. EXAM: MRA NECK WITHOUT AND WITH CONTRAST TECHNIQUE: Multiplanar and multiecho pulse sequences of the neck were obtained without and with intravenous contrast. Angiographic images of the neck were obtained using MRA technique without and with intravenous contrast. CONTRAST:  7.54mL GADAVIST GADOBUTROL 1 MMOL/ML IV SOLN COMPARISON:  Brain MRI today reported separately. FINDINGS: Precontrast time-of-flight images demonstrate antegrade flow signal in the bilateral cervical carotid and vertebral arteries. Antegrade flow continues toward the skull base. Following contrast a 3 vessel arch configuration is demonstrated, although the postcontrast portion of the exam is mildly degraded by motion and also venous contamination. Arch and great vessel origins are within normal limits. Tortuous right CCA. Patent right carotid bifurcation with only mild irregularity at the right ICA origin. No cervical right ICA stenosis. Tortuous left CCA. Patent left carotid bifurcation with mild irregularity of the proximal left ICA, no significant stenosis. Proximal subclavian arteries appear patent without stenosis. Neither vertebral artery origin is well demonstrated due to motion artifact. But there is symmetric appearing enhancement of both vertebral arteries in the neck and to the vertebrobasilar junction. Distally the left vertebral artery appears to be dominant. IMPRESSION: No evidence of hemodynamically significant cervical carotid or vertebral artery stenosis. Electronically Signed   By: Odessa Fleming M.D.   On: 04/08/2021 11:29   MR BRAIN WO CONTRAST  Result Date: 04/08/2021 CLINICAL DATA:  87 year old female with abnormal but nonspecific hemispheric hypodensity on head CT yesterday for delirium. Questioned ischemia versus posterior  reversible encephalopathy syndrome (PRES). EXAM: MRI HEAD WITHOUT CONTRAST TECHNIQUE: Multiplanar, multiecho pulse sequences of the brain and surrounding structures were obtained without intravenous contrast. COMPARISON:  Head CT 04/07/2021 and earlier. FINDINGS: Brain: Patchy restricted diffusion in the inferior left parietal lobe and occipital lobe corresponds to left MCA/PCA watershed territory. An area of 4-5 cm is affected. There is associated T2 and FLAIR hyperintensity there compatible with cytotoxic edema. But no other convincing cortical edema. No other restricted diffusion. Confluent bilateral cerebral white matter T2 and FLAIR hyperintensity with bilateral deep white matter capsule involvement. Left greater than right T2 and FLAIR heterogeneity in the bilateral deep gray matter nuclei. Moderate T2 and FLAIR heterogeneity throughout the pons. Small chronic lacunar infarct of the right cerebellum on series 7, image 5. No chronic cortical encephalomalacia identified. No midline shift,  mass effect, evidence of mass lesion, ventriculomegaly, extra-axial collection or acute intracranial hemorrhage. Cervicomedullary junction and pituitary are within normal limits. Vascular: Major intracranial vascular flow voids are preserved. MRA today is reported separately. Skull and upper cervical spine: Partially visible cervical spine degeneration. Visualized bone marrow signal is within normal limits. Sinuses/Orbits: Negative. Paranasal sinuses and mastoids are stable and well aerated. Other: Negative visible scalp and face. IMPRESSION: 1. Patchy acute infarcts in the Left MCA/PCA watershed territory. No associated hemorrhage or mass effect. 2. No other acute intracranial abnormality. Advanced chronic signal changes in the bilateral cerebral white matter, deep gray nuclei, and pons. This is nonspecific but favor due to chronic small vessel disease. 3. MRA today is reported separately. Electronically Signed   By: Odessa Fleming  M.D.   On: 04/08/2021 11:26   CT CHEST ABDOMEN PELVIS W CONTRAST  Addendum Date: 04/04/2021   ADDENDUM REPORT: 04/04/2021 21:13 ADDENDUM: Should be noted the proximal and mid colon show decompression with some wall edema which may represent mild colitis. Electronically Signed   By: Alcide Clever M.D.   On: 04/04/2021 21:13   Result Date: 04/04/2021 CLINICAL DATA:  Chronic dyspnea and abdominal distension following recent fall, initial encounter EXAM: CT CHEST, ABDOMEN, AND PELVIS WITH CONTRAST TECHNIQUE: Multidetector CT imaging of the chest, abdomen and pelvis was performed following the standard protocol during bolus administration of intravenous contrast. CONTRAST:  80mL OMNIPAQUE IOHEXOL 350 MG/ML SOLN COMPARISON:  01/21/2021, 01/20/2021 FINDINGS: CT CHEST FINDINGS Cardiovascular: Atherosclerotic calcifications of the thoracic aorta are noted. No aneurysmal dilatation or dissection is noted. Mitral valve calcifications are seen as well as coronary calcifications. No cardiac enlargement is seen. Pulmonary artery as visualized shows no embolus although not timed for embolus evaluation. Mediastinum/Nodes: Thoracic inlet is within normal limits. Stable appearing mediastinal and hilar lymph nodes are noted similar to that seen on prior exam from 01/20/2021. The esophagus as visualized is within normal limits. Lungs/Pleura: Subpleural fibrotic changes are again identified similar to that seen on prior CT examination. No acute infiltrate or sizable effusion is noted. No sizable parenchymal nodules are noted. Musculoskeletal: Old rib fractures are noted on the right. No definitive acute rib fractures are seen. Degenerative changes of the thoracic spine are noted. No compression deformity is seen. CT ABDOMEN PELVIS FINDINGS Hepatobiliary: Liver is diffusely fatty infiltrated. Gallbladder demonstrates multiple gallstones within. Pancreas: Unremarkable. No pancreatic ductal dilatation or surrounding inflammatory  changes. Spleen: Scattered calcified granulomas are noted within the spleen stable from prior CT. Adrenals/Urinary Tract: Adrenal glands are within normal limits. Right kidney is somewhat shrunken progressed when compared with the prior exam which may be related to the aortic stent graft placement. No renal calculi are seen. No obstructive changes are noted. Stable lesion arising from the lower pole of the left kidney is again seen likely representing complex cyst. The bladder is decompressed by Foley catheter. Stomach/Bowel: Scattered diverticular change of the colon is noted without evidence of diverticulitis. The ascending, transverse and proximal descending colons are decompressed with some mild wall edema suggestive of underlying colitis. The small bowel and stomach appear within normal limits. Vascular/Lymphatic: Abdominal aortic aneurysm is again seen. There is been interval stent graft therapy performed which is widely patent. No evidence of endoleak is noted. The maximum aneurysmal sac diameter is 7.9 cm relatively stable from the prior exam. Left renal artery stent appears patent. The right renal artery stent is not well opacified and may be occluded distally which would account for the shrunken right  kidney when compared with the prior exam. Changes of prior embolization of the right internal iliac artery are seen. Stable right common iliac artery aneurysms is noted but excluded by the stent graft. Reproductive: Uterus and bilateral adnexa are unremarkable. Other: No abdominal wall hernia or abnormality. No abdominopelvic ascites. Musculoskeletal: Postsurgical changes in the proximal left femur are seen. Degenerative changes of lumbar spine are seen. No compression deformity is noted. IMPRESSION: CT of the chest: Stable mediastinal and hilar lymph nodes when compare with the prior exam. 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 CT of the abdomen and pelvis: Fatty infiltration of the  liver. Interval exclusion of the abdominal aortic aneurysm by stent graft therapy with embolization of the right internal iliac artery and stenting of both renal arteries. The right renal artery stent does not appear to opacify satisfactorily and would account for the right renal atrophy when compared with the prior exam. Aneurysm sac is stable from the prior exam without endoleak. Diverticulosis without diverticulitis. Stable cystic lesion arising from the lower pole of the left kidney. Nonemergent MRI is again recommended for further evaluation as previously described. Stable cholelithiasis. Electronically Signed: By: Alcide Clever M.D. On: 04/04/2021 20:50   DG Chest Portable 1 View  Result Date: 04/04/2021 CLINICAL DATA:  Hypoxia.  Altered mental status. EXAM: PORTABLE CHEST 1 VIEW COMPARISON:  Radiograph 01/24/2021 chest CT 01/20/2009 FINDINGS: Stable borderline cardiomegaly. Aortic atherosclerosis and tortuosity. Chronic interstitial thickening in a peripheral predominant distribution. No superimposed acute airspace disease. No pleural effusion or pneumothorax. Abdominal aortic stent graft is partially visualized. No acute osseous abnormalities are seen. IMPRESSION: Chronic interstitial thickening, suspicious for interstitial lung disease. No superimposed acute process. Electronically Signed   By: Narda Rutherford M.D.   On: 04/04/2021 17:01   EEG adult  Result Date: 04/09/2021 Rejeana Brock, MD     04/10/2021  9:30 AM History: 87 yo F with AMS Sedation: None Technique: This EEG was acquired with electrodes placed according to the International 10-20 electrode system (including Fp1, Fp2, F3, F4, C3, C4, P3, P4, O1, O2, T3, T4, T5, T6, A1, A2, Fz, Cz, Pz). The following electrodes were missing or displaced: none. Background: There is a posterior dominant rhythm of 9 to 10 Hz as well as frontally predominant beta activity in a normal distribution.  In addition, there is generalized irregular delta  and theta range activities that intrude into the background throughout the study. Photic stimulation: Physiologic driving is present EEG Abnormalities: 1) generalized irregular slow activity Clinical Interpretation: This EEG is consistent with a mild generalized nonspecific cerebral dysfunction (encephalopathy). There was no seizure or seizure predisposition recorded on this study. Please note that lack of epileptiform activity on EEG does not preclude the possibility of epilepsy. Ritta Slot, MD [facility] [phone] If 7pm- 7am, please page neurology on call as listed in AMION.   VAS [place] EVAR DUPLEX  Result Date: 03/20/2021 Endovascular Aortic Repair Study (EVAR) Patient Name:  TWANA WILEMAN  Date of Exam:   03/16/2021 Medical Rec #: [id number]       Accession #:    [id number] Date of Birth: 09-12-1933       Patient Gender: F Patient Age:   87 years Exam Location:  [facility] Procedure:      VAS [place] EVAR DUPLEX Referring Phys: Festus Barren --------------------------------------------------------------------------------  Indications: Follow up exam for EVAR. Vascular Interventions: EVAR on 01/24/21. Limitations: Overlying bowel gas / exam performed upright in a wheelchair due  to immobility.  Performing Technologist: Jamse MeadWilliam Hosmer RT, RDMS, RVT  Examination Guidelines: A complete evaluation includes B-mode imaging, spectral Doppler, color Doppler, and power Doppler as needed of all accessible portions of each vessel. Bilateral testing is considered an integral part of a complete examination. Limited examinations for reoccurring indications may be performed as noted.  Endovascular Aortic Repair (EVAR): +------------+---------------+---------------+-----------------+---------------+             Diameter AP    Diameter Trans Velocities       Comments                    (cm)           (cm)           (cm/sec)                          +------------+---------------+---------------+-----------------+---------------+ Distal aorta7.58           7.62           39               Prox to mid                                                                aorta not                                                                  visualized      +------------+---------------+---------------+-----------------+---------------+ Right CIA                                                  not visualized  +------------+---------------+---------------+-----------------+---------------+ Left CIA                                                   not visualized  +------------+---------------+---------------+-----------------+---------------+  Summary: There is evidence of abnormal dilatation of the distal Abdominal aorta. Patent endovascular aneurysm repair with no evidence of endoleak, based on limited visualization, as described above. The largest aortic diameter remains essentially unchanged compared to prior exam. Previous diameter measurement was 7.8 cm obtained on 01/21/21 by CT.  *See table(s) above for measurements and observations.  Electronically signed by Festus BarrenJason Dew MD on 03/20/2021 at 10:17:19 AM.   Final    ECHOCARDIOGRAM COMPLETE  Result Date: 04/09/2021    ECHOCARDIOGRAM REPORT   Patient Name:   Reatha ArmourSYLVIA Stmartin Date of Exam: 04/09/2021 Medical Rec #:  [id number]      Height:       65.0 in Accession #:    [id number]     Weight:       193.1 lb Date of Birth:  [birth year]      BSA:  1.949 m Patient Age:    87 years       BP:           154/93 mmHg Patient Gender: F              HR:           107 bpm. Exam Location:  [facility] Procedure: 2D Echo, Color Doppler and Cardiac Doppler Indications:     I63.9 Stroke  History:         Patient has prior history of Echocardiogram examinations, most                  recent 01/29/2021. CAD, Aortic Valve Disease; Risk                  Factors:Dyslipidemia.  Sonographer:     Humphrey Rolls  Referring Phys:  [id number] ASHISH ARORA Diagnosing Phys: Lorine Bears MD  Sonographer Comments: Suboptimal subcostal window. IMPRESSIONS  1. Left ventricular ejection fraction, by estimation, is 30 to 35%. The left ventricle has moderately decreased function. The left ventricle demonstrates global hypokinesis. There is mild left ventricular hypertrophy. Left ventricular diastolic parameters are indeterminate.  2. Right ventricular systolic function is normal. The right ventricular size is normal. Tricuspid regurgitation signal is inadequate for assessing PA pressure.  3. Left atrial size was mildly dilated.  4. The mitral valve is normal in structure. Mild mitral valve regurgitation. No evidence of mitral stenosis. Severe mitral annular calcification.  5. The aortic valve is abnormal. There is severe calcifcation of the aortic valve. There is severe thickening of the aortic valve. Aortic valve regurgitation is not visualized. Severe aortic valve stenosis. Aortic valve area, by VTI measures 0.49 cm. Aortic valve mean gradient measures 36.0 mmHg.  6. The inferior vena cava is normal in size with greater than 50% respiratory variability, suggesting right atrial pressure of 3 mmHg. FINDINGS  Left Ventricle: Left ventricular ejection fraction, by estimation, is 30 to 35%. The left ventricle has moderately decreased function. The left ventricle demonstrates global hypokinesis. The left ventricular internal cavity size was normal in size. There is mild left ventricular hypertrophy. Left ventricular diastolic parameters are indeterminate. Right Ventricle: The right ventricular size is normal. No increase in right ventricular wall thickness. Right ventricular systolic function is normal. Tricuspid regurgitation signal is inadequate for assessing PA pressure. Left Atrium: Left atrial size was mildly dilated. Right Atrium: Right atrial size was normal in size. Pericardium: There is no evidence of pericardial effusion. Mitral  Valve: The mitral valve is normal in structure. Severe mitral annular calcification. Mild mitral valve regurgitation. No evidence of mitral valve stenosis. MV peak gradient, 14.1 mmHg. The mean mitral valve gradient is 6.0 mmHg. Tricuspid Valve: The tricuspid valve is normal in structure. Tricuspid valve regurgitation is mild . No evidence of tricuspid stenosis. Aortic Valve: The aortic valve is abnormal. There is severe calcifcation of the aortic valve. There is severe thickening of the aortic valve. Aortic valve regurgitation is not visualized. Severe aortic stenosis is present. Aortic valve mean gradient measures 36.0 mmHg. Aortic valve peak gradient measures 55.1 mmHg. Aortic valve area, by VTI measures 0.49 cm. Pulmonic Valve: The pulmonic valve was normal in structure. Pulmonic valve regurgitation is not visualized. No evidence of pulmonic stenosis. Aorta: The aortic root is normal in size and structure. Venous: The inferior vena cava is normal in size with greater than 50% respiratory variability, suggesting right atrial pressure of 3 mmHg. IAS/Shunts: No atrial level shunt detected by  color flow Doppler.  LEFT VENTRICLE PLAX 2D LVIDd:         3.90 cm     Diastology LVIDs:         3.40 cm     LV e' medial:    8.70 cm/s LV PW:         1.10 cm     LV E/e' medial:  20.3 LV IVS:        0.80 cm     LV e' lateral:   12.80 cm/s LVOT diam:     1.90 cm     LV E/e' lateral: 13.8 LV SV:         36 LV SV Index:   19 LVOT Area:     2.84 cm  LV Volumes (MOD) LV vol d, MOD A2C: 84.4 ml LV vol d, MOD A4C: 93.4 ml LV vol s, MOD A2C: 62.5 ml LV vol s, MOD A4C: 52.9 ml LV SV MOD A2C:     21.9 ml LV SV MOD A4C:     93.4 ml LV SV MOD BP:      32.1 ml LEFT ATRIUM             Index LA diam:        4.10 cm 2.10 cm/m LA Vol (A2C):   69.5 ml 35.66 ml/m LA Vol (A4C):   65.7 ml 33.71 ml/m LA Biplane Vol: 73.0 ml 37.46 ml/m  AORTIC VALVE                    PULMONIC VALVE AV Area (Vmax):    0.58 cm     PV Vmax:       0.76 m/s AV  Area (Vmean):   0.50 cm     PV Vmean:      52.800 cm/s AV Area (VTI):     0.49 cm     PV VTI:        0.110 m AV Vmax:           371.00 cm/s  PV Peak grad:  2.3 mmHg AV Vmean:          287.000 cm/s PV Mean grad:  1.0 mmHg AV VTI:            0.737 m AV Peak Grad:      55.1 mmHg AV Mean Grad:      36.0 mmHg LVOT Vmax:         75.40 cm/s LVOT Vmean:        50.200 cm/s LVOT VTI:          0.128 m LVOT/AV VTI ratio: 0.17  AORTA Ao Root diam: 3.20 cm MITRAL VALVE MV Area (PHT): 5.02 cm     SHUNTS MV Area VTI:   1.28 cm     Systemic VTI:  0.13 m MV Peak grad:  14.1 mmHg    Systemic Diam: 1.90 cm MV Mean grad:  6.0 mmHg MV Vmax:       1.88 m/s MV Vmean:      107.0 cm/s MV Decel Time: 151 msec MV E velocity: 177.00 cm/s Lorine Bears MD Electronically signed by Lorine Bears MD Signature Date/Time: 04/09/2021/1:51:27 PM    Final    US Abdomen Limited RUQ (LIVER/GB)  Result Date: 04/04/2021 CLINICAL DATA:  History of cholelithiasis EXAM: ULTRASOUND ABDOMEN LIMITED RIGHT UPPER QUADRANT COMPARISON:  CT from earlier in the same day. FINDINGS: Gallbladder: Gallbladder is well distended with evidence of cholelithiasis. No wall thickening  or pericholecystic fluid is noted. Negative sonographic Murphy's sign is elicited. Common bile duct: Diameter: 3.4 mm. Liver: Fatty infiltration of the liver is noted. No focal mass is seen. Portal vein is patent on color Doppler imaging with normal direction of blood flow towards the liver. Other: None. IMPRESSION: Cholelithiasis without complicating factors. Fatty liver similar to that seen on CT. Electronically Signed   By: Alcide Clever M.D.   On: 04/04/2021 21:43    Microbiology: Recent Results (from the past 240 hour(s))  MRSA Next Gen by PCR, Nasal     Status: None   Collection Time: 04/06/21  9:25 AM   Specimen: Nasal Mucosa; Nasal Swab  Result Value Ref Range Status   MRSA by PCR Next Gen NOT DETECTED NOT DETECTED Final    Comment: (NOTE) The GeneXpert MRSA Assay (FDA  approved for NASAL specimens only), is one component of a comprehensive MRSA colonization surveillance program. It is not intended to diagnose MRSA infection nor to guide or monitor treatment for MRSA infections. Test performance is not FDA approved in patients less than 16 years old. Performed at [facility], [address] [place], [place] [address]      Labs: Basic Metabolic Panel: Recent Labs  Lab 04/08/21 [phone] 04/10/21 0456 04/11/21 0526 04/12/21 0449 04/13/21 0419 04/14/21 0437  NA 142 140 135 133* 129* 134*  K 3.7 4.0 4.0 4.5 4.0 3.6  CL 112* 111 107 102 99 103  CO2 25 24 23 26 26 27   GLUCOSE 86 85 113* 79 96 87  BUN 15 12 13 13 13 12   CREATININE 0.76 0.71 0.72 0.73 0.72 0.71  CALCIUM 8.0* 8.2* 7.9* 8.2* 8.2* 8.2*  MG 1.9 1.9 1.8 1.7  --  1.9  PHOS 2.6 2.0* 2.9  --   --   --    Liver Function Tests: Recent Labs  Lab 04/13/21 0419  AST 31  ALT 19  ALKPHOS 93  BILITOT 1.3*  PROT 5.6*  ALBUMIN 2.2*   No results for input(s): LIPASE, AMYLASE in the last 168 hours. Recent Labs  Lab 04/08/21 0442  AMMONIA 21   CBC: Recent Labs  Lab 04/08/21 0442  WBC 8.5  NEUTROABS 5.8  HGB 11.8*  HCT 36.0  MCV 83.7  PLT 211   Cardiac Enzymes: No results for input(s): CKTOTAL, CKMB, CKMBINDEX, TROPONINI in the last 168 hours. BNP: BNP (last 3 results) Recent Labs    01/22/21 1640 01/31/21 1618 04/04/21 1608  BNP 146.7* 1,191.7* 297.8*    ProBNP (last 3 results) No results for input(s): PROBNP in the last 8760 hours.  CBG: No results for input(s): GLUCAP in the last 168 hours.     Signed:  02/02/21 MD, PhD, FACP  [facility] 04/14/2021, 7:01 PM

## 2021-04-14 NOTE — TOC Progression Note (Addendum)
Transition of Care [facility]) - Progression Note    Patient Details  Name: Monica [name] MRN: [id number] Date of Birth: [birth year]  Transition of Care [facility]) CM/SW Contact  Ashley Royalty Lutricia Feil, RN Phone Number:[phone] 04/14/2021, 9:21 AM  Clinical Narrative:    Arnaldo Natal to son [name]) who indicated DME has not been delivered yet. RN contacted [facility] Nida Boatman) who indicated afternoon delivery. Pt will be transported via EMS.  Will continue to follow up with another outreach to DME agency later today.   Expected Discharge Plan: Home w Hospice Care Barriers to Discharge: Continued Medical Work up  Expected Discharge Plan and Services Expected Discharge Plan: Home w Hospice Care   Discharge Planning Services: CM Consult Post Acute Care Choice: Hospice Living arrangements for the past 2 months: Single Family Home Expected Discharge Date: 04/14/21               DME Arranged: Wheelchair manual, Oxygen, Other see comment, Overbed table (hoyer lift) [facility]ME Agency[facility]  Date DME Agency Contacted: 04/13/21 Time DME Agency Contacted: 1620 Representative spoke with at DME Agency: Gwynneth Aliment with [facility] HH Arranged: NA HH Agency: NA Date HH Agency Contacted: 04/13/21 Time HH Agency Contacted: 1621 Representative spoke with at [facility] Agency: Hospice at home with [facility] - Gwynneth Aliment   Social Determinants of Health (SDOH) Interventions    Readmission Risk Interventions Readmission Risk Prevention Plan 04/07/2021  Transportation Screening Complete  PCP or Specialist Appt within 3-5 Days Complete  HRI or Home Care Consult Complete  Social Work Consult for Recovery Care Planning/Counseling Complete  Palliative Care Screening Not Applicable  Medication Review Oceanographer) Complete  Some recent data might be hidden

## 2021-04-14 NOTE — TOC Transition Note (Signed)
Transition of Care [facility]) - CM/SW Discharge Note   Patient Details  Name: Monica [name] MRN: [id number] Date of Birth: 1934/03/20  Transition of Care [facility]) CM/SW Contact:  Luvenia Redden, RN Phone Number: 04/14/2021, 12:54 PM   Clinical Narrative:    Sherron Monday with Brad via [facility] and confirmed all DME has been delivered to the pt's home. [facility] chosen for transportation and call for services today. Medical necessity and face-sheet available for EMS upon arrival. No other request at this time.  TOC will remains available for any other needs at this time.     Barriers to Discharge: Continued Medical Work up   Patient Goals and CMS Choice Patient states their goals for this hospitalization and ongoing recovery are:: Patient's son wants to see how she does and improves, likely goal to return home with home health CMS Medicare.gov Compare Post Acute Care list provided to:: Patient Represenative (must comment) Choice offered to / list presented to : Adult Children  Discharge Placement                       Discharge Plan and Services   Discharge Planning Services: CM Consult Post Acute Care Choice: Hospice          DME Arranged: Wheelchair manual, Oxygen, Other see comment, Overbed table (hoyer lift) DME Agency: [facility] [place] Date DME Agency Contacted: 04/13/21 Time DME Agency Contacted: 1620 Representative spoke with at DME Agency: [facility]wynn[facility] Aliment [facility] HH Arranged: NA HH Agency: NA Date HH Agency Contacted: 04/13/21 Time HH Agency Contacted: 1621 Representative spoke with at [facility] Agency: Hospice at home with [facility] - Gwynneth Aliment  Social Determinants of Health (SDOH) Interventions     Readmission Risk Interventions Readmission Risk Prevention Plan 04/07/2021  Transportation Screening Complete  PCP or Specialist Appt within 3-5 Days Complete  HRI or Home Care Consult Complete  Social Work Consult for Recovery  Care Planning/Counseling Complete  Palliative Care Screening Not Applicable  Medication Review Oceanographer) Complete  Some recent data might be hidden

## 2021-04-18 ENCOUNTER — Ambulatory Visit: Payer: Medicare Other | Admitting: Medical

## 2021-05-07 ENCOUNTER — Encounter (HOSPITAL_COMMUNITY): Payer: Self-pay | Admitting: Radiology

## 2021-05-12 DEATH — deceased

## 2021-06-15 ENCOUNTER — Ambulatory Visit (INDEPENDENT_AMBULATORY_CARE_PROVIDER_SITE_OTHER): Payer: Medicare Other | Admitting: Nurse Practitioner

## 2021-06-15 ENCOUNTER — Other Ambulatory Visit (INDEPENDENT_AMBULATORY_CARE_PROVIDER_SITE_OTHER): Payer: Medicare Other

## 2021-06-26 NOTE — Addendum Note (Signed)
Encounter addended by: Bryna Colander, RN on: 06/26/2021 4:27 PM  Actions taken: Imaging Exam begun

## 2021-10-08 IMAGING — CR DG HAND 2V*L*
1 series · 2 of 2 positions shown · non-contrast
Comparison: None.

CLINICAL DATA: Left hand pain after fall.

EXAM:
LEFT HAND - 2 VIEW

[Series 1: dg hand 2 view left · 0.14mm/px · 2 of 2 slices shown]
[im 1/2]
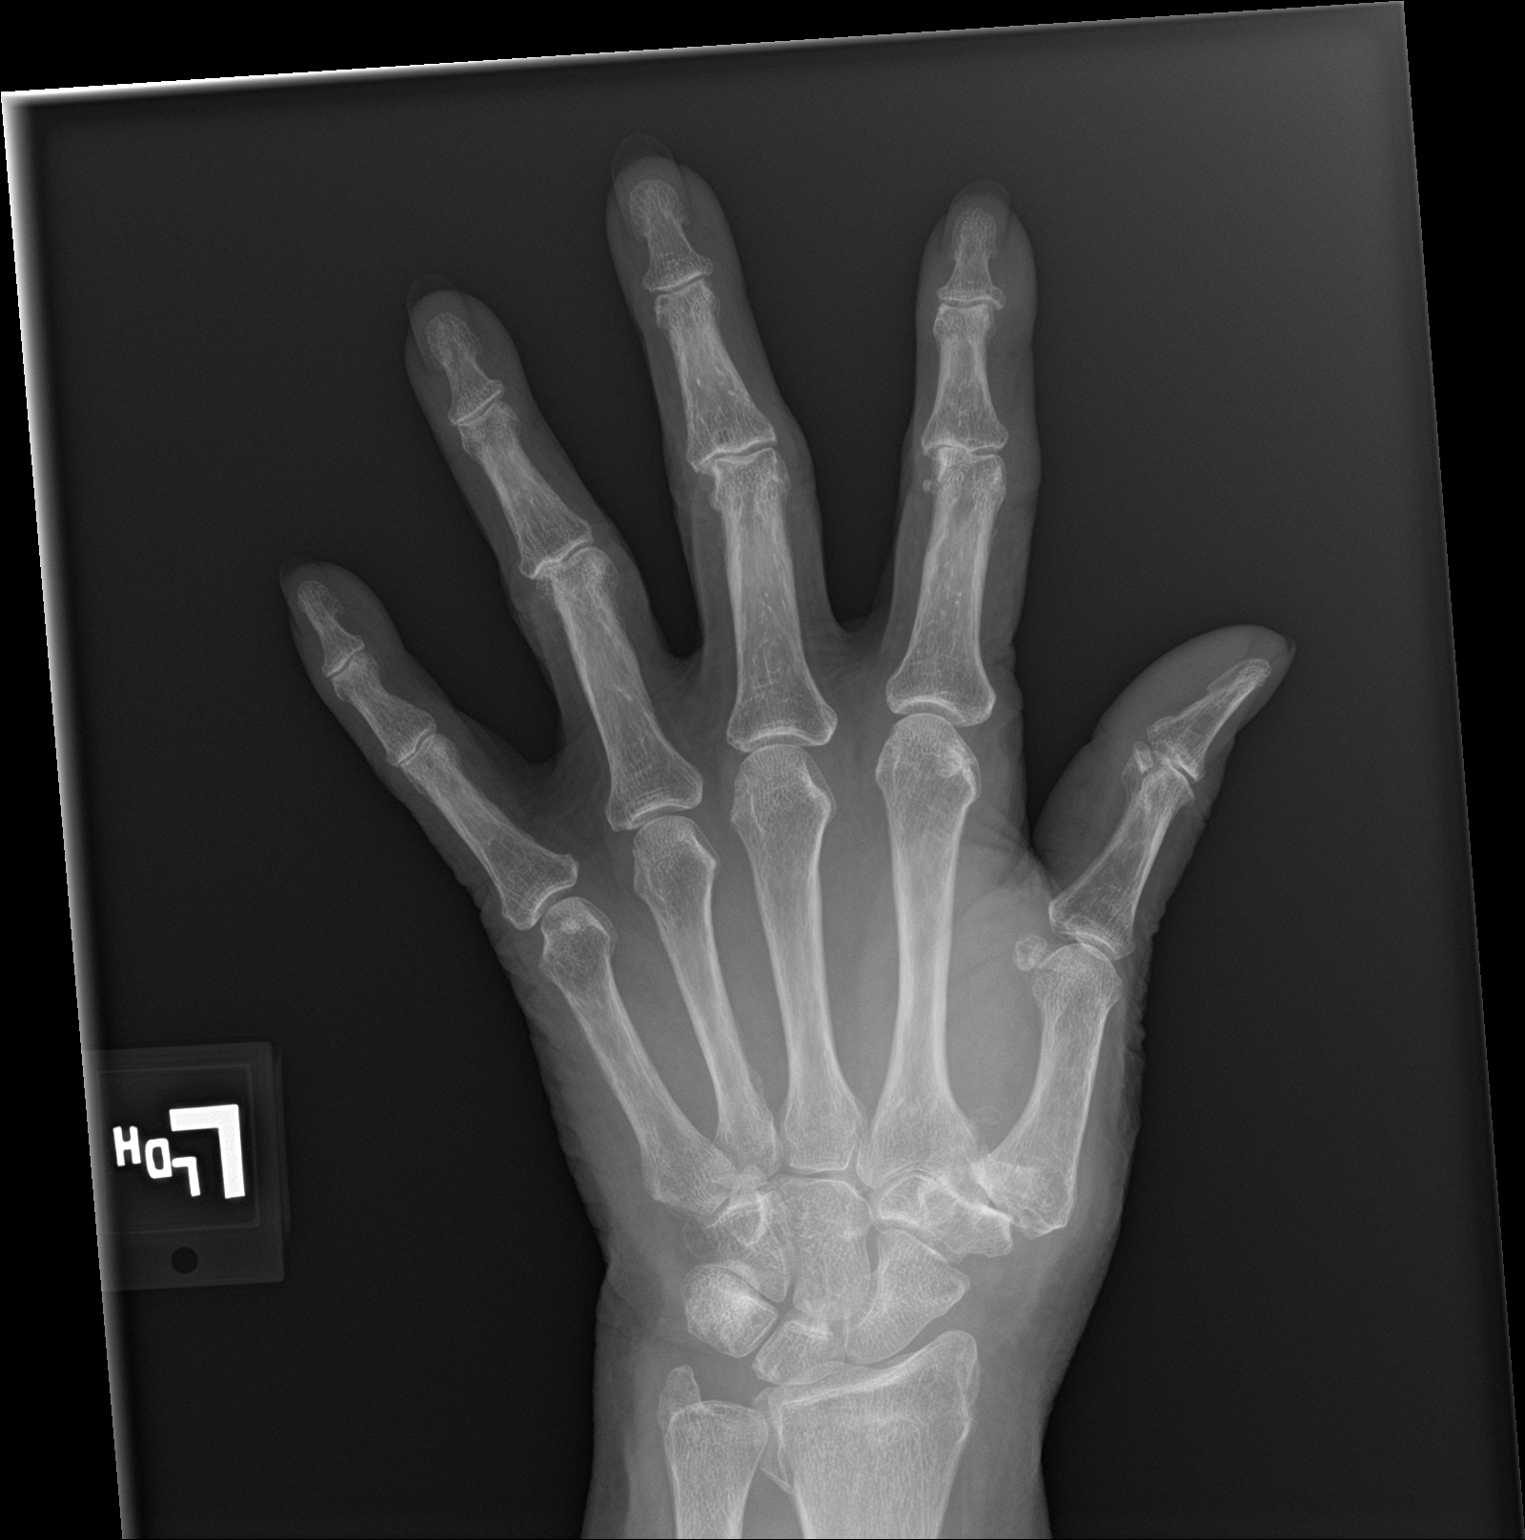
[im 2/2]
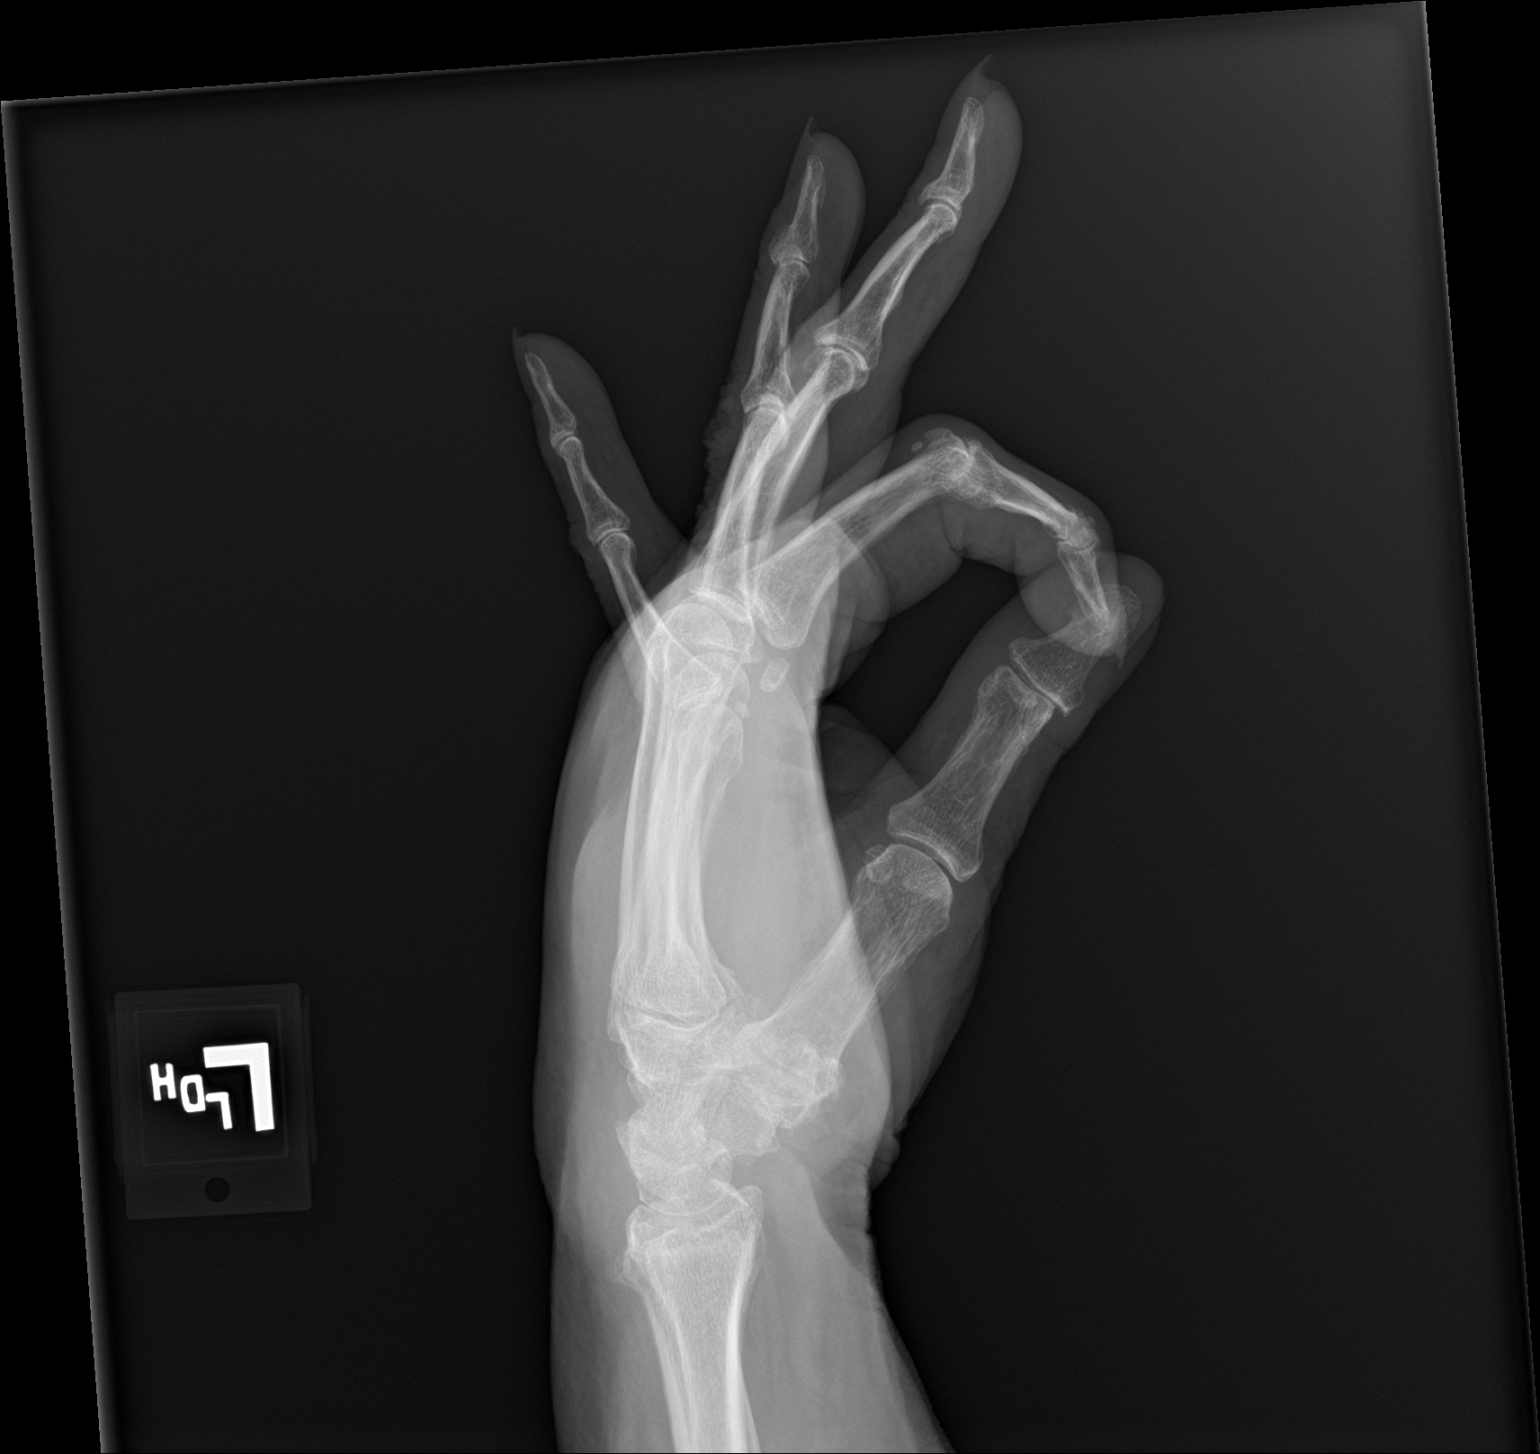

[2 of 2 positions shown; findings below may reference images not displayed]

FINDINGS: There is no evidence of fracture or dislocation. Moderate
degenerative changes seen involving the first carpometacarpal joint.
Soft tissues are unremarkable.
IMPRESSION: Moderate osteoarthritis of the first carpometacarpal joint. No acute
abnormality seen in the left hand.

## 2021-10-09 IMAGING — RF DG HIP (WITH PELVIS) OPERATIVE*L*
1 series · 6 of 6 positions shown · non-contrast
Comparison: October 02, 2020

CLINICAL DATA: Intramedullary nail on the LEFT.

EXAM:
OPERATIVE LEFT HIP (WITH PELVIS IF PERFORMED)  VIEWS
TECHNIQUE: Fluoroscopic spot image(s) were submitted for interpretation
post-operatively.

[Series 1: run · 6 of 6 slices shown]
[im 1/6]
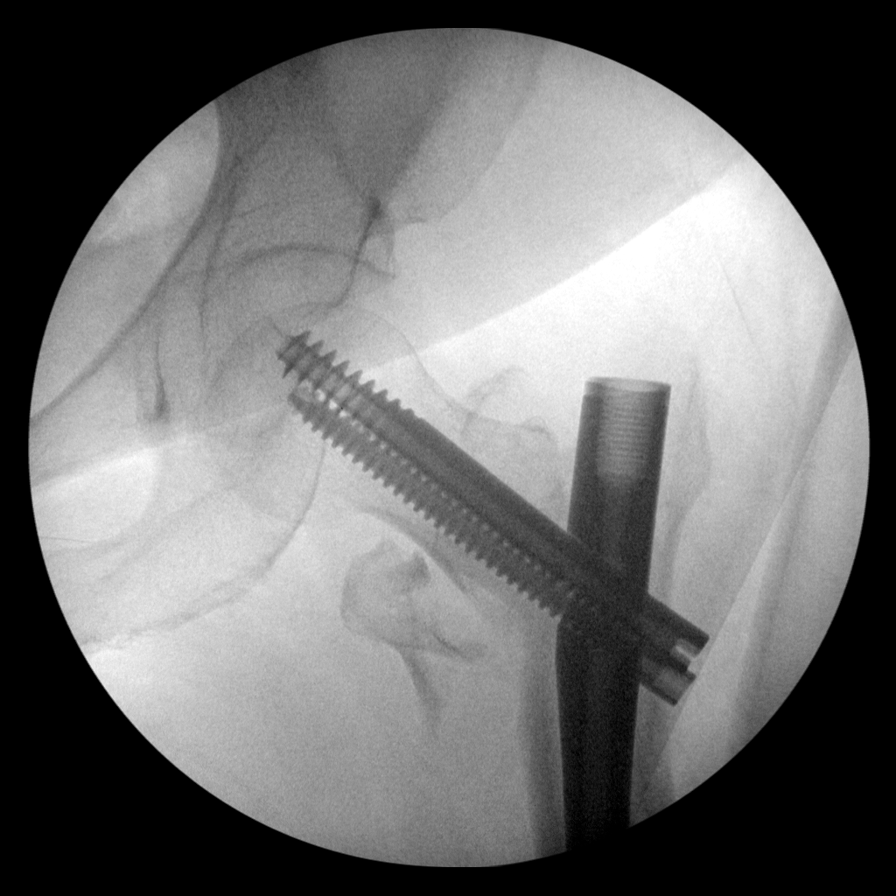
[im 2/6]
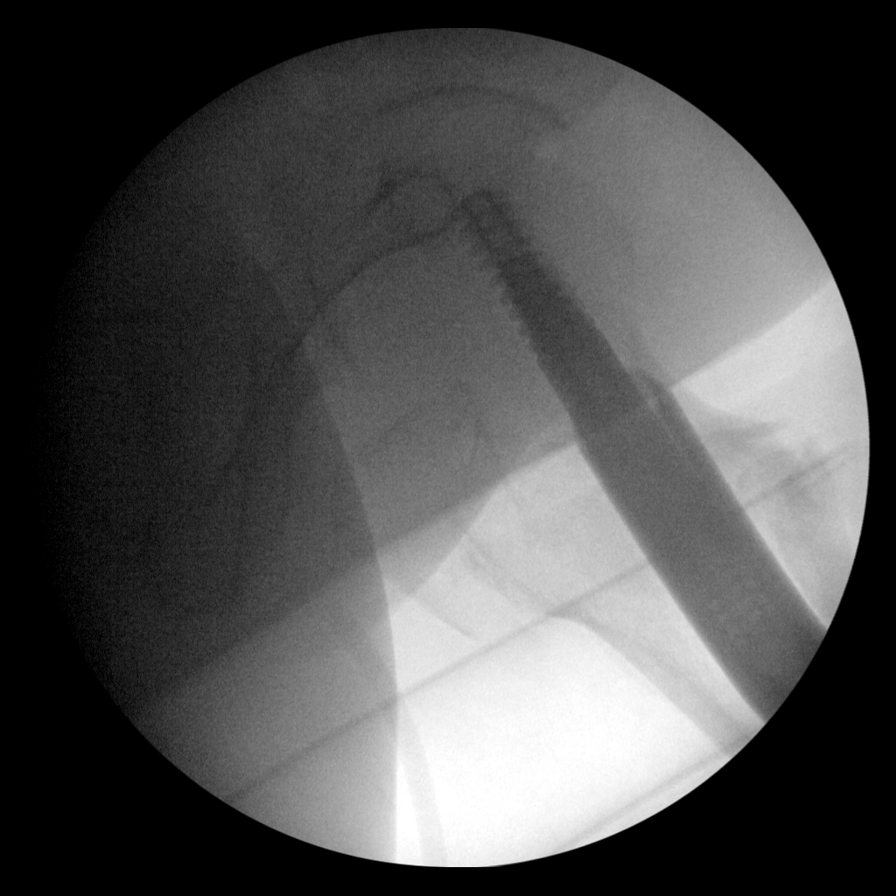
[im 3/6]
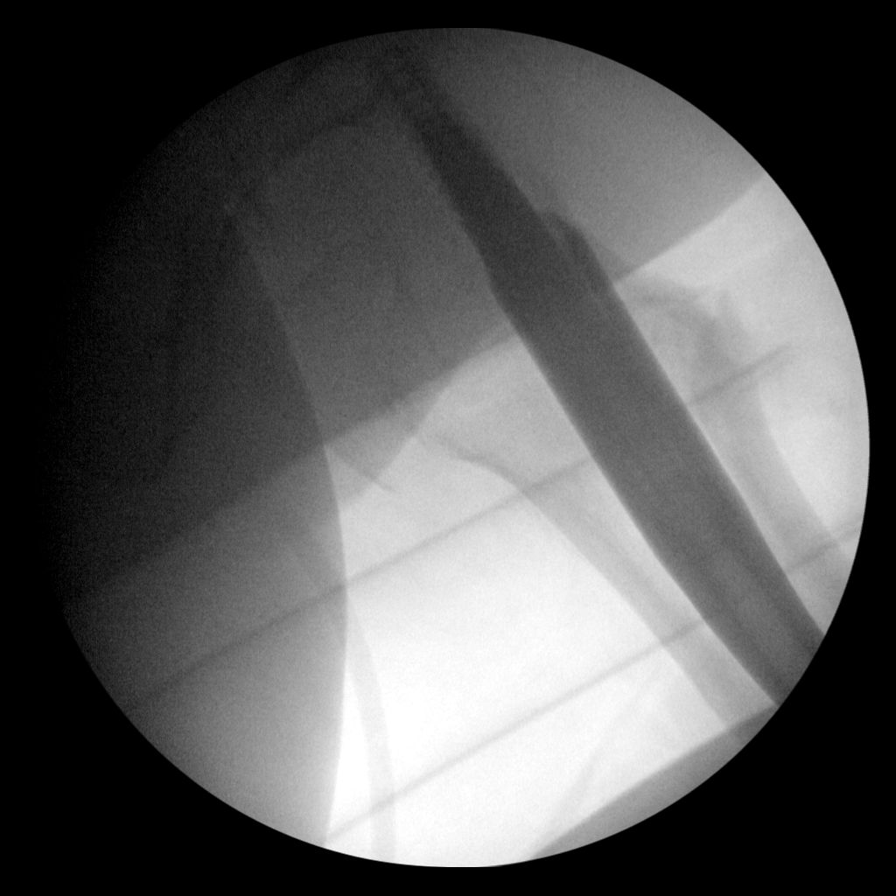
[im 4/6]
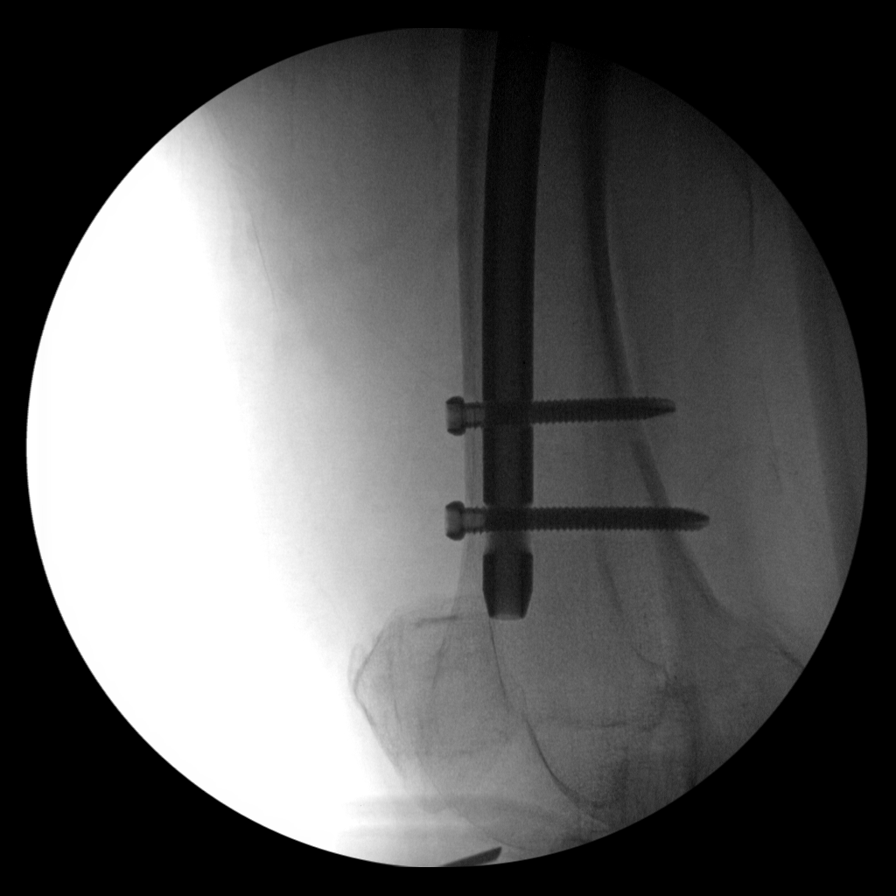
[im 5/6]
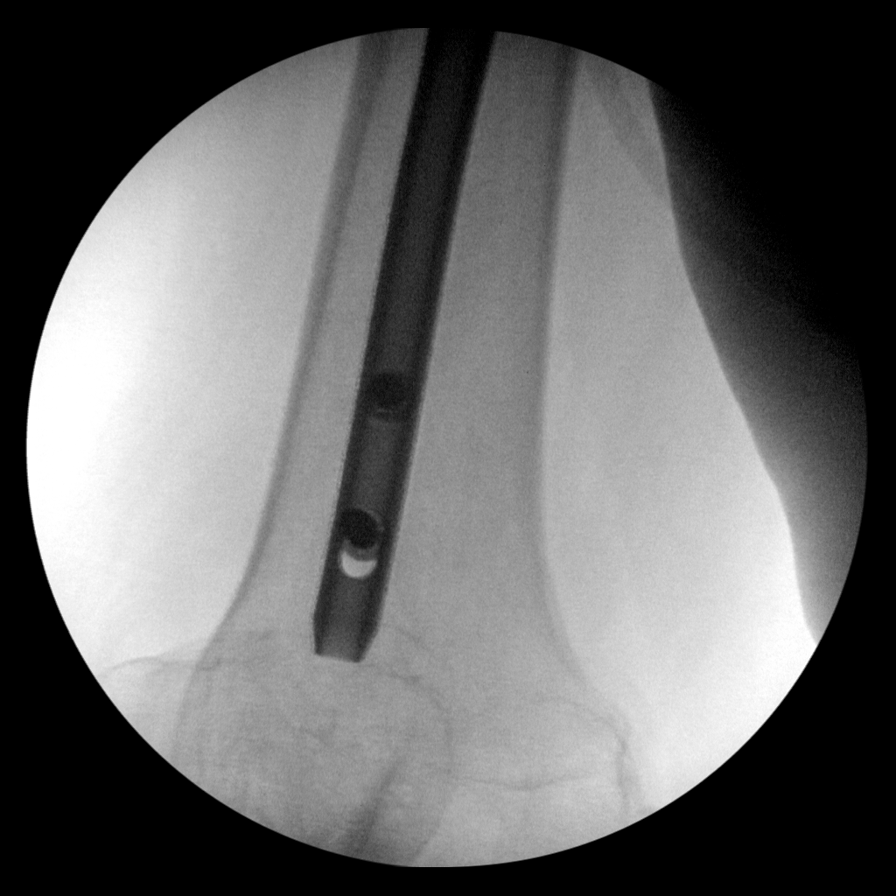
[im 6/6]
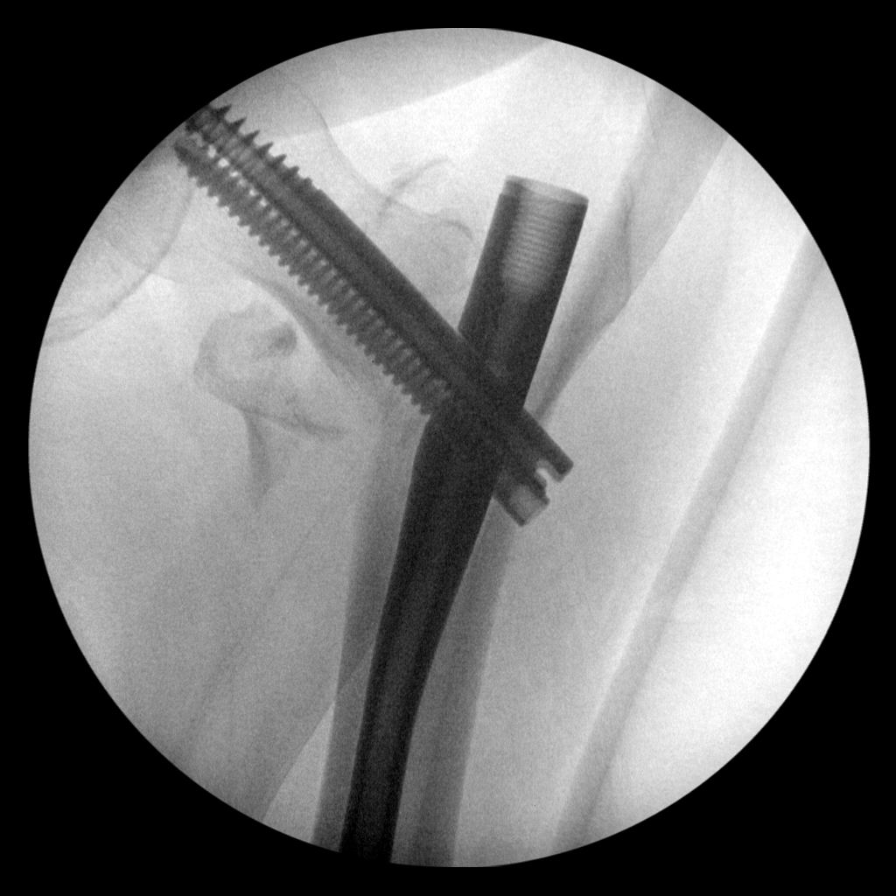

[6 of 6 positions shown; findings below may reference images not displayed]

FINDINGS: Six intraoperative fluoroscopic views with changes of intramedullary
rod placement and screw fixation across the femoral neck spanning a
comminuted intertrochanteric fracture of the LEFT proximal femur.
Midportion of the device is not imaged. Distal interlocking screws
of been placed in the distal femur within the metadiaphyseal region.

Proximally there is a compression type screw in a cannulated lag
screw in place across the femoral neck.

Persistent distraction of the lesser trochanteric fragment.
IMPRESSION: Post ORIF of LEFT intertrochanteric fracture with intramedullary
nailing and fixation devices as described.

Midportion of the devices are not imaged.  Attention on follow-up.
